# Patient Record
Sex: Female | Born: 1970
Health system: Southern US, Community
[De-identification: ages and names within clinical notes are randomized; demographics above are authoritative.]

## PROBLEM LIST (undated history)

## (undated) DIAGNOSIS — D332 Benign neoplasm of brain, unspecified: Secondary | ICD-10-CM

## (undated) DIAGNOSIS — I951 Orthostatic hypotension: Secondary | ICD-10-CM

## (undated) DIAGNOSIS — N920 Excessive and frequent menstruation with regular cycle: Secondary | ICD-10-CM

## (undated) DIAGNOSIS — G4700 Insomnia, unspecified: Secondary | ICD-10-CM

## (undated) DIAGNOSIS — E11319 Type 2 diabetes mellitus with unspecified diabetic retinopathy without macular edema: Secondary | ICD-10-CM

## (undated) DIAGNOSIS — M797 Fibromyalgia: Secondary | ICD-10-CM

## (undated) DIAGNOSIS — E118 Type 2 diabetes mellitus with unspecified complications: Secondary | ICD-10-CM

## (undated) DIAGNOSIS — E113593 Type 2 diabetes mellitus with proliferative diabetic retinopathy without macular edema, bilateral: Secondary | ICD-10-CM

## (undated) DIAGNOSIS — R140 Abdominal distension (gaseous): Secondary | ICD-10-CM

## (undated) DIAGNOSIS — H905 Unspecified sensorineural hearing loss: Secondary | ICD-10-CM

## (undated) DIAGNOSIS — D259 Leiomyoma of uterus, unspecified: Secondary | ICD-10-CM

## (undated) DIAGNOSIS — K9041 Non-celiac gluten sensitivity: Secondary | ICD-10-CM

## (undated) DIAGNOSIS — IMO0001 Reserved for inherently not codable concepts without codable children: Secondary | ICD-10-CM

## (undated) DIAGNOSIS — K760 Fatty (change of) liver, not elsewhere classified: Secondary | ICD-10-CM

## (undated) DIAGNOSIS — E782 Mixed hyperlipidemia: Secondary | ICD-10-CM

## (undated) DIAGNOSIS — I10 Essential (primary) hypertension: Secondary | ICD-10-CM

## (undated) DIAGNOSIS — H269 Unspecified cataract: Secondary | ICD-10-CM

## (undated) DIAGNOSIS — H35033 Hypertensive retinopathy, bilateral: Secondary | ICD-10-CM

## (undated) DIAGNOSIS — R600 Localized edema: Secondary | ICD-10-CM

## (undated) DIAGNOSIS — N946 Dysmenorrhea, unspecified: Secondary | ICD-10-CM

## (undated) DIAGNOSIS — H35039 Hypertensive retinopathy, unspecified eye: Secondary | ICD-10-CM

## (undated) HISTORY — DX: Mixed hyperlipidemia: E78.2

## (undated) HISTORY — DX: Benign neoplasm of brain, unspecified: D33.2

## (undated) HISTORY — PX: EYE SURGERY: SHX253

## (undated) HISTORY — DX: Hypertensive retinopathy, bilateral: H35.033

## (undated) HISTORY — DX: Unspecified cataract: H26.9

## (undated) HISTORY — DX: Orthostatic hypotension: I95.1

## (undated) HISTORY — DX: Fatty (change of) liver, not elsewhere classified: K76.0

## (undated) HISTORY — DX: Reserved for inherently not codable concepts without codable children: IMO0001

## (undated) HISTORY — DX: Leiomyoma of uterus, unspecified: D25.9

## (undated) HISTORY — DX: Hypertensive retinopathy, unspecified eye: H35.039

## (undated) HISTORY — DX: Type 2 diabetes mellitus with proliferative diabetic retinopathy without macular edema, bilateral: E11.3593

## (undated) HISTORY — DX: Localized edema: R60.0

## (undated) HISTORY — DX: Dysmenorrhea, unspecified: N94.6

## (undated) HISTORY — DX: Excessive and frequent menstruation with regular cycle: N92.0

## (undated) HISTORY — DX: Type 2 diabetes mellitus with unspecified complications: E11.8

## (undated) HISTORY — DX: Essential (primary) hypertension: I10

## (undated) HISTORY — DX: Non-celiac gluten sensitivity: K90.41

## (undated) HISTORY — DX: Abdominal distension (gaseous): R14.0

## (undated) HISTORY — DX: Unspecified sensorineural hearing loss: H90.5

## (undated) HISTORY — DX: Fibromyalgia: M79.7

---
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
## 1998-02-27 HISTORY — PX: CHOLECYSTECTOMY: SHX55

## 2004-11-25 ENCOUNTER — Other Ambulatory Visit: Admission: RE | Admit: 2004-11-25 | Discharge: 2004-11-25 | Payer: Self-pay | Admitting: Family Medicine

## 2004-11-25 ENCOUNTER — Ambulatory Visit: Payer: Self-pay | Admitting: Family Medicine

## 2005-01-04 ENCOUNTER — Ambulatory Visit: Payer: Self-pay | Admitting: Family Medicine

## 2005-02-07 ENCOUNTER — Encounter: Admission: RE | Admit: 2005-02-07 | Discharge: 2005-02-26 | Payer: Self-pay | Admitting: Family Medicine

## 2005-02-10 ENCOUNTER — Ambulatory Visit: Payer: Self-pay | Admitting: Family Medicine

## 2005-03-27 ENCOUNTER — Ambulatory Visit: Payer: Self-pay | Admitting: Family Medicine

## 2006-05-08 ENCOUNTER — Other Ambulatory Visit: Admission: RE | Admit: 2006-05-08 | Discharge: 2006-05-08 | Payer: Self-pay | Admitting: Family Medicine

## 2008-11-17 ENCOUNTER — Encounter: Admission: RE | Admit: 2008-11-17 | Discharge: 2008-11-17 | Payer: Self-pay | Admitting: Obstetrics & Gynecology

## 2015-01-18 ENCOUNTER — Ambulatory Visit (INDEPENDENT_AMBULATORY_CARE_PROVIDER_SITE_OTHER): Payer: 59 | Admitting: Family Medicine

## 2015-01-18 ENCOUNTER — Encounter: Payer: Self-pay | Admitting: Family Medicine

## 2015-01-18 VITALS — BP 132/89 | HR 99 | Temp 98.8°F | Resp 16 | Ht 64.0 in | Wt 154.5 lb

## 2015-01-18 DIAGNOSIS — H9192 Unspecified hearing loss, left ear: Secondary | ICD-10-CM | POA: Diagnosis not present

## 2015-01-18 DIAGNOSIS — Z23 Encounter for immunization: Secondary | ICD-10-CM

## 2015-01-18 DIAGNOSIS — E119 Type 2 diabetes mellitus without complications: Secondary | ICD-10-CM

## 2015-01-18 LAB — LDL CHOLESTEROL, DIRECT: Direct LDL: 165 mg/dL

## 2015-01-18 LAB — COMPREHENSIVE METABOLIC PANEL
ALK PHOS: 85 U/L (ref 39–117)
ALT: 20 U/L (ref 0–35)
AST: 13 U/L (ref 0–37)
Albumin: 4.3 g/dL (ref 3.5–5.2)
BILIRUBIN TOTAL: 0.3 mg/dL (ref 0.2–1.2)
BUN: 8 mg/dL (ref 6–23)
CO2: 24 mEq/L (ref 19–32)
Calcium: 9.5 mg/dL (ref 8.4–10.5)
Chloride: 100 mEq/L (ref 96–112)
Creatinine, Ser: 0.46 mg/dL (ref 0.40–1.20)
GFR: 155.33 mL/min (ref >60.00)
GLUCOSE: 294 mg/dL — AB (ref 70–99)
Potassium: 4.2 mEq/L (ref 3.5–5.1)
Sodium: 135 mEq/L (ref 135–145)
TOTAL PROTEIN: 6.8 g/dL (ref 6.0–8.3)

## 2015-01-18 LAB — TSH: TSH: 1.61 u[IU]/mL (ref 0.35–4.50)

## 2015-01-18 LAB — LIPID PANEL
CHOLESTEROL: 241 mg/dL — AB (ref 0–200)
HDL: 41.7 mg/dL (ref >39.00)
NONHDL: 199.77
TRIGLYCERIDES: 220 mg/dL — AB (ref 0.0–149.0)
Total CHOL/HDL Ratio: 6
VLDL: 44 mg/dL — ABNORMAL HIGH (ref 0.0–40.0)

## 2015-01-18 LAB — MICROALBUMIN / CREATININE URINE RATIO
Creatinine,U: 51.8 mg/dL
MICROALB/CREAT RATIO: 11.8 mg/g (ref 0.0–30.0)
Microalb, Ur: 6.1 mg/dL — ABNORMAL HIGH (ref 0.0–1.9)

## 2015-01-18 LAB — HEMOGLOBIN A1C: HEMOGLOBIN A1C: 13.3 % — AB (ref 4.6–6.5)

## 2015-01-18 MED ORDER — METFORMIN HCL 1000 MG PO TABS: 1000.0000 mg | ORAL_TABLET | Freq: Two times a day (BID) | ORAL | Status: DC

## 2015-01-18 NOTE — Progress Notes (Signed)
Pre visit review using our clinic review tool, if applicable. No additional management support is needed unless otherwise documented below in the visit note. 

## 2015-01-18 NOTE — Progress Notes (Signed)
Office Note 01/18/2015  CC:  Chief Complaint  Patient presents with  . Establish Care    Pt is fasting.   . Diabetes    out of medications  . Hearing Loss    left ear x 5 months   HPI:  Gloria Gomez is a 44 y.o. White female who is here to establish care. Patient's most recent primary MD: managed by a NP at her employer's: Theodore Demark, NP. Old records were not reviewed prior to or during today's visit.  Dx'd DM 2 approx 5 yrs ago, describes A1c 11 down to 8 at one time.  Seemingly lost to f/u, perhaps b/c managed by employer's NP and she switched jobs and had no further care from that person? No meds (see list below) in last 3 mo or so.  No home gluc monitoring.  Admits she is not active and doesn't follow diabetic diet.  Denies hx of any known retinal, renal, or neurologic or vascular complications of DM 2.  Reports acute onset L ear hearing loss about 6 mo ago, went to UC somewhere and dx'd with otitis externa, drops rx'd. No change in sx's so returned to UC a couple more times, each time seemingly with normal exam.  The final UC visit she was told after hearing test that she had 40% hearing loss in L ear, was told she may have meniere's dz.  She endorses L ear ringing intermittently but denies any hx of vertigo.  No hx of excessive noise exposure.  Reports most recent GYN care was at a place [facility] [place] but she cannot be more specific than this, says she has never had abnl pap or abnl mammogram.   Past Medical History  Diagnosis Date  . Diabetes mellitus without complication ([place])   . White coat hypertension     Past Surgical History  Procedure Laterality Date  . Cholecystectomy  2000    Family History  Problem Relation Age of Onset  . Brain cancer Mother   . Diabetes Father   . Diabetes Maternal Grandmother   . Cervical cancer Paternal Grandmother     Social History   Social History  . Marital Status: Married    Spouse Name: N/A  . Number  of Children: N/A  . Years of Education: N/A   Occupational History  . Not on file.   Social History Main Topics  . Smoking status: Never Smoker   . Smokeless tobacco: Never Used  . Alcohol Use: No  . Drug Use: No  . Sexual Activity: Not on file   Other Topics Concern  . Not on file   Social History Narrative   Married, no children..   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       Occup: [place] for [employer]   No T/A/Ds.    Outpatient Encounter Prescriptions as of 01/18/2015  Medication Sig  . metFORMIN (GLUCOPHAGE) 1000 MG tablet Take 1 tablet (1,000 mg total) by mouth 2 (two) times daily with a meal.  . [DISCONTINUED] glipiZIDE (GLUCOTROL) 5 MG tablet Take 5 mg by mouth daily before breakfast.  . [DISCONTINUED] metFORMIN (GLUCOPHAGE) 1000 MG tablet Take 1,000 mg by mouth daily with breakfast.   No facility-administered encounter medications on file as of 01/18/2015.    No Known Allergies  ROS Review of Systems  Constitutional: Negative for fever and fatigue.  HENT: Negative for congestion and sore throat.   Eyes: Negative for visual disturbance.  Respiratory:  Negative for cough.   Cardiovascular: Negative for chest pain.  Gastrointestinal: Negative for nausea and abdominal pain.  Genitourinary: Negative for dysuria.  Musculoskeletal: Negative for back pain and joint swelling.  Skin: Negative for rash.       Sparse scalp hair loss   Neurological: Negative for weakness and headaches.  Hematological: Negative for adenopathy.    PE; Blood pressure 132/89, pulse 99, temperature 98.8 F (37.1 C), temperature source Oral, resp. rate 16, height 5\' 4"  (1.626 m), weight 154 lb 8 oz (70.081 kg), last menstrual period 12/21/2014, SpO2 97 %. Gen: Alert, well appearing.  Patient is oriented to person, place, time, and situation. AFFECT: pleasant, lucid thought and speech. ENT: Ears: EACs clear, normal epithelium.  TMs with good light  reflex and landmarks bilaterally.  Eyes: no injection, icteris, swelling, or exudate.  EOMI, PERRLA. Nose: no drainage or turbinate edema/swelling.  No injection or focal lesion.  Mouth: lips without lesion/swelling.  Oral mucosa pink and moist.  Dentition intact and without obvious caries or gingival swelling.  Oropharynx without erythema, exudate, or swelling.  Neck - No masses or thyromegaly or limitation in range of motion CV: RRR, no m/r/g.   LUNGS: CTA bilat, nonlabored resps, good aeration in all lung fields. ABD: soft, NT, ND, BS normal.  No hepatospenomegaly or mass.  No bruits. EXT: no clubbing, cyanosis, or edema.   Pertinent labs:  None today  ASSESSMENT AND PLAN:   New pt; obtain old records if available.  1) DM 2, poor control/noncompliance. Get back on metformin 1000 mg bid, start once daily fasting glucose monitoring. Nutritionist referral today. Check A1c, urine microalb/cr today. Feet exam next visit.  Eye exam UTD. Also check TSH, CBC, CMET today.  2) Left ear hearing loss, chronic: referral made to audiology today.   Return in about 4 weeks (around 02/15/2015) for f/u DM 2.  She is due for routine cerv ca and breast ca screening and wishes to pursue care for this here instead of GYN, so we'll get this done in the near future as well.

## 2015-01-19 LAB — CBC WITH DIFFERENTIAL/PLATELET
Basophils Absolute: 0 10*3/uL (ref 0.0–0.1)
Basophils Relative: 0.4 % (ref 0.0–3.0)
Eosinophils Absolute: 0.1 10*3/uL (ref 0.0–0.7)
Eosinophils Relative: 1.3 % (ref 0.0–5.0)
HCT: 43.5 % (ref 36.0–46.0)
Hemoglobin: 14.4 g/dL (ref 12.0–15.0)
Lymphocytes Relative: 27.2 % (ref 12.0–46.0)
Lymphs Abs: 2.6 10*3/uL (ref 0.7–4.0)
MCHC: 33 g/dL (ref 30.0–36.0)
MCV: 86.2 fl (ref 78.0–100.0)
Monocytes Absolute: 0.5 10*3/uL (ref 0.1–1.0)
Monocytes Relative: 5.6 % (ref 3.0–12.0)
Neutro Abs: 6.3 10*3/uL (ref 1.4–7.7)
Neutrophils Relative %: 65.5 % (ref 43.0–77.0)
Platelets: 355 10*3/uL (ref 150.0–400.0)
RBC: 5.05 Mil/uL (ref 3.87–5.11)
RDW: 14.6 % (ref 11.5–15.5)
WBC: 9.6 10*3/uL (ref 4.0–10.5)

## 2015-02-01 NOTE — Progress Notes (Signed)
OK, continue metformin (make sure 1000 mg twice daily) and diet/exercise and we'll see what A1c shows in 3 mo.-thx

## 2015-02-03 ENCOUNTER — Encounter: Payer: Self-pay | Admitting: Family Medicine

## 2015-02-12 ENCOUNTER — Encounter: Payer: Self-pay | Admitting: Family Medicine

## 2015-02-12 ENCOUNTER — Other Ambulatory Visit (HOSPITAL_COMMUNITY)
Admission: RE | Admit: 2015-02-12 | Discharge: 2015-02-12 | Disposition: A | Discharge status: Home / Self Care | Payer: 59 | Source: Ambulatory Visit | Attending: Family Medicine | Admitting: Family Medicine

## 2015-02-12 ENCOUNTER — Ambulatory Visit (INDEPENDENT_AMBULATORY_CARE_PROVIDER_SITE_OTHER): Payer: 59 | Admitting: Family Medicine

## 2015-02-12 VITALS — BP 130/80 | HR 100 | Temp 98.0°F | Resp 20 | Wt 154.5 lb

## 2015-02-12 DIAGNOSIS — Z01419 Encounter for gynecological examination (general) (routine) without abnormal findings: Secondary | ICD-10-CM

## 2015-02-12 DIAGNOSIS — Z0000 Encounter for general adult medical examination without abnormal findings: Secondary | ICD-10-CM

## 2015-02-12 DIAGNOSIS — Z23 Encounter for immunization: Secondary | ICD-10-CM | POA: Diagnosis not present

## 2015-02-12 DIAGNOSIS — B3731 Acute candidiasis of vulva and vagina: Secondary | ICD-10-CM

## 2015-02-12 DIAGNOSIS — IMO0001 Reserved for inherently not codable concepts without codable children: Secondary | ICD-10-CM

## 2015-02-12 DIAGNOSIS — Z1151 Encounter for screening for human papillomavirus (HPV): Secondary | ICD-10-CM | POA: Diagnosis not present

## 2015-02-12 DIAGNOSIS — Z124 Encounter for screening for malignant neoplasm of cervix: Secondary | ICD-10-CM

## 2015-02-12 DIAGNOSIS — E1165 Type 2 diabetes mellitus with hyperglycemia: Secondary | ICD-10-CM

## 2015-02-12 DIAGNOSIS — Z1239 Encounter for other screening for malignant neoplasm of breast: Secondary | ICD-10-CM | POA: Diagnosis not present

## 2015-02-12 DIAGNOSIS — B373 Candidiasis of vulva and vagina: Secondary | ICD-10-CM

## 2015-02-12 MED ORDER — FLUCONAZOLE 150 MG PO TABS: 150.0000 mg | ORAL_TABLET | Freq: Once | ORAL | Status: DC

## 2015-02-12 NOTE — Progress Notes (Signed)
Subjective:    Patient ID: Gloria Gomez, female    DOB: 1970-08-25, 44 y.o.   MRN: [id number]  HPI   Well women: Patient presents for an office visit today for scheduled gynecological exam with cervical cancer screening. Patient states that she has a history of uterine fibroid by ultrasound. She reports this was found after approximately 2 and half years ago her menstrual cycles have changed became heavy. Patient was offered an ablation procedure, but is unsure if this is something she would like to do. Patient states currently her menstrual cycles approximately every 5 weeks, and this is normal for her. Her menstrual cycles last approximately 5 days, with 2-3 days of that being a heavy menstrual period. Patient continues to be followed by gynecology for this condition. She is married, with no children. There is no history of breast cancer. She does have a maternal grandmother that had cervical cancer, her mother had brain cancer, and her grandfather had colon cancer in his 51s. Patient states that she has had a yeast infection. Weeks ago, and completed over-the-counter treatment. However she has noticed that her symptoms have returned with vaginal itching and white creamy discharge. Patient is an uncontrolled diabetic. She has recently been started back on her metformin.  Past Medical History  Diagnosis Date  . Diabetes mellitus without complication ([place])   . White coat hypertension   . Sensorineural hearing loss of left ear     Sudden left hearing loss summer 2016   Allergies  Allergen Reactions  . Gluten Meal Swelling   Social History   Social History  . Marital Status: Married    Spouse Name: N/A  . Number of Children: N/A  . Years of Education: N/A   Occupational History  . Not on file.   Social History Main Topics  . Smoking status: Never Smoker   . Smokeless tobacco: Never Used  . Alcohol Use: No  . Drug Use: No  . Sexual Activity: Not on file   Other Topics Concern    . Not on file   Social History Narrative   Married, no children..   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       Occup: [place] for [employer]   No T/A/Ds.    Review of Systems Negative, with the exception of above mentioned in HPI      Objective:   Physical Exam BP 130/80 mmHg  Pulse 100  Temp(Src) 98 F (36.7 C) (Oral)  Resp 20  Wt 154 lb 8 oz (70.081 kg)  SpO2 100%  LMP 01/20/2015  Body mass index is 26.51 kg/(m^2). Gen: Afebrile. No acute distress. Nontoxic in appearance, well-developed, well-nourished, Caucasian female. HENT: AT. [place]. Bilateral TM visualized and normal in appearance. MMM.  Eyes:Pupils Equal Round Reactive to light, Extraocular movements intact,  Conjunctiva without redness, discharge or icterus. Neck/lymp/endocrine: Supple, no lymphadenopathy, no thyromegaly CV: RRR no murmurs clicks gallops or rubs, no edema, +2/4 P posterior tibialis pulses Chest: CTAB, no wheeze or crackles Abd: Soft. Flat. NTND. BS present. No Masses palpated. No rebound or guarding.  Breasts: breasts appear normal, symmetrical, no tenderness on exam, no suspicious masses, no skin or nipple changes or axillary nodes. GYN:  External genitalia with erythema and excoriation, normal hair distribution, no lesions. Urethral meatus normal, no lesions. Vaginal mucosa pink, moist, normal rugae, no lesions. No cystocele or rectocele. Mildly friable cervix without lesions, moderate white creamy discharge,  no bleeding noted on speculum exam.  Bimanual exam revealed mildly enlarged uterus.  No bladder/suprapubic fullness, masses or tenderness. No cervical motion tenderness. No adnexal tenderness. Anus and perineum within normal limits, no lesions.     Assessment & Plan:  Gloria Gomez is a 44 y.o. female presents for gynecological exam with PAP.  1. Breast cancer screening - Patient reports last mammogram greater than 2 years ago, reports are normal mammograms.  Family history of cervical cancer, no breast cancer known in the family. - MM DIGITAL SCREENING BILATERAL; Future  2. Need for prophylactic vaccination with combined diphtheria-tetanus-pertussis (DTP) vaccine - Tdap vaccine greater than or equal to 7yo IM  3. Routine health maintenance/cervical cancer screening/routine gynecological exam - No known abnormal Paps.  - Cytology - PAP with HPV.  4.Vaginal candidiasis - Signs and symptoms of yeast infection on exam today. Patient treated with Diflucan 150 mg, with repeat treatment in 3 days. - Discussed with patient her control her diabetes/blood glucose will decrease her incidences of yeast infections. If he states infection isn't resolved within 1 week will need to see again. - Pap smear was sent today  5. uncontrolled type 2 diabetes mellitus without complication, without long-term current use of insulin ([place]) - Briefly discussed today the need for tighter control on her diabetes. Patient has follow-up with her PCP within a couple weeks to discuss blood sugars. - Patient will need to be offered pneumonia vaccine secondary to uncontrolled diabetes.

## 2015-02-12 NOTE — Patient Instructions (Signed)
We will call you once we receive your PAP results.  I have called in diflucan to treat yeast. Take one pill today and then repeat in 3 days. Tighter diabetic control with help decrease the frequency of your yeast infections.  Make certain to follow up with PCP for Diabetes management as scheduled I have also ordered your mammogram, we will attempt to get this scheduled prior to Dec 30th for you. You have received your TDAP today (tetanus vaccination)  Td Vaccine (Tetanus and Diphtheria): What You Need to Know 1. Why get vaccinated? Tetanus  and diphtheria are very serious diseases. They are rare in the [place] today, but people who do become infected often have severe complications. Td vaccine is used to protect adolescents and adults from both of these diseases. Both tetanus and diphtheria are infections caused by bacteria. Diphtheria spreads from person to person through coughing or sneezing. Tetanus-causing bacteria enter the body through cuts, scratches, or wounds. TETANUS (Lockjaw) causes painful muscle tightening and stiffness, usually all over the body.  It can lead to tightening of muscles in the head and neck so you can't open your mouth, swallow, or sometimes even breathe. Tetanus kills about 1 out of every 10 people who are infected even after receiving the best medical care. DIPHTHERIA can cause a thick coating to form in the back of the throat.  It can lead to breathing problems, paralysis, heart failure, and death. Before vaccines, as many as 200,000 cases of diphtheria and hundreds of cases of tetanus were reported in the [place] each year. Since vaccination began, reports of cases for both diseases have dropped by about 99%. 2. Td vaccine Td vaccine can protect adolescents and adults from tetanus and diphtheria. Td is usually given as a booster dose every 10 years but it can also be given earlier after a severe and dirty wound or burn. Another vaccine, called Tdap,  which protects against pertussis in addition to tetanus and diphtheria, is sometimes recommended instead of Td vaccine. Your doctor or the person giving you the vaccine can give you more information. Td may safely be given at the same time as other vaccines. 3. Some people should not get this vaccine  A person who has ever had a life-threatening allergic reaction after a previous dose of any tetanus or diphtheria containing vaccine, OR has a severe allergy to any part of this vaccine, should not get Td vaccine. Tell the person giving the vaccine about any severe allergies.  Talk to your doctor if you:  have seizures or another nervous system problem,  had severe pain or swelling after any vaccine containing diphtheria or tetanus,  ever had a condition called Guillain Barre Syndrome (GBS),  aren't feeling well on the day the shot is scheduled. 4. Risks of a vaccine reaction With any medicine, including vaccines, there is a chance of side effects. These are usually mild and go away on their own. Serious reactions are also possible but are rare. Most people who get Td vaccine do not have any problems with it. Mild Problems  following Td vaccine: (Did not interfere with activities)  Pain where the shot was given (about 8 people in 10)  Redness or swelling where the shot was given (about 1 person in 4)  Mild fever (rare)  Headache (about 1 person in 4)  Tiredness (about 1 person in 4) Moderate Problems following Td vaccine: (Interfered with activities, but did not require medical attention)  Fever over 102F (rare)  Severe Problems  following Td vaccine: (Unable to perform usual activities; required medical attention)  Swelling, severe pain, bleeding and/or redness in the arm where the shot was given (rare). Problems that could happen after any vaccine:  People sometimes faint after a medical procedure, including vaccination. Sitting or lying down for about 15 minutes can help  prevent fainting, and injuries caused by a fall. Tell your doctor if you feel dizzy, or have vision changes or ringing in the ears.  Some people get severe pain in the shoulder and have difficulty moving the arm where a shot was given. This happens very rarely.  Any medication can cause a severe allergic reaction. Such reactions from a vaccine are very rare, estimated at fewer than 1 in a million doses, and would happen within a few minutes to a few hours after the vaccination. As with any medicine, there is a very remote chance of a vaccine causing a serious injury or death. The safety of vaccines is always being monitored. For more information, visit: http://www.aguilar.org/ 5. What if there is a serious reaction? What should I look for?  Look for anything that concerns you, such as signs of a severe allergic reaction, very high fever, or unusual behavior. Signs of a severe allergic reaction can include hives, swelling of the face and throat, difficulty breathing, a fast heartbeat, dizziness, and weakness. These would usually start a few minutes to a few hours after the vaccination. What should I do?  If you think it is a severe allergic reaction or other emergency that can't wait, call 9-1-1 or get the person to the nearest hospital. Otherwise, call your doctor.  Afterward, the reaction should be reported to the Vaccine Adverse Event Reporting System (VAERS). Your doctor might file this report, or you can do it yourself through the VAERS web site at www.vaers.SamedayNews.es, or by calling 450-238-7073. VAERS does not give medical advice. 6. The National Vaccine Injury Compensation Program The [employer] Vaccine Injury Compensation Program (VICP) is a federal program that was created to compensate people who may have been injured by certain vaccines. Persons who believe they may have been injured by a vaccine can learn about the program and about filing a claim by calling 2363973096 or visiting  the [place] website at GoldCloset.com.ee. There is a time limit to file a claim for compensation. 7. How can I learn more?  Ask your doctor. He or she can give you the vaccine package insert or suggest other sources of information.  Call your local or state health department.  Contact the Centers for Disease Control and Prevention (CDC):  Call 334-285-0888 (1-800-CDC-INFO)  Visit CDC's website at http://hunter.com/ CDC Td Vaccine VIS (04/22/13)   This information is not intended to replace advice given to you by your health care provider. Make sure you discuss any questions you have with your health care provider.   Document Released: 12/11/2005 Document Revised: 03/06/2014 Document Reviewed: 05/28/2013 Elsevier Interactive Patient Education 2016 [place] Maintenance, Female Adopting a healthy lifestyle and getting preventive care can go a long way to promote health and wellness. Talk with your health care provider about what schedule of regular examinations is right for you. This is a good chance for you to check in with your provider about disease prevention and staying healthy. In between checkups, there are plenty of things you can do on your own. Experts have done a lot of research about which lifestyle changes and preventive measures are most likely to keep  you healthy. Ask your health care provider for more information. WEIGHT AND DIET  Eat a healthy diet  Be sure to include plenty of vegetables, fruits, low-fat dairy products, and lean protein.  Do not eat a lot of foods high in solid fats, added sugars, or salt.  Get regular exercise. This is one of the most important things you can do for your health.  Most adults should exercise for at least 150 minutes each week. The exercise should increase your heart rate and make you sweat (moderate-intensity exercise).  Most adults should also do strengthening exercises at least twice a week. This is in  addition to the moderate-intensity exercise.  Maintain a healthy weight  Body mass index (BMI) is a measurement that can be used to identify possible weight problems. It estimates body fat based on height and weight. Your health care provider can help determine your BMI and help you achieve or maintain a healthy weight.  For females 19 years of age and older:   A BMI below 18.5 is considered underweight.  A BMI of 18.5 to 24.9 is normal.  A BMI of 25 to 29.9 is considered overweight.  A BMI of 30 and above is considered obese.  Watch levels of cholesterol and blood lipids  You should start having your blood tested for lipids and cholesterol at 46 years of age, then have this test every 5 years.  You may need to have your cholesterol levels checked more often if:  Your lipid or cholesterol levels are high.  You are older than 46 years of age.  You are at high risk for heart disease.  CANCER SCREENING   Lung Cancer  Lung cancer screening is recommended for adults 91-42 years old who are at high risk for lung cancer because of a history of smoking.  A yearly low-dose CT scan of the lungs is recommended for people who:  Currently smoke.  Have quit within the past 15 years.  Have at least a 30-pack-year history of smoking. A pack year is smoking an average of one pack of cigarettes a day for 1 year.  Yearly screening should continue until it has been 15 years since you quit.  Yearly screening should stop if you develop a health problem that would prevent you from having lung cancer treatment.  Breast Cancer  Practice breast self-awareness. This means understanding how your breasts normally appear and feel.  It also means doing regular breast self-exams. Let your health care provider know about any changes, no matter how small.  If you are in your 20s or 30s, you should have a clinical breast exam (CBE) by a health care provider every 1-3 years as part of a regular  health exam.  If you are 30 or older, have a CBE every year. Also consider having a breast X-ray (mammogram) every year.  If you have a family history of breast cancer, talk to your health care provider about genetic screening.  If you are at high risk for breast cancer, talk to your health care provider about having an MRI and a mammogram every year.  Breast cancer gene (BRCA) assessment is recommended for women who have family members with BRCA-related cancers. BRCA-related cancers include:  Breast.  Ovarian.  Tubal.  Peritoneal cancers.  Results of the assessment will determine the need for genetic counseling and BRCA1 and BRCA2 testing. Cervical Cancer Your health care provider may recommend that you be screened regularly for cancer of the pelvic organs (ovaries,  uterus, and vagina). This screening involves a pelvic examination, including checking for microscopic changes to the surface of your cervix (Pap test). You may be encouraged to have this screening done every 3 years, beginning at age 34.  For women ages 61-65, health care providers may recommend pelvic exams and Pap testing every 3 years, or they may recommend the Pap and pelvic exam, combined with testing for human papilloma virus (HPV), every 5 years. Some types of HPV increase your risk of cervical cancer. Testing for HPV may also be done on women of any age with unclear Pap test results.  Other health care providers may not recommend any screening for nonpregnant women who are considered low risk for pelvic cancer and who do not have symptoms. Ask your health care provider if a screening pelvic exam is right for you.  If you have had past treatment for cervical cancer or a condition that could lead to cancer, you need Pap tests and screening for cancer for at least 20 years after your treatment. If Pap tests have been discontinued, your risk factors (such as having a new sexual partner) need to be reassessed to determine if  screening should resume. Some women have medical problems that increase the chance of getting cervical cancer. In these cases, your health care provider may recommend more frequent screening and Pap tests. Colorectal Cancer  This type of cancer can be detected and often prevented.  Routine colorectal cancer screening usually begins at 46 years of age and continues through 46 years of age.  Your health care provider may recommend screening at an earlier age if you have risk factors for colon cancer.  Your health care provider may also recommend using home test kits to check for hidden blood in the stool.  A small camera at the end of a tube can be used to examine your colon directly (sigmoidoscopy or colonoscopy). This is done to check for the earliest forms of colorectal cancer.  Routine screening usually begins at age 52.  Direct examination of the colon should be repeated every 5-10 years through 46 years of age. However, you may need to be screened more often if early forms of precancerous polyps or small growths are found. Skin Cancer  Check your skin from head to toe regularly.  Tell your health care provider about any new moles or changes in moles, especially if there is a change in a mole's shape or color.  Also tell your health care provider if you have a mole that is larger than the size of a pencil eraser.  Always use sunscreen. Apply sunscreen liberally and repeatedly throughout the day.  Protect yourself by wearing long sleeves, pants, a wide-brimmed hat, and sunglasses whenever you are outside. HEART DISEASE, DIABETES, AND HIGH BLOOD PRESSURE   High blood pressure causes heart disease and increases the risk of stroke. High blood pressure is more likely to develop in:  People who have blood pressure in the high end of the normal range (130-139/85-89 mm Hg).  People who are overweight or obese.  People who are African American.  If you are 79-30 years of age, have your  blood pressure checked every 3-5 years. If you are 49 years of age or older, have your blood pressure checked every year. You should have your blood pressure measured twice--once when you are at a hospital or clinic, and once when you are not at a hospital or clinic. Record the average of the two measurements. To check  your blood pressure when you are not at a hospital or clinic, you can use:  An automated blood pressure machine at a pharmacy.  A home blood pressure monitor.  If you are between 30 years and 81 years old, ask your health care provider if you should take aspirin to prevent strokes.  Have regular diabetes screenings. This involves taking a blood sample to check your fasting blood sugar level.  If you are at a normal weight and have a low risk for diabetes, have this test once every three years after 46 years of age.  If you are overweight and have a high risk for diabetes, consider being tested at a younger age or more often. PREVENTING INFECTION  Hepatitis B  If you have a higher risk for hepatitis B, you should be screened for this virus. You are considered at high risk for hepatitis B if:  You were born in a country where hepatitis B is common. Ask your health care provider which countries are considered high risk.  Your parents were born in a high-risk country, and you have not been immunized against hepatitis B (hepatitis B vaccine).  You have HIV or AIDS.  You use needles to inject street drugs.  You live with someone who has hepatitis B.  You have had sex with someone who has hepatitis B.  You get hemodialysis treatment.  You take certain medicines for conditions, including cancer, organ transplantation, and autoimmune conditions. Hepatitis C  Blood testing is recommended for:  Everyone born from [birth year] through 1967.  Anyone with known risk factors for hepatitis C. Sexually transmitted infections (STIs)  You should be screened for sexually transmitted  infections (STIs) including gonorrhea and chlamydia if:  You are sexually active and are younger than 46 years of age.  You are older than 46 years of age and your health care provider tells you that you are at risk for this type of infection.  Your sexual activity has changed since you were last screened and you are at an increased risk for chlamydia or gonorrhea. Ask your health care provider if you are at risk.  If you do not have HIV, but are at risk, it may be recommended that you take a prescription medicine daily to prevent HIV infection. This is called pre-exposure prophylaxis (PrEP). You are considered at risk if:  You are sexually active and do not regularly use condoms or know the HIV status of your partner(s).  You take drugs by injection.  You are sexually active with a partner who has HIV. Talk with your health care provider about whether you are at high risk of being infected with HIV. If you choose to begin PrEP, you should first be tested for HIV. You should then be tested every 3 months for as long as you are taking PrEP.  PREGNANCY   If you are premenopausal and you may become pregnant, ask your health care provider about preconception counseling.  If you may become pregnant, take 400 to 800 micrograms (mcg) of folic acid every day.  If you want to prevent pregnancy, talk to your health care provider about birth control (contraception). OSTEOPOROSIS AND MENOPAUSE   Osteoporosis is a disease in which the bones lose minerals and strength with aging. This can result in serious bone fractures. Your risk for osteoporosis can be identified using a bone density scan.  If you are 49 years of age or older, or if you are at risk for osteoporosis and fractures, ask  your health care provider if you should be screened.  Ask your health care provider whether you should take a calcium or vitamin D supplement to lower your risk for osteoporosis.  Menopause may have certain physical  symptoms and risks.  Hormone replacement therapy may reduce some of these symptoms and risks. Talk to your health care provider about whether hormone replacement therapy is right for you.  HOME CARE INSTRUCTIONS   Schedule regular health, dental, and eye exams.  Stay current with your immunizations.   Do not use any tobacco products including cigarettes, chewing tobacco, or electronic cigarettes.  If you are pregnant, do not drink alcohol.  If you are breastfeeding, limit how much and how often you drink alcohol.  Limit alcohol intake to no more than 1 drink per day for nonpregnant women. One drink equals 12 ounces of beer, 5 ounces of wine, or 1 ounces of hard liquor.  Do not use street drugs.  Do not share needles.  Ask your health care provider for help if you need support or information about quitting drugs.  Tell your health care provider if you often feel depressed.  Tell your health care provider if you have ever been abused or do not feel safe at home.   This information is not intended to replace advice given to you by your health care provider. Make sure you discuss any questions you have with your health care provider.   Document Released: 08/29/2010 Document Revised: 03/06/2014 Document Reviewed: 01/15/2013 Elsevier Interactive Patient Education [employer].

## 2015-02-15 ENCOUNTER — Ambulatory Visit (INDEPENDENT_AMBULATORY_CARE_PROVIDER_SITE_OTHER): Payer: 59 | Admitting: Family Medicine

## 2015-02-15 ENCOUNTER — Encounter: Payer: Self-pay | Admitting: Family Medicine

## 2015-02-15 VITALS — BP 133/90 | HR 102 | Temp 98.3°F | Resp 16 | Ht 64.0 in | Wt 154.8 lb

## 2015-02-15 DIAGNOSIS — E119 Type 2 diabetes mellitus without complications: Secondary | ICD-10-CM | POA: Diagnosis not present

## 2015-02-15 DIAGNOSIS — Z23 Encounter for immunization: Secondary | ICD-10-CM | POA: Diagnosis not present

## 2015-02-15 DIAGNOSIS — E785 Hyperlipidemia, unspecified: Secondary | ICD-10-CM

## 2015-02-15 MED ORDER — ATORVASTATIN CALCIUM 40 MG PO TABS: 40.0000 mg | ORAL_TABLET | Freq: Every day | ORAL | Status: DC

## 2015-02-15 NOTE — Addendum Note (Signed)
Addended by: Lanae Crumbly on: 02/15/2015 11:33 AM   Modules accepted: Orders

## 2015-02-15 NOTE — Progress Notes (Signed)
Pre visit review using our clinic review tool, if applicable. No additional management support is needed unless otherwise documented below in the visit note. 

## 2015-02-15 NOTE — Progress Notes (Signed)
OFFICE VISIT  02/15/2015   CC:  Chief Complaint  Patient presents with  . Follow-up    DM. Pt is not fasting.    HPI:    Patient is a 44 y.o. Caucasian female who presents for 4 wk f/u DM 2.   Last visit got back on metformin 1000 mg bid and was referred to nutritionist.  Has appt with nutritionist. Started exercising in the last 10d or so. Checks fasting gluc qd and they are still 250 or greater.  She is adamant that continuing with diet and getting more thorough with her exercise will allow her to get her fastings to around 100-110.    She is still taking some steroids rx'd by ENT for sudden sensorineural hearing loss--notes no change in hearing but noted her glucoses go up as expected.  Discussed use of statin in diabetics over age 65, esp those with elevated cholesterol levels like she has, and she was agreeable to starting atorvastatin 40mg  qd today.  Also discussed importance of getting pneumovax 23 given her dx of DM 2.  Past Medical History  Diagnosis Date  . Diabetes mellitus without complication ([place])   . White coat hypertension   . Sensorineural hearing loss of left ear     Sudden left hearing loss summer 2016    Past Surgical History  Procedure Laterality Date  . Cholecystectomy  2000    Outpatient Prescriptions Prior to Visit  Medication Sig Dispense Refill  . metFORMIN (GLUCOPHAGE) 1000 MG tablet Take 1 tablet (1,000 mg total) by mouth 2 (two) times daily with a meal. 60 tablet 11  . fluconazole (DIFLUCAN) 150 MG tablet Take 1 tablet (150 mg total) by mouth once. (Patient not taking: Reported on 02/15/2015) 2 tablet 0   No facility-administered medications prior to visit.    Allergies  Allergen Reactions  . Gluten Meal Swelling    ROS As per HPI  PE: Blood pressure 133/90, pulse 102, temperature 98.3 F (36.8 C), temperature source Oral, resp. rate 16, height 5\' 4"  (1.626 m), weight 154 lb 12 oz (70.194 kg), last menstrual period 01/20/2015, SpO2  95 %. Gen: Alert, well appearing.  Patient is oriented to person, place, time, and situation. AFFECT: pleasant, lucid thought and speech. No further exam today.  LABS:  Lab Results  Component Value Date   HGBA1C 13.3* 01/18/2015     Chemistry      Component Value Date/Time   NA 135 01/18/2015 1056   K 4.2 01/18/2015 1056   CL 100 01/18/2015 1056   CO2 24 01/18/2015 1056   BUN 8 01/18/2015 1056   CREATININE 0.46 01/18/2015 1056      Component Value Date/Time   CALCIUM 9.5 01/18/2015 1056   ALKPHOS 85 01/18/2015 1056   AST 13 01/18/2015 1056   ALT 20 01/18/2015 1056   BILITOT 0.3 01/18/2015 1056     Lab Results  Component Value Date   CHOL 241* 01/18/2015   HDL 41.70 01/18/2015   LDLDIRECT 165.0 01/18/2015   TRIG 220.0* 01/18/2015   CHOLHDL 6 01/18/2015     IMPRESSION AND PLAN:  1) DM 2, glucoses improving slightly on full dose metformin and getting back into exercise. Pt wants to continue only this for now---I had recommended insulin after A1c came back a month ago but she declined this approach. Pneumovax 23 given today.  2) Hyperlipidemia: start atorvastatin 40mg  qd today.  3) Sudden sensorineural hearing loss L ear: she is being followed by an ENT and  is currently in the midst of a systemic steroid trial: no sign this is helping.  An After Visit Summary was printed and given to the patient.  FOLLOW UP: Return in about 4 months (around 06/16/2015) for routine chronic illness f/u--lab visit in 2 mo for HbA1c and fasting lipids.

## 2015-02-16 ENCOUNTER — Telehealth: Payer: Self-pay | Admitting: Family Medicine

## 2015-02-16 LAB — CYTOLOGY - PAP

## 2015-02-16 NOTE — Telephone Encounter (Signed)
Please call pt: - Her PAP had no signs of malignancy and no HPV.  - she will need a PAP again next year and then hopefully we can space tham out after.

## 2015-02-17 NOTE — Telephone Encounter (Signed)
Left message with lab results and instructions on patient voice mail 

## 2015-02-18 ENCOUNTER — Encounter: Payer: Self-pay | Admitting: Family Medicine

## 2015-02-18 ENCOUNTER — Other Ambulatory Visit: Payer: Self-pay | Admitting: Otolaryngology

## 2015-02-18 DIAGNOSIS — H9122 Sudden idiopathic hearing loss, left ear: Secondary | ICD-10-CM

## 2015-02-18 DIAGNOSIS — H9042 Sensorineural hearing loss, unilateral, left ear, with unrestricted hearing on the contralateral side: Secondary | ICD-10-CM

## 2015-02-23 ENCOUNTER — Ambulatory Visit
Admission: RE | Admit: 2015-02-23 | Discharge: 2015-02-23 | Disposition: A | Discharge status: Home / Self Care | Payer: 59 | Source: Ambulatory Visit | Attending: Family Medicine | Admitting: Family Medicine

## 2015-02-23 DIAGNOSIS — Z1239 Encounter for other screening for malignant neoplasm of breast: Secondary | ICD-10-CM

## 2015-02-27 ENCOUNTER — Ambulatory Visit
Admission: RE | Admit: 2015-02-27 | Discharge: 2015-02-27 | Disposition: A | Discharge status: Home / Self Care | Payer: 59 | Source: Ambulatory Visit | Attending: Otolaryngology | Admitting: Otolaryngology

## 2015-02-27 DIAGNOSIS — H9122 Sudden idiopathic hearing loss, left ear: Secondary | ICD-10-CM

## 2015-02-27 DIAGNOSIS — H9042 Sensorineural hearing loss, unilateral, left ear, with unrestricted hearing on the contralateral side: Secondary | ICD-10-CM

## 2015-02-27 MED ORDER — GADOBENATE DIMEGLUMINE 529 MG/ML IV SOLN
14.0000 mL | Freq: Once | INTRAVENOUS | Status: AC | PRN
  Administered 2015-02-27: 14 mL via INTRAVENOUS

## 2015-04-15 ENCOUNTER — Telehealth: Payer: Self-pay | Admitting: *Deleted

## 2015-04-15 DIAGNOSIS — E119 Type 2 diabetes mellitus without complications: Secondary | ICD-10-CM

## 2015-04-15 DIAGNOSIS — E785 Hyperlipidemia, unspecified: Secondary | ICD-10-CM

## 2015-04-15 NOTE — Telephone Encounter (Signed)
-----   Message from Nickola Major sent at 04/15/2015  2:57 PM EST ----- Please put in lab orders for patient to go to [facility] lab. She is going early in April. Thanks

## 2015-04-15 NOTE — Telephone Encounter (Signed)
Please advise, which labs and Dx. Thanks.

## 2015-04-16 ENCOUNTER — Other Ambulatory Visit: Payer: 59

## 2015-04-30 ENCOUNTER — Telehealth: Payer: Self-pay | Admitting: *Deleted

## 2015-04-30 MED ORDER — FLUCONAZOLE 150 MG PO TABS: 150.0000 mg | ORAL_TABLET | Freq: Once | ORAL | Status: DC

## 2015-04-30 NOTE — Telephone Encounter (Signed)
Please call pt: - I will refill diflucan. However, she will need to make an appt if not improved. Repeat yeast infections are likely from her uncontrolled diabetes.

## 2015-04-30 NOTE — Telephone Encounter (Signed)
Patient called and left message requesting something for vaginal yeast infection. Patient states she has been using OTC meds with no results. 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 Last seen by Dr Raoul Pitch 02/12/15.

## 2015-05-03 NOTE — Telephone Encounter (Signed)
Spoke with patient reviewed information and instructions. 

## 2015-05-10 ENCOUNTER — Encounter: Payer: Self-pay | Admitting: Family Medicine

## 2015-05-18 NOTE — Telephone Encounter (Signed)
Left detailed message on cell vm, okay per DPR.  

## 2015-05-18 NOTE — Telephone Encounter (Signed)
Orders entered. Pt needs to be fasting for these labs.-thx

## 2015-06-16 ENCOUNTER — Ambulatory Visit: Payer: 59 | Admitting: Family Medicine

## 2015-08-24 ENCOUNTER — Other Ambulatory Visit: Payer: Self-pay | Admitting: Neurosurgery

## 2015-08-24 DIAGNOSIS — D330 Benign neoplasm of brain, supratentorial: Secondary | ICD-10-CM

## 2015-09-09 ENCOUNTER — Ambulatory Visit
Admission: RE | Admit: 2015-09-09 | Discharge: 2015-09-09 | Disposition: A | Discharge status: Home / Self Care | Payer: BLUE CROSS/BLUE SHIELD | Source: Ambulatory Visit | Attending: Neurosurgery | Admitting: Neurosurgery

## 2015-09-09 DIAGNOSIS — G9389 Other specified disorders of brain: Secondary | ICD-10-CM | POA: Diagnosis not present

## 2015-09-09 DIAGNOSIS — D330 Benign neoplasm of brain, supratentorial: Secondary | ICD-10-CM

## 2015-09-09 MED ORDER — GADOBENATE DIMEGLUMINE 529 MG/ML IV SOLN
15.0000 mL | Freq: Once | INTRAVENOUS | Status: AC | PRN
  Administered 2015-09-09: 15 mL via INTRAVENOUS

## 2015-09-23 DIAGNOSIS — D330 Benign neoplasm of brain, supratentorial: Secondary | ICD-10-CM | POA: Diagnosis not present

## 2016-03-02 ENCOUNTER — Other Ambulatory Visit: Payer: Self-pay | Admitting: Neurosurgery

## 2016-03-02 DIAGNOSIS — D330 Benign neoplasm of brain, supratentorial: Secondary | ICD-10-CM

## 2016-03-07 LAB — HM DIABETES EYE EXAM

## 2016-03-20 ENCOUNTER — Ambulatory Visit
Admission: RE | Admit: 2016-03-20 | Discharge: 2016-03-20 | Disposition: A | Discharge status: Home / Self Care | Payer: BLUE CROSS/BLUE SHIELD | Source: Ambulatory Visit | Attending: Neurosurgery | Admitting: Neurosurgery

## 2016-03-20 DIAGNOSIS — D330 Benign neoplasm of brain, supratentorial: Secondary | ICD-10-CM

## 2016-03-20 DIAGNOSIS — G9389 Other specified disorders of brain: Secondary | ICD-10-CM | POA: Diagnosis not present

## 2016-03-20 MED ORDER — GADOBENATE DIMEGLUMINE 529 MG/ML IV SOLN
14.0000 mL | Freq: Once | INTRAVENOUS | Status: AC | PRN
  Administered 2016-03-20: 14 mL via INTRAVENOUS

## 2016-03-29 DIAGNOSIS — D330 Benign neoplasm of brain, supratentorial: Secondary | ICD-10-CM | POA: Diagnosis not present

## 2016-07-19 ENCOUNTER — Ambulatory Visit (INDEPENDENT_AMBULATORY_CARE_PROVIDER_SITE_OTHER): Payer: Self-pay | Admitting: Family Medicine

## 2016-07-19 ENCOUNTER — Encounter: Payer: Self-pay | Admitting: Family Medicine

## 2016-07-19 VITALS — BP 143/88 | HR 102 | Temp 97.9°F | Resp 16 | Wt 153.0 lb

## 2016-07-19 DIAGNOSIS — E782 Mixed hyperlipidemia: Secondary | ICD-10-CM

## 2016-07-19 DIAGNOSIS — D331 Benign neoplasm of brain, infratentorial: Secondary | ICD-10-CM

## 2016-07-19 DIAGNOSIS — E119 Type 2 diabetes mellitus without complications: Secondary | ICD-10-CM

## 2016-07-19 DIAGNOSIS — R030 Elevated blood-pressure reading, without diagnosis of hypertension: Secondary | ICD-10-CM

## 2016-07-19 LAB — COMPREHENSIVE METABOLIC PANEL
ALBUMIN: 4.5 g/dL (ref 3.5–5.2)
ALT: 55 U/L — ABNORMAL HIGH (ref 0–35)
AST: 35 U/L (ref 0–37)
Alkaline Phosphatase: 68 U/L (ref 39–117)
BUN: 15 mg/dL (ref 6–23)
CO2: 22 mEq/L (ref 19–32)
Calcium: 9.6 mg/dL (ref 8.4–10.5)
Chloride: 106 mEq/L (ref 96–112)
Creatinine, Ser: 0.47 mg/dL (ref 0.40–1.20)
GFR: 150.54 mL/min (ref >60.00)
Glucose, Bld: 196 mg/dL — ABNORMAL HIGH (ref 70–99)
Potassium: 4.4 mEq/L (ref 3.5–5.1)
Sodium: 139 mEq/L (ref 135–145)
TOTAL PROTEIN: 6.9 g/dL (ref 6.0–8.3)
Total Bilirubin: 0.3 mg/dL (ref 0.2–1.2)

## 2016-07-19 LAB — LIPID PANEL
Cholesterol: 151 mg/dL (ref 0–200)
HDL: 34.5 mg/dL — AB (ref >39.00)
NonHDL: 116.76
TRIGLYCERIDES: 276 mg/dL — AB (ref 0.0–149.0)
Total CHOL/HDL Ratio: 4
VLDL: 55.2 mg/dL — ABNORMAL HIGH (ref 0.0–40.0)

## 2016-07-19 LAB — MICROALBUMIN / CREATININE URINE RATIO
CREATININE, U: 100.9 mg/dL
MICROALB UR: 33.7 mg/dL — AB (ref 0.0–1.9)
Microalb Creat Ratio: 33.4 mg/g — ABNORMAL HIGH (ref 0.0–30.0)

## 2016-07-19 LAB — LDL CHOLESTEROL, DIRECT: LDL DIRECT: 77 mg/dL

## 2016-07-19 LAB — HEMOGLOBIN A1C: Hgb A1c MFr Bld: 10.7 % — ABNORMAL HIGH (ref 4.6–6.5)

## 2016-07-19 MED ORDER — ATORVASTATIN CALCIUM 40 MG PO TABS: 40.0000 mg | ORAL_TABLET | Freq: Every day | ORAL | 1 refills | Status: DC

## 2016-07-19 MED ORDER — GLIPIZIDE ER 5 MG PO TB24: 5.0000 mg | ORAL_TABLET | Freq: Every day | ORAL | 6 refills | Status: DC

## 2016-07-19 MED ORDER — METFORMIN HCL ER 500 MG PO TB24: ORAL_TABLET | ORAL | 6 refills | Status: DC

## 2016-07-19 NOTE — Progress Notes (Signed)
OFFICE VISIT  07/19/2016   CC:  Chief Complaint  Patient presents with  . Diabetes    follow up- Discuss Glipizide   HPI:    Patient is a 46 y.o.  female who presents accompanied by her husband for f/u DM 2 and HLD and white coat HTN.  I last saw her 01/2015!  DM 2: "sugars better, but not normal".  Less than 170 usually the last 1 mo.   She found some old 5mg  glipizide tabs and she added this to her metformin and has been seeing 90-130s on this regimen. Says diet is better.  HLD: She is not taking her atorvastatin.  She ran out of this med.  Was having no trouble taking it.  Occ home bp monitoring: normal bp and HR.  She tells me she got dx'd with a benign brain tumor when she was getting a workup for unilateral hearing loss. MRI's have shown stability per pt.  She is followed by Dr. Trenton Gammon.  I have no records.   ROS: no HAs, no CP, no SOB, no myalgias/arthralgias, no fevers, no focal weakness. No dizziness.  Past Medical History:  Diagnosis Date  . Benign brain tumor ([place])    per pt report--awaiting neurosurgery records as of 06/2016  . Diabetes mellitus without complication ([place])   . Hyperlipidemia, mixed   . Sensorineural hearing loss of left ear    Sudden left hearing loss summer 2016--no improvement with steroids 01/2015 so brain MRI done by Dr. Redmond Baseman and it showed brain tumor that was determined to be benign.  Pt's hearing not bad enough for hearing aid as of 06/2016.  . White coat hypertension     Past Surgical History:  Procedure Laterality Date  . CHOLECYSTECTOMY  2000    Outpatient Medications Prior to Visit  Medication Sig Dispense Refill  . metFORMIN (GLUCOPHAGE) 1000 MG tablet Take 1 tablet (1,000 mg total) by mouth 2 (two) times daily with a meal. 60 tablet 11  . fluconazole (DIFLUCAN) 150 MG tablet Take 1 tablet (150 mg total) by mouth once. (Patient not taking: Reported on 07/19/2016) 2 tablet 0  . atorvastatin (LIPITOR) 40 MG tablet Take 1 tablet (40 mg  total) by mouth daily. (Patient not taking: Reported on 07/19/2016) 90 tablet 3   No facility-administered medications prior to visit.     Allergies  Allergen Reactions  . Gluten Meal Swelling    ROS As per HPI  PE: Blood pressure (!) 143/88, pulse (!) 102, temperature 97.9 F (36.6 C), temperature source Oral, resp. rate 16, weight 153 lb (69.4 kg), last menstrual period 07/14/2016, SpO2 97 %. Gen: Alert, well appearing.  Patient is oriented to person, place, time, and situation. AFFECT: pleasant, lucid thought and speech. CV: RRR, no m/r/g.   LUNGS: CTA bilat, nonlabored resps, good aeration in all lung fields.  LABS:  Lab Results  Component Value Date   TSH 1.61 01/18/2015   Lab Results  Component Value Date   WBC 9.6 01/18/2015   HGB 14.4 01/18/2015   HCT 43.5 01/18/2015   MCV 86.2 01/18/2015   PLT 355.0 01/18/2015   Lab Results  Component Value Date   CREATININE 0.46 01/18/2015   BUN 8 01/18/2015   NA 135 01/18/2015   K 4.2 01/18/2015   CL 100 01/18/2015   CO2 24 01/18/2015   Lab Results  Component Value Date   ALT 20 01/18/2015   AST 13 01/18/2015   ALKPHOS 85 01/18/2015   BILITOT 0.3  01/18/2015   Lab Results  Component Value Date   CHOL 241 (H) 01/18/2015   Lab Results  Component Value Date   HDL 41.70 01/18/2015   No results found for: [facility] Lab Results  Component Value Date   TRIG 220.0 (H) 01/18/2015   Lab Results  Component Value Date   CHOLHDL 6 01/18/2015   Lab Results  Component Value Date   HGBA1C 13.3 (H) 01/18/2015   IMPRESSION AND PLAN:  1) DM 2; control improved with recent addition of glipizide xl 5mg  qd. Rx'd this med for her today. Also, changed her metformin to the XR formulation, 500 mg, 2 bid. HbA1c and urine microalb/cr today. Foot exam at next f/u in 4 mo.  2) Hyperlipidemia: checking FLP today, but pt has been off her med for unknown period of time. Restart atorva 40mg  qd today. Check CMET.  3) White coat  HTN: she admits she is very nervous when she comes to the doctor's office. This perhaps partially explains why she has not followed up in about 18 mo.  4) Benign brain tumor: will get Dr. Irven [place] records for review and update of her chart.  An After Visit Summary was printed and given to the patient.  FOLLOW UP: Return in about 4 months (around 11/19/2016) for routine chronic illness f/u.  Signed:  Crissie Sickles, MD           07/19/2016

## 2016-11-22 ENCOUNTER — Ambulatory Visit: Payer: BLUE CROSS/BLUE SHIELD | Admitting: Family Medicine

## 2017-02-13 ENCOUNTER — Other Ambulatory Visit: Payer: Self-pay

## 2017-02-13 ENCOUNTER — Encounter: Payer: Self-pay | Admitting: Family Medicine

## 2017-02-13 ENCOUNTER — Ambulatory Visit (INDEPENDENT_AMBULATORY_CARE_PROVIDER_SITE_OTHER): Payer: Self-pay | Admitting: Family Medicine

## 2017-02-13 ENCOUNTER — Other Ambulatory Visit: Payer: Self-pay | Admitting: Family Medicine

## 2017-02-13 VITALS — BP 162/83 | HR 100 | Temp 97.9°F | Resp 16 | Ht 63.0 in | Wt 155.0 lb

## 2017-02-13 DIAGNOSIS — E7800 Pure hypercholesterolemia, unspecified: Secondary | ICD-10-CM

## 2017-02-13 DIAGNOSIS — IMO0002 Reserved for concepts with insufficient information to code with codable children: Secondary | ICD-10-CM

## 2017-02-13 DIAGNOSIS — E1165 Type 2 diabetes mellitus with hyperglycemia: Secondary | ICD-10-CM

## 2017-02-13 DIAGNOSIS — E118 Type 2 diabetes mellitus with unspecified complications: Secondary | ICD-10-CM

## 2017-02-13 DIAGNOSIS — N946 Dysmenorrhea, unspecified: Secondary | ICD-10-CM

## 2017-02-13 LAB — POCT GLYCOSYLATED HEMOGLOBIN (HGB A1C): Hemoglobin A1C: 10.4

## 2017-02-13 MED ORDER — PIOGLITAZONE HCL 15 MG PO TABS: ORAL_TABLET | ORAL | 2 refills | Status: DC

## 2017-02-13 MED ORDER — HYDROCODONE-ACETAMINOPHEN 5-325 MG PO TABS: 1.0000 | ORAL_TABLET | Freq: Four times a day (QID) | ORAL | 0 refills | Status: DC | PRN

## 2017-02-13 MED ORDER — GLIPIZIDE ER 10 MG PO TB24: 10.0000 mg | ORAL_TABLET | Freq: Every day | ORAL | 2 refills | Status: DC

## 2017-02-13 MED ORDER — ATORVASTATIN CALCIUM 40 MG PO TABS: 40.0000 mg | ORAL_TABLET | Freq: Every day | ORAL | 1 refills | Status: DC

## 2017-02-13 NOTE — Progress Notes (Signed)
OFFICE VISIT  02/13/2017   CC:  Chief Complaint  Patient presents with  . Follow-up    RCI, pt is not fasting.    HPI:    Patient is a 46 y.o. Caucasian female who presents for 7 mo f/u DM 2 with complication, HLD, hx of white coat HTN. Last visit she had been off her DM and cholesterol med regimen, had just recently added glipizide and glucoses were improving. We restarted her on atorva 40mg  qd last visit.  However, patient states today that she never restarted this med. Pt reports complications with pharmacy.    DM:  120s -180s fasting, irregular monitor at home, no checks later in the day.  She has no insurance so finances are impacting care some. Member at the [facility]: nautilus and walking 2 d/week, goal is 5d/week.  Animal nutritionist at [employer]. Diet; currently doing very low carb at this time.  Reading a book about good diet for DM. No pain, tingling, or numbness in feet.  Last 1 yr or so, has very severe pelvic cramps the first 2 days of her menses, 1st day heavy bleeding but otherwise the bleeding is what she considers normal.  Periods occur very regularly.  Taking max dose ibuprofen. Remote hx of dx of small fibroid.  Had w/u for possible endometriosis and this dx was not found.   Past Medical History:  Diagnosis Date  . Benign brain tumor ([place])    hamartoma or low grade glioma per Dr. Trenton Gammon: stable MRI 02/2016.  Dr. Trenton Gammon with plan for surveillance MRI brain 02/2017.  Marland Kitchen Hyperlipidemia, mixed   . Nonproliferative diabetic retinopathy of both eyes without macular edema ([facility])    Moderate as of 02/2016 (Dr. Nicki Reaper)  . Sensorineural hearing loss of left ear    Sudden left hearing loss summer 2016--no improvement with steroids 01/2015 so brain MRI done by Dr. Redmond Baseman and it showed brain tumor that was determined to be benign.  Pt's hearing not bad enough for hearing aid as of 06/2016.  . Type 2 diabetes with complication ([place])    diab retpthy  . White coat hypertension     Past Surgical  History:  Procedure Laterality Date  . CHOLECYSTECTOMY  2000    Outpatient Medications Prior to Visit  Medication Sig Dispense Refill  . metFORMIN (GLUCOPHAGE-XR) 500 MG 24 hr tablet 2 tabs po bid 120 tablet 6  . glipiZIDE (GLUCOTROL XL) 5 MG 24 hr tablet Take 1 tablet (5 mg total) by mouth daily with breakfast. 30 tablet 6  . atorvastatin (LIPITOR) 40 MG tablet Take 1 tablet (40 mg total) by mouth daily. (Patient not taking: Reported on 02/13/2017) 90 tablet 1  . fluconazole (DIFLUCAN) 150 MG tablet Take 1 tablet (150 mg total) by mouth once. (Patient not taking: Reported on 07/19/2016) 2 tablet 0   No facility-administered medications prior to visit.     Allergies  Allergen Reactions  . Gluten Meal Swelling    ROS As per HPI  PE: Blood pressure (!) 162/83, pulse 100, temperature 97.9 F (36.6 C), temperature source Oral, resp. rate 16, height 5\' 3"  (1.6 m), weight 155 lb (70.3 kg), last menstrual period 02/09/2017, SpO2 99 %. Gen: Alert, well appearing.  Patient is oriented to person, place, time, and situation. AFFECT: pleasant, lucid thought and speech. Foot exam - both   normal; no swelling, tenderness or skin or vascular lesions. Color and temperature is normal. Sensation is intact. Peripheral pulses are palpable. Toenails are normal.  LABS:  Lab Results  Component Value Date   TSH 1.61 01/18/2015   Lab Results  Component Value Date   WBC 9.6 01/18/2015   HGB 14.4 01/18/2015   HCT 43.5 01/18/2015   MCV 86.2 01/18/2015   PLT 355.0 01/18/2015   Lab Results  Component Value Date   CREATININE 0.47 07/19/2016   BUN 15 07/19/2016   NA 139 07/19/2016   K 4.4 07/19/2016   CL 106 07/19/2016   CO2 22 07/19/2016   Lab Results  Component Value Date   ALT 55 (H) 07/19/2016   AST 35 07/19/2016   ALKPHOS 68 07/19/2016   BILITOT 0.3 07/19/2016   Lab Results  Component Value Date   CHOL 151 07/19/2016   Lab Results  Component Value Date   HDL 34.50 (L)  07/19/2016   No results found for: [facility] Lab Results  Component Value Date   TRIG 276.0 (H) 07/19/2016   Lab Results  Component Value Date   CHOLHDL 4 07/19/2016   Lab Results  Component Value Date   HGBA1C 10.4 02/13/2017   POC HbA1c today= 10.4%  IMPRESSION AND PLAN:  1) DM 2, poor control.  A1c 10.4 % today--not significantly changed compared to 7 mo ago. Med compliance, finances, and to some extent patient motivation/ability to diet/exercise affecting control. Will increase glipizide xl to 10mg  qd, continue full dose metformin, and add pioglitazone. Start pioglitazone 15mg  tab once daily for 2 weeks, then increase to 2 tabs once daily. She'll continue to work on diet and exercise. Feet exam normal today.  2) Hypercholesterolemia: noncompliant with statin. Will try again to restart this. Pt not fasting today.  3) Dysmenorrhea: with regular occurring menses and normal menstrual bleeding. She'll continue 600 mg ibuprofen tid and I rx'd vicodin 5/325 to take 1-2 q6h prn during the 2 days of menses that have severe pelvic cramping. #40 tabs.  An After Visit Summary was printed and given to the patient.  FOLLOW UP: Return in about 3 months (around 05/14/2017) for routine chronic illness f/u.  Signed:  Crissie Sickles, MD           02/13/2017

## 2017-02-13 NOTE — Patient Instructions (Signed)
Start pioglitazone 15mg  tab once daily for 2 weeks, then increase to 2 tabs once daily.

## 2017-03-15 ENCOUNTER — Ambulatory Visit (INDEPENDENT_AMBULATORY_CARE_PROVIDER_SITE_OTHER): Payer: Self-pay | Admitting: Family Medicine

## 2017-03-15 ENCOUNTER — Encounter: Payer: Self-pay | Admitting: Family Medicine

## 2017-03-15 VITALS — BP 131/84 | HR 104 | Temp 97.5°F | Wt 160.8 lb

## 2017-03-15 DIAGNOSIS — R112 Nausea with vomiting, unspecified: Secondary | ICD-10-CM

## 2017-03-15 DIAGNOSIS — T50905A Adverse effect of unspecified drugs, medicaments and biological substances, initial encounter: Secondary | ICD-10-CM

## 2017-03-15 DIAGNOSIS — E1165 Type 2 diabetes mellitus with hyperglycemia: Secondary | ICD-10-CM

## 2017-03-15 DIAGNOSIS — R110 Nausea: Secondary | ICD-10-CM

## 2017-03-15 MED ORDER — PROMETHAZINE HCL 12.5 MG PO TABS: ORAL_TABLET | ORAL | 1 refills | Status: DC

## 2017-03-15 MED ORDER — CANAGLIFLOZIN 100 MG PO TABS: 100.0000 mg | ORAL_TABLET | Freq: Every day | ORAL | 1 refills | Status: DC

## 2017-03-15 MED ORDER — PANTOPRAZOLE SODIUM 40 MG PO TBEC: 40.0000 mg | DELAYED_RELEASE_TABLET | Freq: Every day | ORAL | 3 refills | Status: DC

## 2017-03-15 NOTE — Progress Notes (Signed)
OFFICE VISIT  03/15/2017   CC:  Chief Complaint  Patient presents with  . Follow-up    pts is here for DM f/u plus medication change   HPI:    Patient is a 47 y.o. Caucasian female who presents for medication and diabetes concerns. At last diabetic f/u 1 mo ago her control was not good so we increased her glipizide xl to 10mg  qd and added pioglitazone 15mg  qd. Also, we started atorvastatin 40mg  qd for her hyperlipidemia.  Diet changes/exercise changes for the better since I saw her. Glucoses went up the day after taking first dose of actos: says gluc was 323.  Stayed in 300s for the next 1 week, then has come down into 220s at the best.  Has had some upper abd bloating, a little wt gain, has signif nausea but no vomiting lately.  Has been dealing with frequent nausea for about 8 mo, at least a few days a week, mornings mostly and dissipates by evening.  Occ nonbilious vomiting.  Eating does not really make it come on any worse but sometimes she does feel like the food "just sits there" and she feels full early.  Denies feeling classic GERD or LPR. No abd pain with this.  Has intermittent upper abd distention when her nausea is the worst..  She has gluten intolerance and doesn't follow fully gluten free diet but never has.  She had upper endoscopy to r/o celiac dz. She drinks no alcohol.  She has not identified anything that makes it better except when she vomits it alleviates the nausea for the most part.  She can't identify anything that makes her nausea worse.  No persistent diarrhea, no signif persistent constipation. Has not seen any medical provider for this problem, no meds rx'd.  No fevers, no CP, no palpitations, no SOB, no HAs. No dysphagia or odynophagia.  Past Medical History:  Diagnosis Date  . Benign brain tumor ([place])    hamartoma or low grade glioma per Dr. Trenton Gammon: stable MRI 02/2016.  Dr. Trenton Gammon with plan for surveillance MRI brain 02/2017.  Marland Kitchen Gluten intolerance    pt reports  she underwent full GI w/u to r/o celiac dz  . Hyperlipidemia, mixed   . Nonproliferative diabetic retinopathy of both eyes without macular edema ([facility])    Moderate as of 02/2016 (Dr. Nicki Reaper)  . Sensorineural hearing loss of left ear    Sudden left hearing loss summer 2016--no improvement with steroids 01/2015 so brain MRI done by Dr. Redmond Baseman and it showed brain tumor that was determined to be benign.  Pt's hearing not bad enough for hearing aid as of 06/2016.  . Type 2 diabetes with complication ([place])    diab retpthy  . White coat hypertension     Past Surgical History:  Procedure Laterality Date  . CHOLECYSTECTOMY  2000    Outpatient Medications Prior to Visit  Medication Sig Dispense Refill  . atorvastatin (LIPITOR) 40 MG tablet Take 1 tablet (40 mg total) by mouth daily. 90 tablet 1  . glipiZIDE (GLUCOTROL XL) 10 MG 24 hr tablet Take 1 tablet (10 mg total) by mouth daily with breakfast. 30 tablet 2  . HYDROcodone-acetaminophen (NORCO/VICODIN) 5-325 MG tablet Take 1-2 tablets by mouth every 6 (six) hours as needed for moderate pain. 40 tablet 0  . metFORMIN (GLUCOPHAGE-XR) 500 MG 24 hr tablet TAKE 2 TABLETS BY MOUTH TWICE DAILY 120 tablet 5  . pioglitazone (ACTOS) 15 MG tablet 1-2 tabs po qd 60 tablet 2  No facility-administered medications prior to visit.     Allergies  Allergen Reactions  . Gluten Meal Swelling  . Pioglitazone Other (See Comments)    ELEVATED glucoses + worse chronic nausea    ROS As per HPI  PE: Blood pressure 131/84, pulse (!) 104, temperature (!) 97.5 F (36.4 C), temperature source Oral, weight 160 lb 12.8 oz (72.9 kg), last menstrual period 03/06/2017, SpO2 98 %. Gen: Alert, well appearing.  Patient is oriented to person, place, time, and situation. AFFECT: pleasant, lucid thought and speech. ENT: no icterus ABD: soft, NT, ND, BS normal.  No hepatospenomegaly or mass.  No bruits. EXT: no clubbing, cyanosis, or edema.  Skin: no pallor or  jaundice  LABS:  Lab Results  Component Value Date   TSH 1.61 01/18/2015   Lab Results  Component Value Date   WBC 9.6 01/18/2015   HGB 14.4 01/18/2015   HCT 43.5 01/18/2015   MCV 86.2 01/18/2015   PLT 355.0 01/18/2015   Lab Results  Component Value Date   CREATININE 0.47 07/19/2016   BUN 15 07/19/2016   NA 139 07/19/2016   K 4.4 07/19/2016   CL 106 07/19/2016   CO2 22 07/19/2016   Lab Results  Component Value Date   ALT 55 (H) 07/19/2016   AST 35 07/19/2016   ALKPHOS 68 07/19/2016   BILITOT 0.3 07/19/2016   Lab Results  Component Value Date   CHOL 151 07/19/2016   Lab Results  Component Value Date   HDL 34.50 (L) 07/19/2016   No results found for: [facility] Lab Results  Component Value Date   TRIG 276.0 (H) 07/19/2016   Lab Results  Component Value Date   CHOLHDL 4 07/19/2016   Lab Results  Component Value Date   HGBA1C 10.4 02/13/2017    IMPRESSION AND PLAN:  1) DM 2 poor control: question of WORSENING glucoses due to recent addition of actos 15mg  to her regimen. I also increased her glipizide xl to 10mg  but don't think this would be the culprit.  She has been on metformin long term and has not had problems like this (but question recent diff manufacturer with newest rx---pt and husband don't know --this is just a guess of theirs).   Plan: stop actos.  Wait 3d, then start invokana 100mg  qd, continue current metformin and glipizide dosing.    2) Chronic nausea, some early satiety. Of note, she has no gallbladder. Her sx's sound most like diabetic gastroparesis. Will check CBC, CMET, lipase. Will order NM gastric emptying scan. Started phenergan 12.5, 1-2 q6h prn.  Therapeutic expectations and side effect profile of medication discussed today.  Patient's questions answered. Also, since this may be an atypical manifestation of GERD, will start daily AM dose of pantoprazole 40mg .  An After Visit Summary was printed and given to the patient.  FOLLOW UP:  Return in about 2 weeks (around 03/29/2017) for f/u DM and nausea (30 min).  Signed:  Crissie Sickles, MD           03/15/2017

## 2017-03-16 LAB — CBC WITH DIFFERENTIAL/PLATELET
BASOS ABS: 0.1 10*3/uL (ref 0.0–0.1)
Basophils Relative: 0.9 % (ref 0.0–3.0)
EOS PCT: 6.4 % — AB (ref 0.0–5.0)
Eosinophils Absolute: 0.5 10*3/uL (ref 0.0–0.7)
HEMATOCRIT: 39.1 % (ref 36.0–46.0)
HEMOGLOBIN: 12.7 g/dL (ref 12.0–15.0)
LYMPHS PCT: 21.1 % (ref 12.0–46.0)
Lymphs Abs: 1.7 10*3/uL (ref 0.7–4.0)
MCHC: 32.4 g/dL (ref 30.0–36.0)
MCV: 85.3 fl (ref 78.0–100.0)
MONOS PCT: 5.1 % (ref 3.0–12.0)
Monocytes Absolute: 0.4 10*3/uL (ref 0.1–1.0)
Neutro Abs: 5.5 10*3/uL (ref 1.4–7.7)
Neutrophils Relative %: 66.5 % (ref 43.0–77.0)
Platelets: 428 10*3/uL — ABNORMAL HIGH (ref 150.0–400.0)
RBC: 4.58 Mil/uL (ref 3.87–5.11)
RDW: 15.1 % (ref 11.5–15.5)
WBC: 8.2 10*3/uL (ref 4.0–10.5)

## 2017-03-16 LAB — COMPREHENSIVE METABOLIC PANEL
ALBUMIN: 4.1 g/dL (ref 3.5–5.2)
ALT: 33 U/L (ref 0–35)
AST: 21 U/L (ref 0–37)
Alkaline Phosphatase: 70 U/L (ref 39–117)
BILIRUBIN TOTAL: 0.2 mg/dL (ref 0.2–1.2)
BUN: 11 mg/dL (ref 6–23)
CO2: 30 mEq/L (ref 19–32)
Calcium: 9.6 mg/dL (ref 8.4–10.5)
Chloride: 100 mEq/L (ref 96–112)
Creatinine, Ser: 0.51 mg/dL (ref 0.40–1.20)
GFR: 136.62 mL/min (ref >60.00)
Glucose, Bld: 285 mg/dL — ABNORMAL HIGH (ref 70–99)
POTASSIUM: 4.5 meq/L (ref 3.5–5.1)
Sodium: 138 mEq/L (ref 135–145)
Total Protein: 6.6 g/dL (ref 6.0–8.3)

## 2017-03-16 LAB — SEDIMENTATION RATE: Sed Rate: 24 mm/hr — ABNORMAL HIGH (ref 0–20)

## 2017-03-16 LAB — LIPASE: LIPASE: 54 U/L (ref 11.0–59.0)

## 2017-03-27 ENCOUNTER — Encounter: Payer: Self-pay | Admitting: *Deleted

## 2017-04-04 ENCOUNTER — Encounter: Payer: Self-pay | Admitting: Family Medicine

## 2017-04-04 ENCOUNTER — Ambulatory Visit: Payer: BLUE CROSS/BLUE SHIELD | Admitting: Family Medicine

## 2017-04-04 VITALS — BP 143/88 | HR 108 | Temp 97.6°F | Ht 63.0 in | Wt 159.1 lb

## 2017-04-04 DIAGNOSIS — K219 Gastro-esophageal reflux disease without esophagitis: Secondary | ICD-10-CM

## 2017-04-04 DIAGNOSIS — E1143 Type 2 diabetes mellitus with diabetic autonomic (poly)neuropathy: Secondary | ICD-10-CM | POA: Diagnosis not present

## 2017-04-04 DIAGNOSIS — K3184 Gastroparesis: Secondary | ICD-10-CM

## 2017-04-04 DIAGNOSIS — E118 Type 2 diabetes mellitus with unspecified complications: Secondary | ICD-10-CM | POA: Diagnosis not present

## 2017-04-04 NOTE — Progress Notes (Signed)
OFFICE VISIT  04/08/2017   CC:  Chief Complaint  Patient presents with  . Follow-up     2 weeks f/u DM and Nausea   HPI:    Patient is a 47 y.o. Caucasian female who presents accompanied by her husband for 3 week f/u uncontrolled DM and GI sx's which I felt may be secondary to diabetic gastroparesis. I ordered a gastric emptying scan but it appears she never got this done.  I started her on pantoprazole 40mg  qAM and rx'd phenergan for prn use. Lab eval normal.  Last visit I kept her on metformin and glipizide, d/c'd pioglitazone (she felt like her glucoses were going UP on this med ??).  I started invokana 100 mg qd at that time.  She started this med and denies any side effects from invokana.  Glucoses much improved: last 10d avg fasting 135.  Cutting out carbs.  Started pantoprazole 40mg  qAM and has not had any further nausea except one episode and she took phenergan x 1.    The bloating feeling of abdomen has not changed. Has some constipation on/off, subsides w/out otc laxative type meds. She asks about potentially taking part in a diabetic gastroparesis study that she saw advertised.   Past Medical History:  Diagnosis Date  . Benign brain tumor ([place])    hamartoma or low grade glioma per Dr. Trenton Gammon: stable MRI 02/2016.  Dr. Trenton Gammon with plan for surveillance MRI brain 02/2017.  Marland Kitchen Gluten intolerance    pt reports she underwent full GI w/u to r/o celiac dz  . Hyperlipidemia, mixed   . Nonproliferative diabetic retinopathy of both eyes without macular edema ([facility])    Moderate as of 02/2016 (Dr. Nicki Reaper)  . Sensorineural hearing loss of left ear    Sudden left hearing loss summer 2016--no improvement with steroids 01/2015 so brain MRI done by Dr. Redmond Baseman and it showed brain tumor that was determined to be benign.  Pt's hearing not bad enough for hearing aid as of 06/2016.  . Type 2 diabetes with complication ([place])    diab retpthy  . White coat hypertension     Past Surgical History:   Procedure Laterality Date  . CHOLECYSTECTOMY  2000    Outpatient Medications Prior to Visit  Medication Sig Dispense Refill  . atorvastatin (LIPITOR) 40 MG tablet Take 1 tablet (40 mg total) by mouth daily. 90 tablet 1  . canagliflozin (INVOKANA) 100 MG TABS tablet Take 1 tablet (100 mg total) by mouth daily before breakfast. 30 tablet 1  . glipiZIDE (GLUCOTROL XL) 10 MG 24 hr tablet Take 1 tablet (10 mg total) by mouth daily with breakfast. 30 tablet 2  . HYDROcodone-acetaminophen (NORCO/VICODIN) 5-325 MG tablet Take 1-2 tablets by mouth every 6 (six) hours as needed for moderate pain. 40 tablet 0  . metFORMIN (GLUCOPHAGE-XR) 500 MG 24 hr tablet TAKE 2 TABLETS BY MOUTH TWICE DAILY 120 tablet 5  . pantoprazole (PROTONIX) 40 MG tablet Take 1 tablet (40 mg total) by mouth daily. 30 tablet 3  . promethazine (PHENERGAN) 12.5 MG tablet 1-2 tabs po q6h prn nausea 30 tablet 1   No facility-administered medications prior to visit.     Allergies  Allergen Reactions  . Gluten Meal Swelling  . Pioglitazone Other (See Comments)    ELEVATED glucoses + worse chronic nausea    ROS As per HPI  PE: Blood pressure (!) 143/88, pulse (!) 108, temperature 97.6 F (36.4 C), temperature source Oral, height 5\' 3"  (1.6 m),  weight 159 lb 1.9 oz (72.2 kg), last menstrual period 03/06/2017, SpO2 97 %. Gen: Alert, well appearing.  Patient is oriented to person, place, time, and situation. AFFECT: pleasant, lucid thought and speech. No further exam today.  LABS:    Chemistry      Component Value Date/Time   NA 138 03/15/2017 1337   K 4.5 03/15/2017 1337   CL 100 03/15/2017 1337   CO2 30 03/15/2017 1337   BUN 11 03/15/2017 1337   CREATININE 0.51 03/15/2017 1337      Component Value Date/Time   CALCIUM 9.6 03/15/2017 1337   ALKPHOS 70 03/15/2017 1337   AST 21 03/15/2017 1337   ALT 33 03/15/2017 1337   BILITOT 0.2 03/15/2017 1337     Lab Results  Component Value Date   CHOL 151 07/19/2016    HDL 34.50 (L) 07/19/2016   LDLDIRECT 77.0 07/19/2016   TRIG 276.0 (H) 07/19/2016   CHOLHDL 4 07/19/2016   Lab Results  Component Value Date   HGBA1C 10.4 02/13/2017   Lab Results  Component Value Date   LIPASE 54.0 03/15/2017   Lab Results  Component Value Date   ESRSEDRATE 24 (H) 03/15/2017   Lab Results  Component Value Date   WBC 8.2 03/15/2017   HGB 12.7 03/15/2017   HCT 39.1 03/15/2017   MCV 85.3 03/15/2017   PLT 428.0 (H) 03/15/2017    IMPRESSION AND PLAN:  1) DM2, poor control but improving last couple weeks on invokana. Continue glipizide xl, metformin, and invokana at current doses.  2) Nausea: suspect this was mostly GERD-related, but she sounds like she still may have some functional GI sx's +/- diab gastroparesis.  Continue PPI qd and I encouraged her to sign of for the diabetic gastroparesis research study she showed me today---at least see if she qualifies.  For now, well put off the gastric emptying scan I ordered.  An After Visit Summary was printed and given to the patient.  FOLLOW UP: Return for Make sure pt has appt in late march this year for routine chronic illness f/u-thx.  Signed:  Crissie Sickles, MD           04/08/2017

## 2017-04-05 ENCOUNTER — Encounter: Payer: Self-pay | Admitting: Family Medicine

## 2017-04-13 ENCOUNTER — Encounter (HOSPITAL_COMMUNITY): Payer: BLUE CROSS/BLUE SHIELD

## 2017-04-19 ENCOUNTER — Ambulatory Visit (HOSPITAL_COMMUNITY)
Admission: RE | Admit: 2017-04-19 | Discharge: 2017-04-19 | Disposition: A | Discharge status: Home / Self Care | Payer: BLUE CROSS/BLUE SHIELD | Source: Ambulatory Visit | Attending: Family Medicine | Admitting: Family Medicine

## 2017-04-19 DIAGNOSIS — E1165 Type 2 diabetes mellitus with hyperglycemia: Secondary | ICD-10-CM | POA: Insufficient documentation

## 2017-04-19 DIAGNOSIS — R110 Nausea: Secondary | ICD-10-CM

## 2017-04-19 DIAGNOSIS — R112 Nausea with vomiting, unspecified: Secondary | ICD-10-CM | POA: Insufficient documentation

## 2017-04-19 DIAGNOSIS — R933 Abnormal findings on diagnostic imaging of other parts of digestive tract: Secondary | ICD-10-CM | POA: Diagnosis not present

## 2017-04-19 MED ORDER — TECHNETIUM TC 99M SULFUR COLLOID
2.0000 | Freq: Once | INTRAVENOUS | Status: AC | PRN
  Administered 2017-04-19: 2 via INTRAVENOUS

## 2017-04-20 ENCOUNTER — Encounter: Payer: Self-pay | Admitting: Family Medicine

## 2017-04-20 HISTORY — PX: OTHER SURGICAL HISTORY: SHX169

## 2017-05-07 ENCOUNTER — Other Ambulatory Visit: Payer: Self-pay | Admitting: Neurosurgery

## 2017-05-07 ENCOUNTER — Telehealth: Payer: Self-pay | Admitting: Family Medicine

## 2017-05-07 DIAGNOSIS — D330 Benign neoplasm of brain, supratentorial: Secondary | ICD-10-CM

## 2017-05-07 MED ORDER — CANAGLIFLOZIN 100 MG PO TABS: 100.0000 mg | ORAL_TABLET | Freq: Every day | ORAL | 1 refills | Status: DC

## 2017-05-07 MED ORDER — GLIPIZIDE ER 10 MG PO TB24: 10.0000 mg | ORAL_TABLET | Freq: Every day | ORAL | 2 refills | Status: DC

## 2017-05-07 NOTE — Telephone Encounter (Signed)
Copied from [place] [id number]. Topic: Quick Communication - Rx Refill/Question >> May 07, 2017  1:21 PM [place], [place] C wrote: Medication: glipiZIDE (GLUCOTROL XL) 10 MG 24 hr tablet  and also canagliflozin (INVOKANA) 100 MG TABS tablet    Has the patient contacted their pharmacy? no   (Agent: If no, request that the patient contact the pharmacy for the refill.)   Preferred Pharmacy (with phone number or street name): [place], [place] - [address]   Agent: Please be advised that RX refills may take up to 3 business days. We ask that you follow-up with your pharmacy.

## 2017-05-16 ENCOUNTER — Ambulatory Visit: Payer: BLUE CROSS/BLUE SHIELD | Admitting: Family Medicine

## 2017-05-16 ENCOUNTER — Encounter: Payer: Self-pay | Admitting: Family Medicine

## 2017-05-16 VITALS — BP 122/82 | HR 93 | Resp 16 | Ht 63.0 in | Wt 156.0 lb

## 2017-05-16 DIAGNOSIS — M791 Myalgia, unspecified site: Secondary | ICD-10-CM | POA: Diagnosis not present

## 2017-05-16 DIAGNOSIS — K3184 Gastroparesis: Secondary | ICD-10-CM | POA: Diagnosis not present

## 2017-05-16 DIAGNOSIS — E118 Type 2 diabetes mellitus with unspecified complications: Secondary | ICD-10-CM

## 2017-05-16 DIAGNOSIS — K929 Disease of digestive system, unspecified: Secondary | ICD-10-CM

## 2017-05-16 DIAGNOSIS — E1143 Type 2 diabetes mellitus with diabetic autonomic (poly)neuropathy: Secondary | ICD-10-CM | POA: Diagnosis not present

## 2017-05-16 DIAGNOSIS — M25511 Pain in right shoulder: Secondary | ICD-10-CM

## 2017-05-16 DIAGNOSIS — E1165 Type 2 diabetes mellitus with hyperglycemia: Secondary | ICD-10-CM | POA: Diagnosis not present

## 2017-05-16 DIAGNOSIS — IMO0001 Reserved for inherently not codable concepts without codable children: Secondary | ICD-10-CM

## 2017-05-16 LAB — POCT GLYCOSYLATED HEMOGLOBIN (HGB A1C): Hemoglobin A1C: 8.6

## 2017-05-16 NOTE — Progress Notes (Signed)
OFFICE VISIT  05/16/2017   CC:  Chief Complaint  Patient presents with  . Follow-up    RCI    HPI:    Patient is a 47 y.o. Caucasian female who presents for 6 week f/u DM 2. Last f/u visit she was improving since adding Invokana.   Says she is struggling with diet, particularly with her hx of function GI sx's.  Glucoses: avg fasting 130s-160s range.  No checks later in the day. Still walking 1 hour/day 4-5 times per week.  GI: has had some return of nausea last 3 wks or so, phenergan prn helpful--makes her a little sleepy.  Wakes her up in early morning, lasts through her morning and then resolves.  Occ has a dry heave but no vomiting. Taking pantoprazole daily.   She is on waiting list for research study for diabetic gastroparesis.  New c/o: achy joints and stiff muscles, going on x 6 mo or so.  Diffuse, symmetric, achiness in muscles/joints from neck down into legs.  Seems to wax and wane, worse in evenings.   No swelling of joint.  No fevers or rashes. Question of onset of sx's near the time of getting a 10d ILI.    Past Medical History:  Diagnosis Date  . Benign brain tumor ([place])    hamartoma or low grade glioma per Dr. Trenton Gammon: stable MRI 02/2016.  Dr. Trenton Gammon with plan for surveillance MRI brain 02/2017.  Marland Kitchen Gluten intolerance    pt reports she underwent full GI w/u to r/o celiac dz  . Hyperlipidemia, mixed   . Nonproliferative diabetic retinopathy of both eyes without macular edema (HCC)    Moderate as of 02/2016 (Dr. Nicki Reaper)  . Sensorineural hearing loss of left ear    Sudden left hearing loss summer 2016--no improvement with steroids 01/2015 so brain MRI done by Dr. Redmond Baseman and it showed brain tumor that was determined to be benign.  Pt's hearing not bad enough for hearing aid as of 06/2016.  . Type 2 diabetes with complication ([place])    diab retpthy, diabetic gastroparesis (gastric emptying study mildly abnl 03/2017)  . White coat hypertension     Past Surgical History:   Procedure Laterality Date  . CHOLECYSTECTOMY  2000  . GASTRIC EMPTYING SCAN  04/20/2017   Mildly abnormal, particularly the 1st hour of emptying.    Outpatient Medications Prior to Visit  Medication Sig Dispense Refill  . atorvastatin (LIPITOR) 40 MG tablet Take 1 tablet (40 mg total) by mouth daily. 90 tablet 1  . canagliflozin (INVOKANA) 100 MG TABS tablet Take 1 tablet (100 mg total) by mouth daily before breakfast. 30 tablet 1  . glipiZIDE (GLUCOTROL XL) 10 MG 24 hr tablet Take 1 tablet (10 mg total) by mouth daily with breakfast. 30 tablet 2  . HYDROcodone-acetaminophen (NORCO/VICODIN) 5-325 MG tablet Take 1-2 tablets by mouth every 6 (six) hours as needed for moderate pain. 40 tablet 0  . metFORMIN (GLUCOPHAGE-XR) 500 MG 24 hr tablet TAKE 2 TABLETS BY MOUTH TWICE DAILY 120 tablet 5  . pantoprazole (PROTONIX) 40 MG tablet Take 1 tablet (40 mg total) by mouth daily. 30 tablet 3  . promethazine (PHENERGAN) 12.5 MG tablet 1-2 tabs po q6h prn nausea 30 tablet 1   No facility-administered medications prior to visit.     Allergies  Allergen Reactions  . Gluten Meal Swelling  . Pioglitazone Other (See Comments)    ELEVATED glucoses + worse chronic nausea    ROS As per HPI  PE: Blood pressure 122/82, pulse 93, resp. rate 16, height 5\' 3"  (1.6 m), weight 156 lb (70.8 kg), last menstrual period 05/01/2017, SpO2 99 %. Gen: Alert, well appearing.  Patient is oriented to person, place, time, and situation. AFFECT: pleasant, lucid thought and speech. Musculoskeletal: no joint or soft tissue swelling, erythema, warmth, or tenderness.  ROM of all joints intact.   LABS:    Chemistry      Component Value Date/Time   NA 138 03/15/2017 1337   K 4.5 03/15/2017 1337   CL 100 03/15/2017 1337   CO2 30 03/15/2017 1337   BUN 11 03/15/2017 1337   CREATININE 0.51 03/15/2017 1337      Component Value Date/Time   CALCIUM 9.6 03/15/2017 1337   ALKPHOS 70 03/15/2017 1337   AST 21 03/15/2017  1337   ALT 33 03/15/2017 1337   BILITOT 0.2 03/15/2017 1337     Lab Results  Component Value Date   HGBA1C 8.6 05/16/2017   Lab Results  Component Value Date   CHOL 151 07/19/2016   HDL 34.50 (L) 07/19/2016   LDLDIRECT 77.0 07/19/2016   TRIG 276.0 (H) 07/19/2016   CHOLHDL 4 07/19/2016   POC HbA1c today= 8.6%  IMPRESSION AND PLAN:  1) DM 2, control is improving: A1c down from 10.4 to 8.6%. She is going to set up eye exam soon. Otherwise all routine diabetic monitoring is UTD.  2) Functional GI disorder/motility d/o: + diab gastroparesis. Referral to nutritionist ordered.  2) Myalgias/arthralgias: w/out sign of inflammation. Exam normal today. Possibly post-viral syndrome. Watchful waiting approach.  An After Visit Summary was printed and given to the patient.  FOLLOW UP: Return in about 3 months (around 08/16/2017) for routine chronic illness f/u.  Signed:  Crissie Sickles, MD           05/16/2017

## 2017-05-22 ENCOUNTER — Telehealth: Payer: Self-pay | Admitting: *Deleted

## 2017-05-22 NOTE — Telephone Encounter (Signed)
Copied from [place] [id number]. Topic: Appointment Scheduling - Scheduling Inquiry for Clinic >> May 21, 2017  4:03 PM Bea Graff, NT wrote: Reason for CRM: Dr. Jenne Campus at [facility] calling to let Dr. Anitra Lauth know this pt has been scheduled for her nutrition appointment on 06/14/17 @ 1:30pm. CB#: [phone]

## 2017-05-22 NOTE — Telephone Encounter (Signed)
FYI

## 2017-05-31 ENCOUNTER — Other Ambulatory Visit: Payer: BLUE CROSS/BLUE SHIELD

## 2017-06-12 ENCOUNTER — Ambulatory Visit
Admission: RE | Admit: 2017-06-12 | Discharge: 2017-06-12 | Disposition: A | Discharge status: Home / Self Care | Payer: BLUE CROSS/BLUE SHIELD | Source: Ambulatory Visit | Attending: Neurosurgery | Admitting: Neurosurgery

## 2017-06-12 DIAGNOSIS — D330 Benign neoplasm of brain, supratentorial: Secondary | ICD-10-CM | POA: Diagnosis not present

## 2017-06-12 MED ORDER — GADOBENATE DIMEGLUMINE 529 MG/ML IV SOLN
14.0000 mL | Freq: Once | INTRAVENOUS | Status: AC | PRN
  Administered 2017-06-12: 14 mL via INTRAVENOUS

## 2017-06-14 ENCOUNTER — Ambulatory Visit: Payer: BLUE CROSS/BLUE SHIELD | Admitting: Family Medicine

## 2017-07-04 ENCOUNTER — Other Ambulatory Visit: Payer: Self-pay | Admitting: Family Medicine

## 2017-07-16 ENCOUNTER — Other Ambulatory Visit: Payer: Self-pay | Admitting: Family Medicine

## 2017-08-03 ENCOUNTER — Other Ambulatory Visit: Payer: Self-pay | Admitting: Family Medicine

## 2017-08-22 ENCOUNTER — Encounter: Payer: Self-pay | Admitting: Family Medicine

## 2017-08-22 ENCOUNTER — Ambulatory Visit: Payer: BLUE CROSS/BLUE SHIELD | Admitting: Family Medicine

## 2017-08-22 VITALS — BP 137/86 | HR 105 | Temp 98.3°F | Resp 16 | Ht 63.0 in | Wt 159.4 lb

## 2017-08-22 DIAGNOSIS — R140 Abdominal distension (gaseous): Secondary | ICD-10-CM

## 2017-08-22 DIAGNOSIS — E663 Overweight: Secondary | ICD-10-CM | POA: Diagnosis not present

## 2017-08-22 DIAGNOSIS — K3184 Gastroparesis: Secondary | ICD-10-CM | POA: Diagnosis not present

## 2017-08-22 DIAGNOSIS — R5382 Chronic fatigue, unspecified: Secondary | ICD-10-CM

## 2017-08-22 DIAGNOSIS — E1143 Type 2 diabetes mellitus with diabetic autonomic (poly)neuropathy: Secondary | ICD-10-CM

## 2017-08-22 DIAGNOSIS — K5909 Other constipation: Secondary | ICD-10-CM

## 2017-08-22 DIAGNOSIS — E118 Type 2 diabetes mellitus with unspecified complications: Secondary | ICD-10-CM | POA: Diagnosis not present

## 2017-08-22 DIAGNOSIS — E7800 Pure hypercholesterolemia, unspecified: Secondary | ICD-10-CM | POA: Diagnosis not present

## 2017-08-22 DIAGNOSIS — M791 Myalgia, unspecified site: Secondary | ICD-10-CM | POA: Diagnosis not present

## 2017-08-22 LAB — BASIC METABOLIC PANEL
BUN: 21 mg/dL (ref 6–23)
CALCIUM: 9.8 mg/dL (ref 8.4–10.5)
CO2: 24 meq/L (ref 19–32)
CREATININE: 0.61 mg/dL (ref 0.40–1.20)
Chloride: 102 mEq/L (ref 96–112)
GFR: 110.91 mL/min (ref >60.00)
GLUCOSE: 175 mg/dL — AB (ref 70–99)
Potassium: 4.9 mEq/L (ref 3.5–5.1)
SODIUM: 137 meq/L (ref 135–145)

## 2017-08-22 LAB — TSH: TSH: 1.65 u[IU]/mL (ref 0.35–4.50)

## 2017-08-22 LAB — MICROALBUMIN / CREATININE URINE RATIO
Creatinine,U: 57.7 mg/dL
MICROALB/CREAT RATIO: 39.6 mg/g — AB (ref 0.0–30.0)
Microalb, Ur: 22.9 mg/dL — ABNORMAL HIGH (ref 0.0–1.9)

## 2017-08-22 LAB — HEMOGLOBIN A1C: Hgb A1c MFr Bld: 10.3 % — ABNORMAL HIGH (ref 4.6–6.5)

## 2017-08-22 NOTE — Patient Instructions (Signed)
Stop your atorvastatin for 1 month.  If your body aches and fatigue improve significantly, restart this med and if same symptoms return then stop this medicine for good.  Buy over the counter generic senakot-S (senna plus is a common generic).

## 2017-08-22 NOTE — Addendum Note (Signed)
Addended by: Tammi Sou on: 08/22/2017 11:48 AM   Modules accepted: Orders

## 2017-08-22 NOTE — Progress Notes (Signed)
OFFICE VISIT  08/22/2017   CC:  Chief Complaint  Patient presents with  . Follow-up    RCI, pt is not fasting.      HPI:    Patient is a 47 y.o. Caucasian female who presents for 3 mo f/u DM 2, HLD, hx of white coat HTN. Starts out immediately stating her bone, muscle, and joint pain has been more problematic last couple months. Started getting worse after getting on atorvastatin.  No redness or swelling of any joints.   Shoulders, hips, knees hurt--says palpation of the areas do not hurt, though. Abd distention--has diabetic gastroparesis--and she notes abdomen getting more distended, still gets nauseous---on and off some.  Says people have asked her when she is going to have her baby. She showed me a picture from last night she does look pregnant.  Abd has gone down some today.  DM: no home glucose monitoring lately--140s-200s prior. No exercising lately at all due to body pain.  HLD: taking atorva 3-4 days per week-->? Myalgias.  BPs: no home bp's.  ROS: No fevers, no rash, no oral ulcers, nonspecific malaise/fatigue last couple months. +Constipation--using prunes, probiotic--no help.  No vag bleeding.  No melena/hematochezia.  Past Medical History:  Diagnosis Date  . Benign brain tumor ([place])    hamartoma or low grade glioma per Dr. Trenton Gammon: stable MRI 02/2016.  Dr. Trenton Gammon with plan for surveillance MRI brain 02/2017.  Marland Kitchen Gluten intolerance    pt reports she underwent full GI w/u to r/o celiac dz  . Hyperlipidemia, mixed   . Nonproliferative diabetic retinopathy of both eyes without macular edema (HCC)    Moderate as of 02/2016 (Dr. Nicki Reaper)  . Sensorineural hearing loss of left ear    Sudden left hearing loss summer 2016--no improvement with steroids 01/2015 so brain MRI done by Dr. Redmond Baseman and it showed brain tumor that was determined to be benign.  Pt's hearing not bad enough for hearing aid as of 06/2016.  . Type 2 diabetes with complication ([place])    diab retpthy, diabetic  gastroparesis (gastric emptying study mildly abnl 03/2017)  . White coat hypertension     Past Surgical History:  Procedure Laterality Date  . CHOLECYSTECTOMY  2000  . GASTRIC EMPTYING SCAN  04/20/2017   Mildly abnormal, particularly the 1st hour of emptying.    Outpatient Medications Prior to Visit  Medication Sig Dispense Refill  . glipiZIDE (GLUCOTROL XL) 10 MG 24 hr tablet TAKE 1 TABLET BY MOUTH WITH BREAKFAST 30 tablet 0  . HYDROcodone-acetaminophen (NORCO/VICODIN) 5-325 MG tablet Take 1-2 tablets by mouth every 6 (six) hours as needed for moderate pain. 40 tablet 0  . INVOKANA 100 MG TABS tablet TAKE 1 TABLET BY MOUTH BEFORE BREAKFAST 90 tablet 0  . metFORMIN (GLUCOPHAGE-XR) 500 MG 24 hr tablet TAKE 2 TABLETS BY MOUTH TWICE DAILY 120 tablet 0  . pantoprazole (PROTONIX) 40 MG tablet TAKE 1 TABLET BY MOUTH ONCE DAILY 30 tablet 3  . promethazine (PHENERGAN) 12.5 MG tablet 1-2 tabs po q6h prn nausea 30 tablet 1  . atorvastatin (LIPITOR) 40 MG tablet Take 1 tablet (40 mg total) by mouth daily. 90 tablet 1   No facility-administered medications prior to visit.     Allergies  Allergen Reactions  . Gluten Meal Swelling  . Pioglitazone Other (See Comments)    ELEVATED glucoses + worse chronic nausea    ROS As per HPI  PE: Blood pressure 137/86, pulse (!) 105, temperature 98.3 F (36.8 C), temperature  source Oral, resp. rate 16, height 5\' 3"  (1.6 m), weight 159 lb 6 oz (72.3 kg), last menstrual period 07/31/2017, SpO2 98 %. Body mass index is 28.23 kg/m.  Gen: Alert, well appearing.  Patient is oriented to person, place, time, and situation. AFFECT: pleasant, lucid thought and speech. JJK:KXFG: no injection, icteris, swelling, or exudate.  EOMI, PERRLA. Mouth: lips without lesion/swelling.  Oral mucosa pink and moist. Oropharynx without erythema, exudate, or swelling.  Neck - No masses or thyromegaly or limitation in range of motion CV: RR, mildly tachycardic, no m/r/g.    LUNGS: CTA bilat, nonlabored resps, good aeration in all lung fields. ABD: moderate soft distention, BS hypoactive, nontender.  No mass or HSM or bruit. EXT: no clubbing, cyanosis, or edema.  Musc: no joint swelling, erythema, or warmth or tenderness.  Minimal tenderness to palpation of deltoids, upper traps, greater troch regions. SKIN: no rash.  LABS:   Lab Results  Component Value Date   ESRSEDRATE 24 (H) 03/15/2017    Lab Results  Component Value Date   TSH 1.61 01/18/2015   Lab Results  Component Value Date   WBC 8.2 03/15/2017   HGB 12.7 03/15/2017   HCT 39.1 03/15/2017   MCV 85.3 03/15/2017   PLT 428.0 (H) 03/15/2017   Lab Results  Component Value Date   CREATININE 0.51 03/15/2017   BUN 11 03/15/2017   NA 138 03/15/2017   K 4.5 03/15/2017   CL 100 03/15/2017   CO2 30 03/15/2017   Lab Results  Component Value Date   ALT 33 03/15/2017   AST 21 03/15/2017   ALKPHOS 70 03/15/2017   BILITOT 0.2 03/15/2017   Lab Results  Component Value Date   CHOL 151 07/19/2016   Lab Results  Component Value Date   HDL 34.50 (L) 07/19/2016   No results found for: [facility] Lab Results  Component Value Date   TRIG 276.0 (H) 07/19/2016   Lab Results  Component Value Date   CHOLHDL 4 07/19/2016   Lab Results  Component Value Date   HGBA1C 8.6 05/16/2017    IMPRESSION AND PLAN:  1) Body aches and fatigue: could be statin side effect.   Instructions: Stop your atorvastatin for 1 month.  If your body aches and fatigue improve significantly, restart this med and if same symptoms return then stop this medicine for good.  If symptoms don't change off of atorvastatin then restart this med and continue as before. No sign of rheumatologic dz on exam. Check TSH.  2) Abdominal distention: she does have diabetic gastroparesis, and this could be getting worse due to poorer control of DM lately.  Will check abd u/s and also order referral to GI.  3) Constipation: start  generic senakot S 2 tabs qhs.  4) DM 2: control has not been very good, although glucoses seem improved since getting on invokana. Will increase invokana to 300 mg qd if A1c not improved significantly. Check A1c and urine microalbumin/cr today. She is aware that she needs to schedule a diab retpthy screening exam.  5) HLD: last lipid panel 06/2017 showed low HDL and mildly elevated trigs, Chol/HDL ratio of 4. No repeat today---will see what happens with her atorvastatin (see #1 above).  An After Visit Summary was printed and given to the patient.  FOLLOW UP: Return in about 3 months (around 11/22/2017) for routine chronic illness f/u.  Signed:  Crissie Sickles, MD           08/22/2017

## 2017-08-24 ENCOUNTER — Other Ambulatory Visit: Payer: Self-pay | Admitting: Family Medicine

## 2017-08-24 MED ORDER — CANAGLIFLOZIN 300 MG PO TABS: 300.0000 mg | ORAL_TABLET | Freq: Every day | ORAL | 3 refills | Status: DC

## 2017-08-24 MED ORDER — LISINOPRIL 5 MG PO TABS: 5.0000 mg | ORAL_TABLET | Freq: Every day | ORAL | 3 refills | Status: DC

## 2017-09-03 ENCOUNTER — Other Ambulatory Visit: Payer: Self-pay | Admitting: Family Medicine

## 2017-09-04 ENCOUNTER — Encounter: Payer: Self-pay | Admitting: Family Medicine

## 2017-09-05 ENCOUNTER — Other Ambulatory Visit: Payer: BLUE CROSS/BLUE SHIELD

## 2017-09-12 ENCOUNTER — Encounter: Payer: Self-pay | Admitting: Family Medicine

## 2017-09-12 ENCOUNTER — Ambulatory Visit (INDEPENDENT_AMBULATORY_CARE_PROVIDER_SITE_OTHER): Payer: BLUE CROSS/BLUE SHIELD

## 2017-09-12 DIAGNOSIS — Z9049 Acquired absence of other specified parts of digestive tract: Secondary | ICD-10-CM | POA: Diagnosis not present

## 2017-09-12 DIAGNOSIS — R140 Abdominal distension (gaseous): Secondary | ICD-10-CM | POA: Diagnosis not present

## 2017-09-12 DIAGNOSIS — K3184 Gastroparesis: Secondary | ICD-10-CM

## 2017-09-12 DIAGNOSIS — E1143 Type 2 diabetes mellitus with diabetic autonomic (poly)neuropathy: Secondary | ICD-10-CM

## 2017-09-21 ENCOUNTER — Encounter: Payer: Self-pay | Admitting: Physician Assistant

## 2017-09-26 ENCOUNTER — Other Ambulatory Visit: Payer: Self-pay | Admitting: Family Medicine

## 2017-10-24 ENCOUNTER — Ambulatory Visit: Payer: BLUE CROSS/BLUE SHIELD | Admitting: Physician Assistant

## 2017-11-07 ENCOUNTER — Ambulatory Visit: Payer: BLUE CROSS/BLUE SHIELD | Admitting: Physician Assistant

## 2017-11-07 ENCOUNTER — Encounter: Payer: Self-pay | Admitting: Physician Assistant

## 2017-11-07 VITALS — BP 140/80 | HR 106 | Ht 64.0 in | Wt 160.0 lb

## 2017-11-07 DIAGNOSIS — R140 Abdominal distension (gaseous): Secondary | ICD-10-CM

## 2017-11-07 DIAGNOSIS — K30 Functional dyspepsia: Secondary | ICD-10-CM | POA: Diagnosis not present

## 2017-11-07 DIAGNOSIS — R110 Nausea: Secondary | ICD-10-CM | POA: Diagnosis not present

## 2017-11-07 NOTE — Patient Instructions (Signed)
You have been scheduled for an endoscopy. Please follow written instructions given to you at your visit today. If you use inhalers (even only as needed), please bring them with you on the day of your procedure. Your physician has requested that you go to www.startemmi.com and enter the access code given to you at your visit today. This web site gives a general overview about your procedure. However, you should still follow specific instructions given to you by our office regarding your preparation for the procedure.  Try Miralax once a day for constipation.

## 2017-11-07 NOTE — Progress Notes (Signed)
Agree with assessment and plan as outlined.  

## 2017-11-07 NOTE — Progress Notes (Signed)
Chief Complaint: Nausea, vomiting, delayed gastric emptying epigastric discomfort  HPI:    [name] is a 47 year old female with a past medical history as listed below including uncontrolled type 2 diabetes, who was referred to me by Tammi Sou, MD for a complaint of delayed gastric emptying, nausea, vomiting and epigastric discomfort.      04/19/2017 gastric emptying study revealed initial slow emptying from the stomach, values at 2 and 3 hours were within normal limits, slight delayed emptying at 4 hours, overall mildly abnormal.    /26/19 office visit with PCP, they discussed uncontrolled blood sugars.    09/12/2017 abdominal ultrasound complete showing prior cholecystectomy and increased hepatic echogenicity as can be seen with hepatic steatosis.    Today, the patient presents to clinic and explains that over the past year she has had some problems with nausea and vomiting, this has gotten a lot better since she was started on Pantoprazole once daily about 3 months ago or so.  Before this was having to use Phenergan "all the time" and in the past month has only had to use this "a time or two".  Continues to describe an epigastric bloating that feels "very heavy like a ball is in there".  This is some worse after meals and definitely worse after big meals.  Patient has started trying to cut carbs after 3 PM and eat a lighter supper which has also helped with her symptoms.  Apparently in the past was told by a allergy specialist that she may be gluten intolerant.  Patient only abides by this 50% of the time.  Does not feel like gluten makes a change in her symptoms.    Also describes some decreased bowel movement recently, has used Senokot on 2 occasions but "I do not like a how harsh this is".    Denies fever, chills, weight loss, anorexia, heartburn, reflux or symptoms that awaken her from sleep.  Past Medical History:  Diagnosis Date  . Benign brain tumor ([place])    Cystic lesion in  cerebral aqueduct region with mild hydrocephalus-- stable MRI 02/2016.  Surveillance MRI 05/2017 --dilated cerebral aqueduct related to aqueductal stenosis and subsequent mild hydrocephalus (due to the 11 mm stable cystic lesion in cerebral aqueduct---?congenitial?.  . Gluten intolerance    pt reports she underwent full GI w/u to r/o celiac dz  . Hepatic steatosis    ultrasound 08/2017  . Hyperlipidemia, mixed   . Nonproliferative diabetic retinopathy of both eyes without macular edema (HCC)    Moderate as of 02/2016 (Dr. Nicki Reaper)  . Sensorineural hearing loss of left ear    Sudden left hearing loss summer 2016--no improvement with steroids 01/2015 so brain MRI done by Dr. Redmond Baseman and it showed brain tumor that was determined to be benign.  Pt's hearing not bad enough for hearing aid as of 06/2016.  . Type 2 diabetes with complication ([place])    diab retpthy, diabetic gastroparesis (gastric emptying study mildly abnl 03/2017)  . White coat hypertension     Past Surgical History:  Procedure Laterality Date  . CHOLECYSTECTOMY  2000  . GASTRIC EMPTYING SCAN  04/20/2017   Mildly abnormal, particularly the 1st hour of emptying.    Current Outpatient Medications  Medication Sig Dispense Refill  . canagliflozin (INVOKANA) 300 MG TABS tablet Take 1 tablet (300 mg total) by mouth daily before breakfast. 30 tablet 3  . glipiZIDE (GLUCOTROL XL) 10 MG 24 hr tablet TAKE 1 TABLET BY MOUTH WITH BREAKFAST  90 tablet 0  . HYDROcodone-acetaminophen (NORCO/VICODIN) 5-325 MG tablet Take 1-2 tablets by mouth every 6 (six) hours as needed for moderate pain. 40 tablet 0  . lisinopril (PRINIVIL,ZESTRIL) 5 MG tablet Take 1 tablet (5 mg total) by mouth daily. 30 tablet 3  . metFORMIN (GLUCOPHAGE-XR) 500 MG 24 hr tablet TAKE 2 TABLETS BY MOUTH TWICE DAILY 360 tablet 0  . pantoprazole (PROTONIX) 40 MG tablet TAKE 1 TABLET BY MOUTH ONCE DAILY 30 tablet 3  . promethazine (PHENERGAN) 12.5 MG tablet 1-2 tabs po q6h prn nausea 30  tablet 1   No current facility-administered medications for this visit.     Allergies as of 11/07/2017 - Review Complete 11/07/2017  Allergen Reaction Noted  . Gluten meal Swelling 02/12/2015  . Pioglitazone Other (See Comments) 03/15/2017    Family History  Problem Relation Age of Onset  . Brain cancer Mother   . Diabetes Father   . Diabetes Maternal Grandmother   . Cervical cancer Paternal Grandmother   . Colon cancer Maternal Grandfather 23    Social History   Socioeconomic History  . Marital status: Married    Spouse name: Not on file  . Number of children: Not on file  . Years of education: Not on file  . Highest education level: Not on file  Occupational History  . Not on file  Social Needs  . Financial resource strain: Not on file  . Food insecurity:    Worry: Not on file    Inability: Not on file  . Transportation needs:    Medical: Not on file    Non-medical: Not on file  Tobacco Use  . Smoking status: Never Smoker  . Smokeless tobacco: Never Used  Substance and Sexual Activity  . Alcohol use: No  . Drug use: No  . Sexual activity: Not on file  Lifestyle  . Physical activity:    Days per week: Not on file    Minutes per session: Not on file  . Stress: Not on file  Relationships  . Social connections:    Talks on phone: Not on file    Gets together: Not on file    Attends religious service: Not on file    Active member of club or organization: Not on file    Attends meetings of clubs or organizations: Not on file    Relationship status: Not on file  . Intimate partner violence:    Fear of current or ex partner: Not on file    Emotionally abused: Not on file    Physically abused: Not on file    Forced sexual activity: Not on file  Other Topics Concern  . Not on file  Social History Narrative   Married, no children..   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       Occup: [place] for [employer]   No T/A/Ds.     Review of Systems:    Constitutional: No weight loss, fever or chills Skin: No rash  Cardiovascular: No chest pain Respiratory: No SOB  Gastrointestinal: See HPI and otherwise negative Genitourinary: No dysuria  Neurological: No headache, dizziness or syncope Musculoskeletal: No new muscle or joint pain Hematologic: No bleeding  Psychiatric: No history of depression or anxiety   Physical Exam:  Vital signs: BP 140/80   Pulse (!) 106   Ht 5\' 4"  (1.626 m)   Wt 160 lb (72.6 kg)   LMP 10/11/2017 (Approximate)   BMI 27.46 kg/m  Constitutional:   Pleasant Caucasian female appears to be in NAD, Well developed, Well nourished, alert and cooperative Head:  Normocephalic and atraumatic. Eyes:   PEERL, EOMI. No icterus. Conjunctiva pink. Ears:  Normal auditory acuity. Neck:  Supple Throat: Oral cavity and pharynx without inflammation, swelling or lesion.  Respiratory: Respirations even and unlabored. Lungs clear to auscultation bilaterally.   No wheezes, crackles, or rhonchi.  Cardiovascular: Normal S1, S2. No MRG. Regular rate and rhythm. No peripheral edema, cyanosis or pallor.  Gastrointestinal:  Soft, nondistended, mild epigastric ttp. No rebound or guarding. Decreased BS all four quadrnats. No appreciable masses or hepatomegaly. Rectal:  Not performed.  Msk:  Symmetrical without gross deformities. Without edema, no deformity or joint abnormality.  Neurologic:  Alert and  oriented x4;  grossly normal neurologically.  Skin:   Dry and intact without significant lesions or rashes. Psychiatric: Demonstrates good judgement and reason without abnormal affect or behaviors.  RELEVANT LABS AND IMAGING: CBC    Component Value Date/Time   WBC 8.2 03/15/2017 1337   RBC 4.58 03/15/2017 1337   HGB 12.7 03/15/2017 1337   HCT 39.1 03/15/2017 1337   PLT 428.0 (H) 03/15/2017 1337   MCV 85.3 03/15/2017 1337   MCHC 32.4 03/15/2017 1337   RDW 15.1 03/15/2017 1337   LYMPHSABS 1.7  03/15/2017 1337   MONOABS 0.4 03/15/2017 1337   EOSABS 0.5 03/15/2017 1337   BASOSABS 0.1 03/15/2017 1337    CMP     Component Value Date/Time   NA 137 08/22/2017 1119   K 4.9 08/22/2017 1119   CL 102 08/22/2017 1119   CO2 24 08/22/2017 1119   GLUCOSE 175 (H) 08/22/2017 1119   BUN 21 08/22/2017 1119   CREATININE 0.61 08/22/2017 1119   CALCIUM 9.8 08/22/2017 1119   PROT 6.6 03/15/2017 1337   ALBUMIN 4.1 03/15/2017 1337   AST 21 03/15/2017 1337   ALT 33 03/15/2017 1337   ALKPHOS 70 03/15/2017 1337   BILITOT 0.2 03/15/2017 1337    Assessment: 1.  Gastric distention: Continues per patient, likely related to gastroparesis 2.  Delayed gastric emptying: Delayed 4-hour study back in February, glucose remains uncontrolled; likely patient symptoms are from gastroparesis 3.  Nausea and vomiting: Better with Pantoprazole 40 mg daily  Plan: 1.  Scheduled patient for an EGD with Dr. Havery Moros in [facility].  Did discuss risk, benefits, limitations and alternatives and patient agrees to proceed. 2.  Provided the patient with a gastroparesis diet handout.  Discussed smaller more frequent meals low on oral fiber. 3.  Would recommend patient use MiraLAX as needed for constipation instead of Senokot. 4.  Discussed some of the medications used for gastroparesis, patient would like to try and avoid these if possible. 5.  Patient to follow in clinic per recommendations from Dr. Havery Moros after time of procedure.  Ellouise Newer, PA-C [facility] 11/07/2017, 11:27 AM  Cc: McGowen, Adrian Blackwater, MD

## 2017-11-14 ENCOUNTER — Encounter: Payer: Self-pay | Admitting: Family Medicine

## 2017-11-16 ENCOUNTER — Encounter (INDEPENDENT_AMBULATORY_CARE_PROVIDER_SITE_OTHER): Payer: Self-pay | Admitting: Ophthalmology

## 2017-11-16 ENCOUNTER — Ambulatory Visit (INDEPENDENT_AMBULATORY_CARE_PROVIDER_SITE_OTHER): Payer: BLUE CROSS/BLUE SHIELD | Admitting: Ophthalmology

## 2017-11-16 DIAGNOSIS — E113513 Type 2 diabetes mellitus with proliferative diabetic retinopathy with macular edema, bilateral: Secondary | ICD-10-CM | POA: Diagnosis not present

## 2017-11-16 DIAGNOSIS — H4311 Vitreous hemorrhage, right eye: Secondary | ICD-10-CM

## 2017-11-16 DIAGNOSIS — I10 Essential (primary) hypertension: Secondary | ICD-10-CM

## 2017-11-16 DIAGNOSIS — H3581 Retinal edema: Secondary | ICD-10-CM | POA: Diagnosis not present

## 2017-11-16 DIAGNOSIS — H35033 Hypertensive retinopathy, bilateral: Secondary | ICD-10-CM

## 2017-11-16 DIAGNOSIS — H25813 Combined forms of age-related cataract, bilateral: Secondary | ICD-10-CM

## 2017-11-16 MED ORDER — PREDNISOLONE ACETATE 1 % OP SUSP: 1.0000 [drp] | Freq: Four times a day (QID) | OPHTHALMIC | 0 refills | Status: AC

## 2017-11-16 NOTE — Progress Notes (Signed)
[facility] Note  11/16/2017     CHIEF COMPLAINT Patient presents for Diabetic Eye Exam   HISTORY OF PRESENT ILLNESS: Gloria Gomez is a 47 y.o. female who presents to the clinic today for:   HPI    Diabetic Eye Exam    Vision is blurred for near and is blurred for distance.  Associated Symptoms Flashes and Floaters.  Negative for Blind Spot, Photophobia, Scalp Tenderness, Fever, Jaw Claudication, Pain, Weight Loss, Fatigue, Shoulder/Hip pain, Trauma, Redness and Distortion.  Diabetes characteristics include Type 2.  This started 8.  Blood sugar level is controlled.  Last Blood Glucose 142.  Last A1C 10.  I, the attending physician,  performed the HPI with the patient and updated documentation appropriately.          Comments    Pt presents for DM exam, pt is type 2 diabetic dx 8 years ago, pt states A1C was 8.5 and then went back up to 10.0, she states she has it checked again in about 2 weeks, pts blood sugar was 142 this morning, but it was 110 yesterday morning, pt states she got good control over her blood sugar about 6-8 weeks, she is taking 300mg  invokana, 10mg  of glipizide, 1000 2x a day metforming and protonix 40 mg once a day, she has also changed her diet and does not eat carbs after 3:00 PM, pt states OD is very blurry, she is having flashes and floaters, states she feels like she has lost 90% of her vision in that eye and it came on very suddenly, pt is due for routine exam in October       Last edited by Bernarda Caffey, MD on 11/16/2017 10:51 AM. (History)    Pt states she began to have fuzziness in OD x 1 day ago; Pt states she was seeing spider webs before blurred  VA came on; Pt states she has had floaters before but nothing to this extent; Pt endorses she is diabetic, states A1C has been over 10 for a while; Pt states since the rise in the A1Cs she has increased in DM medications; Pt states she she was having routine eye exam by Dr. Mart Piggs; Pt states she moved to [facility] x 2 years ago; Pt states she "googled emergency diabetic eye doctor" and Dr.Hae Ahlers came up;   Referring physician: Tammi Sou, MD [address] [place], [place] [address]  HISTORICAL INFORMATION:   Selected notes from the MEDICAL RECORD NUMBER Referred for DM exam LEE:  Ocular Hx-NPDR PMH-DM (takes invokana and metformin), high cholesterol, benign brain tumor, white coat HTN    CURRENT MEDICATIONS: Current Outpatient Medications (Ophthalmic Drugs)  Medication Sig  . prednisoLONE acetate (PRED FORTE) 1 % ophthalmic suspension Place 1 drop into the left eye 4 (four) times daily for 7 days.   No current facility-administered medications for this visit.  (Ophthalmic Drugs)   Current Outpatient Medications (Other)  Medication Sig  . canagliflozin (INVOKANA) 300 MG TABS tablet Take 1 tablet (300 mg total) by mouth daily before breakfast.  . glipiZIDE (GLUCOTROL XL) 10 MG 24 hr tablet TAKE 1 TABLET BY MOUTH WITH BREAKFAST  . HYDROcodone-acetaminophen (NORCO/VICODIN) 5-325 MG tablet Take 1-2 tablets by mouth every 6 (six) hours as needed for moderate pain.  Marland Kitchen lisinopril (PRINIVIL,ZESTRIL) 5 MG tablet Take 1 tablet (5 mg total) by mouth daily.  . metFORMIN (GLUCOPHAGE-XR) 500 MG 24 hr tablet TAKE 2 TABLETS BY  MOUTH TWICE DAILY  . pantoprazole (PROTONIX) 40 MG tablet TAKE 1 TABLET BY MOUTH ONCE DAILY  . promethazine (PHENERGAN) 12.5 MG tablet 1-2 tabs po q6h prn nausea   Current Facility-Administered Medications (Other)  Medication Route  . Bevacizumab (AVASTIN) SOLN 1.25 mg Intravitreal      REVIEW OF SYSTEMS: ROS    Positive for: Endocrine, Cardiovascular, Eyes   Negative for: Constitutional, Gastrointestinal, Neurological, Skin, Genitourinary, Musculoskeletal, HENT, Respiratory, Psychiatric, Allergic/Imm, Heme/Lymph   Last edited by Debbrah Alar, COT on 11/16/2017  9:34 AM. (History)       ALLERGIES Allergies  Allergen  Reactions  . Gluten Meal Swelling  . Pioglitazone Other (See Comments)    ELEVATED glucoses + worse chronic nausea    PAST MEDICAL HISTORY Past Medical History:  Diagnosis Date  . Abdominal bloating    likely from diab gastroparesis.  Dr. Havery Moros to do EGD as of 10/2017 GI eval.  . Benign brain tumor ([place])    Cystic lesion in cerebral aqueduct region with mild hydrocephalus-- stable MRI 02/2016.  Surveillance MRI 05/2017 --dilated cerebral aqueduct related to aqueductal stenosis and subsequent mild hydrocephalus (due to the 11 mm stable cystic lesion in cerebral aqueduct---?congenitial?.  . Gluten intolerance    pt reports she underwent full GI w/u to r/o celiac dz  . Hepatic steatosis    ultrasound 08/2017  . Hyperlipidemia, mixed   . Nonproliferative diabetic retinopathy of both eyes without macular edema (HCC)    Moderate as of 02/2016 (Dr. Nicki Reaper)  . Sensorineural hearing loss of left ear    Sudden left hearing loss summer 2016--no improvement with steroids 01/2015 so brain MRI done by Dr. Redmond Baseman and it showed brain tumor that was determined to be benign.  Pt's hearing not bad enough for hearing aid as of 06/2016.  . Type 2 diabetes with complication ([place])    diab retpthy, diabetic gastroparesis (gastric emptying study mildly abnl 03/2017)  . White coat hypertension    Past Surgical History:  Procedure Laterality Date  . CHOLECYSTECTOMY  2000  . GASTRIC EMPTYING SCAN  04/20/2017   Mildly abnormal, particularly the 1st hour of emptying.    FAMILY HISTORY Family History  Problem Relation Age of Onset  . Brain cancer Mother   . Diabetes Father   . Diabetes Maternal Grandmother   . Cataracts Maternal Grandmother   . Cervical cancer Paternal Grandmother   . Colon cancer Maternal Grandfather 71  . Amblyopia Neg Hx   . Blindness Neg Hx   . Glaucoma Neg Hx   . Macular degeneration Neg Hx   . Retinal detachment Neg Hx   . Strabismus Neg Hx   . Retinitis pigmentosa Neg Hx      SOCIAL HISTORY Social History   Tobacco Use  . Smoking status: Never Smoker  . Smokeless tobacco: Never Used  Substance Use Topics  . Alcohol use: No  . Drug use: No         OPHTHALMIC EXAM:  Base Eye Exam    Visual Acuity (Snellen - Linear)      Right Left   Dist cc CF at face' 20/30 -2   Dist ph cc NI 20/25 -2   Correction:  Glasses       Tonometry (Tonopen, 9:47 AM)      Right Left   Pressure 23 20       Tonometry #2 (Tonopen, 10:24 AM)      Right Left   Pressure 22  Pupils      Dark Light Shape React APD   Right 5 3 Round Brisk None   Left 5 3 Round Brisk None       Visual Fields (Counting fingers)      Left Right    Full Full       Extraocular Movement      Right Left    Full, Ortho Full, Ortho       Neuro/Psych    Oriented x3:  Yes   Mood/Affect:  Normal       Dilation    Both eyes:  1.0% Mydriacyl, 2.5% Phenylephrine @ 9:47 AM        Slit Lamp and Fundus Exam    Slit Lamp Exam      Right Left   Lids/Lashes Dermatochalasis - upper lid, Meibomian gland dysfunction, Telangiectasia Dermatochalasis - upper lid, Meibomian gland dysfunction, Telangiectasia   Conjunctiva/Sclera White and quiet White and quiet   Cornea 1+ Punctate epithelial erosions Clear   Anterior Chamber Deep and quiet Deep and quiet   Iris Round and dilated, No NVI Round and dilated, No NVI   Lens 2+ Nuclear sclerosis, 2+ Cortical cataract 2+ Nuclear sclerosis, 2+ Cortical cataract   Vitreous Vitreous syneresis, +diffuse VH, VH clumping inferiorly Vitreous syneresis       Fundus Exam      Right Left   Disc Hazy view, no detail visible, perfussed +fine NVD temporal quadrant, sharp and pink rim   C/D Ratio  0.1   Macula Hazy view Blunted foveal reflex, cluster Microaneurysms and Exudates temporal to fovea, +edema   Vessels Hazy view, NVE, venous beading peripherally Mld venous beading   Periphery Hazy view, grosly attached, scattered DBH 360 Attached, +NVE IN  arcades    Poor view in right eye        Refraction    Wearing Rx      Sphere Cylinder   Right -3.75 Sphere   Left -3.75 Sphere       Manifest Refraction      Sphere Cylinder Axis Dist VA   Right       Left -4.25 +0.50 018 20/25-2          IMAGING AND PROCEDURES  Imaging and Procedures for @TODAY @  OCT, Retina - OU - Both Eyes       Right Eye Quality was good. Central Foveal Thickness: (Unable to obtain). Progression has no prior data.   Left Eye Quality was good. Central Foveal Thickness: 345. Progression has no prior data. Findings include abnormal foveal contour, intraretinal fluid, no SRF, vitreomacular adhesion  (Pre-retinal fibrosis overlying disc).   Notes *Images captured and stored on drive  Diagnosis / Impression:  OD: no scan obtained due to [facility] OS: abnormal foveal contour; +IRF; preretinal fibrosis about the disc  Clinical management:  See below  Abbreviations: NFP - Normal foveal profile. CME - cystoid macular edema. PED - pigment epithelial detachment. IRF - intraretinal fluid. SRF - subretinal fluid. EZ - ellipsoid zone. ERM - epiretinal membrane. ORA - outer retinal atrophy. ORT - outer retinal tubulation. SRHM - subretinal hyper-reflective material         Fluorescein Angiography Optos (Transit OS)       Right Eye   Progression has no prior data. Early phase findings include blockage, retinal neovascularization, vascular perfusion defect, microaneurysm, leakage. Mid/Late phase findings include blockage, retinal neovascularization, vascular perfusion defect, leakage, microaneurysm.   Left Eye   Progression has no prior data.  Early phase findings include retinal neovascularization, vascular perfusion defect, microaneurysm, normal observations, leakage. Mid/Late phase findings include retinal neovascularization, vascular perfusion defect, microaneurysm, leakage.   Notes Images captured and stored on drive;   Impression: PDR OU OD:  central blockage consistent with VH; peripheral MA, NV w/ leakage and capillary nonperfusion OS: MA, +NVD, +NVE -- all with leakage; significant areas of capillary nonperfusion -- greatest inferiorly        Intravitreal Injection, Pharmacologic Agent - OD - Right Eye       Time Out 11/16/2017. 12:15 PM. Confirmed correct patient, procedure, site, and patient consented.   Anesthesia Topical anesthesia was used. Anesthetic medications included Lidocaine 2%, Proparacaine 0.5%.   Procedure Preparation included 5% betadine to ocular surface, eyelid speculum. A supplied needle was used.   Injection:  1.25 mg Bevacizumab 1.25mg /0.13ml   NDC: 50242-060-01, Lot: [id number] , Expiration date: 12/29/2017   Route: Intravitreal, Site: Right Eye, Waste: 0 mg  Post-op Post injection exam found visual acuity of at least counting fingers. The patient tolerated the procedure well. There were no complications. The patient received written and verbal post procedure care education.        Panretinal Photocoagulation - OS - Left Eye       LASER PROCEDURE NOTE  Diagnosis:   Proliferative Diabetic Retinopathy, LEFT EYE  Procedure:  Pan-retinal photocoagulation using slit lamp laser, LEFT EYE  Anesthesia:  Topical  Surgeon: 24/03/2017, MD, PhD   Informed consent obtained, operative eye marked, and time out performed prior to initiation of laser.   Lumenis Bernarda Caffey slit lamp laser Pattern: 3x3 square Power: 300 mW Duration: 30 msec  Spot size: 200 microns  # spots: [id number] spots  Complications: None.  RTC: 1 wk  Patient tolerated the procedure well and received written and verbal post-procedure care information/education.                  ASSESSMENT/PLAN:    ICD-10-CM   1. Vitreous hemorrhage of right eye (HCC) H43.11 Intravitreal Injection, Pharmacologic Agent - OD - Right Eye    Bevacizumab (AVASTIN) SOLN 1.25 mg  2. Proliferative diabetic retinopathy of both eyes  with macular edema associated with type 2 diabetes mellitus (HCC) 2778 Fluorescein Angiography Optos (Transit OS)    Intravitreal Injection, Pharmacologic Agent - OD - Right Eye    Panretinal Photocoagulation - OS - Left Eye    Bevacizumab (AVASTIN) SOLN 1.25 mg  3. Retinal edema H35.81 OCT, Retina - OU - Both Eyes  4. Hypertensive retinopathy of both eyes H35.033 Fluorescein Angiography Optos (Transit OS)  5. Essential hypertension I10     1. Vitreous Hemorrhage OD-  - secondary to PDR as described below - discussed findings, prognosis and treatment options - recommend IVA OD #1 - pt wishes to proceed with IVA OD - RBA of procedure discussed, questions answered - informed consent obtained and signed - see procedure note - VH precautions reviewed -- minimize activities, keep head elevated, avoid ASA/NSAIDs/blood thinners as able - f/u in 1 wk -- possible PRP OD  2,3. Proliferative diabetic retinopathy w/ DME, OU - The incidence, risk factors for progression, natural history and treatment options for diabetic retinopathy were discussed with patient.   - The need for close monitoring of blood glucose, blood pressure, and serum lipids, avoiding cigarette or any type of tobacco, and the need for long term follow up was also discussed with patient. - exam shows OD with dense central VH and peripheral NV and cap nonperfusion  OS -- +NVD, +NVE, scattered MA and exudates and +DME - FA today (9.20.19) shows +NVE OU and leaking MA and capillary nonperfusion - OCT confirms diabetic macular edema OS - discussed findings and prognosis - recommend PRP OU, OS first today as OD obscured by [facility] - pt wishes to proceed with laser treatment today 9.20.19 - RBA of procedure discussed, questions answered - informed consent obtained and signed - see procedure note - start PF QID OS x7 days - F/U 1 week  4,5. Hypertensive retinopathy OU - discussed importance of tight BP control - monitor  6.  Combined form age related cataract OU-  - The symptoms of cataract, surgical options, and treatments and risks were discussed with patient. - discussed diagnosis and progression - not yet visually significant - monitor for now   Ophthalmic Meds Ordered this visit:  Meds ordered this encounter  Medications  . prednisoLONE acetate (PRED FORTE) 1 % ophthalmic suspension    Sig: Place 1 drop into the left eye 4 (four) times daily for 7 days.    Dispense:  10 mL    Refill:  0  . Bevacizumab (AVASTIN) SOLN 1.25 mg       Return in about 1 week (around 11/23/2017) for F/u PDR OU, DFE, OCT.  There are no Patient Instructions on file for this visit.   Explained the diagnoses, plan, and follow up with the patient and they expressed understanding.  Patient expressed understanding of the importance of proper follow up care.   This document serves as a record of services personally performed by Gardiner Sleeper, MD, PhD. It was created on their behalf by Ernest Mallick, OA, an ophthalmic assistant. The creation of this record is the provider's dictation and/or activities during the visit.    Electronically signed by: Ernest Mallick, OA  09.20.19 12:09 AM   This document serves as a record of services personally performed by Gardiner Sleeper, MD, PhD. It was created on their behalf by Catha Brow, [place], a certified ophthalmic assistant. The creation of this record is the provider's dictation and/or activities during the visit.  Electronically signed by: Catha Brow, [place]  09.20.19 12:09 AM    Gardiner Sleeper, M.D., Ph.D. Diseases & Surgery of the Retina and Vitreous [facility]   I have reviewed the above documentation for accuracy and completeness, and I agree with the above. Gardiner Sleeper, M.D., Ph.D. 11/18/17 12:09 AM    Abbreviations: M myopia (nearsighted); A astigmatism; H hyperopia (farsighted); P presbyopia; Mrx spectacle prescription;  CTL contact  lenses; OD right eye; OS left eye; OU both eyes  XT exotropia; ET esotropia; PEK punctate epithelial keratitis; PEE punctate epithelial erosions; DES dry eye syndrome; MGD meibomian gland dysfunction; ATs artificial tears; PFAT's preservative free artificial tears; [place] nuclear sclerotic cataract; PSC posterior subcapsular cataract; ERM epi-retinal membrane; PVD posterior vitreous detachment; RD retinal detachment; DM diabetes mellitus; DR diabetic retinopathy; NPDR non-proliferative diabetic retinopathy; PDR proliferative diabetic retinopathy; CSME clinically significant macular edema; DME diabetic macular edema; dbh dot blot hemorrhages; CWS cotton wool spot; POAG primary open angle glaucoma; C/D cup-to-disc ratio; HVF humphrey visual field; GVF goldmann visual field; OCT optical coherence tomography; IOP intraocular pressure; BRVO Branch retinal vein occlusion; CRVO central retinal vein occlusion; CRAO central retinal artery occlusion; BRAO branch retinal artery occlusion; RT retinal tear; SB scleral buckle; PPV pars plana vitrectomy; VH Vitreous hemorrhage; PRP panretinal laser photocoagulation; IVK intravitreal kenalog; VMT vitreomacular traction; MH Macular hole;  NVD neovascularization of the disc; NVE neovascularization elsewhere; AREDS age related eye disease study; ARMD age related macular degeneration; POAG primary open angle glaucoma; EBMD epithelial/anterior basement membrane dystrophy; ACIOL anterior chamber intraocular lens; IOL intraocular lens; PCIOL posterior chamber intraocular lens; Phaco/IOL phacoemulsification with intraocular lens placement; [place] photorefractive keratectomy; LASIK laser assisted in situ keratomileusis; HTN hypertension; DM diabetes mellitus; COPD chronic obstructive pulmonary disease

## 2017-11-18 ENCOUNTER — Encounter (INDEPENDENT_AMBULATORY_CARE_PROVIDER_SITE_OTHER): Payer: Self-pay | Admitting: Ophthalmology

## 2017-11-18 DIAGNOSIS — H4311 Vitreous hemorrhage, right eye: Secondary | ICD-10-CM | POA: Diagnosis not present

## 2017-11-18 DIAGNOSIS — E113513 Type 2 diabetes mellitus with proliferative diabetic retinopathy with macular edema, bilateral: Secondary | ICD-10-CM | POA: Diagnosis not present

## 2017-11-18 MED ORDER — BEVACIZUMAB CHEMO INJECTION 1.25MG/0.05ML SYRINGE FOR KALEIDOSCOPE
1.2500 mg | INTRAVITREAL | Status: DC
  Administered 2017-11-18: 1.25 mg via INTRAVITREAL

## 2017-11-19 NOTE — Addendum Note (Signed)
Addended by: Gardiner Sleeper on: 11/19/2017 01:14 PM   Modules accepted: Level of Service

## 2017-11-20 ENCOUNTER — Encounter: Payer: Self-pay | Admitting: Family Medicine

## 2017-11-22 NOTE — Progress Notes (Signed)
[facility] Note  11/23/2017     CHIEF COMPLAINT Patient presents for Retina Follow Up   HISTORY OF PRESENT ILLNESS: Gloria Gomez is a 47 y.o. female who presents to the clinic today for:   HPI    Retina Follow Up    Patient presents with  Other.  In right eye.  This started 1 week ago.  Severity is mild.  Since onset it is gradually improving.  I, the attending physician,  performed the HPI with the patient and updated documentation appropriately.          Comments    F/U PDR OU. Patient states "black floaters have increased but her vision has improved" BS 159 this am.Bs have been [place] per patient        Last edited by Bernarda Caffey, MD on 11/23/2017 11:26 AM. (History)      Referring physician: Tammi Sou, MD [address] [place], [place] [address]  HISTORICAL INFORMATION:   Selected notes from the MEDICAL RECORD NUMBER Referred for DM exam LEE:  Ocular Hx-NPDR PMH-DM (takes invokana and metformin), high cholesterol, benign brain tumor, white coat HTN    CURRENT MEDICATIONS: No current outpatient medications on file. (Ophthalmic Drugs)   No current facility-administered medications for this visit.  (Ophthalmic Drugs)   Current Outpatient Medications (Other)  Medication Sig  . canagliflozin (INVOKANA) 300 MG TABS tablet Take 1 tablet (300 mg total) by mouth daily before breakfast.  . glipiZIDE (GLUCOTROL XL) 10 MG 24 hr tablet TAKE 1 TABLET BY MOUTH WITH BREAKFAST  . HYDROcodone-acetaminophen (NORCO/VICODIN) 5-325 MG tablet Take 1-2 tablets by mouth every 6 (six) hours as needed for moderate pain.  Marland Kitchen lisinopril (PRINIVIL,ZESTRIL) 5 MG tablet Take 1 tablet (5 mg total) by mouth daily.  . metFORMIN (GLUCOPHAGE-XR) 500 MG 24 hr tablet TAKE 2 TABLETS BY MOUTH TWICE DAILY  . pantoprazole (PROTONIX) 40 MG tablet TAKE 1 TABLET BY MOUTH ONCE DAILY  . promethazine (PHENERGAN) 12.5 MG tablet 1-2 tabs po q6h prn nausea   Current  Facility-Administered Medications (Other)  Medication Route  . Bevacizumab (AVASTIN) SOLN 1.25 mg Intravitreal  . Bevacizumab (AVASTIN) SOLN 1.25 mg Intravitreal      REVIEW OF SYSTEMS: ROS    Positive for: Endocrine, Eyes   Negative for: Constitutional, Gastrointestinal, Neurological, Skin, Genitourinary, Musculoskeletal, HENT, Cardiovascular, Respiratory, Psychiatric, Allergic/Imm, Heme/Lymph   Last edited by Zenovia Jordan, LPN on 5/39/7673 41:93 AM. (History)       ALLERGIES Allergies  Allergen Reactions  . Gluten Meal Swelling  . Pioglitazone Other (See Comments)    ELEVATED glucoses + worse chronic nausea    PAST MEDICAL HISTORY Past Medical History:  Diagnosis Date  . Abdominal bloating    likely from diab gastroparesis.  Dr. Havery Moros to do EGD as of 10/2017 GI eval.  . Benign brain tumor ([place])    Cystic lesion in cerebral aqueduct region with mild hydrocephalus-- stable MRI 02/2016.  Surveillance MRI 05/2017 --dilated cerebral aqueduct related to aqueductal stenosis and subsequent mild hydrocephalus (due to the 11 mm stable cystic lesion in cerebral aqueduct---?congenitial?.  . Gluten intolerance    pt reports she underwent full GI w/u to r/o celiac dz  . Hepatic steatosis    ultrasound 08/2017  . Hyperlipidemia, mixed   . Proliferative diabetic retinopathy of both eyes ([place])    steroid injections 10/2017  . Sensorineural hearing loss of left ear    Sudden left hearing loss summer  2016--no improvement with steroids 01/2015 so brain MRI done by Dr. Redmond Baseman and it showed brain tumor that was determined to be benign.  Pt's hearing not bad enough for hearing aid as of 06/2016.  . Type 2 diabetes with complication ([place])    diab retpthy, diabetic gastroparesis (gastric emptying study mildly abnl 03/2017)  . White coat hypertension    Past Surgical History:  Procedure Laterality Date  . CHOLECYSTECTOMY  2000  . GASTRIC EMPTYING SCAN  04/20/2017   Mildly abnormal,  particularly the 1st hour of emptying.    FAMILY HISTORY Family History  Problem Relation Age of Onset  . Brain cancer Mother   . Diabetes Father   . Diabetes Maternal Grandmother   . Cataracts Maternal Grandmother   . Cervical cancer Paternal Grandmother   . Colon cancer Maternal Grandfather 24  . Amblyopia Neg Hx   . Blindness Neg Hx   . Glaucoma Neg Hx   . Macular degeneration Neg Hx   . Retinal detachment Neg Hx   . Strabismus Neg Hx   . Retinitis pigmentosa Neg Hx     SOCIAL HISTORY Social History   Tobacco Use  . Smoking status: Never Smoker  . Smokeless tobacco: Never Used  Substance Use Topics  . Alcohol use: No  . Drug use: No         OPHTHALMIC EXAM:  Base Eye Exam    Visual Acuity (Snellen - Linear)      Right Left   Dist [place] 20/40 -2 20/40   Dist ph [place] NI    Correction:  Glasses       Tonometry (Tonopen, 10:21 AM)      Right Left   Pressure 12 14       Pupils      Dark Light Shape React APD   Right 4 3 Round Brisk None   Left 4 3 Round Brisk None       Visual Fields (Counting fingers)      Left Right    Full Full       Extraocular Movement      Right Left    Full, Ortho Full, Ortho       Neuro/Psych    Oriented x3:  Yes   Mood/Affect:  Normal       Dilation    Both eyes:  1.0% Mydriacyl, 2.5% Phenylephrine @ 10:20 AM        Slit Lamp and Fundus Exam    Slit Lamp Exam      Right Left   Lids/Lashes Dermatochalasis - upper lid, Meibomian gland dysfunction, Telangiectasia Dermatochalasis - upper lid, Meibomian gland dysfunction, Telangiectasia   Conjunctiva/Sclera White and quiet White and quiet   Cornea 1+ Punctate epithelial erosions Clear   Anterior Chamber Deep and quiet Deep and quiet   Iris Round and dilated, No NVI Round and dilated, No NVI   Lens 2+ Nuclear sclerosis, 2+ Cortical cataract 2+ Nuclear sclerosis, 2+ Cortical cataract   Vitreous Vitreous syneresis, +diffuse VH -- clearing and settling inferiorly, VH  clumping inferiorly, inferior blood clots Vitreous syneresis       Fundus Exam      Right Left   Disc Mildly hazy view, mildly blurred margin, ?Regressing fine NVD +fine NVD temporal quadrant, sharp and pink rim   C/D Ratio 0.2 0.1   Macula Hazy view, Blunted foveal reflex, scattered Microaneurysms, +edema, small area of exudates superior macula Blunted foveal reflex, cluster Microaneurysms and Exudates temporal to fovea, +  edema   Vessels Hazy view, NVE, venous beading peripherally, Vascular attenuation Mld venous beading   Periphery Hazy view from [facility], grossly attached, scattered DBH 360 Attached, +NVE IN arcades, scattered DBH, scattered IRH, good 360 PRP laser changes          IMAGING AND PROCEDURES  Imaging and Procedures for @TODAY @  OCT, Retina - OU - Both Eyes       Right Eye Quality was good. Central Foveal Thickness: 324. Progression has no prior data. Findings include abnormal foveal contour, intraretinal fluid, no SRF, epiretinal membrane (Vitreous opacities).   Left Eye Quality was good. Central Foveal Thickness: 341. Progression has been stable. Findings include abnormal foveal contour, intraretinal fluid, no SRF, vitreomacular adhesion  (Pre-retinal fibrosis overlying disc).   Notes *Images captured and stored on drive  Diagnosis / Impression:  OD: clearing VH, +DME, mild ERM OS: abnormal foveal contour; +IRF; preretinal fibrosis about the disc; +DME  Clinical management:  See below  Abbreviations: NFP - Normal foveal profile. CME - cystoid macular edema. PED - pigment epithelial detachment. IRF - intraretinal fluid. SRF - subretinal fluid. EZ - ellipsoid zone. ERM - epiretinal membrane. ORA - outer retinal atrophy. ORT - outer retinal tubulation. SRHM - subretinal hyper-reflective material         Intravitreal Injection, Pharmacologic Agent - OS - Left Eye       Time Out 11/23/2017. 12:50 PM. Confirmed correct patient, procedure, site, and patient  consented.   Anesthesia Topical anesthesia was used. Anesthetic medications included Lidocaine 2%, Proparacaine 0.5%.   Procedure Preparation included 5% betadine to ocular surface, eyelid speculum. A 30 gauge needle was used.   Injection:  1.25 mg Bevacizumab 1.25mg /0.71ml   NDC: 32202-542-70, Lot: [id number] , Expiration date: 12/29/2017   Route: Intravitreal, Site: Left Eye, Waste: 0 mL  Post-op Post injection exam found visual acuity of at least counting fingers. The patient tolerated the procedure well. There were no complications. The patient received written and verbal post procedure care education.        Panretinal Photocoagulation - OD - Right Eye       LASER PROCEDURE NOTE  Diagnosis:   Proliferative Diabetic Retinopathy, RIGHT EYE  Procedure:  Pan-retinal photocoagulation using slit lamp laser, RIGHT EYE  Anesthesia:  Topical  Surgeon: 24/03/2017, MD, PhD   Informed consent obtained, operative eye marked, and time out performed prior to initiation of laser.   Lumenis Bernarda Caffey slit lamp laser Pattern: 3x3 square Power: 320 mW Duration: 30 msec  Spot size: 200 microns  # spots: [id number] spots  Complications: None.  Notes: significant vitreous heme obscuring view and preventing laser up take inferiorly and scattered focal areas  RTC: 2-3 wks   Patient tolerated the procedure well and received written and verbal post-procedure care information/education.                  ASSESSMENT/PLAN:    ICD-10-CM   1. Vitreous hemorrhage of right eye (HCC) H43.11 OCT, Retina - OU - Both Eyes    Panretinal Photocoagulation - OD - Right Eye  2. Proliferative diabetic retinopathy of both eyes with macular edema associated with type 2 diabetes mellitus (HCC) 0539 Intravitreal Injection, Pharmacologic Agent - OS - Left Eye    Panretinal Photocoagulation - OD - Right Eye    Bevacizumab (AVASTIN) SOLN 1.25 mg  3. Retinal edema H35.81 OCT, Retina - OU - Both  Eyes  4. Hypertensive retinopathy of both eyes H35.033   5. Essential hypertension  I10   6. Combined forms of age-related cataract of both eyes H25.813     1. Vitreous Hemorrhage OD- improving - secondary to PDR as described below - discussed findings, prognosis and treatment options - S/P IVA OD #1 (09.20.19) - VH improved with superior view clearing, heme settling inferiorly - recommend PRP OD today (09.27.19) - pt wishes to proceed with PRP OD - RBA of procedure discussed, questions answered - informed consent obtained and signed - see procedure note -- mostly superior laser due to inf VH - start PF OD QID x 7 days - VH precautions reviewed -- minimize activities, keep head elevated, avoid ASA/NSAIDs/blood thinners as able - f/u 2-3 weeks  2,3. Proliferative diabetic retinopathy w/ DME, OU - exam shows OD with improving VH and peripheral NV and cap nonperfusion  OS -- +NVD, +NVE, scattered MA and exudates and +DME - FA (9.20.19) shows +NVE OU and leaking MA and capillary nonperfusion - OCT confirms diabetic macular edema OS  - S/P PRP OS (09.20.19) - discussed findings and prognosis - recommend IVA OS #1 today (09.27.19)  - pt wishes to proceed with IVA OS - RBA of procedure discussed, questions answered - informed consent obtained and signed - see procedure note - F/U 2-3 weeks  4,5. Hypertensive retinopathy OU - discussed importance of tight BP control - monitor  6. Combined form age related cataract OU-  - The symptoms of cataract, surgical options, and treatments and risks were discussed with patient. - discussed diagnosis and progression - not yet visually significant - monitor for now   Ophthalmic Meds Ordered this visit:  Meds ordered this encounter  Medications  . Bevacizumab (AVASTIN) SOLN 1.25 mg       Return in about 2 weeks (around 12/07/2017) for F/U VH OD; PDR OU, DFE, OCT.  There are no Patient Instructions on file for this  visit.   Explained the diagnoses, plan, and follow up with the patient and they expressed understanding.  Patient expressed understanding of the importance of proper follow up care.   This document serves as a record of services personally performed by Gardiner Sleeper, MD, PhD. It was created on their behalf by Catha Brow, [place], a certified ophthalmic assistant. The creation of this record is the provider's dictation and/or activities during the visit.  Electronically signed by: Catha Brow, COA  09.26.19 10:58 AM   Gardiner Sleeper, M.D., Ph.D. Diseases & Surgery of the Retina and Vitreous [facility]  I have reviewed the above documentation for accuracy and completeness, and I agree with the above. Gardiner Sleeper, M.D., Ph.D. 11/26/17 10:58 AM   Abbreviations: M myopia (nearsighted); A astigmatism; H hyperopia (farsighted); P presbyopia; Mrx spectacle prescription;  CTL contact lenses; OD right eye; OS left eye; OU both eyes  XT exotropia; ET esotropia; PEK punctate epithelial keratitis; PEE punctate epithelial erosions; DES dry eye syndrome; MGD meibomian gland dysfunction; ATs artificial tears; PFAT's preservative free artificial tears; [place] nuclear sclerotic cataract; PSC posterior subcapsular cataract; ERM epi-retinal membrane; PVD posterior vitreous detachment; RD retinal detachment; DM diabetes mellitus; DR diabetic retinopathy; NPDR non-proliferative diabetic retinopathy; PDR proliferative diabetic retinopathy; CSME clinically significant macular edema; DME diabetic macular edema; dbh dot blot hemorrhages; CWS cotton wool spot; POAG primary open angle glaucoma; C/D cup-to-disc ratio; HVF humphrey visual field; GVF goldmann visual field; OCT optical coherence tomography; IOP intraocular pressure; BRVO Branch retinal vein occlusion; CRVO central retinal vein occlusion; CRAO central retinal artery occlusion; BRAO  branch retinal artery occlusion; RT retinal tear;  SB scleral buckle; PPV pars plana vitrectomy; VH Vitreous hemorrhage; PRP panretinal laser photocoagulation; IVK intravitreal kenalog; VMT vitreomacular traction; MH Macular hole;  NVD neovascularization of the disc; NVE neovascularization elsewhere; AREDS age related eye disease study; ARMD age related macular degeneration; POAG primary open angle glaucoma; EBMD epithelial/anterior basement membrane dystrophy; ACIOL anterior chamber intraocular lens; IOL intraocular lens; PCIOL posterior chamber intraocular lens; Phaco/IOL phacoemulsification with intraocular lens placement; [place] photorefractive keratectomy; LASIK laser assisted in situ keratomileusis; HTN hypertension; DM diabetes mellitus; COPD chronic obstructive pulmonary disease

## 2017-11-23 ENCOUNTER — Encounter (INDEPENDENT_AMBULATORY_CARE_PROVIDER_SITE_OTHER): Payer: BLUE CROSS/BLUE SHIELD | Admitting: Ophthalmology

## 2017-11-23 ENCOUNTER — Ambulatory Visit (INDEPENDENT_AMBULATORY_CARE_PROVIDER_SITE_OTHER): Payer: BLUE CROSS/BLUE SHIELD | Admitting: Ophthalmology

## 2017-11-23 DIAGNOSIS — H25813 Combined forms of age-related cataract, bilateral: Secondary | ICD-10-CM

## 2017-11-23 DIAGNOSIS — E113513 Type 2 diabetes mellitus with proliferative diabetic retinopathy with macular edema, bilateral: Secondary | ICD-10-CM

## 2017-11-23 DIAGNOSIS — H3581 Retinal edema: Secondary | ICD-10-CM | POA: Diagnosis not present

## 2017-11-23 DIAGNOSIS — H35033 Hypertensive retinopathy, bilateral: Secondary | ICD-10-CM

## 2017-11-23 DIAGNOSIS — I10 Essential (primary) hypertension: Secondary | ICD-10-CM

## 2017-11-23 DIAGNOSIS — H4311 Vitreous hemorrhage, right eye: Secondary | ICD-10-CM | POA: Diagnosis not present

## 2017-11-23 MED ORDER — BEVACIZUMAB CHEMO INJECTION 1.25MG/0.05ML SYRINGE FOR KALEIDOSCOPE
1.2500 mg | INTRAVITREAL | Status: DC
  Administered 2017-11-23: 1.25 mg via INTRAVITREAL

## 2017-11-25 ENCOUNTER — Other Ambulatory Visit: Payer: Self-pay | Admitting: Family Medicine

## 2017-11-26 ENCOUNTER — Encounter (INDEPENDENT_AMBULATORY_CARE_PROVIDER_SITE_OTHER): Payer: Self-pay | Admitting: Ophthalmology

## 2017-11-28 ENCOUNTER — Ambulatory Visit: Payer: BLUE CROSS/BLUE SHIELD | Admitting: Family Medicine

## 2017-11-28 ENCOUNTER — Encounter: Payer: Self-pay | Admitting: Family Medicine

## 2017-11-28 VITALS — BP 125/84 | HR 104 | Temp 98.2°F | Resp 16 | Ht 64.0 in | Wt 150.4 lb

## 2017-11-28 DIAGNOSIS — E663 Overweight: Secondary | ICD-10-CM

## 2017-11-28 DIAGNOSIS — E782 Mixed hyperlipidemia: Secondary | ICD-10-CM | POA: Diagnosis not present

## 2017-11-28 DIAGNOSIS — E118 Type 2 diabetes mellitus with unspecified complications: Secondary | ICD-10-CM | POA: Diagnosis not present

## 2017-11-28 DIAGNOSIS — K3184 Gastroparesis: Secondary | ICD-10-CM

## 2017-11-28 DIAGNOSIS — Z23 Encounter for immunization: Secondary | ICD-10-CM | POA: Diagnosis not present

## 2017-11-28 DIAGNOSIS — E1121 Type 2 diabetes mellitus with diabetic nephropathy: Secondary | ICD-10-CM | POA: Diagnosis not present

## 2017-11-28 DIAGNOSIS — E1143 Type 2 diabetes mellitus with diabetic autonomic (poly)neuropathy: Secondary | ICD-10-CM

## 2017-11-28 LAB — HEMOGLOBIN A1C: HEMOGLOBIN A1C: 7.7 % — AB (ref 4.6–6.5)

## 2017-11-28 LAB — BASIC METABOLIC PANEL
BUN: 14 mg/dL (ref 6–23)
CHLORIDE: 104 meq/L (ref 96–112)
CO2: 23 meq/L (ref 19–32)
Calcium: 10 mg/dL (ref 8.4–10.5)
Creatinine, Ser: 0.54 mg/dL (ref 0.40–1.20)
GFR: 127.52 mL/min (ref >60.00)
GLUCOSE: 136 mg/dL — AB (ref 70–99)
POTASSIUM: 4.5 meq/L (ref 3.5–5.1)
SODIUM: 140 meq/L (ref 135–145)

## 2017-11-28 MED ORDER — LOSARTAN POTASSIUM 50 MG PO TABS: 50.0000 mg | ORAL_TABLET | Freq: Every day | ORAL | 3 refills | Status: DC

## 2017-11-28 NOTE — Progress Notes (Signed)
OFFICE VISIT  11/28/2017   CC:  Chief Complaint  Patient presents with  . Follow-up    RCI, pt is fasting.    HPI:    Patient is a 47 y.o. Caucasian female who presents for 3 mo f/u DM 2. A1c was up last f/u so I increased her invokana to300 qd. Also, she had proteinuria so I started lisinopril 5mg  qd.  She says the cough caused a persistent bothersome dry cough.  She stopped lisinopril and the cough resolved.  DM 2: glucoses much better the last 1 mo: 90-120s mostly, occ around 180s. The month prior she did not have very good glucoses, was not eating as well, more swelling in legs and was traveling a lot.    She saw GI, Dr. Havery Moros, on 11/07/17 and they felt like she does have diab gastroparesis AND they wanted to do an EGD on her.  This will be in about 2 wks.  She did go off her statin to see if this was causing her body pains.  Initially there was no improvement but in the last month it has improved some.  ROS: no CP, no SOB, no wheezing, no cough, no dizziness, no HAs, no rashes, no melena/hematochezia.  No polyuria or polydipsia.  No myalgias or arthralgias.   Past Medical History:  Diagnosis Date  . Abdominal bloating    likely from diab gastroparesis.  Dr. Havery Moros to do EGD as of 10/2017 GI eval.  . Benign brain tumor ([place])    Cystic lesion in cerebral aqueduct region with mild hydrocephalus-- stable MRI 02/2016.  Surveillance MRI 05/2017 --dilated cerebral aqueduct related to aqueductal stenosis and subsequent mild hydrocephalus (due to the 11 mm stable cystic lesion in cerebral aqueduct---?congenitial?.  . Gluten intolerance    pt reports she underwent full GI w/u to r/o celiac dz  . Hepatic steatosis    ultrasound 08/2017  . Hyperlipidemia, mixed   . Hypertensive retinopathy of both eyes   . Proliferative diabetic retinopathy of both eyes ([place])    steroid injections 10/2017  . Sensorineural hearing loss of left ear    Sudden left hearing loss summer 2016--no  improvement with steroids 01/2015 so brain MRI done by Dr. Redmond Baseman and it showed brain tumor that was determined to be benign.  Pt's hearing not bad enough for hearing aid as of 06/2016.  . Type 2 diabetes with complication ([place])    diab retpthy, diabetic gastroparesis (gastric emptying study mildly abnl 03/2017)  . White coat hypertension     Past Surgical History:  Procedure Laterality Date  . CHOLECYSTECTOMY  2000  . GASTRIC EMPTYING SCAN  04/20/2017   Mildly abnormal, particularly the 1st hour of emptying.    Outpatient Medications Prior to Visit  Medication Sig Dispense Refill  . canagliflozin (INVOKANA) 300 MG TABS tablet Take 1 tablet (300 mg total) by mouth daily before breakfast. 30 tablet 3  . glipiZIDE (GLUCOTROL XL) 10 MG 24 hr tablet TAKE 1 TABLET BY MOUTH WITH BREAKFAST 90 tablet 0  . HYDROcodone-acetaminophen (NORCO/VICODIN) 5-325 MG tablet Take 1-2 tablets by mouth every 6 (six) hours as needed for moderate pain. 40 tablet 0  . metFORMIN (GLUCOPHAGE-XR) 500 MG 24 hr tablet TAKE 2 TABLETS BY MOUTH TWICE DAILY 360 tablet 0  . pantoprazole (PROTONIX) 40 MG tablet TAKE 1 TABLET BY MOUTH ONCE DAILY 90 tablet 1  . promethazine (PHENERGAN) 12.5 MG tablet 1-2 tabs po q6h prn nausea 30 tablet 1  . lisinopril (PRINIVIL,ZESTRIL) 5  MG tablet Take 1 tablet (5 mg total) by mouth daily. (Patient not taking: Reported on 11/28/2017) 30 tablet 3   Facility-Administered Medications Prior to Visit  Medication Dose Route Frequency Provider Last Rate Last Dose  . Bevacizumab (AVASTIN) SOLN 1.25 mg  1.25 mg Intravitreal  Bernarda Caffey, MD   1.25 mg at 11/18/17 0000  . Bevacizumab (AVASTIN) SOLN 1.25 mg  1.25 mg Intravitreal  Bernarda Caffey, MD   1.25 mg at 11/23/17 1250    Allergies  Allergen Reactions  . Gluten Meal Swelling  . Lisinopril Cough  . Pioglitazone Other (See Comments)    ELEVATED glucoses + worse chronic nausea    ROS As per HPI  PE: Blood pressure 125/84, pulse (!) 104,  temperature 98.2 F (36.8 C), temperature source Oral, resp. rate 16, height 5\' 4"  (1.626 m), weight 150 lb 6 oz (68.2 kg), last menstrual period 11/19/2017, SpO2 99 %. Gen: Alert, well appearing.  Patient is oriented to person, place, time, and situation. AFFECT: pleasant, lucid thought and speech. No further exam today.  LABS:  Lab Results  Component Value Date   TSH 1.65 08/22/2017   Lab Results  Component Value Date   WBC 8.2 03/15/2017   HGB 12.7 03/15/2017   HCT 39.1 03/15/2017   MCV 85.3 03/15/2017   PLT 428.0 (H) 03/15/2017   Lab Results  Component Value Date   CREATININE 0.61 08/22/2017   BUN 21 08/22/2017   NA 137 08/22/2017   K 4.9 08/22/2017   CL 102 08/22/2017   CO2 24 08/22/2017   Lab Results  Component Value Date   ALT 33 03/15/2017   AST 21 03/15/2017   ALKPHOS 70 03/15/2017   BILITOT 0.2 03/15/2017   Lab Results  Component Value Date   CHOL 151 07/19/2016   Lab Results  Component Value Date   HDL 34.50 (L) 07/19/2016   No results found for: [facility] Lab Results  Component Value Date   TRIG 276.0 (H) 07/19/2016   Lab Results  Component Value Date   CHOLHDL 4 07/19/2016   Lab Results  Component Value Date   HGBA1C 10.3 (H) 08/22/2017    IMPRESSION AND PLAN:  1) DM 2; control not too good at all, but seems to have turned the corner some the last month or so. She has proteinuria, retinopathy, and gastroparesis. Continue full dose invokana, metformin, and glipizide xl 10mg  qAM.  2) Proteinuria: she got ACE-I cough. Change to trial of losartan 50mg  qd.  3) Hyperlipidemia, mixed: intolerant of atorvastatin. Remain off statin for now. We'll take up this problem again in the future when we are making good progress/improvement in her diabetes.  4) GI sx's: certainly has diab gastroparesis, but some sx's of gastritis as well. GI to do EGD soon.  An After Visit Summary was printed and given to the patient.  FOLLOW UP: 3 mo f/u  RCI  Signed:  Crissie Sickles, MD           11/28/2017

## 2017-11-29 ENCOUNTER — Telehealth: Payer: Self-pay | Admitting: Family Medicine

## 2017-11-29 NOTE — Telephone Encounter (Signed)
Patient forgot to ask yesterday, but patient would like to know if you can RX something for her insomnia.  She states she's done melatonin and Tylenol PM without success.  Patient states she would only use PRN but would like to see if there is anything that would help her?  Please advise.     She states that if you would to address this at her next OV that is fine she just wanted to see what you could offer.  Thanks.

## 2017-11-30 ENCOUNTER — Other Ambulatory Visit: Payer: Self-pay | Admitting: Family Medicine

## 2017-11-30 MED ORDER — TEMAZEPAM 15 MG PO CAPS: 15.0000 mg | ORAL_CAPSULE | Freq: Every evening | ORAL | 1 refills | Status: DC | PRN

## 2017-11-30 NOTE — Telephone Encounter (Signed)
I will eRx restoril.

## 2017-11-30 NOTE — Telephone Encounter (Signed)
Left message for pt to call back.   Okay to [place] to advise pt.

## 2017-12-04 ENCOUNTER — Encounter: Payer: Self-pay | Admitting: Gastroenterology

## 2017-12-04 NOTE — Telephone Encounter (Signed)
Left message for pt to call back.   Okay for [facility] to advise pt, please let me know if pt calls back.

## 2017-12-05 ENCOUNTER — Ambulatory Visit (INDEPENDENT_AMBULATORY_CARE_PROVIDER_SITE_OTHER): Payer: BLUE CROSS/BLUE SHIELD | Admitting: Ophthalmology

## 2017-12-05 DIAGNOSIS — E113513 Type 2 diabetes mellitus with proliferative diabetic retinopathy with macular edema, bilateral: Secondary | ICD-10-CM | POA: Diagnosis not present

## 2017-12-05 DIAGNOSIS — H35033 Hypertensive retinopathy, bilateral: Secondary | ICD-10-CM

## 2017-12-05 DIAGNOSIS — I10 Essential (primary) hypertension: Secondary | ICD-10-CM

## 2017-12-05 DIAGNOSIS — H3581 Retinal edema: Secondary | ICD-10-CM

## 2017-12-05 DIAGNOSIS — H4311 Vitreous hemorrhage, right eye: Secondary | ICD-10-CM

## 2017-12-05 DIAGNOSIS — H25813 Combined forms of age-related cataract, bilateral: Secondary | ICD-10-CM

## 2017-12-05 NOTE — Progress Notes (Signed)
[facility] Note  12/05/2017     CHIEF COMPLAINT Patient presents for Retina Follow Up   HISTORY OF PRESENT ILLNESS: Gloria Gomez is a 47 y.o. female who presents to the clinic today for:   HPI    Retina Follow Up    Patient presents with  Other.  In right eye.  This started 2 weeks ago.  Severity is mild.  Since onset it is stable.  I, the attending physician,  performed the HPI with the patient and updated documentation appropriately.          Comments    F/U VH OD. Patient states her 'vision is cloudy" OD and she" noticed lines" in her vision OD. Os is "great". Patient is ready for tx today if indicated       Last edited by Bernarda Caffey, MD on 12/09/2017  9:24 PM. (History)    Pt states she was seen by PCP last week and reports A1C was 7.7 from 10;   Referring physician: Tammi Sou, MD [address] [place], [place] [address]  HISTORICAL INFORMATION:   Selected notes from the MEDICAL RECORD NUMBER Referred for DM exam LEE:  Ocular Hx-NPDR PMH-DM (takes invokana and metformin), high cholesterol, benign brain tumor, white coat HTN    CURRENT MEDICATIONS: No current outpatient medications on file. (Ophthalmic Drugs)   No current facility-administered medications for this visit.  (Ophthalmic Drugs)   Current Outpatient Medications (Other)  Medication Sig  . canagliflozin (INVOKANA) 300 MG TABS tablet Take 1 tablet (300 mg total) by mouth daily before breakfast.  . glipiZIDE (GLUCOTROL XL) 10 MG 24 hr tablet TAKE 1 TABLET BY MOUTH WITH BREAKFAST  . HYDROcodone-acetaminophen (NORCO/VICODIN) 5-325 MG tablet Take 1-2 tablets by mouth every 6 (six) hours as needed for moderate pain.  Marland Kitchen losartan (COZAAR) 50 MG tablet Take 1 tablet (50 mg total) by mouth daily.  . metFORMIN (GLUCOPHAGE-XR) 500 MG 24 hr tablet TAKE 2 TABLETS BY MOUTH TWICE DAILY  . pantoprazole (PROTONIX) 40 MG tablet TAKE 1 TABLET BY MOUTH ONCE DAILY  .  promethazine (PHENERGAN) 12.5 MG tablet 1-2 tabs po q6h prn nausea  . temazepam (RESTORIL) 15 MG capsule Take 1 capsule (15 mg total) by mouth at bedtime as needed for sleep.   Current Facility-Administered Medications (Other)  Medication Route  . Bevacizumab (AVASTIN) SOLN 1.25 mg Intravitreal  . Bevacizumab (AVASTIN) SOLN 1.25 mg Intravitreal      REVIEW OF SYSTEMS: ROS    Positive for: Eyes   Negative for: Constitutional, Gastrointestinal, Neurological, Skin, Genitourinary, Musculoskeletal, HENT, Endocrine, Cardiovascular, Respiratory, Psychiatric, Allergic/Imm, Heme/Lymph   Last edited by Zenovia Jordan, LPN on 81/0/1751  0:25 AM. (History)       ALLERGIES Allergies  Allergen Reactions  . Gluten Meal Swelling  . Lisinopril Cough  . Pioglitazone Other (See Comments)    ELEVATED glucoses + worse chronic nausea    PAST MEDICAL HISTORY Past Medical History:  Diagnosis Date  . Abdominal bloating    likely from diab gastroparesis.  Dr. Havery Moros to do EGD as of 10/2017 GI eval.  . Benign brain tumor ([place])    Cystic lesion in cerebral aqueduct region with mild hydrocephalus-- stable MRI 02/2016.  Surveillance MRI 05/2017 --dilated cerebral aqueduct related to aqueductal stenosis and subsequent mild hydrocephalus (due to the 11 mm stable cystic lesion in cerebral aqueduct---?congenitial?.  . Gluten intolerance    pt reports she underwent full GI w/u to r/o  celiac dz  . Hepatic steatosis    ultrasound 08/2017  . Hyperlipidemia, mixed   . Hypertensive retinopathy of both eyes   . Proliferative diabetic retinopathy of both eyes ([place])    steroid injections 10/2017  . Sensorineural hearing loss of left ear    Sudden left hearing loss summer 2016--no improvement with steroids 01/2015 so brain MRI done by Dr. Redmond Baseman and it showed brain tumor that was determined to be benign.  Pt's hearing not bad enough for hearing aid as of 06/2016.  . Type 2 diabetes with complication ([place])    diab  retpthy, diabetic gastroparesis (gastric emptying study mildly abnl 03/2017)  . White coat hypertension    Past Surgical History:  Procedure Laterality Date  . CHOLECYSTECTOMY  2000  . GASTRIC EMPTYING SCAN  04/20/2017   Mildly abnormal, particularly the 1st hour of emptying.    FAMILY HISTORY Family History  Problem Relation Age of Onset  . Brain cancer Mother   . Diabetes Father   . Diabetes Maternal Grandmother   . Cataracts Maternal Grandmother   . Cervical cancer Paternal Grandmother   . Colon cancer Maternal Grandfather 36  . Amblyopia Neg Hx   . Blindness Neg Hx   . Glaucoma Neg Hx   . Macular degeneration Neg Hx   . Retinal detachment Neg Hx   . Strabismus Neg Hx   . Retinitis pigmentosa Neg Hx     SOCIAL HISTORY Social History   Tobacco Use  . Smoking status: Never Smoker  . Smokeless tobacco: Never Used  Substance Use Topics  . Alcohol use: No  . Drug use: No         OPHTHALMIC EXAM:  Base Eye Exam    Visual Acuity (Snellen - Linear)      Right Left   Dist cc 20/50 -1 20/30 -1   Dist ph cc 20/40 20/25 -2       Tonometry (Tonopen, 9:14 AM)      Right Left   Pressure 14 14       Pupils      Dark Light Shape React APD   Right 4 3 Round Brisk None   Left 4 3 Round Brisk None       Visual Fields (Counting fingers)      Left Right    Full Full       Extraocular Movement      Right Left    Full, Ortho Full, Ortho       Neuro/Psych    Oriented x3:  Yes   Mood/Affect:  Normal       Dilation    Both eyes:  1.0% Mydriacyl, 2.5% Phenylephrine @ 9:09 AM        Slit Lamp and Fundus Exam    Slit Lamp Exam      Right Left   Lids/Lashes Dermatochalasis - upper lid, Meibomian gland dysfunction, Telangiectasia Dermatochalasis - upper lid, Meibomian gland dysfunction, Telangiectasia   Conjunctiva/Sclera White and quiet White and quiet   Cornea 1+ Punctate epithelial erosions Clear   Anterior Chamber Deep and quiet Deep and quiet   Iris  Round and dilated, No NVI Round and dilated, No NVI   Lens 2+ Nuclear sclerosis, 2+ Cortical cataract 2+ Nuclear sclerosis, 2+ Cortical cataract   Vitreous Vitreous syneresis, +diffuse VH -- clearing and settling inferiorly, VH clumping inferiorly, inferior blood clots, 3-4+ RBC in anterior vitreous Vitreous syneresis       Fundus Exam  Right Left   Disc Mildly hazy view, mildly blurred margin, ?Regressing fine NVD +fine NVD temporal quadrant, sharp and pink rim   C/D Ratio 0.2 0.1   Macula Hazy view, Blunted foveal reflex, scattered Microaneurysms, +edema, small area of exudates superior macula, cluster of IRH and exudates temporal macula Blunted foveal reflex, cluster Microaneurysms and Exudates temporal to fovea, +edema, scattered IRH   Vessels Hazy view, NVE, venous beading peripherally -- improved, Vascular attenuation Mild venous beading -- improved   Periphery Hazy view from [facility], grossly attached, scattered DBH 360, good superior PRP laser changes, scattered 360 IRH peripherally Attached, +NVE IN arcades, scattered DBH, scattered IRH, good 360 PRP laser changes, room for fill-in if needed, scattered DBH          IMAGING AND PROCEDURES  Imaging and Procedures for @TODAY @  OCT, Retina - OU - Both Eyes       Right Eye Quality was good. Central Foveal Thickness: 321. Progression has been stable. Findings include abnormal foveal contour, intraretinal fluid, no SRF, epiretinal membrane (Interval improvement in Vitreous opacities).   Left Eye Quality was good. Central Foveal Thickness: 317. Progression has been stable. Findings include abnormal foveal contour, intraretinal fluid, no SRF, vitreomacular adhesion  (Pre-retinal fibrosis overlying disc).   Notes *Images captured and stored on drive  Diagnosis / Impression:  OD: clearing VH, stable DME, mild ERM OS: abnormal foveal contour; +IRF; preretinal fibrosis about the disc; +DME  Clinical management:  See  below  Abbreviations: NFP - Normal foveal profile. CME - cystoid macular edema. PED - pigment epithelial detachment. IRF - intraretinal fluid. SRF - subretinal fluid. EZ - ellipsoid zone. ERM - epiretinal membrane. ORA - outer retinal atrophy. ORT - outer retinal tubulation. SRHM - subretinal hyper-reflective material                  ASSESSMENT/PLAN:    ICD-10-CM   1. Vitreous hemorrhage of right eye (HCC) H43.11 OCT, Retina - OU - Both Eyes  2. Proliferative diabetic retinopathy of both eyes with macular edema associated with type 2 diabetes mellitus ([place]) P29.5188   3. Retinal edema H35.81 OCT, Retina - OU - Both Eyes  4. Hypertensive retinopathy of both eyes H35.033   5. Essential hypertension I10   6. Combined forms of age-related cataract of both eyes H25.813     1. Vitreous Hemorrhage OD- improving - secondary to PDR as described below - discussed findings, prognosis and treatment options - S/P IVA OD #1 (09.20.19) - S/P PRP OD #1 (09.27.19) -- limited by [facility]; mostly superior PRP - VH improved with superior view clearing, heme settling inferiorly - finished PF OD QID x 7 days - VH precautions reviewed -- minimize activities, keep head elevated, avoid ASA/NSAIDs/blood thinners as able - f/u 2 weeks -- need to fill in PRP OD once heme clears a little more; can repeat IVA OU 10.25.19  2,3. Proliferative diabetic retinopathy w/ DME, OU - exam shows OD with improving VH and peripheral NV and cap nonperfusion  OS -- +NVD, +NVE, scattered MA and exudates and +DME - FA (9.20.19) shows +NVE OU and leaking MA and capillary nonperfusion - OCT confirms diabetic macular edema OS  - S/P PRP OS (09.20.19) - S/P IVA OS (09.27.19) - stable for now with regressing NVD - HbA1c 7.7% (10.2.19) from 10.3% 3 mos ago (6.26.19) - F/U 10.25.19 -- likely IVA OU +/- PRP fill in OD  4,5. Hypertensive retinopathy OU - discussed importance of tight BP control -  monitor  6. Combined form age  related cataract OU-  - The symptoms of cataract, surgical options, and treatments and risks were discussed with patient. - discussed diagnosis and progression - not yet visually significant - monitor for now   Ophthalmic Meds Ordered this visit:  No orders of the defined types were placed in this encounter.      Return in about 2 weeks (around 12/19/2017) for F/U VH OD, PDR OU, DFE, OCT.  There are no Patient Instructions on file for this visit.   Explained the diagnoses, plan, and follow up with the patient and they expressed understanding.  Patient expressed understanding of the importance of proper follow up care.   This document serves as a record of services personally performed by Gardiner Sleeper, MD, PhD. It was created on their behalf by Catha Brow, [place], a certified ophthalmic assistant. The creation of this record is the provider's dictation and/or activities during the visit.  Electronically signed by: Catha Brow, COA  10.09.19 9:30 PM   Gardiner Sleeper, M.D., Ph.D. Diseases & Surgery of the Retina and Vitreous [facility]  I have reviewed the above documentation for accuracy and completeness, and I agree with the above. Gardiner Sleeper, M.D., Ph.D. 12/09/17 9:30 PM    Abbreviations: M myopia (nearsighted); A astigmatism; H hyperopia (farsighted); P presbyopia; Mrx spectacle prescription;  CTL contact lenses; OD right eye; OS left eye; OU both eyes  XT exotropia; ET esotropia; PEK punctate epithelial keratitis; PEE punctate epithelial erosions; DES dry eye syndrome; MGD meibomian gland dysfunction; ATs artificial tears; PFAT's preservative free artificial tears; [place] nuclear sclerotic cataract; PSC posterior subcapsular cataract; ERM epi-retinal membrane; PVD posterior vitreous detachment; RD retinal detachment; DM diabetes mellitus; DR diabetic retinopathy; NPDR non-proliferative diabetic retinopathy; PDR proliferative diabetic  retinopathy; CSME clinically significant macular edema; DME diabetic macular edema; dbh dot blot hemorrhages; CWS cotton wool spot; POAG primary open angle glaucoma; C/D cup-to-disc ratio; HVF humphrey visual field; GVF goldmann visual field; OCT optical coherence tomography; IOP intraocular pressure; BRVO Branch retinal vein occlusion; CRVO central retinal vein occlusion; CRAO central retinal artery occlusion; BRAO branch retinal artery occlusion; RT retinal tear; SB scleral buckle; PPV pars plana vitrectomy; VH Vitreous hemorrhage; PRP panretinal laser photocoagulation; IVK intravitreal kenalog; VMT vitreomacular traction; MH Macular hole;  NVD neovascularization of the disc; NVE neovascularization elsewhere; AREDS age related eye disease study; ARMD age related macular degeneration; POAG primary open angle glaucoma; EBMD epithelial/anterior basement membrane dystrophy; ACIOL anterior chamber intraocular lens; IOL intraocular lens; PCIOL posterior chamber intraocular lens; Phaco/IOL phacoemulsification with intraocular lens placement; [place] photorefractive keratectomy; LASIK laser assisted in situ keratomileusis; HTN hypertension; DM diabetes mellitus; COPD chronic obstructive pulmonary disease

## 2017-12-07 ENCOUNTER — Encounter: Payer: Self-pay | Admitting: *Deleted

## 2017-12-07 NOTE — Telephone Encounter (Signed)
SW [place] at [place] and she stated that pt p/u Rx on 11/30/17.

## 2017-12-09 ENCOUNTER — Encounter (INDEPENDENT_AMBULATORY_CARE_PROVIDER_SITE_OTHER): Payer: Self-pay | Admitting: Ophthalmology

## 2017-12-13 ENCOUNTER — Telehealth: Payer: Self-pay | Admitting: Gastroenterology

## 2017-12-13 NOTE — Telephone Encounter (Signed)
Hi Dr. Havery Moros, this pt just cancelled her egd scheduled on 12/17/17 because she injured her elbow and has to wear a cast for a while, she will call back to reschedule. Thank you

## 2017-12-13 NOTE — Telephone Encounter (Signed)
Okay thanks for letting me know

## 2017-12-16 ENCOUNTER — Other Ambulatory Visit: Payer: Self-pay | Admitting: Family Medicine

## 2017-12-17 ENCOUNTER — Encounter: Payer: BLUE CROSS/BLUE SHIELD | Admitting: Gastroenterology

## 2017-12-20 NOTE — Progress Notes (Addendum)
[facility] Note  12/21/2017     CHIEF COMPLAINT Patient presents for Retina Follow Up   HISTORY OF PRESENT ILLNESS: Gloria Gomez is a 47 y.o. female who presents to the clinic today for:   HPI    Retina Follow Up    Patient presents with  Other.  In right eye.  This started 2 months ago.  Severity is mild.  Since onset it is gradually improving.  I, the attending physician,  performed the HPI with the patient and updated documentation appropriately.          Comments    F/U Vit. Hem. OD PDR OU. S/P Avastin OD 11/18/17/ OS 11/23/17.Pt states her vision has improved, Denies new visual onsets. BS" lower than 140 all week". Pt ready for tx today.        Last edited by Bernarda Caffey, MD on 12/21/2017 11:39 AM. (History)      Referring physician: Tammi Sou, MD [address] [place], [place] [address]  HISTORICAL INFORMATION:   Selected notes from the MEDICAL RECORD NUMBER Referred for DM exam LEE:  Ocular Hx-NPDR PMH-DM (takes invokana and metformin), high cholesterol, benign brain tumor, white coat HTN    CURRENT MEDICATIONS: No current outpatient medications on file. (Ophthalmic Drugs)   No current facility-administered medications for this visit.  (Ophthalmic Drugs)   Current Outpatient Medications (Other)  Medication Sig  . canagliflozin (INVOKANA) 300 MG TABS tablet Take 1 tablet (300 mg total) by mouth daily before breakfast.  . glipiZIDE (GLUCOTROL XL) 10 MG 24 hr tablet TAKE 1 TABLET BY MOUTH WITH BREAKFAST  . HYDROcodone-acetaminophen (NORCO/VICODIN) 5-325 MG tablet Take 1-2 tablets by mouth every 6 (six) hours as needed for moderate pain.  Marland Kitchen losartan (COZAAR) 50 MG tablet Take 1 tablet (50 mg total) by mouth daily.  . metFORMIN (GLUCOPHAGE-XR) 500 MG 24 hr tablet TAKE 2 TABLETS BY MOUTH TWICE DAILY  . pantoprazole (PROTONIX) 40 MG tablet TAKE 1 TABLET BY MOUTH ONCE DAILY  . promethazine (PHENERGAN) 12.5 MG tablet 1-2  tabs po q6h prn nausea  . temazepam (RESTORIL) 15 MG capsule Take 1 capsule (15 mg total) by mouth at bedtime as needed for sleep.   Current Facility-Administered Medications (Other)  Medication Route  . Bevacizumab (AVASTIN) SOLN 1.25 mg Intravitreal  . Bevacizumab (AVASTIN) SOLN 1.25 mg Intravitreal  . Bevacizumab (AVASTIN) SOLN 1.25 mg Intravitreal  . Bevacizumab (AVASTIN) SOLN 1.25 mg Intravitreal      REVIEW OF SYSTEMS: ROS    Positive for: Eyes   Negative for: Constitutional, Gastrointestinal, Neurological, Skin, Genitourinary, Musculoskeletal, HENT, Endocrine, Cardiovascular, Respiratory, Psychiatric, Allergic/Imm, Heme/Lymph   Last edited by Zenovia Jordan, LPN on 90/24/0973 53:29 AM. (History)       ALLERGIES Allergies  Allergen Reactions  . Gluten Meal Swelling  . Lisinopril Cough  . Pioglitazone Other (See Comments)    ELEVATED glucoses + worse chronic nausea    PAST MEDICAL HISTORY Past Medical History:  Diagnosis Date  . Abdominal bloating    likely from diab gastroparesis.  Dr. Havery Moros to do EGD as of 10/2017 GI eval.  . Benign brain tumor ([place])    Cystic lesion in cerebral aqueduct region with mild hydrocephalus-- stable MRI 02/2016.  Surveillance MRI 05/2017 --dilated cerebral aqueduct related to aqueductal stenosis and subsequent mild hydrocephalus (due to the 11 mm stable cystic lesion in cerebral aqueduct---?congenitial?.  . Gluten intolerance    pt reports she underwent full GI  w/u to r/o celiac dz  . Hepatic steatosis    ultrasound 08/2017  . Hyperlipidemia, mixed   . Hypertensive retinopathy of both eyes   . Proliferative diabetic retinopathy of both eyes ([place])    steroid injections 10/2017  . Sensorineural hearing loss of left ear    Sudden left hearing loss summer 2016--no improvement with steroids 01/2015 so brain MRI done by Dr. Redmond Baseman and it showed brain tumor that was determined to be benign.  Pt's hearing not bad enough for hearing aid as of  06/2016.  . Type 2 diabetes with complication ([place])    diab retpthy, diabetic gastroparesis (gastric emptying study mildly abnl 03/2017)  . White coat hypertension    Past Surgical History:  Procedure Laterality Date  . CHOLECYSTECTOMY  2000  . GASTRIC EMPTYING SCAN  04/20/2017   Mildly abnormal, particularly the 1st hour of emptying.    FAMILY HISTORY Family History  Problem Relation Age of Onset  . Brain cancer Mother   . Diabetes Father   . Diabetes Maternal Grandmother   . Cataracts Maternal Grandmother   . Cervical cancer Paternal Grandmother   . Colon cancer Maternal Grandfather 53  . Amblyopia Neg Hx   . Blindness Neg Hx   . Glaucoma Neg Hx   . Macular degeneration Neg Hx   . Retinal detachment Neg Hx   . Strabismus Neg Hx   . Retinitis pigmentosa Neg Hx     SOCIAL HISTORY Social History   Tobacco Use  . Smoking status: Never Smoker  . Smokeless tobacco: Never Used  Substance Use Topics  . Alcohol use: No  . Drug use: No         OPHTHALMIC EXAM:  Base Eye Exam    Visual Acuity (Snellen - Linear)      Right Left   Dist cc 20/30 +1 20/25 +2   Dist ph cc 20/25 -1 20/25       Tonometry (Tonopen, 9:57 AM)      Right Left   Pressure 14 16       Pupils      Dark Light Shape React APD   Right 4 3 Round Brisk None   Left 4 3 Round Brisk None       Visual Fields (Counting fingers)      Left Right    Full Full       Extraocular Movement      Right Left    Full, Ortho Full, Ortho       Neuro/Psych    Oriented x3:  Yes   Mood/Affect:  Normal       Dilation    Both eyes:  1.0% Mydriacyl, 2.5% Phenylephrine @ 9:50 AM        Slit Lamp and Fundus Exam    Slit Lamp Exam      Right Left   Lids/Lashes Dermatochalasis - upper lid, Meibomian gland dysfunction, Telangiectasia Dermatochalasis - upper lid, Meibomian gland dysfunction, Telangiectasia   Conjunctiva/Sclera White and quiet White and quiet   Cornea 1+ Punctate epithelial erosions Clear    Anterior Chamber Deep and quiet Deep and quiet   Iris Round and dilated, No NVI Round and dilated, No NVI   Lens 2+ Nuclear sclerosis, 2+ Cortical cataract 2+ Nuclear sclerosis, 2+ Cortical cataract   Vitreous Vitreous syneresis, +diffuse VH -- clearing and settling inferiorly, red, clotted VH inferiorly Vitreous syneresis       Fundus Exam      Right Left  Disc Mildly hazy view, mildly blurred margin, Regressing fine NVD and fibrosis +fine NVD temporal quadrant, sharp and pink rim   C/D Ratio 0.2 0.1   Macula Blunted foveal reflex, scattered Microaneurysms, +edema, small area of exudates superior macula, cluster of IRH and exudates temporal macula -- mildly improved Blunted foveal reflex, RPE mottling, cluster Microaneurysms and Exudates temporal to fovea, +edema, scattered IRH   Vessels Hazy view, NVE, venous beading peripherally -- improved, Vascular attenuation Vascular attenuation, AV crossing changes   Periphery grossly attached, scattered DBH 360, PRP scars superior hemisphere, inferior obscured by VH Attached, scattered DBH, scattered IRH, good 360 PRP laser changes, room for fill-in if needed, scattered DBH          IMAGING AND PROCEDURES  Imaging and Procedures for @TODAY @  OCT, Retina - OU - Both Eyes       Right Eye Quality was good. Central Foveal Thickness: 317. Progression has improved. Findings include abnormal foveal contour, intraretinal fluid, no SRF, epiretinal membrane (Interval improvement in IRF and vitreous opacities).   Left Eye Quality was good. Central Foveal Thickness: 303. Progression has improved. Findings include abnormal foveal contour, intraretinal fluid, no SRF, vitreomacular adhesion  (Interval improvement in IRF, interval partial release of VMA).   Notes *Images captured and stored on drive  Diagnosis / Impression:  OD: clearing VH, improved DME, mild ERM OS: abnormal foveal contour; +IRF; preretinal fibrosis about the disc; +DME  improved  Clinical management:  See below  Abbreviations: NFP - Normal foveal profile. CME - cystoid macular edema. PED - pigment epithelial detachment. IRF - intraretinal fluid. SRF - subretinal fluid. EZ - ellipsoid zone. ERM - epiretinal membrane. ORA - outer retinal atrophy. ORT - outer retinal tubulation. SRHM - subretinal hyper-reflective material         Intravitreal Injection, Pharmacologic Agent - OD - Right Eye       Time Out 12/21/2017. 12:16 PM. Confirmed correct patient, procedure, site, and patient consented.   Anesthesia Topical anesthesia was used. Anesthetic medications included Lidocaine 2%, Proparacaine 0.5%.   Procedure Preparation included 5% betadine to ocular surface, eyelid speculum. A 30 gauge needle was used.   Injection:  1.25 mg Bevacizumab 1.25mg /0.32ml   NDC: 32951-884-16, Lot: [id number] , Expiration date: 02/07/2018   Route: Intravitreal, Site: Right Eye, Waste: 0 mL  Post-op Post injection exam found visual acuity of at least counting fingers. The patient tolerated the procedure well. There were no complications. The patient received written and verbal post procedure care education.        Intravitreal Injection, Pharmacologic Agent - OS - Left Eye       Time Out 12/21/2017. 12:15 PM. Confirmed correct patient, procedure, site, and patient consented.   Anesthesia Topical anesthesia was used. Anesthetic medications included Lidocaine 2%, Proparacaine 0.5%.   Procedure Preparation included 5% betadine to ocular surface, eyelid speculum. A 30 gauge needle was used.   Injection:  1.25 mg Bevacizumab 1.25mg /0.70ml   NDC: 80m, Lot: [id number] , Expiration date: 02/07/2018   Route: Intravitreal, Site: Left Eye, Waste: 0 mL  Post-op Post injection exam found visual acuity of at least counting fingers. The patient tolerated the procedure well. There were no complications. The patient received written and verbal post procedure  care education.                 ASSESSMENT/PLAN:    ICD-10-CM   1. Vitreous hemorrhage of right eye (HCC) H43.11 Intravitreal Injection, Pharmacologic Agent - OD - Right Eye  Bevacizumab (AVASTIN) SOLN 1.25 mg  2. Proliferative diabetic retinopathy of both eyes with macular edema associated with type 2 diabetes mellitus (HCC) Z66.0630 Intravitreal Injection, Pharmacologic Agent - OD - Right Eye    Intravitreal Injection, Pharmacologic Agent - OS - Left Eye    Bevacizumab (AVASTIN) SOLN 1.25 mg    Bevacizumab (AVASTIN) SOLN 1.25 mg  3. Retinal edema H35.81 OCT, Retina - OU - Both Eyes  4. Hypertensive retinopathy of both eyes H35.033   5. Essential hypertension I10   6. Combined forms of age-related cataract of both eyes H25.813     1. Vitreous Hemorrhage OD- improving - secondary to PDR as described below - S/P IVA OD #1 (09.20.19), #2 (10.25.19) - S/P PRP OD #1 (09.27.19) -- limited by [facility]; mostly superior PRP - VH improved with superior view clearing, heme settling inferiorly - recommend IVA OD #2 today, 10.25.19 - see below - f/u 2-3 weeks f/u PDR OU w/ VH OD, OCT, repeat FA, possible PRP fill in OD  2,3. Proliferative diabetic retinopathy w/ DME, OU - s/p IVA OD #1 9.20.19 - s/p IVA OS #1 9.27.19 - HbA1c 7.7% (10.2.19) from 10.3% 3 mos ago (6.26.19) - exam shows OD with improving VH and peripheral NV and cap nonperfusion  OS -- +NVD, +NVE, scattered MA and exudates and +DME - FA (9.20.19) shows +NVE OU and leaking MA and capillary nonperfusion - NV regressing OU - OCT confirms diabetic macular edema OU -- improving - S/P PRP OS (09.20.19) - s/p partial PRP OD (9.27.19) -- inferior periphery obscured by [facility] - recommend IVA OU #2 today, 10.25.19 - RBA of procedure discussed, questions answered - informed consent obtained and signed - see procedure note - f/u 2-3 weeks f/u PDR OU w/ VH OD, OCT, repeat FA, possible PRP fill in OD  4,5. Hypertensive retinopathy OU -  discussed importance of tight BP control - monitor  6. Combined form age related cataract OU-  - The symptoms of cataract, surgical options, and treatments and risks were discussed with patient. - discussed diagnosis and progression - not yet visually significant - monitor for now   Ophthalmic Meds Ordered this visit:  Meds ordered this encounter  Medications  . Bevacizumab (AVASTIN) SOLN 1.25 mg  . Bevacizumab (AVASTIN) SOLN 1.25 mg       Return for 2-3 weeks, VH, PDR, DFE, OCT, FA.  There are no Patient Instructions on file for this visit.   Explained the diagnoses, plan, and follow up with the patient and they expressed understanding.  Patient expressed understanding of the importance of proper follow up care.   This document serves as a record of services personally performed by Gardiner Sleeper, MD, PhD. It was created on their behalf by Ernest Mallick, OA, an ophthalmic assistant. The creation of this record is the provider's dictation and/or activities during the visit.    Electronically signed by: Ernest Mallick, OA  10.24.19 11:23 PM    Gardiner Sleeper, M.D., Ph.D. Diseases & Surgery of the Retina and Vitreous [facility]   I have reviewed the above documentation for accuracy and completeness, and I agree with the above. Gardiner Sleeper, M.D., Ph.D. 12/23/17 11:23 PM    Abbreviations: M myopia (nearsighted); A astigmatism; H hyperopia (farsighted); P presbyopia; Mrx spectacle prescription;  CTL contact lenses; OD right eye; OS left eye; OU both eyes  XT exotropia; ET esotropia; PEK punctate epithelial keratitis; PEE punctate epithelial erosions; DES dry eye syndrome;  MGD meibomian gland dysfunction; ATs artificial tears; PFAT's preservative free artificial tears; [place] nuclear sclerotic cataract; PSC posterior subcapsular cataract; ERM epi-retinal membrane; PVD posterior vitreous detachment; RD retinal detachment; DM diabetes mellitus; DR diabetic  retinopathy; NPDR non-proliferative diabetic retinopathy; PDR proliferative diabetic retinopathy; CSME clinically significant macular edema; DME diabetic macular edema; dbh dot blot hemorrhages; CWS cotton wool spot; POAG primary open angle glaucoma; C/D cup-to-disc ratio; HVF humphrey visual field; GVF goldmann visual field; OCT optical coherence tomography; IOP intraocular pressure; BRVO Branch retinal vein occlusion; CRVO central retinal vein occlusion; CRAO central retinal artery occlusion; BRAO branch retinal artery occlusion; RT retinal tear; SB scleral buckle; PPV pars plana vitrectomy; VH Vitreous hemorrhage; PRP panretinal laser photocoagulation; IVK intravitreal kenalog; VMT vitreomacular traction; MH Macular hole;  NVD neovascularization of the disc; NVE neovascularization elsewhere; AREDS age related eye disease study; ARMD age related macular degeneration; POAG primary open angle glaucoma; EBMD epithelial/anterior basement membrane dystrophy; ACIOL anterior chamber intraocular lens; IOL intraocular lens; PCIOL posterior chamber intraocular lens; Phaco/IOL phacoemulsification with intraocular lens placement; [place] photorefractive keratectomy; LASIK laser assisted in situ keratomileusis; HTN hypertension; DM diabetes mellitus; COPD chronic obstructive pulmonary disease

## 2017-12-21 ENCOUNTER — Ambulatory Visit (INDEPENDENT_AMBULATORY_CARE_PROVIDER_SITE_OTHER): Payer: BLUE CROSS/BLUE SHIELD | Admitting: Ophthalmology

## 2017-12-21 ENCOUNTER — Encounter (INDEPENDENT_AMBULATORY_CARE_PROVIDER_SITE_OTHER): Payer: Self-pay | Admitting: Ophthalmology

## 2017-12-21 DIAGNOSIS — H25813 Combined forms of age-related cataract, bilateral: Secondary | ICD-10-CM

## 2017-12-21 DIAGNOSIS — H35033 Hypertensive retinopathy, bilateral: Secondary | ICD-10-CM

## 2017-12-21 DIAGNOSIS — H3581 Retinal edema: Secondary | ICD-10-CM

## 2017-12-21 DIAGNOSIS — E113513 Type 2 diabetes mellitus with proliferative diabetic retinopathy with macular edema, bilateral: Secondary | ICD-10-CM

## 2017-12-21 DIAGNOSIS — H4311 Vitreous hemorrhage, right eye: Secondary | ICD-10-CM | POA: Diagnosis not present

## 2017-12-21 DIAGNOSIS — I10 Essential (primary) hypertension: Secondary | ICD-10-CM

## 2017-12-23 ENCOUNTER — Encounter (INDEPENDENT_AMBULATORY_CARE_PROVIDER_SITE_OTHER): Payer: Self-pay | Admitting: Ophthalmology

## 2017-12-23 ENCOUNTER — Other Ambulatory Visit: Payer: Self-pay | Admitting: Family Medicine

## 2017-12-23 DIAGNOSIS — H4311 Vitreous hemorrhage, right eye: Secondary | ICD-10-CM | POA: Diagnosis not present

## 2017-12-23 DIAGNOSIS — E113513 Type 2 diabetes mellitus with proliferative diabetic retinopathy with macular edema, bilateral: Secondary | ICD-10-CM | POA: Diagnosis not present

## 2017-12-23 MED ORDER — BEVACIZUMAB CHEMO INJECTION 1.25MG/0.05ML SYRINGE FOR KALEIDOSCOPE
1.2500 mg | INTRAVITREAL | Status: DC
  Administered 2017-12-23: 1.25 mg via INTRAVITREAL

## 2018-01-11 ENCOUNTER — Ambulatory Visit (INDEPENDENT_AMBULATORY_CARE_PROVIDER_SITE_OTHER): Payer: BLUE CROSS/BLUE SHIELD | Admitting: Ophthalmology

## 2018-01-11 ENCOUNTER — Encounter (INDEPENDENT_AMBULATORY_CARE_PROVIDER_SITE_OTHER): Payer: BLUE CROSS/BLUE SHIELD | Admitting: Ophthalmology

## 2018-01-11 ENCOUNTER — Encounter (INDEPENDENT_AMBULATORY_CARE_PROVIDER_SITE_OTHER): Payer: Self-pay | Admitting: Ophthalmology

## 2018-01-11 DIAGNOSIS — H35033 Hypertensive retinopathy, bilateral: Secondary | ICD-10-CM

## 2018-01-11 DIAGNOSIS — I10 Essential (primary) hypertension: Secondary | ICD-10-CM

## 2018-01-11 DIAGNOSIS — H25813 Combined forms of age-related cataract, bilateral: Secondary | ICD-10-CM

## 2018-01-11 DIAGNOSIS — E113513 Type 2 diabetes mellitus with proliferative diabetic retinopathy with macular edema, bilateral: Secondary | ICD-10-CM | POA: Diagnosis not present

## 2018-01-11 DIAGNOSIS — H3581 Retinal edema: Secondary | ICD-10-CM | POA: Diagnosis not present

## 2018-01-11 DIAGNOSIS — H4311 Vitreous hemorrhage, right eye: Secondary | ICD-10-CM | POA: Diagnosis not present

## 2018-01-11 NOTE — Progress Notes (Addendum)
[facility] Note  01/11/2018     CHIEF COMPLAINT Patient presents for Retina Follow Up   HISTORY OF PRESENT ILLNESS: Gloria Gomez is a 47 y.o. female who presents to the clinic today for:   HPI    Retina Follow Up    Patient presents with  Other.  In right eye.  Severity is moderate.  Duration of 3 weeks.  Since onset it is stable.  I, the attending physician,  performed the HPI with the patient and updated documentation appropriately.          Comments    Patient here for f/u VH OD/PDR with DME OU-patient states vision stable OU. No eye pain/discomfort. BS was 139 yesterday am. Last A1c was 7.03 December 2017.        Last edited by Bernarda Caffey, MD on 01/11/2018  2:47 PM. (History)    pt states she feels like vision has gotten better, she feels like the hemorrhage has cleared up a lot  Referring physician: Tammi Sou, MD [address] [place], [place] [address]  HISTORICAL INFORMATION:   Selected notes from the MEDICAL RECORD NUMBER Referred for DM exam LEE:  Ocular Hx-NPDR PMH-DM (takes invokana and metformin), high cholesterol, benign brain tumor, white coat HTN    CURRENT MEDICATIONS: No current outpatient medications on file. (Ophthalmic Drugs)   No current facility-administered medications for this visit.  (Ophthalmic Drugs)   Current Outpatient Medications (Other)  Medication Sig  . glipiZIDE (GLUCOTROL XL) 10 MG 24 hr tablet TAKE 1 TABLET BY MOUTH WITH BREAKFAST  . HYDROcodone-acetaminophen (NORCO/VICODIN) 5-325 MG tablet Take 1-2 tablets by mouth every 6 (six) hours as needed for moderate pain.  . INVOKANA 300 MG TABS tablet TAKE 1 TABLET BY MOUTH ONCE DAILY BEFORE  BREAKFAST  . losartan (COZAAR) 50 MG tablet Take 1 tablet (50 mg total) by mouth daily.  . metFORMIN (GLUCOPHAGE-XR) 500 MG 24 hr tablet TAKE 2 TABLETS BY MOUTH TWICE DAILY  . pantoprazole (PROTONIX) 40 MG tablet TAKE 1 TABLET BY MOUTH ONCE DAILY  .  promethazine (PHENERGAN) 12.5 MG tablet 1-2 tabs po q6h prn nausea  . temazepam (RESTORIL) 15 MG capsule Take 1 capsule (15 mg total) by mouth at bedtime as needed for sleep.   Current Facility-Administered Medications (Other)  Medication Route  . Bevacizumab (AVASTIN) SOLN 1.25 mg Intravitreal  . Bevacizumab (AVASTIN) SOLN 1.25 mg Intravitreal  . Bevacizumab (AVASTIN) SOLN 1.25 mg Intravitreal  . Bevacizumab (AVASTIN) SOLN 1.25 mg Intravitreal  . Bevacizumab (AVASTIN) SOLN 1.25 mg Intravitreal  . Bevacizumab (AVASTIN) SOLN 1.25 mg Intravitreal      REVIEW OF SYSTEMS: ROS    Positive for: Endocrine, Cardiovascular, Eyes   Negative for: Constitutional, Gastrointestinal, Neurological, Skin, Genitourinary, Musculoskeletal, HENT, Respiratory, Psychiatric, Allergic/Imm, Heme/Lymph   Last edited by Roselee Nova D on 01/11/2018  9:38 AM. (History)       ALLERGIES Allergies  Allergen Reactions  . Gluten Meal Swelling  . Lisinopril Cough  . Pioglitazone Other (See Comments)    ELEVATED glucoses + worse chronic nausea    PAST MEDICAL HISTORY Past Medical History:  Diagnosis Date  . Abdominal bloating    likely from diab gastroparesis.  Dr. Havery Moros to do EGD as of 10/2017 GI eval.  . Benign brain tumor ([address])    Cystic lesion in cerebral aqueduct region with mild hydrocephalus-- stable MRI 02/2016.  Surveillance MRI 05/2017 --dilated cerebral aqueduct related to aqueductal stenosis and  subsequent mild hydrocephalus (due to the 11 mm stable cystic lesion in cerebral aqueduct---?congenitial?.  . Gluten intolerance    pt reports she underwent full GI w/u to r/o celiac dz  . Hepatic steatosis    ultrasound 08/2017  . Hyperlipidemia, mixed   . Hypertensive retinopathy of both eyes   . Proliferative diabetic retinopathy of both eyes ([place])    steroid injections 10/2017  . Sensorineural hearing loss of left ear    Sudden left hearing loss summer 2016--no improvement with steroids 01/2015  so brain MRI done by Dr. Redmond Baseman and it showed brain tumor that was determined to be benign.  Pt's hearing not bad enough for hearing aid as of 06/2016.  . Type 2 diabetes with complication ([place])    diab retpthy, diabetic gastroparesis (gastric emptying study mildly abnl 03/2017)  . White coat hypertension    Past Surgical History:  Procedure Laterality Date  . CHOLECYSTECTOMY  2000  . GASTRIC EMPTYING SCAN  04/20/2017   Mildly abnormal, particularly the 1st hour of emptying.    FAMILY HISTORY Family History  Problem Relation Age of Onset  . Brain cancer Mother   . Diabetes Father   . Diabetes Maternal Grandmother   . Cataracts Maternal Grandmother   . Cervical cancer Paternal Grandmother   . Colon cancer Maternal Grandfather 11  . Amblyopia Neg Hx   . Blindness Neg Hx   . Glaucoma Neg Hx   . Macular degeneration Neg Hx   . Retinal detachment Neg Hx   . Strabismus Neg Hx   . Retinitis pigmentosa Neg Hx     SOCIAL HISTORY Social History   Tobacco Use  . Smoking status: Never Smoker  . Smokeless tobacco: Never Used  Substance Use Topics  . Alcohol use: No  . Drug use: No         OPHTHALMIC EXAM:  Base Eye Exam    Visual Acuity (Snellen - Linear)      Right Left   Dist cc 20/30 -1 20/25 -1   Dist ph cc 20/20 -2 NI   Correction:  Glasses       Tonometry (Tonopen, 9:46 AM)      Right Left   Pressure 20 19       Pupils      Dark Light Shape React APD   Right 4 3 Round Brisk None   Left 4 3 Round Brisk None       Visual Fields (Counting fingers)      Left Right    Full Full       Extraocular Movement      Right Left    Full, Ortho Full, Ortho       Neuro/Psych    Oriented x3:  Yes   Mood/Affect:  Normal       Dilation    Both eyes:  1.0% Mydriacyl, 2.5% Phenylephrine @ 9:46 AM       Dilation #2    Both eyes:  1.0% Mydriacyl, 2.5% Phenylephrine @ 10:41 AM        Slit Lamp and Fundus Exam    Slit Lamp Exam      Right Left   Lids/Lashes  Dermatochalasis - upper lid, Meibomian gland dysfunction, Telangiectasia Dermatochalasis - upper lid, Meibomian gland dysfunction, Telangiectasia   Conjunctiva/Sclera White and quiet White and quiet   Cornea 1+ Punctate epithelial erosions Clear   Anterior Chamber Deep and quiet Deep and quiet   Iris Round and dilated, No NVI  Round and dilated, No NVI   Lens 2+ Nuclear sclerosis, 2+ Cortical cataract 2+ Nuclear sclerosis, 2+ Cortical cataract   Vitreous Vitreous syneresis, VH clearing and settling inferiorly Vitreous syneresis       Fundus Exam      Right Left   Disc Regressed fine NVD and fibrosis Regressed NVE and fibrosis   C/D Ratio 0.2 0.1   Macula good foveal reflex, cluster of Microaneurysms and exudates temporal macula, scattered MA's Blunted foveal reflex, RPE mottling, cluster Microaneurysms and Exudates temporal to fovea, scattered MA's   Vessels Vascular attenuation, regressing NV Vascular attenuation, mild Tortuousity   Periphery Attached, obscured view inferiorly from [facility], superior PRP room for fill in inferiorly Attached, scattered DBH, good 360 PRP laser changes        Refraction    Wearing Rx      Sphere Cylinder   Right -3.75 Sphere   Left -3.75 Sphere          IMAGING AND PROCEDURES  Imaging and Procedures for @TODAY @  OCT, Retina - OU - Both Eyes       Right Eye Quality was good. Central Foveal Thickness: 317. Progression has improved. Findings include intraretinal fluid, no SRF, epiretinal membrane, normal foveal contour, intraretinal hyper-reflective material (Interval improvement in IRF and vitreous opacities).   Left Eye Quality was good. Central Foveal Thickness: 304. Progression has improved. Findings include intraretinal fluid, no SRF, vitreomacular adhesion , normal foveal contour, intraretinal hyper-reflective material (Interval improvement in IRF).   Notes *Images captured and stored on drive  Diagnosis / Impression:  OD: clearing VH,  improved DME, mild ERM OS: abnormal foveal contour; +IRF; preretinal fibrosis about the disc; +DME improved  Clinical management:  See below  Abbreviations: NFP - Normal foveal profile. CME - cystoid macular edema. PED - pigment epithelial detachment. IRF - intraretinal fluid. SRF - subretinal fluid. EZ - ellipsoid zone. ERM - epiretinal membrane. ORA - outer retinal atrophy. ORT - outer retinal tubulation. SRHM - subretinal hyper-reflective material         Fluorescein Angiography Optos (Transit OS)       Right Eye   Progression has improved. Early phase findings include retinal neovascularization, vascular perfusion defect, microaneurysm, leakage, blockage (Interval improvement in [place]). Mid/Late phase findings include blockage, retinal neovascularization, vascular perfusion defect, leakage, microaneurysm.   Left Eye Early phase findings include vascular perfusion defect, microaneurysm. Mid/Late phase findings include vascular perfusion defect, microaneurysm, leakage, staining.   Notes Images captured and stored on drive;   Impression: PDR OU OD: central blockage consistent with VH; peripheral MA, NV w/ leakage and capillary nonperfusion OS: MA, +NVD, +NVE -- all with leakage; significant areas of capillary nonperfusion -- greatest inferiorly        Intravitreal Injection, Pharmacologic Agent - OS - Left Eye       Time Out 01/11/2018. 11:54 AM. Confirmed correct patient, procedure, site, and patient consented.   Anesthesia Topical anesthesia was used. Anesthetic medications included Lidocaine 2%, Proparacaine 0.5%.   Procedure Preparation included 5% betadine to ocular surface, eyelid speculum. A supplied needle was used.   Injection:  1.25 mg Bevacizumab 1.25mg /0.70ml   NDC: 50242-060-01, Lot: [id number] , Expiration date: 02/07/2018   Route: Intravitreal, Site: Left Eye, Waste: 0 mL  Post-op Post injection exam found visual acuity of at least counting  fingers. The patient tolerated the procedure well. There were no complications. The patient received written and verbal post procedure care education.        Intravitreal  Injection, Pharmacologic Agent - OD - Right Eye       Time Out 01/11/2018. 11:46 AM. Confirmed correct patient, procedure, site, and patient consented.   Anesthesia Topical anesthesia was used. Anesthetic medications included Lidocaine 2%, Proparacaine 0.5%.   Procedure Preparation included 5% betadine to ocular surface, eyelid speculum. A 30 gauge needle was used.   Injection:  1.25 mg Bevacizumab 1.25mg /0.28ml   NDC: 45809-983-38, Lot: [id number] , Expiration date: 03/13/2018   Route: Intravitreal, Site: Right Eye, Waste: 0 mL  Post-op Post injection exam found visual acuity of at least counting fingers. The patient tolerated the procedure well. There were no complications. The patient received written and verbal post procedure care education.                 ASSESSMENT/PLAN:    ICD-10-CM   1. Vitreous hemorrhage of right eye (HCC) H43.11 Fluorescein Angiography Optos (Transit OS)    Intravitreal Injection, Pharmacologic Agent - OD - Right Eye    Bevacizumab (AVASTIN) SOLN 1.25 mg  2. Proliferative diabetic retinopathy of both eyes with macular edema associated with type 2 diabetes mellitus (HCC) 03/26/2018 Fluorescein Angiography Optos (Transit OS)    Intravitreal Injection, Pharmacologic Agent - OS - Left Eye    Intravitreal Injection, Pharmacologic Agent - OD - Right Eye    Bevacizumab (AVASTIN) SOLN 1.25 mg    Bevacizumab (AVASTIN) SOLN 1.25 mg  3. Retinal edema H35.81 OCT, Retina - OU - Both Eyes  4. Hypertensive retinopathy of both eyes H35.033 Fluorescein Angiography Optos (Transit OS)  5. Essential hypertension I10   6. Combined forms of age-related cataract of both eyes H25.813     1. Vitreous Hemorrhage OD- improving - secondary to PDR as described below - S/P IVA OD #1 (09.20.19), #2  (10.25.19) - S/P PRP OD #1 (09.27.19) -- limited by [facility]; mostly superior PRP - VH improved with superior view clearing, heme settling inferiorly - recommend IVA OU #3 today, 11.15.19 - see below  2,3. Proliferative diabetic retinopathy w/ DME, OU - s/p IVA OD #1 9.20.19, #2 (10.25.19) - s/p IVA OS #1 9.27.19, #2 (10.25.19) - HbA1c 7.7% (10.2.19) from 10.3% 3 mos ago (6.26.19) - exam shows OD with improving VH and peripheral NV and cap nonperfusion  OS -- +NVD, +NVE, scattered MA and exudates and +DME - FA (9.20.19) shows +NVE OU and leaking MA and capillary nonperfusion - repeat FA today 11.15.19 shows NV regressing OU - OCT confirms diabetic macular edema OU -- improving - S/P PRP OS (09.20.19) - s/p partial PRP OD (9.27.19) -- inferior periphery obscured by [facility] - recommend IVA OU #3 today, 11.15.19 - RBA of procedure discussed, questions answered - informed consent obtained and signed - see procedure note - f/u 1-2 weeks for fill-in PRP OD  4,5. Hypertensive retinopathy OU - discussed importance of tight BP control - monitor  6. Combined form age related cataract OU-  - The symptoms of cataract, surgical options, and treatments and risks were discussed with patient. - discussed diagnosis and progression - not yet visually significant - monitor for now   Ophthalmic Meds Ordered this visit:  Meds ordered this encounter  Medications  . Bevacizumab (AVASTIN) SOLN 1.25 mg  . Bevacizumab (AVASTIN) SOLN 1.25 mg       Return for F/U 1-2 weeks, PDR OU, DFE, OCT, PRP OD.  There are no Patient Instructions on file for this visit.   Explained the diagnoses, plan, and follow up with the patient and they expressed understanding.  Patient expressed understanding of the importance of proper follow up care.   This document serves as a record of services personally performed by Gardiner Sleeper, MD, PhD. It was created on their behalf by Ernest Mallick, OA, an ophthalmic assistant. The  creation of this record is the provider's dictation and/or activities during the visit.    Electronically signed by: Ernest Mallick, OA 11.15.19 11:58 PM    Gardiner Sleeper, M.D., Ph.D. Diseases & Surgery of the Retina and Vitreous [facility]   I have reviewed the above documentation for accuracy and completeness, and I agree with the above. Gardiner Sleeper, M.D., Ph.D. 01/15/18 12:07 AM    Abbreviations: M myopia (nearsighted); A astigmatism; H hyperopia (farsighted); P presbyopia; Mrx spectacle prescription;  CTL contact lenses; OD right eye; OS left eye; OU both eyes  XT exotropia; ET esotropia; PEK punctate epithelial keratitis; PEE punctate epithelial erosions; DES dry eye syndrome; MGD meibomian gland dysfunction; ATs artificial tears; PFAT's preservative free artificial tears; [place] nuclear sclerotic cataract; PSC posterior subcapsular cataract; ERM epi-retinal membrane; PVD posterior vitreous detachment; RD retinal detachment; DM diabetes mellitus; DR diabetic retinopathy; NPDR non-proliferative diabetic retinopathy; PDR proliferative diabetic retinopathy; CSME clinically significant macular edema; DME diabetic macular edema; dbh dot blot hemorrhages; CWS cotton wool spot; POAG primary open angle glaucoma; C/D cup-to-disc ratio; HVF humphrey visual field; GVF goldmann visual field; OCT optical coherence tomography; IOP intraocular pressure; BRVO Branch retinal vein occlusion; CRVO central retinal vein occlusion; CRAO central retinal artery occlusion; BRAO branch retinal artery occlusion; RT retinal tear; SB scleral buckle; PPV pars plana vitrectomy; VH Vitreous hemorrhage; PRP panretinal laser photocoagulation; IVK intravitreal kenalog; VMT vitreomacular traction; MH Macular hole;  NVD neovascularization of the disc; NVE neovascularization elsewhere; AREDS age related eye disease study; ARMD age related macular degeneration; POAG primary open angle glaucoma; EBMD  epithelial/anterior basement membrane dystrophy; ACIOL anterior chamber intraocular lens; IOL intraocular lens; PCIOL posterior chamber intraocular lens; Phaco/IOL phacoemulsification with intraocular lens placement; [place] photorefractive keratectomy; LASIK laser assisted in situ keratomileusis; HTN hypertension; DM diabetes mellitus; COPD chronic obstructive pulmonary disease

## 2018-01-14 ENCOUNTER — Encounter (INDEPENDENT_AMBULATORY_CARE_PROVIDER_SITE_OTHER): Payer: Self-pay | Admitting: Ophthalmology

## 2018-01-14 DIAGNOSIS — E113513 Type 2 diabetes mellitus with proliferative diabetic retinopathy with macular edema, bilateral: Secondary | ICD-10-CM | POA: Diagnosis not present

## 2018-01-14 MED ORDER — BEVACIZUMAB CHEMO INJECTION 1.25MG/0.05ML SYRINGE FOR KALEIDOSCOPE
1.2500 mg | INTRAVITREAL | Status: DC
  Administered 2018-01-14: 1.25 mg via INTRAVITREAL

## 2018-01-16 NOTE — Progress Notes (Signed)
[facility] Note  01/17/2018     CHIEF COMPLAINT Patient presents for Retina Follow Up   HISTORY OF PRESENT ILLNESS: Gloria Gomez is a 47 y.o. female who presents to the clinic today for:   HPI    Retina Follow Up    Patient presents with  Diabetic Retinopathy.  In both eyes.  This started weeks ago.  Severity is mild.  Duration of weeks.  Since onset it is gradually improving.  I, the attending physician,  performed the HPI with the patient and updated documentation appropriately.          Comments    47 y/o female pt here for 1 wk f/u for PDR OU and PRP OD.  Pt is s/p PRP #1 on 09.27.19.  Has not noticed any change in [place] OU, but has had several small black floaters OS following a fall a couple of days ago.  Denies pain, flashes.  No gtts.  BS 149 2 days ago.  A1C 7.7 1 month ago.       Last edited by Bernarda Caffey, MD on 01/17/2018 10:52 AM. (History)    pt states she feels like vision has gotten better, she feels like the hemorrhage has cleared up a lot  Referring physician: Tammi Sou, MD [address] [place], [place] [address]  HISTORICAL INFORMATION:   Selected notes from the MEDICAL RECORD NUMBER Referred for DM exam LEE:  Ocular Hx-NPDR PMH-DM (takes invokana and metformin), high cholesterol, benign brain tumor, white coat HTN    CURRENT MEDICATIONS: No current outpatient medications on file. (Ophthalmic Drugs)   No current facility-administered medications for this visit.  (Ophthalmic Drugs)   Current Outpatient Medications (Other)  Medication Sig  . glipiZIDE (GLUCOTROL XL) 10 MG 24 hr tablet TAKE 1 TABLET BY MOUTH WITH BREAKFAST  . HYDROcodone-acetaminophen (NORCO/VICODIN) 5-325 MG tablet Take 1-2 tablets by mouth every 6 (six) hours as needed for moderate pain.  . INVOKANA 300 MG TABS tablet TAKE 1 TABLET BY MOUTH ONCE DAILY BEFORE  BREAKFAST  . losartan (COZAAR) 50 MG tablet Take 1 tablet (50 mg total) by mouth  daily.  . metFORMIN (GLUCOPHAGE-XR) 500 MG 24 hr tablet TAKE 2 TABLETS BY MOUTH TWICE DAILY  . pantoprazole (PROTONIX) 40 MG tablet TAKE 1 TABLET BY MOUTH ONCE DAILY  . promethazine (PHENERGAN) 12.5 MG tablet 1-2 tabs po q6h prn nausea  . temazepam (RESTORIL) 15 MG capsule Take 1 capsule (15 mg total) by mouth at bedtime as needed for sleep.   Current Facility-Administered Medications (Other)  Medication Route  . Bevacizumab (AVASTIN) SOLN 1.25 mg Intravitreal  . Bevacizumab (AVASTIN) SOLN 1.25 mg Intravitreal  . Bevacizumab (AVASTIN) SOLN 1.25 mg Intravitreal  . Bevacizumab (AVASTIN) SOLN 1.25 mg Intravitreal  . Bevacizumab (AVASTIN) SOLN 1.25 mg Intravitreal  . Bevacizumab (AVASTIN) SOLN 1.25 mg Intravitreal      REVIEW OF SYSTEMS: ROS    Positive for: Endocrine, Eyes   Negative for: Constitutional, Gastrointestinal, Neurological, Skin, Genitourinary, Musculoskeletal, HENT, Cardiovascular, Respiratory, Psychiatric, Allergic/Imm, Heme/Lymph   Last edited by Matthew Folks, COA on 01/17/2018  8:51 AM. (History)       ALLERGIES Allergies  Allergen Reactions  . Gluten Meal Swelling  . Lisinopril Cough  . Pioglitazone Other (See Comments)    ELEVATED glucoses + worse chronic nausea    PAST MEDICAL HISTORY Past Medical History:  Diagnosis Date  . Abdominal bloating    likely from diab  gastroparesis.  Dr. Havery Moros to do EGD as of 10/2017 GI eval.  . Benign brain tumor ([place])    Cystic lesion in cerebral aqueduct region with mild hydrocephalus-- stable MRI 02/2016.  Surveillance MRI 05/2017 --dilated cerebral aqueduct related to aqueductal stenosis and subsequent mild hydrocephalus (due to the 11 mm stable cystic lesion in cerebral aqueduct---?congenitial?.  . Gluten intolerance    pt reports she underwent full GI w/u to r/o celiac dz  . Hepatic steatosis    ultrasound 08/2017  . Hyperlipidemia, mixed   . Hypertensive retinopathy of both eyes   . Proliferative diabetic  retinopathy of both eyes ([place])    steroid injections 10/2017  . Sensorineural hearing loss of left ear    Sudden left hearing loss summer 2016--no improvement with steroids 01/2015 so brain MRI done by Dr. Redmond Baseman and it showed brain tumor that was determined to be benign.  Pt's hearing not bad enough for hearing aid as of 06/2016.  . Type 2 diabetes with complication ([place])    diab retpthy, diabetic gastroparesis (gastric emptying study mildly abnl 03/2017)  . White coat hypertension    Past Surgical History:  Procedure Laterality Date  . CHOLECYSTECTOMY  2000  . GASTRIC EMPTYING SCAN  04/20/2017   Mildly abnormal, particularly the 1st hour of emptying.    FAMILY HISTORY Family History  Problem Relation Age of Onset  . Brain cancer Mother   . Diabetes Father   . Diabetes Maternal Grandmother   . Cataracts Maternal Grandmother   . Cervical cancer Paternal Grandmother   . Colon cancer Maternal Grandfather 14  . Amblyopia Neg Hx   . Blindness Neg Hx   . Glaucoma Neg Hx   . Macular degeneration Neg Hx   . Retinal detachment Neg Hx   . Strabismus Neg Hx   . Retinitis pigmentosa Neg Hx     SOCIAL HISTORY Social History   Tobacco Use  . Smoking status: Never Smoker  . Smokeless tobacco: Never Used  Substance Use Topics  . Alcohol use: No  . Drug use: No         OPHTHALMIC EXAM:  Base Eye Exam    Visual Acuity (Snellen - Linear)      Right Left   Dist cc 20/20 -2 20/20 -2   Correction:  Glasses       Tonometry (Tonopen, 8:55 AM)      Right Left   Pressure 17 17       Pupils      Dark Light Shape React APD   Right 4 3 Round Brisk None   Left 4 3 Round Brisk None       Visual Fields (Counting fingers)      Left Right    Full Full       Extraocular Movement      Right Left    Full, Ortho Full, Ortho       Neuro/Psych    Oriented x3:  Yes   Mood/Affect:  Normal       Dilation    Both eyes:  1.0% Mydriacyl, 2.5% Phenylephrine @ 8:55 AM        Slit  Lamp and Fundus Exam    Slit Lamp Exam      Right Left   Lids/Lashes Dermatochalasis - upper lid, Meibomian gland dysfunction, Telangiectasia Dermatochalasis - upper lid, Meibomian gland dysfunction, Telangiectasia   Conjunctiva/Sclera White and quiet White and quiet   Cornea 1+ Punctate epithelial erosions Clear  Anterior Chamber Deep and quiet Deep and quiet   Iris Round and dilated, No NVI Round and dilated, No NVI   Lens 2+ Nuclear sclerosis, 2+ Cortical cataract 2+ Nuclear sclerosis, 2+ Cortical cataract   Vitreous Vitreous syneresis, VH clearing and settling inferiorly Vitreous syneresis       Fundus Exam      Right Left   Disc Regressed fine NVD and fibrosis Regressed NVE and fibrosis   C/D Ratio 0.2 0.1   Macula good foveal reflex, cluster of Microaneurysms and exudates temporal macula, scattered MA's Blunted foveal reflex, RPE mottling, cluster Microaneurysms and Exudates temporal to fovea, scattered MA's   Vessels Vascular attenuation, regressing NV Vascular attenuation, mild Tortuousity   Periphery Attached, obscured view inferiorly from [facility], superior PRP room for fill in inferiorly and posteriorly Attached, scattered DBH, good 360 PRP laser changes          IMAGING AND PROCEDURES  Imaging and Procedures for @TODAY @  Panretinal Photocoagulation - OD - Right Eye       LASER PROCEDURE NOTE  Diagnosis:   Proliferative Diabetic Retinopathy, RIGHT EYE  Procedure:  Pan-retinal photocoagulation using slit lamp laser, RIGHT EYE, fill-in  Anesthesia:  Topical  Surgeon: Bernarda Caffey, MD, PhD   Informed consent obtained, operative eye marked, and time out performed prior to initiation of laser.   Lumenis [id number] slit lamp laser Pattern: 3x3 square Power: 300 mW Duration: 30 msec  Spot size: 200 microns  # spots: 1761 spots  Complications: None.  Notes: significant vitreous heme obscuring view and preventing laser up take inferiorly and scattered focal  areas  RTC: 2 wks   Patient tolerated the procedure well and received written and verbal post-procedure care information/education.                  ASSESSMENT/PLAN:    ICD-10-CM   1. Vitreous hemorrhage of right eye (HCC) H43.11 Panretinal Photocoagulation - OD - Right Eye  2. Proliferative diabetic retinopathy of both eyes with macular edema associated with type 2 diabetes mellitus ([place]) Y07.3710 Panretinal Photocoagulation - OD - Right Eye  3. Retinal edema H35.81   4. Hypertensive retinopathy of both eyes H35.033   5. Essential hypertension I10   6. Combined forms of age-related cataract of both eyes H25.813     1. Vitreous Hemorrhage OD- improving - secondary to PDR as described below - S/P IVA OD #1 (09.20.19), #2 (10.25.19), #3 (11.15.19) - S/P PRP OD #1 (09.27.19) -- limited by [facility]; mostly superior PRP - VH improved with superior view clearing, heme settling inferiorly - see below  2,3. Proliferative diabetic retinopathy w/ DME, OU - s/p IVA OD #1 9.20.19, #2 (10.25.19), #3 (11.15.19) - s/p IVA OS #1 9.27.19, #2 (10.25.19), #3 (11.15.19) - HbA1c 7.7% (10.2.19) from 10.3% 3 mos ago (6.26.19) - exam shows OD with improving VH and peripheral NV and cap nonperfusion  OS -- +NVD, +NVE, scattered MA and exudates and +DME - FA (9.20.19) shows +NVE OU and leaking MA and capillary nonperfusion - repeat FA today 11.15.19 shows NV regressing OU - OCT confirms diabetic macular edema OU -- improving - S/P PRP OS (09.20.19) - s/p partial PRP OD (9.27.19) -- inferior periphery obscured by [facility] - recommend PRP OD today, 11.21.19 - RBA of procedure discussed, questions answered - informed consent obtained and signed - see procedure note - start PF QID x7 days OD - f/u 12.13.19 or later for DFE/OCT/possible injection(s)  4,5. Hypertensive retinopathy OU - discussed importance  of tight BP control - monitor  6. Combined form age related cataract OU-  - The symptoms of  cataract, surgical options, and treatments and risks were discussed with patient. - discussed diagnosis and progression - not yet visually significant - monitor for now   Ophthalmic Meds Ordered this visit:  No orders of the defined types were placed in this encounter.      Return in about 22 days (around 02/08/2018) for Dilated Exam, OCT, Possible Injxn.  There are no Patient Instructions on file for this visit.   Explained the diagnoses, plan, and follow up with the patient and they expressed understanding.  Patient expressed understanding of the importance of proper follow up care.   This document serves as a record of services personally performed by Gardiner Sleeper, MD, PhD. It was created on their behalf by Ernest Mallick, OA, an ophthalmic assistant. The creation of this record is the provider's dictation and/or activities during the visit.    Electronically signed by: Ernest Mallick, OA  11.20.19 11:00 AM    Gardiner Sleeper, M.D., Ph.D. Diseases & Surgery of the Retina and Vitreous [facility]   I have reviewed the above documentation for accuracy and completeness, and I agree with the above. Gardiner Sleeper, M.D., Ph.D. 01/17/18 11:00 AM   Abbreviations: M myopia (nearsighted); A astigmatism; H hyperopia (farsighted); P presbyopia; Mrx spectacle prescription;  CTL contact lenses; OD right eye; OS left eye; OU both eyes  XT exotropia; ET esotropia; PEK punctate epithelial keratitis; PEE punctate epithelial erosions; DES dry eye syndrome; MGD meibomian gland dysfunction; ATs artificial tears; PFAT's preservative free artificial tears; [place] nuclear sclerotic cataract; PSC posterior subcapsular cataract; ERM epi-retinal membrane; PVD posterior vitreous detachment; RD retinal detachment; DM diabetes mellitus; DR diabetic retinopathy; NPDR non-proliferative diabetic retinopathy; PDR proliferative diabetic retinopathy; CSME clinically significant macular edema;  DME diabetic macular edema; dbh dot blot hemorrhages; CWS cotton wool spot; POAG primary open angle glaucoma; C/D cup-to-disc ratio; HVF humphrey visual field; GVF goldmann visual field; OCT optical coherence tomography; IOP intraocular pressure; BRVO Branch retinal vein occlusion; CRVO central retinal vein occlusion; CRAO central retinal artery occlusion; BRAO branch retinal artery occlusion; RT retinal tear; SB scleral buckle; PPV pars plana vitrectomy; VH Vitreous hemorrhage; PRP panretinal laser photocoagulation; IVK intravitreal kenalog; VMT vitreomacular traction; MH Macular hole;  NVD neovascularization of the disc; NVE neovascularization elsewhere; AREDS age related eye disease study; ARMD age related macular degeneration; POAG primary open angle glaucoma; EBMD epithelial/anterior basement membrane dystrophy; ACIOL anterior chamber intraocular lens; IOL intraocular lens; PCIOL posterior chamber intraocular lens; Phaco/IOL phacoemulsification with intraocular lens placement; [place] photorefractive keratectomy; LASIK laser assisted in situ keratomileusis; HTN hypertension; DM diabetes mellitus; COPD chronic obstructive pulmonary disease

## 2018-01-17 ENCOUNTER — Ambulatory Visit (INDEPENDENT_AMBULATORY_CARE_PROVIDER_SITE_OTHER): Payer: BLUE CROSS/BLUE SHIELD | Admitting: Ophthalmology

## 2018-01-17 ENCOUNTER — Encounter (INDEPENDENT_AMBULATORY_CARE_PROVIDER_SITE_OTHER): Payer: Self-pay | Admitting: Ophthalmology

## 2018-01-17 DIAGNOSIS — H4311 Vitreous hemorrhage, right eye: Secondary | ICD-10-CM | POA: Diagnosis not present

## 2018-01-17 DIAGNOSIS — H35033 Hypertensive retinopathy, bilateral: Secondary | ICD-10-CM

## 2018-01-17 DIAGNOSIS — H25813 Combined forms of age-related cataract, bilateral: Secondary | ICD-10-CM

## 2018-01-17 DIAGNOSIS — E113513 Type 2 diabetes mellitus with proliferative diabetic retinopathy with macular edema, bilateral: Secondary | ICD-10-CM | POA: Diagnosis not present

## 2018-01-17 DIAGNOSIS — I10 Essential (primary) hypertension: Secondary | ICD-10-CM

## 2018-01-17 DIAGNOSIS — H3581 Retinal edema: Secondary | ICD-10-CM

## 2018-02-04 ENCOUNTER — Encounter (INDEPENDENT_AMBULATORY_CARE_PROVIDER_SITE_OTHER): Payer: BLUE CROSS/BLUE SHIELD | Admitting: Ophthalmology

## 2018-02-08 ENCOUNTER — Encounter (INDEPENDENT_AMBULATORY_CARE_PROVIDER_SITE_OTHER): Payer: BLUE CROSS/BLUE SHIELD | Admitting: Ophthalmology
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
## 2018-02-11 ENCOUNTER — Encounter (INDEPENDENT_AMBULATORY_CARE_PROVIDER_SITE_OTHER): Payer: Self-pay | Admitting: Ophthalmology

## 2018-02-11 ENCOUNTER — Ambulatory Visit (INDEPENDENT_AMBULATORY_CARE_PROVIDER_SITE_OTHER): Payer: BLUE CROSS/BLUE SHIELD | Admitting: Ophthalmology

## 2018-02-11 DIAGNOSIS — H3581 Retinal edema: Secondary | ICD-10-CM | POA: Diagnosis not present

## 2018-02-11 DIAGNOSIS — H4311 Vitreous hemorrhage, right eye: Secondary | ICD-10-CM | POA: Diagnosis not present

## 2018-02-11 DIAGNOSIS — E113513 Type 2 diabetes mellitus with proliferative diabetic retinopathy with macular edema, bilateral: Secondary | ICD-10-CM | POA: Diagnosis not present

## 2018-02-11 DIAGNOSIS — I10 Essential (primary) hypertension: Secondary | ICD-10-CM

## 2018-02-11 DIAGNOSIS — H35033 Hypertensive retinopathy, bilateral: Secondary | ICD-10-CM

## 2018-02-11 DIAGNOSIS — H25813 Combined forms of age-related cataract, bilateral: Secondary | ICD-10-CM

## 2018-02-11 MED ORDER — BEVACIZUMAB CHEMO INJECTION 1.25MG/0.05ML SYRINGE FOR KALEIDOSCOPE
1.2500 mg | INTRAVITREAL | Status: DC
  Administered 2018-02-11: 1.25 mg via INTRAVITREAL

## 2018-02-12 ENCOUNTER — Encounter (INDEPENDENT_AMBULATORY_CARE_PROVIDER_SITE_OTHER): Payer: Self-pay | Admitting: Ophthalmology

## 2018-03-01 ENCOUNTER — Telehealth (INDEPENDENT_AMBULATORY_CARE_PROVIDER_SITE_OTHER): Payer: Self-pay

## 2018-03-03 ENCOUNTER — Encounter: Payer: Self-pay | Admitting: Family Medicine

## 2018-03-03 NOTE — Progress Notes (Addendum)
[facility] Note  03/04/2018     CHIEF COMPLAINT Patient presents for Retina Follow Up   HISTORY OF PRESENT ILLNESS: Gloria Gomez is a 48 y.o. female who presents to the clinic today for:   HPI    Retina Follow Up    Patient presents with  Diabetic Retinopathy.  In both eyes.  Since onset it is gradually improving.  I, the attending physician,  performed the HPI with the patient and updated documentation appropriately.          Comments    F/U VH OD. Patient states she has a decrease/ foggy  Vision OD since last ov, this morning vision "has improved slightly".       Last edited by Bernarda Caffey, MD on 03/06/2018 12:39 AM. (History)    pt states she put off calling last week for decreased vision, she states she tried to do modified bedrest and limited movements, but decided when it didn't get better she needed to call, she states it was around the week of Christmas when she first noticed her vision getting hazier, pt states she is on Losartan for BP, but does not check her BP at home, pt states she has felt flushed over the past couple of weeks, pt states her blood pressure runs "borderline" on a regular basis, pts husband states they have had some stressful life events over the past couple of days, he also states her vision seemed to clear up some this morning  Referring physician: Tammi Sou, MD [address] [place], [place] [address]  HISTORICAL INFORMATION:   Selected notes from the MEDICAL RECORD NUMBER Referred for DM exam LEE:  Ocular Hx-NPDR PMH-DM (takes invokana and metformin), high cholesterol, benign brain tumor, white coat HTN    CURRENT MEDICATIONS: No current outpatient medications on file. (Ophthalmic Drugs)   No current facility-administered medications for this visit.  (Ophthalmic Drugs)   Current Outpatient Medications (Other)  Medication Sig  . glipiZIDE (GLUCOTROL XL) 10 MG 24 hr tablet TAKE 1 TABLET BY MOUTH  WITH BREAKFAST  . HYDROcodone-acetaminophen (NORCO/VICODIN) 5-325 MG tablet Take 1-2 tablets by mouth every 6 (six) hours as needed for moderate pain.  . INVOKANA 300 MG TABS tablet TAKE 1 TABLET BY MOUTH ONCE DAILY BEFORE  BREAKFAST  . losartan (COZAAR) 50 MG tablet Take 1 tablet (50 mg total) by mouth daily.  . metFORMIN (GLUCOPHAGE-XR) 500 MG 24 hr tablet TAKE 2 TABLETS BY MOUTH TWICE DAILY  . pantoprazole (PROTONIX) 40 MG tablet TAKE 1 TABLET BY MOUTH ONCE DAILY  . promethazine (PHENERGAN) 12.5 MG tablet 1-2 tabs po q6h prn nausea  . temazepam (RESTORIL) 15 MG capsule Take 1 capsule (15 mg total) by mouth at bedtime as needed for sleep.   Current Facility-Administered Medications (Other)  Medication Route  . Bevacizumab (AVASTIN) SOLN 1.25 mg Intravitreal  . Bevacizumab (AVASTIN) SOLN 1.25 mg Intravitreal  . Bevacizumab (AVASTIN) SOLN 1.25 mg Intravitreal  . Bevacizumab (AVASTIN) SOLN 1.25 mg Intravitreal  . Bevacizumab (AVASTIN) SOLN 1.25 mg Intravitreal  . Bevacizumab (AVASTIN) SOLN 1.25 mg Intravitreal  . Bevacizumab (AVASTIN) SOLN 1.25 mg Intravitreal  . Bevacizumab (AVASTIN) SOLN 1.25 mg Intravitreal      REVIEW OF SYSTEMS: ROS    Positive for: Eyes   Negative for: Constitutional, Gastrointestinal, Neurological, Skin, Genitourinary, Musculoskeletal, HENT, Endocrine, Cardiovascular, Respiratory, Psychiatric, Allergic/Imm, Heme/Lymph   Last edited by Bernarda Caffey, MD on 03/04/2018  2:10 PM. (History)  ALLERGIES Allergies  Allergen Reactions  . Gluten Meal Swelling  . Lisinopril Cough  . Pioglitazone Other (See Comments)    ELEVATED glucoses + worse chronic nausea    PAST MEDICAL HISTORY Past Medical History:  Diagnosis Date  . Abdominal bloating    likely from diab gastroparesis.  Dr. Havery Moros to do EGD as of 10/2017 GI eval.  . Benign brain tumor ([place])    Cystic lesion in cerebral aqueduct region with mild hydrocephalus-- stable MRI 02/2016.  Surveillance  MRI 05/2017 --dilated cerebral aqueduct related to aqueductal stenosis and subsequent mild hydrocephalus (due to the 11 mm stable cystic lesion in cerebral aqueduct---?congenitial?.  . Gluten intolerance    pt reports she underwent full GI w/u to r/o celiac dz  . Hepatic steatosis    ultrasound 08/2017  . Hyperlipidemia, mixed   . Hypertensive retinopathy of both eyes   . Proliferative diabetic retinopathy of both eyes ([place])    steroid injections 10/2017--improved  . Sensorineural hearing loss of left ear    Sudden left hearing loss summer 2016--no improvement with steroids 01/2015 so brain MRI done by Dr. Redmond Baseman and it showed brain tumor that was determined to be benign.  Pt's hearing not bad enough for hearing aid as of 06/2016.  . Type 2 diabetes with complication ([place])    diab retpthy, diabetic gastroparesis (gastric emptying study mildly abnl 03/2017)  . White coat hypertension    Past Surgical History:  Procedure Laterality Date  . CHOLECYSTECTOMY  2000  . GASTRIC EMPTYING SCAN  04/20/2017   Mildly abnormal, particularly the 1st hour of emptying.    FAMILY HISTORY Family History  Problem Relation Age of Onset  . Brain cancer Mother   . Diabetes Father   . Diabetes Maternal Grandmother   . Cataracts Maternal Grandmother   . Cervical cancer Paternal Grandmother   . Colon cancer Maternal Grandfather 13  . Amblyopia Neg Hx   . Blindness Neg Hx   . Glaucoma Neg Hx   . Macular degeneration Neg Hx   . Retinal detachment Neg Hx   . Strabismus Neg Hx   . Retinitis pigmentosa Neg Hx     SOCIAL HISTORY Social History   Tobacco Use  . Smoking status: Never Smoker  . Smokeless tobacco: Never Used  Substance Use Topics  . Alcohol use: No  . Drug use: No         OPHTHALMIC EXAM:  Base Eye Exam    Visual Acuity (Snellen - Linear)      Right Left   Dist cc 20/60 -1 20/25   Dist ph cc NI NI   Correction:  Glasses       Tonometry (Tonopen, 1:13 PM)      Right Left    Pressure 16 14       Pupils      Dark Light Shape React APD   Right 3 2 Round Brisk None   Left 3 2 Round Brisk None       Visual Fields (Counting fingers)      Left Right    Full    Restrictions  Partial outer superior temporal deficiency       Extraocular Movement      Right Left    Ortho Ortho    -- -- --  --  --  -- -- --   -- -- --  --  --  -- -- --         Neuro/Psych  Oriented x3:  Yes   Mood/Affect:  Normal       Dilation    Both eyes:  1.0% Mydriacyl, 2.5% Phenylephrine @ 1:47        Slit Lamp and Fundus Exam    Slit Lamp Exam      Right Left   Lids/Lashes Dermatochalasis - upper lid, Meibomian gland dysfunction, Telangiectasia Dermatochalasis - upper lid, Meibomian gland dysfunction, Telangiectasia   Conjunctiva/Sclera White and quiet White and quiet   Cornea 1+ Punctate epithelial erosions Clear   Anterior Chamber Deep and quiet Deep and quiet   Iris Round and dilated, No NVI Round and dilated, No NVI   Lens 2+ Nuclear sclerosis, 2+ Cortical cataract 2+ Nuclear sclerosis, 2+ Cortical cataract   Vitreous Vitreous syneresis, 3-4+RBC's in anterior vitreous, recurrent/worsening of VH; red blood clot IN Vitreous syneresis, mild old VH inferiorly       Fundus Exam      Right Left   Disc Hazy view, no details visible Regressed NVE and fibrosis   C/D Ratio 0.2 0.1   Macula Hazy view, no details visible Blunted foveal reflex, RPE mottling, cluster Microaneurysms and Exudates temporal to fovea, scattered MA's   Vessels Vascular attenuation, regressing NV Vascular attenuation, mild Tortuousity   Periphery blood clots inferiorly, hazy view, grossly attached, red reflex Attached, scattered DBH, good 360 PRP laser changes          IMAGING AND PROCEDURES  Imaging and Procedures for @TODAY @  OCT, Retina - OU - Both Eyes       Right Eye Quality was borderline. Progression has worsened. Findings include intraretinal fluid, no SRF, epiretinal membrane,  normal foveal contour, intraretinal hyper-reflective material (Only able to obtain single line scan, no thickness).   Left Eye Quality was good. Central Foveal Thickness: 298. Progression has been stable. Findings include intraretinal fluid, no SRF, normal foveal contour, intraretinal hyper-reflective material (Mild persistent IRF -- temporal macula).   Notes *Images captured and stored on drive  Diagnosis / Impression:  OD: recurrent VH, mild persistent DME, mild ERM -- Only able to obtain single line scan, no thickness OS: abnormal foveal contour; +IRF; preretinal fibrosis about the disc; +DME improved  Clinical management:  See below  Abbreviations: NFP - Normal foveal profile. CME - cystoid macular edema. PED - pigment epithelial detachment. IRF - intraretinal fluid. SRF - subretinal fluid. EZ - ellipsoid zone. ERM - epiretinal membrane. ORA - outer retinal atrophy. ORT - outer retinal tubulation. SRHM - subretinal hyper-reflective material                  ASSESSMENT/PLAN:    ICD-10-CM   1. Vitreous hemorrhage of right eye (HCC) H43.11   2. Proliferative diabetic retinopathy of both eyes with macular edema associated with type 2 diabetes mellitus ([place]) X91.4782   3. Retinal edema H35.81 OCT, Retina - OU - Both Eyes  4. Hypertensive retinopathy of both eyes H35.033   5. Essential hypertension I10   6. Combined forms of age-related cataract of both eyes H25.813     1. Vitreous Hemorrhage OD - worse today - rebleed last week and presents today for early f/u due to worsening vision - of note, pt states this morning was first time she noticed improvement post-rebleed - secondary to PDR as described below - S/P IVA OD #1 (09.20.19), #2 (10.25.19), #3 (11.15.19), #4 (12.16.19) - S/P PRP OD #1 (09.27.19), fill in OD (11.21.19) -- each somewhat limited by residual VH - today worse with  mild rebleed  - BCVA 20/60- today from 20/20 - too early for repeat IVA - f/u 03/12/17 for  DFE/OCT, likely IVA OD  2,3. Proliferative diabetic retinopathy w/ DME, OU - s/p IVA OD #1 9.20.19, #2 (10.25.19), #3 (11.15.19), #4 (12.17.19) - s/p IVA OS #1 9.27.19, #2 (10.25.19), #3 (11.15.19), #4 (12.17.19) - today, OD with rebleed, OS doing well -- VA remains 20/20 - recommend IVA OD #5 and OS #5 today, 01.06.20 - RBA of procedure discussed, questions answered - informed consent obtained and signed - see procedure note - HbA1c 7.7% (10.2.19) from 10.3% 3 mos ago (6.26.19) - FA (9.20.19) shows +NVE OU and leaking MA and capillary nonperfusion - repeat FA 11.15.19 shows NV regressing OU - OCT confirms diabetic macular edema OU -- improving - S/P PRP OS (09.20.19) - S/P PRP OD (9.27.19 and 11.21.19) - inferior periphery obscured by [facility] - f/u January 14th as scheduled, DFE/OCT/possible injection OU and focal laser OS  4,5. Hypertensive retinopathy OU - discussed importance of tight BP control. - monitor for now  6. Combined form age related cataract OU-  - The symptoms of cataract, surgical options, and treatments and risks were discussed with patient. - discussed diagnosis and progression - not yet visually significant - monitor for now   Ophthalmic Meds Ordered this visit:  No orders of the defined types were placed in this encounter.      Return for Jan 14, VH OD/NPDR OU, DFE, OCT.  There are no Patient Instructions on file for this visit.   Explained the diagnoses, plan, and follow up with the patient and they expressed understanding.  Patient expressed understanding of the importance of proper follow up care.   This document serves as a record of services personally performed by Gardiner Sleeper, MD, PhD. It was created on their behalf by Ernest Mallick, OA, an ophthalmic assistant. The creation of this record is the provider's dictation and/or activities during the visit.    Electronically signed by: Ernest Mallick, OA  01.05.2020 12:50 AM     Gardiner Sleeper, M.D.,  Ph.D. Diseases & Surgery of the Retina and Vitreous [facility]  I have reviewed the above documentation for accuracy and completeness, and I agree with the above. Gardiner Sleeper, M.D., Ph.D. 03/06/18 12:50 AM     Abbreviations: M myopia (nearsighted); A astigmatism; H hyperopia (farsighted); P presbyopia; Mrx spectacle prescription;  CTL contact lenses; OD right eye; OS left eye; OU both eyes  XT exotropia; ET esotropia; PEK punctate epithelial keratitis; PEE punctate epithelial erosions; DES dry eye syndrome; MGD meibomian gland dysfunction; ATs artificial tears; PFAT's preservative free artificial tears; [place] nuclear sclerotic cataract; PSC posterior subcapsular cataract; ERM epi-retinal membrane; PVD posterior vitreous detachment; RD retinal detachment; DM diabetes mellitus; DR diabetic retinopathy; NPDR non-proliferative diabetic retinopathy; PDR proliferative diabetic retinopathy; CSME clinically significant macular edema; DME diabetic macular edema; dbh dot blot hemorrhages; CWS cotton wool spot; POAG primary open angle glaucoma; C/D cup-to-disc ratio; HVF humphrey visual field; GVF goldmann visual field; OCT optical coherence tomography; IOP intraocular pressure; BRVO Branch retinal vein occlusion; CRVO central retinal vein occlusion; CRAO central retinal artery occlusion; BRAO branch retinal artery occlusion; RT retinal tear; SB scleral buckle; PPV pars plana vitrectomy; VH Vitreous hemorrhage; PRP panretinal laser photocoagulation; IVK intravitreal kenalog; VMT vitreomacular traction; MH Macular hole;  NVD neovascularization of the disc; NVE neovascularization elsewhere; AREDS age related eye disease study; ARMD age related macular degeneration; POAG primary open angle  glaucoma; EBMD epithelial/anterior basement membrane dystrophy; ACIOL anterior chamber intraocular lens; IOL intraocular lens; PCIOL posterior chamber intraocular lens; Phaco/IOL phacoemulsification with  intraocular lens placement; [place] photorefractive keratectomy; LASIK laser assisted in situ keratomileusis; HTN hypertension; DM diabetes mellitus; COPD chronic obstructive pulmonary disease

## 2018-03-04 ENCOUNTER — Encounter (INDEPENDENT_AMBULATORY_CARE_PROVIDER_SITE_OTHER): Payer: Self-pay | Admitting: Ophthalmology

## 2018-03-04 ENCOUNTER — Ambulatory Visit (INDEPENDENT_AMBULATORY_CARE_PROVIDER_SITE_OTHER): Payer: BLUE CROSS/BLUE SHIELD | Admitting: Ophthalmology

## 2018-03-04 DIAGNOSIS — I10 Essential (primary) hypertension: Secondary | ICD-10-CM

## 2018-03-04 DIAGNOSIS — H35033 Hypertensive retinopathy, bilateral: Secondary | ICD-10-CM | POA: Diagnosis not present

## 2018-03-04 DIAGNOSIS — H4311 Vitreous hemorrhage, right eye: Secondary | ICD-10-CM

## 2018-03-04 DIAGNOSIS — E113513 Type 2 diabetes mellitus with proliferative diabetic retinopathy with macular edema, bilateral: Secondary | ICD-10-CM | POA: Diagnosis not present

## 2018-03-04 DIAGNOSIS — H3581 Retinal edema: Secondary | ICD-10-CM | POA: Diagnosis not present

## 2018-03-04 DIAGNOSIS — H25813 Combined forms of age-related cataract, bilateral: Secondary | ICD-10-CM

## 2018-03-06 ENCOUNTER — Encounter (INDEPENDENT_AMBULATORY_CARE_PROVIDER_SITE_OTHER): Payer: Self-pay | Admitting: Ophthalmology

## 2018-03-09 NOTE — Progress Notes (Signed)
[facility] Note  03/12/2018     CHIEF COMPLAINT Patient presents for Retina Follow Up   HISTORY OF PRESENT ILLNESS: Gloria Gomez is a 48 y.o. female who presents to the clinic today for:   HPI    Retina Follow Up    In left eye.  This started 3 months ago.  Severity is mild.  Since onset it is stable.  I, the attending physician,  performed the HPI with the patient and updated documentation appropriately.          Comments    F/U VH OD. Patient states she has rested a lot since last OV, her vision "seems to be better" per patient. Pt is ready for tx's today if indicted.       Last edited by Bernarda Caffey, MD on 03/12/2018  1:18 PM. (History)    pt states she is doing better today, she states she took it easy over the past couple of weeks  Referring physician: Tammi Sou, MD [id number] [place], [place] [address]  HISTORICAL INFORMATION:   Selected notes from the MEDICAL RECORD NUMBER Referred for DM exam LEE:  Ocular Hx-NPDR PMH-DM (takes invokana and metformin), high cholesterol, benign brain tumor, white coat HTN    CURRENT MEDICATIONS: No current outpatient medications on file. (Ophthalmic Drugs)   No current facility-administered medications for this visit.  (Ophthalmic Drugs)   Current Outpatient Medications (Other)  Medication Sig  . glipiZIDE (GLUCOTROL XL) 10 MG 24 hr tablet TAKE 1 TABLET BY MOUTH WITH BREAKFAST  . HYDROcodone-acetaminophen (NORCO/VICODIN) 5-325 MG tablet Take 1-2 tablets by mouth every 6 (six) hours as needed for moderate pain.  . INVOKANA 300 MG TABS tablet TAKE 1 TABLET BY MOUTH ONCE DAILY BEFORE  BREAKFAST  . losartan (COZAAR) 50 MG tablet Take 1 tablet (50 mg total) by mouth daily.  . metFORMIN (GLUCOPHAGE-XR) 500 MG 24 hr tablet TAKE 2 TABLETS BY MOUTH TWICE DAILY  . pantoprazole (PROTONIX) 40 MG tablet TAKE 1 TABLET BY MOUTH ONCE DAILY  . promethazine (PHENERGAN) 12.5 MG tablet 1-2 tabs po  q6h prn nausea  . temazepam (RESTORIL) 15 MG capsule Take 1 capsule (15 mg total) by mouth at bedtime as needed for sleep.   Current Facility-Administered Medications (Other)  Medication Route  . Bevacizumab (AVASTIN) SOLN 1.25 mg Intravitreal  . Bevacizumab (AVASTIN) SOLN 1.25 mg Intravitreal  . Bevacizumab (AVASTIN) SOLN 1.25 mg Intravitreal  . Bevacizumab (AVASTIN) SOLN 1.25 mg Intravitreal  . Bevacizumab (AVASTIN) SOLN 1.25 mg Intravitreal  . Bevacizumab (AVASTIN) SOLN 1.25 mg Intravitreal  . Bevacizumab (AVASTIN) SOLN 1.25 mg Intravitreal  . Bevacizumab (AVASTIN) SOLN 1.25 mg Intravitreal  . Bevacizumab (AVASTIN) SOLN 1.25 mg Intravitreal  . Bevacizumab (AVASTIN) SOLN 1.25 mg Intravitreal      REVIEW OF SYSTEMS: ROS    Positive for: Eyes   Negative for: Constitutional, Gastrointestinal, Neurological, Skin, Genitourinary, Musculoskeletal, HENT, Endocrine, Cardiovascular, Respiratory, Psychiatric, Allergic/Imm, Heme/Lymph   Last edited by Zenovia Jordan, LPN on 0/26/3785  8:85 PM. (History)       ALLERGIES Allergies  Allergen Reactions  . Gluten Meal Swelling  . Lisinopril Cough  . Pioglitazone Other (See Comments)    ELEVATED glucoses + worse chronic nausea    PAST MEDICAL HISTORY Past Medical History:  Diagnosis Date  . Abdominal bloating    likely from diab gastroparesis.  Dr. Havery Moros to do EGD as of 10/2017 GI eval.  . Benign brain tumor (  [place])    Cystic lesion in cerebral aqueduct region with mild hydrocephalus-- stable MRI 02/2016.  Surveillance MRI 05/2017 --dilated cerebral aqueduct related to aqueductal stenosis and subsequent mild hydrocephalus (due to the 11 mm stable cystic lesion in cerebral aqueduct---?congenitial?.  . Gluten intolerance    pt reports she underwent full GI w/u to r/o celiac dz  . Hepatic steatosis    ultrasound 08/2017  . Hyperlipidemia, mixed   . Hypertensive retinopathy of both eyes   . Proliferative diabetic retinopathy of both  eyes ([place])    steroid injections 10/2017--improved  . Sensorineural hearing loss of left ear    Sudden left hearing loss summer 2016--no improvement with steroids 01/2015 so brain MRI done by Dr. Redmond Baseman and it showed brain tumor that was determined to be benign.  Pt's hearing not bad enough for hearing aid as of 06/2016.  . Type 2 diabetes with complication ([place])    diab retpthy, diabetic gastroparesis (gastric emptying study mildly abnl 03/2017)  . White coat hypertension    Past Surgical History:  Procedure Laterality Date  . CHOLECYSTECTOMY  2000  . GASTRIC EMPTYING SCAN  04/20/2017   Mildly abnormal, particularly the 1st hour of emptying.    FAMILY HISTORY Family History  Problem Relation Age of Onset  . Brain cancer Mother   . Diabetes Father   . Diabetes Maternal Grandmother   . Cataracts Maternal Grandmother   . Cervical cancer Paternal Grandmother   . Colon cancer Maternal Grandfather 74  . Amblyopia Neg Hx   . Blindness Neg Hx   . Glaucoma Neg Hx   . Macular degeneration Neg Hx   . Retinal detachment Neg Hx   . Strabismus Neg Hx   . Retinitis pigmentosa Neg Hx     SOCIAL HISTORY Social History   Tobacco Use  . Smoking status: Never Smoker  . Smokeless tobacco: Never Used  Substance Use Topics  . Alcohol use: No  . Drug use: No         OPHTHALMIC EXAM:  Base Eye Exam    Visual Acuity (Snellen - Linear)      Right Left   Dist cc 20/30 -2 20/30   Dist ph cc NI 20/25   Correction:  Glasses       Tonometry (Tonopen, 1:09 PM)      Right Left   Pressure 12 14       Pupils      Dark Light Shape React APD   Right 3 2 Round Brisk None   Left 3 2 Round Brisk None       Visual Fields (Counting fingers)      Left Right    Full Full       Extraocular Movement      Right Left    Full, Ortho Full, Ortho       Neuro/Psych    Oriented x3:  Yes   Mood/Affect:  Normal       Dilation    Both eyes:  1.0% Mydriacyl, 2.5% Phenylephrine @ 1:09 PM         Slit Lamp and Fundus Exam    Slit Lamp Exam      Right Left   Lids/Lashes Dermatochalasis - upper lid, Meibomian gland dysfunction, Telangiectasia Dermatochalasis - upper lid, Meibomian gland dysfunction, Telangiectasia   Conjunctiva/Sclera White and quiet White and quiet   Cornea 1+ Punctate epithelial erosions Clear   Anterior Chamber Deep and quiet Deep and quiet   Iris  Round and dilated, No NVI Round and dilated, No NVI   Lens 2+ Nuclear sclerosis, 2+ Cortical cataract 2+ Nuclear sclerosis, 2+ Cortical cataract   Vitreous Vitreous syneresis, 3-4+RBC's in anterior vitreous, recurrent/worsening of VH; red blood clot IN Vitreous syneresis, mild old VH inferiorly       Fundus Exam      Right Left   Disc Hazy view, perfused, ?fine NVD Regressed NVE and fibrosis   C/D Ratio 0.2 0.1   Macula Hazy view, grossly flat, no details visible Blunted foveal reflex, Exudates and MA's nasal and temporal to fovea   Vessels Hazy view, Vascular attenuation, Tortuous Vascular attenuation, mild Tortuousity   Periphery Hazy view, Attached, 360 PRP scars Attached, scattered DBH, good 360 PRP laser changes          IMAGING AND PROCEDURES  Imaging and Procedures for @TODAY @  OCT, Retina - OU - Both Eyes       Right Eye Quality was poor. Central Foveal Thickness: 324. Progression has improved. Findings include intraretinal fluid, no SRF, epiretinal membrane, normal foveal contour, intraretinal hyper-reflective material, vitreous traction (Vitreous opacitites).   Left Eye Quality was good. Central Foveal Thickness: 300. Progression has been stable. Findings include intraretinal fluid, no SRF, normal foveal contour, intraretinal hyper-reflective material, epiretinal membrane (Mild persistent IRF -- temporal macula).   Notes *Images captured and stored on drive  Diagnosis / Impression:  OD: recurrent VH, mild persistent DME, mild ERM --  OS: abnormal foveal contour; +IRF; preretinal fibrosis  about the disc; stable/persistent  Clinical management:  See below  Abbreviations: NFP - Normal foveal profile. CME - cystoid macular edema. PED - pigment epithelial detachment. IRF - intraretinal fluid. SRF - subretinal fluid. EZ - ellipsoid zone. ERM - epiretinal membrane. ORA - outer retinal atrophy. ORT - outer retinal tubulation. SRHM - subretinal hyper-reflective material         Intravitreal Injection, Pharmacologic Agent - OD - Right Eye       Time Out 03/12/2018. 2:08 PM. Confirmed correct patient, procedure, site, and patient consented.   Anesthesia Topical anesthesia was used. Anesthetic medications included Lidocaine 2%, Proparacaine 0.5%.   Procedure Preparation included 5% betadine to ocular surface, eyelid speculum. A supplied needle was used.   Injection:  1.25 mg Bevacizumab (AVASTIN) SOLN   NDC: 77824-235-36, Lot: [id number] , Expiration date: 04/19/2018   Route: Intravitreal, Site: Right Eye, Waste: 0 mL  Post-op Post injection exam found visual acuity of at least counting fingers. The patient tolerated the procedure well. There were no complications. The patient received written and verbal post procedure care education.        Intravitreal Injection, Pharmacologic Agent - OS - Left Eye       Time Out 03/12/2018. 2:06 PM. Confirmed correct patient, procedure, site, and patient consented.   Anesthesia Topical anesthesia was used. Anesthetic medications included Lidocaine 2%, Proparacaine 0.5%.   Procedure Preparation included 5% betadine to ocular surface, eyelid speculum. A 30 gauge needle was used.   Injection:  1.25 mg Bevacizumab (AVASTIN) SOLN   NDC: 03/25/2018, Lot: [id number] , Expiration date: 04/09/2018   Route: Intravitreal, Site: Left Eye, Waste: 0 mL  Post-op Post injection exam found visual acuity of at least counting fingers. The patient tolerated the procedure well. There were no complications. The patient received written and  verbal post procedure care education.                 ASSESSMENT/PLAN:    ICD-10-CM   1. Vitreous hemorrhage  of right eye ([place]) H43.11 Intravitreal Injection, Pharmacologic Agent - OD - Right Eye    Bevacizumab (AVASTIN) SOLN 1.25 mg  2. Proliferative diabetic retinopathy of both eyes with macular edema associated with type 2 diabetes mellitus ([facility]) O24.2353 Intravitreal Injection, Pharmacologic Agent - OD - Right Eye    Intravitreal Injection, Pharmacologic Agent - OS - Left Eye    Bevacizumab (AVASTIN) SOLN 1.25 mg    Bevacizumab (AVASTIN) SOLN 1.25 mg  3. Retinal edema H35.81 OCT, Retina - OU - Both Eyes  4. Hypertensive retinopathy of both eyes H35.033   5. Essential hypertension I10   6. Combined forms of age-related cataract of both eyes H25.813     1. Vitreous Hemorrhage OD - improving today - secondary to PDR as described below - S/P IVA OD #1 (09.20.19), #2 (10.25.19), #3 (11.15.19), #4 (12.16.19) - S/P PRP OD #1 (09.27.19), fill in OD (11.21.19) -- each somewhat limited by residual VH - BCVA 20/30-2 today from 20/60 - recommend IVA OD #5 today - f/u 2 weeks  2,3. Proliferative diabetic retinopathy w/ DME, OU - s/p IVA OD #1 9.20.19, #2 (10.25.19), #3 (11.15.19), #4 (12.17.19) - s/p IVA OS #1 9.27.19, #2 (10.25.19), #3 (11.15.19), #4 (12.17.19) - today, OD with improved VH from prior visit, OS doing well -- VA stable at 20/25 - recommend IVA OD #5 and OS #5 today, 01.14.20 - RBA of procedure discussed, questions answered - informed consent obtained and signed - see procedure note - HbA1c 7.7% (10.2.19) from 10.3% 3 mos ago (6.26.19) - FA (9.20.19) shows +NVE OU and leaking MA and capillary nonperfusion - repeat FA 11.15.19 shows NV regressing OU - OCT confirms diabetic macular edema OU -- improving - S/P PRP OS (09.20.19) - S/P PRP OD (9.27.19 and 11.21.19) - inferior periphery obscured by [facility] - f/u 2 weeks, possible laser OS  4,5. Hypertensive retinopathy  OU - discussed importance of tight BP control. - monitor for now  6. Combined form age related cataract OU-  - The symptoms of cataract, surgical options, and treatments and risks were discussed with patient. - discussed diagnosis and progression - not yet visually significant - monitor for now   Ophthalmic Meds Ordered this visit:  Meds ordered this encounter  Medications  . Bevacizumab (AVASTIN) SOLN 1.25 mg  . Bevacizumab (AVASTIN) SOLN 1.25 mg       Return in about 2 weeks (around 03/26/2018) for VH/NPDR, DFE, OCT, possible laser OS.  There are no Patient Instructions on file for this visit.   Explained the diagnoses, plan, and follow up with the patient and they expressed understanding.  Patient expressed understanding of the importance of proper follow up care.   This document serves as a record of services personally performed by Gardiner Sleeper, MD, PhD. It was created on their behalf by Ernest Mallick, OA, an ophthalmic assistant. The creation of this record is the provider's dictation and/or activities during the visit.    Electronically signed by: Ernest Mallick, OA  01.11.2020 5:45 PM    Gardiner Sleeper, M.D., Ph.D. Diseases & Surgery of the Retina and Vitreous [facility]  I have reviewed the above documentation for accuracy and completeness, and I agree with the above. Gardiner Sleeper, M.D., Ph.D. 03/16/18 5:51 PM    Abbreviations: M myopia (nearsighted); A astigmatism; H hyperopia (farsighted); P presbyopia; Mrx spectacle prescription;  CTL contact lenses; OD right eye; OS left eye; OU both eyes  XT exotropia; ET  esotropia; PEK punctate epithelial keratitis; PEE punctate epithelial erosions; DES dry eye syndrome; MGD meibomian gland dysfunction; ATs artificial tears; PFAT's preservative free artificial tears; [place] nuclear sclerotic cataract; PSC posterior subcapsular cataract; ERM epi-retinal membrane; PVD posterior vitreous detachment; RD  retinal detachment; DM diabetes mellitus; DR diabetic retinopathy; NPDR non-proliferative diabetic retinopathy; PDR proliferative diabetic retinopathy; CSME clinically significant macular edema; DME diabetic macular edema; dbh dot blot hemorrhages; CWS cotton wool spot; POAG primary open angle glaucoma; C/D cup-to-disc ratio; HVF humphrey visual field; GVF goldmann visual field; OCT optical coherence tomography; IOP intraocular pressure; BRVO Branch retinal vein occlusion; CRVO central retinal vein occlusion; CRAO central retinal artery occlusion; BRAO branch retinal artery occlusion; RT retinal tear; SB scleral buckle; PPV pars plana vitrectomy; VH Vitreous hemorrhage; PRP panretinal laser photocoagulation; IVK intravitreal kenalog; VMT vitreomacular traction; MH Macular hole;  NVD neovascularization of the disc; NVE neovascularization elsewhere; AREDS age related eye disease study; ARMD age related macular degeneration; POAG primary open angle glaucoma; EBMD epithelial/anterior basement membrane dystrophy; ACIOL anterior chamber intraocular lens; IOL intraocular lens; PCIOL posterior chamber intraocular lens; Phaco/IOL phacoemulsification with intraocular lens placement; [place] photorefractive keratectomy; LASIK laser assisted in situ keratomileusis; HTN hypertension; DM diabetes mellitus; COPD chronic obstructive pulmonary disease

## 2018-03-11 ENCOUNTER — Encounter (INDEPENDENT_AMBULATORY_CARE_PROVIDER_SITE_OTHER): Payer: Self-pay | Admitting: Ophthalmology

## 2018-03-12 ENCOUNTER — Encounter (INDEPENDENT_AMBULATORY_CARE_PROVIDER_SITE_OTHER): Payer: BLUE CROSS/BLUE SHIELD | Admitting: Ophthalmology

## 2018-03-12 ENCOUNTER — Encounter (INDEPENDENT_AMBULATORY_CARE_PROVIDER_SITE_OTHER): Payer: Self-pay | Admitting: Ophthalmology

## 2018-03-12 ENCOUNTER — Ambulatory Visit (INDEPENDENT_AMBULATORY_CARE_PROVIDER_SITE_OTHER): Payer: BLUE CROSS/BLUE SHIELD | Admitting: Ophthalmology

## 2018-03-12 DIAGNOSIS — H4311 Vitreous hemorrhage, right eye: Secondary | ICD-10-CM

## 2018-03-12 DIAGNOSIS — H3581 Retinal edema: Secondary | ICD-10-CM | POA: Diagnosis not present

## 2018-03-12 DIAGNOSIS — E113513 Type 2 diabetes mellitus with proliferative diabetic retinopathy with macular edema, bilateral: Secondary | ICD-10-CM

## 2018-03-12 DIAGNOSIS — H35033 Hypertensive retinopathy, bilateral: Secondary | ICD-10-CM | POA: Diagnosis not present

## 2018-03-12 DIAGNOSIS — H25813 Combined forms of age-related cataract, bilateral: Secondary | ICD-10-CM

## 2018-03-12 DIAGNOSIS — I10 Essential (primary) hypertension: Secondary | ICD-10-CM

## 2018-03-12 MED ORDER — BEVACIZUMAB CHEMO INJECTION 1.25MG/0.05ML SYRINGE FOR KALEIDOSCOPE
1.2500 mg | INTRAVITREAL | Status: DC
  Administered 2018-03-12: 1.25 mg via INTRAVITREAL

## 2018-03-16 ENCOUNTER — Encounter (INDEPENDENT_AMBULATORY_CARE_PROVIDER_SITE_OTHER): Payer: Self-pay | Admitting: Ophthalmology

## 2018-03-24 NOTE — Progress Notes (Signed)
[facility] Note  03/26/2018     CHIEF COMPLAINT Patient presents for Retina Follow Up   HISTORY OF PRESENT ILLNESS: Gloria Gomez is a 48 y.o. female who presents to the clinic today for:   HPI    Retina Follow Up    Patient presents with  Diabetic Retinopathy.  In both eyes.  Severity is moderate.  Duration of 2 weeks.  I, the attending physician,  performed the HPI with the patient and updated documentation appropriately.          Comments    Patient states vision the same OU. BS was 147 this am. Last a1c was 7.9 around 3 months ago. BP ran high last week-197/114. BP good last 3 days around 128/88.        Last edited by Bernarda Caffey, MD on 03/26/2018  3:36 PM. (History)    pt states she is doing better today, she states she took it easy over the past couple of weeks  Referring physician: Tammi Sou, MD [address] [place], [place] [address]  HISTORICAL INFORMATION:   Selected notes from the MEDICAL RECORD NUMBER Referred for DM exam LEE:  Ocular Hx-NPDR PMH-DM (takes invokana and metformin), high cholesterol, benign brain tumor, white coat HTN    CURRENT MEDICATIONS: No current outpatient medications on file. (Ophthalmic Drugs)   No current facility-administered medications for this visit.  (Ophthalmic Drugs)   Current Outpatient Medications (Other)  Medication Sig  . glipiZIDE (GLUCOTROL XL) 10 MG 24 hr tablet TAKE 1 TABLET BY MOUTH WITH BREAKFAST  . HYDROcodone-acetaminophen (NORCO/VICODIN) 5-325 MG tablet Take 1-2 tablets by mouth every 6 (six) hours as needed for moderate pain.  . INVOKANA 300 MG TABS tablet TAKE 1 TABLET BY MOUTH ONCE DAILY BEFORE  BREAKFAST  . losartan (COZAAR) 50 MG tablet TAKE 1 TABLET BY MOUTH ONCE DAILY  . metFORMIN (GLUCOPHAGE-XR) 500 MG 24 hr tablet TAKE 2 TABLETS BY MOUTH TWICE DAILY  . pantoprazole (PROTONIX) 40 MG tablet TAKE 1 TABLET BY MOUTH ONCE DAILY  . promethazine (PHENERGAN) 12.5  MG tablet 1-2 tabs po q6h prn nausea  . temazepam (RESTORIL) 15 MG capsule Take 1 capsule (15 mg total) by mouth at bedtime as needed for sleep.   Current Facility-Administered Medications (Other)  Medication Route  . Bevacizumab (AVASTIN) SOLN 1.25 mg Intravitreal  . Bevacizumab (AVASTIN) SOLN 1.25 mg Intravitreal  . Bevacizumab (AVASTIN) SOLN 1.25 mg Intravitreal  . Bevacizumab (AVASTIN) SOLN 1.25 mg Intravitreal  . Bevacizumab (AVASTIN) SOLN 1.25 mg Intravitreal  . Bevacizumab (AVASTIN) SOLN 1.25 mg Intravitreal  . Bevacizumab (AVASTIN) SOLN 1.25 mg Intravitreal  . Bevacizumab (AVASTIN) SOLN 1.25 mg Intravitreal  . Bevacizumab (AVASTIN) SOLN 1.25 mg Intravitreal  . Bevacizumab (AVASTIN) SOLN 1.25 mg Intravitreal      REVIEW OF SYSTEMS: ROS    Positive for: Endocrine, Eyes   Negative for: Constitutional, Gastrointestinal, Neurological, Skin, Genitourinary, Musculoskeletal, HENT, Cardiovascular, Respiratory, Psychiatric, Allergic/Imm, Heme/Lymph   Last edited by Roselee Nova D on 03/26/2018  2:38 PM. (History)       ALLERGIES Allergies  Allergen Reactions  . Gluten Meal Swelling  . Lisinopril Cough  . Pioglitazone Other (See Comments)    ELEVATED glucoses + worse chronic nausea    PAST MEDICAL HISTORY Past Medical History:  Diagnosis Date  . Abdominal bloating    likely from diab gastroparesis.  Dr. Havery Moros to do EGD as of 10/2017 GI eval.  . Benign brain  tumor ([place])    Cystic lesion in cerebral aqueduct region with mild hydrocephalus-- stable MRI 02/2016.  Surveillance MRI 05/2017 --dilated cerebral aqueduct related to aqueductal stenosis and subsequent mild hydrocephalus (due to the 11 mm stable cystic lesion in cerebral aqueduct---?congenitial?.  . Gluten intolerance    pt reports she underwent full GI w/u to r/o celiac dz  . Hepatic steatosis    ultrasound 08/2017  . Hyperlipidemia, mixed   . Hypertensive retinopathy of both eyes   . Proliferative diabetic  retinopathy of both eyes ([place])    steroid injections 10/2017--improved  . Sensorineural hearing loss of left ear    Sudden left hearing loss summer 2016--no improvement with steroids 01/2015 so brain MRI done by Dr. Redmond Baseman and it showed brain tumor that was determined to be benign.  Pt's hearing not bad enough for hearing aid as of 06/2016.  . Type 2 diabetes with complication ([place])    diab retpthy, diabetic gastroparesis (gastric emptying study mildly abnl 03/2017)  . White coat hypertension    Past Surgical History:  Procedure Laterality Date  . CHOLECYSTECTOMY  2000  . GASTRIC EMPTYING SCAN  04/20/2017   Mildly abnormal, particularly the 1st hour of emptying.    FAMILY HISTORY Family History  Problem Relation Age of Onset  . Brain cancer Mother   . Diabetes Father   . Diabetes Maternal Grandmother   . Cataracts Maternal Grandmother   . Cervical cancer Paternal Grandmother   . Colon cancer Maternal Grandfather 75  . Amblyopia Neg Hx   . Blindness Neg Hx   . Glaucoma Neg Hx   . Macular degeneration Neg Hx   . Retinal detachment Neg Hx   . Strabismus Neg Hx   . Retinitis pigmentosa Neg Hx     SOCIAL HISTORY Social History   Tobacco Use  . Smoking status: Never Smoker  . Smokeless tobacco: Never Used  Substance Use Topics  . Alcohol use: No  . Drug use: No         OPHTHALMIC EXAM:  Base Eye Exam    Visual Acuity (Snellen - Linear)      Right Left   Dist cc 20/25 -2 20/25 -2   Dist ph cc NI 20/20 -1   Correction:  Glasses       Tonometry (Tonopen, 2:47 PM)      Right Left   Pressure 15 16       Pupils      Dark Light Shape React APD   Right 3 2 Round Brisk None   Left 3 2 Round Brisk None       Visual Fields (Counting fingers)      Left Right    Full Full  No VF defect noted today. Previous defect OD.       Extraocular Movement      Right Left    Full, Ortho Full, Ortho       Neuro/Psych    Oriented x3:  Yes   Mood/Affect:  Normal        Dilation    Both eyes:  1.0% Mydriacyl, 2.5% Phenylephrine @ 2:48 PM        Slit Lamp and Fundus Exam    Slit Lamp Exam      Right Left   Lids/Lashes Dermatochalasis - upper lid, Meibomian gland dysfunction, Telangiectasia Dermatochalasis - upper lid, Meibomian gland dysfunction, Telangiectasia   Conjunctiva/Sclera White and quiet White and quiet   Cornea 1+ Punctate epithelial erosions Clear  Anterior Chamber Deep and quiet Deep and quiet   Iris Round and dilated, No NVI Round and dilated, No NVI   Lens 2+ Nuclear sclerosis, 2+ Cortical cataract 2+ Nuclear sclerosis, 2+ Cortical cataract   Vitreous Diffuse VH, clearing and settling inferiorly.  Vitreous syneresis, mild old VH inferiorly       Fundus Exam      Right Left   Disc Hazy view, pink and sharp, fine NVD, mild fibrosis with fine NVD, red blood clots inferior Regressed NVE and fibrosis, compact   C/D Ratio 0.1 0.1   Macula Hazy view, grossly flat, no details visible, scattered exudate Blunted foveal reflex, cluster of MA's and exudates temporal to fovea   Vessels Hazy view, Vascular attenuation, Tortuous Vascular attenuation, mild Tortuousity   Periphery Hazy view, Attached, 360 PRP scars superior nasal and temporal Attached, scattered DBH, good 360 PRP laser changes, room for fill in posteriorly.         Refraction    Wearing Rx      Sphere Cylinder   Right -3.75 Sphere   Left -3.75 Sphere          IMAGING AND PROCEDURES  Imaging and Procedures for @TODAY @  OCT, Retina - OU - Both Eyes       Right Eye Quality was poor. Central Foveal Thickness: 347. Progression has improved. Findings include intraretinal fluid, no SRF, epiretinal membrane, normal foveal contour, intraretinal hyper-reflective material, vitreous traction, abnormal foveal contour (Interval improvement in vitreous opacities, mild ERM, interval increase in temporal IRF).   Left Eye Quality was good. Central Foveal Thickness: 298. Progression has  been stable. Findings include intraretinal fluid, no SRF, normal foveal contour, intraretinal hyper-reflective material, epiretinal membrane (Mild persistent cystic changes, mild persistent IRF-scattered).   Notes *Images captured and stored on drive  Diagnosis / Impression:  OD: interval improvement in VH, interval increase in DME/IRF, mild ERM OS: abnormal foveal contour; +IRF; preretinal fibrosis about the disc; stable/persistent, mild persistent cystic changes  Clinical management:  See below  Abbreviations: NFP - Normal foveal profile. CME - cystoid macular edema. PED - pigment epithelial detachment. IRF - intraretinal fluid. SRF - subretinal fluid. EZ - ellipsoid zone. ERM - epiretinal membrane. ORA - outer retinal atrophy. ORT - outer retinal tubulation. SRHM - subretinal hyper-reflective material                  ASSESSMENT/PLAN:    ICD-10-CM   1. Vitreous hemorrhage of right eye (HCC) H43.11   2. Proliferative diabetic retinopathy of both eyes with macular edema associated with type 2 diabetes mellitus ([place]) L46.5035   3. Retinal edema H35.81 OCT, Retina - OU - Both Eyes  4. Hypertensive retinopathy of both eyes H35.033   5. Essential hypertension I10   6. Combined forms of age-related cataract of both eyes H25.813     1. Vitreous Hemorrhage OD -interval improvement - secondary to PDR as described below - S/P IVA OD #1 (09.20.19), #2 (10.25.19), #3 (11.15.19), #4 (12.16.19), #5 (03/12/2018) - S/P PRP OD #1 (09.27.19), fill in OD (11.21.19) -- each somewhat limited by residual VH - BCVA improved to  20/25 today from 20/30-2 - diffuse VH clearing and settling inferiorly - continue monitoring  2,3. Proliferative diabetic retinopathy w/ DME, OU - s/p IVA OD #1 9.20.19, #2 (10.25.19), #3 (11.15.19), #4 (12.17.19), #5 (01.14.20) - s/p IVA OS #1 9.27.19, #2 (10.25.19), #3 (11.15.19), #4 (12.17.19), #5 (01.14.20) - today, OD with improved VH from prior visit, OS doing  well -- VA stable at 20/25 - HbA1c 7.7% (10.2.19) from 10.3% (6.26.19) - FA (9.20.19) shows +NVE OU and leaking MA and capillary nonperfusion - repeat FA 11.15.19 shows NV regressing OU - OCT confirms diabetic macular edema OU -- improving - S/P PRP OS (09.20.19) - S/P PRP OD (9.27.19 and 11.21.19) - inferior periphery obscured by [facility] -- f/u 2 weeks for repeat injection OU, may need fill-in laser in the future   4,5. Hypertensive retinopathy OU - discussed importance of tight BP control. - monitor for now  6. Combined form age related cataract OU-  - The symptoms of cataract, surgical options, and treatments and risks were discussed with patient. - discussed diagnosis and progression - not yet visually significant - monitor for now   Ophthalmic Meds Ordered this visit:  No orders of the defined types were placed in this encounter.      Return 2 weeks, for DFE, OCT.  There are no Patient Instructions on file for this visit.   Explained the diagnoses, plan, and follow up with the patient and they expressed understanding.  Patient expressed understanding of the importance of proper follow up care.   This document serves as a record of services personally performed by Gardiner Sleeper, MD, PhD. It was created on their behalf by Ernest Mallick, OA, an ophthalmic assistant. The creation of this record is the provider's dictation and/or activities during the visit.    Electronically signed by: Ernest Mallick, OA  01.26.2020 1:01 PM    Gardiner Sleeper, M.D., Ph.D. Diseases & Surgery of the Retina and Vitreous [facility]  I have reviewed the above documentation for accuracy and completeness, and I agree with the above. Gardiner Sleeper, M.D., Ph.D. 03/27/18 1:04 PM   Abbreviations: M myopia (nearsighted); A astigmatism; H hyperopia (farsighted); P presbyopia; Mrx spectacle prescription;  CTL contact lenses; OD right eye; OS left eye; OU both eyes  XT exotropia;  ET esotropia; PEK punctate epithelial keratitis; PEE punctate epithelial erosions; DES dry eye syndrome; MGD meibomian gland dysfunction; ATs artificial tears; PFAT's preservative free artificial tears; [place] nuclear sclerotic cataract; PSC posterior subcapsular cataract; ERM epi-retinal membrane; PVD posterior vitreous detachment; RD retinal detachment; DM diabetes mellitus; DR diabetic retinopathy; NPDR non-proliferative diabetic retinopathy; PDR proliferative diabetic retinopathy; CSME clinically significant macular edema; DME diabetic macular edema; dbh dot blot hemorrhages; CWS cotton wool spot; POAG primary open angle glaucoma; C/D cup-to-disc ratio; HVF humphrey visual field; GVF goldmann visual field; OCT optical coherence tomography; IOP intraocular pressure; BRVO Branch retinal vein occlusion; CRVO central retinal vein occlusion; CRAO central retinal artery occlusion; BRAO branch retinal artery occlusion; RT retinal tear; SB scleral buckle; PPV pars plana vitrectomy; VH Vitreous hemorrhage; PRP panretinal laser photocoagulation; IVK intravitreal kenalog; VMT vitreomacular traction; MH Macular hole;  NVD neovascularization of the disc; NVE neovascularization elsewhere; AREDS age related eye disease study; ARMD age related macular degeneration; POAG primary open angle glaucoma; EBMD epithelial/anterior basement membrane dystrophy; ACIOL anterior chamber intraocular lens; IOL intraocular lens; PCIOL posterior chamber intraocular lens; Phaco/IOL phacoemulsification with intraocular lens placement; [place] photorefractive keratectomy; LASIK laser assisted in situ keratomileusis; HTN hypertension; DM diabetes mellitus; COPD chronic obstructive pulmonary disease

## 2018-03-25 ENCOUNTER — Other Ambulatory Visit: Payer: Self-pay | Admitting: Family Medicine

## 2018-03-25 NOTE — Telephone Encounter (Signed)
Copied from [place] [id number]. Topic: Quick Communication - Rx Refill/Question >> Mar 25, 2018 11:06 AM Yvette Rack wrote: Medication: losartan (COZAAR) 50 MG tablet Pt ride will be going out of town tomorrow will be back in town Friday  Has the patient contacted their pharmacy? Yes.   (Agent: If no, request that the patient contact the pharmacy for the refill.) (Agent: If yes, when and what did the pharmacy advise?) told pt to call provider to get filled quicker  Preferred Pharmacy (with phone number or street name): [place], [place] - [place] [phone] (Phone) [phone] (Fax)    Agent: Please be advised that RX refills may take up to 3 business days. We ask that you follow-up with your pharmacy.

## 2018-03-25 NOTE — Telephone Encounter (Signed)
Medication reordered on 03/25/18

## 2018-03-26 ENCOUNTER — Ambulatory Visit (INDEPENDENT_AMBULATORY_CARE_PROVIDER_SITE_OTHER): Payer: BLUE CROSS/BLUE SHIELD | Admitting: Ophthalmology

## 2018-03-26 ENCOUNTER — Encounter (INDEPENDENT_AMBULATORY_CARE_PROVIDER_SITE_OTHER): Payer: Self-pay | Admitting: Ophthalmology

## 2018-03-26 DIAGNOSIS — H25813 Combined forms of age-related cataract, bilateral: Secondary | ICD-10-CM

## 2018-03-26 DIAGNOSIS — E113513 Type 2 diabetes mellitus with proliferative diabetic retinopathy with macular edema, bilateral: Secondary | ICD-10-CM | POA: Diagnosis not present

## 2018-03-26 DIAGNOSIS — H3581 Retinal edema: Secondary | ICD-10-CM

## 2018-03-26 DIAGNOSIS — H35033 Hypertensive retinopathy, bilateral: Secondary | ICD-10-CM | POA: Diagnosis not present

## 2018-03-26 DIAGNOSIS — I10 Essential (primary) hypertension: Secondary | ICD-10-CM

## 2018-03-26 DIAGNOSIS — H4311 Vitreous hemorrhage, right eye: Secondary | ICD-10-CM

## 2018-03-26 LAB — HM DIABETES EYE EXAM

## 2018-03-27 ENCOUNTER — Encounter (INDEPENDENT_AMBULATORY_CARE_PROVIDER_SITE_OTHER): Payer: Self-pay | Admitting: Ophthalmology

## 2018-04-03 ENCOUNTER — Encounter: Payer: Self-pay | Admitting: Family Medicine

## 2018-04-03 ENCOUNTER — Ambulatory Visit: Payer: BLUE CROSS/BLUE SHIELD | Admitting: Family Medicine

## 2018-04-03 VITALS — BP 124/81 | HR 96 | Temp 98.2°F | Resp 16 | Ht 64.0 in | Wt 154.1 lb

## 2018-04-03 DIAGNOSIS — R0989 Other specified symptoms and signs involving the circulatory and respiratory systems: Secondary | ICD-10-CM

## 2018-04-03 DIAGNOSIS — E1169 Type 2 diabetes mellitus with other specified complication: Secondary | ICD-10-CM

## 2018-04-03 DIAGNOSIS — E7800 Pure hypercholesterolemia, unspecified: Secondary | ICD-10-CM

## 2018-04-03 LAB — COMPREHENSIVE METABOLIC PANEL
ALT: 23 U/L (ref 0–35)
AST: 17 U/L (ref 0–37)
Albumin: 4.3 g/dL (ref 3.5–5.2)
Alkaline Phosphatase: 67 U/L (ref 39–117)
BUN: 12 mg/dL (ref 6–23)
CHLORIDE: 105 meq/L (ref 96–112)
CO2: 24 mEq/L (ref 19–32)
Calcium: 9.6 mg/dL (ref 8.4–10.5)
Creatinine, Ser: 0.5 mg/dL (ref 0.40–1.20)
GFR: 130.94 mL/min (ref >60.00)
Glucose, Bld: 110 mg/dL — ABNORMAL HIGH (ref 70–99)
Potassium: 4.9 mEq/L (ref 3.5–5.1)
Sodium: 141 mEq/L (ref 135–145)
Total Bilirubin: 0.2 mg/dL (ref 0.2–1.2)
Total Protein: 6.8 g/dL (ref 6.0–8.3)

## 2018-04-03 LAB — HEMOGLOBIN A1C: Hgb A1c MFr Bld: 8 % — ABNORMAL HIGH (ref 4.6–6.5)

## 2018-04-03 MED ORDER — LOSARTAN POTASSIUM 100 MG PO TABS: 100.0000 mg | ORAL_TABLET | Freq: Every day | ORAL | 1 refills | Status: DC

## 2018-04-03 MED ORDER — HYDROCODONE-ACETAMINOPHEN 5-325 MG PO TABS: 1.0000 | ORAL_TABLET | Freq: Four times a day (QID) | ORAL | 0 refills | Status: DC | PRN

## 2018-04-03 NOTE — Progress Notes (Signed)
OFFICE VISIT  04/03/2018   CC:  Chief Complaint  Patient presents with  . Follow-up    RCI, pt is not fasting.     HPI:    Patient is a 48 y.o. Caucasian female who presents for 4 month f/u DM 2 and HTN.  DM: she is on full dose metformin, glipizide xl, and invokana. I have her on losartan 50mg  for hx of microalbuminuria (she had cough from ACE-I).  R eye diab retpthy gradually improving a little the last month or so.  Vision improving. She was put on nearly complete bed rest for a while. No exercise during this time.    She is sticking with her diet: very limited carbs in afternoons. Last few weeks or so fastings 105-130.  This morning 173.   In Nov it was upper 200s, low 300s and this was during a time of a lot of stress. Feet: no burning, tingling, or numbness.  Home bp monitoring with wrist cuff: 130s/80s.  At eye MD office it was up to 150s/90. Occ very high reading on wrist cuff 190/100, then 5 min later back to 130s/80s. No disequilibrium or nausea.  From a GI standpoint she only occasionally has some bloated feeling, only occ nausea.  No vomiting. Eating w/out problem. She has put off the EGD that was planned by GI since all her eye problems came up.  She plans on getting this possibly in summer this year.  Past Medical History:  Diagnosis Date  . Abdominal bloating    likely from diab gastroparesis.  Dr. Havery Moros to do EGD as of 10/2017 GI eval.  . Benign brain tumor ([place])    Cystic lesion in cerebral aqueduct region with mild hydrocephalus-- stable MRI 02/2016.  Surveillance MRI 05/2017 --dilated cerebral aqueduct related to aqueductal stenosis and subsequent mild hydrocephalus (due to the 11 mm stable cystic lesion in cerebral aqueduct---?congenitial?.  . Dysmenorrhea    vicodin occ during first 2 days of cycle.  . Gluten intolerance    pt reports she underwent full GI w/u to r/o celiac dz  . Hepatic steatosis    ultrasound 08/2017  . Hyperlipidemia, mixed   .  Hypertensive retinopathy of both eyes   . Proliferative diabetic retinopathy of both eyes ([place])    steroid injections 10/2017--improved  . Sensorineural hearing loss of left ear    Sudden left hearing loss summer 2016--no improvement with steroids 01/2015 so brain MRI done by Dr. Redmond Baseman and it showed brain tumor that was determined to be benign.  Pt's hearing not bad enough for hearing aid as of 06/2016.  . Type 2 diabetes with complication ([place])    diab retpthy, diabetic gastroparesis (gastric emptying study mildly abnl 03/2017)  . White coat hypertension     Past Surgical History:  Procedure Laterality Date  . CHOLECYSTECTOMY  2000  . GASTRIC EMPTYING SCAN  04/20/2017   Mildly abnormal, particularly the 1st hour of emptying.    Outpatient Medications Prior to Visit  Medication Sig Dispense Refill  . glipiZIDE (GLUCOTROL XL) 10 MG 24 hr tablet TAKE 1 TABLET BY MOUTH WITH BREAKFAST 90 tablet 1  . INVOKANA 300 MG TABS tablet TAKE 1 TABLET BY MOUTH ONCE DAILY BEFORE  BREAKFAST 90 tablet 1  . metFORMIN (GLUCOPHAGE-XR) 500 MG 24 hr tablet TAKE 2 TABLETS BY MOUTH TWICE DAILY 360 tablet 1  . pantoprazole (PROTONIX) 40 MG tablet TAKE 1 TABLET BY MOUTH ONCE DAILY 90 tablet 1  . promethazine (PHENERGAN) 12.5 MG  tablet 1-2 tabs po q6h prn nausea 30 tablet 1  . temazepam (RESTORIL) 15 MG capsule Take 1 capsule (15 mg total) by mouth at bedtime as needed for sleep. 30 capsule 1  . HYDROcodone-acetaminophen (NORCO/VICODIN) 5-325 MG tablet Take 1-2 tablets by mouth every 6 (six) hours as needed for moderate pain. 40 tablet 0  . losartan (COZAAR) 50 MG tablet TAKE 1 TABLET BY MOUTH ONCE DAILY 30 tablet 0   Facility-Administered Medications Prior to Visit  Medication Dose Route Frequency Provider Last Rate Last Dose  . Bevacizumab (AVASTIN) SOLN 1.25 mg  1.25 mg Intravitreal  Bernarda Caffey, MD   1.25 mg at 11/18/17 0000  . Bevacizumab (AVASTIN) SOLN 1.25 mg  1.25 mg Intravitreal  Bernarda Caffey, MD   1.25  mg at 11/23/17 1250  . Bevacizumab (AVASTIN) SOLN 1.25 mg  1.25 mg Intravitreal  Bernarda Caffey, MD   1.25 mg at 12/23/17 2313  . Bevacizumab (AVASTIN) SOLN 1.25 mg  1.25 mg Intravitreal  Bernarda Caffey, MD   1.25 mg at 12/23/17 2314  . Bevacizumab (AVASTIN) SOLN 1.25 mg  1.25 mg Intravitreal  Bernarda Caffey, MD   1.25 mg at 01/14/18 2356  . Bevacizumab (AVASTIN) SOLN 1.25 mg  1.25 mg Intravitreal  Bernarda Caffey, MD   1.25 mg at 01/14/18 2356  . Bevacizumab (AVASTIN) SOLN 1.25 mg  1.25 mg Intravitreal  Bernarda Caffey, MD   1.25 mg at 02/11/18 2358  . Bevacizumab (AVASTIN) SOLN 1.25 mg  1.25 mg Intravitreal  Bernarda Caffey, MD   1.25 mg at 02/11/18 2359  . Bevacizumab (AVASTIN) SOLN 1.25 mg  1.25 mg Intravitreal  Bernarda Caffey, MD   1.25 mg at 03/12/18 1633  . Bevacizumab (AVASTIN) SOLN 1.25 mg  1.25 mg Intravitreal  Bernarda Caffey, MD   1.25 mg at 03/12/18 1633    Allergies  Allergen Reactions  . Gluten Meal Swelling  . Lisinopril Cough  . Pioglitazone Other (See Comments)    ELEVATED glucoses + worse chronic nausea    ROS As per HPI  PE: Blood pressure 124/81, pulse 96, temperature 98.2 F (36.8 C), temperature source Oral, resp. rate 16, height 5\' 4"  (1.626 m), weight 154 lb 2 oz (69.9 kg), last menstrual period 03/11/2018, SpO2 99 %. Body mass index is 26.46 kg/m.  Gen: Alert, well appearing.  Patient is oriented to person, place, time, and situation. AFFECT: pleasant, lucid thought and speech. No further exam today.  LABS:  Lab Results  Component Value Date   TSH 1.65 08/22/2017   Lab Results  Component Value Date   WBC 8.2 03/15/2017   HGB 12.7 03/15/2017   HCT 39.1 03/15/2017   MCV 85.3 03/15/2017   PLT 428.0 (H) 03/15/2017   Lab Results  Component Value Date   CREATININE 0.54 11/28/2017   BUN 14 11/28/2017   NA 140 11/28/2017   K 4.5 11/28/2017   CL 104 11/28/2017   CO2 23 11/28/2017   Lab Results  Component Value Date   ALT 33 03/15/2017   AST 21 03/15/2017    ALKPHOS 70 03/15/2017   BILITOT 0.2 03/15/2017   Lab Results  Component Value Date   CHOL 151 07/19/2016   Lab Results  Component Value Date   HDL 34.50 (L) 07/19/2016   No results found for: [facility] Lab Results  Component Value Date   TRIG 276.0 (H) 07/19/2016   Lab Results  Component Value Date   CHOLHDL 4 07/19/2016   Lab Results  Component Value  Date   HGBA1C 7.7 (H) 11/28/2017    IMPRESSION AND PLAN:  1) DM 2, control variable. Normal feet exam today. She is seeing retinal specialist for treatment of her diabetic retinopathy (intravitreous injections). She is on losartan 50mg  qd for hx of proteinuria and I'll increase this to 100mg  qd today. Continue full dose metformin, invokana, and glipizide xl. Hba1c and CMET today.  2) HTN: bp labile. She'll get an upper arm cuff to replace her wrist cuff. Increase lisinopril to 100 mg qd today.  3) Hyperlipidemia: intolerant of atorvastatin. She does not want to try another statin at this time but we'll continue to discuss this at future visits.  An After Visit Summary was printed and given to the patient.  FOLLOW UP: Return for 3-4 months f/u DM/HTN.  Signed:  Crissie Sickles, MD           04/03/2018

## 2018-04-04 ENCOUNTER — Telehealth: Payer: Self-pay | Admitting: Family Medicine

## 2018-04-04 NOTE — Telephone Encounter (Signed)
See lab note.  

## 2018-04-04 NOTE — Telephone Encounter (Signed)
Copied from [place]. Topic: Quick Communication - Lab Results (Clinic Use ONLY) >> Apr 04, 2018  1:45 PM Onalee Hua, CMA wrote: See lab result note dated 04/03/18. Result note has been sent to [facility] NR Triage.  Okay for [facility] to discuss results/PCP recommendations.

## 2018-04-07 NOTE — Progress Notes (Signed)
[facility] Note  04/09/2018     CHIEF COMPLAINT Patient presents for Retina Follow Up   HISTORY OF PRESENT ILLNESS: Gloria Gomez is a 48 y.o. female who presents to the clinic today for:   HPI    Retina Follow Up    Patient presents with  Other.  In right eye.  This started 7 weeks ago.  Severity is moderate.  Duration of 2 weeks.  Since onset it is stable.  I, the attending physician,  performed the HPI with the patient and updated documentation appropriately.          Comments    48 y/o female pt here for 2 wk f/u for VH OD.  No change in [place] OU.  Denies pain, flashes, but has noticed a few more black "squiggly lines" in her left eye over the last wk or so.  Has been more active lately.  No gtts.  BS 111 2 days ago.  A1C 7.9 2 wks ago.       Last edited by Bernarda Caffey, MD on 04/09/2018  4:25 PM. (History)    pt states her vision is doing well, but her right eye didn't seem quite as clear on the chart today, she states she has been doing some bending and lifting lately so she thinks that may have stirred up the blood, pt states her blood pressure medication has been increased to 100mg  from 50mg   Referring physician: Tammi Sou, MD [address] [place], [place] [address]  HISTORICAL INFORMATION:   Selected notes from the MEDICAL RECORD NUMBER Referred for DM exam LEE:  Ocular Hx-NPDR PMH-DM (takes invokana and metformin), high cholesterol, benign brain tumor, white coat HTN    CURRENT MEDICATIONS: No current outpatient medications on file. (Ophthalmic Drugs)   No current facility-administered medications for this visit.  (Ophthalmic Drugs)   Current Outpatient Medications (Other)  Medication Sig  . glipiZIDE (GLUCOTROL XL) 10 MG 24 hr tablet TAKE 1 TABLET BY MOUTH WITH BREAKFAST  . HYDROcodone-acetaminophen (NORCO/VICODIN) 5-325 MG tablet Take 1-2 tablets by mouth every 6 (six) hours as needed for moderate pain.  . INVOKANA  300 MG TABS tablet TAKE 1 TABLET BY MOUTH ONCE DAILY BEFORE  BREAKFAST  . losartan (COZAAR) 100 MG tablet Take 1 tablet (100 mg total) by mouth daily.  . metFORMIN (GLUCOPHAGE-XR) 500 MG 24 hr tablet TAKE 2 TABLETS BY MOUTH TWICE DAILY  . pantoprazole (PROTONIX) 40 MG tablet TAKE 1 TABLET BY MOUTH ONCE DAILY  . promethazine (PHENERGAN) 12.5 MG tablet 1-2 tabs po q6h prn nausea  . temazepam (RESTORIL) 15 MG capsule Take 1 capsule (15 mg total) by mouth at bedtime as needed for sleep.   Current Facility-Administered Medications (Other)  Medication Route  . Bevacizumab (AVASTIN) SOLN 1.25 mg Intravitreal  . Bevacizumab (AVASTIN) SOLN 1.25 mg Intravitreal  . Bevacizumab (AVASTIN) SOLN 1.25 mg Intravitreal  . Bevacizumab (AVASTIN) SOLN 1.25 mg Intravitreal  . Bevacizumab (AVASTIN) SOLN 1.25 mg Intravitreal  . Bevacizumab (AVASTIN) SOLN 1.25 mg Intravitreal  . Bevacizumab (AVASTIN) SOLN 1.25 mg Intravitreal  . Bevacizumab (AVASTIN) SOLN 1.25 mg Intravitreal  . Bevacizumab (AVASTIN) SOLN 1.25 mg Intravitreal  . Bevacizumab (AVASTIN) SOLN 1.25 mg Intravitreal  . Bevacizumab (AVASTIN) SOLN 1.25 mg Intravitreal  . Bevacizumab (AVASTIN) SOLN 1.25 mg Intravitreal      REVIEW OF SYSTEMS: ROS    Positive for: Endocrine, Eyes   Negative for: Constitutional, Gastrointestinal, Neurological, Skin, Genitourinary,  Musculoskeletal, HENT, Cardiovascular, Respiratory, Psychiatric, Allergic/Imm, Heme/Lymph   Last edited by Matthew Folks, COA on 04/09/2018  2:50 PM. (History)       ALLERGIES Allergies  Allergen Reactions  . Gluten Meal Swelling  . Lisinopril Cough  . Pioglitazone Other (See Comments)    ELEVATED glucoses + worse chronic nausea    PAST MEDICAL HISTORY Past Medical History:  Diagnosis Date  . Abdominal bloating    likely from diab gastroparesis.  Dr. Havery Moros to do EGD as of 10/2017 GI eval.  . Benign brain tumor ([place])    Cystic lesion in cerebral aqueduct region with mild  hydrocephalus-- stable MRI 02/2016.  Surveillance MRI 05/2017 --dilated cerebral aqueduct related to aqueductal stenosis and subsequent mild hydrocephalus (due to the 11 mm stable cystic lesion in cerebral aqueduct---?congenitial?.  . Dysmenorrhea    vicodin occ during first 2 days of cycle.  . Gluten intolerance    pt reports she underwent full GI w/u to r/o celiac dz  . Hepatic steatosis    ultrasound 08/2017  . Hyperlipidemia, mixed   . Hypertensive retinopathy of both eyes   . Proliferative diabetic retinopathy of both eyes ([place])    steroid injections 10/2017--improved  . Sensorineural hearing loss of left ear    Sudden left hearing loss summer 2016--no improvement with steroids 01/2015 so brain MRI done by Dr. Redmond Baseman and it showed brain tumor that was determined to be benign.  Pt's hearing not bad enough for hearing aid as of 06/2016.  . Type 2 diabetes with complication ([place])    diab retpthy, diabetic gastroparesis (gastric emptying study mildly abnl 03/2017)  . White coat hypertension    Past Surgical History:  Procedure Laterality Date  . CHOLECYSTECTOMY  2000  . GASTRIC EMPTYING SCAN  04/20/2017   Mildly abnormal, particularly the 1st hour of emptying.    FAMILY HISTORY Family History  Problem Relation Age of Onset  . Brain cancer Mother   . Diabetes Father   . Diabetes Maternal Grandmother   . Cataracts Maternal Grandmother   . Cervical cancer Paternal Grandmother   . Colon cancer Maternal Grandfather 44  . Amblyopia Neg Hx   . Blindness Neg Hx   . Glaucoma Neg Hx   . Macular degeneration Neg Hx   . Retinal detachment Neg Hx   . Strabismus Neg Hx   . Retinitis pigmentosa Neg Hx     SOCIAL HISTORY Social History   Tobacco Use  . Smoking status: Never Smoker  . Smokeless tobacco: Never Used  Substance Use Topics  . Alcohol use: No  . Drug use: No         OPHTHALMIC EXAM:  Base Eye Exam    Visual Acuity (Snellen - Linear)      Right Left   Dist cc 20/30 -2  20/25 +2   Dist ph cc NI NI   Correction:  Glasses       Tonometry (Tonopen, 2:53 PM)      Right Left   Pressure 17 16       Pupils      Dark Light Shape React APD   Right 3 2 Round Brisk None   Left 3 2 Round Brisk None       Visual Fields (Counting fingers)      Left Right    Full Full       Extraocular Movement      Right Left    Full, Ortho Full, Ortho  Neuro/Psych    Oriented x3:  Yes   Mood/Affect:  Normal       Dilation    Both eyes:  1.0% Mydriacyl, 2.5% Phenylephrine @ 2:53 PM        Slit Lamp and Fundus Exam    Slit Lamp Exam      Right Left   Lids/Lashes Dermatochalasis - upper lid, Meibomian gland dysfunction, Telangiectasia Dermatochalasis - upper lid, Meibomian gland dysfunction, Telangiectasia   Conjunctiva/Sclera White and quiet White and quiet   Cornea 1+ Punctate epithelial erosions Clear   Anterior Chamber Deep and quiet Deep and quiet   Iris Round and dilated, No NVI Round and dilated, No NVI   Lens 2+ Nuclear sclerosis, 2+ Cortical cataract 2+ Nuclear sclerosis, 2+ Cortical cataract   Vitreous RBC's turning white, mildly diffuse VH, red blood clots settling inferiorly Vitreous syneresis, residual VH settled inferiorly       Fundus Exam      Right Left   Disc pink and sharp, fine NVD Regressed NVE and fibrosis, compact   C/D Ratio 0.1 0.1   Macula flat, scattered exudate good foveal reflex, cluster of MA's and exudates temporal macula   Vessels Vascular attenuation, Tortuous Vascular attenuation, mild Tortuousity   Periphery  Attached, 360 PRP scars superior nasal and temporal Attached, scattered DBH, good 360 PRP laser changes, room for fill in posteriorly.           IMAGING AND PROCEDURES  Imaging and Procedures for @TODAY @  OCT, Retina - OU - Both Eyes       Right Eye Quality was poor. Central Foveal Thickness: 331. Progression has improved. Findings include intraretinal fluid, no SRF, epiretinal membrane, intraretinal  hyper-reflective material, vitreous traction, abnormal foveal contour (Persistent vitreous opacities, mild ERM, interval improvement in temporal IRF).   Left Eye Quality was good. Central Foveal Thickness: 295. Progression has been stable. Findings include intraretinal fluid, no SRF, normal foveal contour, intraretinal hyper-reflective material, epiretinal membrane (Mildly improved IRF).   Notes *Images captured and stored on drive  Diagnosis / Impression:  OD: interval improvement in VH, interval increase in DME/IRF, mild ERM OS: abnormal foveal contour; +IRF; preretinal fibrosis about the disc; stable/persistent cystic changes  Clinical management:  See below  Abbreviations: NFP - Normal foveal profile. CME - cystoid macular edema. PED - pigment epithelial detachment. IRF - intraretinal fluid. SRF - subretinal fluid. EZ - ellipsoid zone. ERM - epiretinal membrane. ORA - outer retinal atrophy. ORT - outer retinal tubulation. SRHM - subretinal hyper-reflective material         Intravitreal Injection, Pharmacologic Agent - OD - Right Eye       Time Out 04/09/2018. 5:05 PM. Confirmed correct patient, procedure, site, and patient consented.   Anesthesia Topical anesthesia was used. Anesthetic medications included Lidocaine 2%, Proparacaine 0.5%.   Procedure Preparation included 5% betadine to ocular surface, eyelid speculum. A 30 gauge needle was used.   Injection:  1.25 mg Bevacizumab (AVASTIN) SOLN   NDC: 27035-009-38, Lot: [id number] , Expiration date: 06/28/2018   Route: Intravitreal, Site: Right Eye, Waste: 0 mg  Post-op Post injection exam found visual acuity of at least counting fingers. The patient tolerated the procedure well. There were no complications. The patient received written and verbal post procedure care education.        Intravitreal Injection, Pharmacologic Agent - OS - Left Eye       Time Out 04/09/2018. 5:05 PM. Confirmed correct patient, procedure,  site, and patient consented.  Anesthesia Topical anesthesia was used. Anesthetic medications included Lidocaine 2%, Proparacaine 0.5%.   Procedure Preparation included 5% betadine to ocular surface, eyelid speculum. A 30 gauge needle was used.   Injection:  1.25 mg Bevacizumab (AVASTIN) SOLN   NDC: 37048-889-16, Lot: [id number] , Expiration date: 05/02/2018   Route: Intravitreal, Site: Left Eye, Waste: 0 mL  Post-op Post injection exam found visual acuity of at least counting fingers. The patient tolerated the procedure well. There were no complications. The patient received written and verbal post procedure care education.                 ASSESSMENT/PLAN:    ICD-10-CM   1. Vitreous hemorrhage of right eye (HCC) H43.11 Intravitreal Injection, Pharmacologic Agent - OD - Right Eye    Bevacizumab (AVASTIN) SOLN 1.25 mg  2. Proliferative diabetic retinopathy of both eyes with macular edema associated with type 2 diabetes mellitus (HCC) 16/06/2018 Intravitreal Injection, Pharmacologic Agent - OD - Right Eye    Intravitreal Injection, Pharmacologic Agent - OS - Left Eye    Bevacizumab (AVASTIN) SOLN 1.25 mg    Bevacizumab (AVASTIN) SOLN 1.25 mg  3. Retinal edema H35.81 OCT, Retina - OU - Both Eyes  4. Hypertensive retinopathy of both eyes H35.033   5. Essential hypertension I10   6. Combined forms of age-related cataract of both eyes H25.813     1. Vitreous Hemorrhage OD -interval improvement - secondary to PDR as described below - S/P IVA OD #1 (09.20.19), #2 (10.25.19), #3 (11.15.19), #4 (12.16.19), #5 (03/12/2018) - S/P PRP OD #1 (09.27.19), fill in OD (11.21.19) -- each somewhat limited by residual VH - BCVA 20/30-2 - diffuse VH clearing and settling inferiorly - VH precautions reviewed -- minimize activities, keep head elevated, avoid ASA/NSAIDs/blood thinners as able - continue monitoring  2,3. Proliferative diabetic retinopathy w/ DME, OU - s/p IVA OD #1 9.20.19, #2  (10.25.19), #3 (11.15.19), #4 (12.17.19), #5 (01.14.20) - s/p IVA OS #1 9.27.19, #2 (10.25.19), #3 (11.15.19), #4 (12.17.19), #5 (01.14.20) - today, OD with improved VH from prior visit, OS doing well -- VA stable at 20/25 - HbA1c 7.7% (10.2.19) from 10.3% (6.26.19) - FA (9.20.19) shows +NVE OU and leaking MA and capillary nonperfusion - repeat FA 11.15.19 shows NV regressing OU - OCT confirms diabetic macular edema OU -- improving - S/P PRP OS (09.20.19) - S/P PRP OD (9.27.19 and 11.21.19) - inferior periphery obscured by [facility] - recommend IVA OU today, 2.11.2020 for DME OU and VH OD - pt wishes to proceed with IVA OU - RBA of procedure discussed, questions answered - informed consent obtained and signed - see procedure note - f/u 4 weeks for repeat injection OU, may need fill-in laser in the future   4,5. Hypertensive retinopathy OU - discussed importance of tight BP control. - monitor for now  6. Combined form age related cataract OU-  - The symptoms of cataract, surgical options, and treatments and risks were discussed with patient. - discussed diagnosis and progression - not yet visually significant - monitor for now   Ophthalmic Meds Ordered this visit:  Meds ordered this encounter  Medications  . Bevacizumab (AVASTIN) SOLN 1.25 mg  . Bevacizumab (AVASTIN) SOLN 1.25 mg       Return in about 4 weeks (around 05/07/2018) for f/u PDR OU, DFE, OCT.  There are no Patient Instructions on file for this visit.   Explained the diagnoses, plan, and follow up with the patient and they expressed understanding.  Patient  expressed understanding of the importance of proper follow up care.   This document serves as a record of services personally performed by Gardiner Sleeper, MD, PhD. It was created on their behalf by Ernest Mallick, OA, an ophthalmic assistant. The creation of this record is the provider's dictation and/or activities during the visit.    Electronically signed by: Ernest Mallick, OA  02.09.2020 11:23 PM    Gardiner Sleeper, M.D., Ph.D. Diseases & Surgery of the Retina and Vitreous [facility]  I have reviewed the above documentation for accuracy and completeness, and I agree with the above. Gardiner Sleeper, M.D., Ph.D. 04/09/18 11:23 PM    Abbreviations: M myopia (nearsighted); A astigmatism; H hyperopia (farsighted); P presbyopia; Mrx spectacle prescription;  CTL contact lenses; OD right eye; OS left eye; OU both eyes  XT exotropia; ET esotropia; PEK punctate epithelial keratitis; PEE punctate epithelial erosions; DES dry eye syndrome; MGD meibomian gland dysfunction; ATs artificial tears; PFAT's preservative free artificial tears; [place] nuclear sclerotic cataract; PSC posterior subcapsular cataract; ERM epi-retinal membrane; PVD posterior vitreous detachment; RD retinal detachment; DM diabetes mellitus; DR diabetic retinopathy; NPDR non-proliferative diabetic retinopathy; PDR proliferative diabetic retinopathy; CSME clinically significant macular edema; DME diabetic macular edema; dbh dot blot hemorrhages; CWS cotton wool spot; POAG primary open angle glaucoma; C/D cup-to-disc ratio; HVF humphrey visual field; GVF goldmann visual field; OCT optical coherence tomography; IOP intraocular pressure; BRVO Branch retinal vein occlusion; CRVO central retinal vein occlusion; CRAO central retinal artery occlusion; BRAO branch retinal artery occlusion; RT retinal tear; SB scleral buckle; PPV pars plana vitrectomy; VH Vitreous hemorrhage; PRP panretinal laser photocoagulation; IVK intravitreal kenalog; VMT vitreomacular traction; MH Macular hole;  NVD neovascularization of the disc; NVE neovascularization elsewhere; AREDS age related eye disease study; ARMD age related macular degeneration; POAG primary open angle glaucoma; EBMD epithelial/anterior basement membrane dystrophy; ACIOL anterior chamber intraocular lens; IOL intraocular lens; PCIOL posterior chamber  intraocular lens; Phaco/IOL phacoemulsification with intraocular lens placement; [place] photorefractive keratectomy; LASIK laser assisted in situ keratomileusis; HTN hypertension; DM diabetes mellitus; COPD chronic obstructive pulmonary disease

## 2018-04-09 ENCOUNTER — Ambulatory Visit (INDEPENDENT_AMBULATORY_CARE_PROVIDER_SITE_OTHER): Payer: BLUE CROSS/BLUE SHIELD | Admitting: Ophthalmology

## 2018-04-09 ENCOUNTER — Encounter (INDEPENDENT_AMBULATORY_CARE_PROVIDER_SITE_OTHER): Payer: Self-pay | Admitting: Ophthalmology

## 2018-04-09 DIAGNOSIS — E113513 Type 2 diabetes mellitus with proliferative diabetic retinopathy with macular edema, bilateral: Secondary | ICD-10-CM

## 2018-04-09 DIAGNOSIS — H3581 Retinal edema: Secondary | ICD-10-CM

## 2018-04-09 DIAGNOSIS — I10 Essential (primary) hypertension: Secondary | ICD-10-CM

## 2018-04-09 DIAGNOSIS — H25813 Combined forms of age-related cataract, bilateral: Secondary | ICD-10-CM

## 2018-04-09 DIAGNOSIS — H35033 Hypertensive retinopathy, bilateral: Secondary | ICD-10-CM | POA: Diagnosis not present

## 2018-04-09 DIAGNOSIS — H4311 Vitreous hemorrhage, right eye: Secondary | ICD-10-CM

## 2018-04-09 MED ORDER — BEVACIZUMAB CHEMO INJECTION 1.25MG/0.05ML SYRINGE FOR KALEIDOSCOPE
1.2500 mg | INTRAVITREAL | Status: DC
  Administered 2018-04-09: 1.25 mg via INTRAVITREAL

## 2018-04-12 ENCOUNTER — Ambulatory Visit (INDEPENDENT_AMBULATORY_CARE_PROVIDER_SITE_OTHER): Payer: BLUE CROSS/BLUE SHIELD | Admitting: Ophthalmology

## 2018-04-12 ENCOUNTER — Encounter (HOSPITAL_COMMUNITY): Admission: AD | Disposition: A | Discharge status: Home / Self Care | Payer: Self-pay | Attending: Ophthalmology

## 2018-04-12 ENCOUNTER — Other Ambulatory Visit: Payer: Self-pay

## 2018-04-12 ENCOUNTER — Ambulatory Visit (HOSPITAL_COMMUNITY)
Admission: AD | Admit: 2018-04-12 | Discharge: 2018-04-12 | Disposition: A | Discharge status: Home / Self Care | Payer: BLUE CROSS/BLUE SHIELD | Source: Other Acute Inpatient Hospital | Attending: Ophthalmology | Admitting: Ophthalmology

## 2018-04-12 ENCOUNTER — Inpatient Hospital Stay (HOSPITAL_COMMUNITY): Payer: BLUE CROSS/BLUE SHIELD | Admitting: Anesthesiology

## 2018-04-12 ENCOUNTER — Encounter (HOSPITAL_COMMUNITY): Payer: Self-pay

## 2018-04-12 DIAGNOSIS — H3581 Retinal edema: Secondary | ICD-10-CM

## 2018-04-12 DIAGNOSIS — H4311 Vitreous hemorrhage, right eye: Secondary | ICD-10-CM | POA: Diagnosis not present

## 2018-04-12 DIAGNOSIS — E1143 Type 2 diabetes mellitus with diabetic autonomic (poly)neuropathy: Secondary | ICD-10-CM | POA: Insufficient documentation

## 2018-04-12 DIAGNOSIS — Z7984 Long term (current) use of oral hypoglycemic drugs: Secondary | ICD-10-CM | POA: Diagnosis not present

## 2018-04-12 DIAGNOSIS — I10 Essential (primary) hypertension: Secondary | ICD-10-CM | POA: Diagnosis not present

## 2018-04-12 DIAGNOSIS — K3184 Gastroparesis: Secondary | ICD-10-CM | POA: Diagnosis not present

## 2018-04-12 DIAGNOSIS — E113513 Type 2 diabetes mellitus with proliferative diabetic retinopathy with macular edema, bilateral: Secondary | ICD-10-CM | POA: Diagnosis not present

## 2018-04-12 DIAGNOSIS — Z79899 Other long term (current) drug therapy: Secondary | ICD-10-CM | POA: Diagnosis not present

## 2018-04-12 DIAGNOSIS — H44001 Unspecified purulent endophthalmitis, right eye: Secondary | ICD-10-CM | POA: Diagnosis not present

## 2018-04-12 DIAGNOSIS — H35033 Hypertensive retinopathy, bilateral: Secondary | ICD-10-CM

## 2018-04-12 DIAGNOSIS — H25813 Combined forms of age-related cataract, bilateral: Secondary | ICD-10-CM

## 2018-04-12 DIAGNOSIS — E113593 Type 2 diabetes mellitus with proliferative diabetic retinopathy without macular edema, bilateral: Secondary | ICD-10-CM | POA: Diagnosis not present

## 2018-04-12 HISTORY — PX: PARS PLANA VITRECTOMY: SHX2166

## 2018-04-12 LAB — CBC
HCT: 38.8 % (ref 36.0–46.0)
Hemoglobin: 11.8 g/dL — ABNORMAL LOW (ref 12.0–15.0)
MCH: 24.7 pg — ABNORMAL LOW (ref 26.0–34.0)
MCHC: 30.4 g/dL (ref 30.0–36.0)
MCV: 81.3 fL (ref 80.0–100.0)
Platelets: 508 10*3/uL — ABNORMAL HIGH (ref 150–400)
RBC: 4.77 MIL/uL (ref 3.87–5.11)
RDW: 15.9 % — ABNORMAL HIGH (ref 11.5–15.5)
WBC: 12 10*3/uL — ABNORMAL HIGH (ref 4.0–10.5)
nRBC: 0 % (ref 0.0–0.2)

## 2018-04-12 LAB — POCT PREGNANCY, URINE: Preg Test, Ur: NEGATIVE

## 2018-04-12 LAB — BASIC METABOLIC PANEL
Anion gap: 11 (ref 5–15)
BUN: 12 mg/dL (ref 6–20)
CO2: 17 mmol/L — ABNORMAL LOW (ref 22–32)
Calcium: 9.2 mg/dL (ref 8.9–10.3)
Chloride: 109 mmol/L (ref 98–111)
Creatinine, Ser: 0.55 mg/dL (ref 0.44–1.00)
GFR calc Af Amer: >60 mL/min (ref >60)
GFR calc non Af Amer: >60 mL/min (ref >60)
Glucose, Bld: 179 mg/dL — ABNORMAL HIGH (ref 70–99)
Potassium: 4 mmol/L (ref 3.5–5.1)
Sodium: 137 mmol/L (ref 135–145)

## 2018-04-12 LAB — HEMOGLOBIN A1C
Hgb A1c MFr Bld: 8.4 % — ABNORMAL HIGH (ref 4.8–5.6)
Mean Plasma Glucose: 194.38 mg/dL

## 2018-04-12 LAB — GLUCOSE, CAPILLARY
Glucose-Capillary: 165 mg/dL — ABNORMAL HIGH (ref 70–99)
Glucose-Capillary: 140 mg/dL — ABNORMAL HIGH (ref 70–99)

## 2018-04-12 SURGERY — PARS PLANA VITRECTOMY WITH 25 GAUGE
Anesthesia: General | Site: Eye | Laterality: Right

## 2018-04-12 MED ORDER — DORZOLAMIDE HCL-TIMOLOL MAL 2-0.5 % OP SOLN
OPHTHALMIC | Status: AC
  Filled 2018-04-12: qty 10

## 2018-04-12 MED ORDER — PREDNISOLONE ACETATE 1 % OP SUSP
OPHTHALMIC | Status: DC | PRN
  Administered 2018-04-12: 1 [drp] via OPHTHALMIC

## 2018-04-12 MED ORDER — DIPHENHYDRAMINE HCL 50 MG/ML IJ SOLN
INTRAMUSCULAR | Status: DC | PRN
  Administered 2018-04-12: 12.5 mg via INTRAVENOUS

## 2018-04-12 MED ORDER — SUGAMMADEX SODIUM 500 MG/5ML IV SOLN
INTRAVENOUS | Status: AC
  Filled 2018-04-12: qty 5

## 2018-04-12 MED ORDER — CARBACHOL 0.01 % IO SOLN
INTRAOCULAR | Status: AC
  Filled 2018-04-12: qty 1.5

## 2018-04-12 MED ORDER — CEFEPIME INTRAOCULAR INJECTION 2.25 MG/0.1 ML KALEIDOSCOPE
11.2500 mg | Freq: Once | INTRAVITREAL | Status: AC
  Administered 2018-04-12: 11.25 mg via INTRAVITREAL
  Filled 2018-04-12: qty 0.5

## 2018-04-12 MED ORDER — BACITRACIN-POLYMYXIN B 500-10000 UNIT/GM OP OINT
TOPICAL_OINTMENT | OPHTHALMIC | Status: AC
  Filled 2018-04-12: qty 3.5

## 2018-04-12 MED ORDER — ONDANSETRON HCL 4 MG/2ML IJ SOLN
INTRAMUSCULAR | Status: AC
  Filled 2018-04-12: qty 2

## 2018-04-12 MED ORDER — TRIAMCINOLONE ACETONIDE 40 MG/ML IJ SUSP
INTRAMUSCULAR | Status: DC | PRN
  Administered 2018-04-12: 40 mg

## 2018-04-12 MED ORDER — GATIFLOXACIN 0.5 % OP SOLN
OPHTHALMIC | Status: AC
  Filled 2018-04-12: qty 2.5

## 2018-04-12 MED ORDER — ATROPINE SULFATE 1 % OP SOLN
OPHTHALMIC | Status: DC | PRN
  Administered 2018-04-12: 1 [drp] via OPHTHALMIC

## 2018-04-12 MED ORDER — BSS IO SOLN
INTRAOCULAR | Status: AC
  Filled 2018-04-12: qty 15

## 2018-04-12 MED ORDER — VANCOMYCIN INTRAVITREAL INJECTION 1 MG/0.1 ML
1.0000 mg | INTRAOCULAR | Status: AC
  Administered 2018-04-12: 1 mg via INTRAVITREAL
  Filled 2018-04-12: qty 0

## 2018-04-12 MED ORDER — VANCOMYCIN HCL IN DEXTROSE 1-5 GM/200ML-% IV SOLN
INTRAVENOUS | Status: AC
  Filled 2018-04-12: qty 200

## 2018-04-12 MED ORDER — EPINEPHRINE PF 1 MG/ML IJ SOLN
INTRAOCULAR | Status: DC | PRN
  Administered 2018-04-12: 500 mL

## 2018-04-12 MED ORDER — ACETAMINOPHEN 500 MG PO TABS
1000.0000 mg | ORAL_TABLET | Freq: Once | ORAL | Status: AC
  Administered 2018-04-12: 1000 mg via ORAL
  Filled 2018-04-12: qty 2

## 2018-04-12 MED ORDER — VANCOMYCIN HCL 1000 MG IV SOLR
INTRAVENOUS | Status: DC | PRN
  Administered 2018-04-12: 1000 mg via INTRAVENOUS

## 2018-04-12 MED ORDER — STERILE WATER FOR INJECTION IJ SOLN
INTRAMUSCULAR | Status: AC
  Filled 2018-04-12: qty 10

## 2018-04-12 MED ORDER — CEFTAZIDIME INTRAVITREAL INJECTION 2.25 MG/0.1 ML
2.2500 mg | INTRAVITREAL | Status: AC
  Administered 2018-04-12: 2.25 mg via INTRAVITREAL
  Filled 2018-04-12: qty 0
  Filled 2018-04-12: qty 0.1

## 2018-04-12 MED ORDER — NA CHONDROIT SULF-NA HYALURON 40-30 MG/ML IO SOLN
INTRAOCULAR | Status: AC
  Filled 2018-04-12: qty 1

## 2018-04-12 MED ORDER — DEXAMETHASONE SODIUM PHOSPHATE 10 MG/ML IJ SOLN
INTRAMUSCULAR | Status: DC | PRN
  Administered 2018-04-12: 4 mg via INTRAVENOUS

## 2018-04-12 MED ORDER — CEFTAZIDIME 1 G IJ SOLR
INTRAMUSCULAR | Status: AC
  Filled 2018-04-12: qty 1

## 2018-04-12 MED ORDER — TRIAMCINOLONE ACETONIDE 40 MG/ML IJ SUSP
INTRAMUSCULAR | Status: AC
  Filled 2018-04-12: qty 5

## 2018-04-12 MED ORDER — DEXAMETHASONE SODIUM PHOSPHATE 10 MG/ML IJ SOLN
INTRAMUSCULAR | Status: DC | PRN
  Administered 2018-04-12: 10 mg

## 2018-04-12 MED ORDER — ROCURONIUM BROMIDE 50 MG/5ML IV SOSY
PREFILLED_SYRINGE | INTRAVENOUS | Status: AC
  Filled 2018-04-12: qty 5

## 2018-04-12 MED ORDER — BRIMONIDINE TARTRATE 0.2 % OP SOLN
OPHTHALMIC | Status: AC
  Filled 2018-04-12: qty 5

## 2018-04-12 MED ORDER — PROPOFOL 10 MG/ML IV BOLUS
INTRAVENOUS | Status: AC
  Filled 2018-04-12: qty 20

## 2018-04-12 MED ORDER — TROPICAMIDE 1 % OP SOLN
1.0000 [drp] | OPHTHALMIC | Status: AC | PRN
  Administered 2018-04-12 (×3): 1 [drp] via OPHTHALMIC
  Filled 2018-04-12: qty 15

## 2018-04-12 MED ORDER — BSS PLUS IO SOLN
INTRAOCULAR | Status: AC
  Filled 2018-04-12: qty 500

## 2018-04-12 MED ORDER — SUGAMMADEX SODIUM 200 MG/2ML IV SOLN
INTRAVENOUS | Status: DC | PRN
  Administered 2018-04-12: 138.8 mg via INTRAVENOUS

## 2018-04-12 MED ORDER — EPINEPHRINE PF 1 MG/ML IJ SOLN
INTRAMUSCULAR | Status: AC
  Filled 2018-04-12: qty 1

## 2018-04-12 MED ORDER — PREDNISOLONE ACETATE 1 % OP SUSP
OPHTHALMIC | Status: AC
  Filled 2018-04-12: qty 5

## 2018-04-12 MED ORDER — POLYMYXIN B SULFATE 500000 UNITS IJ SOLR
INTRAMUSCULAR | Status: AC
  Filled 2018-04-12: qty 500000

## 2018-04-12 MED ORDER — LIDOCAINE 2% (20 MG/ML) 5 ML SYRINGE
INTRAMUSCULAR | Status: DC | PRN
  Administered 2018-04-12: 100 mg via INTRAVENOUS

## 2018-04-12 MED ORDER — DEXAMETHASONE SODIUM PHOSPHATE 10 MG/ML IJ SOLN
INTRAMUSCULAR | Status: AC
  Filled 2018-04-12: qty 1

## 2018-04-12 MED ORDER — SODIUM CHLORIDE 0.9 % IV SOLN
INTRAVENOUS | Status: DC | PRN
  Administered 2018-04-12: 25 ug/min via INTRAVENOUS

## 2018-04-12 MED ORDER — SODIUM CHLORIDE (PF) 0.9 % IJ SOLN
INTRAMUSCULAR | Status: AC
  Filled 2018-04-12: qty 10

## 2018-04-12 MED ORDER — BRIMONIDINE TARTRATE 0.2 % OP SOLN
OPHTHALMIC | Status: DC | PRN
  Administered 2018-04-12: 1 [drp] via OPHTHALMIC

## 2018-04-12 MED ORDER — DORZOLAMIDE HCL-TIMOLOL MAL 2-0.5 % OP SOLN
OPHTHALMIC | Status: DC | PRN
  Administered 2018-04-12: 1 [drp] via OPHTHALMIC

## 2018-04-12 MED ORDER — PROMETHAZINE HCL 25 MG/ML IJ SOLN: 6.2500 mg | INTRAMUSCULAR | Status: DC | PRN

## 2018-04-12 MED ORDER — PROPARACAINE HCL 0.5 % OP SOLN
1.0000 [drp] | OPHTHALMIC | Status: AC | PRN
  Administered 2018-04-12 (×3): 1 [drp] via OPHTHALMIC
  Filled 2018-04-12: qty 15

## 2018-04-12 MED ORDER — ATROPINE SULFATE 1 % OP SOLN
1.0000 [drp] | OPHTHALMIC | Status: AC | PRN
  Administered 2018-04-12 (×3): 1 [drp] via OPHTHALMIC
  Filled 2018-04-12: qty 2

## 2018-04-12 MED ORDER — PHENYLEPHRINE 40 MCG/ML (10ML) SYRINGE FOR IV PUSH (FOR BLOOD PRESSURE SUPPORT)
PREFILLED_SYRINGE | INTRAVENOUS | Status: AC
  Filled 2018-04-12: qty 10

## 2018-04-12 MED ORDER — SODIUM CHLORIDE 0.9 % IV SOLN
INTRAVENOUS | Status: DC
  Administered 2018-04-12 (×2): via INTRAVENOUS

## 2018-04-12 MED ORDER — PHENYLEPHRINE HCL 10 % OP SOLN
1.0000 [drp] | OPHTHALMIC | Status: AC | PRN
  Administered 2018-04-12 (×3): 1 [drp] via OPHTHALMIC
  Filled 2018-04-12: qty 5

## 2018-04-12 MED ORDER — BSS PLUS IO SOLN: INTRAOCULAR | Status: DC | PRN

## 2018-04-12 MED ORDER — ERYTHROMYCIN 5 MG/GM OP OINT
TOPICAL_OINTMENT | OPHTHALMIC | Status: DC | PRN
  Administered 2018-04-12: 1 via OPHTHALMIC

## 2018-04-12 MED ORDER — MIDAZOLAM HCL 2 MG/2ML IJ SOLN
INTRAMUSCULAR | Status: AC
  Filled 2018-04-12: qty 2

## 2018-04-12 MED ORDER — PHENYLEPHRINE 40 MCG/ML (10ML) SYRINGE FOR IV PUSH (FOR BLOOD PRESSURE SUPPORT)
PREFILLED_SYRINGE | INTRAVENOUS | Status: DC | PRN
  Administered 2018-04-12 (×2): 80 ug via INTRAVENOUS

## 2018-04-12 MED ORDER — GATIFLOXACIN 0.5 % OP SOLN OPTIME - NO CHARGE
OPHTHALMIC | Status: DC | PRN
  Administered 2018-04-12: 1 [drp] via OPHTHALMIC

## 2018-04-12 MED ORDER — STERILE WATER FOR INJECTION IJ SOLN
INTRAMUSCULAR | Status: DC | PRN
  Administered 2018-04-12: 20 mL

## 2018-04-12 MED ORDER — MIDAZOLAM HCL 2 MG/2ML IJ SOLN
INTRAMUSCULAR | Status: DC | PRN
  Administered 2018-04-12: 2 mg via INTRAVENOUS

## 2018-04-12 MED ORDER — FENTANYL CITRATE (PF) 250 MCG/5ML IJ SOLN
INTRAMUSCULAR | Status: DC | PRN
  Administered 2018-04-12: 100 ug via INTRAVENOUS

## 2018-04-12 MED ORDER — ONDANSETRON HCL 4 MG/2ML IJ SOLN
INTRAMUSCULAR | Status: DC | PRN
  Administered 2018-04-12: 4 mg via INTRAVENOUS

## 2018-04-12 MED ORDER — FENTANYL CITRATE (PF) 250 MCG/5ML IJ SOLN
INTRAMUSCULAR | Status: AC
  Filled 2018-04-12: qty 5

## 2018-04-12 MED ORDER — DEXAMETHASONE 0.1 % OP SUSP
OPHTHALMIC | Status: AC
  Filled 2018-04-12: qty 5

## 2018-04-12 MED ORDER — FENTANYL CITRATE (PF) 100 MCG/2ML IJ SOLN: 25.0000 ug | INTRAMUSCULAR | Status: DC | PRN

## 2018-04-12 MED ORDER — ROCURONIUM BROMIDE 10 MG/ML (PF) SYRINGE
PREFILLED_SYRINGE | INTRAVENOUS | Status: DC | PRN
  Administered 2018-04-12: 50 mg via INTRAVENOUS

## 2018-04-12 MED ORDER — SCOPOLAMINE 1 MG/3DAYS TD PT72
1.0000 | MEDICATED_PATCH | TRANSDERMAL | Status: DC
  Administered 2018-04-12: 1.5 mg via TRANSDERMAL
  Filled 2018-04-12: qty 1

## 2018-04-12 MED ORDER — PROPOFOL 10 MG/ML IV BOLUS
INTRAVENOUS | Status: DC | PRN
  Administered 2018-04-12 (×2): 50 mg via INTRAVENOUS
  Administered 2018-04-12: 150 mg via INTRAVENOUS

## 2018-04-12 MED ORDER — 0.9 % SODIUM CHLORIDE (POUR BTL) OPTIME
TOPICAL | Status: DC | PRN
  Administered 2018-04-12: 1000 mL

## 2018-04-12 MED ORDER — ATROPINE SULFATE 1 % OP SOLN
OPHTHALMIC | Status: AC
  Filled 2018-04-12: qty 5

## 2018-04-12 MED ORDER — NA CHONDROIT SULF-NA HYALURON 40-30 MG/ML IO SOLN
INTRAOCULAR | Status: DC | PRN
  Administered 2018-04-12: 0.5 mL via INTRAOCULAR

## 2018-04-12 MED ORDER — ARTIFICIAL TEARS OPHTHALMIC OINT
TOPICAL_OINTMENT | OPHTHALMIC | Status: AC
  Filled 2018-04-12: qty 3.5

## 2018-04-12 MED ORDER — LIDOCAINE 2% (20 MG/ML) 5 ML SYRINGE
INTRAMUSCULAR | Status: AC
  Filled 2018-04-12: qty 5

## 2018-04-12 SURGICAL SUPPLY — 72 items
APL SWBSTK 6 STRL LF DISP (MISCELLANEOUS) ×4
APPLICATOR COTTON TIP 6 STRL (MISCELLANEOUS) ×4 IMPLANT
APPLICATOR COTTON TIP 6IN STRL (MISCELLANEOUS) ×8
BALL CTTN LRG ABS STRL LF (GAUZE/BANDAGES/DRESSINGS) ×3
BLADE EYE CATARACT 19 1.4 BEAV (BLADE) IMPLANT
BNDG EYE OVAL (GAUZE/BANDAGES/DRESSINGS) ×2 IMPLANT
CABLE BIPOLOR RESECTION CORD (MISCELLANEOUS) ×2 IMPLANT
CANNULA ANT CHAM MAIN (OPHTHALMIC RELATED) IMPLANT
CANNULA FLEX TIP 25G (CANNULA) ×3 IMPLANT
CANNULA TROCAR 23 GA VLV (OPHTHALMIC) IMPLANT
CANNULA TROCAR 23G VLV (OPHTHALMIC) IMPLANT
CANNULA VLV SOFT TIP 25G (OPHTHALMIC) IMPLANT
CANNULA VLV SOFT TIP 25GA (OPHTHALMIC) IMPLANT
CLSR STERI-STRIP ANTIMIC 1/2X4 (GAUZE/BANDAGES/DRESSINGS) ×2 IMPLANT
COTTONBALL LRG STERILE PKG (GAUZE/BANDAGES/DRESSINGS) ×6 IMPLANT
COVER WAND RF STERILE (DRAPES) ×2 IMPLANT
DRAPE MICROSCOPE LEICA 46X105 (MISCELLANEOUS) ×2 IMPLANT
DRAPE OPHTHALMIC 77X100 STRL (CUSTOM PROCEDURE TRAY) ×2 IMPLANT
ERASER HMR WETFIELD 23G BP (MISCELLANEOUS) IMPLANT
FILTER BLUE MILLIPORE (MISCELLANEOUS) IMPLANT
FILTER STRAW FLUID ASPIR (MISCELLANEOUS) IMPLANT
FORCEPS GRIESHABER ILM 25G A (INSTRUMENTS) IMPLANT
GAS AUTO FILL CONSTEL (OPHTHALMIC)
GAS AUTO FILL CONSTELLATION (OPHTHALMIC) IMPLANT
GLOVE BIO SURGEON STRL SZ7.5 (GLOVE) ×4 IMPLANT
GLOVE BIOGEL M 7.0 STRL (GLOVE) ×2 IMPLANT
GOWN STRL REUS W/ TWL LRG LVL3 (GOWN DISPOSABLE) ×2 IMPLANT
GOWN STRL REUS W/ TWL XL LVL3 (GOWN DISPOSABLE) ×1 IMPLANT
GOWN STRL REUS W/TWL LRG LVL3 (GOWN DISPOSABLE) ×4
GOWN STRL REUS W/TWL XL LVL3 (GOWN DISPOSABLE) ×2
KIT BASIN OR (CUSTOM PROCEDURE TRAY) ×2 IMPLANT
KIT PERFLUORON PROCEDURE 5ML (MISCELLANEOUS) IMPLANT
LENS MACULAR ASPHERIC CONSTEL (OPHTHALMIC) IMPLANT
LENS VITRECTOMY FLAT OCLR DISP (MISCELLANEOUS) IMPLANT
LOOP FINESSE 25 GA (MISCELLANEOUS) IMPLANT
MICROPICK 25G (MISCELLANEOUS)
NDL 18GX1X1/2 (RX/OR ONLY) (NEEDLE) ×1 IMPLANT
NDL 25GX 5/8IN NON SAFETY (NEEDLE) ×3 IMPLANT
NDL HYPO 18GX1.5 BLUNT FILL (NEEDLE) IMPLANT
NDL HYPO 30X.5 LL (NEEDLE) ×2 IMPLANT
NDL PRECISIONGLIDE 27X1.5 (NEEDLE) IMPLANT
NEEDLE 18GX1X1/2 (RX/OR ONLY) (NEEDLE) ×2 IMPLANT
NEEDLE 25GX 5/8IN NON SAFETY (NEEDLE) ×6 IMPLANT
NEEDLE HYPO 18GX1.5 BLUNT FILL (NEEDLE) ×4 IMPLANT
NEEDLE HYPO 30X.5 LL (NEEDLE) ×4 IMPLANT
NEEDLE PRECISIONGLIDE 27X1.5 (NEEDLE) IMPLANT
NS IRRIG 1000ML POUR BTL (IV SOLUTION) ×2 IMPLANT
PACK VITRECTOMY CUSTOM (CUSTOM PROCEDURE TRAY) ×2 IMPLANT
PAD ARMBOARD 7.5X6 YLW CONV (MISCELLANEOUS) ×4 IMPLANT
PAK PIK VITRECTOMY CVS 25GA (OPHTHALMIC) ×2 IMPLANT
PENCIL BIPOLAR 25GA STR DISP (OPHTHALMIC RELATED) ×2 IMPLANT
PICK MICROPICK 25G (MISCELLANEOUS) IMPLANT
PROBE DIATHERMY DSP 27GA (MISCELLANEOUS) IMPLANT
PROBE ENDO DIATHERMY 25G (MISCELLANEOUS) IMPLANT
PROBE LASER ILLUM FLEX CVD 25G (OPHTHALMIC) IMPLANT
REPL STRA BRUSH NDL (NEEDLE) IMPLANT
REPL STRA BRUSH NEEDLE (NEEDLE) IMPLANT
RESERVOIR BACK FLUSH (MISCELLANEOUS) IMPLANT
RETRACTOR IRIS FLEX 25G GRIESH (INSTRUMENTS) IMPLANT
SET INJECTOR OIL FLUID CONSTEL (OPHTHALMIC) IMPLANT
SHIELD EYE LENSE ONLY DISP (GAUZE/BANDAGES/DRESSINGS) ×1 IMPLANT
SPONGE SURGIFOAM ABS GEL 12-7 (HEMOSTASIS) IMPLANT
STOPCOCK 4 WAY LG BORE MALE ST (IV SETS) IMPLANT
SUT VICRYL 7 0 TG140 8 (SUTURE) ×3 IMPLANT
SYR 10ML LL (SYRINGE) ×2 IMPLANT
SYR 20CC LL (SYRINGE) ×2 IMPLANT
SYR 5ML LL (SYRINGE) ×2 IMPLANT
SYR BULB 3OZ (MISCELLANEOUS) ×2 IMPLANT
SYR TB 1ML LUER SLIP (SYRINGE) ×5 IMPLANT
TOWEL NATURAL 6PK STERILE (DISPOSABLE) ×2 IMPLANT
TUBING HIGH PRESS EXTEN 6IN (TUBING) ×2 IMPLANT
WATER STERILE IRR 1000ML POUR (IV SOLUTION) ×2 IMPLANT

## 2018-04-12 NOTE — Discharge Instructions (Signed)
POSTOPERATIVE INSTRUCTIONS  Your doctor has performed vitreoretinal surgery on you at [facility].  - Keep eye patched and shielded until seen by Dr. Darthy Manganelli 10 AM tomorrow in clinic - Do not use drops until return - Keep head elevated while awake - Sleep with head elevated 30-45 degrees    - No strenuous bending, stooping or lifting.  - You may not drive until further notice.  - Tylenol or any other over-the-counter pain reliever can be used according to your doctor. If more pain medicine is required, your doctor will have a prescription for you.  - You may read, go up and down stairs, and watch television.     Isela Stantz, M.D., Ph.D.  

## 2018-04-12 NOTE — H&P (Signed)
Gloria Gomez is an 48 y.o. female.    Chief Complaint: Endophthalmitis, RIGHT EYE  HPI: Pt with history of PDR with vitreous hemorrhage s/p IVA OU on 04/09/2018. Presents today with eye pain, light sensitivity and decreased vision OD.  Past Medical History:  Diagnosis Date  . Abdominal bloating    likely from diab gastroparesis.  Dr. Havery Moros to do EGD as of 10/2017 GI eval.  . Benign brain tumor ([place])    Cystic lesion in cerebral aqueduct region with mild hydrocephalus-- stable MRI 02/2016.  Surveillance MRI 05/2017 --dilated cerebral aqueduct related to aqueductal stenosis and subsequent mild hydrocephalus (due to the 11 mm stable cystic lesion in cerebral aqueduct---?congenitial?.  . Dysmenorrhea    vicodin occ during first 2 days of cycle.  . Gluten intolerance    pt reports she underwent full GI w/u to r/o celiac dz  . Hepatic steatosis    ultrasound 08/2017  . Hyperlipidemia, mixed   . Hypertensive retinopathy of both eyes   . Proliferative diabetic retinopathy of both eyes ([place])    steroid injections 10/2017--improved  . Sensorineural hearing loss of left ear    Sudden left hearing loss summer 2016--no improvement with steroids 01/2015 so brain MRI done by Dr. Redmond Baseman and it showed brain tumor that was determined to be benign.  Pt's hearing not bad enough for hearing aid as of 06/2016.  . Type 2 diabetes with complication ([place])    diab retpthy, diabetic gastroparesis (gastric emptying study mildly abnl 03/2017)  . White coat hypertension     Past Surgical History:  Procedure Laterality Date  . CHOLECYSTECTOMY  2000  . GASTRIC EMPTYING SCAN  04/20/2017   Mildly abnormal, particularly the 1st hour of emptying.    Family History  Problem Relation Age of Onset  . Brain cancer Mother   . Diabetes Father   . Diabetes Maternal Grandmother   . Cataracts Maternal Grandmother   . Cervical cancer Paternal Grandmother   . Colon cancer Maternal Grandfather 50  . Amblyopia Neg Hx   .  Blindness Neg Hx   . Glaucoma Neg Hx   . Macular degeneration Neg Hx   . Retinal detachment Neg Hx   . Strabismus Neg Hx   . Retinitis pigmentosa Neg Hx    Social History:  reports that she has never smoked. She has never used smokeless tobacco. She reports that she does not drink alcohol or use drugs.  Allergies:  Allergies  Allergen Reactions  . Gluten Meal Swelling  . Lisinopril Cough  . Pioglitazone Other (See Comments)    ELEVATED glucoses + worse chronic nausea    Facility-Administered Medications Prior to Admission  Medication Dose Route Frequency Provider Last Rate Last Dose  . Bevacizumab (AVASTIN) SOLN 1.25 mg  1.25 mg Intravitreal  Bernarda Caffey, MD   1.25 mg at 11/18/17 0000  . Bevacizumab (AVASTIN) SOLN 1.25 mg  1.25 mg Intravitreal  Bernarda Caffey, MD   1.25 mg at 11/23/17 1250  . Bevacizumab (AVASTIN) SOLN 1.25 mg  1.25 mg Intravitreal  Bernarda Caffey, MD   1.25 mg at 12/23/17 2313  . Bevacizumab (AVASTIN) SOLN 1.25 mg  1.25 mg Intravitreal  Bernarda Caffey, MD   1.25 mg at 12/23/17 2314  . Bevacizumab (AVASTIN) SOLN 1.25 mg  1.25 mg Intravitreal  Bernarda Caffey, MD   1.25 mg at 01/14/18 2356  . Bevacizumab (AVASTIN) SOLN 1.25 mg  1.25 mg Intravitreal  Bernarda Caffey, MD   1.25 mg at 01/14/18 2356  .  Bevacizumab (AVASTIN) SOLN 1.25 mg  1.25 mg Intravitreal  Bernarda Caffey, MD   1.25 mg at 02/11/18 2358  . Bevacizumab (AVASTIN) SOLN 1.25 mg  1.25 mg Intravitreal  Bernarda Caffey, MD   1.25 mg at 02/11/18 2359  . Bevacizumab (AVASTIN) SOLN 1.25 mg  1.25 mg Intravitreal  Bernarda Caffey, MD   1.25 mg at 03/12/18 1633  . Bevacizumab (AVASTIN) SOLN 1.25 mg  1.25 mg Intravitreal  Bernarda Caffey, MD   1.25 mg at 03/12/18 1633  . Bevacizumab (AVASTIN) SOLN 1.25 mg  1.25 mg Intravitreal  Bernarda Caffey, MD   1.25 mg at 04/09/18 2321  . Bevacizumab (AVASTIN) SOLN 1.25 mg  1.25 mg Intravitreal  Bernarda Caffey, MD   1.25 mg at 04/09/18 2322   Medications Prior to Admission  Medication Sig  Dispense Refill  . glipiZIDE (GLUCOTROL XL) 10 MG 24 hr tablet TAKE 1 TABLET BY MOUTH WITH BREAKFAST 90 tablet 1  . HYDROcodone-acetaminophen (NORCO/VICODIN) 5-325 MG tablet Take 1-2 tablets by mouth every 6 (six) hours as needed for moderate pain. 40 tablet 0  . INVOKANA 300 MG TABS tablet TAKE 1 TABLET BY MOUTH ONCE DAILY BEFORE  BREAKFAST 90 tablet 1  . losartan (COZAAR) 100 MG tablet Take 1 tablet (100 mg total) by mouth daily. 90 tablet 1  . metFORMIN (GLUCOPHAGE-XR) 500 MG 24 hr tablet TAKE 2 TABLETS BY MOUTH TWICE DAILY 360 tablet 1  . pantoprazole (PROTONIX) 40 MG tablet TAKE 1 TABLET BY MOUTH ONCE DAILY 90 tablet 1  . promethazine (PHENERGAN) 12.5 MG tablet 1-2 tabs po q6h prn nausea 30 tablet 1  . temazepam (RESTORIL) 15 MG capsule Take 1 capsule (15 mg total) by mouth at bedtime as needed for sleep. 30 capsule 1    Review of systems otherwise negative  Blood pressure (!) 164/93, pulse (!) 107, temperature 98.5 F (36.9 C), temperature source Oral, resp. rate 18, height 5\' 4"  (1.626 m), weight 69.4 kg, SpO2 99 %.  Physical exam: Mental status: oriented x3. Eyes: See eye exam associated with this date of surgery Ears, Nose, Throat: within normal limits Neck: Within Normal limits General: within normal limits Chest: Within normal limits Breast: deferred Heart: Within normal limits Abdomen: Within normal limits GU: deferred Extremities: within normal limits Skin: within normal limits  Assessment/Plan 1. Endophthalmitis OD  Plan: To [facility] for emergent 25g PPV + intravitreal antibiotics OD under general anesthesia   Gardiner Sleeper, M.D., Ph.D. Vitreoretinal Surgeon [facility]

## 2018-04-12 NOTE — Anesthesia Postprocedure Evaluation (Signed)
Anesthesia Post Note  Patient: Gloria Gomez  Procedure(s) Performed: Right PARS PLANA VITRECTOMY WITH 25 GAUGE with intravitreal antibiotics (Right Eye)     Patient location during evaluation: PACU Anesthesia Type: General Level of consciousness: sedated Pain management: pain level controlled Vital Signs Assessment: post-procedure vital signs reviewed and stable Respiratory status: spontaneous breathing and respiratory function stable Cardiovascular status: stable Postop Assessment: no apparent nausea or vomiting Anesthetic complications: no    Last Vitals:  Vitals:   04/12/18 1606 04/12/18 1611  BP: 133/77 138/79  Pulse: 98 97  Resp: 15 19  Temp:  36.5 C  SpO2: 96% 96%    Last Pain:  Vitals:   04/12/18 1611  TempSrc:   PainSc: 0-No pain                 Rohil Lesch DANIEL

## 2018-04-12 NOTE — Anesthesia Preprocedure Evaluation (Addendum)
Anesthesia Evaluation  Patient identified by MRN, date of birth, ID band Patient awake    Reviewed: Allergy & Precautions, NPO status , Patient's Chart, lab work & pertinent test results  Airway Mallampati: II  TM Distance: >3 FB Neck ROM: Full    Dental no notable dental hx. (+) Dental Advisory Given   Pulmonary neg pulmonary ROS,    Pulmonary exam normal        Cardiovascular hypertension, Pt. on medications Normal cardiovascular exam     Neuro/Psych negative neurological ROS  negative psych ROS   GI/Hepatic negative GI ROS, Neg liver ROS,   Endo/Other  diabetes  Renal/GU negative Renal ROS  negative genitourinary   Musculoskeletal negative musculoskeletal ROS (+)   Abdominal   Peds negative pediatric ROS (+)  Hematology negative hematology ROS (+)   Anesthesia Other Findings   Reproductive/Obstetrics negative OB ROS                            Anesthesia Physical Anesthesia Plan  ASA: II  Anesthesia Plan: General   Post-op Pain Management:    Induction: Intravenous  PONV Risk Score and Plan: 4 or greater and Ondansetron, Dexamethasone, Scopolamine patch - Pre-op and Diphenhydramine  Airway Management Planned: Oral ETT  Additional Equipment:   Intra-op Plan:   Post-operative Plan: Extubation in OR  Informed Consent: I have reviewed the patients History and Physical, chart, labs and discussed the procedure including the risks, benefits and alternatives for the proposed anesthesia with the patient or authorized representative who has indicated his/her understanding and acceptance.     Dental advisory given  Plan Discussed with: CRNA and Anesthesiologist  Anesthesia Plan Comments:         Anesthesia Quick Evaluation

## 2018-04-12 NOTE — Transfer of Care (Signed)
Immediate Anesthesia Transfer of Care Note  Patient: Gloria Gomez  Procedure(s) Performed: Right PARS PLANA VITRECTOMY WITH 25 GAUGE with intravitreal antibiotics (Right Eye)  Patient Location: PACU  Anesthesia Type:General  Level of Consciousness: awake, alert  and oriented  Airway & Oxygen Therapy: Patient Spontanous Breathing and Patient connected to face mask oxygen  Post-op Assessment: Report given to RN and Post -op Vital signs reviewed and stable  Post vital signs: Reviewed and stable  Last Vitals:  Vitals Value Taken Time  BP 148/78   Temp    Pulse 87   Resp 14   SpO2 96     Last Pain:  Vitals:   04/12/18 1226  TempSrc:   PainSc: 0-No pain         Complications: No apparent anesthesia complications

## 2018-04-12 NOTE — Brief Op Note (Signed)
04/12/2018  3:30 PM  PATIENT:  Gloria Gomez  48 y.o. female  PRE-OPERATIVE DIAGNOSIS:  Endophthalmitis, right eye  POST-OPERATIVE DIAGNOSIS:  Endophthalmitis, right eye  PROCEDURE:  Procedure(s): Right PARS PLANA VITRECTOMY WITH 25 GAUGE with intravitreal antibiotics (Right)  SURGEON:  Surgeon(s) and Role:    Bernarda Caffey, MD - Primary  ASSISTANTS: none   ANESTHESIA:   general  EBL:  minimal  BLOOD ADMINISTERED:none  DRAINS: none   LOCAL MEDICATIONS USED:  NONE  SPECIMEN:  Source of Specimen:  Vitreous fluid, right eye  DISPOSITION OF SPECIMEN:  Micro lab for cultures  COUNTS:  YES  TOURNIQUET:  * No tourniquets in log *  DICTATION: .Note written in EPIC  PLAN OF CARE: Discharge to home after PACU  PATIENT DISPOSITION:  PACU - hemodynamically stable.   Delay start of Pharmacological VTE agent (>24hrs) due to surgical blood loss or risk of bleeding: not applicable

## 2018-04-12 NOTE — Anesthesia Procedure Notes (Signed)
Procedure Name: Intubation Date/Time: 04/12/2018 1:40 PM Performed by: Duane Boston, MD Pre-anesthesia Checklist: Patient identified, Emergency Drugs available, Suction available and Patient being monitored Patient Re-evaluated:Patient Re-evaluated prior to induction Oxygen Delivery Method: Circle System Utilized Preoxygenation: Pre-oxygenation with 100% oxygen Induction Type: IV induction Ventilation: Mask ventilation without difficulty Laryngoscope Size: Miller and 2 Grade View: Grade I Tube type: Oral Number of attempts: 1 Airway Equipment and Method: Stylet and Oral airway Placement Confirmation: ETT inserted through vocal cords under direct vision,  positive ETCO2 and breath sounds checked- equal and bilateral Secured at: 21 cm Tube secured with: Tape Dental Injury: Teeth and Oropharynx as per pre-operative assessment

## 2018-04-12 NOTE — Op Note (Signed)
Date of procedure:2.14.2020  Surgeon:Mick Tanguma Coralyn Pear, M.D., Ph.D  Pre-operative Diagnosis: Endophthalmitis, Right Eye  Post-operative diagnosis: Same  Anesthesia:General  Procedure: 1)  25 gauge pars plana vitrectomy w/ intravitreal antibiotics,Right Eye 2)  Intravitreal injection of antibiotics (Vancomycin, Ceftazidime, Cefepime), Right Eye   Complications: none Estimated blood loss: minimal Specimens: none  Brief history:The patient has a history of proliferative diabetic retinopathy with vitreous hemorrhage s/p intravitreal Avastin OU on 2.11.20. Pt developed decreased vision and eye pain in the right eye, and on examination, was noted to have endophthalmitis, right eye.The risks, benefits, and alternatives were explained to the patient, including pain, bleeding, infection, loss of vision, double vision, droopy eyelids, and need for more surgeries.Informed consent was obtained from the patient and placed in the chart.   Procedure: The patient was brought to the preoperative holding area where the correct eye was confirmed and marked.The patient was then brought to the operating room where anesthetic was given and general endotracheal anesthesia was induced by the Anesthesia team. A secondary time-out was performed to identify the correct patient, eye, procedure, and any allergies. The eye was prepped and draped in the usual sterile ophthalmic fashion followed by placement of a lid speculum.  A 25 gauge trocar was placed in the inferotemporal quadrant 4 mm posterior to the limbus in a beveled fashion. Two additional 25 gauge trocars were placed in the superonasal and superotemporal quadrants in a similar beveled fashion. A 4 mm infusion cannula was placed through the inferotemporal trocar but left clamped. The light pipe and cutter were introduced into the eye.An undiluted vitreous sample of about 1.5 cc was obtained using a 3 cc syringe attached to the vitrectomy  probe. The infusion was then turned on. Dense fibrin membrane and vitreous debris obscured the view of the retina.  A standard three-port pars plana vitrectomy was performed using the light pipe, the cutter, and the BIOM viewing system. Notably, there was abundant vitreous debris obscuring much of the view of the retina. As thorough of a core and peripheral vitreous dissection as was deemed safe was performed. Of note, there was a dense vitritis and the posterior pole had significant retinitis with scattered patches of retinal whitening. The retinal vessels were sclerotic and ischemic.  Compounded intravitreal antibiotics (vanc, ceftaz, and cefepime) were then injected through the superotemporal trocar. The trocars were then removed and sutured with 7-0 vicryl in an interrupted fashion. Subconjunctival injections ofantibiotic andDexamethasonewere administered. The lid speculum and drapes were removed. Drops of an antibioticandsteroid were given. The eye was patched and shielded. The patient tolerated the procedure well. The patient was taken to the recovery room in good condition. The patient was instructed to follow-up with Dr. Karrie Meres clinic on the following morning.

## 2018-04-12 NOTE — Progress Notes (Addendum)
[facility] Note  04/12/2018     CHIEF COMPLAINT Patient presents for Eye Problem   HISTORY OF PRESENT ILLNESS: Gloria Gomez is a 48 y.o. female who presents to the clinic today for:   HPI    Patient here for decrease of vision, light sensitivity, blurred va. Patient states vision terrible in OD. Can't see anything. Started last night gradually. Eye is light sensitive and watery. Eye hasn't been right since last visit 3 days ago. Blood sugar been good 138 this am after ate. Yesterday was 118 and day before 104.     Last edited by Bernarda Caffey, MD on 04/13/2018 10:17 AM. (History)    pt states when she left here on Tuesday she had a burning sensation that she assumed was from the injection received, she states it went away, so she didn't think anything about it, she states Wed her vision was more blurry than when she was in the office so she took it easy, she states it gradually got worse and she is extremely sensitive to light and her eye is watery and swollen  Referring physician: Tammi Sou, MD [address] [place], [place] [address]  HISTORICAL INFORMATION:   Selected notes from the MEDICAL RECORD NUMBER Referred for DM exam LEE:  Ocular Hx-NPDR PMH-DM (takes invokana and metformin), high cholesterol, benign brain tumor, white coat HTN    CURRENT MEDICATIONS: No current outpatient medications on file. (Ophthalmic Drugs)   No current facility-administered medications for this visit.  (Ophthalmic Drugs)   Current Outpatient Medications (Other)  Medication Sig  . glipiZIDE (GLUCOTROL XL) 10 MG 24 hr tablet TAKE 1 TABLET BY MOUTH WITH BREAKFAST  . HYDROcodone-acetaminophen (NORCO/VICODIN) 5-325 MG tablet Take 1-2 tablets by mouth every 6 (six) hours as needed for moderate pain.  . INVOKANA 300 MG TABS tablet TAKE 1 TABLET BY MOUTH ONCE DAILY BEFORE  BREAKFAST  . losartan (COZAAR) 100 MG tablet Take 1 tablet (100 mg total) by mouth  daily.  . metFORMIN (GLUCOPHAGE-XR) 500 MG 24 hr tablet TAKE 2 TABLETS BY MOUTH TWICE DAILY  . pantoprazole (PROTONIX) 40 MG tablet TAKE 1 TABLET BY MOUTH ONCE DAILY  . promethazine (PHENERGAN) 12.5 MG tablet 1-2 tabs po q6h prn nausea  . temazepam (RESTORIL) 15 MG capsule Take 1 capsule (15 mg total) by mouth at bedtime as needed for sleep.   No current facility-administered medications for this visit.  (Other)      REVIEW OF SYSTEMS: ROS    Positive for: Endocrine, Eyes   Negative for: Constitutional, Gastrointestinal, Neurological, Skin, Genitourinary, Musculoskeletal, HENT, Cardiovascular, Respiratory, Psychiatric, Allergic/Imm, Heme/Lymph   Last edited by Theodore Demark on 04/12/2018 10:40 AM. (History)       ALLERGIES Allergies  Allergen Reactions  . Gluten Meal Swelling  . Lisinopril Cough  . Pioglitazone Other (See Comments)    ELEVATED glucoses + worse chronic nausea    PAST MEDICAL HISTORY Past Medical History:  Diagnosis Date  . Abdominal bloating    likely from diab gastroparesis.  Dr. Havery Moros to do EGD as of 10/2017 GI eval.  . 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  Surveillance MRI 05/2017 --dilated cerebral aqueduct related to aqueductal stenosis and subsequent mild hydrocephalus (due to the 11 mm stable cystic lesion in cerebral aqueduct---?congenitial?.  . Dysmenorrhea    vicodin occ during first 2 days of cycle.  Marland Kitchen  Gluten intolerance    pt reports she underwent full GI w/u to r/o celiac dz  . Hepatic steatosis    ultrasound 08/2017  . Hyperlipidemia, mixed   . Hypertensive retinopathy of both eyes   . PONV (postoperative nausea and vomiting)   . Proliferative diabetic retinopathy of both eyes ([place])    steroid injections 10/2017--improved  . Sensorineural hearing loss of left ear    Sudden left hearing loss summer 2016--no improvement with steroids 01/2015 so brain MRI done  by Dr. Redmond Baseman and it showed brain tumor that was determined to be benign.  Pt's hearing not bad enough for hearing aid as of 06/2016.  . Type 2 diabetes with complication ([place])    diab retpthy, diabetic gastroparesis (gastric emptying study mildly abnl 03/2017)  . White coat hypertension    Past Surgical History:  Procedure Laterality Date  . CHOLECYSTECTOMY  2000  . GASTRIC EMPTYING SCAN  04/20/2017   Mildly abnormal, particularly the 1st hour of emptying.  Marland Kitchen PARS PLANA VITRECTOMY Right 04/12/2018   Procedure: Right PARS PLANA VITRECTOMY WITH 25 GAUGE with intravitreal antibiotics;  Surgeon: Bernarda Caffey, MD;  Location: [facility];  Service: Ophthalmology;  Laterality: Right;    FAMILY HISTORY Family History  Problem Relation Age of Onset  . Brain cancer Mother   . Diabetes Father   . Diabetes Maternal Grandmother   . Cataracts Maternal Grandmother   . Cervical cancer Paternal Grandmother   . Colon cancer Maternal Grandfather 19  . Amblyopia Neg Hx   . Blindness Neg Hx   . Glaucoma Neg Hx   . Macular degeneration Neg Hx   . Retinal detachment Neg Hx   . Strabismus Neg Hx   . Retinitis pigmentosa Neg Hx     SOCIAL HISTORY Social History   Tobacco Use  . Smoking status: Never Smoker  . Smokeless tobacco: Never Used  Substance Use Topics  . Alcohol use: No  . Drug use: No         OPHTHALMIC EXAM:  Base Eye Exam    Visual Acuity (Snellen - Linear)      Right Left   Dist cc CF at  2' 20/20 -1   Dist ph cc NI    Correction:  Glasses       Tonometry (Tonopen, 10:34 AM)      Right Left   Pressure 21 19       Pupils      Dark Light Shape React APD   Right 3 3 Round Minimal None   Left 3 2 Round Brisk None       Visual Fields (Counting fingers)      Left Right    Full Full       Extraocular Movement      Right Left    Full, Ortho Full, Ortho       Neuro/Psych    Oriented x3:  Yes   Mood/Affect:  Normal       Dilation    Both eyes:  1.0% Mydriacyl,  2.5% Phenylephrine @ 10:34 AM        Slit Lamp and Fundus Exam    Slit Lamp Exam      Right Left   Lids/Lashes Dermatochalasis - upper lid, Meibomian gland dysfunction, Telangiectasia Dermatochalasis - upper lid, Meibomian gland dysfunction, Telangiectasia   Conjunctiva/Sclera 1+ Injection White and quiet   Cornea 1+ Punctate epithelial erosions, 1+ Descemet's folds Clear   Anterior Chamber moderate depth, 4+ Cell, less  than 47mm hypopian Deep and quiet   Iris Round and dilated, No NVI Round and dilated, No NVI   Lens 2+ Nuclear sclerosis, 2+ Cortical cataract 2+ Nuclear sclerosis, 2+ Cortical cataract   Vitreous +WBC's Vitreous syneresis, residual VH settled inferiorly       Fundus Exam      Right Left   Disc very hazy view Regressed NVE and fibrosis, compact   C/D Ratio 0.1 0.1   Macula no view good foveal reflex, cluster of MA's and exudates temporal macula   Vessels Vascular attenuation, Tortuous Vascular attenuation, mild Tortuousity   Periphery Very hazy view, Attached temporally with PRP and sclerotic vessels Attached, scattered DBH, good 360 PRP laser changes, room for fill in posteriorly.           IMAGING AND PROCEDURES  Imaging and Procedures for @TODAY @  Anesthesia Airway          Procedure Name: Intubation Date/Time: 04/12/2018 1:40 PM Performed by: Duane Boston, MD Pre-anesthesia Checklist: Patient identified, Emergency Drugs available, Suction available and Patient being monitored Patient Re-evaluated:Patient Re-evaluated prior to induction Oxygen Delivery Method: Circle System Utilized Preoxygenation: Pre-oxygenation with 100% oxygen Induction Type: IV induction Ventilation: Mask ventilation without difficulty Laryngoscope Size: Miller and 2 Grade View: Grade I Tube type: Oral Number of attempts: 1 Airway Equipment and Method: Stylet and Oral airway Placement Confirmation: ETT inserted through vocal cords under direct vision,  positive ETCO2 and breath  sounds checked- equal and bilateral Secured at: 21 cm Tube secured with: Tape Dental Injury: Teeth and Oropharynx as per pre-operative assessment                CBC     Component Value Flag Ref Range Units Status   WBC 12.0    4.0 - 10.5 K/uL Final   RBC 4.77    3.87 - 5.11 MIL/uL Final   Hemoglobin 11.8    12.0 - 15.0 g/dL Final   HCT 38.8    36.0 - 46.0 % Final   MCV 81.3    80.0 - 100.0 fL Final   MCH 24.7    26.0 - 34.0 pg Final   MCHC 30.4    30.0 - 36.0 g/dL Final   RDW 15.9    11.5 - 15.5 % Final   Platelets 508    150 - 400 K/uL Final   nRBC 0.0    0.0 - 0.2 % Final   Comment:   Performed at [facility], [place] [address], [place], [place] [address]        Basic metabolic panel     Component Value Flag Ref Range Units Status   Sodium 137    135 - 145 mmol/L Final   Potassium 4.0    3.5 - 5.1 mmol/L Final   Chloride 109    98 - 111 mmol/L Final   CO2 17    22 - 32 mmol/L Final   Glucose, Bld 179    70 - 99 mg/dL Final   BUN 12    6 - 20 mg/dL Final   Creatinine, Ser 0.55    0.44 - 1.00 mg/dL Final   Calcium 9.2    8.9 - 10.3 mg/dL Final   GFR calc non Af Amer >60    >60 mL/min Final   GFR calc Af Amer >60    >60 mL/min Final   Anion gap 11    5 - 15  Final   Comment:  Performed at [facility], [place] [address], [place], [place] [address]        Hemoglobin A1c     Component Value Flag Ref Range Units Status   Hgb A1c MFr Bld 8.4    4.8 - 5.6 % Final   Comment:   (NOTE) Pre diabetes:          5.7%-6.4% Diabetes:              >6.4% Glycemic control for   <7.0% adults with diabetes    Mean Plasma Glucose 194.38     mg/dL Final   Comment:   Performed at [facility] [address], [place], [place] [address]        Glucose, capillary     Component Value Flag Ref Range Units Status   Glucose-Capillary 165    70 - 99 mg/dL Final        Pregnancy, urine POC     Component Value Flag Ref Range Units Status   Preg Test, Ur  NEGATIVE    NEGATIVE  Final   Comment:          THE SENSITIVITY OF THIS METHODOLOGY IS >24 mIU/mL         Glucose, capillary     Component Value Flag Ref Range Units Status   Glucose-Capillary 140    70 - 99 mg/dL Final        Eye culture     Specimen Information: Eye        Component   Specimen Description   EYE RIGHT    Special Requests   VITREOUS FLUID    Gram Stain   WBC PRESENT, PREDOMINANTLY PMN GRAM POSITIVE COCCI CYTOSPIN SMEAR Performed at [facility], [place] [address] [place], [place] [address]    Culture   PENDING    Report Status   PENDING         OCT, Retina - OU - Both Eyes       Right Eye Quality was poor. Progression has worsened.   Left Eye Quality was good. Central Foveal Thickness: 295. Progression has been stable. Findings include intraretinal fluid, no SRF, normal foveal contour, intraretinal hyper-reflective material, epiretinal membrane (Mildly improved IRF).   Notes *Images captured and stored on drive  Diagnosis / Impression:  OD: single line scan with IRHM OS: abnormal foveal contour; +IRF; preretinal fibrosis about the disc; stable/persistent cystic changes  Clinical management:  See below  Abbreviations: NFP - Normal foveal profile. CME - cystoid macular edema. PED - pigment epithelial detachment. IRF - intraretinal fluid. SRF - subretinal fluid. EZ - ellipsoid zone. ERM - epiretinal membrane. ORA - outer retinal atrophy. ORT - outer retinal tubulation. SRHM - subretinal hyper-reflective material                  ASSESSMENT/PLAN:    ICD-10-CM   1. Right endophthalmia H44.001   2. Vitreous hemorrhage of right eye ([place]) H43.11   3. Proliferative diabetic retinopathy of both eyes with macular edema associated with type 2 diabetes mellitus ([place]) F57.3220   4. Retinal edema H35.81 OCT, Retina - OU - Both Eyes  5. Hypertensive retinopathy of both eyes H35.033   6. Essential hypertension I10   7. Combined forms of  age-related cataract of both eyes H25.813      1. Endophthalmitis OD - s/p IVA OU 04/09/2018 - 2 day history of decreased vision and eye pain - BCVA CF - exam shows  significant vitritis and <1 mm hypopyon OD - discussed findings, guarded prognosis and treatment options - recommend emergent PPV w/ intravitreal antibiotics today under general anesthesia - RBA of procedure discussed, questions answered - informed consent obtained and signed - case booked for 1230 today -- [place] 08 - f/u tomorrow 02.15.20 for POD1  2. Vitreous Hemorrhage OD - secondary to PDR as described below - S/P IVA OD #1 (09.20.19), #2 (10.25.19), #3 (11.15.19), #4 (12.16.19), #5 (03/12/2018) - S/P PRP OD #1 (09.27.19), fill in OD (11.21.19) -- each somewhat limited by residual VH  3,4. Proliferative diabetic retinopathy w/ DME, OU - s/p IVA OD #1 9.20.19, #2 (10.25.19), #3 (11.15.19), #4 (12.17.19), #5 (01.14.20), #6 (2.11.20) - s/p IVA OS #1 9.27.19, #2 (10.25.19), #3 (11.15.19), #4 (12.17.19), #5 (01.14.20), #6 (2.11.20) - OS doing well -- VA stable at 20/25 - HbA1c 7.7% (10.2.19) from 10.3% (6.26.19) - FA (9.20.19) shows +NVE OU and leaking MA and capillary nonperfusion - repeat FA 11.15.19 shows NV regressing OU - OCT confirms diabetic macular edema OU -- improving - S/P PRP OS (09.20.19) - S/P PRP OD (9.27.19 and 11.21.19) - inferior periphery obscured by VH  5,6. Hypertensive retinopathy OU - discussed importance of tight BP control. - monitor for now  7. Combined form age related cataract OU-  - The symptoms of cataract, surgical options, and treatments and risks were discussed with patient. - discussed diagnosis and progression - not yet visually significant - monitor for now   Ophthalmic Meds Ordered this visit:  No orders of the defined types were placed in this encounter.      Return in about 1 day (around 04/13/2018) for f/u Endophthalmitis OD.  There are no Patient Instructions on file  for this visit.   Explained the diagnoses, plan, and follow up with the patient and they expressed understanding.  Patient expressed understanding of the importance of proper follow up care.   This document serves as a record of services personally performed by Gardiner Sleeper, MD, PhD. It was created on their behalf by Ernest Mallick, OA, an ophthalmic assistant. The creation of this record is the provider's dictation and/or activities during the visit.    Electronically signed by: Ernest Mallick, OA  02.14.2020 10:31 AM   Gardiner Sleeper, M.D., Ph.D. Diseases & Surgery of the Retina and Vitreous [facility]  I have reviewed the above documentation for accuracy and completeness, and I agree with the above. Gardiner Sleeper, M.D., Ph.D. 04/13/18 10:31 AM    Abbreviations: M myopia (nearsighted); A astigmatism; H hyperopia (farsighted); P presbyopia; Mrx spectacle prescription;  CTL contact lenses; OD right eye; OS left eye; OU both eyes  XT exotropia; ET esotropia; PEK punctate epithelial keratitis; PEE punctate epithelial erosions; DES dry eye syndrome; MGD meibomian gland dysfunction; ATs artificial tears; PFAT's preservative free artificial tears; [place] nuclear sclerotic cataract; PSC posterior subcapsular cataract; ERM epi-retinal membrane; PVD posterior vitreous detachment; RD retinal detachment; DM diabetes mellitus; DR diabetic retinopathy; NPDR non-proliferative diabetic retinopathy; PDR proliferative diabetic retinopathy; CSME clinically significant macular edema; DME diabetic macular edema; dbh dot blot hemorrhages; CWS cotton wool spot; POAG primary open angle glaucoma; C/D cup-to-disc ratio; HVF humphrey visual field; GVF goldmann visual field; OCT optical coherence tomography; IOP intraocular pressure; BRVO Branch retinal vein occlusion; CRVO central retinal vein occlusion; CRAO central retinal artery occlusion; BRAO branch retinal artery occlusion; RT retinal tear; SB  scleral buckle; PPV pars plana vitrectomy; VH Vitreous hemorrhage; PRP  panretinal laser photocoagulation; IVK intravitreal kenalog; VMT vitreomacular traction; MH Macular hole;  NVD neovascularization of the disc; NVE neovascularization elsewhere; AREDS age related eye disease study; ARMD age related macular degeneration; POAG primary open angle glaucoma; EBMD epithelial/anterior basement membrane dystrophy; ACIOL anterior chamber intraocular lens; IOL intraocular lens; PCIOL posterior chamber intraocular lens; Phaco/IOL phacoemulsification with intraocular lens placement; [place] photorefractive keratectomy; LASIK laser assisted in situ keratomileusis; HTN hypertension; DM diabetes mellitus; COPD chronic obstructive pulmonary disease

## 2018-04-13 ENCOUNTER — Encounter (HOSPITAL_COMMUNITY): Payer: Self-pay | Admitting: Ophthalmology

## 2018-04-13 ENCOUNTER — Encounter (INDEPENDENT_AMBULATORY_CARE_PROVIDER_SITE_OTHER): Payer: Self-pay | Admitting: Ophthalmology

## 2018-04-13 ENCOUNTER — Ambulatory Visit (INDEPENDENT_AMBULATORY_CARE_PROVIDER_SITE_OTHER): Payer: BLUE CROSS/BLUE SHIELD | Admitting: Ophthalmology

## 2018-04-13 DIAGNOSIS — H44001 Unspecified purulent endophthalmitis, right eye: Secondary | ICD-10-CM

## 2018-04-13 DIAGNOSIS — H4311 Vitreous hemorrhage, right eye: Secondary | ICD-10-CM

## 2018-04-13 DIAGNOSIS — H25813 Combined forms of age-related cataract, bilateral: Secondary | ICD-10-CM

## 2018-04-13 DIAGNOSIS — I10 Essential (primary) hypertension: Secondary | ICD-10-CM

## 2018-04-13 DIAGNOSIS — H35033 Hypertensive retinopathy, bilateral: Secondary | ICD-10-CM

## 2018-04-13 DIAGNOSIS — E113513 Type 2 diabetes mellitus with proliferative diabetic retinopathy with macular edema, bilateral: Secondary | ICD-10-CM

## 2018-04-13 DIAGNOSIS — H3581 Retinal edema: Secondary | ICD-10-CM

## 2018-04-13 NOTE — Progress Notes (Signed)
[facility] Note  04/13/2018     CHIEF COMPLAINT Patient presents for Post-op Follow-up   HISTORY OF PRESENT ILLNESS: Gloria Gomez is a 48 y.o. female who presents to the clinic today for:   HPI    Post-op Follow-up    In right eye.  Discomfort includes foreign body sensation.  Vision is stable.  I, the attending physician,  performed the HPI with the patient and updated documentation appropriately.          Comments    POD1 s/p PPV + intravitreal ABX OD, 2.14.2020. Mild FBS today. Did well overnight.       Last edited by Bernarda Caffey, MD on 04/13/2018 10:44 AM. (History)      Referring physician: Tammi Sou, MD [address] [place], [place] [address]  HISTORICAL INFORMATION:   Selected notes from the MEDICAL RECORD NUMBER Referred for DM exam LEE:  Ocular Hx-NPDR PMH-DM (takes invokana and metformin), high cholesterol, benign brain tumor, white coat HTN    CURRENT MEDICATIONS: No current outpatient medications on file. (Ophthalmic Drugs)   No current facility-administered medications for this visit.  (Ophthalmic Drugs)   Current Outpatient Medications (Other)  Medication Sig  . glipiZIDE (GLUCOTROL XL) 10 MG 24 hr tablet TAKE 1 TABLET BY MOUTH WITH BREAKFAST  . HYDROcodone-acetaminophen (NORCO/VICODIN) 5-325 MG tablet Take 1-2 tablets by mouth every 6 (six) hours as needed for moderate pain.  . INVOKANA 300 MG TABS tablet TAKE 1 TABLET BY MOUTH ONCE DAILY BEFORE  BREAKFAST  . losartan (COZAAR) 100 MG tablet Take 1 tablet (100 mg total) by mouth daily.  . metFORMIN (GLUCOPHAGE-XR) 500 MG 24 hr tablet TAKE 2 TABLETS BY MOUTH TWICE DAILY  . pantoprazole (PROTONIX) 40 MG tablet TAKE 1 TABLET BY MOUTH ONCE DAILY  . promethazine (PHENERGAN) 12.5 MG tablet 1-2 tabs po q6h prn nausea  . temazepam (RESTORIL) 15 MG capsule Take 1 capsule (15 mg total) by mouth at bedtime as needed for sleep.   No current facility-administered  medications for this visit.  (Other)      REVIEW OF SYSTEMS: ROS    Positive for: Eyes   Negative for: Constitutional, Gastrointestinal, Neurological, Skin, Genitourinary, Musculoskeletal, HENT, Endocrine, Cardiovascular, Respiratory, Psychiatric, Allergic/Imm, Heme/Lymph   Last edited by Bernarda Caffey, MD on 04/13/2018 10:37 AM. (History)       ALLERGIES Allergies  Allergen Reactions  . Gluten Meal Swelling  . Lisinopril Cough  . Pioglitazone Other (See Comments)    ELEVATED glucoses + worse chronic nausea    PAST MEDICAL HISTORY Past Medical History:  Diagnosis Date  . Abdominal bloating    likely from diab gastroparesis.  Dr. Havery Moros to do EGD as of 10/2017 GI eval.  . Benign brain tumor ([place])    Cystic lesion in cerebral aqueduct region with mild hydrocephalus-- stable MRI 02/2016.  Surveillance MRI 05/2017 --dilated cerebral aqueduct related to aqueductal stenosis and subsequent mild hydrocephalus (due to the 11 mm stable cystic lesion in cerebral aqueduct---?congenitial?.  . Dysmenorrhea    vicodin occ during first 2 days of cycle.  . Gluten intolerance    pt reports she underwent full GI w/u to r/o celiac dz  . Hepatic steatosis    ultrasound 08/2017  . Hyperlipidemia, mixed   . Hypertensive retinopathy of both eyes   . PONV (postoperative nausea and vomiting)   . Proliferative diabetic retinopathy of both eyes ([place])    steroid injections 10/2017--improved  .  Sensorineural hearing loss of left ear    Sudden left hearing loss summer 2016--no improvement with steroids 01/2015 so brain MRI done by Dr. Redmond Baseman and it showed brain tumor that was determined to be benign.  Pt's hearing not bad enough for hearing aid as of 06/2016.  . Type 2 diabetes with complication ([place])    diab retpthy, diabetic gastroparesis (gastric emptying study mildly abnl 03/2017)  . White coat hypertension    Past Surgical History:  Procedure Laterality Date  . CHOLECYSTECTOMY  2000  . GASTRIC  EMPTYING SCAN  04/20/2017   Mildly abnormal, particularly the 1st hour of emptying.  Marland Kitchen PARS PLANA VITRECTOMY Right 04/12/2018   Procedure: Right PARS PLANA VITRECTOMY WITH 25 GAUGE with intravitreal antibiotics;  Surgeon: Bernarda Caffey, MD;  Location: [facility];  Service: Ophthalmology;  Laterality: Right;    FAMILY HISTORY Family History  Problem Relation Age of Onset  . Brain cancer Mother   . Diabetes Father   . Diabetes Maternal Grandmother   . Cataracts Maternal Grandmother   . Cervical cancer Paternal Grandmother   . Colon cancer Maternal Grandfather 63  . Amblyopia Neg Hx   . Blindness Neg Hx   . Glaucoma Neg Hx   . Macular degeneration Neg Hx   . Retinal detachment Neg Hx   . Strabismus Neg Hx   . Retinitis pigmentosa Neg Hx     SOCIAL HISTORY Social History   Tobacco Use  . Smoking status: Never Smoker  . Smokeless tobacco: Never Used  Substance Use Topics  . Alcohol use: No  . Drug use: No         OPHTHALMIC EXAM:  Base Eye Exam    Visual Acuity (Snellen - Linear)      Right Left   Dist cc CF 2'    Correction:  Glasses       Tonometry (Tonopen, 9:58 AM)      Right Left   Pressure 13 14       Pupils      Dark   Right 5   Left        Neuro/Psych    Oriented x3:  Yes   Mood/Affect:  Normal        Slit Lamp and Fundus Exam    Slit Lamp Exam      Right Left   Lids/Lashes Dermatochalasis - upper lid, Meibomian gland dysfunction, Telangiectasia Dermatochalasis - upper lid, Meibomian gland dysfunction, Telangiectasia   Conjunctiva/Sclera 1+ Injection; mild [place]; sutures intact White and quiet   Cornea 1+ Punctate epithelial erosions, 3-4+ Descemet's folds Clear   Anterior Chamber moderate depth, 4+ Cell, 107mm hypopyon Deep and quiet   Iris Round and dilated, No NVI Round and dilated, No NVI   Lens 2+ Nuclear sclerosis, 2+ Cortical cataract 2+ Nuclear sclerosis, 2+ Cortical cataract   Vitreous post vitrectomy; no view Vitreous syneresis, residual VH  settled inferiorly       Fundus Exam      Right Left   Disc no view Regressed NVE and fibrosis, compact   C/D Ratio  0.1   Macula no view good foveal reflex, cluster of MA's and exudates temporal macula   Vessels no view Vascular attenuation, mild Tortuousity   Periphery red reflex Attached, scattered DBH, good 360 PRP laser changes, room for fill in posteriorly.           IMAGING AND PROCEDURES  Imaging and Procedures for @TODAY @  ASSESSMENT/PLAN:    ICD-10-CM   1. Right endophthalmia H44.001   2. Vitreous hemorrhage of right eye ([place]) H43.11   3. Proliferative diabetic retinopathy of both eyes with macular edema associated with type 2 diabetes mellitus ([place]) Y78.2956   4. Retinal edema H35.81   5. Hypertensive retinopathy of both eyes H35.033   6. Essential hypertension I10   7. Combined forms of age-related cataract of both eyes H25.813      1. Endophthalmitis OD - s/p IVA OU 04/09/2018 - POD1 s/p 25g PPV w/ intravitreal vanc, ceftaz and cefepime OD, 2.14.2020             - pain controlled             - significant inflammation and +corneal edema             - IOP 13             - cultures from vitreous pending -- gram stain shows G+ cocci             - start  PF q1h OD                         zymaxid QID OD                         Atropine BID OD                         Brimonidine BID OD                         PSO ung QID OD             - eye shield when sleeping; keep head elevated             - post op drop instructions reviewed             - tylenol/ibuprofen for pain             - discussed guarded prognosis and possible need for additional intravitreal antibiotics - f/u tomorrow 04/14/2018 for POD2  2. Vitreous Hemorrhage OD - secondary to PDR as described below - S/P IVA OD #1 (09.20.19), #2 (10.25.19), #3 (11.15.19), #4 (12.16.19), #5 (03/12/2018) - S/P PRP OD #1 (09.27.19), fill in OD (11.21.19) -- each somewhat limited by  residual VH  3,4. Proliferative diabetic retinopathy w/ DME, OU - s/p IVA OD #1 9.20.19, #2 (10.25.19), #3 (11.15.19), #4 (12.17.19), #5 (01.14.20), #6 (2.11.20) - s/p IVA OS #1 9.27.19, #2 (10.25.19), #3 (11.15.19), #4 (12.17.19), #5 (01.14.20), #6 (2.11.20) - OS doing well -- VA stable at 20/25 - HbA1c 7.7% (10.2.19) from 10.3% (6.26.19) - FA (9.20.19) shows +NVE OU and leaking MA and capillary nonperfusion - repeat FA 11.15.19 shows NV regressing OU - OCT confirms diabetic macular edema OU -- improving - S/P PRP OS (09.20.19) - S/P PRP OD (9.27.19 and 11.21.19) - inferior periphery obscured by VH  5,6. Hypertensive retinopathy OU - discussed importance of tight BP control. - monitor for now  7. Combined form age related cataract OU-  - The symptoms of cataract, surgical options, and treatments and risks were discussed with patient. - discussed diagnosis and progression - not yet visually significant - monitor for now   Ophthalmic Meds Ordered this visit:  No orders of the defined types were placed in this  encounter.      Return in about 1 day (around 04/14/2018) for POV.  There are no Patient Instructions on file for this visit.   Explained the diagnoses, plan, and follow up with the patient and they expressed understanding.  Patient expressed understanding of the importance of proper follow up care.    Gardiner Sleeper, M.D., Ph.D. Diseases & Surgery of the Retina and Vitreous [facility]  I have reviewed the above documentation for accuracy and completeness, and I agree with the above. Gardiner Sleeper, M.D., Ph.D. 04/13/18 10:45 AM   Abbreviations: M myopia (nearsighted); A astigmatism; H hyperopia (farsighted); P presbyopia; Mrx spectacle prescription;  CTL contact lenses; OD right eye; OS left eye; OU both eyes  XT exotropia; ET esotropia; PEK punctate epithelial keratitis; PEE punctate epithelial erosions; DES dry eye syndrome; MGD meibomian  gland dysfunction; ATs artificial tears; PFAT's preservative free artificial tears; [place] nuclear sclerotic cataract; PSC posterior subcapsular cataract; ERM epi-retinal membrane; PVD posterior vitreous detachment; RD retinal detachment; DM diabetes mellitus; DR diabetic retinopathy; NPDR non-proliferative diabetic retinopathy; PDR proliferative diabetic retinopathy; CSME clinically significant macular edema; DME diabetic macular edema; dbh dot blot hemorrhages; CWS cotton wool spot; POAG primary open angle glaucoma; C/D cup-to-disc ratio; HVF humphrey visual field; GVF goldmann visual field; OCT optical coherence tomography; IOP intraocular pressure; BRVO Branch retinal vein occlusion; CRVO central retinal vein occlusion; CRAO central retinal artery occlusion; BRAO branch retinal artery occlusion; RT retinal tear; SB scleral buckle; PPV pars plana vitrectomy; VH Vitreous hemorrhage; PRP panretinal laser photocoagulation; IVK intravitreal kenalog; VMT vitreomacular traction; MH Macular hole;  NVD neovascularization of the disc; NVE neovascularization elsewhere; AREDS age related eye disease study; ARMD age related macular degeneration; POAG primary open angle glaucoma; EBMD epithelial/anterior basement membrane dystrophy; ACIOL anterior chamber intraocular lens; IOL intraocular lens; PCIOL posterior chamber intraocular lens; Phaco/IOL phacoemulsification with intraocular lens placement; [place] photorefractive keratectomy; LASIK laser assisted in situ keratomileusis; HTN hypertension; DM diabetes mellitus; COPD chronic obstructive pulmonary disease

## 2018-04-14 ENCOUNTER — Ambulatory Visit (INDEPENDENT_AMBULATORY_CARE_PROVIDER_SITE_OTHER): Payer: BLUE CROSS/BLUE SHIELD | Admitting: Ophthalmology

## 2018-04-14 ENCOUNTER — Encounter (INDEPENDENT_AMBULATORY_CARE_PROVIDER_SITE_OTHER): Payer: Self-pay | Admitting: Ophthalmology

## 2018-04-14 DIAGNOSIS — H44001 Unspecified purulent endophthalmitis, right eye: Secondary | ICD-10-CM

## 2018-04-14 DIAGNOSIS — H3581 Retinal edema: Secondary | ICD-10-CM

## 2018-04-14 DIAGNOSIS — I10 Essential (primary) hypertension: Secondary | ICD-10-CM

## 2018-04-14 DIAGNOSIS — E113513 Type 2 diabetes mellitus with proliferative diabetic retinopathy with macular edema, bilateral: Secondary | ICD-10-CM

## 2018-04-14 DIAGNOSIS — H35033 Hypertensive retinopathy, bilateral: Secondary | ICD-10-CM

## 2018-04-14 DIAGNOSIS — H4311 Vitreous hemorrhage, right eye: Secondary | ICD-10-CM

## 2018-04-14 DIAGNOSIS — H25813 Combined forms of age-related cataract, bilateral: Secondary | ICD-10-CM

## 2018-04-14 LAB — EYE CULTURE

## 2018-04-14 MED ORDER — CIPROFLOXACIN HCL 500 MG PO TABS: 500.0000 mg | ORAL_TABLET | Freq: Two times a day (BID) | ORAL | 0 refills | Status: AC

## 2018-04-14 MED ORDER — PREDNISONE 10 MG PO TABS: 40.0000 mg | ORAL_TABLET | Freq: Every day | ORAL | 0 refills | Status: DC

## 2018-04-14 NOTE — Progress Notes (Signed)
[facility] Note  04/14/2018     CHIEF COMPLAINT Patient presents for Post-op Follow-up   HISTORY OF PRESENT ILLNESS: Gloria Gomez is a 48 y.o. female who presents to the clinic today for:   HPI    Post-op Follow-up    In right eye.  Discomfort includes foreign body sensation.  Negative for pain.  Vision is blurred at distance and is blurred at near.  I, the attending physician,  performed the HPI with the patient and updated documentation appropriately.          Comments    POD2 s/p PPV       Last edited by Bernarda Caffey, MD on 04/14/2018 11:03 AM. (History)      Referring physician: Tammi Sou, MD [address] [place], [place] [address]  HISTORICAL INFORMATION:   Selected notes from the MEDICAL RECORD NUMBER Referred for DM exam LEE:  Ocular Hx-NPDR PMH-DM (takes invokana and metformin), high cholesterol, benign brain tumor, white coat HTN    CURRENT MEDICATIONS: No current outpatient medications on file. (Ophthalmic Drugs)   No current facility-administered medications for this visit.  (Ophthalmic Drugs)   Current Outpatient Medications (Other)  Medication Sig  . ciprofloxacin (CIPRO) 500 MG tablet Take 1 tablet (500 mg total) by mouth 2 (two) times daily for 10 days.  Marland Kitchen glipiZIDE (GLUCOTROL XL) 10 MG 24 hr tablet TAKE 1 TABLET BY MOUTH WITH BREAKFAST  . HYDROcodone-acetaminophen (NORCO/VICODIN) 5-325 MG tablet Take 1-2 tablets by mouth every 6 (six) hours as needed for moderate pain.  . INVOKANA 300 MG TABS tablet TAKE 1 TABLET BY MOUTH ONCE DAILY BEFORE  BREAKFAST  . losartan (COZAAR) 100 MG tablet Take 1 tablet (100 mg total) by mouth daily.  . metFORMIN (GLUCOPHAGE-XR) 500 MG 24 hr tablet TAKE 2 TABLETS BY MOUTH TWICE DAILY  . pantoprazole (PROTONIX) 40 MG tablet TAKE 1 TABLET BY MOUTH ONCE DAILY  . predniSONE (DELTASONE) 10 MG tablet Take 4 tablets (40 mg total) by mouth daily.  . promethazine (PHENERGAN) 12.5 MG  tablet 1-2 tabs po q6h prn nausea  . temazepam (RESTORIL) 15 MG capsule Take 1 capsule (15 mg total) by mouth at bedtime as needed for sleep.   No current facility-administered medications for this visit.  (Other)      REVIEW OF SYSTEMS: ROS    Positive for: Eyes   Negative for: Constitutional, Gastrointestinal, Neurological, Skin, Genitourinary, Musculoskeletal, HENT, Endocrine, Cardiovascular, Respiratory, Psychiatric, Allergic/Imm, Heme/Lymph   Last edited by Bernarda Caffey, MD on 04/14/2018 10:09 AM. (History)       ALLERGIES Allergies  Allergen Reactions  . Gluten Meal Swelling  . Lisinopril Cough  . Pioglitazone Other (See Comments)    ELEVATED glucoses + worse chronic nausea    PAST MEDICAL HISTORY Past Medical History:  Diagnosis Date  . Abdominal bloating    likely from diab gastroparesis.  Dr. Havery Moros to do EGD as of 10/2017 GI eval.  . Benign brain tumor ([place])    Cystic lesion in cerebral aqueduct region with mild hydrocephalus-- stable MRI 02/2016.  Surveillance MRI 05/2017 --dilated cerebral aqueduct related to aqueductal stenosis and subsequent mild hydrocephalus (due to the 11 mm stable cystic lesion in cerebral aqueduct---?congenitial?.  . Dysmenorrhea    vicodin occ during first 2 days of cycle.  . Gluten intolerance    pt reports she underwent full GI w/u to r/o celiac dz  . Hepatic steatosis    ultrasound 08/2017  .  Hyperlipidemia, mixed   . Hypertensive retinopathy of both eyes   . PONV (postoperative nausea and vomiting)   . Proliferative diabetic retinopathy of both eyes ([place])    steroid injections 10/2017--improved  . Sensorineural hearing loss of left ear    Sudden left hearing loss summer 2016--no improvement with steroids 01/2015 so brain MRI done by Dr. Redmond Baseman and it showed brain tumor that was determined to be benign.  Pt's hearing not bad enough for hearing aid as of 06/2016.  . Type 2 diabetes with complication ([place])    diab retpthy, diabetic  gastroparesis (gastric emptying study mildly abnl 03/2017)  . White coat hypertension    Past Surgical History:  Procedure Laterality Date  . CHOLECYSTECTOMY  2000  . GASTRIC EMPTYING SCAN  04/20/2017   Mildly abnormal, particularly the 1st hour of emptying.  Marland Kitchen PARS PLANA VITRECTOMY Right 04/12/2018   Procedure: Right PARS PLANA VITRECTOMY WITH 25 GAUGE with intravitreal antibiotics;  Surgeon: Bernarda Caffey, MD;  Location: [facility];  Service: Ophthalmology;  Laterality: Right;    FAMILY HISTORY Family History  Problem Relation Age of Onset  . Brain cancer Mother   . Diabetes Father   . Diabetes Maternal Grandmother   . Cataracts Maternal Grandmother   . Cervical cancer Paternal Grandmother   . Colon cancer Maternal Grandfather 25  . Amblyopia Neg Hx   . Blindness Neg Hx   . Glaucoma Neg Hx   . Macular degeneration Neg Hx   . Retinal detachment Neg Hx   . Strabismus Neg Hx   . Retinitis pigmentosa Neg Hx     SOCIAL HISTORY Social History   Tobacco Use  . Smoking status: Never Smoker  . Smokeless tobacco: Never Used  Substance Use Topics  . Alcohol use: No  . Drug use: No         OPHTHALMIC EXAM:  Base Eye Exam    Visual Acuity (Snellen - Linear)      Right Left   Dist cc HM        Tonometry (Tonopen, 10:10 AM)      Right Left   Pressure 16 20       Pupils      Dark Light Shape React   Right 5 5 Round none   Left           Neuro/Psych    Oriented x3:  Yes   Mood/Affect:  Normal        Slit Lamp and Fundus Exam    Slit Lamp Exam      Right Left   Lids/Lashes Dermatochalasis - upper lid, Meibomian gland dysfunction, Telangiectasia Dermatochalasis - upper lid, Meibomian gland dysfunction, Telangiectasia   Conjunctiva/Sclera tr Injection; mild [place]; sutures intact White and quiet   Cornea 1+ Punctate epithelial erosions, 3-4+ Descemet's folds Clear   Anterior Chamber moderate depth, 4+ Cell, 1.25 mm hypopyon Deep and quiet   Iris Round and dilated, No  NVI Round and dilated, No NVI   Lens 2+ Nuclear sclerosis, 2+ Cortical cataract 2+ Nuclear sclerosis, 2+ Cortical cataract   Vitreous post vitrectomy; no view Vitreous syneresis, residual VH settled inferiorly       Fundus Exam      Right Left   Disc no view Regressed NVE and fibrosis, compact   C/D Ratio  0.1   Macula no view good foveal reflex, cluster of MA's and exudates temporal macula   Vessels no view Vascular attenuation, mild Tortuousity   Periphery red reflex  Attached, scattered DBH, good 360 PRP laser changes, room for fill in posteriorly.           IMAGING AND PROCEDURES  Imaging and Procedures for @TODAY @  PROCEDURE NOTE  PREOPERATIVE DIAGNOSIS:      Endophthalmitis, right eye  PROCEDURE:  Intravitreal Tap and Inject, right eye                    SURGEON:                Bernarda Caffey, MD, PhD  ANESTHESIA:      Topical; Sub-conjunctival block                          PROCEDURE:     The treatment options for Endopthalmitis were discussed with the patient including the EVS.  The patient signed the informed consent document and was taken to the procedure room.  The eye was marked and a time out was performed identifying the correct eye.                                  After successful induction of topical anesthesia with proparacaine drops, 2% lidocaine was injected into the superotemporal subconjunctival space and allowed to take effect. The eye was then prepped w/ Betadine.  Subsequently, ~0.3 mL of vitreous was removed from the eye using a 0.5-inch, 27g needle on a 27mL syringe, inserted ~59mm posterior to the limbus through the pars plana. 0.69mL of vancomycin (1 mg/0.60mL), and ceftazidime (2.25 mg/0.1 mL) were each injected into the vitreous cavity utilizing seprarate 30-gauge needles. Betadine was applied to the injection sites, then subsequently rinsed from the eye. Then, one drop of besivance was instilled into the right eye. There were no complications of the injection.  The patient had no complications and the discharge instructions were reviewed.        ASSESSMENT/PLAN:    ICD-10-CM   1. Right endophthalmia H44.001 Vitreous Tap - OD - Right Eye    Eye culture    CANCELED: Intravitreal Injection, Pharmacologic Agent - OD - Right Eye    CANCELED: Intravitreal Injection, Pharmacologic Agent - OD - Right Eye  2. Vitreous hemorrhage of right eye ([place]) H43.11   3. Proliferative diabetic retinopathy of both eyes with macular edema associated with type 2 diabetes mellitus ([place]) B76.2831   4. Retinal edema H35.81   5. Hypertensive retinopathy of both eyes H35.033   6. Essential hypertension I10   7. Combined forms of age-related cataract of both eyes H25.813      1. Endophthalmitis OD - s/p IVA OU 04/09/2018 - POD2 s/p 25g PPV w/ intravitreal vanc, ceftaz and cefepime OD, 2.14.2020             - pain controlled             - significant inflammation and +corneal edema             - IOP 13             - gram stain shows G+ cocci, WBCs mostly PMNs             - cultures from vitreous growing rare Staph warneri             - mild increase in hypopyon             - recommend intravitreal tap  and inject today -- vanc and ceftaz             - RBA of procedure discussed, questions answered             - informed consent obtained and signed             - see procedure note -- pt tolerated well             - vitreous tap brought to [facility]             - cont  PF q1h OD                         zymaxid QID OD                         Atropine BID OD                         Brimonidine BID OD                         PSO ung QID OD             - start PO cipro 500 mg BID x 10 days             - start PO prednisone 40 mg daily             - eye shield when sleeping; keep head elevated             - post op drop instructions reviewed             - tylenol/ibuprofen for pain             - discussed guarded prognosis and possible need for  additional intravitreal antibiotics - f/u Tuesday 04/16/2018 for POD4  2. Vitreous Hemorrhage OD - secondary to PDR as described below - S/P IVA OD #1 (09.20.19), #2 (10.25.19), #3 (11.15.19), #4 (12.16.19), #5 (03/12/2018) - S/P PRP OD #1 (09.27.19), fill in OD (11.21.19) -- each somewhat limited by residual VH  3,4. Proliferative diabetic retinopathy w/ DME, OU - s/p IVA OD #1 9.20.19, #2 (10.25.19), #3 (11.15.19), #4 (12.17.19), #5 (01.14.20), #6 (2.11.20) - s/p IVA OS #1 9.27.19, #2 (10.25.19), #3 (11.15.19), #4 (12.17.19), #5 (01.14.20), #6 (2.11.20) - OS doing well -- VA stable at 20/25 - HbA1c 7.7% (10.2.19) from 10.3% (6.26.19) - FA (9.20.19) shows +NVE OU and leaking MA and capillary nonperfusion - repeat FA 11.15.19 shows NV regressing OU - OCT confirms diabetic macular edema OU -- improving - S/P PRP OS (09.20.19) - S/P PRP OD (9.27.19 and 11.21.19) - inferior periphery obscured by VH  5,6. Hypertensive retinopathy OU - discussed importance of tight BP control. - monitor for now  7. Combined form age related cataract OU-  - The symptoms of cataract, surgical options, and treatments and risks were discussed with patient. - discussed diagnosis and progression - not yet visually significant - monitor for now   Ophthalmic Meds Ordered this visit:  Meds ordered this encounter  Medications  . ciprofloxacin (CIPRO) 500 MG tablet    Sig: Take 1 tablet (500 mg total) by mouth 2 (two) times daily for 10 days.    Dispense:  20 tablet    Refill:  0  . predniSONE (DELTASONE) 10 MG tablet    Sig:  Take 4 tablets (40 mg total) by mouth daily.    Dispense:  60 tablet    Refill:  0       Return in about 2 days (around 04/16/2018) for POV.  There are no Patient Instructions on file for this visit.   Explained the diagnoses, plan, and follow up with the patient and they expressed understanding.  Patient expressed understanding of the importance of proper follow up care.     Gardiner Sleeper, M.D., Ph.D. Diseases & Surgery of the Retina and Vitreous [facility]  I have reviewed the above documentation for accuracy and completeness, and I agree with the above. Gardiner Sleeper, M.D., Ph.D. 04/14/18 11:40 AM   Abbreviations: M myopia (nearsighted); A astigmatism; H hyperopia (farsighted); P presbyopia; Mrx spectacle prescription;  CTL contact lenses; OD right eye; OS left eye; OU both eyes  XT exotropia; ET esotropia; PEK punctate epithelial keratitis; PEE punctate epithelial erosions; DES dry eye syndrome; MGD meibomian gland dysfunction; ATs artificial tears; PFAT's preservative free artificial tears; [place] nuclear sclerotic cataract; PSC posterior subcapsular cataract; ERM epi-retinal membrane; PVD posterior vitreous detachment; RD retinal detachment; DM diabetes mellitus; DR diabetic retinopathy; NPDR non-proliferative diabetic retinopathy; PDR proliferative diabetic retinopathy; CSME clinically significant macular edema; DME diabetic macular edema; dbh dot blot hemorrhages; CWS cotton wool spot; POAG primary open angle glaucoma; C/D cup-to-disc ratio; HVF humphrey visual field; GVF goldmann visual field; OCT optical coherence tomography; IOP intraocular pressure; BRVO Branch retinal vein occlusion; CRVO central retinal vein occlusion; CRAO central retinal artery occlusion; BRAO branch retinal artery occlusion; RT retinal tear; SB scleral buckle; PPV pars plana vitrectomy; VH Vitreous hemorrhage; PRP panretinal laser photocoagulation; IVK intravitreal kenalog; VMT vitreomacular traction; MH Macular hole;  NVD neovascularization of the disc; NVE neovascularization elsewhere; AREDS age related eye disease study; ARMD age related macular degeneration; POAG primary open angle glaucoma; EBMD epithelial/anterior basement membrane dystrophy; ACIOL anterior chamber intraocular lens; IOL intraocular lens; PCIOL posterior chamber intraocular lens; Phaco/IOL  phacoemulsification with intraocular lens placement; [place] photorefractive keratectomy; LASIK laser assisted in situ keratomileusis; HTN hypertension; DM diabetes mellitus; COPD chronic obstructive pulmonary disease

## 2018-04-15 NOTE — Progress Notes (Signed)
[facility] Note  04/16/2018     CHIEF COMPLAINT Patient presents for Retina Follow Up   HISTORY OF PRESENT ILLNESS: Gloria Gomez is a 48 y.o. female who presents to the clinic today for:   HPI    Retina Follow Up    Patient presents with  Other.  In right eye.  This started 1 week ago.  Severity is moderate.  Duration of 1 week.  Since onset it is stable.  I, the attending physician,  performed the HPI with the patient and updated documentation appropriately.          Comments    Patient here for POD4 endophthalmitis OD (s/p PPV 02.14). Patient states vision terrible. Started last week Tuesday.  Now can see shadows and light. Can see fingers moving around. Has eye pain from surgery and shots. Can feel stitches.        Last edited by Bernarda Caffey, MD on 04/16/2018  1:15 PM. (History)    pt states she experienced irritation after receiving an injection of antibiotics in office at last visit, she states that has now gone away, she states she is using the drops and taking the oral medication as directed  Referring physician: Tammi Sou, MD [address] [place], [place] [address]  HISTORICAL INFORMATION:   Selected notes from the MEDICAL RECORD NUMBER Referred for DM exam LEE:  Ocular Hx-NPDR PMH-DM (takes invokana and metformin), high cholesterol, benign brain tumor, white coat HTN    CURRENT MEDICATIONS: Current Outpatient Medications (Ophthalmic Drugs)  Medication Sig  . Difluprednate 0.05 % EMUL Apply 1 drop to eye every hour while awake.  . prednisoLONE acetate (PRED FORTE) 1 % ophthalmic suspension Place 1 drop into the right eye every hour while awake.   No current facility-administered medications for this visit.  (Ophthalmic Drugs)   Current Outpatient Medications (Other)  Medication Sig  . ciprofloxacin (CIPRO) 500 MG tablet Take 1 tablet (500 mg total) by mouth 2 (two) times daily for 10 days.  Marland Kitchen glipiZIDE (GLUCOTROL  XL) 10 MG 24 hr tablet TAKE 1 TABLET BY MOUTH WITH BREAKFAST  . HYDROcodone-acetaminophen (NORCO/VICODIN) 5-325 MG tablet Take 1-2 tablets by mouth every 6 (six) hours as needed for moderate pain.  . INVOKANA 300 MG TABS tablet TAKE 1 TABLET BY MOUTH ONCE DAILY BEFORE  BREAKFAST  . losartan (COZAAR) 100 MG tablet Take 1 tablet (100 mg total) by mouth daily.  . metFORMIN (GLUCOPHAGE-XR) 500 MG 24 hr tablet TAKE 2 TABLETS BY MOUTH TWICE DAILY  . pantoprazole (PROTONIX) 40 MG tablet TAKE 1 TABLET BY MOUTH ONCE DAILY  . predniSONE (DELTASONE) 10 MG tablet Take 4 tablets (40 mg total) by mouth daily.  . promethazine (PHENERGAN) 12.5 MG tablet 1-2 tabs po q6h prn nausea  . temazepam (RESTORIL) 15 MG capsule Take 1 capsule (15 mg total) by mouth at bedtime as needed for sleep.   No current facility-administered medications for this visit.  (Other)      REVIEW OF SYSTEMS: ROS    Positive for: Endocrine, Eyes   Negative for: Constitutional, Gastrointestinal, Neurological, Skin, Genitourinary, Musculoskeletal, HENT, Cardiovascular, Respiratory, Psychiatric, Allergic/Imm, Heme/Lymph   Last edited by Theodore Demark on 04/16/2018  1:12 PM. (History)       ALLERGIES Allergies  Allergen Reactions  . Gluten Meal Swelling  . Lisinopril Cough  . Pioglitazone Other (See Comments)    ELEVATED glucoses + worse chronic nausea  PAST MEDICAL HISTORY Past Medical History:  Diagnosis Date  . Abdominal bloating    likely from diab gastroparesis.  Dr. Havery Moros to do EGD as of 10/2017 GI eval.  . Benign brain tumor ([place])    Cystic lesion in cerebral aqueduct region with mild hydrocephalus-- stable MRI 02/2016.  Surveillance MRI 05/2017 --dilated cerebral aqueduct related to aqueductal stenosis and subsequent mild hydrocephalus (due to the 11 mm stable cystic lesion in cerebral aqueduct---?congenitial?.  . Dysmenorrhea    vicodin occ during first 2 days of cycle.  . Gluten intolerance    pt reports  she underwent full GI w/u to r/o celiac dz  . Hepatic steatosis    ultrasound 08/2017  . Hyperlipidemia, mixed   . Hypertensive retinopathy of both eyes   . Proliferative diabetic retinopathy of both eyes ([place])    steroid injections 10/2017--improved  . Sensorineural hearing loss of left ear    Sudden left hearing loss summer 2016--no improvement with steroids 01/2015 so brain MRI done by Dr. Redmond Baseman and it showed brain tumor that was determined to be benign.  Pt's hearing not bad enough for hearing aid as of 06/2016.  . Type 2 diabetes with complication ([place])    diab retpthy, diabetic gastroparesis (gastric emptying study mildly abnl 03/2017)  . White coat hypertension    Past Surgical History:  Procedure Laterality Date  . CHOLECYSTECTOMY  2000  . GASTRIC EMPTYING SCAN  04/20/2017   Mildly abnormal, particularly the 1st hour of emptying.  Marland Kitchen PARS PLANA VITRECTOMY Right 04/12/2018   Procedure: Right PARS PLANA VITRECTOMY WITH 25 GAUGE with intravitreal antibiotics;  Surgeon: Bernarda Caffey, MD;  Location: [facility];  Service: Ophthalmology;  Laterality: Right;    FAMILY HISTORY Family History  Problem Relation Age of Onset  . Brain cancer Mother   . Diabetes Father   . Diabetes Maternal Grandmother   . Cataracts Maternal Grandmother   . Cervical cancer Paternal Grandmother   . Colon cancer Maternal Grandfather 74  . Amblyopia Neg Hx   . Blindness Neg Hx   . Glaucoma Neg Hx   . Macular degeneration Neg Hx   . Retinal detachment Neg Hx   . Strabismus Neg Hx   . Retinitis pigmentosa Neg Hx     SOCIAL HISTORY Social History   Tobacco Use  . Smoking status: Never Smoker  . Smokeless tobacco: Never Used  Substance Use Topics  . Alcohol use: No  . Drug use: No         OPHTHALMIC EXAM:  Base Eye Exam    Visual Acuity (Snellen - Linear)      Right Left   Dist cc CF at 2'    Dist ph cc NI    Correction:  Glasses       Tonometry (Tonopen, 1:07 PM)      Right Left   Pressure  15        Pupils      Dark Light Shape APD   Right 5 5 Round None   Left           Neuro/Psych    Oriented x3:  Yes   Mood/Affect:  Normal       Dilation    Right eye:  1.0% Mydriacyl, 2.5% Phenylephrine @ 1:07 PM        Slit Lamp and Fundus Exam    Slit Lamp Exam      Right Left   Lids/Lashes Dermatochalasis - upper lid, Meibomian gland dysfunction,  Telangiectasia Dermatochalasis - upper lid, Meibomian gland dysfunction, Telangiectasia   Conjunctiva/Sclera [place] ST quadrant; sutures intact White and quiet   Cornea 2-3+ Punctate epithelial erosions, 2-3+ Descemet's folds Clear   Anterior Chamber moderate depth, 4+ Cell, 1.5 mm hypopyon Deep and quiet   Iris Round and dilated, No NVI Round and dilated, No NVI   Lens 2+ Nuclear sclerosis, 2+ Cortical cataract 2+ Nuclear sclerosis, 2+ Cortical cataract   Vitreous post vitrectomy; vitreous debris, Cell Vitreous syneresis, residual VH settled inferiorly       Fundus Exam      Right Left   Disc hazy view Regressed NVE and fibrosis, compact   C/D Ratio  0.1   Macula hazy view, improved red reflex good foveal reflex, cluster of MA's and exudates temporal macula   Vessels hazy view Vascular attenuation, mild Tortuousity   Periphery Hazy view, improved red reflex Attached, scattered DBH, good 360 PRP laser changes, room for fill in posteriorly.           IMAGING AND PROCEDURES  Imaging and Procedures for @TODAY @          ASSESSMENT/PLAN:    ICD-10-CM   1. Right endophthalmia H44.001   2. Vitreous hemorrhage of right eye ([place]) H43.11   3. Proliferative diabetic retinopathy of both eyes with macular edema associated with type 2 diabetes mellitus ([place]) Y70.6237   4. Retinal edema H35.81   5. Hypertensive retinopathy of both eyes H35.033   6. Essential hypertension I10   7. Combined forms of age-related cataract of both eyes H25.813     1. Endophthalmitis OD - s/p IVA OU 04/09/2018 - POD4 s/p 25g PPV w/ intravitreal  vanc, ceftaz and cefepime OD, 2.14.2020 - s/p intravitreal tap / vanc and ceflaz injections (02.16.20)             - pain controlled             - significant inflammation; +corneal edema improving             - IOP 15             - gram stain shows G+ cocci, WBCs mostly PMNs; repeat gram stain from t/i -- no organisms, just WBCs             - cultures from vitreous growing rare Staph warneri; cultures from t/i -- no growth             - mild increase in hypopyon             - cont  PF q1h OD-- switch to Durezol                          zymaxid QID OD                         Atropine BID OD                         Brimonidine BID OD                         PSO ung QID OD             - cont PO cipro 500 mg BID x 10 days             - cont PO prednisone 40 mg daily             -  eye shield when sleeping; keep head elevated             - post op drop instructions reviewed             - tylenol/ibuprofen for pain             - discussed guarded prognosis and possible need for additional intravitreal antibiotics - f/u Friday 04/19/2018 for POW1  2. Vitreous Hemorrhage OD - secondary to PDR as described below - S/P IVA OD #1 (09.20.19), #2 (10.25.19), #3 (11.15.19), #4 (12.16.19), #5 (03/12/2018) - S/P PRP OD #1 (09.27.19), fill in OD (11.21.19) -- each somewhat limited by residual VH  3,4. Proliferative diabetic retinopathy w/ DME, OU - s/p IVA OD #1 9.20.19, #2 (10.25.19), #3 (11.15.19), #4 (12.17.19), #5 (01.14.20), #6 (2.11.20) - s/p IVA OS #1 9.27.19, #2 (10.25.19), #3 (11.15.19), #4 (12.17.19), #5 (01.14.20), #6 (2.11.20) - OS doing well -- VA stable at 20/25 - HbA1c 7.7% (10.2.19) from 10.3% (6.26.19) - FA (9.20.19) shows +NVE OU and leaking MA and capillary nonperfusion - repeat FA 11.15.19 shows NV regressing OU - OCT confirms diabetic macular edema OU -- improving - S/P PRP OS (09.20.19) - S/P PRP OD (9.27.19 and 11.21.19) - inferior periphery obscured by VH  5,6.  Hypertensive retinopathy OU - discussed importance of tight BP control. - monitor for now  7. Combined form age related cataract OU-  - The symptoms of cataract, surgical options, and treatments and risks were discussed with patient. - discussed diagnosis and progression - not yet visually significant - monitor for now   Ophthalmic Meds Ordered this visit:  Meds ordered this encounter  Medications  . Difluprednate 0.05 % EMUL    Sig: Apply 1 drop to eye every hour while awake.    Dispense:  15 mL    Refill:  1  . prednisoLONE acetate (PRED FORTE) 1 % ophthalmic suspension    Sig: Place 1 drop into the right eye every hour while awake.    Dispense:  15 mL    Refill:  0       Return in about 3 days (around 04/19/2018) for f/u Endophthalmitis OD, DFE, OCT.  There are no Patient Instructions on file for this visit.   Explained the diagnoses, plan, and follow up with the patient and they expressed understanding.  Patient expressed understanding of the importance of proper follow up care.   This document serves as a record of services personally performed by Gardiner Sleeper, MD, PhD. It was created on their behalf by Ernest Mallick, OA, an ophthalmic assistant. The creation of this record is the provider's dictation and/or activities during the visit.    Electronically signed by: Ernest Mallick, OA  02.17.2020 1:25 PM    Gardiner Sleeper, M.D., Ph.D. Diseases & Surgery of the Retina and Vitreous [facility]  I have reviewed the above documentation for accuracy and completeness, and I agree with the above. Gardiner Sleeper, M.D., Ph.D. 04/17/18 1:35 PM    Abbreviations: M myopia (nearsighted); A astigmatism; H hyperopia (farsighted); P presbyopia; Mrx spectacle prescription;  CTL contact lenses; OD right eye; OS left eye; OU both eyes  XT exotropia; ET esotropia; PEK punctate epithelial keratitis; PEE punctate epithelial erosions; DES dry eye syndrome; MGD  meibomian gland dysfunction; ATs artificial tears; PFAT's preservative free artificial tears; [place] nuclear sclerotic cataract; PSC posterior subcapsular cataract; ERM epi-retinal membrane; PVD posterior vitreous detachment; RD retinal detachment; DM diabetes mellitus; DR  diabetic retinopathy; NPDR non-proliferative diabetic retinopathy; PDR proliferative diabetic retinopathy; CSME clinically significant macular edema; DME diabetic macular edema; dbh dot blot hemorrhages; CWS cotton wool spot; POAG primary open angle glaucoma; C/D cup-to-disc ratio; HVF humphrey visual field; GVF goldmann visual field; OCT optical coherence tomography; IOP intraocular pressure; BRVO Branch retinal vein occlusion; CRVO central retinal vein occlusion; CRAO central retinal artery occlusion; BRAO branch retinal artery occlusion; RT retinal tear; SB scleral buckle; PPV pars plana vitrectomy; VH Vitreous hemorrhage; PRP panretinal laser photocoagulation; IVK intravitreal kenalog; VMT vitreomacular traction; MH Macular hole;  NVD neovascularization of the disc; NVE neovascularization elsewhere; AREDS age related eye disease study; ARMD age related macular degeneration; POAG primary open angle glaucoma; EBMD epithelial/anterior basement membrane dystrophy; ACIOL anterior chamber intraocular lens; IOL intraocular lens; PCIOL posterior chamber intraocular lens; Phaco/IOL phacoemulsification with intraocular lens placement; [place] photorefractive keratectomy; LASIK laser assisted in situ keratomileusis; HTN hypertension; DM diabetes mellitus; COPD chronic obstructive pulmonary disease

## 2018-04-16 ENCOUNTER — Ambulatory Visit (INDEPENDENT_AMBULATORY_CARE_PROVIDER_SITE_OTHER): Payer: BLUE CROSS/BLUE SHIELD | Admitting: Ophthalmology

## 2018-04-16 ENCOUNTER — Encounter (INDEPENDENT_AMBULATORY_CARE_PROVIDER_SITE_OTHER): Payer: Self-pay | Admitting: Ophthalmology

## 2018-04-16 DIAGNOSIS — H25813 Combined forms of age-related cataract, bilateral: Secondary | ICD-10-CM

## 2018-04-16 DIAGNOSIS — H44001 Unspecified purulent endophthalmitis, right eye: Secondary | ICD-10-CM

## 2018-04-16 DIAGNOSIS — H35033 Hypertensive retinopathy, bilateral: Secondary | ICD-10-CM

## 2018-04-16 DIAGNOSIS — H4311 Vitreous hemorrhage, right eye: Secondary | ICD-10-CM

## 2018-04-16 DIAGNOSIS — I10 Essential (primary) hypertension: Secondary | ICD-10-CM

## 2018-04-16 DIAGNOSIS — E113513 Type 2 diabetes mellitus with proliferative diabetic retinopathy with macular edema, bilateral: Secondary | ICD-10-CM

## 2018-04-16 DIAGNOSIS — H3581 Retinal edema: Secondary | ICD-10-CM

## 2018-04-16 LAB — EYE CULTURE: Culture: NO GROWTH

## 2018-04-16 MED ORDER — DIFLUPREDNATE 0.05 % OP EMUL: 1.0000 [drp] | OPHTHALMIC | 1 refills | Status: DC

## 2018-04-16 MED FILL — Cefepime HCl For Inj 1 GM: INTRAMUSCULAR | Qty: 11.25 | Status: AC

## 2018-04-16 MED FILL — Ceftazidime For Inj 1 GM: INTRAVITREAL | Qty: 2.25 | Status: AC

## 2018-04-16 MED FILL — Vancomycin HCl For IV Soln 1 GM (Base Equivalent): INTRAOCULAR | Qty: 1 | Status: AC

## 2018-04-17 MED ORDER — PREDNISOLONE ACETATE 1 % OP SUSP: 1.0000 [drp] | OPHTHALMIC | 0 refills | Status: DC

## 2018-04-19 ENCOUNTER — Encounter (INDEPENDENT_AMBULATORY_CARE_PROVIDER_SITE_OTHER): Payer: BLUE CROSS/BLUE SHIELD | Admitting: Ophthalmology

## 2018-04-21 NOTE — Progress Notes (Signed)
[facility]  04/22/2018     CHIEF COMPLAINT Patient presents for Post-op Follow-up   HISTORY OF PRESENT ILLNESS: Gloria Gomez is a 48 y.o. female who presents to the clinic today for:   HPI    Post-op Follow-up    In right eye.  Discomfort includes itching and floaters.  Negative for pain, foreign body sensation, tearing and discharge.  Vision is improved.  I, the attending physician,  performed the HPI with the patient and updated documentation appropriately.          Comments    POV Enophthalmia OD. Patient states her vision has "gotten a little better", she has occasional itching and floaters Od, denies flashes and ocular pain. Pt is using gtt's/oint as instructed, and is completing Cipro today.        Last edited by Bernarda Caffey, MD on 04/22/2018  3:26 PM. (History)    pt states she was able to get Durezol drops and is using them every hour, she states she felt her vision got better within 24 hours of starting it, pt states she is still on cipro and prednisone pills, pt states she sees a lot of floaters, but they are getting better  Referring physician: Tammi Sou, MD [address] [place], [place] [address]  HISTORICAL INFORMATION:   Selected notes from the MEDICAL RECORD NUMBER Referred for DM exam LEE:  Ocular Hx-NPDR PMH-DM (takes invokana and metformin), high cholesterol, benign brain tumor, white coat HTN    CURRENT MEDICATIONS: Current Outpatient Medications (Ophthalmic Drugs)  Medication Sig  . Difluprednate 0.05 % EMUL Apply 1 drop to eye every hour while awake.  . prednisoLONE acetate (PRED FORTE) 1 % ophthalmic suspension Place 1 drop into the right eye every hour while awake.   No current facility-administered medications for this visit.  (Ophthalmic Drugs)   Current Outpatient Medications (Other)  Medication Sig  . ciprofloxacin (CIPRO) 500 MG tablet Take 1 tablet (500 mg total) by mouth 2 (two) times  daily for 10 days.  Marland Kitchen glipiZIDE (GLUCOTROL XL) 10 MG 24 hr tablet TAKE 1 TABLET BY MOUTH WITH BREAKFAST  . HYDROcodone-acetaminophen (NORCO/VICODIN) 5-325 MG tablet Take 1-2 tablets by mouth every 6 (six) hours as needed for moderate pain.  . INVOKANA 300 MG TABS tablet TAKE 1 TABLET BY MOUTH ONCE DAILY BEFORE  BREAKFAST  . losartan (COZAAR) 100 MG tablet Take 1 tablet (100 mg total) by mouth daily.  . metFORMIN (GLUCOPHAGE-XR) 500 MG 24 hr tablet TAKE 2 TABLETS BY MOUTH TWICE DAILY  . pantoprazole (PROTONIX) 40 MG tablet TAKE 1 TABLET BY MOUTH ONCE DAILY  . predniSONE (DELTASONE) 10 MG tablet Take 4 tablets (40 mg total) by mouth daily.  . promethazine (PHENERGAN) 12.5 MG tablet 1-2 tabs po q6h prn nausea  . temazepam (RESTORIL) 15 MG capsule Take 1 capsule (15 mg total) by mouth at bedtime as needed for sleep.   No current facility-administered medications for this visit.  (Other)      REVIEW OF SYSTEMS: ROS    Positive for: Endocrine, Eyes   Negative for: Constitutional, Gastrointestinal, Neurological, Skin, Genitourinary, Musculoskeletal, HENT, Cardiovascular, Respiratory, Psychiatric, Allergic/Imm, Heme/Lymph   Last edited by Zenovia Jordan, LPN on 7/40/8144  8:18 PM. (History)       ALLERGIES Allergies  Allergen Reactions  . Gluten Meal Swelling  . Lisinopril Cough  . Pioglitazone Other (See Comments)    ELEVATED glucoses + worse chronic nausea  PAST MEDICAL HISTORY Past Medical History:  Diagnosis Date  . Abdominal bloating    likely from diab gastroparesis.  Dr. Havery Moros to do EGD as of 10/2017 GI eval.  . Benign brain tumor ([place])    Cystic lesion in cerebral aqueduct region with mild hydrocephalus-- stable MRI 02/2016.  Surveillance MRI 05/2017 --dilated cerebral aqueduct related to aqueductal stenosis and subsequent mild hydrocephalus (due to the 11 mm stable cystic lesion in cerebral aqueduct---?congenitial?.  . Dysmenorrhea    vicodin occ during first 2 days  of cycle.  . Gluten intolerance    pt reports she underwent full GI w/u to r/o celiac dz  . Hepatic steatosis    ultrasound 08/2017  . Hyperlipidemia, mixed   . Hypertensive retinopathy of both eyes   . Proliferative diabetic retinopathy of both eyes ([place])    steroid injections 10/2017--improved  . Sensorineural hearing loss of left ear    Sudden left hearing loss summer 2016--no improvement with steroids 01/2015 so brain MRI done by Dr. Redmond Baseman and it showed brain tumor that was determined to be benign.  Pt's hearing not bad enough for hearing aid as of 06/2016.  . Type 2 diabetes with complication ([place])    diab retpthy, diabetic gastroparesis (gastric emptying study mildly abnl 03/2017)  . White coat hypertension    Past Surgical History:  Procedure Laterality Date  . CHOLECYSTECTOMY  2000  . GASTRIC EMPTYING SCAN  04/20/2017   Mildly abnormal, particularly the 1st hour of emptying.  Marland Kitchen PARS PLANA VITRECTOMY Right 04/12/2018   Procedure: Right PARS PLANA VITRECTOMY WITH 25 GAUGE with intravitreal antibiotics;  Surgeon: Bernarda Caffey, MD;  Location: [facility];  Service: Ophthalmology;  Laterality: Right;    FAMILY HISTORY Family History  Problem Relation Age of Onset  . Brain cancer Mother   . Diabetes Father   . Diabetes Maternal Grandmother   . Cataracts Maternal Grandmother   . Cervical cancer Paternal Grandmother   . Colon cancer Maternal Grandfather 61  . Amblyopia Neg Hx   . Blindness Neg Hx   . Glaucoma Neg Hx   . Macular degeneration Neg Hx   . Retinal detachment Neg Hx   . Strabismus Neg Hx   . Retinitis pigmentosa Neg Hx     SOCIAL HISTORY Social History   Tobacco Use  . Smoking status: Never Smoker  . Smokeless tobacco: Never Used  Substance Use Topics  . Alcohol use: No  . Drug use: No         OPHTHALMIC EXAM:  Base Eye Exam    Visual Acuity (Snellen - Linear)      Right Left   Dist cc 20/100 -2 20/25 -1   Dist ph cc 20/60 -2 NI   Correction:  Glasses        Tonometry (Tonopen, 3:12 PM)      Right Left   Pressure 17 12       Pupils      Dark Light Shape React APD   Right 6  Round  None   Left 3 2 Round Brisk None       Visual Fields (Counting fingers)      Left Right    Full Full       Extraocular Movement      Right Left    Full, Ortho Full, Ortho       Neuro/Psych    Oriented x3:  Yes   Mood/Affect:  Normal       Dilation  Right eye:  1.0% Mydriacyl, 2.5% Phenylephrine @ 3:08 PM        Slit Lamp and Fundus Exam    Slit Lamp Exam      Right Left   Lids/Lashes Dermatochalasis - upper lid, Meibomian gland dysfunction, Telangiectasia Dermatochalasis - upper lid, Meibomian gland dysfunction, Telangiectasia   Conjunctiva/Sclera [place] ST quadrant; sutures intact White and quiet   Cornea 2-3+ Punctate epithelial erosions, 1+ Descemet's folds.1+endopigment inferior Clear   Anterior Chamber moderate depth, 2-3+ Cell/pigment, hypopyon less than 1 mm inferiorly Deep and quiet   Iris Round and dilated, No NVI Round and dilated, No NVI   Lens 2+ Nuclear sclerosis, 2+ Cortical cataract, Posterior subcapsular cataract 2+ Nuclear sclerosis, 2+ Cortical cataract   Vitreous post vitrectomy; scattered debris, vitreous debris/condensations collecting inferiorly Vitreous syneresis, residual VH settled inferiorly       Fundus Exam      Right Left   Disc fibrosis extending along ST arcades Regressed NVE and fibrosis, compact   C/D Ratio  0.1   Macula Scattered IRH, exudates temporal macula, interval improvement in edema good foveal reflex, cluster of MA's and exudates temporal macula   Vessels Vascular attenuation; fibrosis along proximal ST arcades Vascular attenuation, mild Tortuousity   Periphery Attached; vitreous debris obscuring inferior view Attached, scattered DBH, good 360 PRP laser changes, room for fill in posteriorly.           IMAGING AND PROCEDURES  Imaging and Procedures for @TODAY @  OCT, Retina - OU - Both  Eyes       Right Eye Quality was good. Central Foveal Thickness: 353. Progression has improved. Findings include abnormal foveal contour, epiretinal membrane, intraretinal fluid, no SRF, intraretinal hyper-reflective material, vitreous traction (Vitreous opacitites, interval improvement in IRF).   Left Eye Quality was good. Central Foveal Thickness: 291. Progression has been stable. Findings include intraretinal fluid, no SRF, normal foveal contour, intraretinal hyper-reflective material, epiretinal membrane (Mildly improved IRF).   Notes *Images captured and stored on drive  Diagnosis / Impression:  OD: improved DME; improving vitreous opacities OS: abnormal foveal contour; +IRF; preretinal fibrosis about the disc; stable/persistent cystic changes  Clinical management:  See below  Abbreviations: NFP - Normal foveal profile. CME - cystoid macular edema. PED - pigment epithelial detachment. IRF - intraretinal fluid. SRF - subretinal fluid. EZ - ellipsoid zone. ERM - epiretinal membrane. ORA - outer retinal atrophy. ORT - outer retinal tubulation. SRHM - subretinal hyper-reflective material                 ASSESSMENT/PLAN:    ICD-10-CM   1. Right endophthalmia H44.001   2. Vitreous hemorrhage of right eye ([place]) H43.11   3. Proliferative diabetic retinopathy of both eyes with macular edema associated with type 2 diabetes mellitus ([place]) C37.6283   4. Retinal edema H35.81 OCT, Retina - OU - Both Eyes  5. Hypertensive retinopathy of both eyes H35.033   6. Essential hypertension I10   7. Combined forms of age-related cataract of both eyes H25.813     1. Endophthalmitis OD - s/p IVA OU 04/09/2018 - POD10 s/p 25g PPV w/ intravitreal vanc, ceftaz and cefepime OD, 2.14.2020 - s/p intravitreal tap / vanc and ceflaz injections (02.16.20)             - doing much better today             - significant inflammation improving (hyphema clearing); +corneal edema improving             -  IOP 17             - gram stain (2.14.20) shows G+ cocci, WBCs mostly PMNs;   - repeat gram stain from t/i (2.16.20) -- no organisms, just WBCs             - cultures from vitreous grew rare Staph warneri; cultures from t/i -- no growth             - hypopyon almost cleared today; still w/ 2+ AC cell/pigment and significant vitreous debris/condensations             - cont  Durezol q1h OD                         zymaxid QID OD-- okay to stop when bottle runs out                         dec Atropine to Qdaily OD                         Brimonidine BID OD                         PSO ung QID OD             - cont PO cipro 500 mg BID x 10 days              - decrease PO prednisone to 20mg  daily until next visit             - eye shield when sleeping; keep head elevated             - post op drop instructions reviewed             - tylenol/ibuprofen for pain - f/u 1 week  2. Vitreous Hemorrhage OD - secondary to PDR as described below - S/P IVA OD #1 (09.20.19), #2 (10.25.19), #3 (11.15.19), #4 (12.16.19), #5 (03/12/2018) - S/P PRP OD #1 (09.27.19), fill in OD (11.21.19) -- each somewhat limited by residual VH  3,4. Proliferative diabetic retinopathy w/ DME, OU - s/p IVA OD #1 9.20.19, #2 (10.25.19), #3 (11.15.19), #4 (12.17.19), #5 (01.14.20), #6 (2.11.20) - s/p IVA OS #1 9.27.19, #2 (10.25.19), #3 (11.15.19), #4 (12.17.19), #5 (01.14.20), #6 (2.11.20) - OS doing well -- VA stable at 20/25 - HbA1c 7.7% (10.2.19) from 10.3% (6.26.19) - FA (9.20.19) shows +NVE OU and leaking MA and capillary nonperfusion - repeat FA 11.15.19 shows NV regressing OU - OCT confirms diabetic macular edema OU -- improving - S/P PRP OS (09.20.19) - S/P PRP OD (9.27.19 and 11.21.19) - inferior periphery obscured by VH  5,6. Hypertensive retinopathy OU - discussed importance of tight BP control. - monitor for now  7. Combined form age related cataract OU-  - The symptoms of cataract, surgical options,  and treatments and risks were discussed with patient. - discussed diagnosis and progression - not yet visually significant - monitor for now   Ophthalmic Meds Ordered this visit:  No orders of the defined types were placed in this encounter.      Return in about 8 days (around 04/30/2018) for f/u Endophthalmitis OD, DFE, OCT.  There are no Patient Instructions on file for this visit.   Explained the diagnoses, plan, and follow up with the patient and they expressed understanding.  Patient expressed understanding of the  importance of proper follow up care.   This document serves as a record of services personally performed by Gardiner Sleeper, MD, PhD. It was created on their behalf by Ernest Mallick, OA, an ophthalmic assistant. The creation of this record is the provider's dictation and/or activities during the visit.    Electronically signed by: Ernest Mallick, OA 02.23.2020 5:01 PM    Gardiner Sleeper, M.D., Ph.D. Diseases & Surgery of the Retina and Vitreous [facility]  I have reviewed the above documentation for accuracy and completeness, and I agree with the above. Gardiner Sleeper, M.D., Ph.D. 04/22/18 5:01 PM     Abbreviations: M myopia (nearsighted); A astigmatism; H hyperopia (farsighted); P presbyopia; Mrx spectacle prescription;  CTL contact lenses; OD right eye; OS left eye; OU both eyes  XT exotropia; ET esotropia; PEK punctate epithelial keratitis; PEE punctate epithelial erosions; DES dry eye syndrome; MGD meibomian gland dysfunction; ATs artificial tears; PFAT's preservative free artificial tears; [place] nuclear sclerotic cataract; PSC posterior subcapsular cataract; ERM epi-retinal membrane; PVD posterior vitreous detachment; RD retinal detachment; DM diabetes mellitus; DR diabetic retinopathy; NPDR non-proliferative diabetic retinopathy; PDR proliferative diabetic retinopathy; CSME clinically significant macular edema; DME diabetic macular edema; dbh  dot blot hemorrhages; CWS cotton wool spot; POAG primary open angle glaucoma; C/D cup-to-disc ratio; HVF humphrey visual field; GVF goldmann visual field; OCT optical coherence tomography; IOP intraocular pressure; BRVO Branch retinal vein occlusion; CRVO central retinal vein occlusion; CRAO central retinal artery occlusion; BRAO branch retinal artery occlusion; RT retinal tear; SB scleral buckle; PPV pars plana vitrectomy; VH Vitreous hemorrhage; PRP panretinal laser photocoagulation; IVK intravitreal kenalog; VMT vitreomacular traction; MH Macular hole;  NVD neovascularization of the disc; NVE neovascularization elsewhere; AREDS age related eye disease study; ARMD age related macular degeneration; POAG primary open angle glaucoma; EBMD epithelial/anterior basement membrane dystrophy; ACIOL anterior chamber intraocular lens; IOL intraocular lens; PCIOL posterior chamber intraocular lens; Phaco/IOL phacoemulsification with intraocular lens placement; [place] photorefractive keratectomy; LASIK laser assisted in situ keratomileusis; HTN hypertension; DM diabetes mellitus; COPD chronic obstructive pulmonary disease

## 2018-04-22 ENCOUNTER — Ambulatory Visit (INDEPENDENT_AMBULATORY_CARE_PROVIDER_SITE_OTHER): Payer: BLUE CROSS/BLUE SHIELD | Admitting: Ophthalmology

## 2018-04-22 ENCOUNTER — Encounter (INDEPENDENT_AMBULATORY_CARE_PROVIDER_SITE_OTHER): Payer: Self-pay | Admitting: Ophthalmology

## 2018-04-22 DIAGNOSIS — H3581 Retinal edema: Secondary | ICD-10-CM

## 2018-04-22 DIAGNOSIS — I10 Essential (primary) hypertension: Secondary | ICD-10-CM

## 2018-04-22 DIAGNOSIS — H44001 Unspecified purulent endophthalmitis, right eye: Secondary | ICD-10-CM

## 2018-04-22 DIAGNOSIS — H25813 Combined forms of age-related cataract, bilateral: Secondary | ICD-10-CM

## 2018-04-22 DIAGNOSIS — H35033 Hypertensive retinopathy, bilateral: Secondary | ICD-10-CM

## 2018-04-22 DIAGNOSIS — H4311 Vitreous hemorrhage, right eye: Secondary | ICD-10-CM

## 2018-04-22 DIAGNOSIS — E113513 Type 2 diabetes mellitus with proliferative diabetic retinopathy with macular edema, bilateral: Secondary | ICD-10-CM

## 2018-04-28 NOTE — Progress Notes (Signed)
[facility] Note  04/30/2018     CHIEF COMPLAINT Patient presents for Retina Follow Up   HISTORY OF PRESENT ILLNESS: Gloria Gomez is a 48 y.o. female who presents to the clinic today for:   HPI    Retina Follow Up    Patient presents with  Other.  In right eye.  This started 1 week ago.  Severity is moderate.  Duration of 1 week.  Since onset it is gradually improving.  I, the attending physician,  performed the HPI with the patient and updated documentation appropriately.          Comments    Patient here for 1 week retina  follow up for endophthalmitis OD. Patient states vision not quite as good as last week. Seems hazy at times. Sees a tiny strobe light in center of vision  When looks at tv screen. No eye pain       Last edited by Bernarda Caffey, MD on 04/30/2018  3:29 PM. (History)    pt states her right eye seems to be hazier than it was last time she was here, she states when she looks at a light or computer screen she sees a light pulsing right in her center vision, she states she does not see this when she looks at solid objects or people, pt states she is using Durezol once an hour until bedtime, she states she is on 20mg  po prednisone  Referring physician: Tammi Sou, MD [address] [place], [place] [address]  HISTORICAL INFORMATION:   Selected notes from the MEDICAL RECORD NUMBER Referred for DM exam LEE:  Ocular Hx-NPDR PMH-DM (takes invokana and metformin), high cholesterol, benign brain tumor, white coat HTN    CURRENT MEDICATIONS: Current Outpatient Medications (Ophthalmic Drugs)  Medication Sig  . bacitracin-polymyxin b (POLYSPORIN) ophthalmic ointment Place 1 application into the right eye at bedtime.  . Difluprednate 0.05 % EMUL Apply 1 drop to eye every hour while awake.  . prednisoLONE acetate (PRED FORTE) 1 % ophthalmic suspension Place 1 drop into the right eye every hour while awake.   No current  facility-administered medications for this visit.  (Ophthalmic Drugs)   Current Outpatient Medications (Other)  Medication Sig  . glipiZIDE (GLUCOTROL XL) 10 MG 24 hr tablet TAKE 1 TABLET BY MOUTH WITH BREAKFAST  . HYDROcodone-acetaminophen (NORCO/VICODIN) 5-325 MG tablet Take 1-2 tablets by mouth every 6 (six) hours as needed for moderate pain.  . INVOKANA 300 MG TABS tablet TAKE 1 TABLET BY MOUTH ONCE DAILY BEFORE  BREAKFAST  . losartan (COZAAR) 100 MG tablet Take 1 tablet (100 mg total) by mouth daily.  . metFORMIN (GLUCOPHAGE-XR) 500 MG 24 hr tablet TAKE 2 TABLETS BY MOUTH TWICE DAILY  . pantoprazole (PROTONIX) 40 MG tablet TAKE 1 TABLET BY MOUTH ONCE DAILY  . predniSONE (DELTASONE) 10 MG tablet Take 1 tablet (10 mg total) by mouth daily.  . promethazine (PHENERGAN) 12.5 MG tablet 1-2 tabs po q6h prn nausea  . temazepam (RESTORIL) 15 MG capsule Take 1 capsule (15 mg total) by mouth at bedtime as needed for sleep.   No current facility-administered medications for this visit.  (Other)      REVIEW OF SYSTEMS: ROS    Positive for: Endocrine, Eyes   Negative for: Constitutional, Gastrointestinal, Neurological, Skin, Genitourinary, Musculoskeletal, HENT, Cardiovascular, Respiratory, Psychiatric, Allergic/Imm, Heme/Lymph   Last edited by Theodore Demark on 04/30/2018  3:27 PM. (History)  ALLERGIES Allergies  Allergen Reactions  . Gluten Meal Swelling  . Lisinopril Cough  . Pioglitazone Other (See Comments)    ELEVATED glucoses + worse chronic nausea    PAST MEDICAL HISTORY Past Medical History:  Diagnosis Date  . Abdominal bloating    likely from diab gastroparesis.  Dr. Havery Moros to do EGD as of 10/2017 GI eval.  . Benign brain tumor ([place])    Cystic lesion in cerebral aqueduct region with mild hydrocephalus-- stable MRI 02/2016.  Surveillance MRI 05/2017 --dilated cerebral aqueduct related to aqueductal stenosis and subsequent mild hydrocephalus (due to the 11 mm stable  cystic lesion in cerebral aqueduct---?congenitial?.  . Dysmenorrhea    vicodin occ during first 2 days of cycle.  . Gluten intolerance    pt reports she underwent full GI w/u to r/o celiac dz  . Hepatic steatosis    ultrasound 08/2017  . Hyperlipidemia, mixed   . Hypertensive retinopathy of both eyes   . Proliferative diabetic retinopathy of both eyes ([place])    steroid injections 10/2017--improved  . Sensorineural hearing loss of left ear    Sudden left hearing loss summer 2016--no improvement with steroids 01/2015 so brain MRI done by Dr. Redmond Baseman and it showed brain tumor that was determined to be benign.  Pt's hearing not bad enough for hearing aid as of 06/2016.  . Type 2 diabetes with complication ([place])    diab retpthy, diabetic gastroparesis (gastric emptying study mildly abnl 03/2017)  . White coat hypertension    Past Surgical History:  Procedure Laterality Date  . CHOLECYSTECTOMY  2000  . GASTRIC EMPTYING SCAN  04/20/2017   Mildly abnormal, particularly the 1st hour of emptying.  Marland Kitchen PARS PLANA VITRECTOMY Right 04/12/2018   Procedure: Right PARS PLANA VITRECTOMY WITH 25 GAUGE with intravitreal antibiotics;  Surgeon: Bernarda Caffey, MD;  Location: [facility];  Service: Ophthalmology;  Laterality: Right;    FAMILY HISTORY Family History  Problem Relation Age of Onset  . Brain cancer Mother   . Diabetes Father   . Diabetes Maternal Grandmother   . Cataracts Maternal Grandmother   . Cervical cancer Paternal Grandmother   . Colon cancer Maternal Grandfather 61  . Amblyopia Neg Hx   . Blindness Neg Hx   . Glaucoma Neg Hx   . Macular degeneration Neg Hx   . Retinal detachment Neg Hx   . Strabismus Neg Hx   . Retinitis pigmentosa Neg Hx     SOCIAL HISTORY Social History   Tobacco Use  . Smoking status: Never Smoker  . Smokeless tobacco: Never Used  Substance Use Topics  . Alcohol use: No  . Drug use: No         OPHTHALMIC EXAM:  Base Eye Exam    Visual Acuity (Snellen -  Linear)      Right Left   Dist cc 20/60 20/20   Dist ph cc 20/40 -1    Correction:  Glasses       Tonometry (Tonopen, 3:23 PM)      Right Left   Pressure 15 16       Pupils      Dark Light Shape React APD   Right 6  Round     Left 3 2 Round Brisk None       Visual Fields (Counting fingers)      Left Right    Full Full       Extraocular Movement      Right Left  Full, Ortho Full, Ortho       Neuro/Psych    Oriented x3:  Yes   Mood/Affect:  Normal       Dilation    Both eyes:  1.0% Mydriacyl, 2.5% Phenylephrine @ 3:23 PM        Slit Lamp and Fundus Exam    Slit Lamp Exam      Right Left   Lids/Lashes Dermatochalasis - upper lid, Meibomian gland dysfunction, Telangiectasia Dermatochalasis - upper lid, Meibomian gland dysfunction, Telangiectasia   Conjunctiva/Sclera Underwood ST quadran - improvingt; sutures intact White and quiet   Cornea Clear, mild endopigment inferior Clear   Anterior Chamber Clear, no Cell/pigment, no hypopyon  Deep and quiet   Iris Round and dilated, No NVI Round and dilated, No NVI   Lens 2+ Nuclear sclerosis, 1-2+ Cortical cataract, 1+Posterior subcapsular cataract 2+ Nuclear sclerosis, 2+ Cortical cataract   Vitreous post vitrectomy; +cell/pigment, scattered vitreous debris - clearing and settling inferiorly Vitreous syneresis, residual VH settled inferiorly       Fundus Exam      Right Left   Disc fibrosis extending along ST arcades - regressing Regressed NVE and fibrosis, compact   C/D Ratio  0.1   Macula Blunted foveal reflex, cluster of MA and exudates temporal to fovea good foveal reflex, cluster of MA's and exudates temporal macula   Vessels Vascular attenuation Vascular attenuation, mild Tortuousity   Periphery Attached, scattered PRP and IRH Attached, scattered DBH, good 360 PRP laser changes, room for fill in posteriorly.         Refraction    Wearing Rx      Sphere Cylinder   Right -3.75 Sphere   Left -3.75 Sphere           IMAGING AND PROCEDURES  Imaging and Procedures for @TODAY @  OCT, Retina - OU - Both Eyes       Right Eye Quality was good. Central Foveal Thickness: 347. Progression has improved. Findings include abnormal foveal contour, epiretinal membrane, intraretinal fluid, no SRF, intraretinal hyper-reflective material, vitreous traction (Interval improvement in Vitreous opacitites; persistent DME).   Left Eye Quality was good. Central Foveal Thickness: 286. Progression has been stable. Findings include intraretinal fluid, no SRF, normal foveal contour, intraretinal hyper-reflective material, epiretinal membrane (Mildly improved IRF).   Notes *Images captured and stored on drive  Diagnosis / Impression:  OD: persistent DME; interval improvement in vitreous opacities OS: abnormal foveal contour; +IRF; preretinal fibrosis about the disc; stable/persistent cystic changes  Clinical management:  See below  Abbreviations: NFP - Normal foveal profile. CME - cystoid macular edema. PED - pigment epithelial detachment. IRF - intraretinal fluid. SRF - subretinal fluid. EZ - ellipsoid zone. ERM - epiretinal membrane. ORA - outer retinal atrophy. ORT - outer retinal tubulation. SRHM - subretinal hyper-reflective material                 ASSESSMENT/PLAN:    ICD-10-CM   1. Right endophthalmia H44.001   2. Vitreous hemorrhage of right eye ([place]) H43.11   3. Proliferative diabetic retinopathy of both eyes with macular edema associated with type 2 diabetes mellitus ([place]) G28.3662   4. Retinal edema H35.81 OCT, Retina - OU - Both Eyes  5. Hypertensive retinopathy of both eyes H35.033   6. Essential hypertension I10   7. Combined forms of age-related cataract of both eyes H25.813     1. Endophthalmitis OD - s/p IVA OU 04/09/2018 - POD10 s/p 25g PPV w/ intravitreal vanc, ceftaz and  cefepime OD, 2.14.2020 - s/p intravitreal tap / vanc and ceflaz injections (02.16.20)             - doing much  better today             - significant inflammation improving (hypopyon cleared and vitritis improved);   - corneal edema improving             - IOP 15             - gram stain (2.14.20) shows G+ cocci, WBCs mostly PMNs;   - repeat gram stain from t/i (2.16.20) -- no organisms, just WBCs             - cultures from vitreous grew rare Staph warneri; cultures from t/i -- no growth             - decrease  Durezol 6x day OD                         dec Atropine to Qdaily OD-- okay to stop                         Brimonidine BID OD                         PSO ung QID OD-- bed time             - completed PO cipro 500 mg BID x 10 days              - decrease PO prednisone to 10mg  daily until next visit             - eye shield when sleeping; keep head elevated             - post op drop instructions reviewed             - tylenol/ibuprofen for pain - f/u 2 weeks  2. Vitreous Hemorrhage OD -- cleared from PPV for endophthalmitis - secondary to PDR as described below - S/P IVA OD #1 (09.20.19), #2 (10.25.19), #3 (11.15.19), #4 (12.16.19), #5 (03/12/2018) - S/P PRP OD #1 (09.27.19), fill in OD (11.21.19) -- each somewhat limited by residual VH  3,4. Proliferative diabetic retinopathy w/ DME, OU - s/p IVA OD #1 9.20.19, #2 (10.25.19), #3 (11.15.19), #4 (12.17.19), #5 (01.14.20), #6 (2.11.20) - s/p IVA OS #1 9.27.19, #2 (10.25.19), #3 (11.15.19), #4 (12.17.19), #5 (01.14.20), #6 (2.11.20) - OS doing well -- VA stable at 20/25 - HbA1c 7.7% (10.2.19) from 10.3% (6.26.19) - FA (9.20.19) shows +NVE OU and leaking MA and capillary nonperfusion - repeat FA 11.15.19 shows NV regressing OU - OCT confirms diabetic macular edema OU -- improving - S/P PRP OS (09.20.19) - S/P PRP OD (9.27.19 and 11.21.19) - inferior periphery obscured by VH  5,6. Hypertensive retinopathy OU - discussed importance of tight BP control. - monitor for now  7. Combined form age related cataract OU-  - The symptoms of  cataract, surgical options, and treatments and risks were discussed with patient. - discussed diagnosis and progression - not yet visually significant - monitor for now   Ophthalmic Meds Ordered this visit:  Meds ordered this encounter  Medications  . predniSONE (DELTASONE) 10 MG tablet    Sig: Take 1 tablet (10 mg total) by mouth daily.    Dispense:  30 tablet    Refill:  0  .  bacitracin-polymyxin b (POLYSPORIN) ophthalmic ointment    Sig: Place 1 application into the right eye at bedtime.    Dispense:  3.5 g    Refill:  2       Return in about 2 weeks (around 05/14/2018) for f/u Endophthalmitis OD, DFE, OCT.  There are no Patient Instructions on file for this visit.   Explained the diagnoses, plan, and follow up with the patient and they expressed understanding.  Patient expressed understanding of the importance of proper follow up care.   This document serves as a record of services personally performed by Gardiner Sleeper, MD, PhD. It was created on their behalf by Ernest Mallick, OA, an ophthalmic assistant. The creation of this record is the provider's dictation and/or activities during the visit.    Electronically signed by: Ernest Mallick, OA  03.01.2020 12:57 PM   Gardiner Sleeper, M.D., Ph.D. Diseases & Surgery of the Retina and Vitreous [facility]    I have reviewed the above documentation for accuracy and completeness, and I agree with the above. Gardiner Sleeper, M.D., Ph.D. 05/03/18 12:57 PM      Abbreviations: M myopia (nearsighted); A astigmatism; H hyperopia (farsighted); P presbyopia; Mrx spectacle prescription;  CTL contact lenses; OD right eye; OS left eye; OU both eyes  XT exotropia; ET esotropia; PEK punctate epithelial keratitis; PEE punctate epithelial erosions; DES dry eye syndrome; MGD meibomian gland dysfunction; ATs artificial tears; PFAT's preservative free artificial tears; [place] nuclear sclerotic cataract; PSC posterior  subcapsular cataract; ERM epi-retinal membrane; PVD posterior vitreous detachment; RD retinal detachment; DM diabetes mellitus; DR diabetic retinopathy; NPDR non-proliferative diabetic retinopathy; PDR proliferative diabetic retinopathy; CSME clinically significant macular edema; DME diabetic macular edema; dbh dot blot hemorrhages; CWS cotton wool spot; POAG primary open angle glaucoma; C/D cup-to-disc ratio; HVF humphrey visual field; GVF goldmann visual field; OCT optical coherence tomography; IOP intraocular pressure; BRVO Branch retinal vein occlusion; CRVO central retinal vein occlusion; CRAO central retinal artery occlusion; BRAO branch retinal artery occlusion; RT retinal tear; SB scleral buckle; PPV pars plana vitrectomy; VH Vitreous hemorrhage; PRP panretinal laser photocoagulation; IVK intravitreal kenalog; VMT vitreomacular traction; MH Macular hole;  NVD neovascularization of the disc; NVE neovascularization elsewhere; AREDS age related eye disease study; ARMD age related macular degeneration; POAG primary open angle glaucoma; EBMD epithelial/anterior basement membrane dystrophy; ACIOL anterior chamber intraocular lens; IOL intraocular lens; PCIOL posterior chamber intraocular lens; Phaco/IOL phacoemulsification with intraocular lens placement; [place] photorefractive keratectomy; LASIK laser assisted in situ keratomileusis; HTN hypertension; DM diabetes mellitus; COPD chronic obstructive pulmonary disease

## 2018-04-30 ENCOUNTER — Ambulatory Visit (INDEPENDENT_AMBULATORY_CARE_PROVIDER_SITE_OTHER): Payer: BLUE CROSS/BLUE SHIELD | Admitting: Ophthalmology

## 2018-04-30 DIAGNOSIS — H44001 Unspecified purulent endophthalmitis, right eye: Secondary | ICD-10-CM

## 2018-04-30 DIAGNOSIS — E113513 Type 2 diabetes mellitus with proliferative diabetic retinopathy with macular edema, bilateral: Secondary | ICD-10-CM

## 2018-04-30 DIAGNOSIS — H25813 Combined forms of age-related cataract, bilateral: Secondary | ICD-10-CM

## 2018-04-30 DIAGNOSIS — H4311 Vitreous hemorrhage, right eye: Secondary | ICD-10-CM

## 2018-04-30 DIAGNOSIS — H3581 Retinal edema: Secondary | ICD-10-CM

## 2018-04-30 DIAGNOSIS — I10 Essential (primary) hypertension: Secondary | ICD-10-CM

## 2018-04-30 DIAGNOSIS — H35033 Hypertensive retinopathy, bilateral: Secondary | ICD-10-CM

## 2018-04-30 MED ORDER — PREDNISONE 10 MG PO TABS: 10.0000 mg | ORAL_TABLET | Freq: Every day | ORAL | 0 refills | Status: DC

## 2018-04-30 MED ORDER — BACITRACIN-POLYMYXIN B 500-10000 UNIT/GM OP OINT: 1.0000 "application " | TOPICAL_OINTMENT | Freq: Every day | OPHTHALMIC | 2 refills | Status: DC

## 2018-05-03 ENCOUNTER — Encounter (INDEPENDENT_AMBULATORY_CARE_PROVIDER_SITE_OTHER): Payer: Self-pay | Admitting: Ophthalmology

## 2018-05-07 ENCOUNTER — Encounter (INDEPENDENT_AMBULATORY_CARE_PROVIDER_SITE_OTHER): Payer: BLUE CROSS/BLUE SHIELD | Admitting: Ophthalmology

## 2018-05-14 ENCOUNTER — Encounter (INDEPENDENT_AMBULATORY_CARE_PROVIDER_SITE_OTHER): Payer: Self-pay | Admitting: Ophthalmology

## 2018-05-14 ENCOUNTER — Ambulatory Visit (INDEPENDENT_AMBULATORY_CARE_PROVIDER_SITE_OTHER): Payer: BLUE CROSS/BLUE SHIELD | Admitting: Ophthalmology

## 2018-05-14 ENCOUNTER — Other Ambulatory Visit: Payer: Self-pay

## 2018-05-14 DIAGNOSIS — H4311 Vitreous hemorrhage, right eye: Secondary | ICD-10-CM

## 2018-05-14 DIAGNOSIS — H3581 Retinal edema: Secondary | ICD-10-CM

## 2018-05-14 DIAGNOSIS — E113513 Type 2 diabetes mellitus with proliferative diabetic retinopathy with macular edema, bilateral: Secondary | ICD-10-CM

## 2018-05-14 DIAGNOSIS — H25813 Combined forms of age-related cataract, bilateral: Secondary | ICD-10-CM

## 2018-05-14 DIAGNOSIS — I10 Essential (primary) hypertension: Secondary | ICD-10-CM

## 2018-05-14 DIAGNOSIS — H35033 Hypertensive retinopathy, bilateral: Secondary | ICD-10-CM

## 2018-05-14 DIAGNOSIS — H44001 Unspecified purulent endophthalmitis, right eye: Secondary | ICD-10-CM

## 2018-05-14 NOTE — Progress Notes (Signed)
[facility] Note  05/14/2018     CHIEF COMPLAINT Patient presents for Retina Follow Up   HISTORY OF PRESENT ILLNESS: Gloria Gomez is a 48 y.o. female who presents to the clinic today for:   HPI    Retina Follow Up    Patient presents with  Other.  In right eye.  Severity is moderate.  Duration of 2 weeks.  Since onset it is gradually improving.  I, the attending physician,  performed the HPI with the patient and updated documentation appropriately.          Comments    Patient states vision improving OD. Using durezol 6 X daily OD, in addition to brimonidine bid OD, polytrim ung QHS, and oral prednisone 10mg  daily. No eye pain. BS was 129 yesterday am. Last a1c was 7.9 in February 2019.        Last edited by Bernarda Caffey, MD on 05/14/2018  1:45 PM. (History)    pt states her right vision seems good  Referring physician: Tammi Sou, MD [address] [place], [place] [address]  HISTORICAL INFORMATION:   Selected notes from the MEDICAL RECORD NUMBER Referred for DM exam LEE:  Ocular Hx-NPDR PMH-DM (takes invokana and metformin), high cholesterol, benign brain tumor, white coat HTN    CURRENT MEDICATIONS: Current Outpatient Medications (Ophthalmic Drugs)  Medication Sig  . bacitracin-polymyxin b (POLYSPORIN) ophthalmic ointment Place 1 application into the right eye at bedtime.  . brimonidine (ALPHAGAN) 0.15 % ophthalmic solution Place 1 drop into the right eye 2 (two) times daily.  . Difluprednate 0.05 % EMUL Apply 1 drop to eye every hour while awake.  . prednisoLONE acetate (PRED FORTE) 1 % ophthalmic suspension Place 1 drop into the right eye every hour while awake. (Patient not taking: Reported on 05/14/2018)   No current facility-administered medications for this visit.  (Ophthalmic Drugs)   Current Outpatient Medications (Other)  Medication Sig  . glipiZIDE (GLUCOTROL XL) 10 MG 24 hr tablet TAKE 1 TABLET BY MOUTH WITH  BREAKFAST  . HYDROcodone-acetaminophen (NORCO/VICODIN) 5-325 MG tablet Take 1-2 tablets by mouth every 6 (six) hours as needed for moderate pain.  . INVOKANA 300 MG TABS tablet TAKE 1 TABLET BY MOUTH ONCE DAILY BEFORE  BREAKFAST  . losartan (COZAAR) 100 MG tablet Take 1 tablet (100 mg total) by mouth daily.  . metFORMIN (GLUCOPHAGE-XR) 500 MG 24 hr tablet TAKE 2 TABLETS BY MOUTH TWICE DAILY  . pantoprazole (PROTONIX) 40 MG tablet TAKE 1 TABLET BY MOUTH ONCE DAILY  . predniSONE (DELTASONE) 10 MG tablet Take 1 tablet (10 mg total) by mouth daily.  . promethazine (PHENERGAN) 12.5 MG tablet 1-2 tabs po q6h prn nausea  . temazepam (RESTORIL) 15 MG capsule Take 1 capsule (15 mg total) by mouth at bedtime as needed for sleep.   No current facility-administered medications for this visit.  (Other)      REVIEW OF SYSTEMS: ROS    Positive for: Endocrine, Eyes   Negative for: Constitutional, Gastrointestinal, Neurological, Skin, Genitourinary, Musculoskeletal, HENT, Cardiovascular, Respiratory, Psychiatric, Allergic/Imm, Heme/Lymph   Last edited by Roselee Nova D on 05/14/2018  1:25 PM. (History)       ALLERGIES Allergies  Allergen Reactions  . Gluten Meal Swelling  . Lisinopril Cough  . Pioglitazone Other (See Comments)    ELEVATED glucoses + worse chronic nausea    PAST MEDICAL HISTORY Past Medical History:  Diagnosis Date  . Abdominal bloating  likely from diab gastroparesis.  Dr. Havery Moros to do EGD as of 10/2017 GI eval.  . Benign brain tumor ([place])    Cystic lesion in cerebral aqueduct region with mild hydrocephalus-- stable MRI 02/2016.  Surveillance MRI 05/2017 --dilated cerebral aqueduct related to aqueductal stenosis and subsequent mild hydrocephalus (due to the 11 mm stable cystic lesion in cerebral aqueduct---?congenitial?.  . Dysmenorrhea    vicodin occ during first 2 days of cycle.  . Gluten intolerance    pt reports she underwent full GI w/u to r/o celiac dz  . Hepatic  steatosis    ultrasound 08/2017  . Hyperlipidemia, mixed   . Hypertensive retinopathy of both eyes   . Proliferative diabetic retinopathy of both eyes ([place])    steroid injections 10/2017--improved  . Sensorineural hearing loss of left ear    Sudden left hearing loss summer 2016--no improvement with steroids 01/2015 so brain MRI done by Dr. Redmond Baseman and it showed brain tumor that was determined to be benign.  Pt's hearing not bad enough for hearing aid as of 06/2016.  . Type 2 diabetes with complication ([place])    diab retpthy, diabetic gastroparesis (gastric emptying study mildly abnl 03/2017)  . White coat hypertension    Past Surgical History:  Procedure Laterality Date  . CHOLECYSTECTOMY  2000  . GASTRIC EMPTYING SCAN  04/20/2017   Mildly abnormal, particularly the 1st hour of emptying.  Marland Kitchen PARS PLANA VITRECTOMY Right 04/12/2018   Procedure: Right PARS PLANA VITRECTOMY WITH 25 GAUGE with intravitreal antibiotics;  Surgeon: Bernarda Caffey, MD;  Location: [facility];  Service: Ophthalmology;  Laterality: Right;    FAMILY HISTORY Family History  Problem Relation Age of Onset  . Brain cancer Mother   . Diabetes Father   . Diabetes Maternal Grandmother   . Cataracts Maternal Grandmother   . Cervical cancer Paternal Grandmother   . Colon cancer Maternal Grandfather 68  . Amblyopia Neg Hx   . Blindness Neg Hx   . Glaucoma Neg Hx   . Macular degeneration Neg Hx   . Retinal detachment Neg Hx   . Strabismus Neg Hx   . Retinitis pigmentosa Neg Hx     SOCIAL HISTORY Social History   Tobacco Use  . Smoking status: Never Smoker  . Smokeless tobacco: Never Used  Substance Use Topics  . Alcohol use: No  . Drug use: No         OPHTHALMIC EXAM:  Base Eye Exam    Visual Acuity (Snellen - Linear)      Right Left   Dist cc 20/30 -1 20/20   Dist ph cc NI    Correction:  Glasses       Tonometry (Tonopen, 1:37 PM)      Right Left   Pressure 16 18       Pupils      Dark Light Shape  React APD   Right 4 3 Round Slow None   Left 4 3 Round Brisk None       Visual Fields (Counting fingers)      Left Right    Full Full       Extraocular Movement      Right Left    Full, Ortho Full, Ortho       Neuro/Psych    Oriented x3:  Yes   Mood/Affect:  Normal       Dilation    Both eyes:  1.0% Mydriacyl, 2.5% Phenylephrine @ 1:37 PM  Slit Lamp and Fundus Exam    Slit Lamp Exam      Right Left   Lids/Lashes Dermatochalasis - upper lid, Meibomian gland dysfunction, Telangiectasia Dermatochalasis - upper lid, Meibomian gland dysfunction, Telangiectasia   Conjunctiva/Sclera [place] ST quadran - improvingt; sutures intact White and quiet   Cornea Clear, mild endopigment inferior Clear   Anterior Chamber Clear, no Cell/pigment, no hypopyon  Deep and quiet   Iris Round and dilated, No NVI Round and dilated, No NVI   Lens 2+ Nuclear sclerosis, 2+ Cortical cataract, 1+Posterior subcapsular cataract 2+ Nuclear sclerosis, 2+ Cortical cataract   Vitreous post vitrectomy; improving cell, scattered vitreous debris - clearing and settling inferiorly, vitreous condensations - improving Vitreous syneresis, residual VH settled inferiorly       Fundus Exam      Right Left   Disc Pink and Sharp, mild fibrosis over the disc and extending along ST arcades Regressed NVE and fibrosis, compact   C/D Ratio 0.0 0.1   Macula Blunted foveal reflex, mild MA and exudates temporal to fovea good foveal reflex, cluster of MA's and exudates temporal macula   Vessels Vascular attenuation, fibrosis along ST arcades Vascular attenuation, mild Tortuousity   Periphery Attached, scattered PRP - room for inferior fill-in Attached, scattered DBH, good 360 PRP laser changes, room for fill in posteriorly.         Refraction    Wearing Rx      Sphere Cylinder   Right -3.75 Sphere   Left -3.75 Sphere          IMAGING AND PROCEDURES  Imaging and Procedures for @TODAY @  OCT, Retina - OU - Both  Eyes       Right Eye Quality was good. Central Foveal Thickness: 341. Progression has improved. Findings include abnormal foveal contour, epiretinal membrane, intraretinal fluid, no SRF, intraretinal hyper-reflective material, vitreous traction, preretinal fibrosis (Interval improvement in IRF/vitreous opacitites; persistent DME).   Left Eye Quality was good. Central Foveal Thickness: 293. Progression has been stable. Findings include intraretinal fluid, no SRF, normal foveal contour, intraretinal hyper-reflective material, epiretinal membrane (Persistent cystic changes -- remains mild).   Notes *Images captured and stored on drive  Diagnosis / Impression:  OD: persistent DME; interval improvement in vitreous opacities / IRF OS: abnormal foveal contour; +IRF; preretinal fibrosis about the disc; Persistent cystic changes -- remains mild  Clinical management:  See below  Abbreviations: NFP - Normal foveal profile. CME - cystoid macular edema. PED - pigment epithelial detachment. IRF - intraretinal fluid. SRF - subretinal fluid. EZ - ellipsoid zone. ERM - epiretinal membrane. ORA - outer retinal atrophy. ORT - outer retinal tubulation. SRHM - subretinal hyper-reflective material                 ASSESSMENT/PLAN:    ICD-10-CM   1. Right endophthalmia H44.001   2. Vitreous hemorrhage of right eye ([place]) H43.11   3. Proliferative diabetic retinopathy of both eyes with macular edema associated with type 2 diabetes mellitus ([place]) T51.7616   4. Retinal edema H35.81 OCT, Retina - OU - Both Eyes  5. Hypertensive retinopathy of both eyes H35.033   6. Essential hypertension I10   7. Combined forms of age-related cataract of both eyes H25.813     1. Endophthalmitis OD - s/p IVA OU 04/09/2018 - s/p 25g PPV w/ intravitreal vanc, ceftaz and cefepime OD, 2.14.2020 - s/p intravitreal tap / vanc and ceflaz injections (02.16.20)             -  doing well, BCVA 20/30!!!             -  inflammation/posterior debris improving further   - corneal edema improved             - IOP 15             - gram stain (2.14.20) shows G+ cocci, WBCs mostly PMNs;   - repeat gram stain from t/i (2.16.20) -- no organisms, just WBCs             - cultures from vitreous grew rare Staph warneri; cultures from t/i -- no growth             - decrease  Durezol QID OD  - cont Brimonidine BID OD  - PSO PRN             - decrease PO prednisone to 5mg  daily for 5 days, then 2.5mg  for 5 days             - keep head elevated             - post op drop instructions reviewed             - tylenol/ibuprofen for pain - f/u 3 weeks  2. Vitreous Hemorrhage OD -- cleared from PPV for endophthalmitis - secondary to PDR as described below - S/P IVA OD #1 (09.20.19), #2 (10.25.19), #3 (11.15.19), #4 (12.16.19), #5 (03/12/2018) - S/P PRP OD #1 (09.27.19), fill in OD (11.21.19) -- each somewhat limited inferiorly by residual VH  3,4. Proliferative diabetic retinopathy w/ DME, OU - s/p IVA OD #1 9.20.19, #2 (10.25.19), #3 (11.15.19), #4 (12.17.19), #5 (01.14.20), #6 (2.11.20) - s/p IVA OS #1 9.27.19, #2 (10.25.19), #3 (11.15.19), #4 (12.17.19), #5 (01.14.20), #6 (2.11.20) - OS doing well -- VA stable at 20/25 - HbA1c 7.7% (10.2.19) from 10.3% (6.26.19) - FA (9.20.19) shows +NVE OU and leaking MA and capillary nonperfusion - repeat FA 11.15.19 shows NV regressing OU - OCT confirms diabetic macular edema OU -- improving - S/P PRP OS (09.20.19) - S/P PRP OD (9.27.19 and 11.21.19) - inferior periphery obscured by VH  5,6. Hypertensive retinopathy OU - discussed importance of tight BP control. - monitor for now  7. Combined form age related cataract OU-  - The symptoms of cataract, surgical options, and treatments and risks were discussed with patient. - discussed diagnosis and progression - not yet visually significant - monitor for now   Ophthalmic Meds Ordered this visit:  No orders of the  defined types were placed in this encounter.      Return in about 3 weeks (around 06/04/2018) for f/u endophthalmitis OD, DFE, OCT.  There are no Patient Instructions on file for this visit.   Explained the diagnoses, plan, and follow up with the patient and they expressed understanding.  Patient expressed understanding of the importance of proper follow up care.   This document serves as a record of services personally performed by Gardiner Sleeper, MD, PhD. It was created on their behalf by Ernest Mallick, OA, an ophthalmic assistant. The creation of this record is the provider's dictation and/or activities during the visit.    Electronically signed by: Ernest Mallick, OA  03.17.2020 2:14 PM    Gardiner Sleeper, M.D., Ph.D. Diseases & Surgery of the Retina and Vitreous [facility]  I have reviewed the above documentation for accuracy and completeness, and I agree with the above. Gardiner Sleeper, M.D., Ph.D.  05/14/18 2:17 PM    Abbreviations: M myopia (nearsighted); A astigmatism; H hyperopia (farsighted); P presbyopia; Mrx spectacle prescription;  CTL contact lenses; OD right eye; OS left eye; OU both eyes  XT exotropia; ET esotropia; PEK punctate epithelial keratitis; PEE punctate epithelial erosions; DES dry eye syndrome; MGD meibomian gland dysfunction; ATs artificial tears; PFAT's preservative free artificial tears; [place] nuclear sclerotic cataract; PSC posterior subcapsular cataract; ERM epi-retinal membrane; PVD posterior vitreous detachment; RD retinal detachment; DM diabetes mellitus; DR diabetic retinopathy; NPDR non-proliferative diabetic retinopathy; PDR proliferative diabetic retinopathy; CSME clinically significant macular edema; DME diabetic macular edema; dbh dot blot hemorrhages; CWS cotton wool spot; POAG primary open angle glaucoma; C/D cup-to-disc ratio; HVF humphrey visual field; GVF goldmann visual field; OCT optical coherence tomography; IOP intraocular  pressure; BRVO Branch retinal vein occlusion; CRVO central retinal vein occlusion; CRAO central retinal artery occlusion; BRAO branch retinal artery occlusion; RT retinal tear; SB scleral buckle; PPV pars plana vitrectomy; VH Vitreous hemorrhage; PRP panretinal laser photocoagulation; IVK intravitreal kenalog; VMT vitreomacular traction; MH Macular hole;  NVD neovascularization of the disc; NVE neovascularization elsewhere; AREDS age related eye disease study; ARMD age related macular degeneration; POAG primary open angle glaucoma; EBMD epithelial/anterior basement membrane dystrophy; ACIOL anterior chamber intraocular lens; IOL intraocular lens; PCIOL posterior chamber intraocular lens; Phaco/IOL phacoemulsification with intraocular lens placement; [place] photorefractive keratectomy; LASIK laser assisted in situ keratomileusis; HTN hypertension; DM diabetes mellitus; COPD chronic obstructive pulmonary disease

## 2018-05-15 ENCOUNTER — Other Ambulatory Visit (INDEPENDENT_AMBULATORY_CARE_PROVIDER_SITE_OTHER): Payer: Self-pay

## 2018-05-15 MED ORDER — BRIMONIDINE TARTRATE 0.15 % OP SOLN: 1.0000 [drp] | Freq: Two times a day (BID) | OPHTHALMIC | 3 refills | Status: DC

## 2018-05-21 ENCOUNTER — Other Ambulatory Visit: Payer: Self-pay | Admitting: Family Medicine

## 2018-05-21 ENCOUNTER — Other Ambulatory Visit: Payer: Self-pay

## 2018-06-05 ENCOUNTER — Other Ambulatory Visit: Payer: Self-pay | Admitting: Family Medicine

## 2018-06-11 ENCOUNTER — Encounter (INDEPENDENT_AMBULATORY_CARE_PROVIDER_SITE_OTHER): Payer: Self-pay | Admitting: Ophthalmology

## 2018-06-11 ENCOUNTER — Ambulatory Visit (INDEPENDENT_AMBULATORY_CARE_PROVIDER_SITE_OTHER): Payer: BLUE CROSS/BLUE SHIELD | Admitting: Ophthalmology

## 2018-06-11 ENCOUNTER — Encounter (INDEPENDENT_AMBULATORY_CARE_PROVIDER_SITE_OTHER): Payer: BLUE CROSS/BLUE SHIELD | Admitting: Ophthalmology

## 2018-06-11 ENCOUNTER — Other Ambulatory Visit: Payer: Self-pay

## 2018-06-11 DIAGNOSIS — H3581 Retinal edema: Secondary | ICD-10-CM

## 2018-06-11 DIAGNOSIS — E113513 Type 2 diabetes mellitus with proliferative diabetic retinopathy with macular edema, bilateral: Secondary | ICD-10-CM | POA: Diagnosis not present

## 2018-06-11 DIAGNOSIS — H44001 Unspecified purulent endophthalmitis, right eye: Secondary | ICD-10-CM

## 2018-06-11 DIAGNOSIS — H35033 Hypertensive retinopathy, bilateral: Secondary | ICD-10-CM

## 2018-06-11 DIAGNOSIS — H25813 Combined forms of age-related cataract, bilateral: Secondary | ICD-10-CM

## 2018-06-11 DIAGNOSIS — H4311 Vitreous hemorrhage, right eye: Secondary | ICD-10-CM

## 2018-06-11 DIAGNOSIS — I10 Essential (primary) hypertension: Secondary | ICD-10-CM

## 2018-06-11 NOTE — Progress Notes (Signed)
[facility] Note  06/11/2018     CHIEF COMPLAINT Patient presents for Retina Follow Up   HISTORY OF PRESENT ILLNESS: Gloria Gomez is a 48 y.o. female who presents to the clinic today for:   HPI    Retina Follow Up    Patient presents with  Other (endophthalmitis).  In right eye.  Duration of 4 weeks.  I, the attending physician,  performed the HPI with the patient and updated documentation appropriately.          Comments    Patient states vision the same OU. Taking durezol qid OD instead of Pred Forte. Still using brimonidine bid OD.       Last edited by Bernarda Caffey, MD on 06/11/2018 11:09 AM. (History)    pt states her vision is doing well, she states her blood sugar has finally gotten back under control since last Friday, she states it was 129 this morning  Referring physician: Tammi Sou, MD [address] [place], [place] [address]  HISTORICAL INFORMATION:   Selected notes from the MEDICAL RECORD NUMBER Referred for DM exam LEE:  Ocular Hx-NPDR PMH-DM (takes invokana and metformin), high cholesterol, benign brain tumor, white coat HTN    CURRENT MEDICATIONS: Current Outpatient Medications (Ophthalmic Drugs)  Medication Sig  . brimonidine (ALPHAGAN) 0.15 % ophthalmic solution Place 1 drop into the right eye 2 (two) times daily.  . Difluprednate 0.05 % EMUL Apply 1 drop to eye every hour while awake.  . bacitracin-polymyxin b (POLYSPORIN) ophthalmic ointment Place 1 application into the right eye at bedtime. (Patient not taking: Reported on 06/11/2018)  . prednisoLONE acetate (PRED FORTE) 1 % ophthalmic suspension Place 1 drop into the right eye every hour while awake. (Patient not taking: Reported on 06/11/2018)   No current facility-administered medications for this visit.  (Ophthalmic Drugs)   Current Outpatient Medications (Other)  Medication Sig  . glipiZIDE (GLUCOTROL XL) 10 MG 24 hr tablet TAKE 1 TABLET BY MOUTH WITH  BREAKFAST  . HYDROcodone-acetaminophen (NORCO/VICODIN) 5-325 MG tablet Take 1-2 tablets by mouth every 6 (six) hours as needed for moderate pain.  . INVOKANA 300 MG TABS tablet TAKE 1 TABLET BY MOUTH ONCE DAILY BEFORE  BREAKFAST  . losartan (COZAAR) 100 MG tablet Take 1 tablet (100 mg total) by mouth daily.  . metFORMIN (GLUCOPHAGE-XR) 500 MG 24 hr tablet Take 2 tablets by mouth twice daily  . pantoprazole (PROTONIX) 40 MG tablet Take 1 tablet by mouth once daily  . predniSONE (DELTASONE) 10 MG tablet Take 1 tablet (10 mg total) by mouth daily.  . promethazine (PHENERGAN) 12.5 MG tablet 1-2 tabs po q6h prn nausea  . temazepam (RESTORIL) 15 MG capsule Take 1 capsule (15 mg total) by mouth at bedtime as needed for sleep.   No current facility-administered medications for this visit.  (Other)      REVIEW OF SYSTEMS: ROS    Positive for: Endocrine, Eyes   Negative for: Constitutional, Gastrointestinal, Neurological, Skin, Genitourinary, Musculoskeletal, HENT, Cardiovascular, Respiratory, Psychiatric, Allergic/Imm, Heme/Lymph   Last edited by Roselee Nova D on 06/11/2018 10:07 AM. (History)       ALLERGIES Allergies  Allergen Reactions  . Gluten Meal Swelling  . Lisinopril Cough  . Pioglitazone Other (See Comments)    ELEVATED glucoses + worse chronic nausea    PAST MEDICAL HISTORY Past Medical History:  Diagnosis Date  . Abdominal bloating    likely from diab gastroparesis.  Dr.  Armbruster to do EGD as of 10/2017 GI eval.  . Benign brain tumor ([place])    Cystic lesion in cerebral aqueduct region with mild hydrocephalus-- stable MRI 02/2016.  Surveillance MRI 05/2017 --dilated cerebral aqueduct related to aqueductal stenosis and subsequent mild hydrocephalus (due to the 11 mm stable cystic lesion in cerebral aqueduct---?congenitial?.  . Dysmenorrhea    vicodin occ during first 2 days of cycle.  . Gluten intolerance    pt reports she underwent full GI w/u to r/o celiac dz  . Hepatic  steatosis    ultrasound 08/2017  . Hyperlipidemia, mixed   . Hypertensive retinopathy of both eyes   . Proliferative diabetic retinopathy of both eyes ([place])    steroid injections 10/2017--improved  . Sensorineural hearing loss of left ear    Sudden left hearing loss summer 2016--no improvement with steroids 01/2015 so brain MRI done by Dr. Redmond Baseman and it showed brain tumor that was determined to be benign.  Pt's hearing not bad enough for hearing aid as of 06/2016.  . Type 2 diabetes with complication ([place])    diab retpthy, diabetic gastroparesis (gastric emptying study mildly abnl 03/2017)  . White coat hypertension    Past Surgical History:  Procedure Laterality Date  . CHOLECYSTECTOMY  2000  . GASTRIC EMPTYING SCAN  04/20/2017   Mildly abnormal, particularly the 1st hour of emptying.  Marland Kitchen PARS PLANA VITRECTOMY Right 04/12/2018   Procedure: Right PARS PLANA VITRECTOMY WITH 25 GAUGE with intravitreal antibiotics;  Surgeon: Bernarda Caffey, MD;  Location: [facility];  Service: Ophthalmology;  Laterality: Right;    FAMILY HISTORY Family History  Problem Relation Age of Onset  . Brain cancer Mother   . Diabetes Father   . Diabetes Maternal Grandmother   . Cataracts Maternal Grandmother   . Cervical cancer Paternal Grandmother   . Colon cancer Maternal Grandfather 69  . Amblyopia Neg Hx   . Blindness Neg Hx   . Glaucoma Neg Hx   . Macular degeneration Neg Hx   . Retinal detachment Neg Hx   . Strabismus Neg Hx   . Retinitis pigmentosa Neg Hx     SOCIAL HISTORY Social History   Tobacco Use  . Smoking status: Never Smoker  . Smokeless tobacco: Never Used  Substance Use Topics  . Alcohol use: No  . Drug use: No         OPHTHALMIC EXAM:  Base Eye Exam    Visual Acuity (Snellen - Linear)      Right Left   Dist cc 20/25 20/20 -2   Dist ph cc NI    Correction:  Glasses       Tonometry (Tonopen, 10:18 AM)      Right Left   Pressure 14 17       Pupils      Dark Light Shape  React APD   Right 4 3 Round Slow None   Left 4 3 Round Brisk None       Visual Fields (Counting fingers)      Left Right    Full Full       Extraocular Movement      Right Left    Full, Ortho Full, Ortho       Neuro/Psych    Oriented x3:  Yes   Mood/Affect:  Normal       Dilation    Both eyes:  1.0% Mydriacyl, 2.5% Phenylephrine @ 10:19 AM        Slit Lamp and Fundus  Exam    Slit Lamp Exam      Right Left   Lids/Lashes Dermatochalasis - upper lid, Meibomian gland dysfunction, Telangiectasia Dermatochalasis - upper lid, Meibomian gland dysfunction, Telangiectasia   Conjunctiva/Sclera White and quiet White and quiet   Cornea Clear, mild endopigment inferior, 1+ Punctate epithelial erosions 1+ Punctate epithelial erosions   Anterior Chamber Clear, no Cell/pigment, no hypopyon  Deep and quiet   Iris Round and dilated, No NVI Round and dilated, No NVI   Lens 2+ Nuclear sclerosis, 2+ Cortical cataract, 1+Posterior subcapsular cataract 2+ Nuclear sclerosis, 2+ Cortical cataract   Vitreous post vitrectomy; improving cell, scattered vitreous debris - clearing and settling inferiorly, vitreous condensations - improving Vitreous syneresis, mild VH clearing and settling inferiorly       Fundus Exam      Right Left   Disc Pink and Sharp, mild fibrosis superiorly with fine NVD and traction extending along ST arcade Pink and sharp   C/D Ratio 0.1 0.1   Macula Blunted foveal reflex, cluster of exudates temporal macula, scattered MA good foveal reflex, mild scattered MA's and exudates greatest temporal macula   Vessels mild Vascular attenuation, tractional fibrosis along ST arcades Vascular attenuation, mild Tortuousity   Periphery Attached, scattered PRP - room for inferior fill-in Attached, scattered DBH, good 360 PRP laser changes, room for fill in posteriorly.           IMAGING AND PROCEDURES  Imaging and Procedures for @TODAY @  OCT, Retina - OU - Both Eyes       Right  Eye Quality was good. Central Foveal Thickness: 340. Progression has worsened. Findings include abnormal foveal contour, epiretinal membrane, intraretinal fluid, no SRF, intraretinal hyper-reflective material, vitreous traction, preretinal fibrosis (Mild interval increase in IRF/IRHM, mild vitreous traction along ST arcades).   Left Eye Quality was good. Central Foveal Thickness: 298. Progression has worsened. Findings include intraretinal fluid, no SRF, normal foveal contour, intraretinal hyper-reflective material, epiretinal membrane (Mild interval increase in IRF/IRHM).   Notes *Images captured and stored on drive  Diagnosis / Impression:  OD: persistent DME -- mild inc in IRF/IRHM; interval improvement in vitreous opacities OS: abnormal foveal contour; +IRF; preretinal fibrosis about the disc; Persistent cystic changes -- remains mild  Clinical management:  See below  Abbreviations: NFP - Normal foveal profile. CME - cystoid macular edema. PED - pigment epithelial detachment. IRF - intraretinal fluid. SRF - subretinal fluid. EZ - ellipsoid zone. ERM - epiretinal membrane. ORA - outer retinal atrophy. ORT - outer retinal tubulation. SRHM - subretinal hyper-reflective material         Panretinal Photocoagulation - OD - Right Eye       LASER PROCEDURE NOTE  Diagnosis:   Proliferative Diabetic Retinopathy, RIGHT EYE  Procedure:  Pan-retinal photocoagulation using slit lamp laser, RIGHT EYE, fill-in  Anesthesia:  Topical  Surgeon: Bernarda Caffey, MD, PhD   Informed consent obtained, operative eye marked, and time out performed prior to initiation of laser.   Lumenis [id number] slit lamp laser Pattern: 3x3 square Power: 340 mW Duration: 30 msec  Spot size: 200 microns  # spots: 0737 spots  Complications: None.  Notes: mild residual vitreous heme obscuring view and preventing laser up take inferior periphery  RTC: 4-6 wks   Patient tolerated the procedure well and  received written and verbal post-procedure care information/education.                 ASSESSMENT/PLAN:    ICD-10-CM   1. Right endophthalmia  H44.001   2. Vitreous hemorrhage of right eye ([place]) H43.11   3. Proliferative diabetic retinopathy of both eyes with macular edema associated with type 2 diabetes mellitus ([place]) L27.5170 Panretinal Photocoagulation - OD - Right Eye  4. Retinal edema H35.81 OCT, Retina - OU - Both Eyes  5. Hypertensive retinopathy of both eyes H35.033   6. Essential hypertension I10   7. Combined forms of age-related cataract of both eyes H25.813     1. Endophthalmitis OD - s/p IVA OU 04/09/2018 - s/p 25g PPV w/ intravitreal vanc, ceftaz and cefepime OD, 2.14.2020 - s/p intravitreal tap / vanc and ceflaz injections (02.16.20)             - doing well, BCVA 20/25!!!             - inflammation/posterior debris improving further   - corneal edema improved             - IOP 14             - gram stain (2.14.20) shows G+ cocci, WBCs mostly PMNs;   - repeat gram stain from t/i (2.16.20) -- no organisms, just WBCs             - cultures from vitreous grew rare Staph warneri; cultures from t/i -- no growth             - continue Durezol QID OD  - cont Brimonidine Qdaily OD  - PSO PRN  - completed po pred taper -- caused significant elevations in BG             - keep head elevated             - post op drop instructions reviewed             - tylenol/ibuprofen for pain - f/u 4-6 weeks  2. Vitreous Hemorrhage OD -- cleared from PPV for endophthalmitis - secondary to PDR as described below - S/P IVA OD #1 (09.20.19), #2 (10.25.19), #3 (11.15.19), #4 (12.16.19), #5 (03/12/2018) - S/P PRP OD #1 (09.27.19), fill in OD (11.21.19) -- each somewhat limited inferiorly by residual VH  3,4. Proliferative diabetic retinopathy w/ DME, OU - s/p IVA OD #1 9.20.19, #2 (10.25.19), #3 (11.15.19), #4 (12.17.19), #5 (01.14.20), #6 (2.11.20) - s/p IVA OS #1 9.27.19, #2  (10.25.19), #3 (11.15.19), #4 (12.17.19), #5 (01.14.20), #6 (2.11.20) - S/P PRP OS (09.20.19) - S/P PRP OD (9.27.19 and 11.21.19)  - OS doing well -- VA stable at 20/25 - HbA1c 7.7% (10.2.19) from 10.3% (6.26.19) - FA (9.20.19) shows +NVE OU and leaking MA and capillary nonperfusion - repeat FA 11.15.19 shows NV regressing OU - today exam shows room for PRP fill in inferiorly OD now that it is post vitrectomy - exam also shows early tractional fibrosis along ST arcades on fine NVD superior disc - OCT shows mild interval increase in diabetic macular edema and IRHM OU - recommend PRP fill-in OD, today 04.14.20 - RBA of procedure discussed, questions answered - informed consent obtained and signed - see procedure note - pt reports significantly high BGs while on po prednisone -- frequently over 300 -- but now off po pred - f/u 4-6 wks  5,6. Hypertensive retinopathy OU - discussed importance of tight BP control. - monitor for now  7. Combined form age related cataract OU-  - The symptoms of cataract, surgical options, and treatments and risks were discussed with patient. - discussed diagnosis and progression - not  yet visually significant - monitor for now   Ophthalmic Meds Ordered this visit:  No orders of the defined types were placed in this encounter.      Return for 4-6 wks, Dilated Exam, OCT.  There are no Patient Instructions on file for this visit.   Explained the diagnoses, plan, and follow up with the patient and they expressed understanding.  Patient expressed understanding of the importance of proper follow up care.   This document serves as a record of services personally performed by Gardiner Sleeper, MD, PhD. It was created on their behalf by Ernest Mallick, OA, an ophthalmic assistant. The creation of this record is the provider's dictation and/or activities during the visit.    Electronically signed by: Ernest Mallick, OA 04.14.2020 12:36 AM    Gardiner Sleeper,  M.D., Ph.D. Diseases & Surgery of the Retina and Vitreous [facility]  I have reviewed the above documentation for accuracy and completeness, and I agree with the above. Gardiner Sleeper, M.D., Ph.D. 06/12/18 12:42 AM    Abbreviations: M myopia (nearsighted); A astigmatism; H hyperopia (farsighted); P presbyopia; Mrx spectacle prescription;  CTL contact lenses; OD right eye; OS left eye; OU both eyes  XT exotropia; ET esotropia; PEK punctate epithelial keratitis; PEE punctate epithelial erosions; DES dry eye syndrome; MGD meibomian gland dysfunction; ATs artificial tears; PFAT's preservative free artificial tears; [place] nuclear sclerotic cataract; PSC posterior subcapsular cataract; ERM epi-retinal membrane; PVD posterior vitreous detachment; RD retinal detachment; DM diabetes mellitus; DR diabetic retinopathy; NPDR non-proliferative diabetic retinopathy; PDR proliferative diabetic retinopathy; CSME clinically significant macular edema; DME diabetic macular edema; dbh dot blot hemorrhages; CWS cotton wool spot; POAG primary open angle glaucoma; C/D cup-to-disc ratio; HVF humphrey visual field; GVF goldmann visual field; OCT optical coherence tomography; IOP intraocular pressure; BRVO Branch retinal vein occlusion; CRVO central retinal vein occlusion; CRAO central retinal artery occlusion; BRAO branch retinal artery occlusion; RT retinal tear; SB scleral buckle; PPV pars plana vitrectomy; VH Vitreous hemorrhage; PRP panretinal laser photocoagulation; IVK intravitreal kenalog; VMT vitreomacular traction; MH Macular hole;  NVD neovascularization of the disc; NVE neovascularization elsewhere; AREDS age related eye disease study; ARMD age related macular degeneration; POAG primary open angle glaucoma; EBMD epithelial/anterior basement membrane dystrophy; ACIOL anterior chamber intraocular lens; IOL intraocular lens; PCIOL posterior chamber intraocular lens; Phaco/IOL phacoemulsification with  intraocular lens placement; [place] photorefractive keratectomy; LASIK laser assisted in situ keratomileusis; HTN hypertension; DM diabetes mellitus; COPD chronic obstructive pulmonary disease

## 2018-06-20 ENCOUNTER — Other Ambulatory Visit: Payer: Self-pay | Admitting: Family Medicine

## 2018-06-23 ENCOUNTER — Encounter (INDEPENDENT_AMBULATORY_CARE_PROVIDER_SITE_OTHER): Payer: Self-pay | Admitting: Ophthalmology

## 2018-06-23 ENCOUNTER — Ambulatory Visit (INDEPENDENT_AMBULATORY_CARE_PROVIDER_SITE_OTHER): Payer: BLUE CROSS/BLUE SHIELD | Admitting: Ophthalmology

## 2018-06-23 DIAGNOSIS — I10 Essential (primary) hypertension: Secondary | ICD-10-CM

## 2018-06-23 DIAGNOSIS — H4311 Vitreous hemorrhage, right eye: Secondary | ICD-10-CM | POA: Diagnosis not present

## 2018-06-23 DIAGNOSIS — H35033 Hypertensive retinopathy, bilateral: Secondary | ICD-10-CM

## 2018-06-23 DIAGNOSIS — H4312 Vitreous hemorrhage, left eye: Secondary | ICD-10-CM

## 2018-06-23 DIAGNOSIS — H25813 Combined forms of age-related cataract, bilateral: Secondary | ICD-10-CM

## 2018-06-23 DIAGNOSIS — E113513 Type 2 diabetes mellitus with proliferative diabetic retinopathy with macular edema, bilateral: Secondary | ICD-10-CM

## 2018-06-23 DIAGNOSIS — H44001 Unspecified purulent endophthalmitis, right eye: Secondary | ICD-10-CM | POA: Diagnosis not present

## 2018-06-23 MED ORDER — BEVACIZUMAB CHEMO INJECTION 1.25MG/0.05ML SYRINGE FOR KALEIDOSCOPE
1.2500 mg | INTRAVITREAL | Status: AC | PRN
  Administered 2018-06-23: 1.25 mg via INTRAVITREAL

## 2018-06-23 NOTE — Progress Notes (Signed)
[facility] Note  06/23/2018     CHIEF COMPLAINT Patient presents for Retina Evaluation   HISTORY OF PRESENT ILLNESS: Gloria Gomez is a 48 y.o. female who presents to the clinic today for:   HPI    Retina Evaluation    In left eye.  This started 2 days ago.  Duration of 2.  Associated Symptoms Floaters and Trauma.  Negative for Distortion, Flashes, Pain, Redness and Photophobia.  I, the attending physician,  performed the HPI with the patient and updated documentation appropriately.          Comments    Friday at 4 pm, pt noted increased smokiness in vision w/ black squiggly lines OS. Pt reports blurriness has progressed. No eye pain, photopsias. Pt reports hard fall backwards last week.        Last edited by Bernarda Caffey, MD on 06/23/2018  2:33 PM. (History)    pt states her vision is doing well, she states her blood sugar has finally gotten back under control since last Friday, she states it was 129 this morning  Referring physician: Tammi Sou, MD [address] [place], [place] [address]  HISTORICAL INFORMATION:   Selected notes from the MEDICAL RECORD NUMBER Referred for DM exam LEE:  Ocular Hx-NPDR PMH-DM (takes invokana and metformin), high cholesterol, benign brain tumor, white coat HTN    CURRENT MEDICATIONS: Current Outpatient Medications (Ophthalmic Drugs)  Medication Sig  . bacitracin-polymyxin b (POLYSPORIN) ophthalmic ointment Place 1 application into the right eye at bedtime. (Patient not taking: Reported on 06/11/2018)  . brimonidine (ALPHAGAN) 0.15 % ophthalmic solution Place 1 drop into the right eye 2 (two) times daily.  . Difluprednate 0.05 % EMUL Apply 1 drop to eye every hour while awake.  . prednisoLONE acetate (PRED FORTE) 1 % ophthalmic suspension Place 1 drop into the right eye every hour while awake. (Patient not taking: Reported on 06/11/2018)   No current facility-administered medications for this  visit.  (Ophthalmic Drugs)   Current Outpatient Medications (Other)  Medication Sig  . glipiZIDE (GLUCOTROL XL) 10 MG 24 hr tablet TAKE 1 TABLET BY MOUTH WITH BREAKFAST  . HYDROcodone-acetaminophen (NORCO/VICODIN) 5-325 MG tablet Take 1-2 tablets by mouth every 6 (six) hours as needed for moderate pain.  . INVOKANA 300 MG TABS tablet TAKE 1 TABLET BY MOUTH ONCE DAILY BEFORE  BREAKFAST  . losartan (COZAAR) 100 MG tablet Take 1 tablet (100 mg total) by mouth daily.  . metFORMIN (GLUCOPHAGE-XR) 500 MG 24 hr tablet Take 2 tablets by mouth twice daily  . pantoprazole (PROTONIX) 40 MG tablet Take 1 tablet by mouth once daily  . predniSONE (DELTASONE) 10 MG tablet Take 1 tablet (10 mg total) by mouth daily.  . promethazine (PHENERGAN) 12.5 MG tablet 1-2 tabs po q6h prn nausea  . temazepam (RESTORIL) 15 MG capsule Take 1 capsule (15 mg total) by mouth at bedtime as needed for sleep.   No current facility-administered medications for this visit.  (Other)      REVIEW OF SYSTEMS: ROS    Positive for: Endocrine, Eyes   Negative for: Constitutional, Gastrointestinal, Neurological, Skin, Genitourinary, Musculoskeletal, HENT, Cardiovascular, Respiratory, Psychiatric, Allergic/Imm, Heme/Lymph   Last edited by Bernarda Caffey, MD on 06/23/2018  1:30 PM. (History)       ALLERGIES Allergies  Allergen Reactions  . Gluten Meal Swelling  . Lisinopril Cough  . Pioglitazone Other (See Comments)    ELEVATED glucoses + worse chronic  nausea    PAST MEDICAL HISTORY Past Medical History:  Diagnosis Date  . Abdominal bloating    likely from diab gastroparesis.  Dr. Havery Moros to do EGD as of 10/2017 GI eval.  . Benign brain tumor ([place])    Cystic lesion in cerebral aqueduct region with mild hydrocephalus-- stable MRI 02/2016.  Surveillance MRI 05/2017 --dilated cerebral aqueduct related to aqueductal stenosis and subsequent mild hydrocephalus (due to the 11 mm stable cystic lesion in cerebral  aqueduct---?congenitial?.  . Dysmenorrhea    vicodin occ during first 2 days of cycle.  . Gluten intolerance    pt reports she underwent full GI w/u to r/o celiac dz  . Hepatic steatosis    ultrasound 08/2017  . Hyperlipidemia, mixed   . Hypertensive retinopathy of both eyes   . Proliferative diabetic retinopathy of both eyes ([place])    steroid injections 10/2017--improved  . Sensorineural hearing loss of left ear    Sudden left hearing loss summer 2016--no improvement with steroids 01/2015 so brain MRI done by Dr. Redmond Baseman and it showed brain tumor that was determined to be benign.  Pt's hearing not bad enough for hearing aid as of 06/2016.  . Type 2 diabetes with complication ([place])    diab retpthy, diabetic gastroparesis (gastric emptying study mildly abnl 03/2017)  . White coat hypertension    Past Surgical History:  Procedure Laterality Date  . CHOLECYSTECTOMY  2000  . GASTRIC EMPTYING SCAN  04/20/2017   Mildly abnormal, particularly the 1st hour of emptying.  Marland Kitchen PARS PLANA VITRECTOMY Right 04/12/2018   Procedure: Right PARS PLANA VITRECTOMY WITH 25 GAUGE with intravitreal antibiotics;  Surgeon: Bernarda Caffey, MD;  Location: [facility];  Service: Ophthalmology;  Laterality: Right;    FAMILY HISTORY Family History  Problem Relation Age of Onset  . Brain cancer Mother   . Diabetes Father   . Diabetes Maternal Grandmother   . Cataracts Maternal Grandmother   . Cervical cancer Paternal Grandmother   . Colon cancer Maternal Grandfather 61  . Amblyopia Neg Hx   . Blindness Neg Hx   . Glaucoma Neg Hx   . Macular degeneration Neg Hx   . Retinal detachment Neg Hx   . Strabismus Neg Hx   . Retinitis pigmentosa Neg Hx     SOCIAL HISTORY Social History   Tobacco Use  . Smoking status: Never Smoker  . Smokeless tobacco: Never Used  Substance Use Topics  . Alcohol use: No  . Drug use: No         OPHTHALMIC EXAM:  Base Eye Exam    Visual Acuity (Snellen - Linear)      Right Left    Dist cc 20/30 +2 20/40 +1   Correction:  Glasses       Tonometry (Tonopen, 1:28 PM)      Right Left   Pressure 13 15       Pupils      Pupils Dark Light Shape React APD   Right PERRL 4 2 Round 2+ -   Left PERRL 4 2 Round 2+ -       Visual Fields (Counting fingers)      Left Right     Full   Restrictions Partial outer superior temporal, superior nasal deficiencies        Extraocular Movement      Right Left    Full Full       Neuro/Psych    Oriented x3:  Yes   Mood/Affect:  Normal       Dilation    Both eyes:  1.0% Mydriacyl, 2.5% Phenylephrine @ 1:29 PM        Slit Lamp and Fundus Exam    Slit Lamp Exam      Right Left   Lids/Lashes Dermatochalasis - upper lid, Meibomian gland dysfunction, Telangiectasia Dermatochalasis - upper lid, Meibomian gland dysfunction, Telangiectasia   Conjunctiva/Sclera White and quiet White and quiet   Cornea Clear, mild endopigment inferior, 1+ Punctate epithelial erosions 1+ Punctate epithelial erosions   Anterior Chamber Clear, no Cell/pigment, no hypopyon  Deep and quiet   Iris Round and dilated, No NVI Round and dilated, No NVI   Lens 2+ Nuclear sclerosis, 2+ Cortical cataract, 1+Posterior subcapsular cataract 2+ Nuclear sclerosis, 2+ Cortical cataract   Vitreous post vitrectomy; improving cell, scattered vitreous debris - clearing and settling inferiorly, vitreous condensations - improving Vitreous syneresis, new central/diffuse VH with fresh heme       Fundus Exam      Right Left   Disc Pink and Sharp, mild fibrosis superiorly with fine NVD and traction extending along ST arcade hazy view; Pink and sharp   C/D Ratio 0.1 0.1   Macula Blunted foveal reflex, cluster of exudates temporal macula, scattered MA, Mottling Hazy view; grossly attached; no details visible   Vessels mild Vascular attenuation, tractional fibrosis along ST arcades Vascular attenuation, mild Tortuousity   Periphery Attached, scattered PRP - room for inferior  fill-in Attached, inferior retina obscured by VH; scattered DBH, good 360 PRP laser changes          IMAGING AND PROCEDURES  Imaging and Procedures for @TODAY @  Intravitreal Injection, Pharmacologic Agent - OS - Left Eye       Time Out 06/23/2018. 2:29 PM. Confirmed correct patient, procedure, site, and patient consented.   Anesthesia Topical anesthesia was used. Anesthetic medications included Lidocaine 2%, Proparacaine 0.5%.   Procedure Preparation included 5% betadine to ocular surface, eyelid speculum. A supplied needle was used.   Injection:  1.25 mg Bevacizumab (AVASTIN) SOLN   NDC: 70360-001-02, Lot: [id number], Expiration date: 07/31/2018   Route: Intravitreal, Site: Left Eye, Waste: 0 mg  Post-op Post injection exam found visual acuity of at least counting fingers. The patient tolerated the procedure well. There were no complications. The patient received written and verbal post procedure care education.                ASSESSMENT/PLAN:    ICD-10-CM   1. Vitreous hemorrhage of left eye (HCC) H43.12   2. Proliferative diabetic retinopathy of both eyes with macular edema associated with type 2 diabetes mellitus (HCC) O29.4765 Intravitreal Injection, Pharmacologic Agent - OS - Left Eye    Bevacizumab (AVASTIN) SOLN 1.25 mg  3. Right endophthalmia H44.001   4. Essential hypertension I10   5. Hypertensive retinopathy of both eyes H35.033   6. Combined forms of age-related cataract of both eyes H25.813     1. Vitreous hemorrhage OS - new onset 4.24.20 - mild, diffuse VH - BCVA 20/40 +1 OS from 20/20 - secondary to PDR as described below (no RT/RD on exam) - s/p PRP OS 9.20.19 - s/p multiple IVA OS for DME (see below) - discussed findings and treatment options - recommend IVA #7 OS - RBA of procedure discussed, questions answered - informed consent obtained and signed - see procedure note - f/u Monday, May 4, 915 am  2.  Proliferative diabetic retinopathy w/  DME, OU - s/p IVA OD #  1 9.20.19, #2 (10.25.19), #3 (11.15.19), #4 (12.17.19), #5 (01.14.20), #6 (2.11.20) - s/p IVA OS #1 9.27.19, #2 (10.25.19), #3 (11.15.19), #4 (12.17.19), #5 (01.14.20), #6 (2.11.20) - S/P PRP OS (09.20.19) - S/P PRP OD (9.27.19 and 11.21.19)  - OS doing well -- VA stable at 20/25 - HbA1c 7.7% (10.2.19) from 10.3% (6.26.19) - FA (9.20.19) shows +NVE OU and leaking MA and capillary nonperfusion - repeat FA 11.15.19 shows NV regressing OU - s/p PRP fill-in OD, today 04.14.20 - exam shows early tractional fibrosis along ST arcades on fine NVD superior disc - pt reports significantly high BGs while on po prednisone -- frequently over 300 -- but now off po pred - monitor  3. Endophthalmitis OD - s/p IVA OU 04/09/2018 - s/p 25g PPV w/ intravitreal vanc, ceftaz and cefepime OD, 2.14.2020 - s/p intravitreal tap / vanc and ceflaz injections (02.16.20)             - doing well, BCVA stable 20/30             - inflammation/posterior debris improving further   - corneal edema improved             - IOP 13             - gram stain (2.14.20) shows G+ cocci, WBCs mostly PMNs;   - repeat gram stain from t/i (2.16.20) -- no organisms, just WBCs             - cultures from vitreous grew rare Staph warneri; cultures from t/i -- no growth             - continue Durezol QID OD  - cont Brimonidine Qdaily OD  - PSO PRN  - completed po pred taper -- caused significant elevations in BG - monitor  4. History of Vitreous Hemorrhage OD -- cleared from PPV for endophthalmitis - secondary to PDR as described below - S/P IVA OD #1 (09.20.19), #2 (10.25.19), #3 (11.15.19), #4 (12.16.19), #5 (03/12/2018) - S/P PRP OD #1 (09.27.19), fill in OD (11.21.19) -- each somewhat limited inferiorly by residual VH  5,6. Hypertensive retinopathy OU - discussed importance of tight BP control. - monitor for now  7. Combined form age related cataract OU-  - The symptoms of cataract, surgical options,  and treatments and risks were discussed with patient. - discussed diagnosis and progression - not yet visually significant - monitor for now   Ophthalmic Meds Ordered this visit:  Meds ordered this encounter  Medications  . Bevacizumab (AVASTIN) SOLN 1.25 mg       Return in about 8 days (around 07/01/2018) for 915 am - Dilated Exam, OCT.  There are no Patient Instructions on file for this visit.   Explained the diagnoses, plan, and follow up with the patient and they expressed understanding.  Patient expressed understanding of the importance of proper follow up care.    Gardiner Sleeper, M.D., Ph.D. Diseases & Surgery of the Retina and Vitreous [facility]   Abbreviations: M myopia (nearsighted); A astigmatism; H hyperopia (farsighted); P presbyopia; Mrx spectacle prescription;  CTL contact lenses; OD right eye; OS left eye; OU both eyes  XT exotropia; ET esotropia; PEK punctate epithelial keratitis; PEE punctate epithelial erosions; DES dry eye syndrome; MGD meibomian gland dysfunction; ATs artificial tears; PFAT's preservative free artificial tears; [place] nuclear sclerotic cataract; PSC posterior subcapsular cataract; ERM epi-retinal membrane; PVD posterior vitreous detachment; RD retinal detachment; DM diabetes mellitus; DR diabetic  retinopathy; NPDR non-proliferative diabetic retinopathy; PDR proliferative diabetic retinopathy; CSME clinically significant macular edema; DME diabetic macular edema; dbh dot blot hemorrhages; CWS cotton wool spot; POAG primary open angle glaucoma; C/D cup-to-disc ratio; HVF humphrey visual field; GVF goldmann visual field; OCT optical coherence tomography; IOP intraocular pressure; BRVO Branch retinal vein occlusion; CRVO central retinal vein occlusion; CRAO central retinal artery occlusion; BRAO branch retinal artery occlusion; RT retinal tear; SB scleral buckle; PPV pars plana vitrectomy; VH Vitreous hemorrhage; PRP panretinal laser  photocoagulation; IVK intravitreal kenalog; VMT vitreomacular traction; MH Macular hole;  NVD neovascularization of the disc; NVE neovascularization elsewhere; AREDS age related eye disease study; ARMD age related macular degeneration; POAG primary open angle glaucoma; EBMD epithelial/anterior basement membrane dystrophy; ACIOL anterior chamber intraocular lens; IOL intraocular lens; PCIOL posterior chamber intraocular lens; Phaco/IOL phacoemulsification with intraocular lens placement; [place] photorefractive keratectomy; LASIK laser assisted in situ keratomileusis; HTN hypertension; DM diabetes mellitus; COPD chronic obstructive pulmonary disease

## 2018-06-30 NOTE — Progress Notes (Signed)
[facility] Note  07/01/2018     CHIEF COMPLAINT Patient presents for Retina Follow Up   HISTORY OF PRESENT ILLNESS: Gloria Gomez is a 48 y.o. female who presents to the clinic today for:   HPI    Retina Follow Up    Patient presents with  Diabetic Retinopathy.  I, the attending physician,  performed the HPI with the patient and updated documentation appropriately.          Comments    48 y/o female pt here for 1 wk f/u for VH OS.  New onset 04.24.20, secondary to PDR OS.  Hx of endophthalmitis OD.  VA OS slightly improved from 1 wk ago.  No change in [place] OD.  Denies pain, flashes, floaters.  Durezol QID OD, Brimonidine QD OD.       Last edited by Bernarda Caffey, MD on 07/01/2018 10:18 AM. (History)    pt states she feels like her vision is getting better and the blood is clearing, pt states her blood sugar has been good this week  Referring physician: Tammi Sou, MD [address] [place], [place] [address]  HISTORICAL INFORMATION:   Selected notes from the MEDICAL RECORD NUMBER Referred for DM exam LEE:  Ocular Hx-NPDR PMH-DM (takes invokana and metformin), high cholesterol, benign brain tumor, white coat HTN    CURRENT MEDICATIONS: Current Outpatient Medications (Ophthalmic Drugs)  Medication Sig  . bacitracin-polymyxin b (POLYSPORIN) ophthalmic ointment Place 1 application into the right eye at bedtime. (Patient not taking: Reported on 06/11/2018)  . brimonidine (ALPHAGAN) 0.15 % ophthalmic solution Place 1 drop into the right eye 2 (two) times daily.  . Difluprednate 0.05 % EMUL Apply 1 drop to eye every hour while awake.  . prednisoLONE acetate (PRED FORTE) 1 % ophthalmic suspension Place 1 drop into the right eye every hour while awake. (Patient not taking: Reported on 06/11/2018)   No current facility-administered medications for this visit.  (Ophthalmic Drugs)   Current Outpatient Medications (Other)  Medication Sig  .  glipiZIDE (GLUCOTROL XL) 10 MG 24 hr tablet TAKE 1 TABLET BY MOUTH WITH BREAKFAST  . HYDROcodone-acetaminophen (NORCO/VICODIN) 5-325 MG tablet Take 1-2 tablets by mouth every 6 (six) hours as needed for moderate pain.  . INVOKANA 300 MG TABS tablet TAKE 1 TABLET BY MOUTH ONCE DAILY BEFORE  BREAKFAST  . losartan (COZAAR) 100 MG tablet Take 1 tablet (100 mg total) by mouth daily.  . metFORMIN (GLUCOPHAGE-XR) 500 MG 24 hr tablet Take 2 tablets by mouth twice daily  . pantoprazole (PROTONIX) 40 MG tablet Take 1 tablet by mouth once daily  . predniSONE (DELTASONE) 10 MG tablet Take 1 tablet (10 mg total) by mouth daily.  . promethazine (PHENERGAN) 12.5 MG tablet 1-2 tabs po q6h prn nausea  . temazepam (RESTORIL) 15 MG capsule Take 1 capsule (15 mg total) by mouth at bedtime as needed for sleep.   No current facility-administered medications for this visit.  (Other)      REVIEW OF SYSTEMS: ROS    Positive for: Endocrine, Eyes   Negative for: Constitutional, Gastrointestinal, Neurological, Skin, Genitourinary, Musculoskeletal, HENT, Cardiovascular, Respiratory, Psychiatric, Allergic/Imm, Heme/Lymph   Last edited by Matthew Folks, COA on 07/01/2018  9:49 AM. (History)       ALLERGIES Allergies  Allergen Reactions  . Gluten Meal Swelling  . Lisinopril Cough  . Pioglitazone Other (See Comments)    ELEVATED glucoses + worse chronic nausea  PAST MEDICAL HISTORY Past Medical History:  Diagnosis Date  . Abdominal bloating    likely from diab gastroparesis.  Dr. Havery Moros to do EGD as of 10/2017 GI eval.  . Benign brain tumor ([place])    Cystic lesion in cerebral aqueduct region with mild hydrocephalus-- stable MRI 02/2016.  Surveillance MRI 05/2017 --dilated cerebral aqueduct related to aqueductal stenosis and subsequent mild hydrocephalus (due to the 11 mm stable cystic lesion in cerebral aqueduct---?congenitial?.  . Cataract    OU  . Dysmenorrhea    vicodin occ during first 2 days of  cycle.  . Gluten intolerance    pt reports she underwent full GI w/u to r/o celiac dz  . Hepatic steatosis    ultrasound 08/2017  . Hyperlipidemia, mixed   . Hypertensive retinopathy    OU  . Hypertensive retinopathy of both eyes   . Proliferative diabetic retinopathy of both eyes ([place])    steroid injections 10/2017--improved  . Sensorineural hearing loss of left ear    Sudden left hearing loss summer 2016--no improvement with steroids 01/2015 so brain MRI done by Dr. Redmond Baseman and it showed brain tumor that was determined to be benign.  Pt's hearing not bad enough for hearing aid as of 06/2016.  . Type 2 diabetes with complication ([place])    diab retpthy, diabetic gastroparesis (gastric emptying study mildly abnl 03/2017)  . White coat hypertension    Past Surgical History:  Procedure Laterality Date  . CHOLECYSTECTOMY  2000  . EYE SURGERY    . GASTRIC EMPTYING SCAN  04/20/2017   Mildly abnormal, particularly the 1st hour of emptying.  Marland Kitchen PARS PLANA VITRECTOMY Right 04/12/2018   Procedure: Right PARS PLANA VITRECTOMY WITH 25 GAUGE with intravitreal antibiotics;  Surgeon: Bernarda Caffey, MD;  Location: [facility];  Service: Ophthalmology;  Laterality: Right;    FAMILY HISTORY Family History  Problem Relation Age of Onset  . Brain cancer Mother   . Diabetes Father   . Diabetes Maternal Grandmother   . Cataracts Maternal Grandmother   . Cervical cancer Paternal Grandmother   . Colon cancer Maternal Grandfather 2  . Amblyopia Neg Hx   . Blindness Neg Hx   . Glaucoma Neg Hx   . Macular degeneration Neg Hx   . Retinal detachment Neg Hx   . Strabismus Neg Hx   . Retinitis pigmentosa Neg Hx     SOCIAL HISTORY Social History   Tobacco Use  . Smoking status: Never Smoker  . Smokeless tobacco: Never Used  Substance Use Topics  . Alcohol use: No  . Drug use: No         OPHTHALMIC EXAM:  Base Eye Exam    Visual Acuity (Snellen - Linear)      Right Left   Dist cc 20/25 20/30 +2    Dist ph cc  NI   Correction:  Glasses       Tonometry (Tonopen, 9:51 AM)      Right Left   Pressure 22 22       Pupils      Dark Light Shape React APD   Right 4 3 Round Brisk None   Left 4 3 Round Brisk None       Visual Fields (Counting fingers)      Left Right    Full Full       Extraocular Movement      Right Left    Full, Ortho Full, Ortho       Neuro/Psych  Oriented x3:  Yes   Mood/Affect:  Normal       Dilation    Both eyes:  1.0% Mydriacyl, 2.5% Phenylephrine @ 9:51 AM        Slit Lamp and Fundus Exam    Slit Lamp Exam      Right Left   Lids/Lashes Dermatochalasis - upper lid, Meibomian gland dysfunction, Telangiectasia Dermatochalasis - upper lid, Meibomian gland dysfunction, Telangiectasia   Conjunctiva/Sclera White and quiet White and quiet   Cornea Clear, mild endopigment inferior, 1+ Punctate epithelial erosions 1+ Punctate epithelial erosions   Anterior Chamber Clear, no Cell/pigment, no hypopyon  Deep and quiet   Iris Round and dilated, No NVI Round and dilated, No NVI   Lens 2+ Nuclear sclerosis, 2+ Cortical cataract, 1+Posterior subcapsular cataract 2+ Nuclear sclerosis, 2+ Cortical cataract   Vitreous post vitrectomy; improving cell, scattered vitreous debris - clearing and settling inferiorly, vitreous condensations - improving Vitreous syneresis, VH clearly and settling inferiorly       Fundus Exam      Right Left   Disc Pink and Sharp, mild fibrosis superiorly with fine NVD and traction extending along ST arcade hazy view; Pink and sharp   C/D Ratio 0.1 0.1   Macula Blunted foveal reflex, cluster of exudates temporal macula, scattered MA, [place], +MA/exudates   Vessels mild Vascular attenuation, tractional fibrosis along ST arcades Vascular attenuation, mild Tortuousity   Periphery Attached, scattered PRP - room for inferior fill-in Attached, inferior retina obscured by VH; scattered DBH, good 360 PRP laser changes           IMAGING AND PROCEDURES  Imaging and Procedures for @TODAY @  OCT, Retina - OU - Both Eyes       Right Eye Quality was good. Central Foveal Thickness: 337. Progression has been stable. Findings include abnormal foveal contour, epiretinal membrane, intraretinal fluid, no SRF, intraretinal hyper-reflective material, vitreous traction, preretinal fibrosis (persistent IRF/IRHM, persistent vitreous traction along ST arcades).   Left Eye Quality was good. Central Foveal Thickness: 300. Progression has worsened. Findings include intraretinal fluid, no SRF, normal foveal contour, intraretinal hyper-reflective material, epiretinal membrane (Persistent cystic changes, new onset vitreous opacities).   Notes *Images captured and stored on drive  Diagnosis / Impression:  OD: persistent DME -- persistent IRF/IRHM; interval improvement in vitreous opacities OS: abnormal foveal contour; +IRF; preretinal fibrosis about the disc; Persistent cystic changes, new onset vitreous opacities  Clinical management:  See below  Abbreviations: NFP - Normal foveal profile. CME - cystoid macular edema. PED - pigment epithelial detachment. IRF - intraretinal fluid. SRF - subretinal fluid. EZ - ellipsoid zone. ERM - epiretinal membrane. ORA - outer retinal atrophy. ORT - outer retinal tubulation. SRHM - subretinal hyper-reflective material                 ASSESSMENT/PLAN:    ICD-10-CM   1. Vitreous hemorrhage of left eye (HCC) H43.12   2. Proliferative diabetic retinopathy of both eyes with macular edema associated with type 2 diabetes mellitus ([place]) T46.5681   3. Right endophthalmia H44.001   4. Vitreous hemorrhage, right eye ([place]) H43.11   5. Essential hypertension I10   6. Hypertensive retinopathy of both eyes H35.033   7. Combined forms of age-related cataract of both eyes H25.813   8. Vitreous hemorrhage of right eye ([place]) H43.11   9. Retinal edema H35.81 OCT, Retina - OU - Both Eyes    1.  Vitreous hemorrhage OS - new onset 4.24.20 - s/p IVA  OS #7 on 4.26.20 - today, mild improvement in diffuse VH - BCVA 20/30 +2 OS from 20/40+1 - secondary to PDR as described below (no RT/RD on exam) - s/p PRP OS 9.20.19 -- likely needs fill in - s/p multiple IVA OS for DME (see below) - f/u 1-2 weeks for DFE/OCT, possible laser  2.  Proliferative diabetic retinopathy w/ DME, OU - s/p IVA OD #1 9.20.19, #2 (10.25.19), #3 (11.15.19), #4 (12.17.19), #5 (01.14.20), #6 (2.11.20) - s/p IVA OS #1 9.27.19, #2 (10.25.19), #3 (11.15.19), #4 (12.17.19), #5 (01.14.20), #6 (2.11.20), #7 (04.26.20) - S/P PRP OS (09.20.19) - S/P PRP OD (9.27.19 and 11.21.19), fill-in (04.14.20) - OD doing well -- VA stable at 20/25, but there is some preretinal fibrosis / tractional membranes - HbA1c 7.7% (10.2.19) from 10.3% (6.26.19) - FA (9.20.19) shows +NVE OU and leaking MA and capillary nonperfusion - repeat FA 11.15.19 shows NV regressing OU - exam shows early tractional fibrosis along ST arcades on fine NVD superior disc - pt reports significantly high BGs while on po prednisone -- frequently over 300 -- but now off po pred - monitor  3. Endophthalmitis OD - s/p IVA OU 04/09/2018 - s/p 25g PPV w/ intravitreal vanc, ceftaz and cefepime OD, 2.14.2020 - s/p intravitreal tap / vanc and ceflaz injections (02.16.20)             - doing well, BCVA 20/25             - inflammation/posterior debris improving further   - corneal edema improved             - IOP 13             - gram stain (2.14.20) shows G+ cocci, WBCs mostly PMNs;   - repeat gram stain from t/i (2.16.20) -- no organisms, just WBCs             - cultures from vitreous grew rare Staph warneri; cultures from t/i -- no growth             - continue Durezol QID OD  - cont Brimonidine Qdaily OD  - PSO PRN  - completed po pred taper -- caused significant elevations in BG  - monitor  4. History of Vitreous Hemorrhage OD -- cleared from PPV for  endophthalmitis - secondary to PDR as described below - S/P IVA OD #1 (09.20.19), #2 (10.25.19), #3 (11.15.19), #4 (12.16.19), #5 (03/12/2018) - S/P PRP OD #1 (09.27.19), fill in OD (11.21.19) -- each somewhat limited inferiorly by residual VH  5,6. Hypertensive retinopathy OU - discussed importance of tight BP control. - monitor for now  7. Combined form age related cataract OU-  - The symptoms of cataract, surgical options, and treatments and risks were discussed with patient. - discussed diagnosis and progression - not yet visually significant - monitor for now   Ophthalmic Meds Ordered this visit:  No orders of the defined types were placed in this encounter.      Return for f/u 1-2 weeks, VH OS, DFE, OCT, possible Laser.  There are no Patient Instructions on file for this visit.   Explained the diagnoses, plan, and follow up with the patient and they expressed understanding.  Patient expressed understanding of the importance of proper follow up care.   This document serves as a record of services personally performed by Gardiner Sleeper, MD, PhD. It was created on their behalf by Ernest Mallick, OA, an ophthalmic assistant. The creation of this  record is the provider's dictation and/or activities during the visit.    Electronically signed by: Ernest Mallick, OA  05.03.2020 12:44 PM     Gardiner Sleeper, M.D., Ph.D. Diseases & Surgery of the Retina and Vitreous [facility]  I have reviewed the above documentation for accuracy and completeness, and I agree with the above. Gardiner Sleeper, M.D., Ph.D. 07/01/18 12:45 PM   Abbreviations: M myopia (nearsighted); A astigmatism; H hyperopia (farsighted); P presbyopia; Mrx spectacle prescription;  CTL contact lenses; OD right eye; OS left eye; OU both eyes  XT exotropia; ET esotropia; PEK punctate epithelial keratitis; PEE punctate epithelial erosions; DES dry eye syndrome; MGD meibomian gland dysfunction; ATs  artificial tears; PFAT's preservative free artificial tears; [place] nuclear sclerotic cataract; PSC posterior subcapsular cataract; ERM epi-retinal membrane; PVD posterior vitreous detachment; RD retinal detachment; DM diabetes mellitus; DR diabetic retinopathy; NPDR non-proliferative diabetic retinopathy; PDR proliferative diabetic retinopathy; CSME clinically significant macular edema; DME diabetic macular edema; dbh dot blot hemorrhages; CWS cotton wool spot; POAG primary open angle glaucoma; C/D cup-to-disc ratio; HVF humphrey visual field; GVF goldmann visual field; OCT optical coherence tomography; IOP intraocular pressure; BRVO Branch retinal vein occlusion; CRVO central retinal vein occlusion; CRAO central retinal artery occlusion; BRAO branch retinal artery occlusion; RT retinal tear; SB scleral buckle; PPV pars plana vitrectomy; VH Vitreous hemorrhage; PRP panretinal laser photocoagulation; IVK intravitreal kenalog; VMT vitreomacular traction; MH Macular hole;  NVD neovascularization of the disc; NVE neovascularization elsewhere; AREDS age related eye disease study; ARMD age related macular degeneration; POAG primary open angle glaucoma; EBMD epithelial/anterior basement membrane dystrophy; ACIOL anterior chamber intraocular lens; IOL intraocular lens; PCIOL posterior chamber intraocular lens; Phaco/IOL phacoemulsification with intraocular lens placement; [place] photorefractive keratectomy; LASIK laser assisted in situ keratomileusis; HTN hypertension; DM diabetes mellitus; COPD chronic obstructive pulmonary disease

## 2018-07-01 ENCOUNTER — Other Ambulatory Visit: Payer: Self-pay

## 2018-07-01 ENCOUNTER — Encounter (INDEPENDENT_AMBULATORY_CARE_PROVIDER_SITE_OTHER): Payer: Self-pay | Admitting: Ophthalmology

## 2018-07-01 ENCOUNTER — Ambulatory Visit (INDEPENDENT_AMBULATORY_CARE_PROVIDER_SITE_OTHER): Payer: BLUE CROSS/BLUE SHIELD | Admitting: Ophthalmology

## 2018-07-01 DIAGNOSIS — H3581 Retinal edema: Secondary | ICD-10-CM

## 2018-07-01 DIAGNOSIS — H4311 Vitreous hemorrhage, right eye: Secondary | ICD-10-CM | POA: Diagnosis not present

## 2018-07-01 DIAGNOSIS — H4312 Vitreous hemorrhage, left eye: Secondary | ICD-10-CM

## 2018-07-01 DIAGNOSIS — H44001 Unspecified purulent endophthalmitis, right eye: Secondary | ICD-10-CM

## 2018-07-01 DIAGNOSIS — H35033 Hypertensive retinopathy, bilateral: Secondary | ICD-10-CM

## 2018-07-01 DIAGNOSIS — E113513 Type 2 diabetes mellitus with proliferative diabetic retinopathy with macular edema, bilateral: Secondary | ICD-10-CM

## 2018-07-01 DIAGNOSIS — I10 Essential (primary) hypertension: Secondary | ICD-10-CM

## 2018-07-01 DIAGNOSIS — H25813 Combined forms of age-related cataract, bilateral: Secondary | ICD-10-CM

## 2018-07-04 ENCOUNTER — Telehealth: Payer: Self-pay | Admitting: Family Medicine

## 2018-07-05 NOTE — Telephone Encounter (Signed)
Confirmed with pharmacy for 8 day supply until pt's f/u appt on 5/13

## 2018-07-05 NOTE — Telephone Encounter (Signed)
Pharmacy called and stated that only 8 days of medication was sent to pharmacy and wanted to confirm. Please advise

## 2018-07-10 ENCOUNTER — Other Ambulatory Visit: Payer: Self-pay

## 2018-07-10 ENCOUNTER — Ambulatory Visit (INDEPENDENT_AMBULATORY_CARE_PROVIDER_SITE_OTHER): Payer: BLUE CROSS/BLUE SHIELD | Admitting: Family Medicine

## 2018-07-10 ENCOUNTER — Encounter: Payer: Self-pay | Admitting: Family Medicine

## 2018-07-10 ENCOUNTER — Ambulatory Visit: Payer: BLUE CROSS/BLUE SHIELD | Admitting: Family Medicine

## 2018-07-10 VITALS — BP 138/84 | HR 90 | Temp 98.4°F | Resp 16

## 2018-07-10 DIAGNOSIS — E7800 Pure hypercholesterolemia, unspecified: Secondary | ICD-10-CM | POA: Diagnosis not present

## 2018-07-10 DIAGNOSIS — I10 Essential (primary) hypertension: Secondary | ICD-10-CM

## 2018-07-10 DIAGNOSIS — E11319 Type 2 diabetes mellitus with unspecified diabetic retinopathy without macular edema: Secondary | ICD-10-CM | POA: Diagnosis not present

## 2018-07-10 MED ORDER — METOPROLOL SUCCINATE ER 50 MG PO TB24: 50.0000 mg | ORAL_TABLET | Freq: Every day | ORAL | 2 refills | Status: DC

## 2018-07-10 MED ORDER — METFORMIN HCL ER 500 MG PO TB24: 1000.0000 mg | ORAL_TABLET | Freq: Two times a day (BID) | ORAL | 1 refills | Status: DC

## 2018-07-10 NOTE — Progress Notes (Signed)
Virtual Visit via Video Note  I connected with Gloria Gomez on 07/10/18 at 11:00 AM EDT by a video enabled telemedicine application and verified that I am speaking with the correct person using two identifiers.  Location patient: home Location provider:work or home office Persons participating in the virtual visit: patient, provider  I discussed the limitations of evaluation and management by telemedicine and the availability of in person appointments. The patient expressed understanding and agreed to proceed.  Telemedicine visit is a necessity given the COVID-19 restrictions in place at the current time.  HPI: 48 y/o WF here for 3 mo f/u DM, HTN, and HLD. She has historically had poorly controlled DM and HTN and has been very cautious regarding medication use for her problems.  Some of this is due to financial constraints.  DM: last o/v her A1c was increased to 8.4% from 7.7% She chose to try to improve diet and exercise habits rather than increase meds (my plan was to stop glipizide xl and add victoza. Would continue full dose invokana and metformin. Had a recent eye infection, had to get surgery, is gradually improving lately. Will be getting more laser surgery next week. She had to be on prednisone per eye MD (40 mg daily x 21d, then 20mg  x 2 wks, then 10mg  x 1 wk: during that time glucoses were significantly elevated into 220-280 range, some higher than 300, but lately she says fasting glucs around 130 avg.  HTN: hx of bilat hypertensive retinopathy. 132-138/80-84.  She has upper arm cuff now. HR around 90 avg.  HLD: she has been intolerant of atorvastatin and has deferred any trial of new statin in the past. No exercise.  In fact, she still has to spend up to 1 week at a time on bedrest due to retinal problems on/off. She takes vicodin in small quantities for dysmenorrhea.  ROS: no CP, no SOB, no wheezing, no cough, no dizziness, no HAs, no rashes, no melena/hematochezia.  No polyuria or  polydipsia.  No myalgias or arthralgias.   Past Medical History:  Diagnosis Date  . Abdominal bloating    likely from diab gastroparesis.  Dr. Havery Moros to do EGD as of 10/2017 GI eval.  . Benign brain tumor ([place])    Cystic lesion in cerebral aqueduct region with mild hydrocephalus-- stable MRI 02/2016.  Surveillance MRI 05/2017 --dilated cerebral aqueduct related to aqueductal stenosis and subsequent mild hydrocephalus (due to the 11 mm stable cystic lesion in cerebral aqueduct---?congenitial?.  . Cataract    OU  . Dysmenorrhea    vicodin occ during first 2 days of cycle.  . Gluten intolerance    Gloria Gomez reports she underwent full GI w/u to r/o celiac dz  . Hepatic steatosis    ultrasound 08/2017  . Hyperlipidemia, mixed   . Hypertensive retinopathy    OU  . Hypertensive retinopathy of both eyes   . Proliferative diabetic retinopathy of both eyes ([place])    steroid injections 10/2017--improved  . Sensorineural hearing loss of left ear    Sudden left hearing loss summer 2016--no improvement with steroids 01/2015 so brain MRI done by Dr. Redmond Baseman and it showed brain tumor that was determined to be benign.  Gloria Gomez's hearing not bad enough for hearing aid as of 06/2016.  . Type 2 diabetes with complication ([place])    diab retpthy, diabetic gastroparesis (gastric emptying study mildly abnl 03/2017)  . White coat hypertension     Past Surgical History:  Procedure Laterality Date  . CHOLECYSTECTOMY  2000  . EYE SURGERY    . GASTRIC EMPTYING SCAN  04/20/2017   Mildly abnormal, particularly the 1st hour of emptying.  Marland Kitchen PARS PLANA VITRECTOMY Right 04/12/2018   Procedure: Right PARS PLANA VITRECTOMY WITH 25 GAUGE with intravitreal antibiotics;  Surgeon: Bernarda Caffey, MD;  Location: [facility];  Service: Ophthalmology;  Laterality: Right;    Family History  Problem Relation Age of Onset  . Brain cancer Mother   . Diabetes Father   . Diabetes Maternal Grandmother   . Cataracts Maternal Grandmother   .  Cervical cancer Paternal Grandmother   . Colon cancer Maternal Grandfather 72  . Amblyopia Neg Hx   . Blindness Neg Hx   . Glaucoma Neg Hx   . Macular degeneration Neg Hx   . Retinal detachment Neg Hx   . Strabismus Neg Hx   . Retinitis pigmentosa Neg Hx      Current Outpatient Medications:  .  brimonidine (ALPHAGAN) 0.15 % ophthalmic solution, Place 1 drop into the right eye 2 (two) times daily. (Patient taking differently: Place 1 drop into the right eye 2 (two) times daily. Gloria Gomez applies 1 drop in right eye.), Disp: 10 mL, Rfl: 3 .  Difluprednate 0.05 % EMUL, Apply 1 drop to eye every hour while awake. (Patient taking differently: Apply 1 drop to eye every hour while awake. Gloria Gomez uses 4 times daily.), Disp: 15 mL, Rfl: 1 .  glipiZIDE (GLUCOTROL XL) 10 MG 24 hr tablet, TAKE 1 TABLET BY MOUTH WITH BREAKFAST, Disp: 90 tablet, Rfl: 0 .  HYDROcodone-acetaminophen (NORCO/VICODIN) 5-325 MG tablet, Take 1-2 tablets by mouth every 6 (six) hours as needed for moderate pain., Disp: 40 tablet, Rfl: 0 .  INVOKANA 300 MG TABS tablet, TAKE 1 TABLET BY MOUTH ONCE DAILY BEFORE  BREAKFAST, Disp: 90 tablet, Rfl: 0 .  losartan (COZAAR) 100 MG tablet, Take 1 tablet (100 mg total) by mouth daily., Disp: 90 tablet, Rfl: 1 .  metFORMIN (GLUCOPHAGE-XR) 500 MG 24 hr tablet, Take 2 tablets by mouth twice daily, Disp: 30 tablet, Rfl: 0 .  pantoprazole (PROTONIX) 40 MG tablet, Take 1 tablet by mouth once daily, Disp: 90 tablet, Rfl: 0 .  promethazine (PHENERGAN) 12.5 MG tablet, 1-2 tabs po q6h prn nausea, Disp: 30 tablet, Rfl: 1 .  temazepam (RESTORIL) 15 MG capsule, Take 1 capsule (15 mg total) by mouth at bedtime as needed for sleep., Disp: 30 capsule, Rfl: 1 .  prednisoLONE acetate (PRED FORTE) 1 % ophthalmic suspension, Place 1 drop into the right eye every hour while awake. (Patient not taking: Reported on 06/11/2018), Disp: 15 mL, Rfl: 0  EXAM:  VITALS per patient if applicable: BP 481/85 (BP Location: Left Arm,  Patient Position: Sitting, Cuff Size: Normal)   Pulse 90   Temp 98.4 F (36.9 C) (Oral)   Resp 16    GENERAL: alert, oriented, appears well and in no acute distress  HEENT: atraumatic, conjunttiva clear, no obvious abnormalities on inspection of external nose and ears  NECK: normal movements of the head and neck  LUNGS: on inspection no signs of respiratory distress, breathing rate appears normal, no obvious gross SOB, gasping or wheezing  CV: no obvious cyanosis  MS: moves all visible extremities without noticeable abnormality  PSYCH/NEURO: pleasant and cooperative, no obvious depression or anxiety, speech and thought processing grossly intact  LABS: none today   Chemistry      Component Value Date/Time   NA 137 04/12/2018 1222   K 4.0 04/12/2018  1222   CL 109 04/12/2018 1222   CO2 17 (L) 04/12/2018 1222   BUN 12 04/12/2018 1222   CREATININE 0.55 04/12/2018 1222      Component Value Date/Time   CALCIUM 9.2 04/12/2018 1222   ALKPHOS 67 04/03/2018 1339   AST 17 04/03/2018 1339   ALT 23 04/03/2018 1339   BILITOT 0.2 04/03/2018 1339     Lab Results  Component Value Date   CHOL 151 07/19/2016   HDL 34.50 (L) 07/19/2016   LDLDIRECT 77.0 07/19/2016   TRIG 276.0 (H) 07/19/2016   CHOLHDL 4 07/19/2016   Lab Results  Component Value Date   HGBA1C 8.4 (H) 04/12/2018   Lab Results  Component Value Date   TSH 1.65 08/22/2017    ASSESSMENT AND PLAN:  Discussed the following assessment and plan:  1) DM 2: poor control->even worse lately due to long period of prednisone use for tx of her eye issues. Since being off prednisone her gluc's have gone down nicely and we'll make no med changes at this time. Expect A1c to be up significantly, but will likely not base any dosing/med changes on this number this time. Lytes/cr-future.  2) Uncontrolled HTN: improved, but her goal is 120s/70s-->I reiterated the importance of good control given her eye issues.  She is agreeable  to adding toprol xl 50mg , 1 qd.  Continue losartan 100 mg qd. Lytes/cr- future.  3) HLD: intol of atorva in the past.  She continues to defer retry of statin at this time but will consider in the future at a point when her DM, HTN, and eye problems are more stable. FLP--future.   I discussed the assessment and treatment plan with the patient. The patient was provided an opportunity to ask questions and all were answered. The patient agreed with the plan and demonstrated an understanding of the instructions.   The patient was advised to call back or seek an in-person evaluation if the symptoms worsen or if the condition fails to improve as anticipated.  F/u: 42mo, earlier if needed (esp if bp not consistently in 120s/70s).  Signed:  Crissie Sickles, MD           07/10/2018

## 2018-07-15 NOTE — Progress Notes (Signed)
[facility] Note  07/16/2018     CHIEF COMPLAINT Patient presents for Retina Follow Up   HISTORY OF PRESENT ILLNESS: Gloria Gomez is a 48 y.o. female who presents to the clinic today for:   HPI    Retina Follow Up    Patient presents with  Other.  In left eye.  This started 2 weeks ago.  Severity is moderate.  Duration of 2 weeks.  Since onset it is stable.  I, the attending physician,  performed the HPI with the patient and updated documentation appropriately.          Comments    48 y/o female pt here for 2 wk f/u for VH OS.  No change in [place] OS, but feels VA OD may be a bit worse.  Feels VA OU is more blurred in the mornings, and after putting gtts in.  Denies pain, flashes, floaters.  Durezol QID OD.  Brimonidine QHS OU.       Last edited by Bernarda Caffey, MD on 07/16/2018 12:56 PM. (History)    pt states she feels like the vision in her left eye is stable and she states the floaters are gone since her last injections, she states the right eye is a little fuzzier than she would like and hasn't been the same since she fell, she denies eye pain in her right eye, she states her PCP started her on a new bp med, she states her bp was running around 136/84, she states the PCP wants it below 130 so he gave her 50 mg of metoprolol, she states she is getting headaches and nausea, so she thinks the dose is too high   Referring physician: Tammi Sou, MD [address] [place], [place] [address]  HISTORICAL INFORMATION:   Selected notes from the MEDICAL RECORD NUMBER Referred for DM exam LEE:  Ocular Hx-NPDR PMH-DM (takes invokana and metformin), high cholesterol, benign brain tumor, white coat HTN    CURRENT MEDICATIONS: Current Outpatient Medications (Ophthalmic Drugs)  Medication Sig  . brimonidine (ALPHAGAN) 0.15 % ophthalmic solution Place 1 drop into the right eye 2 (two) times daily. (Patient taking differently: Place 1 drop into the  right eye 2 (two) times daily. Pt applies 1 drop in right eye.)  . Difluprednate 0.05 % EMUL Apply 1 drop to eye every hour while awake. (Patient taking differently: Apply 1 drop to eye every hour while awake. Pt uses 4 times daily.)  . prednisoLONE acetate (PRED FORTE) 1 % ophthalmic suspension Place 1 drop into the right eye every hour while awake. (Patient not taking: Reported on 06/11/2018)   No current facility-administered medications for this visit.  (Ophthalmic Drugs)   Current Outpatient Medications (Other)  Medication Sig  . glipiZIDE (GLUCOTROL XL) 10 MG 24 hr tablet TAKE 1 TABLET BY MOUTH WITH BREAKFAST  . HYDROcodone-acetaminophen (NORCO/VICODIN) 5-325 MG tablet Take 1-2 tablets by mouth every 6 (six) hours as needed for moderate pain.  . INVOKANA 300 MG TABS tablet TAKE 1 TABLET BY MOUTH ONCE DAILY BEFORE  BREAKFAST  . losartan (COZAAR) 100 MG tablet Take 1 tablet (100 mg total) by mouth daily.  . metFORMIN (GLUCOPHAGE-XR) 500 MG 24 hr tablet Take 2 tablets (1,000 mg total) by mouth 2 (two) times daily.  . metoprolol succinate (TOPROL-XL) 50 MG 24 hr tablet Take 1 tablet (50 mg total) by mouth daily. Take with or immediately following a meal.  . pantoprazole (PROTONIX) 40  MG tablet Take 1 tablet by mouth once daily  . promethazine (PHENERGAN) 12.5 MG tablet 1-2 tabs po q6h prn nausea  . temazepam (RESTORIL) 15 MG capsule Take 1 capsule (15 mg total) by mouth at bedtime as needed for sleep.   No current facility-administered medications for this visit.  (Other)      REVIEW OF SYSTEMS: ROS    Positive for: Endocrine, Eyes   Negative for: Constitutional, Gastrointestinal, Neurological, Skin, Genitourinary, Musculoskeletal, HENT, Cardiovascular, Respiratory, Psychiatric, Allergic/Imm, Heme/Lymph   Last edited by Matthew Folks, COA on 07/16/2018 10:20 AM. (History)       ALLERGIES Allergies  Allergen Reactions  . Gluten Meal Swelling  . Lisinopril Cough  . Pioglitazone  Other (See Comments)    ELEVATED glucoses + worse chronic nausea    PAST MEDICAL HISTORY Past Medical History:  Diagnosis Date  . Abdominal bloating    likely from diab gastroparesis.  Dr. Havery Moros to do EGD as of 10/2017 GI eval.  . Benign brain tumor ([place])    Cystic lesion in cerebral aqueduct region with mild hydrocephalus-- stable MRI 02/2016.  Surveillance MRI 05/2017 --dilated cerebral aqueduct related to aqueductal stenosis and subsequent mild hydrocephalus (due to the 11 mm stable cystic lesion in cerebral aqueduct---?congenitial?.  . Cataract    OU  . Dysmenorrhea    vicodin occ during first 2 days of cycle.  . Gluten intolerance    pt reports she underwent full GI w/u to r/o celiac dz  . Hepatic steatosis    ultrasound 08/2017  . Hyperlipidemia, mixed   . Hypertensive retinopathy    OU  . Hypertensive retinopathy of both eyes   . Proliferative diabetic retinopathy of both eyes ([place])    steroid injections 10/2017--improved  . Sensorineural hearing loss of left ear    Sudden left hearing loss summer 2016--no improvement with steroids 01/2015 so brain MRI done by Dr. Redmond Baseman and it showed brain tumor that was determined to be benign.  Pt's hearing not bad enough for hearing aid as of 06/2016.  . Type 2 diabetes with complication ([place])    diab retpthy, diabetic gastroparesis (gastric emptying study mildly abnl 03/2017)  . White coat hypertension    Past Surgical History:  Procedure Laterality Date  . CHOLECYSTECTOMY  2000  . EYE SURGERY    . GASTRIC EMPTYING SCAN  04/20/2017   Mildly abnormal, particularly the 1st hour of emptying.  Marland Kitchen PARS PLANA VITRECTOMY Right 04/12/2018   Procedure: Right PARS PLANA VITRECTOMY WITH 25 GAUGE with intravitreal antibiotics;  Surgeon: Bernarda Caffey, MD;  Location: [facility];  Service: Ophthalmology;  Laterality: Right;    FAMILY HISTORY Family History  Problem Relation Age of Onset  . Brain cancer Mother   . Diabetes Father   . Diabetes  Maternal Grandmother   . Cataracts Maternal Grandmother   . Cervical cancer Paternal Grandmother   . Colon cancer Maternal Grandfather 71  . Amblyopia Neg Hx   . Blindness Neg Hx   . Glaucoma Neg Hx   . Macular degeneration Neg Hx   . Retinal detachment Neg Hx   . Strabismus Neg Hx   . Retinitis pigmentosa Neg Hx     SOCIAL HISTORY Social History   Tobacco Use  . Smoking status: Never Smoker  . Smokeless tobacco: Never Used  Substance Use Topics  . Alcohol use: No  . Drug use: No         OPHTHALMIC EXAM:  Base Eye Exam  Visual Acuity (Snellen - Linear)      Right Left   Dist cc 20/50 -2 20/40   Dist ph cc 20/30 -2 20/30   Correction:  Glasses       Tonometry (Tonopen, 10:21 AM)      Right Left   Pressure 18 21       Pupils      Dark Light Shape React APD   Right 4 3 Round Brisk None   Left 4 3 Round Brisk None       Visual Fields (Counting fingers)      Left Right    Full Full       Extraocular Movement      Right Left    Full, Ortho Full, Ortho       Neuro/Psych    Oriented x3:  Yes   Mood/Affect:  Normal       Dilation    Both eyes:  1.0% Mydriacyl, 2.5% Phenylephrine @ 10:21 AM        Slit Lamp and Fundus Exam    Slit Lamp Exam      Right Left   Lids/Lashes Dermatochalasis - upper lid, Meibomian gland dysfunction, Telangiectasia Dermatochalasis - upper lid, Meibomian gland dysfunction, Telangiectasia   Conjunctiva/Sclera White and quiet White and quiet   Cornea Clear, mild endopigment inferior, 1+ Punctate epithelial erosions 1+ Punctate epithelial erosions   Anterior Chamber Clear, no Cell/pigment, no hypopyon  Deep and quiet   Iris Round and dilated, No NVI Round and dilated, No NVI   Lens 2+ Nuclear sclerosis, 2+ Cortical cataract, 1+Posterior subcapsular cataract 2+ Nuclear sclerosis, 2+ Cortical cataract   Vitreous post vitrectomy; improving cell, scattered vitreous debris - clearing and settling inferiorly, vitreous condensations  - improving Vitreous syneresis, residual blood stained vitreous improved and settling inferiorly, red blot clots settled inferiorly       Fundus Exam      Right Left   Disc Pink and Sharp, mild fibrosis superiorly with fine NVD and traction extending superiorly and along ST arcade hazy view; Pink and sharp   C/D Ratio 0.1 0.1   Macula Flat, Blunted foveal reflex, cluster of MA and exudates temporal to fovea Flat, good foveal reflex, clusted of +MA/exudate temporal macula   Vessels mild Vascular attenuation, tractional fibrosis along ST arcades Vascular attenuation, mild Tortuousity   Periphery Attached, scattered DBH, scattered PRP  Attached, inferior retina obscured by VH; scattered DBH, good 360 PRP laser changes - room for fill-in          IMAGING AND PROCEDURES  Imaging and Procedures for @TODAY @  OCT, Retina - OU - Both Eyes       Right Eye Quality was good. Central Foveal Thickness: 332. Progression has been stable. Findings include abnormal foveal contour, epiretinal membrane, no SRF, intraretinal hyper-reflective material, vitreous traction, preretinal fibrosis (Mild interval decrease in IRF, persistent vitreous traction along ST arcades).   Left Eye Quality was good. Central Foveal Thickness: 295. Progression has improved. Findings include intraretinal fluid, no SRF, normal foveal contour, intraretinal hyper-reflective material, epiretinal membrane (Interval improvement in vitreous opacities, IRHM, IRF).   Notes *Images captured and stored on drive  Diagnosis / Impression:  OD: persistent DME -- persistent IRF/IRHM; Mild interval decrease in IRF, persistent vitreous traction along superior arcades  OS: abnormal foveal contour; +IRF; preretinal fibrosis about the disc; Interval improvement in vitreous opacities, IRHM, IRF  Clinical management:  See below  Abbreviations: NFP - Normal foveal profile. CME - cystoid  macular edema. PED - pigment epithelial detachment. IRF -  intraretinal fluid. SRF - subretinal fluid. EZ - ellipsoid zone. ERM - epiretinal membrane. ORA - outer retinal atrophy. ORT - outer retinal tubulation. SRHM - subretinal hyper-reflective material         Panretinal Photocoagulation - OS - Left Eye       LASER PROCEDURE NOTE  Diagnosis:   Proliferative Diabetic Retinopathy, LEFT EYE  Procedure:  Pan-retinal photocoagulation using slit lamp laser, fill-in, LEFT EYE  Anesthesia:  Topical  Surgeon: Bernarda Caffey, MD, PhD   Informed consent obtained, operative eye marked, and time out performed prior to initiation of laser.   Lumenis [id number] slit lamp laser Pattern: 3x3 square Power: 360 mW Duration: 30 msec  Spot size: 200 microns  # spots: 1155 spots -- 360 fill in, mostly posterior  Complications: None.  RTC: 1-2 wk  Patient tolerated the procedure well and received written and verbal post-procedure care information/education.                 ASSESSMENT/PLAN:    ICD-10-CM   1. Vitreous hemorrhage of left eye (HCC) H43.12 Panretinal Photocoagulation - OS - Left Eye  2. Proliferative diabetic retinopathy of both eyes with macular edema associated with type 2 diabetes mellitus ([place]) N05.3976 Panretinal Photocoagulation - OS - Left Eye  3. Right endophthalmia H44.001   4. Vitreous hemorrhage, right eye ([place]) H43.11   5. Essential hypertension I10   6. Hypertensive retinopathy of both eyes H35.033   7. Combined forms of age-related cataract of both eyes H25.813   8. Retinal edema H35.81 OCT, Retina - OU - Both Eyes  9. Vitreous hemorrhage of right eye (HCC) H43.11     1. Vitreous hemorrhage OS  - new onset 4.24.20  - s/p IVA OS #7 on 4.26.20  - today, VH improving -- clearing and settling inferiorly  - BCVA stable at 20/30  - etiology: secondary to PDR as described below (no RT/RD on exam)  - s/p multiple IVA OS for DME (see below)  - s/p PRP OS 9.20.19 -- room for posterior fill in  - recommend PRP OS  fill in today, 05.19.20  - pt wishes to proceed  - RBA of procedure discussed, questions answered  - informed consent obtained and signed  - see procedure note  - f/u 1-2 weeks for DFE/OCT, possible injection OU  2.  Proliferative diabetic retinopathy w/ DME, OU  - s/p IVA OD #1 9.20.19, #2 (10.25.19), #3 (11.15.19), #4 (12.17.19), #5 (01.14.20), #6 (2.11.20)  - s/p IVA OS #1 9.27.19, #2 (10.25.19), #3 (11.15.19), #4 (12.17.19), #5 (01.14.20), #6 (2.11.20), #7 (04.26.20)  - S/P PRP OS (09.20.19)  - S/P PRP OD (9.27.19 and 11.21.19), fill-in (04.14.20)  - OD doing well -- VA slightly decreased to 20/30 from 20/25, but there is some preretinal fibrosis / tractional membranes  - HbA1c 8.4% (02.14.20) from 7.7 (10.02.19)  - FA (9.20.19) shows +NVE OU and leaking MA and capillary nonperfusion  - repeat FA 11.15.19 shows NV regressing OU  - exam shows early tractional fibrosis along ST arcades on fine NVD superior disc  - pt reports significantly high BGs while on po prednisone -- frequently over 300 -- but now off po pred  - f/u in 1-2 wks for possible injection  3. Endophthalmitis OD  - s/p IVA OU 04/09/2018  - s/p 25g PPV w/ intravitreal vanc, ceftaz and cefepime OD, 2.14.2020  - s/p intravitreal tap / vanc and ceflaz injections (  02.16.20)             - doing well, BCVA 20/30             - inflammation/posterior debris improving further   - corneal edema improved             - IOP 18 today             - gram stain (2.14.20) shows G+ cocci, WBCs mostly PMNs;   - repeat gram stain from t/i (2.16.20) -- no organisms, just WBCs             - cultures from vitreous grew rare Staph warneri; cultures from t/i -- no growth             - continue Durezol QID OD  - cont Brimonidine Qdaily OD  - PSO PRN  - completed po pred taper -- caused significant elevations in BG  - monitor  4. History of Vitreous Hemorrhage OD -- cleared from PPV for endophthalmitis  - secondary to PDR as described  below  - S/P IVA OD #1 (09.20.19), #2 (10.25.19), #3 (11.15.19), #4 (12.16.19), #5 (03/12/2018)  - S/P PRP OD #1 (09.27.19), fill in OD (11.21.19) -- each somewhat limited inferiorly by residual VH  5,6. Hypertensive retinopathy OU  - discussed importance of tight BP control.  - monitor for now  7. Combined form age related cataract OU-   - The symptoms of cataract, surgical options, and treatments and risks were discussed with patient.  - discussed diagnosis and progression  - not yet visually significant  - monitor for now   Ophthalmic Meds Ordered this visit:  No orders of the defined types were placed in this encounter.      Return for f/u 1-2 weeks, PDR OU, DFE, OCT.  There are no Patient Instructions on file for this visit.   Explained the diagnoses, plan, and follow up with the patient and they expressed understanding.  Patient expressed understanding of the importance of proper follow up care.   This document serves as a record of services personally performed by Gardiner Sleeper, MD, PhD. It was created on their behalf by Ernest Mallick, OA, an ophthalmic assistant. The creation of this record is the provider's dictation and/or activities during the visit.    Electronically signed by: Ernest Mallick, OA  05.18.2020 12:56 PM    Gardiner Sleeper, M.D., Ph.D. Diseases & Surgery of the Retina and Vitreous [facility]  I have reviewed the above documentation for accuracy and completeness, and I agree with the above. Gardiner Sleeper, M.D., Ph.D. 07/16/18 1:02 PM    Abbreviations: M myopia (nearsighted); A astigmatism; H hyperopia (farsighted); P presbyopia; Mrx spectacle prescription;  CTL contact lenses; OD right eye; OS left eye; OU both eyes  XT exotropia; ET esotropia; PEK punctate epithelial keratitis; PEE punctate epithelial erosions; DES dry eye syndrome; MGD meibomian gland dysfunction; ATs artificial tears; PFAT's preservative free artificial  tears; [place] nuclear sclerotic cataract; PSC posterior subcapsular cataract; ERM epi-retinal membrane; PVD posterior vitreous detachment; RD retinal detachment; DM diabetes mellitus; DR diabetic retinopathy; NPDR non-proliferative diabetic retinopathy; PDR proliferative diabetic retinopathy; CSME clinically significant macular edema; DME diabetic macular edema; dbh dot blot hemorrhages; CWS cotton wool spot; POAG primary open angle glaucoma; C/D cup-to-disc ratio; HVF humphrey visual field; GVF goldmann visual field; OCT optical coherence tomography; IOP intraocular pressure; BRVO Branch retinal vein occlusion; CRVO central retinal vein occlusion; CRAO central retinal artery  occlusion; BRAO branch retinal artery occlusion; RT retinal tear; SB scleral buckle; PPV pars plana vitrectomy; VH Vitreous hemorrhage; PRP panretinal laser photocoagulation; IVK intravitreal kenalog; VMT vitreomacular traction; MH Macular hole;  NVD neovascularization of the disc; NVE neovascularization elsewhere; AREDS age related eye disease study; ARMD age related macular degeneration; POAG primary open angle glaucoma; EBMD epithelial/anterior basement membrane dystrophy; ACIOL anterior chamber intraocular lens; IOL intraocular lens; PCIOL posterior chamber intraocular lens; Phaco/IOL phacoemulsification with intraocular lens placement; [place] photorefractive keratectomy; LASIK laser assisted in situ keratomileusis; HTN hypertension; DM diabetes mellitus; COPD chronic obstructive pulmonary disease

## 2018-07-16 ENCOUNTER — Other Ambulatory Visit: Payer: Self-pay

## 2018-07-16 ENCOUNTER — Encounter (INDEPENDENT_AMBULATORY_CARE_PROVIDER_SITE_OTHER): Payer: Self-pay | Admitting: Ophthalmology

## 2018-07-16 ENCOUNTER — Ambulatory Visit (INDEPENDENT_AMBULATORY_CARE_PROVIDER_SITE_OTHER): Payer: BLUE CROSS/BLUE SHIELD | Admitting: Ophthalmology

## 2018-07-16 DIAGNOSIS — H4312 Vitreous hemorrhage, left eye: Secondary | ICD-10-CM | POA: Diagnosis not present

## 2018-07-16 DIAGNOSIS — H4311 Vitreous hemorrhage, right eye: Secondary | ICD-10-CM | POA: Diagnosis not present

## 2018-07-16 DIAGNOSIS — H3581 Retinal edema: Secondary | ICD-10-CM | POA: Diagnosis not present

## 2018-07-16 DIAGNOSIS — H25813 Combined forms of age-related cataract, bilateral: Secondary | ICD-10-CM

## 2018-07-16 DIAGNOSIS — E113513 Type 2 diabetes mellitus with proliferative diabetic retinopathy with macular edema, bilateral: Secondary | ICD-10-CM | POA: Diagnosis not present

## 2018-07-16 DIAGNOSIS — I10 Essential (primary) hypertension: Secondary | ICD-10-CM

## 2018-07-16 DIAGNOSIS — H44001 Unspecified purulent endophthalmitis, right eye: Secondary | ICD-10-CM | POA: Diagnosis not present

## 2018-07-16 DIAGNOSIS — H35033 Hypertensive retinopathy, bilateral: Secondary | ICD-10-CM

## 2018-07-23 ENCOUNTER — Other Ambulatory Visit (INDEPENDENT_AMBULATORY_CARE_PROVIDER_SITE_OTHER): Payer: Self-pay | Admitting: Ophthalmology

## 2018-07-24 ENCOUNTER — Encounter (INDEPENDENT_AMBULATORY_CARE_PROVIDER_SITE_OTHER): Payer: BLUE CROSS/BLUE SHIELD | Admitting: Ophthalmology

## 2018-07-25 NOTE — Progress Notes (Signed)
[facility] Note  07/26/2018     CHIEF COMPLAINT Patient presents for Retina Follow Up   HISTORY OF PRESENT ILLNESS: Gloria Gomez is a 48 y.o. female who presents to the clinic today for:   HPI    Retina Follow Up    Patient presents with  Other.  In left eye.  This started 1 month ago.  Severity is moderate.  Duration of 10 days.  Since onset it is stable.  I, the attending physician,  performed the HPI with the patient and updated documentation appropriately.          Comments    48 y/o female pt here for 10 day f/u for VH OS.  No change in [place] OU.  Denies pain, flashes.  Has some small floaters OU.  Durezol QID OU, Brimonidine QHS OU.       Last edited by Bernarda Caffey, MD on 07/26/2018  3:10 PM. (History)    pt states states she feels like her vision is stable and the bleeding has mostly cleared up, pt states she is still using Durezol QID OD   Referring physician: Tammi Sou, MD [address] [place], [place] [address]  HISTORICAL INFORMATION:   Selected notes from the MEDICAL RECORD NUMBER Referred for DM exam LEE:  Ocular Hx-NPDR PMH-DM (takes invokana and metformin), high cholesterol, benign brain tumor, white coat HTN    CURRENT MEDICATIONS: Current Outpatient Medications (Ophthalmic Drugs)  Medication Sig  . brimonidine (ALPHAGAN) 0.15 % ophthalmic solution Place 1 drop into the right eye 2 (two) times daily. (Patient taking differently: Place 1 drop into the right eye 2 (two) times daily. Pt applies 1 drop in right eye.)  . Difluprednate (DUREZOL) 0.05 % EMUL Place 1 drop into the right eye 4 (four) times daily.  . prednisoLONE acetate (PRED FORTE) 1 % ophthalmic suspension Place 1 drop into the right eye every hour while awake. (Patient not taking: Reported on 06/11/2018)   No current facility-administered medications for this visit.  (Ophthalmic Drugs)   Current Outpatient Medications (Other)  Medication Sig  .  glipiZIDE (GLUCOTROL XL) 10 MG 24 hr tablet TAKE 1 TABLET BY MOUTH WITH BREAKFAST  . HYDROcodone-acetaminophen (NORCO/VICODIN) 5-325 MG tablet Take 1-2 tablets by mouth every 6 (six) hours as needed for moderate pain.  . INVOKANA 300 MG TABS tablet TAKE 1 TABLET BY MOUTH ONCE DAILY BEFORE  BREAKFAST  . losartan (COZAAR) 100 MG tablet Take 1 tablet (100 mg total) by mouth daily.  . metFORMIN (GLUCOPHAGE-XR) 500 MG 24 hr tablet Take 2 tablets (1,000 mg total) by mouth 2 (two) times daily.  . metoprolol succinate (TOPROL-XL) 50 MG 24 hr tablet Take 1 tablet (50 mg total) by mouth daily. Take with or immediately following a meal.  . pantoprazole (PROTONIX) 40 MG tablet Take 1 tablet by mouth once daily  . promethazine (PHENERGAN) 12.5 MG tablet 1-2 tabs po q6h prn nausea  . temazepam (RESTORIL) 15 MG capsule Take 1 capsule (15 mg total) by mouth at bedtime as needed for sleep.   No current facility-administered medications for this visit.  (Other)      REVIEW OF SYSTEMS: ROS    Positive for: Endocrine, Eyes   Negative for: Constitutional, Gastrointestinal, Neurological, Skin, Genitourinary, Musculoskeletal, HENT, Cardiovascular, Respiratory, Psychiatric, Allergic/Imm, Heme/Lymph   Last edited by Matthew Folks, COA on 07/26/2018  1:45 PM. (History)       ALLERGIES Allergies  Allergen  Reactions  . Gluten Meal Swelling  . Lisinopril Cough  . Pioglitazone Other (See Comments)    ELEVATED glucoses + worse chronic nausea    PAST MEDICAL HISTORY Past Medical History:  Diagnosis Date  . Abdominal bloating    likely from diab gastroparesis.  Dr. Havery Moros to do EGD as of 10/2017 GI eval.  . Benign brain tumor ([place])    Cystic lesion in cerebral aqueduct region with mild hydrocephalus-- stable MRI 02/2016.  Surveillance MRI 05/2017 --dilated cerebral aqueduct related to aqueductal stenosis and subsequent mild hydrocephalus (due to the 11 mm stable cystic lesion in cerebral  aqueduct---?congenitial?.  . Cataract    OU  . Dysmenorrhea    vicodin occ during first 2 days of cycle.  . Gluten intolerance    pt reports she underwent full GI w/u to r/o celiac dz  . Hepatic steatosis    ultrasound 08/2017  . Hyperlipidemia, mixed   . Hypertensive retinopathy    OU  . Hypertensive retinopathy of both eyes   . Proliferative diabetic retinopathy of both eyes ([place])    steroid injections 10/2017--improved  . Sensorineural hearing loss of left ear    Sudden left hearing loss summer 2016--no improvement with steroids 01/2015 so brain MRI done by Dr. Redmond Baseman and it showed brain tumor that was determined to be benign.  Pt's hearing not bad enough for hearing aid as of 06/2016.  . Type 2 diabetes with complication ([place])    diab retpthy, diabetic gastroparesis (gastric emptying study mildly abnl 03/2017)  . White coat hypertension    Past Surgical History:  Procedure Laterality Date  . CHOLECYSTECTOMY  2000  . EYE SURGERY    . GASTRIC EMPTYING SCAN  04/20/2017   Mildly abnormal, particularly the 1st hour of emptying.  Marland Kitchen PARS PLANA VITRECTOMY Right 04/12/2018   Procedure: Right PARS PLANA VITRECTOMY WITH 25 GAUGE with intravitreal antibiotics;  Surgeon: Bernarda Caffey, MD;  Location: [facility];  Service: Ophthalmology;  Laterality: Right;    FAMILY HISTORY Family History  Problem Relation Age of Onset  . Brain cancer Mother   . Diabetes Father   . Diabetes Maternal Grandmother   . Cataracts Maternal Grandmother   . Cervical cancer Paternal Grandmother   . Colon cancer Maternal Grandfather 47  . Amblyopia Neg Hx   . Blindness Neg Hx   . Glaucoma Neg Hx   . Macular degeneration Neg Hx   . Retinal detachment Neg Hx   . Strabismus Neg Hx   . Retinitis pigmentosa Neg Hx     SOCIAL HISTORY Social History   Tobacco Use  . Smoking status: Never Smoker  . Smokeless tobacco: Never Used  Substance Use Topics  . Alcohol use: No  . Drug use: No         OPHTHALMIC  EXAM:  Base Eye Exam    Visual Acuity (Snellen - Linear)      Right Left   Dist cc 20/50 20/40   Dist ph cc 20/30 -2 20/30 +2   Correction:  Glasses       Tonometry (Tonopen, 1:50 PM)      Right Left   Pressure 16 17       Pupils      Dark Light Shape React APD   Right 4 3 Round Brisk None   Left 4 3 Round Brisk None       Visual Fields (Counting fingers)      Left Right    Full Full  Extraocular Movement      Right Left    Full, Ortho Full, Ortho       Neuro/Psych    Oriented x3:  Yes   Mood/Affect:  Normal       Dilation    Both eyes:  1.0% Mydriacyl, 2.5% Phenylephrine @ 1:50 PM        Slit Lamp and Fundus Exam    Slit Lamp Exam      Right Left   Lids/Lashes Dermatochalasis - upper lid, Meibomian gland dysfunction, Telangiectasia Dermatochalasis - upper lid, Meibomian gland dysfunction, Telangiectasia   Conjunctiva/Sclera White and quiet White and quiet   Cornea Clear, mild endopigment inferior, 1+ Punctate epithelial erosions 1+ Punctate epithelial erosions   Anterior Chamber Clear, no Cell/pigment, no hypopyon  Deep and quiet   Iris Round and dilated, No NVI Round and dilated, No NVI   Lens 2+ Nuclear sclerosis, 2+ Cortical cataract, 1+Posterior subcapsular cataract 2+ Nuclear sclerosis, 2+ Cortical cataract   Vitreous post vitrectomy; improving cell, scattered vitreous debris - clearing and settling inferiorly, vitreous condensations - improving Vitreous syneresis, residual blood stained vitreous improved and settling inferiorly, red blot clots settled inferiorly       Fundus Exam      Right Left   Disc Pink and Sharp, mild fibrosis superiorly with fine NVD and traction extending superiorly and along ST arcade hazy view; Pink and sharp   C/D Ratio 0.1 0.1   Macula Flat, Blunted foveal reflex, cluster of MA and exudates temporal to fovea Flat, blunted foveal reflex, clusted of scattered MA/exudate temporal macula   Vessels mild Vascular attenuation,  tractional fibrosis along ST arcades Vascular attenuation, mild Tortuousity   Periphery Attached, scattered DBH, scattered PRP  Attached, inferior retina obscured by VH; scattered DBH, good 360 PRP laser changes - room for fill-in          IMAGING AND PROCEDURES  Imaging and Procedures for @TODAY @  OCT, Retina - OU - Both Eyes       Right Eye Quality was good. Central Foveal Thickness: 337. Progression has been stable. Findings include abnormal foveal contour, epiretinal membrane, no SRF, intraretinal hyper-reflective material, vitreous traction, preretinal fibrosis (Stable to slighlty improved IRF, stable vitreous traction along ST arcades).   Left Eye Quality was good. Central Foveal Thickness: 302. Progression has improved. Findings include intraretinal fluid, no SRF, normal foveal contour, intraretinal hyper-reflective material, epiretinal membrane (Interval improvement in vitreous opacities - esentially resolved, interval improvement in IRHM/IRF).   Notes *Images captured and stored on drive  Diagnosis / Impression:  OD: persistent DME --  Stable to slighlty improved IRF, stable vitreous traction along ST arcades OS: abnormal foveal contour; +IRF; Interval improvement in vitreous opacities - esentially resolved, interval improvement in IRHM/IRF  Clinical management:  See below  Abbreviations: NFP - Normal foveal profile. CME - cystoid macular edema. PED - pigment epithelial detachment. IRF - intraretinal fluid. SRF - subretinal fluid. EZ - ellipsoid zone. ERM - epiretinal membrane. ORA - outer retinal atrophy. ORT - outer retinal tubulation. SRHM - subretinal hyper-reflective material         Intravitreal Injection, Pharmacologic Agent - OS - Left Eye       Time Out 07/26/2018. 2:38 PM. Confirmed correct patient, procedure, site, and patient consented.   Anesthesia Topical anesthesia was used. Anesthetic medications included Lidocaine 2%, Proparacaine 0.5%.    Procedure Preparation included 5% betadine to ocular surface, eyelid speculum. A supplied needle was used.   Injection:  1.25  mg Bevacizumab (AVASTIN) SOLN   NDC: 94854-627-03, Lot: [id number] , Expiration date: 09/18/2018   Route: Intravitreal, Site: Left Eye, Waste: 0 mL  Post-op Post injection exam found visual acuity of at least counting fingers. The patient tolerated the procedure well. There were no complications. The patient received written and verbal post procedure care education. Post injection medications (Polymyxin B Sulfate applied OU after Avastin inj.).        Intravitreal Injection, Pharmacologic Agent - OD - Right Eye       Time Out 07/26/2018. 2:38 PM. Confirmed correct patient, procedure, site, and patient consented.   Anesthesia Topical anesthesia was used. Anesthetic medications included Lidocaine 2%, Proparacaine 0.5%.   Procedure Preparation included 5% betadine to ocular surface, eyelid speculum. A supplied needle was used.   Injection:  1.25 mg Bevacizumab (AVASTIN) SOLN   NDC: 08/08/2018, Lot: [id number] , Expiration date: 09/23/2018   Route: Intravitreal, Site: Right Eye, Waste: 0 mL  Post-op Post injection exam found visual acuity of at least counting fingers. The patient tolerated the procedure well. There were no complications. The patient received written and verbal post procedure care education.                ASSESSMENT/PLAN:    ICD-10-CM   1. Proliferative diabetic retinopathy of both eyes with macular edema associated with type 2 diabetes mellitus ([place]) 10/06/2018   2. Retinal edema H35.81 OCT, Retina - OU - Both Eyes  3. Vitreous hemorrhage of left eye (HCC) H43.12 Intravitreal Injection, Pharmacologic Agent - OS - Left Eye    Intravitreal Injection, Pharmacologic Agent - OD - Right Eye  4. Right endophthalmia H44.001   5. Vitreous hemorrhage, right eye ([place]) H43.11   6. Essential hypertension I10   7. Hypertensive retinopathy of  both eyes H35.033   8. Combined forms of age-related cataract of both eyes H25.813     1,2.  Proliferative diabetic retinopathy w/ DME, OU  - s/p IVA OD #1 9.20.19, #2 (10.25.19), #3 (11.15.19), #4 (12.17.19), #5 (01.14.20), #6 (2.11.20)  - s/p IVA OS #1 9.27.19, #2 (10.25.19), #3 (11.15.19), #4 (12.17.19), #5 (01.14.20), #6 (2.11.20), #7 (04.26.20)  - S/P PRP OS (09.20.19), (5.19.20)  - S/P PRP OD (9.27.19 and 11.21.19), fill-in (04.14.20)  - OD doing well -- VA stable at 20/30, but there is some preretinal fibrosis / tractional membranes and mild central DME  - HbA1c 8.4% (02.14.20) from 7.7 (10.02.19)  - FA (9.20.19) shows +NVE OU and leaking MA and capillary nonperfusion  - repeat FA 11.15.19 shows NV regressing OU  - exam shows early tractional fibrosis along ST arcades on fine NVD superior disc  - recommend IVA OU today (OD #7, OS #8) today, 05.29.20  - RBA of procedure discussed, questions answered  - informed consent obtained and signed  - see procedure note  - f/u in 4 wks for DFE/OCT/possible injection  3. Vitreous hemorrhage OS  - new onset 4.24.20  - s/p IVA OS #7 on 4.26.20  - today, VH improving -- clearing and settling inferiorly  - BCVA stable at 20/30 +2  - etiology: secondary to PDR as described below (no RT/RD on exam)  - s/p multiple IVA OS for DME (see above)  - s/p PRP OS (9.20.19), (05.19.20)  - recommend IVA OS #8 today, 05.29.20 as above  4. Endophthalmitis OD  - s/p IVA OU 04/09/2018  - s/p 25g PPV w/ intravitreal vanc, ceftaz and cefepime OD, 2.14.2020  - s/p intravitreal tap / vanc and ceflaz  injections (02.16.20)             - doing well, BCVA 20/30             - inflammation/posterior debris essentially resolved now  - corneal edema improved             - IOP 16 today             - gram stain (2.14.20) shows G+ cocci, WBCs mostly PMNs;   - repeat gram stain from t/i (2.16.20) -- no organisms, just WBCs             - cultures from vitreous grew rare  Staph warneri; cultures from t/i -- no growth             - begin Durezol taper -- 3,2,1 drops daily, decrease weekly  - cont Brimonidine Qdaily OD  - PSO PRN  - completed po pred taper -- caused significant elevations in BG  - monitor  5. History of Vitreous Hemorrhage OD -- cleared from PPV for endophthalmitis  - secondary to PDR as described below  - S/P IVA OD #1 (09.20.19), #2 (10.25.19), #3 (11.15.19), #4 (12.16.19), #5 (03/12/2018)  - S/P PRP OD #1 (09.27.19), fill in OD (11.21.19) -- each somewhat limited inferiorly by residual VH  5,6. Hypertensive retinopathy OU  - discussed importance of tight BP control.  - monitor for now  7. Combined form age related cataract OU-   - The symptoms of cataract, surgical options, and treatments and risks were discussed with patient.  - discussed diagnosis and progression  - not yet visually significant  - monitor for now   Ophthalmic Meds Ordered this visit:  No orders of the defined types were placed in this encounter.      Return in about 4 weeks (around 08/23/2018) for f/u PDR OU, DFE, OCT.  There are no Patient Instructions on file for this visit.   Explained the diagnoses, plan, and follow up with the patient and they expressed understanding.  Patient expressed understanding of the importance of proper follow up care.   This document serves as a record of services personally performed by Gardiner Sleeper, MD, PhD. It was created on their behalf by Ernest Mallick, OA, an ophthalmic assistant. The creation of this record is the provider's dictation and/or activities during the visit.    Electronically signed by: Ernest Mallick, OA  05.28.2020 3:12 PM    Gardiner Sleeper, M.D., Ph.D. Diseases & Surgery of the Retina and Vitreous [facility]  I have reviewed the above documentation for accuracy and completeness, and I agree with the above. Gardiner Sleeper, M.D., Ph.D. 07/26/18 3:12 PM    Abbreviations: M  myopia (nearsighted); A astigmatism; H hyperopia (farsighted); P presbyopia; Mrx spectacle prescription;  CTL contact lenses; OD right eye; OS left eye; OU both eyes  XT exotropia; ET esotropia; PEK punctate epithelial keratitis; PEE punctate epithelial erosions; DES dry eye syndrome; MGD meibomian gland dysfunction; ATs artificial tears; PFAT's preservative free artificial tears; [place] nuclear sclerotic cataract; PSC posterior subcapsular cataract; ERM epi-retinal membrane; PVD posterior vitreous detachment; RD retinal detachment; DM diabetes mellitus; DR diabetic retinopathy; NPDR non-proliferative diabetic retinopathy; PDR proliferative diabetic retinopathy; CSME clinically significant macular edema; DME diabetic macular edema; dbh dot blot hemorrhages; CWS cotton wool spot; POAG primary open angle glaucoma; C/D cup-to-disc ratio; HVF humphrey visual field; GVF goldmann visual field; OCT optical coherence tomography; IOP intraocular pressure; BRVO Branch retinal vein  occlusion; CRVO central retinal vein occlusion; CRAO central retinal artery occlusion; BRAO branch retinal artery occlusion; RT retinal tear; SB scleral buckle; PPV pars plana vitrectomy; VH Vitreous hemorrhage; PRP panretinal laser photocoagulation; IVK intravitreal kenalog; VMT vitreomacular traction; MH Macular hole;  NVD neovascularization of the disc; NVE neovascularization elsewhere; AREDS age related eye disease study; ARMD age related macular degeneration; POAG primary open angle glaucoma; EBMD epithelial/anterior basement membrane dystrophy; ACIOL anterior chamber intraocular lens; IOL intraocular lens; PCIOL posterior chamber intraocular lens; Phaco/IOL phacoemulsification with intraocular lens placement; [place] photorefractive keratectomy; LASIK laser assisted in situ keratomileusis; HTN hypertension; DM diabetes mellitus; COPD chronic obstructive pulmonary disease

## 2018-07-26 ENCOUNTER — Other Ambulatory Visit: Payer: Self-pay

## 2018-07-26 ENCOUNTER — Ambulatory Visit (INDEPENDENT_AMBULATORY_CARE_PROVIDER_SITE_OTHER): Payer: BLUE CROSS/BLUE SHIELD | Admitting: Ophthalmology

## 2018-07-26 ENCOUNTER — Encounter (INDEPENDENT_AMBULATORY_CARE_PROVIDER_SITE_OTHER): Payer: Self-pay | Admitting: Ophthalmology

## 2018-07-26 DIAGNOSIS — I10 Essential (primary) hypertension: Secondary | ICD-10-CM | POA: Diagnosis not present

## 2018-07-26 DIAGNOSIS — H35033 Hypertensive retinopathy, bilateral: Secondary | ICD-10-CM

## 2018-07-26 DIAGNOSIS — H25813 Combined forms of age-related cataract, bilateral: Secondary | ICD-10-CM

## 2018-07-26 DIAGNOSIS — H4311 Vitreous hemorrhage, right eye: Secondary | ICD-10-CM | POA: Diagnosis not present

## 2018-07-26 DIAGNOSIS — H44001 Unspecified purulent endophthalmitis, right eye: Secondary | ICD-10-CM | POA: Diagnosis not present

## 2018-07-26 DIAGNOSIS — E113513 Type 2 diabetes mellitus with proliferative diabetic retinopathy with macular edema, bilateral: Secondary | ICD-10-CM | POA: Diagnosis not present

## 2018-07-26 DIAGNOSIS — H4312 Vitreous hemorrhage, left eye: Secondary | ICD-10-CM | POA: Diagnosis not present

## 2018-07-26 DIAGNOSIS — H3581 Retinal edema: Secondary | ICD-10-CM | POA: Diagnosis not present

## 2018-07-26 MED ORDER — BEVACIZUMAB CHEMO INJECTION 1.25MG/0.05ML SYRINGE FOR KALEIDOSCOPE
1.2500 mg | INTRAVITREAL | Status: AC | PRN
  Administered 2018-07-26: 1.25 mg via INTRAVITREAL

## 2018-08-21 NOTE — Progress Notes (Addendum)
[facility] Note  08/23/2018     CHIEF COMPLAINT Patient presents for Retina Follow Up   HISTORY OF PRESENT ILLNESS: Gloria Gomez is a 48 y.o. female who presents to the clinic today for:   HPI    Retina Follow Up    Patient presents with  Diabetic Retinopathy.  In both eyes.  This started 4 weeks ago.  Severity is moderate.  I, the attending physician,  performed the HPI with the patient and updated documentation appropriately.          Comments    Patient here for 4 weeks retina follow up for PDR with DME OU. Patient states vision is ok. OS feels like has more bleeding. Cloudy. Not seeing floaters but sees a squiggly line. No eye pain. Possible change of blood pressure med. DM med was recalled being changed to something else. Will find out next week.       Last edited by Bernarda Caffey, MD on 08/23/2018  2:40 PM. (History)    pt states she started noticing a bunch of new floaters in her left eye over the weekend, she states she has been more active over the past week, she states her blood pressure has been in the 150s which is too high, she states she called her PCP and he is trying to decide what to do about her blood pressure, she states she got a letter in the mail stating that her metformin has been recalled, so her dr told her to cut her dose in half until he can figure out what to put her on, she states she is following a very strict low carb diet, but she has also been under a lot of stress  Referring physician: Tammi Sou, MD [address] [place],  [place] [address]  HISTORICAL INFORMATION:   Selected notes from the MEDICAL RECORD NUMBER Referred for DM exam LEE:  Ocular Hx-NPDR PMH-DM (takes invokana and metformin), high cholesterol, benign brain tumor, white coat HTN    CURRENT MEDICATIONS: Current Outpatient Medications (Ophthalmic Drugs)  Medication Sig  . brimonidine (ALPHAGAN) 0.15 % ophthalmic solution Place 1 drop  into the right eye 2 (two) times daily. (Patient taking differently: Place 1 drop into the right eye 2 (two) times daily. Pt applies 1 drop in right eye.)  . Difluprednate (DUREZOL) 0.05 % EMUL Place 1 drop into the right eye 4 (four) times daily.  . prednisoLONE acetate (PRED FORTE) 1 % ophthalmic suspension Place 1 drop into the right eye every hour while awake. (Patient not taking: Reported on 06/11/2018)   No current facility-administered medications for this visit.  (Ophthalmic Drugs)   Current Outpatient Medications (Other)  Medication Sig  . glipiZIDE (GLUCOTROL XL) 10 MG 24 hr tablet TAKE 1 TABLET BY MOUTH WITH BREAKFAST  . HYDROcodone-acetaminophen (NORCO/VICODIN) 5-325 MG tablet Take 1-2 tablets by mouth every 6 (six) hours as needed for moderate pain.  . INVOKANA 300 MG TABS tablet TAKE 1 TABLET BY MOUTH ONCE DAILY BEFORE  BREAKFAST  . losartan (COZAAR) 100 MG tablet Take 1 tablet (100 mg total) by mouth daily.  . metFORMIN (GLUCOPHAGE-XR) 500 MG 24 hr tablet Take 2 tablets (1,000 mg total) by mouth 2 (two) times daily.  . metoprolol succinate (TOPROL-XL) 50 MG 24 hr tablet Take 1 tablet (50 mg total) by mouth daily. Take with or immediately following a meal.  . pantoprazole (PROTONIX) 40 MG tablet Take 1 tablet by mouth  once daily  . promethazine (PHENERGAN) 12.5 MG tablet 1-2 tabs po q6h prn nausea  . temazepam (RESTORIL) 15 MG capsule Take 1 capsule (15 mg total) by mouth at bedtime as needed for sleep.   No current facility-administered medications for this visit.  (Other)      REVIEW OF SYSTEMS: ROS    Positive for: Endocrine, Eyes   Negative for: Constitutional, Gastrointestinal, Neurological, Skin, Genitourinary, Musculoskeletal, HENT, Cardiovascular, Respiratory, Psychiatric, Allergic/Imm, Heme/Lymph   Last edited by Theodore Demark on 08/23/2018  1:23 PM. (History)       ALLERGIES Allergies  Allergen Reactions  . Gluten Meal Swelling  . Lisinopril Cough  .  Pioglitazone Other (See Comments)    ELEVATED glucoses + worse chronic nausea    PAST MEDICAL HISTORY Past Medical History:  Diagnosis Date  . Abdominal bloating    likely from diab gastroparesis.  Dr. Havery Moros to do EGD as of 10/2017 GI eval.  . Benign brain tumor ([place])    Cystic lesion in cerebral aqueduct region with mild hydrocephalus-- stable MRI 02/2016.  Surveillance MRI 05/2017 --dilated cerebral aqueduct related to aqueductal stenosis and subsequent mild hydrocephalus (due to the 11 mm stable cystic lesion in cerebral aqueduct---?congenitial?.  . Cataract    OU  . Dysmenorrhea    vicodin occ during first 2 days of cycle.  . Gluten intolerance    pt reports she underwent full GI w/u to r/o celiac dz  . Hepatic steatosis    ultrasound 08/2017  . Hyperlipidemia, mixed   . Hypertensive retinopathy    OU  . Hypertensive retinopathy of both eyes   . Proliferative diabetic retinopathy of both eyes ([place])    steroid injections 10/2017--improved  . Sensorineural hearing loss of left ear    Sudden left hearing loss summer 2016--no improvement with steroids 01/2015 so brain MRI done by Dr. Redmond Baseman and it showed brain tumor that was determined to be benign.  Pt's hearing not bad enough for hearing aid as of 06/2016.  . Type 2 diabetes with complication ([place])    diab retpthy, diabetic gastroparesis (gastric emptying study mildly abnl 03/2017)  . White coat hypertension    Past Surgical History:  Procedure Laterality Date  . CHOLECYSTECTOMY  2000  . EYE SURGERY    . GASTRIC EMPTYING SCAN  04/20/2017   Mildly abnormal, particularly the 1st hour of emptying.  Marland Kitchen PARS PLANA VITRECTOMY Right 04/12/2018   Procedure: Right PARS PLANA VITRECTOMY WITH 25 GAUGE with intravitreal antibiotics;  Surgeon: Bernarda Caffey, MD;  Location: [facility];  Service: Ophthalmology;  Laterality: Right;    FAMILY HISTORY Family History  Problem Relation Age of Onset  . Brain cancer Mother   . Diabetes Father   .  Diabetes Maternal Grandmother   . Cataracts Maternal Grandmother   . Cervical cancer Paternal Grandmother   . Colon cancer Maternal Grandfather 100  . Amblyopia Neg Hx   . Blindness Neg Hx   . Glaucoma Neg Hx   . Macular degeneration Neg Hx   . Retinal detachment Neg Hx   . Strabismus Neg Hx   . Retinitis pigmentosa Neg Hx     SOCIAL HISTORY Social History   Tobacco Use  . Smoking status: Never Smoker  . Smokeless tobacco: Never Used  Substance Use Topics  . Alcohol use: No  . Drug use: No         OPHTHALMIC EXAM:  Base Eye Exam    Visual Acuity (Snellen - Linear)  Right Left   Dist cc 20/40 -1 20/40   Dist ph cc 20/30 +1 20/30 -1   Correction: Glasses       Tonometry (Tonopen, 1:19 PM)      Right Left   Pressure 22 19       Pupils      Dark Light Shape React APD   Right 4 3 Round Brisk None   Left 4 3 Round Brisk None       Visual Fields (Counting fingers)      Left Right    Full Full       Extraocular Movement      Right Left    Full, Ortho Full, Ortho       Neuro/Psych    Oriented x3: Yes   Mood/Affect: Normal       Dilation    Both eyes: 1.0% Mydriacyl, 2.5% Phenylephrine @ 1:19 PM        Slit Lamp and Fundus Exam    Slit Lamp Exam      Right Left   Lids/Lashes Dermatochalasis - upper lid, Meibomian gland dysfunction, Telangiectasia Dermatochalasis - upper lid, Meibomian gland dysfunction, Telangiectasia   Conjunctiva/Sclera White and quiet White and quiet   Cornea Clear, mild endopigment inferior, 1+ Punctate epithelial erosions 1+ Punctate epithelial erosions   Anterior Chamber Clear, no Cell/pigment, no hypopyon  Deep and quiet   Iris Round and dilated, No NVI Round and dilated, No NVI   Lens 2+ Nuclear sclerosis, 2+ Cortical cataract, 1+Posterior subcapsular cataract 2+ Nuclear sclerosis, 2+ Cortical cataract   Vitreous post vitrectomy; improving cell, scattered vitreous debris - clearing and settling inferiorly, vitreous  condensations - improving Vitreous syneresis, new hemorrhage and opacities, may be emenating from disc       Fundus Exam      Right Left   Disc Pink and Sharp, mild fibrosis superiorly with fine NVD and traction extending superiorly and along ST arcade - stable hazy view; Pink and sharp, apparent hemorrhage at 0600 eminating into vitreous cavity    C/D Ratio 0.1 0.1   Macula Flat, Blunted foveal reflex, cluster of MA and exudates temporal to fovea Flat, blunted foveal reflex, clusted of scattered MA/exudate temporal macula   Vessels mild Vascular attenuation, tractional fibrosis along ST arcades Vascular attenuation, mild Tortuousity   Periphery Attached, scattered IRH, 360 PRP  Attached, inferior retina obscured by VH; scattered DBH, good 360 PRP laser changes        Refraction    Wearing Rx      Sphere Cylinder   Right -3.75 Sphere   Left -3.75 Sphere          IMAGING AND PROCEDURES  Imaging and Procedures for @TODAY @  OCT, Retina - OU - Both Eyes       Right Eye Quality was good. Central Foveal Thickness: 317. Progression has improved. Findings include abnormal foveal contour, epiretinal membrane, no SRF, intraretinal hyper-reflective material, vitreous traction, preretinal fibrosis (Interval improved in IRF).   Left Eye Quality was good. Central Foveal Thickness: 302. Progression has worsened. Findings include intraretinal fluid, no SRF, normal foveal contour, intraretinal hyper-reflective material, epiretinal membrane (Mild interval improvement in cystic changes, interval increase in vitreous opacities).   Notes *Images captured and stored on drive  Diagnosis / Impression:  OD: persistent DME --  Interval improved in IRF OS: abnormal foveal contour; +IRF; Mild interval improvement in cystic changes, interval increase in vitreous opacities  Clinical management:  See below  Abbreviations: NFP -  Normal foveal profile. CME - cystoid macular edema. PED - pigment  epithelial detachment. IRF - intraretinal fluid. SRF - subretinal fluid. EZ - ellipsoid zone. ERM - epiretinal membrane. ORA - outer retinal atrophy. ORT - outer retinal tubulation. SRHM - subretinal hyper-reflective material         Intravitreal Injection, Pharmacologic Agent - OS - Left Eye       Time Out 08/23/2018. 4:03 PM. Confirmed correct patient, procedure, site, and patient consented.   Anesthesia Topical anesthesia was used. Anesthetic medications included Lidocaine 2%, Proparacaine 0.5%.   Procedure Preparation included 5% betadine to ocular surface, eyelid speculum. A supplied needle was used.   Injection:  1.25 mg Bevacizumab (AVASTIN) SOLN   NDC: 14481-856-31, Lot: [id number] , Expiration date: 09/18/2018   Route: Intravitreal, Site: Left Eye, Waste: 0 mL  Post-op Post injection exam found visual acuity of at least counting fingers. The patient tolerated the procedure well. There were no complications. The patient received written and verbal post procedure care education.                ASSESSMENT/PLAN:    ICD-10-CM   1. Proliferative diabetic retinopathy of both eyes with macular edema associated with type 2 diabetes mellitus (HCC)  10/01/2018 Intravitreal Injection, Pharmacologic Agent - OS - Left Eye    Bevacizumab (AVASTIN) SOLN 1.25 mg    CANCELED: Intravitreal Injection, Pharmacologic Agent - OD - Right Eye  2. Retinal edema  H35.81 OCT, Retina - OU - Both Eyes  3. Vitreous hemorrhage of left eye (HCC)  H43.12 Intravitreal Injection, Pharmacologic Agent - OS - Left Eye    Bevacizumab (AVASTIN) SOLN 1.25 mg  4. Right endophthalmia  H44.001   5. Vitreous hemorrhage, right eye ([facility])  H43.11   6. Essential hypertension  I10   7. Hypertensive retinopathy of both eyes  H35.033   8. Combined forms of age-related cataract of both eyes  H25.813   9. Vitreous hemorrhage of right eye (HCC)  H43.11     1,2.  Proliferative diabetic retinopathy w/ DME, OU  - s/p  IVA OD #1 9.20.19, #2 (10.25.19), #3 (11.15.19), #4 (12.17.19), #5 (01.14.20), #6 (2.11.20), #7 (05.29.20)  - s/p IVA OS #1 9.27.19, #2 (10.25.19), #3 (11.15.19), #4 (12.17.19), #5 (01.14.20), #6 (2.11.20), #7 (04.26.20), #8 (05.29.20)  - S/P PRP OS (09.20.19), (5.19.20)  - S/P PRP OD (9.27.19 and 11.21.19), fill-in (04.14.20)  - OD doing well -- VA stable at 20/30, but there is some preretinal fibrosis / tractional membranes and mild central DME  - HbA1c 8.4% (02.14.20) from 7.7 (10.02.19)  - FA (9.20.19) shows +NVE OU and leaking MA and capillary nonperfusion  - repeat FA 11.15.19 shows NV regressing OU  - exam shows worsened VH OS -- fresh heme, but OCT with minimal DME OS  - recommend IVA OS #9 today, 06.26.20  - RBA of procedure discussed, questions answered  - informed consent obtained and signed  - see procedure note  - f/u in 2-3 wks for DFE/OCT/VH f/u  3. Vitreous hemorrhage OS  - new onset 4.24.20  - s/p IVA OS #7 on 4.26.20  - today, VH slightly worse -- pt reports new onset heme over the weekend  - pt reports recent elevations in BP and also changes in metformin dosing due to manufacturer's recall  - BCVA stable at 20/30 OS  - etiology: secondary to PDR as described below (no RT/RD on exam)  - s/p multiple IVA OS for DME (see above)  -  s/p PRP OS (9.20.19), (05.19.20)  - recommend IVA OS #9 today, 06.26.20 as above  - 2-3 wks for [facility] f/u  4. Endophthalmitis OD  - s/p IVA OU 04/09/2018  - s/p 25g PPV w/ intravitreal vanc, ceftaz and cefepime OD, 2.14.2020  - s/p intravitreal tap / vanc and ceflaz injections (02.16.20)             - gram stain (2.14.20) shows G+ cocci, WBCs mostly PMNs;   - repeat gram stain from t/i (2.16.20) -- no organisms, just WBCs             - cultures from vitreous grew rare Staph warneri; cultures from t/i -- no growth             - doing well, BCVA 20/30             - inflammation/posterior debris essentially resolved now  - corneal edema  resolved             - IOP 22 today             - completed Durezol taper   - inc Brimonidine BID OD  - PSO PRN  - completed po pred taper -- caused significant elevations in BG  - monitor  5. History of Vitreous Hemorrhage OD -- cleared from PPV for endophthalmitis  - secondary to PDR as described below  - S/P IVA OD #1 (09.20.19), #2 (10.25.19), #3 (11.15.19), #4 (12.16.19), #5 (03/12/2018)  - S/P PRP OD #1 (09.27.19), fill in OD (11.21.19) -- each somewhat limited inferiorly by residual VH  5,6. Hypertensive retinopathy OU  - discussed importance of tight BP control.  - monitor for now  7. Combined form age related cataract OU-   - The symptoms of cataract, surgical options, and treatments and risks were discussed with patient.  - discussed diagnosis and progression  - not yet visually significant  - monitor for now   Ophthalmic Meds Ordered this visit:  Meds ordered this encounter  Medications  . Bevacizumab (AVASTIN) SOLN 1.25 mg       Return for f/u 2-3 weeks, PDR OU, DFE, OCT.  There are no Patient Instructions on file for this visit.   Explained the diagnoses, plan, and follow up with the patient and they expressed understanding.  Patient expressed understanding of the importance of proper follow up care.   This document serves as a record of services personally performed by Gardiner Sleeper, MD, PhD. It was created on their behalf by Ernest Mallick, OA, an ophthalmic assistant. The creation of this record is the provider's dictation and/or activities during the visit.    Electronically signed by: Ernest Mallick, OA  06.24.2020 5:58 PM     Gardiner Sleeper, M.D., Ph.D. Diseases & Surgery of the Retina and Vitreous [facility]  I have reviewed the above documentation for accuracy and completeness, and I agree with the above. Gardiner Sleeper, M.D., Ph.D. 08/23/18 6:01 PM    Abbreviations: M myopia (nearsighted); A astigmatism; H hyperopia  (farsighted); P presbyopia; Mrx spectacle prescription;  CTL contact lenses; OD right eye; OS left eye; OU both eyes  XT exotropia; ET esotropia; PEK punctate epithelial keratitis; PEE punctate epithelial erosions; DES dry eye syndrome; MGD meibomian gland dysfunction; ATs artificial tears; PFAT's preservative free artificial tears; [place] nuclear sclerotic cataract; PSC posterior subcapsular cataract; ERM epi-retinal membrane; PVD posterior vitreous detachment; RD retinal detachment; DM diabetes mellitus; DR diabetic retinopathy; NPDR non-proliferative diabetic  retinopathy; PDR proliferative diabetic retinopathy; CSME clinically significant macular edema; DME diabetic macular edema; dbh dot blot hemorrhages; CWS cotton wool spot; POAG primary open angle glaucoma; C/D cup-to-disc ratio; HVF humphrey visual field; GVF goldmann visual field; OCT optical coherence tomography; IOP intraocular pressure; BRVO Branch retinal vein occlusion; CRVO central retinal vein occlusion; CRAO central retinal artery occlusion; BRAO branch retinal artery occlusion; RT retinal tear; SB scleral buckle; PPV pars plana vitrectomy; VH Vitreous hemorrhage; PRP panretinal laser photocoagulation; IVK intravitreal kenalog; VMT vitreomacular traction; MH Macular hole;  NVD neovascularization of the disc; NVE neovascularization elsewhere; AREDS age related eye disease study; ARMD age related macular degeneration; POAG primary open angle glaucoma; EBMD epithelial/anterior basement membrane dystrophy; ACIOL anterior chamber intraocular lens; IOL intraocular lens; PCIOL posterior chamber intraocular lens; Phaco/IOL phacoemulsification with intraocular lens placement; [place] photorefractive keratectomy; LASIK laser assisted in situ keratomileusis; HTN hypertension; DM diabetes mellitus; COPD chronic obstructive pulmonary disease

## 2018-08-22 ENCOUNTER — Telehealth: Payer: Self-pay

## 2018-08-22 NOTE — Telephone Encounter (Signed)
Pt called to report BP readings. She stated she started a new BP medication at last OV and it worked well for about two weeks but now it is rising again. Pt also stated that her Metformin ER was recalled and pharmacy said asked if this could be switched to a combo medication.  Taking BP meds around 9am, some readings she has not waited two hours after medications to take BP. Pt has had a couple readings at night that were high.   08/22/2018 2pm 159/94 90P 08/19/2018 10am 140/82 93P 08/18/2018 10am 150/98 96P 0620/2020  12pm   159/90 90P  Please advise

## 2018-08-23 ENCOUNTER — Ambulatory Visit (INDEPENDENT_AMBULATORY_CARE_PROVIDER_SITE_OTHER): Payer: BLUE CROSS/BLUE SHIELD | Admitting: Ophthalmology

## 2018-08-23 ENCOUNTER — Encounter (INDEPENDENT_AMBULATORY_CARE_PROVIDER_SITE_OTHER): Payer: Self-pay | Admitting: Ophthalmology

## 2018-08-23 ENCOUNTER — Other Ambulatory Visit: Payer: Self-pay

## 2018-08-23 DIAGNOSIS — H44001 Unspecified purulent endophthalmitis, right eye: Secondary | ICD-10-CM

## 2018-08-23 DIAGNOSIS — E113513 Type 2 diabetes mellitus with proliferative diabetic retinopathy with macular edema, bilateral: Secondary | ICD-10-CM

## 2018-08-23 DIAGNOSIS — H3581 Retinal edema: Secondary | ICD-10-CM | POA: Diagnosis not present

## 2018-08-23 DIAGNOSIS — H4312 Vitreous hemorrhage, left eye: Secondary | ICD-10-CM | POA: Diagnosis not present

## 2018-08-23 DIAGNOSIS — H25813 Combined forms of age-related cataract, bilateral: Secondary | ICD-10-CM

## 2018-08-23 DIAGNOSIS — I10 Essential (primary) hypertension: Secondary | ICD-10-CM

## 2018-08-23 DIAGNOSIS — H4311 Vitreous hemorrhage, right eye: Secondary | ICD-10-CM

## 2018-08-23 DIAGNOSIS — H35033 Hypertensive retinopathy, bilateral: Secondary | ICD-10-CM

## 2018-08-23 MED ORDER — BEVACIZUMAB CHEMO INJECTION 1.25MG/0.05ML SYRINGE FOR KALEIDOSCOPE
1.2500 mg | INTRAVITREAL | Status: AC | PRN
  Administered 2018-08-23: 1.25 mg via INTRAVITREAL

## 2018-08-25 ENCOUNTER — Other Ambulatory Visit: Payer: Self-pay | Admitting: Family Medicine

## 2018-08-27 ENCOUNTER — Other Ambulatory Visit: Payer: Self-pay

## 2018-08-27 ENCOUNTER — Ambulatory Visit (INDEPENDENT_AMBULATORY_CARE_PROVIDER_SITE_OTHER): Payer: BLUE CROSS/BLUE SHIELD | Admitting: Family Medicine

## 2018-08-27 DIAGNOSIS — I10 Essential (primary) hypertension: Secondary | ICD-10-CM | POA: Diagnosis not present

## 2018-08-27 DIAGNOSIS — E11319 Type 2 diabetes mellitus with unspecified diabetic retinopathy without macular edema: Secondary | ICD-10-CM

## 2018-08-27 DIAGNOSIS — E7800 Pure hypercholesterolemia, unspecified: Secondary | ICD-10-CM

## 2018-08-27 MED ORDER — METOPROLOL SUCCINATE ER 100 MG PO TB24: 100.0000 mg | ORAL_TABLET | Freq: Every day | ORAL | 3 refills | Status: DC

## 2018-08-27 MED ORDER — SITAGLIPTIN PHOS-METFORMIN HCL 50-1000 MG PO TABS: 1.0000 | ORAL_TABLET | Freq: Two times a day (BID) | ORAL | 5 refills | Status: DC

## 2018-08-27 NOTE — Telephone Encounter (Signed)
Patient advised and voiced understanding.  

## 2018-08-27 NOTE — Addendum Note (Signed)
Addended by: Ralph Dowdy on: 08/27/2018 09:53 AM   Modules accepted: Orders

## 2018-08-27 NOTE — Telephone Encounter (Signed)
OK, increase most recent bp med (metoprolol) to TWO of the 50mg  tabs once a day.  I sent in rx for 100mg  tabs to take 1 per day when she finishes her 50 mg tabs.  Also, I sent in rx for janumet (combination of januvia and metformin-->not the extended release formula. She will take 1 tab twice daily.  -thx

## 2018-08-27 NOTE — Addendum Note (Signed)
Addended by: Tammi Sou on: 08/27/2018 12:53 PM   Modules accepted: Orders

## 2018-08-28 ENCOUNTER — Telehealth: Payer: Self-pay

## 2018-08-28 LAB — LIPID PANEL
Cholesterol: 184 mg/dL (ref <200)
HDL: 44 mg/dL — ABNORMAL LOW (ref >=50)
LDL Cholesterol (Calc): 107 mg/dL (calc) — ABNORMAL HIGH
Non-HDL Cholesterol (Calc): 140 mg/dL (calc) — ABNORMAL HIGH (ref <130)
Total CHOL/HDL Ratio: 4.2 (calc) (ref <5.0)
Triglycerides: 209 mg/dL — ABNORMAL HIGH (ref <150)

## 2018-08-28 LAB — BASIC METABOLIC PANEL
BUN: 19 mg/dL (ref 7–25)
CO2: 18 mmol/L — ABNORMAL LOW (ref 20–32)
Calcium: 9.1 mg/dL (ref 8.6–10.2)
Chloride: 107 mmol/L (ref 98–110)
Creat: 0.6 mg/dL (ref 0.50–1.10)
Glucose, Bld: 120 mg/dL — ABNORMAL HIGH (ref 65–99)
Potassium: 4.5 mmol/L (ref 3.5–5.3)
Sodium: 137 mmol/L (ref 135–146)

## 2018-08-28 LAB — HEMOGLOBIN A1C
Hgb A1c MFr Bld: 8.6 % of total Hgb — ABNORMAL HIGH (ref <5.7)
Mean Plasma Glucose: 200 (calc)
eAG (mmol/L): 11.1 (calc)

## 2018-08-28 NOTE — Telephone Encounter (Signed)
Yes, she still needs to take the invokana.-thx

## 2018-08-28 NOTE — Telephone Encounter (Signed)
Pt called last week regarding increasing BP after starting new medication, Toprol xl 50mg . Received voicemail regarding Invokana. She wanted to know if she still needs to be taking this since starting  janumet 50-1000mg  BID. Last OV was 07/10/18.  Please advise, thanks.

## 2018-08-29 NOTE — Telephone Encounter (Signed)
Pt was notified.  

## 2018-09-03 ENCOUNTER — Encounter: Payer: Self-pay | Admitting: Family Medicine

## 2018-09-03 ENCOUNTER — Other Ambulatory Visit: Payer: Self-pay

## 2018-09-03 MED ORDER — ROSUVASTATIN CALCIUM 10 MG PO TABS: 10.0000 mg | ORAL_TABLET | Freq: Every day | ORAL | 4 refills | Status: DC

## 2018-09-09 NOTE — Progress Notes (Signed)
[facility] Note  09/10/2018     CHIEF COMPLAINT Patient presents for Retina Follow Up   HISTORY OF PRESENT ILLNESS: Gloria Gomez is a 48 y.o. female who presents to the clinic today for:   HPI    Retina Follow Up    In both eyes.  Since onset it is gradually improving.  I, the attending physician,  performed the HPI with the patient and updated documentation appropriately.          Comments    Patient states her vision is improving in her left eye and thinks things are coming along pretty well.  Patient denies any eye pain or discomfort and denies any new or worsening floaters or fol OU.       Last edited by Bernarda Caffey, MD on 09/11/2018  8:55 PM. (History)    pt states she feels like her vision is clearing a little bit, she states she is keeping her head elevated, she states her dr increased her blood pressure medication and added janumet instead of metformin  Referring physician: Tammi Sou, MD [address] [place],  [place] [address]  HISTORICAL INFORMATION:   Selected notes from the MEDICAL RECORD NUMBER Referred for DM exam LEE:  Ocular Hx-NPDR PMH-DM (takes invokana and metformin), high cholesterol, benign brain tumor, white coat HTN    CURRENT MEDICATIONS: Current Outpatient Medications (Ophthalmic Drugs)  Medication Sig  . brimonidine (ALPHAGAN) 0.15 % ophthalmic solution Place 1 drop into the right eye 2 (two) times daily. (Patient taking differently: Place 1 drop into the right eye 2 (two) times daily. Pt applies 1 drop in right eye.)  . Difluprednate (DUREZOL) 0.05 % EMUL Place 1 drop into the right eye 4 (four) times daily.  . prednisoLONE acetate (PRED FORTE) 1 % ophthalmic suspension Place 1 drop into the right eye every hour while awake. (Patient not taking: Reported on 06/11/2018)   No current facility-administered medications for this visit.  (Ophthalmic Drugs)   Current Outpatient Medications (Other)   Medication Sig  . glipiZIDE (GLUCOTROL XL) 10 MG 24 hr tablet TAKE 1 TABLET BY MOUTH WITH BREAKFAST  . HYDROcodone-acetaminophen (NORCO/VICODIN) 5-325 MG tablet Take 1-2 tablets by mouth every 6 (six) hours as needed for moderate pain.  . INVOKANA 300 MG TABS tablet TAKE 1 TABLET BY MOUTH ONCE DAILY BEFORE  BREAKFAST  . losartan (COZAAR) 100 MG tablet Take 1 tablet (100 mg total) by mouth daily.  . metFORMIN (GLUCOPHAGE-XR) 500 MG 24 hr tablet Take 2 tablets (1,000 mg total) by mouth 2 (two) times daily.  . metoprolol succinate (TOPROL-XL) 100 MG 24 hr tablet Take 1 tablet (100 mg total) by mouth daily. Take with or immediately following a meal.  . pantoprazole (PROTONIX) 40 MG tablet Take 1 tablet by mouth once daily  . promethazine (PHENERGAN) 12.5 MG tablet 1-2 tabs po q6h prn nausea  . rosuvastatin (CRESTOR) 10 MG tablet Take 1 tablet (10 mg total) by mouth daily.  . sitaGLIPtin-metformin (JANUMET) 50-1000 MG tablet Take 1 tablet by mouth 2 (two) times daily with a meal.  . temazepam (RESTORIL) 15 MG capsule Take 1 capsule (15 mg total) by mouth at bedtime as needed for sleep.   No current facility-administered medications for this visit.  (Other)      REVIEW OF SYSTEMS: ROS    Positive for: Endocrine, Eyes   Negative for: Constitutional, Gastrointestinal, Neurological, Skin, Genitourinary, Musculoskeletal, HENT, Cardiovascular, Respiratory, Psychiatric, Allergic/Imm, Heme/Lymph  Last edited by [name] on 09/10/2018  1:50 PM. (History)       ALLERGIES Allergies  Allergen Reactions  . Gluten Meal Swelling  . Lisinopril Cough  . Pioglitazone Other (See Comments)    ELEVATED glucoses + worse chronic nausea    PAST MEDICAL HISTORY Past Medical History:  Diagnosis Date  . Abdominal bloating    likely from diab gastroparesis.  Dr. Havery Moros to do EGD as of 10/2017 GI eval.  . Benign brain tumor ([place])    Cystic lesion in cerebral aqueduct region with mild  hydrocephalus-- stable MRI 02/2016.  Surveillance MRI 05/2017 --dilated cerebral aqueduct related to aqueductal stenosis and subsequent mild hydrocephalus (due to the 11 mm stable cystic lesion in cerebral aqueduct---?congenitial?.  . Cataract    OU  . Dysmenorrhea    vicodin occ during first 2 days of cycle.  . Gluten intolerance    pt reports she underwent full GI w/u to r/o celiac dz  . Hepatic steatosis    ultrasound 08/2017  . Hyperlipidemia, mixed   . Hypertensive retinopathy    OU  . Hypertensive retinopathy of both eyes   . Proliferative diabetic retinopathy of both eyes ([place])    steroid injections 10/2017--improved  . Sensorineural hearing loss of left ear    Sudden left hearing loss summer 2016--no improvement with steroids 01/2015 so brain MRI done by Dr. Redmond Baseman and it showed brain tumor that was determined to be benign.  Pt's hearing not bad enough for hearing aid as of 06/2016.  . Type 2 diabetes with complication ([place])    diab retpthy, diabetic gastroparesis (gastric emptying study mildly abnl 03/2017).  Recommended lantus 08/2018 but pt declined.  . White coat hypertension    Past Surgical History:  Procedure Laterality Date  . CHOLECYSTECTOMY  2000  . EYE SURGERY    . GASTRIC EMPTYING SCAN  04/20/2017   Mildly abnormal, particularly the 1st hour of emptying.  Marland Kitchen PARS PLANA VITRECTOMY Right 04/12/2018   Procedure: Right PARS PLANA VITRECTOMY WITH 25 GAUGE with intravitreal antibiotics;  Surgeon: Bernarda Caffey, MD;  Location: [facility];  Service: Ophthalmology;  Laterality: Right;    FAMILY HISTORY Family History  Problem Relation Age of Onset  . Brain cancer Mother   . Diabetes Father   . Diabetes Maternal Grandmother   . Cataracts Maternal Grandmother   . Cervical cancer Paternal Grandmother   . Colon cancer Maternal Grandfather 48  . Amblyopia Neg Hx   . Blindness Neg Hx   . Glaucoma Neg Hx   . Macular degeneration Neg Hx   . Retinal detachment Neg Hx   . Strabismus  Neg Hx   . Retinitis pigmentosa Neg Hx     SOCIAL HISTORY Social History   Tobacco Use  . Smoking status: Never Smoker  . Smokeless tobacco: Never Used  Substance Use Topics  . Alcohol use: No  . Drug use: No         OPHTHALMIC EXAM:  Base Eye Exam    Visual Acuity (Snellen - Linear)      Right Left   Dist cc 20/40 20/40 -1   Dist ph cc 20/25 -2 20/25 -1   Correction: Glasses       Tonometry (Tonopen, 1:53 PM)      Right Left   Pressure 30 29  1  gtt of Cosopt OU given at 1:56       Pupils      Dark Light Shape React APD  Right 4 3 Round Brisk 0   Left 4 3 Round Brisk 0       Extraocular Movement      Right Left    Full Full       Neuro/Psych    Oriented x3: Yes   Mood/Affect: Normal       Dilation    Both eyes: 1.0% Mydriacyl, 2.5% Phenylephrine @ 1:56 PM        Slit Lamp and Fundus Exam    Slit Lamp Exam      Right Left   Lids/Lashes Dermatochalasis - upper lid, Meibomian gland dysfunction, Telangiectasia Dermatochalasis - upper lid, Meibomian gland dysfunction, Telangiectasia   Conjunctiva/Sclera White and quiet White and quiet   Cornea Clear, mild endopigment inferior, 1+ Punctate epithelial erosions 1+ Punctate epithelial erosions   Anterior Chamber Clear, no Cell/pigment, no hypopyon  Deep and quiet   Iris Round and dilated, No NVI Round and dilated, No NVI   Lens 2+ Nuclear sclerosis, 2+ Cortical cataract, 1+Posterior subcapsular cataract 2+ Nuclear sclerosis, 2+ Cortical cataract   Vitreous post vitrectomy; improving cell, scattered vitreous debris - clearing and settling inferiorly, vitreous condensations - improving Vitreous syneresis, diffuse hemorrhage clearing centrally and settling inferiorly       Fundus Exam      Right Left   Disc Pink and Sharp, mild fibrosis superiorly with fine NVD and traction extending superiorly and along ST arcade - maturing Pink and sharp   C/D Ratio 0.1 0.1   Macula Flat, Blunted foveal reflex, scattered  MA and exudates Flat, blunted foveal reflex, clusted of scattered MA/exudate greatest ST macula   Vessels mild Vascular attenuation, tractional fibrosis along ST arcades Vascular attenuation, mild Tortuousity   Periphery Attached, scattered DBH, 360 PRP  Attached, inferior retina obscured by VH; scattered DBH, good 360 PRP laser changes        Refraction    Wearing Rx      Sphere Cylinder   Right -3.75 Sphere   Left -3.75 Sphere          IMAGING AND PROCEDURES  Imaging and Procedures for @TODAY @  OCT, Retina - OU - Both Eyes       Right Eye Quality was good. Central Foveal Thickness: 320. Progression has been stable. Findings include abnormal foveal contour, epiretinal membrane, no SRF, intraretinal hyper-reflective material, vitreous traction, preretinal fibrosis (persistent IRF/vitreous traction).   Left Eye Quality was good. Central Foveal Thickness: 293. Progression has improved. Findings include intraretinal fluid, no SRF, normal foveal contour, intraretinal hyper-reflective material, epiretinal membrane (interval improvement in vitreous opacities; mild interval improvement in IRF/cystic changes).   Notes *Images captured and stored on drive  Diagnosis / Impression:  OD: persistent DME --  persistent IRF/vitreous traction OS: NFP; +IRF; interval improvement in vitreous opacities; mild interval improvement in IRF/cystic changes  Clinical management:  See below  Abbreviations: NFP - Normal foveal profile. CME - cystoid macular edema. PED - pigment epithelial detachment. IRF - intraretinal fluid. SRF - subretinal fluid. EZ - ellipsoid zone. ERM - epiretinal membrane. ORA - outer retinal atrophy. ORT - outer retinal tubulation. SRHM - subretinal hyper-reflective material                 ASSESSMENT/PLAN:    ICD-10-CM   1. Proliferative diabetic retinopathy of both eyes with macular edema associated with type 2 diabetes mellitus ([place])  Q30.0923   2. Retinal edema   H35.81 OCT, Retina - OU - Both Eyes  3. Vitreous hemorrhage  of left eye ([place])  H43.12   4. Right endophthalmia  H44.001   5. Vitreous hemorrhage, right eye ([place])  H43.11   6. Essential hypertension  I10   7. Hypertensive retinopathy of both eyes  H35.033   8. Combined forms of age-related cataract of both eyes  H25.813   9. Vitreous hemorrhage of right eye ([facility])  H43.11     1,2.  Proliferative diabetic retinopathy w/ DME, OU  - s/p IVA OD #1 9.20.19, #2 (10.25.19), #3 (11.15.19), #4 (12.17.19), #5 (01.14.20), #6 (2.11.20), #7 (05.29.20)  - s/p IVA OS #1 9.27.19, #2 (10.25.19), #3 (11.15.19), #4 (12.17.19), #5 (01.14.20), #6 (2.11.20), #7 (04.26.20), #8 (05.29.20), #9 (06.26.20)  - S/P PRP OS (09.20.19), (5.19.20)  - S/P PRP OD (9.27.19 and 11.21.19), fill-in (04.14.20)  - OD doing well -- VA stable at 20/30, but there is some preretinal fibrosis / tractional membranes just superior to disc and mild central DME  - discussed OD may need PPV and membrane peel if fibrosis and traction worsen  - HbA1c 8.6% (06.30.20) from 8.4 (02.14.20), but pt reports better control of BG with recent medication adjustments -- added Janumet  - FA (9.20.19) shows +NVE OU and leaking MA and capillary nonperfusion  - repeat FA 11.15.19 shows NV regressing OU  - exam shows improving VH OS -- OCT with minimal DME OS  - f/u in 2 wks for DFE/OCT/VH f/u  3. Vitreous hemorrhage OS  - new onset 4.24.20  - s/p IVA OS #7 on 4.26.20  - today, VH improved  - pt reports recent change in BP/DM meds   - BCVA improved to 20/25 from 20/30 OS  - etiology: secondary to PDR as described below (no RT/RD on exam)  - s/p multiple IVA OS for DME (see above)  - s/p PRP OS (9.20.19), (05.19.20)  - 2 wks for VH f/u  4. Endophthalmitis OD  - s/p IVA OU 04/09/2018  - s/p 25g PPV w/ intravitreal vanc, ceftaz and cefepime OD, 2.14.2020  - s/p intravitreal tap / vanc and ceflaz injections (02.16.20)             - gram stain (2.14.20)  shows G+ cocci, WBCs mostly PMNs;   - repeat gram stain from t/i (2.16.20) -- no organisms, just WBCs             - cultures from vitreous grew rare Staph warneri; cultures from t/i -- no growth             - doing well, BCVA 20/30             - inflammation/posterior debris essentially resolved now  - corneal edema resolved             - IOP 30 today, but improved to 18 after 1 round of Cosopt             - using durezol qdaily OU -- okay to stop  - cont Brimonidine BID OD -- may need to add additional drop if IOP remains elevated  - PSO PRN  - completed po pred taper -- caused significant elevations in BG  - monitor  5. History of Vitreous Hemorrhage OD -- cleared from PPV for endophthalmitis  - secondary to PDR as described below  - S/P IVA OD #1 (09.20.19), #2 (10.25.19), #3 (11.15.19), #4 (12.16.19), #5 (03/12/2018)  - S/P PRP OD #1 (09.27.19), fill in OD (11.21.19) -- each somewhat limited inferiorly by residual VH  5,6. Hypertensive retinopathy  OU  - discussed importance of tight BP control.  - monitor for now  7. Combined form age related cataract OU-   - The symptoms of cataract, surgical options, and treatments and risks were discussed with patient.  - discussed diagnosis and progression  - not yet visually significant  - monitor for now   Ophthalmic Meds Ordered this visit:  No orders of the defined types were placed in this encounter.      Return in about 2 weeks (around 09/24/2018) for f/u PDR OU, DFE, OCT.  There are no Patient Instructions on file for this visit.   Explained the diagnoses, plan, and follow up with the patient and they expressed understanding.  Patient expressed understanding of the importance of proper follow up care.   This document serves as a record of services personally performed by Gardiner Sleeper, MD, PhD. It was created on their behalf by Ernest Mallick, OA, an ophthalmic assistant. The creation of this record is the provider's dictation  and/or activities during the visit.    Electronically signed by: Ernest Mallick, OA  07.13.2020 8:57 PM .    Gardiner Sleeper, M.D., Ph.D. Diseases & Surgery of the Retina and Vitreous [facility]  I have reviewed the above documentation for accuracy and completeness, and I agree with the above. Gardiner Sleeper, M.D., Ph.D. 09/11/18 9:04 PM    Abbreviations: M myopia (nearsighted); A astigmatism; H hyperopia (farsighted); P presbyopia; Mrx spectacle prescription;  CTL contact lenses; OD right eye; OS left eye; OU both eyes  XT exotropia; ET esotropia; PEK punctate epithelial keratitis; PEE punctate epithelial erosions; DES dry eye syndrome; MGD meibomian gland dysfunction; ATs artificial tears; PFAT's preservative free artificial tears; [place] nuclear sclerotic cataract; PSC posterior subcapsular cataract; ERM epi-retinal membrane; PVD posterior vitreous detachment; RD retinal detachment; DM diabetes mellitus; DR diabetic retinopathy; NPDR non-proliferative diabetic retinopathy; PDR proliferative diabetic retinopathy; CSME clinically significant macular edema; DME diabetic macular edema; dbh dot blot hemorrhages; CWS cotton wool spot; POAG primary open angle glaucoma; C/D cup-to-disc ratio; HVF humphrey visual field; GVF goldmann visual field; OCT optical coherence tomography; IOP intraocular pressure; BRVO Branch retinal vein occlusion; CRVO central retinal vein occlusion; CRAO central retinal artery occlusion; BRAO branch retinal artery occlusion; RT retinal tear; SB scleral buckle; PPV pars plana vitrectomy; VH Vitreous hemorrhage; PRP panretinal laser photocoagulation; IVK intravitreal kenalog; VMT vitreomacular traction; MH Macular hole;  NVD neovascularization of the disc; NVE neovascularization elsewhere; AREDS age related eye disease study; ARMD age related macular degeneration; POAG primary open angle glaucoma; EBMD epithelial/anterior basement membrane dystrophy; ACIOL  anterior chamber intraocular lens; IOL intraocular lens; PCIOL posterior chamber intraocular lens; Phaco/IOL phacoemulsification with intraocular lens placement; [place] photorefractive keratectomy; LASIK laser assisted in situ keratomileusis; HTN hypertension; DM diabetes mellitus; COPD chronic obstructive pulmonary disease

## 2018-09-10 ENCOUNTER — Other Ambulatory Visit: Payer: Self-pay

## 2018-09-10 ENCOUNTER — Encounter (INDEPENDENT_AMBULATORY_CARE_PROVIDER_SITE_OTHER): Payer: Self-pay | Admitting: Ophthalmology

## 2018-09-10 ENCOUNTER — Ambulatory Visit (INDEPENDENT_AMBULATORY_CARE_PROVIDER_SITE_OTHER): Payer: BLUE CROSS/BLUE SHIELD | Admitting: Ophthalmology

## 2018-09-10 DIAGNOSIS — I10 Essential (primary) hypertension: Secondary | ICD-10-CM

## 2018-09-10 DIAGNOSIS — E113513 Type 2 diabetes mellitus with proliferative diabetic retinopathy with macular edema, bilateral: Secondary | ICD-10-CM | POA: Diagnosis not present

## 2018-09-10 DIAGNOSIS — H4312 Vitreous hemorrhage, left eye: Secondary | ICD-10-CM | POA: Diagnosis not present

## 2018-09-10 DIAGNOSIS — H3581 Retinal edema: Secondary | ICD-10-CM | POA: Diagnosis not present

## 2018-09-10 DIAGNOSIS — H44001 Unspecified purulent endophthalmitis, right eye: Secondary | ICD-10-CM | POA: Diagnosis not present

## 2018-09-10 DIAGNOSIS — H4311 Vitreous hemorrhage, right eye: Secondary | ICD-10-CM

## 2018-09-10 DIAGNOSIS — H35033 Hypertensive retinopathy, bilateral: Secondary | ICD-10-CM

## 2018-09-10 DIAGNOSIS — H25813 Combined forms of age-related cataract, bilateral: Secondary | ICD-10-CM

## 2018-09-18 ENCOUNTER — Other Ambulatory Visit: Payer: Self-pay | Admitting: Family Medicine

## 2018-09-19 ENCOUNTER — Other Ambulatory Visit: Payer: Self-pay | Admitting: Family Medicine

## 2018-09-19 ENCOUNTER — Other Ambulatory Visit: Payer: Self-pay

## 2018-09-27 ENCOUNTER — Encounter (INDEPENDENT_AMBULATORY_CARE_PROVIDER_SITE_OTHER): Payer: BLUE CROSS/BLUE SHIELD | Admitting: Ophthalmology

## 2018-10-01 NOTE — Progress Notes (Signed)
[facility] Note  10/02/2018     CHIEF COMPLAINT Patient presents for Retina Follow Up   HISTORY OF PRESENT ILLNESS: Gloria Gomez is a 48 y.o. female who presents to the clinic today for:   HPI    Retina Follow Up    Patient presents with  Diabetic Retinopathy.  In both eyes.  This started months ago.  Severity is moderate.  Duration of 3 weeks.  Since onset it is stable.  I, the attending physician,  performed the HPI with the patient and updated documentation appropriately.          Comments    48 y/o female pt here for 3 wk f/u for PDR w/mac edema OU.  No change in [place] OU.  Denies pain, flashes, floaters.  Brimonidine BID OU.       Last edited by Bernarda Caffey, MD on 10/02/2018  8:36 PM. (History)    pt states she can tell the blood is clearing and she is not seeing any more floaters, she states she has noticed this for the past few weeks   Referring physician: Tammi Sou, MD [address] [place],  [place] [address]  HISTORICAL INFORMATION:   Selected notes from the MEDICAL RECORD NUMBER Referred for DM exam LEE:  Ocular Hx-NPDR PMH-DM (takes invokana and metformin), high cholesterol, benign brain tumor, white coat HTN    CURRENT MEDICATIONS: Current Outpatient Medications (Ophthalmic Drugs)  Medication Sig  . brimonidine (ALPHAGAN) 0.15 % ophthalmic solution Place 1 drop into the right eye 2 (two) times daily. (Patient taking differently: Place 1 drop into the right eye 2 (two) times daily. Pt applies 1 drop in right eye.)  . Difluprednate (DUREZOL) 0.05 % EMUL Place 1 drop into the right eye 4 (four) times daily. (Patient not taking: Reported on 10/02/2018)  . prednisoLONE acetate (PRED FORTE) 1 % ophthalmic suspension Place 1 drop into the right eye every hour while awake. (Patient not taking: Reported on 10/02/2018)   No current facility-administered medications for this visit.  (Ophthalmic Drugs)   Current Outpatient  Medications (Other)  Medication Sig  . glipiZIDE (GLUCOTROL XL) 10 MG 24 hr tablet TAKE 1 TABLET BY MOUTH WITH BREAKFAST  . HYDROcodone-acetaminophen (NORCO/VICODIN) 5-325 MG tablet Take 1-2 tablets by mouth every 6 (six) hours as needed for moderate pain.  . INVOKANA 300 MG TABS tablet TAKE 1 TABLET BY MOUTH ONCE DAILY BEFORE BREAKFAST  . losartan (COZAAR) 100 MG tablet Take 1 tablet (100 mg total) by mouth daily.  . metFORMIN (GLUCOPHAGE-XR) 500 MG 24 hr tablet Take 2 tablets (1,000 mg total) by mouth 2 (two) times daily.  . metoprolol succinate (TOPROL-XL) 100 MG 24 hr tablet Take 1 tablet (100 mg total) by mouth daily. Take with or immediately following a meal.  . pantoprazole (PROTONIX) 40 MG tablet Take 1 tablet by mouth once daily  . promethazine (PHENERGAN) 12.5 MG tablet 1-2 tabs po q6h prn nausea  . rosuvastatin (CRESTOR) 10 MG tablet Take 1 tablet (10 mg total) by mouth daily.  . sitaGLIPtin-metformin (JANUMET) 50-1000 MG tablet Take 1 tablet by mouth 2 (two) times daily with a meal.  . temazepam (RESTORIL) 15 MG capsule Take 1 capsule (15 mg total) by mouth at bedtime as needed for sleep.   No current facility-administered medications for this visit.  (Other)      REVIEW OF SYSTEMS: ROS    Positive for: Endocrine, Eyes   Negative for:  Constitutional, Gastrointestinal, Neurological, Skin, Genitourinary, Musculoskeletal, HENT, Cardiovascular, Respiratory, Psychiatric, Allergic/Imm, Heme/Lymph   Last edited by Matthew Folks, COA on 10/02/2018  3:44 PM. (History)       ALLERGIES Allergies  Allergen Reactions  . Gluten Meal Swelling  . Lisinopril Cough  . Pioglitazone Other (See Comments)    ELEVATED glucoses + worse chronic nausea    PAST MEDICAL HISTORY Past Medical History:  Diagnosis Date  . Abdominal bloating    likely from diab gastroparesis.  Dr. Havery Moros to do EGD as of 10/2017 GI eval.  . Benign brain tumor ([place])    Cystic lesion in cerebral aqueduct  region with mild hydrocephalus-- stable MRI 02/2016.  Surveillance MRI 05/2017 --dilated cerebral aqueduct related to aqueductal stenosis and subsequent mild hydrocephalus (due to the 11 mm stable cystic lesion in cerebral aqueduct---?congenitial?.  . Cataract    OU  . Dysmenorrhea    vicodin occ during first 2 days of cycle.  . Gluten intolerance    pt reports she underwent full GI w/u to r/o celiac dz  . Hepatic steatosis    ultrasound 08/2017  . Hyperlipidemia, mixed   . Hypertensive retinopathy    OU  . Hypertensive retinopathy of both eyes   . Proliferative diabetic retinopathy of both eyes ([place])    steroid injections 10/2017--improved  . Sensorineural hearing loss of left ear    Sudden left hearing loss summer 2016--no improvement with steroids 01/2015 so brain MRI done by Dr. Redmond Baseman and it showed brain tumor that was determined to be benign.  Pt's hearing not bad enough for hearing aid as of 06/2016.  . Type 2 diabetes with complication ([place])    diab retpthy, diabetic gastroparesis (gastric emptying study mildly abnl 03/2017).  Recommended lantus 08/2018 but pt declined.  . White coat hypertension    Past Surgical History:  Procedure Laterality Date  . CHOLECYSTECTOMY  2000  . EYE SURGERY    . GASTRIC EMPTYING SCAN  04/20/2017   Mildly abnormal, particularly the 1st hour of emptying.  Marland Kitchen PARS PLANA VITRECTOMY Right 04/12/2018   Procedure: Right PARS PLANA VITRECTOMY WITH 25 GAUGE with intravitreal antibiotics;  Surgeon: Bernarda Caffey, MD;  Location: [place];  Service: Ophthalmology;  Laterality: Right;    FAMILY HISTORY Family History  Problem Relation Age of Onset  . Brain cancer Mother   . Diabetes Father   . Diabetes Maternal Grandmother   . Cataracts Maternal Grandmother   . Cervical cancer Paternal Grandmother   . Colon cancer Maternal Grandfather 77  . Amblyopia Neg Hx   . Blindness Neg Hx   . Glaucoma Neg Hx   . Macular degeneration Neg Hx   . Retinal detachment Neg Hx    . Strabismus Neg Hx   . Retinitis pigmentosa Neg Hx     SOCIAL HISTORY Social History   Tobacco Use  . Smoking status: Never Smoker  . Smokeless tobacco: Never Used  Substance Use Topics  . Alcohol use: No  . Drug use: No         OPHTHALMIC EXAM:  Base Eye Exam    Visual Acuity (Snellen - Linear)      Right Left   Dist cc 20/40 20/40   Dist ph cc 20/25 20/25   Correction: Glasses       Tonometry (Tonopen, 3:46 PM)      Right Left   Pressure 17 15       Pupils      Dark Light  Shape React APD   Right 4 3 Round Brisk None   Left 4 3 Round Brisk None       Visual Fields (Counting fingers)      Left Right    Full Full       Extraocular Movement      Right Left    Full, Ortho Full, Ortho       Neuro/Psych    Oriented x3: Yes   Mood/Affect: Normal       Dilation    Both eyes: 1.0% Mydriacyl, 2.5% Phenylephrine @ 3:46 PM        Slit Lamp and Fundus Exam    Slit Lamp Exam      Right Left   Lids/Lashes Dermatochalasis - upper lid, Meibomian gland dysfunction, Telangiectasia Dermatochalasis - upper lid, Meibomian gland dysfunction, Telangiectasia   Conjunctiva/Sclera White and quiet White and quiet   Cornea Clear, trace Punctate epithelial erosions Trace Punctate epithelial erosions   Anterior Chamber Clear, no Cell/pigment, no hypopyon  Deep and quiet   Iris Round and dilated, No NVI Round and dilated, No NVI   Lens 2+ Nuclear sclerosis, 2+ Cortical cataract, 1+Posterior subcapsular cataract 2+ Nuclear sclerosis, 2+ Cortical cataract   Vitreous post vitrectomy; vitreous condensations improved Vitreous syneresis, diffuse hemorrhage clearing centrally and settling inferiorly       Fundus Exam      Right Left   Disc Pink and Sharp, mild fibrosis superiorly with fine NVD and traction extending superiorly and along ST arcade - maturing Pink and sharp   C/D Ratio 0.1 0.1   Macula Flat, Blunted foveal reflex, mild punctate MA and exudates greatest superior  macula Flat, blunted foveal reflex, scattered MA and punctate exudate    Vessels mild Vascular attenuation, tractional fibrosis along proximal ST arcades Vascular attenuation, mild Tortuousity   Periphery Attached, tractional fibrosis SN quadrant, scattered DBH, 360 PRP -- room for fill in Attached, inferior retina partially obscured by VH; scattered DBH, good 360 PRP laser changes - room for fill in          IMAGING AND PROCEDURES  Imaging and Procedures for _0 @  OCT, Retina - OU - Both Eyes       Right Eye Quality was good. Central Foveal Thickness: 328. Progression has been stable. Findings include abnormal foveal contour, epiretinal membrane, no SRF, intraretinal hyper-reflective material, vitreous traction, preretinal fibrosis (persistent IRF/vitreous traction).   Left Eye Quality was good. Central Foveal Thickness: 291. Progression has improved. Findings include intraretinal fluid, no SRF, normal foveal contour, intraretinal hyper-reflective material, epiretinal membrane (interval improvement in vitreous opacities; mild interval increase in IRF/cystic changes).   Notes *Images captured and stored on drive  Diagnosis / Impression:  OD: persistent DME --  persistent IRF/vitreous traction OS: NFP; +IRF; interval improvement in vitreous opacities; mild interval increase in IRF/cystic changes   Clinical management:  See below  Abbreviations: NFP - Normal foveal profile. CME - cystoid macular edema. PED - pigment epithelial detachment. IRF - intraretinal fluid. SRF - subretinal fluid. EZ - ellipsoid zone. ERM - epiretinal membrane. ORA - outer retinal atrophy. ORT - outer retinal tubulation. SRHM - subretinal hyper-reflective material         Intravitreal Injection, Pharmacologic Agent - OS - Left Eye       Time Out 10/02/2018. 4:31 PM. Confirmed correct patient, procedure, site, and patient consented.   Anesthesia Topical anesthesia was used. Anesthetic medications  included Lidocaine 2%, Proparacaine 0.5%.   Procedure Preparation included 5%  betadine to ocular surface, eyelid speculum. A 30 gauge needle was used.   Injection:  1.25 mg Bevacizumab (AVASTIN) SOLN   NDC: 25053-976-73, Lot: [id number] , Expiration date: 11/28/2018   Route: Intravitreal, Site: Left Eye, Waste: 0 mL  Post-op Post injection exam found visual acuity of at least counting fingers. The patient tolerated the procedure well. There were no complications. The patient received written and verbal post procedure care education.                ASSESSMENT/PLAN:    ICD-10-CM   1. Proliferative diabetic retinopathy of both eyes with macular edema associated with type 2 diabetes mellitus (HCC)  Y81.4481 Intravitreal Injection, Pharmacologic Agent - OS - Left Eye    Bevacizumab (AVASTIN) SOLN 1.25 mg  2. Retinal edema  H35.81 OCT, Retina - OU - Both Eyes  3. Vitreous hemorrhage of left eye (HCC)  H43.12 Intravitreal Injection, Pharmacologic Agent - OS - Left Eye    Bevacizumab (AVASTIN) SOLN 1.25 mg  4. Right endophthalmia  H44.001   5. Vitreous hemorrhage, right eye ([place])  H43.11   6. Essential hypertension  I10   7. Hypertensive retinopathy of both eyes  H35.033   8. Combined forms of age-related cataract of both eyes  H25.813   9. Vitreous hemorrhage of right eye (HCC)  H43.11     1,2.  Proliferative diabetic retinopathy w/ DME, OU  - HbA1c 8.6% (06.30.20) from 8.4 (02.14.20), but pt reports better control of BG with recent medication adjustments -- added Janumet  - s/p IVA OD #1 9.20.19, #2 (10.25.19), #3 (11.15.19), #4 (12.17.19), #5 (01.14.20), #6 (2.11.20), #7 (05.29.20)  - s/p IVA OS #1 9.27.19, #2 (10.25.19), #3 (11.15.19), #4 (12.17.19), #5 (01.14.20), #6 (2.11.20), #7 (04.26.20), #8 (05.29.20), #9 (06.26.20)  - S/P PRP OS (09.20.19), (5.19.20)  - S/P PRP OD (9.27.19 and 11.21.19), fill-in (04.14.20)  - FA (9.20.19) shows +NVE OU and leaking MA and capillary  nonperfusion  - repeat FA 11.15.19 shows NV regressing OU  - exam shows improving VH OS -- OCT with mild DME OS  - recommend IVA OS #10 today, 08.05.20  - pt wishes to proceed  - RBA of procedure discussed, questions answered  - informed consent obtained and signed  - see procedure note  - OD w/ VA stable at 20/30, but there is some preretinal fibrosis / tractional membranes just superior to disc and mild central DME  - discussed OD will likely need PPV and membrane peel to remove fibrosis and traction -- pt wants to tentatively schedule surgery in early September (9.3.20)  - f/u in 2 wks for DFE/OCT/PRP OS  3. Vitreous hemorrhage OS  - new onset 4.24.20  - etiology: secondary to PDR as described below (no RT/RD on exam)  - s/p IVA OS on 4.26.20, 5.29.20, 6.26.20 and now 8.5.20  - today, VH improved with just mild residual VH settled inferiorly -- center clear  - pt reports recent change in BP/DM meds   - BCVA stable at 20/25   - s/p PRP OS (9.20.19), (05.19.20)  - 2 wks for [facility] f/u  4. Endophthalmitis OD  - s/p IVA OU 04/09/2018  - s/p 25g PPV w/ intravitreal vanc, ceftaz and cefepime OD, 2.14.2020  - s/p intravitreal tap / vanc and ceflaz injections (02.16.20)             - gram stain (2.14.20) shows G+ cocci, WBCs mostly PMNs;   - repeat gram stain from t/i (2.16.20) -- no  organisms, just WBCs             - cultures from vitreous grew rare Staph warneri; cultures from t/i -- no growth             - doing well, BCVA 20/30             - inflammation/posterior debris essentially resolved now  - corneal edema resolved             - IOP 17 - cont Brimonidine  - completed po pred taper -- caused significant elevations in BG  - monitor  5. History of Vitreous Hemorrhage OD -- cleared from PPV for endophthalmitis  - secondary to PDR as described below  - S/P IVA OD #1 (09.20.19), #2 (10.25.19), #3 (11.15.19), #4 (12.16.19), #5 (03/12/2018)  - S/P PRP OD #1 (09.27.19), fill in OD  (11.21.19) -- each somewhat limited inferiorly by residual VH  6,7. Hypertensive retinopathy OU  - discussed importance of tight BP control.  - monitor for now  8. Combined form age related cataract OU-   - The symptoms of cataract, surgical options, and treatments and risks were discussed with patient.  - discussed diagnosis and progression  - not yet visually significant  - monitor for now   Ophthalmic Meds Ordered this visit:  Meds ordered this encounter  Medications  . Bevacizumab (AVASTIN) SOLN 1.25 mg       Return in about 2 weeks (around 10/16/2018), or PRP OS.  There are no Patient Instructions on file for this visit.   Explained the diagnoses, plan, and follow up with the patient and they expressed understanding.  Patient expressed understanding of the importance of proper follow up care.   This document serves as a record of services personally performed by Gardiner Sleeper, MD, PhD. It was created on their behalf by Ernest Mallick, OA, an ophthalmic assistant. The creation of this record is the provider's dictation and/or activities during the visit.    Electronically signed by: Ernest Mallick, OA  08.04.2020 8:39 PM    Gardiner Sleeper, M.D., Ph.D. Diseases & Surgery of the Retina and Vitreous [facility]  I have reviewed the above documentation for accuracy and completeness, and I agree with the above. Gardiner Sleeper, M.D., Ph.D. 10/02/18 8:47 PM    Abbreviations: M myopia (nearsighted); A astigmatism; H hyperopia (farsighted); P presbyopia; Mrx spectacle prescription;  CTL contact lenses; OD right eye; OS left eye; OU both eyes  XT exotropia; ET esotropia; PEK punctate epithelial keratitis; PEE punctate epithelial erosions; DES dry eye syndrome; MGD meibomian gland dysfunction; ATs artificial tears; PFAT's preservative free artificial tears; [place] nuclear sclerotic cataract; PSC posterior subcapsular cataract; ERM epi-retinal membrane; PVD  posterior vitreous detachment; RD retinal detachment; DM diabetes mellitus; DR diabetic retinopathy; NPDR non-proliferative diabetic retinopathy; PDR proliferative diabetic retinopathy; CSME clinically significant macular edema; DME diabetic macular edema; dbh dot blot hemorrhages; CWS cotton wool spot; POAG primary open angle glaucoma; C/D cup-to-disc ratio; HVF humphrey visual field; GVF goldmann visual field; OCT optical coherence tomography; IOP intraocular pressure; BRVO Branch retinal vein occlusion; CRVO central retinal vein occlusion; CRAO central retinal artery occlusion; BRAO branch retinal artery occlusion; RT retinal tear; SB scleral buckle; PPV pars plana vitrectomy; VH Vitreous hemorrhage; PRP panretinal laser photocoagulation; IVK intravitreal kenalog; VMT vitreomacular traction; MH Macular hole;  NVD neovascularization of the disc; NVE neovascularization elsewhere; AREDS age related eye disease study; ARMD age related macular degeneration; POAG primary open  angle glaucoma; EBMD epithelial/anterior basement membrane dystrophy; ACIOL anterior chamber intraocular lens; IOL intraocular lens; PCIOL posterior chamber intraocular lens; Phaco/IOL phacoemulsification with intraocular lens placement; [place] photorefractive keratectomy; LASIK laser assisted in situ keratomileusis; HTN hypertension; DM diabetes mellitus; COPD chronic obstructive pulmonary disease

## 2018-10-02 ENCOUNTER — Encounter (INDEPENDENT_AMBULATORY_CARE_PROVIDER_SITE_OTHER): Payer: Self-pay | Admitting: Ophthalmology

## 2018-10-02 ENCOUNTER — Other Ambulatory Visit: Payer: Self-pay

## 2018-10-02 ENCOUNTER — Ambulatory Visit (INDEPENDENT_AMBULATORY_CARE_PROVIDER_SITE_OTHER): Payer: BLUE CROSS/BLUE SHIELD | Admitting: Ophthalmology

## 2018-10-02 DIAGNOSIS — H44001 Unspecified purulent endophthalmitis, right eye: Secondary | ICD-10-CM | POA: Diagnosis not present

## 2018-10-02 DIAGNOSIS — H4312 Vitreous hemorrhage, left eye: Secondary | ICD-10-CM

## 2018-10-02 DIAGNOSIS — H35033 Hypertensive retinopathy, bilateral: Secondary | ICD-10-CM

## 2018-10-02 DIAGNOSIS — H25813 Combined forms of age-related cataract, bilateral: Secondary | ICD-10-CM

## 2018-10-02 DIAGNOSIS — H3581 Retinal edema: Secondary | ICD-10-CM | POA: Diagnosis not present

## 2018-10-02 DIAGNOSIS — H4311 Vitreous hemorrhage, right eye: Secondary | ICD-10-CM

## 2018-10-02 DIAGNOSIS — E113513 Type 2 diabetes mellitus with proliferative diabetic retinopathy with macular edema, bilateral: Secondary | ICD-10-CM

## 2018-10-02 DIAGNOSIS — I10 Essential (primary) hypertension: Secondary | ICD-10-CM

## 2018-10-02 MED ORDER — BEVACIZUMAB CHEMO INJECTION 1.25MG/0.05ML SYRINGE FOR KALEIDOSCOPE
1.2500 mg | INTRAVITREAL | Status: AC | PRN
  Administered 2018-10-02: 1.25 mg via INTRAVITREAL

## 2018-10-05 ENCOUNTER — Other Ambulatory Visit: Payer: Self-pay | Admitting: Family Medicine

## 2018-10-15 NOTE — Progress Notes (Signed)
[facility] Note  10/16/2018     CHIEF COMPLAINT Patient presents for Retina Follow Up   HISTORY OF PRESENT ILLNESS: Gloria Gomez is a 48 y.o. female who presents to the clinic today for:   HPI    Retina Follow Up    Patient presents with  Diabetic Retinopathy.  In both eyes.  This started 3 months ago.  Since onset it is stable.  I, the attending physician,  performed the HPI with the patient and updated documentation appropriately.          Comments    F/U PDR with DME OU.Patient states her vision is "doing ok",denies new visual onsets/issues. Bs 109-119 past week. Bp 124/88 (10/16/18).       Last edited by Bernarda Caffey, MD on 10/16/2018 10:58 AM. (History)    pt states her vision is doing well, she states she is not seeing any new floaters  Referring physician: Tammi Sou, MD [address] [place],   [address]  HISTORICAL INFORMATION:   Selected notes from the MEDICAL RECORD NUMBER Referred for DM exam LEE:  Ocular Hx-NPDR PMH-DM (takes invokana and metformin), high cholesterol, benign brain tumor, white coat HTN    CURRENT MEDICATIONS: Current Outpatient Medications (Ophthalmic Drugs)  Medication Sig  . brimonidine (ALPHAGAN) 0.15 % ophthalmic solution Place 1 drop into the right eye 2 (two) times daily. (Patient taking differently: Place 1 drop into the right eye 2 (two) times daily. Pt applies 1 drop in right eye.)  . Difluprednate (DUREZOL) 0.05 % EMUL Place 1 drop into the right eye 4 (four) times daily.  . prednisoLONE acetate (PRED FORTE) 1 % ophthalmic suspension Place 1 drop into the right eye every hour while awake.  . prednisoLONE acetate (PRED FORTE) 1 % ophthalmic suspension Place 1 drop into the left eye 4 (four) times daily. Place 1 drop into the left eye 4 times daily for 7 days   No current facility-administered medications for this visit.  (Ophthalmic Drugs)   Current Outpatient Medications (Other)   Medication Sig  . glipiZIDE (GLUCOTROL XL) 10 MG 24 hr tablet TAKE 1 TABLET BY MOUTH WITH BREAKFAST  . HYDROcodone-acetaminophen (NORCO/VICODIN) 5-325 MG tablet Take 1-2 tablets by mouth every 6 (six) hours as needed for moderate pain.  . INVOKANA 300 MG TABS tablet TAKE 1 TABLET BY MOUTH ONCE DAILY BEFORE BREAKFAST  . losartan (COZAAR) 100 MG tablet Take 1 tablet by mouth once daily  . metFORMIN (GLUCOPHAGE-XR) 500 MG 24 hr tablet Take 2 tablets (1,000 mg total) by mouth 2 (two) times daily.  . metoprolol succinate (TOPROL-XL) 100 MG 24 hr tablet Take 1 tablet (100 mg total) by mouth daily. Take with or immediately following a meal.  . pantoprazole (PROTONIX) 40 MG tablet Take 1 tablet by mouth once daily  . promethazine (PHENERGAN) 12.5 MG tablet 1-2 tabs po q6h prn nausea  . rosuvastatin (CRESTOR) 10 MG tablet Take 1 tablet (10 mg total) by mouth daily.  . sitaGLIPtin-metformin (JANUMET) 50-1000 MG tablet Take 1 tablet by mouth 2 (two) times daily with a meal.  . temazepam (RESTORIL) 15 MG capsule Take 1 capsule (15 mg total) by mouth at bedtime as needed for sleep.   No current facility-administered medications for this visit.  (Other)      REVIEW OF SYSTEMS: ROS    Positive for: Endocrine, Eyes   Negative for: Constitutional, Gastrointestinal, Neurological, Skin, Genitourinary, Musculoskeletal, HENT, Cardiovascular, Respiratory, Psychiatric,  Allergic/Imm, Heme/Lymph   Last edited by Zenovia Jordan, LPN on 9/93/7169  6:78 AM. (History)       ALLERGIES Allergies  Allergen Reactions  . Gluten Meal Swelling  . Lisinopril Cough  . Pioglitazone Other (See Comments)    ELEVATED glucoses + worse chronic nausea    PAST MEDICAL HISTORY Past Medical History:  Diagnosis Date  . Abdominal bloating    likely from diab gastroparesis.  Dr. Havery Moros to do EGD as of 10/2017 GI eval.  . Benign brain tumor ([place])    Cystic lesion in cerebral aqueduct region with mild hydrocephalus--  stable MRI 02/2016.  Surveillance MRI 05/2017 --dilated cerebral aqueduct related to aqueductal stenosis and subsequent mild hydrocephalus (due to the 11 mm stable cystic lesion in cerebral aqueduct---?congenitial?.  . Cataract    OU  . Dysmenorrhea    vicodin occ during first 2 days of cycle.  . Gluten intolerance    pt reports she underwent full GI w/u to r/o celiac dz  . Hepatic steatosis    ultrasound 08/2017  . Hyperlipidemia, mixed   . Hypertensive retinopathy    OU  . Hypertensive retinopathy of both eyes   . Proliferative diabetic retinopathy of both eyes ([place])    steroid injections 10/2017--improved  . Sensorineural hearing loss of left ear    Sudden left hearing loss summer 2016--no improvement with steroids 01/2015 so brain MRI done by Dr. Redmond Baseman and it showed brain tumor that was determined to be benign.  Pt's hearing not bad enough for hearing aid as of 06/2016.  . Type 2 diabetes with complication ([place])    diab retpthy, diabetic gastroparesis (gastric emptying study mildly abnl 03/2017).  Recommended lantus 08/2018 but pt declined.  . White coat hypertension    Past Surgical History:  Procedure Laterality Date  . CHOLECYSTECTOMY  2000  . EYE SURGERY    . GASTRIC EMPTYING SCAN  04/20/2017   Mildly abnormal, particularly the 1st hour of emptying.  Marland Kitchen PARS PLANA VITRECTOMY Right 04/12/2018   Procedure: Right PARS PLANA VITRECTOMY WITH 25 GAUGE with intravitreal antibiotics;  Surgeon: Bernarda Caffey, MD;  Location: [place];  Service: Ophthalmology;  Laterality: Right;    FAMILY HISTORY Family History  Problem Relation Age of Onset  . Brain cancer Mother   . Diabetes Father   . Diabetes Maternal Grandmother   . Cataracts Maternal Grandmother   . Cervical cancer Paternal Grandmother   . Colon cancer Maternal Grandfather 59  . Amblyopia Neg Hx   . Blindness Neg Hx   . Glaucoma Neg Hx   . Macular degeneration Neg Hx   . Retinal detachment Neg Hx   . Strabismus Neg Hx   .  Retinitis pigmentosa Neg Hx     SOCIAL HISTORY Social History   Tobacco Use  . Smoking status: Never Smoker  . Smokeless tobacco: Never Used  Substance Use Topics  . Alcohol use: No  . Drug use: No         OPHTHALMIC EXAM:  Base Eye Exam    Visual Acuity (Snellen - Linear)      Right Left   Dist cc 20/40 -1 20/25 +2   Dist ph cc 20/25 -2 NI   Correction: Glasses       Tonometry (Tonopen, 10:11 AM)      Right Left   Pressure 16 14       Pupils      Dark Light Shape React APD   Right 3 2  Round Brisk None   Left 3 2 Round Brisk None       Visual Fields (Counting fingers)      Left Right    Full Full       Extraocular Movement      Right Left    Full, Ortho Full, Ortho       Neuro/Psych    Oriented x3: Yes   Mood/Affect: Normal       Dilation    Both eyes: 1.0% Mydriacyl, 2.5% Phenylephrine @ 10:11 AM        Slit Lamp and Fundus Exam    Slit Lamp Exam      Right Left   Lids/Lashes Dermatochalasis - upper lid, Meibomian gland dysfunction, Telangiectasia Dermatochalasis - upper lid, Meibomian gland dysfunction, Telangiectasia   Conjunctiva/Sclera White and quiet White and quiet   Cornea Clear, trace Punctate epithelial erosions Trace Punctate epithelial erosions   Anterior Chamber Clear, no Cell/pigment, no hypopyon  Deep and quiet   Iris Round and dilated, No NVI Round and dilated, No NVI   Lens 2+ Nuclear sclerosis, 2+ Cortical cataract, 1+Posterior subcapsular cataract 2+ Nuclear sclerosis, 2+ Cortical cataract   Vitreous post vitrectomy; vitreous condensations improved Vitreous syneresis, diffuse hemorrhage clearing centrally and settling inferiorly       Fundus Exam      Right Left   Disc Pink and Sharp, mild fibrosis superiorly with fine NVD and traction extending superiorly and along ST arcade - maturing Pink and sharp   C/D Ratio 0.1 0.1   Macula Flat, Blunted foveal reflex, mild punctate MA and exudates greatest superior macula Flat,  blunted foveal reflex, scattered MA, exudate and cystic changes, trace ERM nasally    Vessels mild Vascular attenuation, tractional fibrosis along proximal ST arcades Vascular attenuation   Periphery Attached, tractional fibrosis SN quadrant, scattered DBH, 360 PRP -- room for fill in Attached, inferior retina partially obscured by VH; scattered DBH, good 360 PRP laser changes - room for fill in posteriorly          IMAGING AND PROCEDURES  Imaging and Procedures for @TODAY @  OCT, Retina - OU - Both Eyes       Right Eye Quality was good. Central Foveal Thickness: 336. Progression has worsened. Findings include abnormal foveal contour, epiretinal membrane, no SRF, intraretinal hyper-reflective material, vitreous traction, preretinal fibrosis (Interval increase in IRF / IRHM).   Left Eye Quality was good. Central Foveal Thickness: 293. Progression has improved. Findings include intraretinal fluid, no SRF, normal foveal contour, intraretinal hyper-reflective material, epiretinal membrane (mild interval improvement in IRF).   Notes *Images captured and stored on drive  Diagnosis / Impression:  OD: persistent DME --  Interval increase in IRF / IRHM; peripapillary tractional fibrosis extending along superior arcades OS: NFP; +IRF; mild interval decrease in IRF   Clinical management:  See below  Abbreviations: NFP - Normal foveal profile. CME - cystoid macular edema. PED - pigment epithelial detachment. IRF - intraretinal fluid. SRF - subretinal fluid. EZ - ellipsoid zone. ERM - epiretinal membrane. ORA - outer retinal atrophy. ORT - outer retinal tubulation. SRHM - subretinal hyper-reflective material         Intravitreal Injection, Pharmacologic Agent - OD - Right Eye       Time Out 10/16/2018. 10:25 AM. Confirmed correct patient, procedure, site, and patient consented.   Anesthesia Topical anesthesia was used. Anesthetic medications included Lidocaine 2%, Proparacaine 0.5%.    Procedure Preparation included 5% betadine to ocular surface, eyelid  speculum. A supplied needle was used.   Injection:  1.25 mg Bevacizumab (AVASTIN) SOLN   NDC: 94496-759-16, Lot: [id number] , Expiration date: 12/11/2018   Route: Intravitreal, Site: Right Eye, Waste: 0 mL  Post-op Post injection exam found visual acuity of at least counting fingers. The patient tolerated the procedure well. There were no complications. The patient received written and verbal post procedure care education.        Panretinal Photocoagulation - OS - Left Eye       Time Out Confirmed correct patient, procedure, site, and patient consented.   Anesthesia Topical anesthesia was used. Anesthetic medications included Tetracaine 0.5%.   Post-op The patient tolerated the procedure well. There were no complications. The patient received written and verbal post procedure care education.   Notes LASER PROCEDURE NOTE  Diagnosis:   Proliferative Diabetic Retinopathy, left EYE  Procedure:  Pan-retinal photocoagulation using slit lamp laser left EYE, fill-in  Anesthesia:  Topical  Surgeon: 12/24/2018, MD, PhD   Informed consent obtained, operative eye marked, and time out performed prior to initiation of laser.   Lumenis Bernarda Caffey slit lamp laser Pattern: 2x2 square Power: 360 mW Duration: 40 msec  Spot size: 200 microns  # spots: 543 spots posterior fill-in  Complications: None.  Notes: significant vitreous heme obscuring view and preventing laser up take inferiorly and scattered focal areas   Patient tolerated the procedure well and received written and verbal post-procedure care information/education.                 ASSESSMENT/PLAN:    ICD-10-CM   1. Proliferative diabetic retinopathy of both eyes with macular edema associated with type 2 diabetes mellitus (HCC)  [id number] Intravitreal Injection, Pharmacologic Agent - OD - Right Eye    Panretinal Photocoagulation - OS -  Left Eye    Bevacizumab (AVASTIN) SOLN 1.25 mg  2. Retinal edema  H35.81 OCT, Retina - OU - Both Eyes  3. Vitreous hemorrhage of left eye (HCC)  H43.12   4. Right endophthalmia  H44.001   5. Vitreous hemorrhage, right eye ([place])  H43.11   6. Essential hypertension  I10   7. Hypertensive retinopathy of both eyes  H35.033   8. Combined forms of age-related cataract of both eyes  H25.813   9. Vitreous hemorrhage of right eye (HCC)  H43.11     1,2.  Proliferative diabetic retinopathy w/ DME, OU  - HbA1c 8.6% (06.30.20) from 8.4 (02.14.20), but pt reports better control of BG with recent medication adjustments -- added Janumet  - s/p IVA OD #1 9.20.19, #2 (10.25.19), #3 (11.15.19), #4 (12.17.19), #5 (01.14.20), #6 (2.11.20), #7 (05.29.20)  - s/p IVA OS #1 9.27.19, #2 (10.25.19), #3 (11.15.19), #4 (12.17.19), #5 (01.14.20), #6 (2.11.20), #7 (04.26.20), #8 (05.29.20), #9 (06.26.20), # 10 (08.05.20)  - S/P PRP OS (09.20.19), (5.19.20)  - S/P PRP OD (9.27.19 and 11.21.19), fill-in (04.14.20)  - FA (9.20.19) shows +NVE OU and leaking MA and capillary nonperfusion  - repeat FA 11.15.19 shows NV regressing OU  - exam shows improving VH OS -- OCT with mild DME OU  - recommend fill in PRP OS and IVA OD #8 today, 08.19.20  - pt wishes to proceed  - RBA of procedure discussed, questions answered  - informed consent obtained and signed  - see procedure note  - OD w/ VA stable at 20/25, but there is some preretinal fibrosis / tractional membranes just superior to disc and mild central DME  - recommend PPV w/  membrane peel + gas OD under general anesthesia to remove tractional membranes / preretinal fibrosis  - pt wishes to proceed with surgery OD  - RBA of procedure discussed, questions answered  - informed consent obtained and signed  - surgery scheduled for right eye, October 31, 2018 at 11:30am [facility] OR 8  - will need medical clearance and pre-op COVID test  - f/u Friday, September 4 POV   3.  Vitreous hemorrhage OS  - new onset 4.24.20  - etiology: secondary to PDR as described below (no RT/RD on exam)  - s/p IVA OS on 4.26.20, 5.29.20, 6.26.20 and now 8.5.20  - today, VH improved with just mild residual VH settled inferiorly -- center clear  - pt reports recent change in BP/DM meds   - BCVA stable at 20/25   - s/p PRP OS (9.20.19), (05.19.20) -- room for fill in posteriorly now that [facility] has cleared more  - recommend PRP fill in OS today 8.19.20  - RBA of procedure discussed, questions answered  - informed consent obtained and signed  - see procedure note  - start PF or durezol QID OS x5 days  4. Endophthalmitis OD  - s/p IVA OU 04/09/2018  - s/p 25g PPV w/ intravitreal vanc, ceftaz and cefepime OD, 2.14.2020  - s/p intravitreal tap / vanc and ceflaz injections (02.16.20)             - gram stain (2.14.20) shows G+ cocci, WBCs mostly PMNs;   - repeat gram stain from t/i (2.16.20) -- no organisms, just WBCs             - cultures from vitreous grew rare Staph warneri; cultures from t/i -- no growth             - doing well, BCVA 20/25             - inflammation/posterior debris essentially resolved now  - corneal edema resolved             - IOP 16 off Brimonidine  - completed po pred taper -- caused significant elevations in BG  - monitor  5. History of Vitreous Hemorrhage OD -- cleared from PPV for endophthalmitis  - secondary to PDR as described below  - S/P IVA OD #1 (09.20.19), #2 (10.25.19), #3 (11.15.19), #4 (12.16.19), #5 (03/12/2018)  - S/P PRP OD #1 (09.27.19), fill in OD (11.21.19) -- each somewhat limited inferiorly by residual VH  6,7. Hypertensive retinopathy OU  - discussed importance of tight BP control.  - monitor for now  8. Combined form age related cataract OU-   - The symptoms of cataract, surgical options, and treatments and risks were discussed with patient.  - discussed diagnosis and progression  - not yet visually significant  - monitor for  now   Ophthalmic Meds Ordered this visit:  Meds ordered this encounter  Medications  . prednisoLONE acetate (PRED FORTE) 1 % ophthalmic suspension    Sig: Place 1 drop into the left eye 4 (four) times daily. Place 1 drop into the left eye 4 times daily for 7 days    Dispense:  10 mL    Refill:  0  . Bevacizumab (AVASTIN) SOLN 1.25 mg       Return in about 16 days (around 11/01/2018) for POV, ERM OD.  There are no Patient Instructions on file for this visit.   Explained the diagnoses, plan, and follow up with the patient and they expressed understanding.  Patient  expressed understanding of the importance of proper follow up care.   This document serves as a record of services personally performed by Gardiner Sleeper, MD, PhD. It was created on their behalf by Roselee Nova, COMT. The creation of this record is the provider's dictation and/or activities during the visit.  Electronically signed by: Roselee Nova, COMT 10/16/18 11:26 AM    Gardiner Sleeper, M.D., Ph.D. Diseases & Surgery of the Retina and Vitreous [facility]  I have reviewed the above documentation for accuracy and completeness, and I agree with the above. Gardiner Sleeper, M.D., Ph.D. 10/16/18 11:26 AM    Abbreviations: M myopia (nearsighted); A astigmatism; H hyperopia (farsighted); P presbyopia; Mrx spectacle prescription;  CTL contact lenses; OD right eye; OS left eye; OU both eyes  XT exotropia; ET esotropia; PEK punctate epithelial keratitis; PEE punctate epithelial erosions; DES dry eye syndrome; MGD meibomian gland dysfunction; ATs artificial tears; PFAT's preservative free artificial tears; [place] nuclear sclerotic cataract; PSC posterior subcapsular cataract; ERM epi-retinal membrane; PVD posterior vitreous detachment; RD retinal detachment; DM diabetes mellitus; DR diabetic retinopathy; NPDR non-proliferative diabetic retinopathy; PDR proliferative diabetic retinopathy; CSME clinically  significant macular edema; DME diabetic macular edema; dbh dot blot hemorrhages; CWS cotton wool spot; POAG primary open angle glaucoma; C/D cup-to-disc ratio; HVF humphrey visual field; GVF goldmann visual field; OCT optical coherence tomography; IOP intraocular pressure; BRVO Branch retinal vein occlusion; CRVO central retinal vein occlusion; CRAO central retinal artery occlusion; BRAO branch retinal artery occlusion; RT retinal tear; SB scleral buckle; PPV pars plana vitrectomy; VH Vitreous hemorrhage; PRP panretinal laser photocoagulation; IVK intravitreal kenalog; VMT vitreomacular traction; MH Macular hole;  NVD neovascularization of the disc; NVE neovascularization elsewhere; AREDS age related eye disease study; ARMD age related macular degeneration; POAG primary open angle glaucoma; EBMD epithelial/anterior basement membrane dystrophy; ACIOL anterior chamber intraocular lens; IOL intraocular lens; PCIOL posterior chamber intraocular lens; Phaco/IOL phacoemulsification with intraocular lens placement; [place] photorefractive keratectomy; LASIK laser assisted in situ keratomileusis; HTN hypertension; DM diabetes mellitus; COPD chronic obstructive pulmonary disease

## 2018-10-16 ENCOUNTER — Encounter (INDEPENDENT_AMBULATORY_CARE_PROVIDER_SITE_OTHER): Payer: Self-pay | Admitting: Ophthalmology

## 2018-10-16 ENCOUNTER — Ambulatory Visit (INDEPENDENT_AMBULATORY_CARE_PROVIDER_SITE_OTHER): Payer: BLUE CROSS/BLUE SHIELD | Admitting: Ophthalmology

## 2018-10-16 ENCOUNTER — Other Ambulatory Visit: Payer: Self-pay

## 2018-10-16 DIAGNOSIS — H35033 Hypertensive retinopathy, bilateral: Secondary | ICD-10-CM

## 2018-10-16 DIAGNOSIS — E113513 Type 2 diabetes mellitus with proliferative diabetic retinopathy with macular edema, bilateral: Secondary | ICD-10-CM | POA: Diagnosis not present

## 2018-10-16 DIAGNOSIS — H4312 Vitreous hemorrhage, left eye: Secondary | ICD-10-CM

## 2018-10-16 DIAGNOSIS — H44001 Unspecified purulent endophthalmitis, right eye: Secondary | ICD-10-CM

## 2018-10-16 DIAGNOSIS — H3581 Retinal edema: Secondary | ICD-10-CM

## 2018-10-16 DIAGNOSIS — I10 Essential (primary) hypertension: Secondary | ICD-10-CM

## 2018-10-16 DIAGNOSIS — H25813 Combined forms of age-related cataract, bilateral: Secondary | ICD-10-CM

## 2018-10-16 DIAGNOSIS — H4311 Vitreous hemorrhage, right eye: Secondary | ICD-10-CM

## 2018-10-16 MED ORDER — PREDNISOLONE ACETATE 1 % OP SUSP: 1.0000 [drp] | Freq: Four times a day (QID) | OPHTHALMIC | 0 refills | Status: DC

## 2018-10-16 MED ORDER — BEVACIZUMAB CHEMO INJECTION 1.25MG/0.05ML SYRINGE FOR KALEIDOSCOPE
1.2500 mg | INTRAVITREAL | Status: AC | PRN
  Administered 2018-10-16: 1.25 mg via INTRAVITREAL

## 2018-10-17 ENCOUNTER — Telehealth: Payer: Self-pay

## 2018-10-17 NOTE — Telephone Encounter (Signed)
Received surgical clearance form for upcoming procedure on 10/31/18. Pt's last visit was 07/10/18 for f/u [facility] and labs 08/27/18.  PCP will review and sign, if appropriate.

## 2018-10-17 NOTE — Telephone Encounter (Signed)
Surg clearance form completed and put on your desk.

## 2018-10-18 ENCOUNTER — Other Ambulatory Visit: Payer: Self-pay | Admitting: Family Medicine

## 2018-10-18 NOTE — Telephone Encounter (Signed)
Placed in bin to go up front  

## 2018-10-28 ENCOUNTER — Other Ambulatory Visit (HOSPITAL_COMMUNITY)
Admission: RE | Admit: 2018-10-28 | Discharge: 2018-10-28 | Disposition: A | Discharge status: Home / Self Care | Payer: BLUE CROSS/BLUE SHIELD | Source: Ambulatory Visit | Attending: Ophthalmology | Admitting: Ophthalmology

## 2018-10-28 DIAGNOSIS — Z20828 Contact with and (suspected) exposure to other viral communicable diseases: Secondary | ICD-10-CM | POA: Diagnosis not present

## 2018-10-28 DIAGNOSIS — Z01812 Encounter for preprocedural laboratory examination: Secondary | ICD-10-CM | POA: Diagnosis not present

## 2018-10-28 LAB — SARS CORONAVIRUS 2 (TAT 6-24 HRS): SARS Coronavirus 2: NEGATIVE

## 2018-10-28 NOTE — H&P (Signed)
Gloria Gomez is an 48 y.o. female.    Chief Complaint: Proliferative diabetic retinopathy with preretinal fibrosis / tractional membranes OD  HPI: Pt with history of proliferative diabetic retinopathy and endophthalmitis OD s/p 25g PPV w/ intravitreal vanc and ceftaz on  2.16.2020. Has developed progressive preretinal fibrosis and tractional membranes OD. Discussed findings, prognosis and treatment options and pt elects to proceed with 25g PPV w/ membrane peel, endolaser and gas OD under general anesthesia.  Past Medical History:  Diagnosis Date  . Abdominal bloating    likely from diab gastroparesis.  Dr. Havery Moros to do EGD as of 10/2017 GI eval.  . Benign brain tumor ([place])    Cystic lesion in cerebral aqueduct region with mild hydrocephalus-- stable MRI 02/2016.  Surveillance MRI 05/2017 --dilated cerebral aqueduct related to aqueductal stenosis and subsequent mild hydrocephalus (due to the 11 mm stable cystic lesion in cerebral aqueduct---?congenitial?.  . Cataract    OU  . Dysmenorrhea    vicodin occ during first 2 days of cycle.  . Gluten intolerance    pt reports she underwent full GI w/u to r/o celiac dz  . Hepatic steatosis    ultrasound 08/2017  . Hyperlipidemia, mixed   . Hypertensive retinopathy    OU  . Hypertensive retinopathy of both eyes   . Proliferative diabetic retinopathy of both eyes ([place])    steroid injections 10/2017--improved  . Sensorineural hearing loss of left ear    Sudden left hearing loss summer 2016--no improvement with steroids 01/2015 so brain MRI done by Dr. Redmond Baseman and it showed brain tumor that was determined to be benign.  Pt's hearing not bad enough for hearing aid as of 06/2016.  . Type 2 diabetes with complication ([place])    diab retpthy, diabetic gastroparesis (gastric emptying study mildly abnl 03/2017).  Recommended lantus 08/2018 but pt declined.  . White coat hypertension     Past Surgical History:  Procedure Laterality Date  . CHOLECYSTECTOMY   2000  . EYE SURGERY    . GASTRIC EMPTYING SCAN  04/20/2017   Mildly abnormal, particularly the 1st hour of emptying.  Marland Kitchen PARS PLANA VITRECTOMY Right 04/12/2018   Procedure: Right PARS PLANA VITRECTOMY WITH 25 GAUGE with intravitreal antibiotics;  Surgeon: Bernarda Caffey, MD;  Location: [place];  Service: Ophthalmology;  Laterality: Right;    Family History  Problem Relation Age of Onset  . Brain cancer Mother   . Diabetes Father   . Diabetes Maternal Grandmother   . Cataracts Maternal Grandmother   . Cervical cancer Paternal Grandmother   . Colon cancer Maternal Grandfather 41  . Amblyopia Neg Hx   . Blindness Neg Hx   . Glaucoma Neg Hx   . Macular degeneration Neg Hx   . Retinal detachment Neg Hx   . Strabismus Neg Hx   . Retinitis pigmentosa Neg Hx    Social History:  reports that she has never smoked. She has never used smokeless tobacco. She reports that she does not drink alcohol or use drugs.  Allergies:  Allergies  Allergen Reactions  . Gluten Meal Swelling  . Lisinopril Cough  . Pioglitazone Other (See Comments)    ELEVATED glucoses + worse chronic nausea    No medications prior to admission.    Review of systems otherwise negative  There were no vitals taken for this visit.  Physical exam: Mental status: oriented x3. Eyes: See eye exam associated with this date of surgery Ears, Nose, Throat: within normal limits Neck: Within Normal  limits General: within normal limits Chest: Within normal limits Breast: deferred Heart: Within normal limits Abdomen: Within normal limits GU: deferred Extremities: within normal limits Skin: within normal limits  Assessment/Plan 1. Proliferative diabetic retinopathy with progressive preretinal fibrosis and tractional membranes OD  Plan: To [facility] for 25g PPV w/ membrane peel, endolaser and gas injection OD under general anesthesia - case scheduled for 9.3.2020, 1130 am, [facility] OR 08  Gardiner Sleeper, M.D.,  Ph.D. Vitreoretinal Surgeon [facility]

## 2018-10-29 ENCOUNTER — Encounter (HOSPITAL_COMMUNITY): Payer: Self-pay | Admitting: *Deleted

## 2018-10-29 NOTE — Progress Notes (Signed)
Gloria Gomez denies chest pain or shortness of breath.   Gloria Gomez has type II diabetes, patient reports that CBGs usually run in the 120's in am, by she did have a spike to 31 recently. I instructed Gloria Gomez to not take [place] 9/1 or 9/2.  [place] instructed patient to not take Glipizide or Jamumet the morning of surgery. Gloria Gomez voices concern about not taking Invokana 2 days, "I am afraid that it might be in the 300's. Gloria Gomez reports that Dr Joanie Coddington her diabetes, I instructed her to call and ask him for instructions. I instructed patient to check CBG after awaking and every 2 hours until arrival  to the hospital.  I Instructed patient if CBG is less than 70 to drink 1/2 cup of a clear juice. Recheck CBG in 15 minutes then call pre- op desk at [phone] for further instructions.

## 2018-10-31 ENCOUNTER — Ambulatory Visit (HOSPITAL_COMMUNITY)
Admission: RE | Admit: 2018-10-31 | Discharge: 2018-10-31 | Disposition: A | Discharge status: Home / Self Care | Payer: BLUE CROSS/BLUE SHIELD | Attending: Ophthalmology | Admitting: Ophthalmology

## 2018-10-31 ENCOUNTER — Ambulatory Visit (HOSPITAL_COMMUNITY): Payer: BLUE CROSS/BLUE SHIELD | Admitting: Certified Registered Nurse Anesthetist

## 2018-10-31 ENCOUNTER — Other Ambulatory Visit: Payer: Self-pay

## 2018-10-31 ENCOUNTER — Encounter (HOSPITAL_COMMUNITY): Payer: Self-pay | Admitting: *Deleted

## 2018-10-31 ENCOUNTER — Encounter (HOSPITAL_COMMUNITY)
Admission: RE | Disposition: A | Discharge status: Home / Self Care | Payer: Self-pay | Source: Home / Self Care | Attending: Ophthalmology

## 2018-10-31 DIAGNOSIS — K3184 Gastroparesis: Secondary | ICD-10-CM | POA: Insufficient documentation

## 2018-10-31 DIAGNOSIS — H35371 Puckering of macula, right eye: Secondary | ICD-10-CM | POA: Diagnosis not present

## 2018-10-31 DIAGNOSIS — E113591 Type 2 diabetes mellitus with proliferative diabetic retinopathy without macular edema, right eye: Secondary | ICD-10-CM | POA: Insufficient documentation

## 2018-10-31 DIAGNOSIS — I10 Essential (primary) hypertension: Secondary | ICD-10-CM | POA: Insufficient documentation

## 2018-10-31 DIAGNOSIS — E119 Type 2 diabetes mellitus without complications: Secondary | ICD-10-CM | POA: Diagnosis not present

## 2018-10-31 DIAGNOSIS — E1143 Type 2 diabetes mellitus with diabetic autonomic (poly)neuropathy: Secondary | ICD-10-CM | POA: Diagnosis not present

## 2018-10-31 DIAGNOSIS — H44001 Unspecified purulent endophthalmitis, right eye: Secondary | ICD-10-CM | POA: Diagnosis not present

## 2018-10-31 DIAGNOSIS — Z7984 Long term (current) use of oral hypoglycemic drugs: Secondary | ICD-10-CM | POA: Insufficient documentation

## 2018-10-31 DIAGNOSIS — H5789 Other specified disorders of eye and adnexa: Secondary | ICD-10-CM | POA: Insufficient documentation

## 2018-10-31 HISTORY — PX: MEMBRANE PEEL: SHX5967

## 2018-10-31 HISTORY — PX: PHOTOCOAGULATION WITH LASER: SHX6027

## 2018-10-31 HISTORY — PX: GAS INSERTION: SHX5336

## 2018-10-31 HISTORY — DX: Insomnia, unspecified: G47.00

## 2018-10-31 HISTORY — PX: PARS PLANA VITRECTOMY: SHX2166

## 2018-10-31 LAB — CBC
HCT: 33.4 % — ABNORMAL LOW (ref 36.0–46.0)
Hemoglobin: 10.3 g/dL — ABNORMAL LOW (ref 12.0–15.0)
MCH: 25.4 pg — ABNORMAL LOW (ref 26.0–34.0)
MCHC: 30.8 g/dL (ref 30.0–36.0)
MCV: 82.5 fL (ref 80.0–100.0)
Platelets: 445 10*3/uL — ABNORMAL HIGH (ref 150–400)
RBC: 4.05 MIL/uL (ref 3.87–5.11)
RDW: 17.2 % — ABNORMAL HIGH (ref 11.5–15.5)
WBC: 12.1 10*3/uL — ABNORMAL HIGH (ref 4.0–10.5)
nRBC: 0 % (ref 0.0–0.2)

## 2018-10-31 LAB — BASIC METABOLIC PANEL
Anion gap: 12 (ref 5–15)
BUN: 11 mg/dL (ref 6–20)
CO2: 19 mmol/L — ABNORMAL LOW (ref 22–32)
Calcium: 9 mg/dL (ref 8.9–10.3)
Chloride: 105 mmol/L (ref 98–111)
Creatinine, Ser: 0.57 mg/dL (ref 0.44–1.00)
GFR calc Af Amer: >60 mL/min (ref >60)
GFR calc non Af Amer: >60 mL/min (ref >60)
Glucose, Bld: 145 mg/dL — ABNORMAL HIGH (ref 70–99)
Potassium: 4.1 mmol/L (ref 3.5–5.1)
Sodium: 136 mmol/L (ref 135–145)

## 2018-10-31 LAB — GLUCOSE, CAPILLARY
Glucose-Capillary: 136 mg/dL — ABNORMAL HIGH (ref 70–99)
Glucose-Capillary: 197 mg/dL — ABNORMAL HIGH (ref 70–99)

## 2018-10-31 LAB — POCT PREGNANCY, URINE: Preg Test, Ur: NEGATIVE

## 2018-10-31 SURGERY — MEMBRANECTOMY, RETINA
Anesthesia: General | Site: Eye | Laterality: Right

## 2018-10-31 MED ORDER — SUCCINYLCHOLINE CHLORIDE 200 MG/10ML IV SOSY
PREFILLED_SYRINGE | INTRAVENOUS | Status: DC | PRN
  Administered 2018-10-31: 80 mg via INTRAVENOUS

## 2018-10-31 MED ORDER — ROCURONIUM BROMIDE 10 MG/ML (PF) SYRINGE
PREFILLED_SYRINGE | INTRAVENOUS | Status: AC
  Filled 2018-10-31: qty 10

## 2018-10-31 MED ORDER — CARBACHOL 0.01 % IO SOLN
INTRAOCULAR | Status: AC
  Filled 2018-10-31: qty 1.5

## 2018-10-31 MED ORDER — POLYMYXIN B SULFATE 500000 UNITS IJ SOLR
INTRAMUSCULAR | Status: AC
  Filled 2018-10-31: qty 500000

## 2018-10-31 MED ORDER — 0.9 % SODIUM CHLORIDE (POUR BTL) OPTIME
TOPICAL | Status: DC | PRN
  Administered 2018-10-31: 11:00:00 1000 mL

## 2018-10-31 MED ORDER — DEXAMETHASONE SODIUM PHOSPHATE 10 MG/ML IJ SOLN
INTRAMUSCULAR | Status: AC
  Filled 2018-10-31: qty 1

## 2018-10-31 MED ORDER — PROPOFOL 10 MG/ML IV BOLUS
INTRAVENOUS | Status: DC | PRN
  Administered 2018-10-31: 120 mg via INTRAVENOUS

## 2018-10-31 MED ORDER — LIDOCAINE 2% (20 MG/ML) 5 ML SYRINGE
INTRAMUSCULAR | Status: DC | PRN
  Administered 2018-10-31: 60 mg via INTRAVENOUS

## 2018-10-31 MED ORDER — TRIAMCINOLONE ACETONIDE 40 MG/ML IJ SUSP
INTRAMUSCULAR | Status: DC | PRN
  Administered 2018-10-31: 40 mg via INTRAMUSCULAR

## 2018-10-31 MED ORDER — DORZOLAMIDE HCL-TIMOLOL MAL 2-0.5 % OP SOLN
OPHTHALMIC | Status: AC
  Filled 2018-10-31: qty 10

## 2018-10-31 MED ORDER — LIDOCAINE 2% (20 MG/ML) 5 ML SYRINGE
INTRAMUSCULAR | Status: AC
  Filled 2018-10-31: qty 5

## 2018-10-31 MED ORDER — SUCCINYLCHOLINE CHLORIDE 200 MG/10ML IV SOSY
PREFILLED_SYRINGE | INTRAVENOUS | Status: AC
  Filled 2018-10-31: qty 10

## 2018-10-31 MED ORDER — TRIAMCINOLONE ACETONIDE 40 MG/ML IJ SUSP
INTRAMUSCULAR | Status: AC
  Filled 2018-10-31: qty 5

## 2018-10-31 MED ORDER — MIDAZOLAM HCL 2 MG/2ML IJ SOLN
INTRAMUSCULAR | Status: AC
  Filled 2018-10-31: qty 2

## 2018-10-31 MED ORDER — SODIUM CHLORIDE 0.9 % IV SOLN
INTRAVENOUS | Status: DC | PRN
  Administered 2018-10-31: 25 ug/min via INTRAVENOUS

## 2018-10-31 MED ORDER — BACITRACIN-POLYMYXIN B 500-10000 UNIT/GM OP OINT
TOPICAL_OINTMENT | OPHTHALMIC | Status: AC
  Filled 2018-10-31: qty 3.5

## 2018-10-31 MED ORDER — PREDNISOLONE ACETATE 1 % OP SUSP
OPHTHALMIC | Status: DC | PRN
  Administered 2018-10-31: 1 [drp] via OPHTHALMIC

## 2018-10-31 MED ORDER — EPINEPHRINE PF 1 MG/ML IJ SOLN
INTRAOCULAR | Status: DC | PRN
  Administered 2018-10-31: 11:00:00 500 mL

## 2018-10-31 MED ORDER — ACETAZOLAMIDE SODIUM 500 MG IJ SOLR
INTRAMUSCULAR | Status: AC
  Filled 2018-10-31: qty 500

## 2018-10-31 MED ORDER — BSS IO SOLN
INTRAOCULAR | Status: AC
  Filled 2018-10-31: qty 15

## 2018-10-31 MED ORDER — PHENYLEPHRINE HCL 10 % OP SOLN
1.0000 [drp] | OPHTHALMIC | Status: AC | PRN
  Administered 2018-10-31 (×3): 1 [drp] via OPHTHALMIC
  Filled 2018-10-31: qty 5

## 2018-10-31 MED ORDER — PHENYLEPHRINE 40 MCG/ML (10ML) SYRINGE FOR IV PUSH (FOR BLOOD PRESSURE SUPPORT)
PREFILLED_SYRINGE | INTRAVENOUS | Status: AC
  Filled 2018-10-31: qty 10

## 2018-10-31 MED ORDER — FENTANYL CITRATE (PF) 250 MCG/5ML IJ SOLN
INTRAMUSCULAR | Status: AC
  Filled 2018-10-31: qty 5

## 2018-10-31 MED ORDER — DIPHENHYDRAMINE HCL 50 MG/ML IJ SOLN
INTRAMUSCULAR | Status: DC | PRN
  Administered 2018-10-31: 12.5 mg via INTRAVENOUS

## 2018-10-31 MED ORDER — ATROPINE SULFATE 1 % OP SOLN
1.0000 [drp] | OPHTHALMIC | Status: AC | PRN
  Administered 2018-10-31 (×3): 1 [drp] via OPHTHALMIC
  Filled 2018-10-31: qty 2

## 2018-10-31 MED ORDER — BUPIVACAINE HCL (PF) 0.75 % IJ SOLN
INTRAMUSCULAR | Status: AC
  Filled 2018-10-31: qty 10

## 2018-10-31 MED ORDER — MIDAZOLAM HCL 2 MG/2ML IJ SOLN
INTRAMUSCULAR | Status: DC | PRN
  Administered 2018-10-31: 2 mg via INTRAVENOUS

## 2018-10-31 MED ORDER — CEFAZOLIN SUBCONJUNCTIVAL INJECTION 100 MG/0.5 ML
200.0000 mg | INJECTION | SUBCONJUNCTIVAL | Status: DC
  Filled 2018-10-31: qty 5

## 2018-10-31 MED ORDER — ACETYLCHOLINE CHLORIDE 20 MG IO SOLR
INTRAOCULAR | Status: AC
  Filled 2018-10-31: qty 1

## 2018-10-31 MED ORDER — BSS PLUS IO SOLN
INTRAOCULAR | Status: AC
  Filled 2018-10-31: qty 500

## 2018-10-31 MED ORDER — ONDANSETRON HCL 4 MG/2ML IJ SOLN
INTRAMUSCULAR | Status: DC | PRN
  Administered 2018-10-31: 4 mg via INTRAVENOUS

## 2018-10-31 MED ORDER — TOBRAMYCIN-DEXAMETHASONE 0.3-0.1 % OP OINT
TOPICAL_OINTMENT | OPHTHALMIC | Status: AC
  Filled 2018-10-31: qty 3.5

## 2018-10-31 MED ORDER — SODIUM CHLORIDE FLUSH 0.9 % IV SOLN
INTRAVENOUS | Status: AC
  Filled 2018-10-31: qty 10

## 2018-10-31 MED ORDER — EPINEPHRINE PF 1 MG/ML IJ SOLN
INTRAMUSCULAR | Status: AC
  Filled 2018-10-31: qty 1

## 2018-10-31 MED ORDER — GATIFLOXACIN 0.5 % OP SOLN
OPHTHALMIC | Status: AC
  Filled 2018-10-31: qty 2.5

## 2018-10-31 MED ORDER — PROPARACAINE HCL 0.5 % OP SOLN
1.0000 [drp] | OPHTHALMIC | Status: AC | PRN
  Administered 2018-10-31 (×3): 1 [drp] via OPHTHALMIC
  Filled 2018-10-31: qty 15

## 2018-10-31 MED ORDER — DEXAMETHASONE SODIUM PHOSPHATE 10 MG/ML IJ SOLN
INTRAMUSCULAR | Status: DC | PRN
  Administered 2018-10-31: 5 mg via INTRAVENOUS

## 2018-10-31 MED ORDER — BRIMONIDINE TARTRATE 0.2 % OP SOLN
OPHTHALMIC | Status: AC
  Filled 2018-10-31: qty 5

## 2018-10-31 MED ORDER — ATROPINE SULFATE 1 % OP SOLN
OPHTHALMIC | Status: DC | PRN
  Administered 2018-10-31: 1 [drp] via OPHTHALMIC

## 2018-10-31 MED ORDER — CEFTAZIDIME 1 G IJ SOLR
INTRAMUSCULAR | Status: AC
  Filled 2018-10-31: qty 1

## 2018-10-31 MED ORDER — TROPICAMIDE 1 % OP SOLN
1.0000 [drp] | OPHTHALMIC | Status: AC | PRN
  Administered 2018-10-31 (×3): 1 [drp] via OPHTHALMIC
  Filled 2018-10-31: qty 15

## 2018-10-31 MED ORDER — INDOCYANINE GREEN 25 MG IV SOLR
INTRAVENOUS | Status: AC
  Filled 2018-10-31: qty 25

## 2018-10-31 MED ORDER — SODIUM HYALURONATE 10 MG/ML IO SOLN
INTRAOCULAR | Status: AC
  Filled 2018-10-31: qty 1.7

## 2018-10-31 MED ORDER — NA CHONDROIT SULF-NA HYALURON 40-30 MG/ML IO SOLN
INTRAOCULAR | Status: AC
  Filled 2018-10-31: qty 1

## 2018-10-31 MED ORDER — NA CHONDROIT SULF-NA HYALURON 40-30 MG/ML IO SOLN
INTRAOCULAR | Status: DC | PRN
  Administered 2018-10-31: 0.5 mL via INTRAOCULAR

## 2018-10-31 MED ORDER — LIDOCAINE HCL (PF) 2 % IJ SOLN
INTRAMUSCULAR | Status: DC | PRN
  Administered 2018-10-31: .000001 mL via INTRAMUSCULAR

## 2018-10-31 MED ORDER — PHENYLEPHRINE 40 MCG/ML (10ML) SYRINGE FOR IV PUSH (FOR BLOOD PRESSURE SUPPORT)
PREFILLED_SYRINGE | INTRAVENOUS | Status: DC | PRN
  Administered 2018-10-31: 80 ug via INTRAVENOUS
  Administered 2018-10-31: 120 ug via INTRAVENOUS

## 2018-10-31 MED ORDER — STERILE WATER FOR INJECTION IJ SOLN
INTRAMUSCULAR | Status: AC
  Filled 2018-10-31: qty 10

## 2018-10-31 MED ORDER — HYALURONIDASE HUMAN 150 UNIT/ML IJ SOLN
INTRAMUSCULAR | Status: AC
  Filled 2018-10-31: qty 1

## 2018-10-31 MED ORDER — ROCURONIUM BROMIDE 10 MG/ML (PF) SYRINGE
PREFILLED_SYRINGE | INTRAVENOUS | Status: DC | PRN
  Administered 2018-10-31: 30 mg via INTRAVENOUS
  Administered 2018-10-31: 50 mg via INTRAVENOUS

## 2018-10-31 MED ORDER — BACITRACIN-POLYMYXIN B 500-10000 UNIT/GM OP OINT
TOPICAL_OINTMENT | OPHTHALMIC | Status: DC | PRN
  Administered 2018-10-31: 1 via OPHTHALMIC

## 2018-10-31 MED ORDER — PREDNISOLONE ACETATE 1 % OP SUSP
OPHTHALMIC | Status: AC
  Filled 2018-10-31: qty 5

## 2018-10-31 MED ORDER — SUGAMMADEX SODIUM 200 MG/2ML IV SOLN
INTRAVENOUS | Status: DC | PRN
  Administered 2018-10-31: 149.6 mg via INTRAVENOUS

## 2018-10-31 MED ORDER — SODIUM CHLORIDE (PF) 0.9 % IJ SOLN
INTRAMUSCULAR | Status: AC
  Filled 2018-10-31: qty 10

## 2018-10-31 MED ORDER — HYPROMELLOSE (GONIOSCOPIC) 2.5 % OP SOLN
OPHTHALMIC | Status: AC
  Filled 2018-10-31: qty 15

## 2018-10-31 MED ORDER — LIDOCAINE HCL 2 % IJ SOLN
INTRAMUSCULAR | Status: AC
  Filled 2018-10-31: qty 20

## 2018-10-31 MED ORDER — DORZOLAMIDE HCL-TIMOLOL MAL 2-0.5 % OP SOLN
OPHTHALMIC | Status: DC | PRN
  Administered 2018-10-31: 1 [drp] via OPHTHALMIC

## 2018-10-31 MED ORDER — FENTANYL CITRATE (PF) 250 MCG/5ML IJ SOLN
INTRAMUSCULAR | Status: DC | PRN
  Administered 2018-10-31: 50 ug via INTRAVENOUS
  Administered 2018-10-31: 100 ug via INTRAVENOUS

## 2018-10-31 MED ORDER — BRIMONIDINE TARTRATE 0.2 % OP SOLN
OPHTHALMIC | Status: DC | PRN
  Administered 2018-10-31: 1 [drp] via OPHTHALMIC

## 2018-10-31 MED ORDER — ONDANSETRON HCL 4 MG/2ML IJ SOLN
INTRAMUSCULAR | Status: AC
  Filled 2018-10-31: qty 2

## 2018-10-31 MED ORDER — ATROPINE SULFATE 1 % OP SOLN
OPHTHALMIC | Status: AC
  Filled 2018-10-31: qty 5

## 2018-10-31 MED ORDER — SODIUM CHLORIDE 0.9 % IV SOLN
INTRAVENOUS | Status: DC
  Administered 2018-10-31: 11:00:00 via INTRAVENOUS

## 2018-10-31 MED ORDER — STERILE WATER FOR INJECTION IJ SOLN
INTRAMUSCULAR | Status: DC | PRN
  Administered 2018-10-31: 20 mL

## 2018-10-31 SURGICAL SUPPLY — 48 items
APL SWBSTK 6 STRL LF DISP (MISCELLANEOUS) ×4
APPLICATOR COTTON TIP 6 STRL (MISCELLANEOUS) ×4 IMPLANT
APPLICATOR COTTON TIP 6IN STRL (MISCELLANEOUS) ×8
BNDG EYE OVAL (GAUZE/BANDAGES/DRESSINGS) ×2 IMPLANT
CABLE BIPOLOR RESECTION CORD (MISCELLANEOUS) ×2 IMPLANT
CANNULA FLEX TIP 25G (CANNULA) ×2 IMPLANT
CANNULA TROCAR 25 GA VLV (OPHTHALMIC) ×2 IMPLANT
CANNULA TROCAR 25G 4 VLV (OPHTHALMIC) IMPLANT
CLSR STERI-STRIP ANTIMIC 1/2X4 (GAUZE/BANDAGES/DRESSINGS) ×2 IMPLANT
DRAPE MICROSCOPE LEICA 46X105 (MISCELLANEOUS) ×2 IMPLANT
DRAPE OPHTHALMIC 77X100 STRL (CUSTOM PROCEDURE TRAY) ×2 IMPLANT
FORCEPS GRIESHABER ILM 25G A (INSTRUMENTS) ×1 IMPLANT
GAS AUTO FILL CONSTEL (OPHTHALMIC) ×2
GAS AUTO FILL CONSTELLATION (OPHTHALMIC) IMPLANT
GLOVE BIO SURGEON STRL SZ7.5 (GLOVE) ×4 IMPLANT
GLOVE BIOGEL M 7.0 STRL (GLOVE) ×2 IMPLANT
GOWN STRL REUS W/ TWL LRG LVL3 (GOWN DISPOSABLE) ×2 IMPLANT
GOWN STRL REUS W/ TWL XL LVL3 (GOWN DISPOSABLE) ×1 IMPLANT
GOWN STRL REUS W/TWL LRG LVL3 (GOWN DISPOSABLE) ×4
GOWN STRL REUS W/TWL XL LVL3 (GOWN DISPOSABLE) ×2
KIT BASIN OR (CUSTOM PROCEDURE TRAY) ×2 IMPLANT
LENS MACULAR ASPHERIC CONSTEL (OPHTHALMIC) ×1 IMPLANT
NDL 18GX1X1/2 (RX/OR ONLY) (NEEDLE) ×1 IMPLANT
NDL HYPO 30X.5 LL (NEEDLE) ×2 IMPLANT
NDL RETROBULBAR 25GX1.5 (NEEDLE) IMPLANT
NEEDLE 18GX1X1/2 (RX/OR ONLY) (NEEDLE) ×8 IMPLANT
NEEDLE HYPO 30X.5 LL (NEEDLE) ×4 IMPLANT
NEEDLE RETROBULBAR 25GX1.5 (NEEDLE) ×2 IMPLANT
NS IRRIG 1000ML POUR BTL (IV SOLUTION) ×2 IMPLANT
PACK VITRECTOMY CUSTOM (CUSTOM PROCEDURE TRAY) ×2 IMPLANT
PAD ARMBOARD 7.5X6 YLW CONV (MISCELLANEOUS) ×4 IMPLANT
PAK PIK VITRECTOMY CVS 25GA (OPHTHALMIC) ×2 IMPLANT
PENCIL BIPOLAR 25GA STR DISP (OPHTHALMIC RELATED) ×2 IMPLANT
PIC ILLUMINATED 25G (OPHTHALMIC) ×2
PIK ILLUMINATED 25G (OPHTHALMIC) IMPLANT
PROBE ENDO DIATHERMY 25G (MISCELLANEOUS) ×1 IMPLANT
PROBE LASER ILLUM FLEX CVD 25G (OPHTHALMIC) ×1 IMPLANT
REPL STRA BRUSH NDL (NEEDLE) IMPLANT
REPL STRA BRUSH NEEDLE (NEEDLE) ×2 IMPLANT
SHIELD EYE LENSE ONLY DISP (GAUZE/BANDAGES/DRESSINGS) ×1 IMPLANT
SUT VICRYL 7 0 TG140 8 (SUTURE) ×2 IMPLANT
SYR 10ML LL (SYRINGE) ×3 IMPLANT
SYR TB 1ML LUER SLIP (SYRINGE) ×4 IMPLANT
TAPE SURG TRANSPORE 1 IN (GAUZE/BANDAGES/DRESSINGS) IMPLANT
TAPE SURGICAL TRANSPORE 1 IN (GAUZE/BANDAGES/DRESSINGS) ×1
TOWEL GREEN STERILE FF (TOWEL DISPOSABLE) ×2 IMPLANT
TUBING HIGH PRESS EXTEN 6IN (TUBING) ×2 IMPLANT
WATER STERILE IRR 1000ML POUR (IV SOLUTION) ×2 IMPLANT

## 2018-10-31 NOTE — Transfer of Care (Signed)
Immediate Anesthesia Transfer of Care Note  Patient: Gloria Gomez  Procedure(s) Performed: MEMBRANE PEEL (Right Eye) PARS PLANA VITRECTOMY WITH 25 GAUGE (Right Eye) Photocoagulation With Laser (Right Eye) Insertion Of C3F8 Gas (Right Eye)  Patient Location: PACU  Anesthesia Type:General  Level of Consciousness: awake, alert  and oriented  Airway & Oxygen Therapy: Patient Spontanous Breathing and Patient connected to face mask oxygen  Post-op Assessment: Report given to RN and Post -op Vital signs reviewed and stable  Post vital signs: Reviewed and stable  Last Vitals:  Vitals Value Taken Time  BP 145/75 10/31/18 1420  Temp 36.7 C 10/31/18 1321  Pulse 87 10/31/18 1420  Resp 16 10/31/18 1420  SpO2 100 % 10/31/18 1420    Last Pain:  Vitals:   10/31/18 1420  TempSrc:   PainSc: 0-No pain      Patients Stated Pain Goal: 3 ([id number])  Complications: No apparent anesthesia complications

## 2018-10-31 NOTE — Interval H&P Note (Signed)
History and Physical Interval Note:  10/31/2018 10:21 AM  Gloria Gomez  has presented today for surgery, with the diagnosis of epiretinal membrane, right eye.  The various methods of treatment have been discussed with the patient and family. After consideration of risks, benefits and other options for treatment, the patient has consented to  Procedure(s): MEMBRANE PEEL (Right) PARS PLANA VITRECTOMY WITH 25 GAUGE (Right) as a surgical intervention.  [name] history has been reviewed, patient examined, no change in status, stable for surgery.  I have reviewed the patient's chart and labs.  Questions were answered to the patient's satisfaction.     Bernarda Caffey

## 2018-10-31 NOTE — Anesthesia Postprocedure Evaluation (Signed)
Anesthesia Post Note  Patient: Gloria Gomez  Procedure(s) Performed: MEMBRANE PEEL (Right Eye) PARS PLANA VITRECTOMY WITH 25 GAUGE (Right Eye) Photocoagulation With Laser (Right Eye) Insertion Of C3F8 Gas (Right Eye)     Patient location during evaluation: PACU Anesthesia Type: General Level of consciousness: awake and alert Pain management: pain level controlled Vital Signs Assessment: post-procedure vital signs reviewed and stable Respiratory status: spontaneous breathing, nonlabored ventilation, respiratory function stable and patient connected to nasal cannula oxygen Cardiovascular status: blood pressure returned to baseline and stable Postop Assessment: no apparent nausea or vomiting Anesthetic complications: no    Last Vitals:  Vitals:   10/31/18 1321 10/31/18 1420  BP: 128/68 (!) 145/75  Pulse: 89 87  Resp: 14 16  Temp: 36.7 C   SpO2: 94% 100%    Last Pain:  Vitals:   10/31/18 1420  TempSrc:   PainSc: 0-No pain                 Athony Coppa DAVID

## 2018-10-31 NOTE — Brief Op Note (Signed)
10/31/2018  12:55 PM  PATIENT:  Gloria Gomez  48 y.o. female  PRE-OPERATIVE DIAGNOSIS:  preretinal fibrosis and epiretinal membrane, right eye  POST-OPERATIVE DIAGNOSIS:  preretinal fibrosis and epiretinal membrane, right eye  PROCEDURE:  Procedure(s): MEMBRANE PEEL (Right) PARS PLANA VITRECTOMY WITH 25 GAUGE (Right) Photocoagulation With Laser (Right) Insertion Of C3F8 Gas (Right)  SURGEON:  Surgeon(s) and Role:    Bernarda Caffey, MD - Primary  ASSISTANTS: Ernest Mallick, Ophthalmic Assistant  ANESTHESIA:   general  EBL:  5 mL   BLOOD ADMINISTERED:none  DRAINS: none   LOCAL MEDICATIONS USED:  NONE  SPECIMEN:  No Specimen  DISPOSITION OF SPECIMEN:  N/A  COUNTS:  YES  TOURNIQUET:  * No tourniquets in log *  DICTATION: .Note written in EPIC  PLAN OF CARE: Discharge to home after PACU  PATIENT DISPOSITION:  PACU - hemodynamically stable.   Delay start of Pharmacological VTE agent (>24hrs) due to surgical blood loss or risk of bleeding: not applicable

## 2018-10-31 NOTE — Op Note (Signed)
Date of procedure:  10/31/2018   Surgeon: Bernarda Caffey, MD, PhD   Pre-operative Diagnoses:  Epiretinal membrane / Preretinal fibrosis, right eye History of proliferative diabetic retinopathy and endophthalmitis s/p PPV, right eye   Post-operative diagnosis:  same   Anesthesia: General   Procedure:   1. 25 gauge pars plana vitrectomy, Right Eye 2. Preretinal and epiretinal membrane peel, Right Eye 3. Endolaser, Right Eye  4. Fluid-Air Exchange, Right Eye 5. Injection 10% C3F8, Right Eye    Indications for procedure: This is [name] with a history of proliferative diabetic retinopathy and endophthalmitis right eye, s/p PPV w/ intravitreal antibiotics OD on 2.14.20. The patient developed progressive preretinal, tractional, and epiretinal membranes along the superior arcades, which were threatening her vision. After discussing the risks, benefits, and alternatives to surgery, the patient electively decided to undergo surgical repair and informed consent was obtained. The surgery was an attempt to remove all preretinal membranes and prevent vision loss from a retinal detachment and potentially improve the vision within the reasonable expectations of the surgeon.    Procedure in Detail:    The patient was met in the pre-operative holding area where their identification data was verified.  It was noted that there was a signed, informed consent in the chart and the Right Eye eye was verbally verified by the patient as the operative eye and was marked with a marking pen. The patient was then taken to the operating room and placed in the supine position. General endotracheal anesthesia was induced.  The eye was then prepped with 5% betadine and draped in the normal fashion for ophthalmic surgery. The microscope was draped and swung into position, and a secondary time-out was performed to identify the correct patient, eyes, procedures, and any allergies.   A 25 gauge trocar was inserted in a 30-45  degrees fashion into the inferotemporal quadrant 4 mm posterior to the limbus in this phakic patient. Correct positioning within the vitreous was verified externally with the light pipe. The infusion was then connected to the cannula and BSS infusion was commenced.  Additional ports were placed in the superonasal and superotemporal quadrants. Of note, care was taken to avoid prior trocar sites from her previous PPV for endophthalmitis.  Viscoat was placed on the cornea and the BIOM was used to visualize the posterior segment. The patient had a significant preretinal fibrosis and tractional bands emanating from the superior disc to the superior arcades. A posterior vitreous detachment was induced with the assistance of ILM forceps. Kenalog was used to aid in identifying residual cortical vitreous.      A macular contact lens was placed on the eye. End-grasping ILM forceps, MaxGrip forceps, intraocular scissors and the vitrectomy probe were used to meticulously dissect and remove the tractional, preretinal membranes and bands. The majority of these abnormal membranes and bands were removed from the entire surface of the retina as was deemed safe.  The wide angle viewing system was brought back into position to assist in peeling peripheral membranes off and residual cortical vitreous from the retinal surface. No retinal tears were noted. At this time, supplementary endolaser PRP was applied 360 to fill in posteriorly. A fluid air exchange was then performed.    The superotemporal port was then removed and sutured with 7-0 vicryl, there was no leakage. 10% C3F8 gas was connected to the infusion line and gas was injected into the posterior segment while venting air through the superonasal trocar using the extrusion cannula. Once  a full, 40cc of gas was vented through the eye, the infusion port and venting ports were removed and they were sutured with 7-0 vicryl.  There was no leakage from the sclerotomy sites.    Subconjunctival injections of kefzol + bacitracin + polymixin b and kenalog were then administered, and antibiotic and steroid drops as well as antibiotic ointment were placed in the eye.  The drapes were removed and the eye was patched and shielded.  A green gas bracelet was placed on the patient's right wrist. The patient was then taken to the post-operative area for recovery having tolerated the procedure well.  She was instructed to perform face down positioning postoperatively and to follow up in clinic the following morning as scheduled.   Estimate blood lost: none Complications: None

## 2018-10-31 NOTE — Anesthesia Preprocedure Evaluation (Signed)
Anesthesia Evaluation  Patient identified by MRN, date of birth, ID band Patient awake    Reviewed: Allergy & Precautions, NPO status , Patient's Chart, lab work & pertinent test results  Airway Mallampati: I  TM Distance: >3 FB Neck ROM: Full    Dental   Pulmonary    Pulmonary exam normal        Cardiovascular hypertension, Pt. on medications Normal cardiovascular exam     Neuro/Psych    GI/Hepatic   Endo/Other  diabetes, Type 2, Oral Hypoglycemic Agents  Renal/GU      Musculoskeletal   Abdominal   Peds  Hematology   Anesthesia Other Findings   Reproductive/Obstetrics                             Anesthesia Physical Anesthesia Plan  ASA: II  Anesthesia Plan: General   Post-op Pain Management:    Induction: Intravenous  PONV Risk Score and Plan: 3 and Ondansetron, Treatment may vary due to age or medical condition, Midazolam and Dexamethasone  Airway Management Planned: Oral ETT  Additional Equipment:   Intra-op Plan:   Post-operative Plan: Extubation in OR  Informed Consent: I have reviewed the patients History and Physical, chart, labs and discussed the procedure including the risks, benefits and alternatives for the proposed anesthesia with the patient or authorized representative who has indicated his/her understanding and acceptance.       Plan Discussed with: CRNA and Surgeon  Anesthesia Plan Comments:         Anesthesia Quick Evaluation

## 2018-10-31 NOTE — Progress Notes (Addendum)
[facility] Note  11/01/2018     CHIEF COMPLAINT Patient presents for Retina Follow Up   HISTORY OF PRESENT ILLNESS: Gloria Gomez is a 48 y.o. female who presents to the clinic today for:   HPI    Retina Follow Up    Patient presents with  Diabetic Retinopathy.  In both eyes.  This started 1 day ago.  Severity is moderate.  I, the attending physician,  performed the HPI with the patient and updated documentation appropriately.          Comments    Patient here for 1 day follow up for PDR OU ( s/p PPV + MP OD 09.03.20). Patient states last night went ok. The hardest part was to keep face down. Took tylenol X 2 yesterday and one today. No eye pain. Felt scratchy yesterday. Not today. va blurry.        Last edited by Bernarda Caffey, MD on 11/05/2018  1:43 AM. (History)    pt states she is doing well, she states her eye feels scratchy, she states she had to take some tylenol for pain last night, she is able to maintain head down position as instructed  Referring physician: Tammi Sou, MD [address] [place],  [place] [address]  HISTORICAL INFORMATION:   Selected notes from the MEDICAL RECORD NUMBER Referred for DM exam LEE:  Ocular Hx-NPDR PMH-DM (takes invokana and metformin), high cholesterol, benign brain tumor, white coat HTN    CURRENT MEDICATIONS: Current Outpatient Medications (Ophthalmic Drugs)  Medication Sig  . brimonidine (ALPHAGAN) 0.15 % ophthalmic solution Place 1 drop into the right eye 2 (two) times daily. (Patient taking differently: Place 1 drop into both eyes at bedtime. )  . Difluprednate (DUREZOL) 0.05 % EMUL Place 1 drop into the right eye 4 (four) times daily. (Patient not taking: Reported on 10/28/2018)  . prednisoLONE acetate (PRED FORTE) 1 % ophthalmic suspension Place 1 drop into the right eye every hour while awake. (Patient not taking: Reported on 10/28/2018)  . prednisoLONE acetate (PRED FORTE) 1 % ophthalmic  suspension Place 1 drop into the left eye 4 (four) times daily. Place 1 drop into the left eye 4 times daily for 7 days (Patient not taking: Reported on 10/28/2018)   No current facility-administered medications for this visit.  (Ophthalmic Drugs)   Current Outpatient Medications (Other)  Medication Sig  . APPLE CIDER VINEGAR PO Take 2 tablets by mouth daily.  Marland Kitchen glipiZIDE (GLUCOTROL XL) 10 MG 24 hr tablet TAKE 1 TABLET BY MOUTH WITH BREAKFAST (Patient taking differently: Take 10 mg by mouth daily with breakfast. )  . HYDROcodone-acetaminophen (NORCO/VICODIN) 5-325 MG tablet Take 1-2 tablets by mouth every 6 (six) hours as needed for moderate pain. (Patient taking differently: Take 1 tablet by mouth every 12 (twelve) hours as needed for moderate pain. )  . INVOKANA 300 MG TABS tablet TAKE 1 TABLET BY MOUTH ONCE DAILY BEFORE BREAKFAST (Patient taking differently: Take 300 mg by mouth daily before breakfast. )  . losartan (COZAAR) 100 MG tablet Take 1 tablet by mouth once daily (Patient taking differently: Take 100 mg by mouth daily. )  . metFORMIN (GLUCOPHAGE-XR) 500 MG 24 hr tablet Take 2 tablets (1,000 mg total) by mouth 2 (two) times daily. (Patient not taking: Reported on 10/28/2018)  . metoprolol succinate (TOPROL-XL) 100 MG 24 hr tablet Take 1 tablet (100 mg total) by mouth daily. Take with or immediately following a  meal.  . Multiple Vitamin (MULTIVITAMIN WITH MINERALS) TABS tablet Take 1 tablet by mouth daily.  Marland Kitchen OVER THE COUNTER MEDICATION Take 2 tablets by mouth daily. Neu Remedy  . pantoprazole (PROTONIX) 40 MG tablet Take 1 tablet by mouth once daily (Patient taking differently: Take 40 mg by mouth daily. )  . promethazine (PHENERGAN) 12.5 MG tablet 1-2 tabs po q6h prn nausea (Patient not taking: Reported on 10/28/2018)  . rosuvastatin (CRESTOR) 10 MG tablet Take 1 tablet (10 mg total) by mouth daily. (Patient not taking: Reported on 10/28/2018)  . sitaGLIPtin-metformin (JANUMET) 50-1000 MG  tablet Take 1 tablet by mouth 2 (two) times daily with a meal.  . temazepam (RESTORIL) 15 MG capsule Take 1 capsule (15 mg total) by mouth at bedtime as needed for sleep. (Patient not taking: Reported on 10/28/2018)   No current facility-administered medications for this visit.  (Other)      REVIEW OF SYSTEMS: ROS    Positive for: Endocrine, Eyes   Negative for: Constitutional, Gastrointestinal, Neurological, Skin, Genitourinary, Musculoskeletal, HENT, Cardiovascular, Respiratory, Psychiatric, Allergic/Imm, Heme/Lymph   Last edited by Theodore Demark, COA on 11/01/2018  9:51 AM. (History)       ALLERGIES Allergies  Allergen Reactions  . Gluten Meal Swelling  . Lisinopril Cough  . Pioglitazone Other (See Comments)    ELEVATED glucoses + worse chronic nausea    PAST MEDICAL HISTORY Past Medical History:  Diagnosis Date  . Abdominal bloating    likely from diab gastroparesis.  Dr. Havery Moros to do EGD as of 10/2017 GI eval.  . Benign brain tumor ([place])    Cystic lesion in cerebral aqueduct region with mild hydrocephalus-- stable MRI 02/2016.  Surveillance MRI 05/2017 --dilated cerebral aqueduct related to aqueductal stenosis and subsequent mild hydrocephalus (due to the 11 mm stable cystic lesion in cerebral aqueduct---?congenitial?.  . Cataract    OU  . Dysmenorrhea    vicodin occ during first 2 days of cycle.  . Gluten intolerance    pt reports she underwent full GI w/u to r/o celiac dz  . Hepatic steatosis    ultrasound 08/2017  . Hyperlipidemia, mixed   . Hypertensive retinopathy    OU  . Hypertensive retinopathy of both eyes   . Insomnia   . Proliferative diabetic retinopathy of both eyes ([place])    steroid injections 10/2017--improved  . Sensorineural hearing loss of left ear    Sudden left hearing loss summer 2016--no improvement with steroids 01/2015 so brain MRI done by Dr. Redmond Baseman and it showed brain tumor that was determined to be benign.  Pt's hearing not bad enough for  hearing aid as of 06/2016.  . Type 2 diabetes with complication ([place])    diab retpthy, diabetic gastroparesis (gastric emptying study mildly abnl 03/2017).  Recommended lantus 08/2018 but pt declined.  . White coat hypertension    Past Surgical History:  Procedure Laterality Date  . CHOLECYSTECTOMY  2000  . GAS INSERTION Right 10/31/2018   Procedure: Insertion Of C3F8 Gas;  Surgeon: Bernarda Caffey, MD;  Location: [facility];  Service: Ophthalmology;  Laterality: Right;  . GASTRIC EMPTYING SCAN  04/20/2017   Mildly abnormal, particularly the 1st hour of emptying.  Marland Kitchen MEMBRANE PEEL Right 10/31/2018   Procedure: MEMBRANE PEEL;  Surgeon: Bernarda Caffey, MD;  Location: [facility];  Service: Ophthalmology;  Laterality: Right;  . PARS PLANA VITRECTOMY Right 04/12/2018   Procedure: Right PARS PLANA VITRECTOMY WITH 25 GAUGE with intravitreal antibiotics;  Surgeon: Bernarda Caffey, MD;  Location: [facility];  Service: Ophthalmology;  Laterality: Right;  . PARS PLANA VITRECTOMY Right 10/31/2018   Procedure: PARS PLANA VITRECTOMY WITH 25 GAUGE;  Surgeon: Bernarda Caffey, MD;  Location: [facility];  Service: Ophthalmology;  Laterality: Right;  . PHOTOCOAGULATION WITH LASER Right 10/31/2018   Procedure: Photocoagulation With Laser;  Surgeon: Bernarda Caffey, MD;  Location: [facility];  Service: Ophthalmology;  Laterality: Right;    FAMILY HISTORY Family History  Problem Relation Age of Onset  . Brain cancer Mother   . Diabetes Father   . Diabetes Maternal Grandmother   . Cataracts Maternal Grandmother   . Cervical cancer Paternal Grandmother   . Colon cancer Maternal Grandfather 27  . Amblyopia Neg Hx   . Blindness Neg Hx   . Glaucoma Neg Hx   . Macular degeneration Neg Hx   . Retinal detachment Neg Hx   . Strabismus Neg Hx   . Retinitis pigmentosa Neg Hx     SOCIAL HISTORY Social History   Tobacco Use  . Smoking status: Never Smoker  . Smokeless tobacco: Never Used  Substance Use Topics  . Alcohol use: No  . Drug use: No          OPHTHALMIC EXAM:  Base Eye Exam    Visual Acuity (Snellen - Linear)      Right Left   Dist [place] HM 2'    Dist cc  20/25   Dist ph [place] NI    Dist ph cc  20/20 -2   Correction: Glasses       Tonometry (Tonopen, 9:46 AM)      Right Left   Pressure 16 def       Pupils   OD is Dilated.       Neuro/Psych    Oriented x3: Yes   Mood/Affect: Normal       Dilation    Right eye: 1.0% Mydriacyl, 2.5% Phenylephrine @ 9:46 AM        Slit Lamp and Fundus Exam    Slit Lamp Exam      Right Left   Lids/Lashes Dermatochalasis - upper lid, Meibomian gland dysfunction, Telangiectasia Dermatochalasis - upper lid, Meibomian gland dysfunction, Telangiectasia   Conjunctiva/Sclera Minimal Subconjunctival hemorrhage, sutures intact White and quiet   Cornea Clear, trace Punctate epithelial erosions Trace Punctate epithelial erosions   Anterior Chamber moderate depth Deep and quiet   Iris Round and dilated, No NVI Round and dilated, No NVI   Lens 2+ Nuclear sclerosis, 2+ Cortical cataract, trace PSC 2+ Nuclear sclerosis, 2+ Cortical cataract   Vitreous post vitrectomy; good gas fill Vitreous syneresis, diffuse hemorrhage clearing centrally and settling inferiorly       Fundus Exam      Right Left   Disc Pink and Sharp, mild fibrosis superiorly with fine NVD and traction extending superiorly and along ST arcade - improved and flat under gas    C/D Ratio 0.1 0.1   Macula Flat, Blunted foveal reflex, mild punctate MA and exudates greatest superior macula    Vessels mild Vascular attenuation, tractional fibrosis along proximal ST arcades improved -- flat under gas    Periphery Attached, tractional fibrosis SN quadrant -- gone today, scattered DBH, 360 PRP, good laser fill in 360         Refraction    Wearing Rx      Sphere Cylinder   Right -3.75 Sphere   Left -3.75 Sphere          IMAGING AND PROCEDURES  Imaging and Procedures for @TODAY @          ASSESSMENT/PLAN:     ICD-10-CM   1. Proliferative diabetic retinopathy of both eyes with macular edema associated with type 2 diabetes mellitus ([place])  [id number]   2. Retinal edema  H35.81   3. Vitreous hemorrhage of left eye (H[facility])  H43.12   4. Right endophthalmia  H44.001   5. Vitreous hemorrhage, right eye ([place])  H43.11   6. Essential hypertension  I10   7. Hypertensive retinopathy of both eyes  H35.033   8. Combined forms of age-related cataract of both eyes  H25.813     1,2.  Proliferative diabetic retinopathy w/ DME, OU  - HbA1c 8.6% (06.30.20) from 8.4 (02.14.20), but pt reports better control of BG with recent medication adjustments -- added Janumet  - s/p IVA OD #1 9.20.19, #2 (10.25.19), #3 (11.15.19), #4 (12.17.19), #5 (01.14.20), #6 (2.11.20), #7 (05.29.20), #8 (08.19.20)  - s/p IVA OS #1 9.27.19, #2 (10.25.19), #3 (11.15.19), #4 (12.17.19), #5 (01.14.20), #6 (2.11.20), #7 (04.26.20), #8 (05.29.20), #9 (06.26.20), # 10 (08.05.20)  - S/P PRP OS (09.20.19), (5.19.20), (08.19.20)  - S/P PRP OD (9.27.19 and 11.21.19), fill-in (04.14.20)  - FA (9.20.19) shows +NVE OU and leaking MA and capillary nonperfusion  - repeat FA 11.15.19 shows NV regressing OU  - exam shows improving VH OS -- OCT with mild DME OU  - OD w/ VA stable at 20/25, but there is some preretinal fibrosis / tractional membranes just superior to disc and mild central DME  - now s/p 25g PPV+MP+10% C3F8 gas (09.03.20)             - doing well this morning             - fibrosis/ERM improved; retina attached, and good gas bubble in place             - IOP ok             - start PF 4x/day OD    zymaxid QID OD   Atropine BID OD    Brimonidine BID OD                         PSO ung QID OD              - cont face down positioning x3 days then can decrease positioning to 50% of time; avoid laying flat on back              - eye shield when sleeping              - post op drop and positioning instructions reviewed              -  tylenol/ibuprofen for pain  - f/u 1 week -- POV  3. Vitreous hemorrhage OS  - new onset 4.24.20  - etiology: secondary to PDR as described below (no RT/RD on exam)  - s/p IVA OS on 4.26.20, 5.29.20, 6.26.20 and now 8.5.20  - today, VH improved with just mild residual VH settled inferiorly -- center clear  - pt reports recent change in BP/DM meds   - BCVA stable at 20/25   - s/p PRP OS (9.20.19), (05.19.20), (08.19.0)  4. Endophthalmitis OD  - s/p IVA OU 04/09/2018  - s/p 25g PPV w/ intravitreal vanc, ceftaz and cefepime OD, 2.14.2020  - s/p intravitreal tap / vanc and ceflaz injections (02.16.20)             -  gram stain (2.14.20) shows G+ cocci, WBCs mostly PMNs;   - repeat gram stain from t/i (2.16.20) -- no organisms, just WBCs             - cultures from vitreous grew rare Staph warneri; cultures from t/i -- no growth             - doing well, BCVA 20/25             - inflammation/posterior debris essentially resolved now  - corneal edema resolved             - IOP 16 off Brimonidine  - completed po pred taper -- caused significant elevations in BG  - monitor  5. History of Vitreous Hemorrhage OD -- cleared from PPV x2 for endophthalmitis and ERM/preretinal fibrosis  - secondary to PDR as described below  - S/P IVA OD #1 (09.20.19), #2 (10.25.19), #3 (11.15.19), #4 (12.16.19), #5 (03/12/2018)  - S/P PRP OD #1 (09.27.19), fill in OD (11.21.19) -- each somewhat limited inferiorly by residual VH  6,7. Hypertensive retinopathy OU  - discussed importance of tight BP control.  - monitor for now  8. Combined form age related cataract OU-   - The symptoms of cataract, surgical options, and treatments and risks were discussed with patient.  - discussed diagnosis and progression  - not yet visually significant  - monitor for now   Ophthalmic Meds Ordered this visit:  No orders of the defined types were placed in this encounter.      Return in about 1 week (around 11/08/2018)  for f/u s/p PPV + MP -- 1 week POV.  There are no Patient Instructions on file for this visit.   Explained the diagnoses, plan, and follow up with the patient and they expressed understanding.  Patient expressed understanding of the importance of proper follow up care.   This document serves as a record of services personally performed by Gardiner Sleeper, MD, PhD. It was created on their behalf by Ernest Mallick, OA, an ophthalmic assistant. The creation of this record is the provider's dictation and/or activities during the visit.    Electronically signed by: Ernest Mallick, OA  09.04.2020 1:49 AM    Gardiner Sleeper, M.D., Ph.D. Diseases & Surgery of the Retina and Vitreous [facility]   I have reviewed the above documentation for accuracy and completeness, and I agree with the above. Gardiner Sleeper, M.D., Ph.D. 11/05/18 1:49 AM     Abbreviations: M myopia (nearsighted); A astigmatism; H hyperopia (farsighted); P presbyopia; Mrx spectacle prescription;  CTL contact lenses; OD right eye; OS left eye; OU both eyes  XT exotropia; ET esotropia; PEK punctate epithelial keratitis; PEE punctate epithelial erosions; DES dry eye syndrome; MGD meibomian gland dysfunction; ATs artificial tears; PFAT's preservative free artificial tears; [place] nuclear sclerotic cataract; PSC posterior subcapsular cataract; ERM epi-retinal membrane; PVD posterior vitreous detachment; RD retinal detachment; DM diabetes mellitus; DR diabetic retinopathy; NPDR non-proliferative diabetic retinopathy; PDR proliferative diabetic retinopathy; CSME clinically significant macular edema; DME diabetic macular edema; dbh dot blot hemorrhages; CWS cotton wool spot; POAG primary open angle glaucoma; C/D cup-to-disc ratio; HVF humphrey visual field; GVF goldmann visual field; OCT optical coherence tomography; IOP intraocular pressure; BRVO Branch retinal vein occlusion; CRVO central retinal vein occlusion; CRAO central  retinal artery occlusion; BRAO branch retinal artery occlusion; RT retinal tear; SB scleral buckle; PPV pars plana vitrectomy; VH Vitreous hemorrhage; PRP panretinal laser photocoagulation; IVK intravitreal kenalog;  VMT vitreomacular traction; MH Macular hole;  NVD neovascularization of the disc; NVE neovascularization elsewhere; AREDS age related eye disease study; ARMD age related macular degeneration; POAG primary open angle glaucoma; EBMD epithelial/anterior basement membrane dystrophy; ACIOL anterior chamber intraocular lens; IOL intraocular lens; PCIOL posterior chamber intraocular lens; Phaco/IOL phacoemulsification with intraocular lens placement; [place] photorefractive keratectomy; LASIK laser assisted in situ keratomileusis; HTN hypertension; DM diabetes mellitus; COPD chronic obstructive pulmonary disease

## 2018-10-31 NOTE — Discharge Instructions (Addendum)
POSTOPERATIVE INSTRUCTIONS  Your doctor has performed vitreoretinal surgery on you at [facility]. [place] eye patched and shielded until seen by Dr. Coralyn Pear 930 AM tomorrow in clinic - Do not use drops until return - [place] - Sleep with belly down or on left side, avoid laying flat on back.    - No strenuous bending, stooping or lifting.  - You may not drive until further notice.  - If your doctor used a gas bubble in your eye during the procedure he will advise you on postoperative positioning. If you have a gas bubble you will be wearing a green bracelet that was applied in the operating room. The green bracelet should stay on as long as the gas bubble is in your eye. While the gas bubble is present you should not fly in an airplane. If you require general anesthesia while the gas bubble is present you must notify your anesthesiologist that an intraocular gas bubble is present so he can take the appropriate precautions.  - Tylenol or any other over-the-counter pain reliever can be used according to your doctor. If more pain medicine is required, your doctor will have a prescription for you.  - You may read, go up and down stairs, and watch television.     Bernarda Caffey, M.D., Ph.D.

## 2018-10-31 NOTE — Anesthesia Procedure Notes (Signed)
Procedure Name: Intubation Date/Time: 10/31/2018 10:56 AM Performed by: Alain [place], CRNA Pre-anesthesia Checklist: Patient identified, Emergency Drugs available, Suction available and Patient being monitored Patient Re-evaluated:Patient Re-evaluated prior to induction Oxygen Delivery Method: Circle System Utilized Preoxygenation: Pre-oxygenation with 100% oxygen Induction Type: IV induction and Rapid sequence Laryngoscope Size: Miller and 2 Grade View: Grade I Tube type: Oral Tube size: 7.0 mm Number of attempts: 1 Airway Equipment and Method: Stylet Placement Confirmation: ETT inserted through vocal cords under direct vision,  positive ETCO2 and breath sounds checked- equal and bilateral Secured at: 21 cm Tube secured with: Tape Dental Injury: Teeth and Oropharynx as per pre-operative assessment

## 2018-10-31 NOTE — Progress Notes (Signed)
Per Dr Conrad [place], No CMP blood draw needed.

## 2018-11-01 ENCOUNTER — Encounter (INDEPENDENT_AMBULATORY_CARE_PROVIDER_SITE_OTHER): Payer: Self-pay | Admitting: Ophthalmology

## 2018-11-01 ENCOUNTER — Ambulatory Visit (INDEPENDENT_AMBULATORY_CARE_PROVIDER_SITE_OTHER): Payer: BLUE CROSS/BLUE SHIELD | Admitting: Ophthalmology

## 2018-11-01 DIAGNOSIS — I10 Essential (primary) hypertension: Secondary | ICD-10-CM

## 2018-11-01 DIAGNOSIS — H44001 Unspecified purulent endophthalmitis, right eye: Secondary | ICD-10-CM

## 2018-11-01 DIAGNOSIS — H35033 Hypertensive retinopathy, bilateral: Secondary | ICD-10-CM

## 2018-11-01 DIAGNOSIS — E113513 Type 2 diabetes mellitus with proliferative diabetic retinopathy with macular edema, bilateral: Secondary | ICD-10-CM

## 2018-11-01 DIAGNOSIS — H4312 Vitreous hemorrhage, left eye: Secondary | ICD-10-CM

## 2018-11-01 DIAGNOSIS — H3581 Retinal edema: Secondary | ICD-10-CM

## 2018-11-01 DIAGNOSIS — H25813 Combined forms of age-related cataract, bilateral: Secondary | ICD-10-CM

## 2018-11-01 DIAGNOSIS — H4311 Vitreous hemorrhage, right eye: Secondary | ICD-10-CM

## 2018-11-05 ENCOUNTER — Encounter (INDEPENDENT_AMBULATORY_CARE_PROVIDER_SITE_OTHER): Payer: Self-pay | Admitting: Ophthalmology

## 2018-11-06 NOTE — Progress Notes (Addendum)
[facility] Note  11/08/2018     CHIEF COMPLAINT Patient presents for Post-op Follow-up   HISTORY OF PRESENT ILLNESS: Gloria Gomez is a 48 y.o. female who presents to the clinic today for:   HPI    Post-op Follow-up    In right eye.  Discomfort includes Negative for pain, itching, foreign body sensation, tearing, discharge, floaters and none.  Vision is blurred at distance and is blurred at near.  I, the attending physician,  performed the HPI with the patient and updated documentation appropriately.          Comments    Patient states vision still blurred OD. No eye pain. Using gtts/ung as instructed.        Last edited by Bernarda Caffey, MD on 11/08/2018 10:17 AM. (History)    pt states she maintained 50 mins/hr face down time over the weekend and then Monday went to 30 mins/hr, she states her eye is not scratchy so she has only been using the ointment at night time  Referring physician: Tammi Sou, MD [address] [place],  [place] [address]  HISTORICAL INFORMATION:   Selected notes from the MEDICAL RECORD NUMBER Referred for DM exam LEE:  Ocular Hx-NPDR PMH-DM (takes invokana and metformin), high cholesterol, benign brain tumor, white coat HTN    CURRENT MEDICATIONS: Current Outpatient Medications (Ophthalmic Drugs)  Medication Sig  . atropine 1 % ophthalmic solution Place 1 drop into the right eye 2 (two) times daily.  . bacitracin-polymyxin b (POLYSPORIN) ophthalmic ointment Place 1 application into the right eye 4 (four) times daily. apply to eye every 12 hours while awake  . brimonidine (ALPHAGAN) 0.15 % ophthalmic solution Place 1 drop into the right eye 2 (two) times daily. (Patient taking differently: Place 1 drop into both eyes at bedtime. )  . gatifloxacin (ZYMAXID) 0.5 % SOLN Place 1 drop into the right eye 4 (four) times daily.  . prednisoLONE acetate (PRED FORTE) 1 % ophthalmic suspension Place 1 drop into the  right eye every hour while awake. (Patient taking differently: Place 1 drop into the right eye 4 (four) times daily. )  . Difluprednate (DUREZOL) 0.05 % EMUL Place 1 drop into the right eye 4 (four) times daily. (Patient not taking: Reported on 10/28/2018)  . prednisoLONE acetate (PRED FORTE) 1 % ophthalmic suspension Place 1 drop into the left eye 4 (four) times daily. Place 1 drop into the left eye 4 times daily for 7 days (Patient not taking: Reported on 10/28/2018)   No current facility-administered medications for this visit.  (Ophthalmic Drugs)   Current Outpatient Medications (Other)  Medication Sig  . APPLE CIDER VINEGAR PO Take 2 tablets by mouth daily.  Marland Kitchen glipiZIDE (GLUCOTROL XL) 10 MG 24 hr tablet TAKE 1 TABLET BY MOUTH WITH BREAKFAST (Patient taking differently: Take 10 mg by mouth daily with breakfast. )  . HYDROcodone-acetaminophen (NORCO/VICODIN) 5-325 MG tablet Take 1-2 tablets by mouth every 6 (six) hours as needed for moderate pain. (Patient taking differently: Take 1 tablet by mouth every 12 (twelve) hours as needed for moderate pain. )  . INVOKANA 300 MG TABS tablet TAKE 1 TABLET BY MOUTH ONCE DAILY BEFORE BREAKFAST (Patient taking differently: Take 300 mg by mouth daily before breakfast. )  . losartan (COZAAR) 100 MG tablet Take 1 tablet by mouth once daily (Patient taking differently: Take 100 mg by mouth daily. )  . metoprolol succinate (TOPROL-XL) 100 MG 24  hr tablet Take 1 tablet (100 mg total) by mouth daily. Take with or immediately following a meal.  . Multiple Vitamin (MULTIVITAMIN WITH MINERALS) TABS tablet Take 1 tablet by mouth daily.  Marland Kitchen OVER THE COUNTER MEDICATION Take 2 tablets by mouth daily. Neu Remedy  . pantoprazole (PROTONIX) 40 MG tablet Take 1 tablet by mouth once daily (Patient taking differently: Take 40 mg by mouth daily. )  . sitaGLIPtin-metformin (JANUMET) 50-1000 MG tablet Take 1 tablet by mouth 2 (two) times daily with a meal.  . metFORMIN  (GLUCOPHAGE-XR) 500 MG 24 hr tablet Take 2 tablets (1,000 mg total) by mouth 2 (two) times daily. (Patient not taking: Reported on 10/28/2018)  . promethazine (PHENERGAN) 12.5 MG tablet 1-2 tabs po q6h prn nausea (Patient not taking: Reported on 10/28/2018)  . rosuvastatin (CRESTOR) 10 MG tablet Take 1 tablet (10 mg total) by mouth daily. (Patient not taking: Reported on 10/28/2018)  . temazepam (RESTORIL) 15 MG capsule Take 1 capsule (15 mg total) by mouth at bedtime as needed for sleep. (Patient not taking: Reported on 10/28/2018)   No current facility-administered medications for this visit.  (Other)      REVIEW OF SYSTEMS: ROS    Positive for: Endocrine, Eyes   Negative for: Constitutional, Gastrointestinal, Neurological, Skin, Genitourinary, Musculoskeletal, HENT, Cardiovascular, Respiratory, Psychiatric, Allergic/Imm, Heme/Lymph   Last edited by Roselee Nova D, COT on 11/08/2018  9:22 AM. (History)       ALLERGIES Allergies  Allergen Reactions  . Gluten Meal Swelling  . Lisinopril Cough  . Pioglitazone Other (See Comments)    ELEVATED glucoses + worse chronic nausea    PAST MEDICAL HISTORY Past Medical History:  Diagnosis Date  . Abdominal bloating    likely from diab gastroparesis.  Dr. Havery Moros to do EGD as of 10/2017 GI eval.  . Benign brain tumor ([place])    Cystic lesion in cerebral aqueduct region with mild hydrocephalus-- stable MRI 02/2016.  Surveillance MRI 05/2017 --dilated cerebral aqueduct related to aqueductal stenosis and subsequent mild hydrocephalus (due to the 11 mm stable cystic lesion in cerebral aqueduct---?congenitial?.  . Cataract    OU  . Dysmenorrhea    vicodin occ during first 2 days of cycle.  . Gluten intolerance    pt reports she underwent full GI w/u to r/o celiac dz  . Hepatic steatosis    ultrasound 08/2017  . Hyperlipidemia, mixed   . Hypertensive retinopathy    OU  . Hypertensive retinopathy of both eyes   . Insomnia   . Proliferative  diabetic retinopathy of both eyes ([place])    steroid injections 10/2017--improved  . Sensorineural hearing loss of left ear    Sudden left hearing loss summer 2016--no improvement with steroids 01/2015 so brain MRI done by Dr. Redmond Baseman and it showed brain tumor that was determined to be benign.  Pt's hearing not bad enough for hearing aid as of 06/2016.  . Type 2 diabetes with complication ([place])    diab retpthy, diabetic gastroparesis (gastric emptying study mildly abnl 03/2017).  Recommended lantus 08/2018 but pt declined.  . White coat hypertension    Past Surgical History:  Procedure Laterality Date  . CHOLECYSTECTOMY  2000  . GAS INSERTION Right 10/31/2018   Procedure: Insertion Of C3F8 Gas;  Surgeon: Bernarda Caffey, MD;  Location: [facility];  Service: Ophthalmology;  Laterality: Right;  . GASTRIC EMPTYING SCAN  04/20/2017   Mildly abnormal, particularly the 1st hour of emptying.  Marland Kitchen MEMBRANE PEEL Right 10/31/2018  Procedure: MEMBRANE PEEL;  Surgeon: Bernarda Caffey, MD;  Location: [facility];  Service: Ophthalmology;  Laterality: Right;  . PARS PLANA VITRECTOMY Right 04/12/2018   Procedure: Right PARS PLANA VITRECTOMY WITH 25 GAUGE with intravitreal antibiotics;  Surgeon: Bernarda Caffey, MD;  Location: [facility];  Service: Ophthalmology;  Laterality: Right;  . PARS PLANA VITRECTOMY Right 10/31/2018   Procedure: PARS PLANA VITRECTOMY WITH 25 GAUGE;  Surgeon: Bernarda Caffey, MD;  Location: [facility];  Service: Ophthalmology;  Laterality: Right;  . PHOTOCOAGULATION WITH LASER Right 10/31/2018   Procedure: Photocoagulation With Laser;  Surgeon: Bernarda Caffey, MD;  Location: [facility];  Service: Ophthalmology;  Laterality: Right;    FAMILY HISTORY Family History  Problem Relation Age of Onset  . Brain cancer Mother   . Diabetes Father   . Diabetes Maternal Grandmother   . Cataracts Maternal Grandmother   . Cervical cancer Paternal Grandmother   . Colon cancer Maternal Grandfather 23  . Amblyopia Neg Hx   . Blindness Neg Hx    . Glaucoma Neg Hx   . Macular degeneration Neg Hx   . Retinal detachment Neg Hx   . Strabismus Neg Hx   . Retinitis pigmentosa Neg Hx     SOCIAL HISTORY Social History   Tobacco Use  . Smoking status: Never Smoker  . Smokeless tobacco: Never Used  Substance Use Topics  . Alcohol use: No  . Drug use: No         OPHTHALMIC EXAM:  Base Eye Exam    Visual Acuity (Snellen - Linear)      Right Left   Dist cc CF at face 20/25   Dist ph cc NI NI       Tonometry (Tonopen, 9:34 AM)      Right Left   Pressure 12 16       Pupils      Dark Light Shape React APD   Right 7 7 Round none None   Left 5 4.5 Round Minimal None       Extraocular Movement      Right Left    Full, Ortho Full, Ortho       Neuro/Psych    Oriented x3: Yes   Mood/Affect: Normal       Dilation    Right eye: 1.0% Mydriacyl, 2.5% Phenylephrine @ 9:34 AM        Slit Lamp and Fundus Exam    Slit Lamp Exam      Right Left   Lids/Lashes Dermatochalasis - upper lid, Meibomian gland dysfunction, Telangiectasia Dermatochalasis - upper lid, Meibomian gland dysfunction, Telangiectasia   Conjunctiva/Sclera White and quiet, sutures intact White and quiet   Cornea Clear, trace Punctate epithelial erosions Trace Punctate epithelial erosions   Anterior Chamber moderate depth, no cell or flare Deep and quiet   Iris Round and dilated, No NVI Round and dilated, No NVI   Lens 2+ Nuclear sclerosis, 2+ Cortical cataract, 1+PSC 2+ Nuclear sclerosis, 2+ Cortical cataract   Vitreous post vitrectomy; 55-60% gas fill Vitreous syneresis, diffuse hemorrhage clearing centrally and settling inferiorly       Fundus Exam      Right Left   Disc Pink and Sharp, improved fibrosis     C/D Ratio 0.1 0.1   Macula Flat, Blunted foveal reflex, mild punctate MA and exudates greatest superior macula    Vessels mild Vascular attenuation, tractional fibrosis along proximal ST arcades removed -- flat under gas    Periphery  Attached, tractional fibrosis  SN quadrant gone and flat under gas, scattered DBH, 360 PRP, good laser fill in 360         Refraction    Wearing Rx      Sphere Cylinder   Right -3.75 Sphere   Left -3.75 Sphere          IMAGING AND PROCEDURES  Imaging and Procedures for @TODAY @          ASSESSMENT/PLAN:    ICD-10-CM   1. Proliferative diabetic retinopathy of both eyes with macular edema associated with type 2 diabetes mellitus ([place])  [id number]   2. Retinal edema  H35.81 CANCELED: OCT, Retina - OU - Both Eyes  3. Vitreous hemorrhage of left eye ([facility])  H43.12   4. Right endophthalmia  H44.001   5. Vitreous hemorrhage, right eye ([place])  H43.11   6. Essential hypertension  I10   7. Hypertensive retinopathy of both eyes  H35.033   8. Combined forms of age-related cataract of both eyes  H25.813     1,2.  Proliferative diabetic retinopathy w/ DME, OU  - HbA1c 8.6% (06.30.20) from 8.4 (02.14.20), but pt reports better control of BG with recent medication adjustments -- added Janumet  - s/p IVA OD #1 9.20.19, #2 (10.25.19), #3 (11.15.19), #4 (12.17.19), #5 (01.14.20), #6 (2.11.20), #7 (05.29.20), #8 (08.19.20)  - s/p IVA OS #1 9.27.19, #2 (10.25.19), #3 (11.15.19), #4 (12.17.19), #5 (01.14.20), #6 (2.11.20), #7 (04.26.20), #8 (05.29.20), #9 (06.26.20), # 10 (08.05.20)  - S/P PRP OS (09.20.19), (5.19.20), (08.19.20)  - S/P PRP OD (9.27.19 and 11.21.19), fill-in (04.14.20)  - FA (9.20.19) shows +NVE OU and leaking MA and capillary nonperfusion  - repeat FA 11.15.19 shows NV regressing OU  - pre-op: OD w/ VA stable at 20/25, but there is some preretinal fibrosis / tractional membranes just superior to disc and mild central DME  - now s/p 25g PPV+MP+10% C3F8 gas OD (09.03.20) -- ERM/PRF removal OD             - doing well              - fibrosis/ERM improved; retina attached  - gas bubble ~55-60%             - IOP okay at 16             - continue PF 4x/day OD    zymaxid QID OD --  stop when bottle runs out   Atropine BID OD -- okay to stop   Brimonidine BID OD -- decrease to once a day                         PSO ung QID OD -- use at night / PRN             - avoid laying flat on back              - eye shield when sleeping              - post op drop and positioning instructions reviewed              - tylenol/ibuprofen for pain  - f/u 3 weeks -- POV  3. Vitreous hemorrhage OS  - new onset 4.24.20  - etiology: secondary to PDR as described below (no RT/RD on exam)  - s/p IVA OS on 4.26.20, 5.29.20, 6.26.20 and now 8.5.20  - today, VH improved with just mild residual VH settled inferiorly --  center clear  - pt reports recent change in BP/DM meds   - BCVA stable at 20/25   - s/p PRP OS (9.20.19), (05.19.20), (08.19.0)  4. History of Endophthalmitis OD  - s/p IVA OU 04/09/2018  - s/p 25g PPV w/ intravitreal vanc, ceftaz and cefepime OD, 2.14.2020  - s/p intravitreal tap / vanc and ceflaz injections (02.16.20)             - gram stain (2.14.20) shows G+ cocci, WBCs mostly PMNs;   - repeat gram stain from t/i (2.16.20) -- no organisms, just WBCs             - cultures from vitreous grew rare Staph warneri; cultures from t/i -- no growth             - doing well, BCVA 20/25             - inflammation/posterior debris resolved             - IOP 16 off Brimonidine  - completed po pred taper -- caused significant elevations in BG  - monitor  5. History of Vitreous Hemorrhage OD -- cleared from PPV x2 for endophthalmitis and ERM/preretinal fibrosis  - secondary to PDR as described below  - S/P IVA OD #1 (09.20.19), #2 (10.25.19), #3 (11.15.19), #4 (12.16.19), #5 (03/12/2018)  - S/P PRP OD #1 (09.27.19), fill in OD (11.21.19) -- each somewhat limited inferiorly by residual VH  6,7. Hypertensive retinopathy OU  - discussed importance of tight BP control.  - monitor for now  8. Combined form age related cataract OU-   - The symptoms of cataract, surgical options,  and treatments and risks were discussed with patient.  - discussed diagnosis and progression  - not yet visually significant  - monitor for now   Ophthalmic Meds Ordered this visit:  No orders of the defined types were placed in this encounter.      Return in about 3 weeks (around 11/29/2018) for s/p PPV+MP, DFE, OCT.  There are no Patient Instructions on file for this visit.   Explained the diagnoses, plan, and follow up with the patient and they expressed understanding.  Patient expressed understanding of the importance of proper follow up care.   This document serves as a record of services personally performed by Gardiner Sleeper, MD, PhD. It was created on their behalf by Roselee Nova, COMT. The creation of this record is the provider's dictation and/or activities during the visit.  Electronically signed by: Roselee Nova, COMT 11/09/18 12:46 AM   Gardiner Sleeper, M.D., Ph.D. Diseases & Surgery of the Retina and Vitreous [facility]  I have reviewed the above documentation for accuracy and completeness, and I agree with the above. Gardiner Sleeper, M.D., Ph.D. 11/09/18 12:46 AM   Abbreviations: M myopia (nearsighted); A astigmatism; H hyperopia (farsighted); P presbyopia; Mrx spectacle prescription;  CTL contact lenses; OD right eye; OS left eye; OU both eyes  XT exotropia; ET esotropia; PEK punctate epithelial keratitis; PEE punctate epithelial erosions; DES dry eye syndrome; MGD meibomian gland dysfunction; ATs artificial tears; PFAT's preservative free artificial tears; [place] nuclear sclerotic cataract; PSC posterior subcapsular cataract; ERM epi-retinal membrane; PVD posterior vitreous detachment; RD retinal detachment; DM diabetes mellitus; DR diabetic retinopathy; NPDR non-proliferative diabetic retinopathy; PDR proliferative diabetic retinopathy; CSME clinically significant macular edema; DME diabetic macular edema; dbh dot blot hemorrhages; CWS cotton  wool spot; POAG primary open angle glaucoma; C/D cup-to-disc ratio; HVF  humphrey visual field; GVF goldmann visual field; OCT optical coherence tomography; IOP intraocular pressure; BRVO Branch retinal vein occlusion; CRVO central retinal vein occlusion; CRAO central retinal artery occlusion; BRAO branch retinal artery occlusion; RT retinal tear; SB scleral buckle; PPV pars plana vitrectomy; VH Vitreous hemorrhage; PRP panretinal laser photocoagulation; IVK intravitreal kenalog; VMT vitreomacular traction; MH Macular hole;  NVD neovascularization of the disc; NVE neovascularization elsewhere; AREDS age related eye disease study; ARMD age related macular degeneration; POAG primary open angle glaucoma; EBMD epithelial/anterior basement membrane dystrophy; ACIOL anterior chamber intraocular lens; IOL intraocular lens; PCIOL posterior chamber intraocular lens; Phaco/IOL phacoemulsification with intraocular lens placement; [place] photorefractive keratectomy; LASIK laser assisted in situ keratomileusis; HTN hypertension; DM diabetes mellitus; COPD chronic obstructive pulmonary disease

## 2018-11-08 ENCOUNTER — Ambulatory Visit (INDEPENDENT_AMBULATORY_CARE_PROVIDER_SITE_OTHER): Payer: BLUE CROSS/BLUE SHIELD | Admitting: Ophthalmology

## 2018-11-08 ENCOUNTER — Encounter (INDEPENDENT_AMBULATORY_CARE_PROVIDER_SITE_OTHER): Payer: Self-pay | Admitting: Ophthalmology

## 2018-11-08 ENCOUNTER — Other Ambulatory Visit: Payer: Self-pay

## 2018-11-08 DIAGNOSIS — H4312 Vitreous hemorrhage, left eye: Secondary | ICD-10-CM

## 2018-11-08 DIAGNOSIS — I10 Essential (primary) hypertension: Secondary | ICD-10-CM

## 2018-11-08 DIAGNOSIS — H35033 Hypertensive retinopathy, bilateral: Secondary | ICD-10-CM

## 2018-11-08 DIAGNOSIS — H4311 Vitreous hemorrhage, right eye: Secondary | ICD-10-CM

## 2018-11-08 DIAGNOSIS — H44001 Unspecified purulent endophthalmitis, right eye: Secondary | ICD-10-CM

## 2018-11-08 DIAGNOSIS — H25813 Combined forms of age-related cataract, bilateral: Secondary | ICD-10-CM

## 2018-11-08 DIAGNOSIS — H3581 Retinal edema: Secondary | ICD-10-CM

## 2018-11-08 DIAGNOSIS — E113513 Type 2 diabetes mellitus with proliferative diabetic retinopathy with macular edema, bilateral: Secondary | ICD-10-CM

## 2018-11-09 ENCOUNTER — Encounter (INDEPENDENT_AMBULATORY_CARE_PROVIDER_SITE_OTHER): Payer: Self-pay | Admitting: Ophthalmology

## 2018-11-16 IMAGING — MR MR HEAD WO/W CM
13 series · 48 of 48 positions shown · IV contrast (multihance)
Comparison: MRI 03/20/2016, 09/09/2015

CLINICAL DATA: Benign neoplasm supratentorial brain

Creatinine was obtained on site at [HOSPITAL] at [HOSPITAL].
Results: Creatinine 0.4 mg/dL.
EXAM:
MRI HEAD WITHOUT AND WITH CONTRAST
TECHNIQUE: Multiplanar, multiecho pulse sequences of the brain and surrounding
structures were obtained without and with intravenous contrast.
CONTRAST:  14mL MULTIHANCE GADOBENATE DIMEGLUMINE 529 MG/ML IV SOLN

[Series 2: t1_se_sag · sagittal · 5.0mm · 0.45mm/px · 1 of 21 slices shown]
[im 1/21]
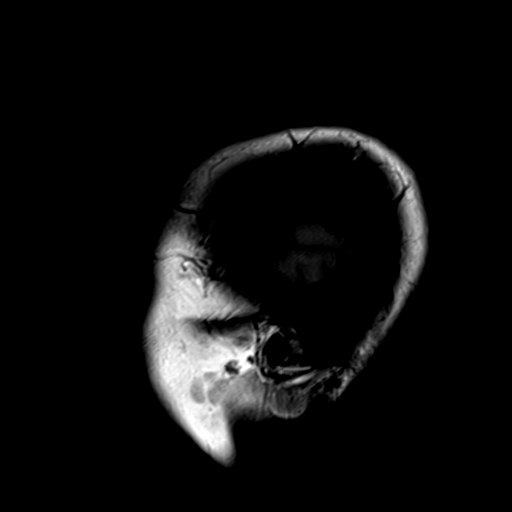

[Series 3: ep2d_diff_(id)_trace · axial · 3.0mm · 1.80mm/px · z∈[-45,+96]mm · 5 of 96 slices shown]
[im 1/96]
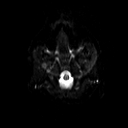
[im 24/96]
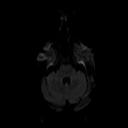
[im 48/96]
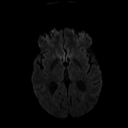
[im 72/96]
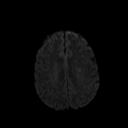
[im 96/96]
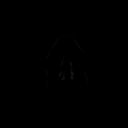

[Series 4: ep2d_diff_(id)_trace_adc · axial · 3.0mm · 1.80mm/px · z∈[-45,+96]mm · 3 of 48 slices shown]
[im 1/48]
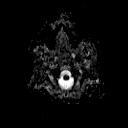
[im 24/48]
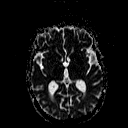
[im 48/48]
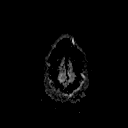

[Series 5: ep2d_diff_cor · coronal · 5.0mm · 1.77mm/px · 3 of 47 slices shown]
[im 1/47]
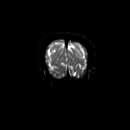
[im 24/47]
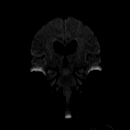
[im 47/47]
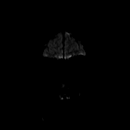

[Series 6: ep2d_diff_cor_adc · coronal · 5.0mm · 1.77mm/px · 2 of 24 slices shown]
[im 1/24]
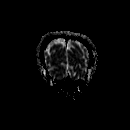
[im 24/24]
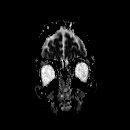

[Series 8: swi_images · axial · 2.0mm · 0.90mm/px · z∈[-52,+106]mm · 5 of 80 slices shown]
[im 1/80]
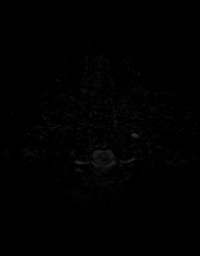
[im 20/80]
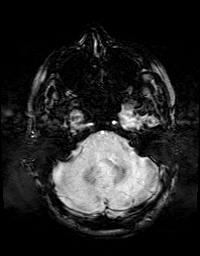
[im 40/80]
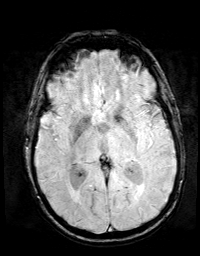
[im 60/80]
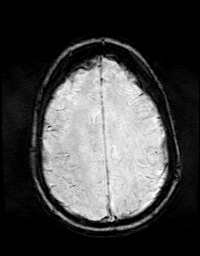
[im 80/80]
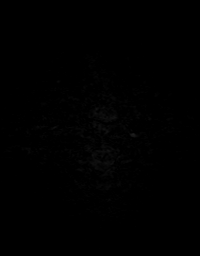

[Series 9: FLAIR · axial · 3.0mm · 0.43mm/px · z∈[-52,+103]mm · 2 of 27 slices shown]
[im 1/27]
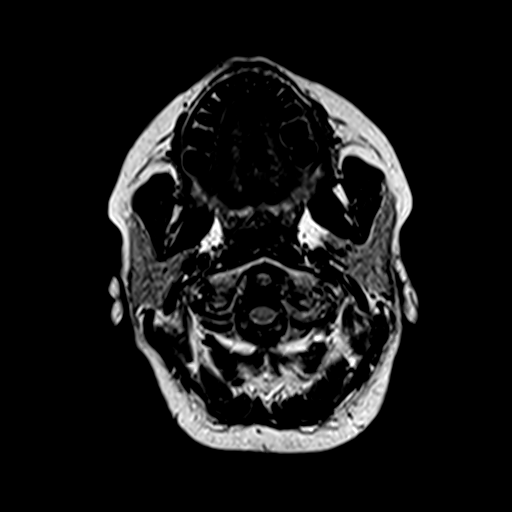
[im 27/27]
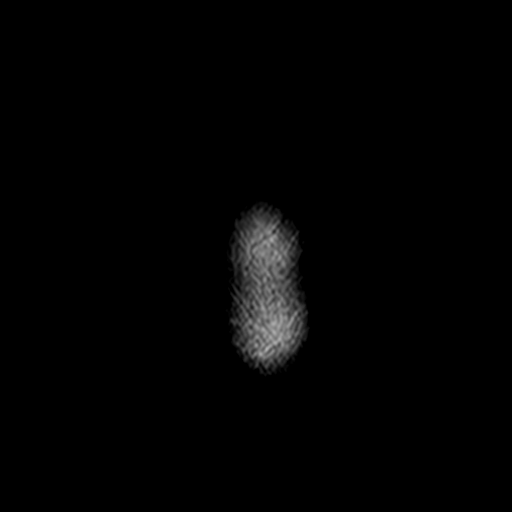

[Series 10: t2_tse_tra_512 · axial · 5.0mm · 0.60mm/px · z∈[-42,+96]mm · 2 of 24 slices shown]
[im 1/24]
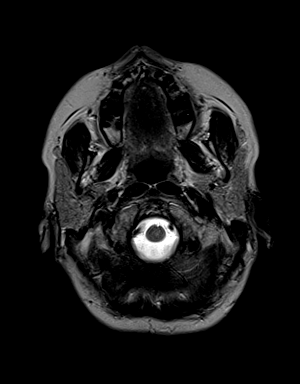
[im 24/24]
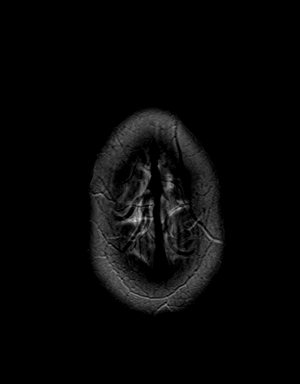

[Series 11: t1_mpr_tra · axial · 1.0mm · 0.72mm/px · z∈[-52,+106]mm · 10 of 160 slices shown]
[im 1/160]
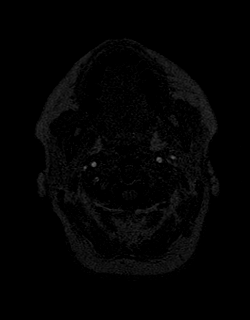
[im 18/160]
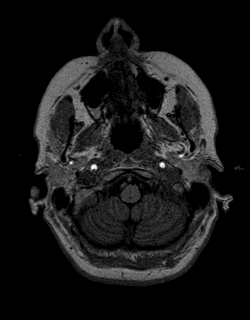
[im 36/160]
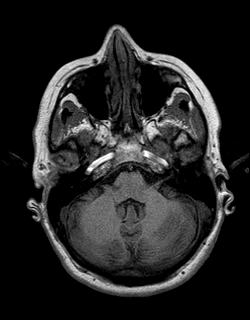
[im 54/160]
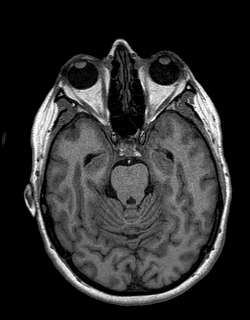
[im 71/160]
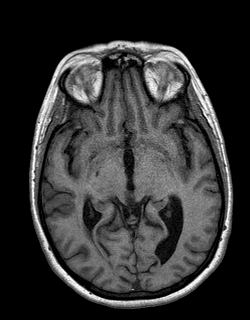
[im 89/160]
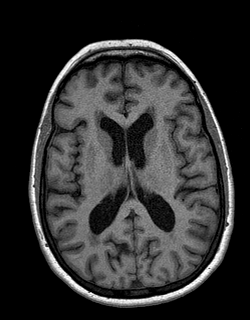
[im 107/160]
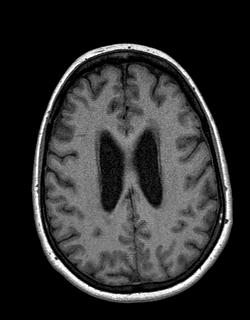
[im 124/160]
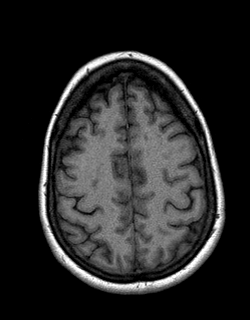
[im 142/160]
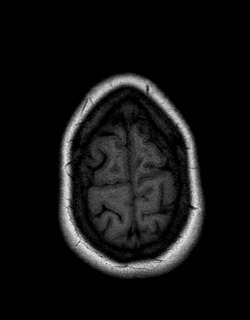
[im 160/160]
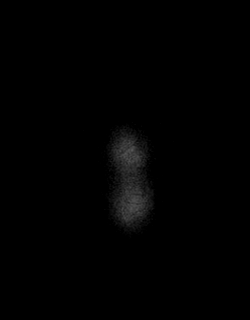

[Series 12: T2 · coronal · 5.0mm · 0.45mm/px · 2 of 26 slices shown]
[im 1/26]
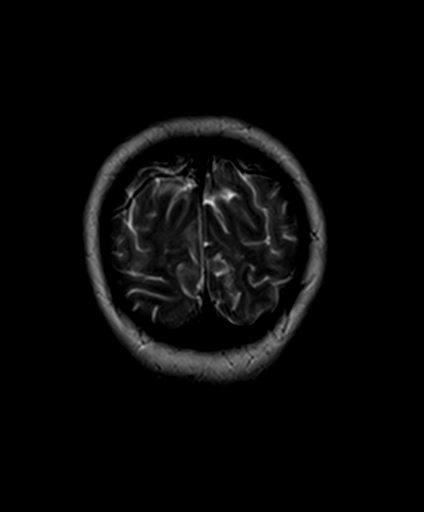
[im 26/26]
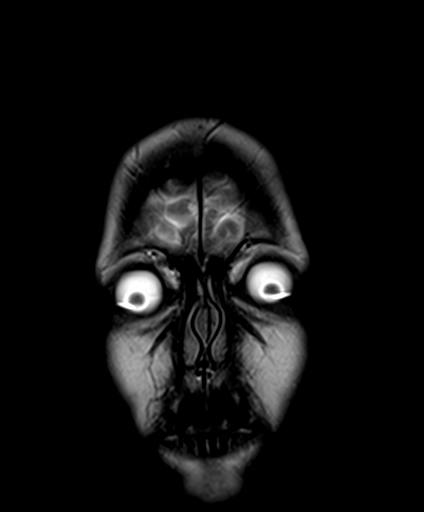

[Series 13: T1 post-contrast · coronal · 5.0mm · 0.72mm/px · 2 of 26 slices shown]
[im 1/26]
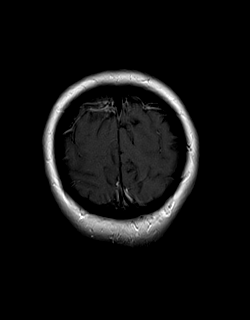
[im 26/26]
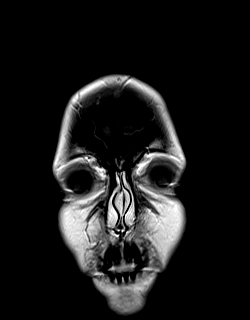

[Series 14: post t1_mpr_tra · axial · 1.0mm · 0.72mm/px · z∈[-52,+106]mm · 10 of 160 slices shown]
[im 1/160]
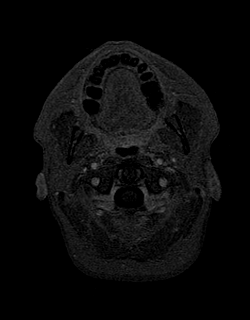
[im 18/160]
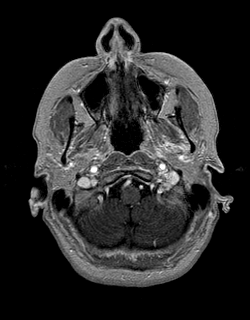
[im 36/160]
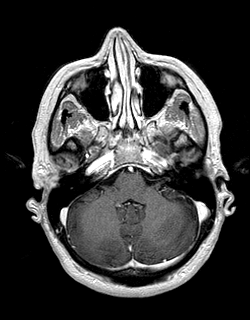
[im 54/160]
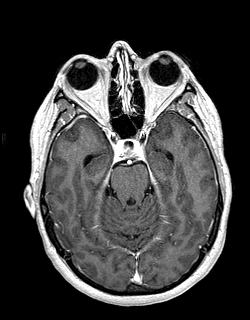
[im 71/160]
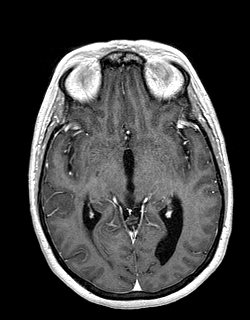
[im 89/160]
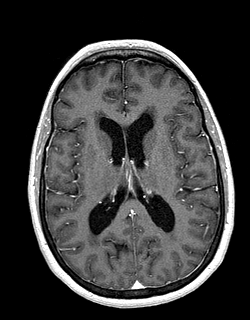
[im 107/160]
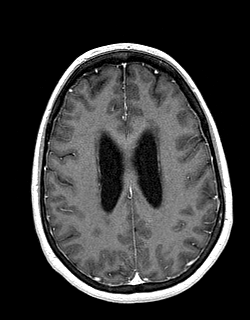
[im 124/160]
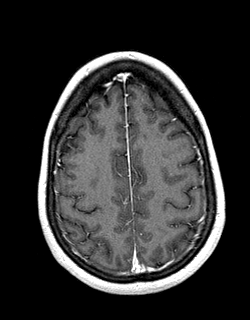
[im 142/160]
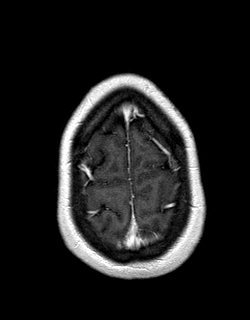
[im 160/160]
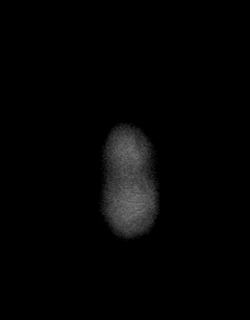

[Series 15: t1_se_sag post · sagittal · 5.0mm · 0.45mm/px · 1 of 21 slices shown]
[im 1/21]
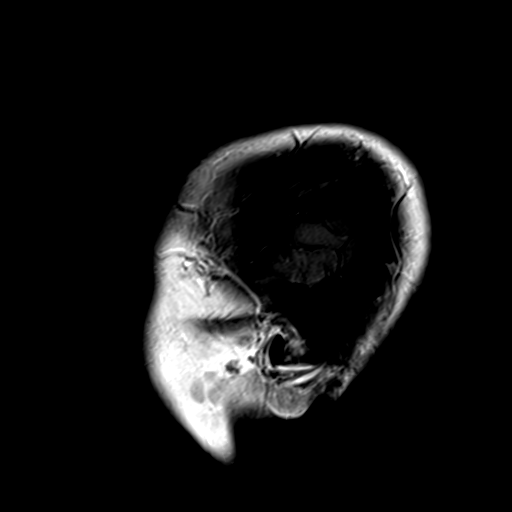

[48 of 48 positions shown; findings below may reference images not displayed]

FINDINGS: Brain: 11 mm cystic lesion in the region of the cerebral aqueduct in
the midbrain is stable. This does not show abnormal enhancement and
has well-defined margins with CSF signal intensity. It is difficult
determine if this is arising from the adjacent midbrain tissue or
this is a cyst within the aqueduct. There appears to be thinning of
the tectum. No restricted diffusion.

Mild obstructive hydrocephalus unchanged from prior studies. Mild
periventricular hyperintensity bilaterally compatible with
transependymal resorption of CSF also unchanged. Several small white
matter hyperintensities bilaterally. No acute infarct or hemorrhage.
Normal enhancement postcontrast infusion

Vascular: Normal arterial flow voids

Skull and upper cervical spine: Negative

Sinuses/Orbits: Negative

Other: None
IMPRESSION: Stable 11 mm cystic lesion in the region of the cerebral aqueduct.
This is now stable for over 3 years. While this could represent a
low-grade cystic neoplasm, I think that it is likely this represents
dilated cerebral aqueduct related to aqueductal stenosis and
subsequent hydrocephalus.

## 2018-11-18 ENCOUNTER — Other Ambulatory Visit: Payer: Self-pay | Admitting: Family Medicine

## 2018-11-26 ENCOUNTER — Other Ambulatory Visit: Payer: Self-pay | Admitting: Family Medicine

## 2018-11-27 NOTE — Progress Notes (Addendum)
[facility] Note  11/29/2018     CHIEF COMPLAINT Patient presents for Post-op Follow-up   HISTORY OF PRESENT ILLNESS: Gloria Gomez is a 48 y.o. female who presents to the clinic today for:   HPI    Post-op Follow-up    In right eye.  Discomfort includes Negative for pain, itching, foreign body sensation, tearing, discharge, floaters and none.  Vision is improved.  I, the attending physician,  performed the HPI with the patient and updated documentation appropriately.          Comments    Patient states vision improved OD, especially at near. Distance vision foggy or milky OD. No eye pain. BS was 136 this am. Last a1c was 7.7, checked 2 months ago.        Last edited by Bernarda Caffey, MD on 11/29/2018  1:23 PM. (History)    pt states her up close vision is pretty good, but her distance vision is "milky/cloudy", she can still see a small gas bubble  Referring physician: Tammi Sou, MD [address] [place],  [place] [address]  HISTORICAL INFORMATION:   Selected notes from the MEDICAL RECORD NUMBER Referred for DM exam LEE:  Ocular Hx-NPDR PMH-DM (takes invokana and metformin), high cholesterol, benign brain tumor, white coat HTN    CURRENT MEDICATIONS: Current Outpatient Medications (Ophthalmic Drugs)  Medication Sig  . atropine 1 % ophthalmic solution Place 1 drop into the right eye 2 (two) times daily.  . bacitracin-polymyxin b (POLYSPORIN) ophthalmic ointment Place 1 application into the right eye. At bedtime and as needed  . brimonidine (ALPHAGAN) 0.15 % ophthalmic solution Place 1 drop into the right eye 2 (two) times daily. (Patient taking differently: Place 1 drop into both eyes daily. )  . prednisoLONE acetate (PRED FORTE) 1 % ophthalmic suspension Place 1 drop into the left eye 4 (four) times daily. Place 1 drop into the left eye 4 times daily for 7 days  . Difluprednate (DUREZOL) 0.05 % EMUL Place 1 drop into the right eye  4 (four) times daily. (Patient not taking: Reported on 10/28/2018)  . gatifloxacin (ZYMAXID) 0.5 % SOLN Place 1 drop into the right eye 4 (four) times daily.  . prednisoLONE acetate (PRED FORTE) 1 % ophthalmic suspension Place 1 drop into the right eye every hour while awake. (Patient not taking: Reported on 11/29/2018)   No current facility-administered medications for this visit.  (Ophthalmic Drugs)   Current Outpatient Medications (Other)  Medication Sig  . APPLE CIDER VINEGAR PO Take 2 tablets by mouth daily.  Marland Kitchen glipiZIDE (GLUCOTROL XL) 10 MG 24 hr tablet TAKE 1 TABLET BY MOUTH WITH BREAKFAST (Patient taking differently: Take 10 mg by mouth daily with breakfast. )  . HYDROcodone-acetaminophen (NORCO/VICODIN) 5-325 MG tablet Take 1-2 tablets by mouth every 6 (six) hours as needed for moderate pain. (Patient taking differently: Take 1 tablet by mouth every 12 (twelve) hours as needed for moderate pain. )  . INVOKANA 300 MG TABS tablet TAKE 1 TABLET BY MOUTH ONCE DAILY BEFORE BREAKFAST  . losartan (COZAAR) 100 MG tablet Take 1 tablet by mouth once daily (Patient taking differently: Take 100 mg by mouth daily. )  . metoprolol succinate (TOPROL-XL) 100 MG 24 hr tablet Take 1 tablet (100 mg total) by mouth daily. Take with or immediately following a meal.  . Multiple Vitamin (MULTIVITAMIN WITH MINERALS) TABS tablet Take 1 tablet by mouth daily.  Marland Kitchen OVER THE COUNTER  MEDICATION Take 2 tablets by mouth daily. Neu Remedy  . pantoprazole (PROTONIX) 40 MG tablet Take 1 tablet (40 mg total) by mouth daily. OFFICE VISIT NEEDED  . sitaGLIPtin-metformin (JANUMET) 50-1000 MG tablet Take 1 tablet by mouth 2 (two) times daily with a meal.  . metFORMIN (GLUCOPHAGE-XR) 500 MG 24 hr tablet Take 2 tablets (1,000 mg total) by mouth 2 (two) times daily. (Patient not taking: Reported on 11/29/2018)  . promethazine (PHENERGAN) 12.5 MG tablet 1-2 tabs po q6h prn nausea (Patient not taking: Reported on 10/28/2018)  .  rosuvastatin (CRESTOR) 10 MG tablet Take 1 tablet (10 mg total) by mouth daily. (Patient not taking: Reported on 10/28/2018)  . temazepam (RESTORIL) 15 MG capsule Take 1 capsule (15 mg total) by mouth at bedtime as needed for sleep. (Patient not taking: Reported on 10/28/2018)   No current facility-administered medications for this visit.  (Other)      REVIEW OF SYSTEMS: ROS    Positive for: Endocrine, Eyes   Negative for: Constitutional, Gastrointestinal, Neurological, Skin, Genitourinary, Musculoskeletal, HENT, Cardiovascular, Respiratory, Psychiatric, Allergic/Imm, Heme/Lymph   Last edited by Roselee Nova D, COT on 11/29/2018  1:06 PM. (History)       ALLERGIES Allergies  Allergen Reactions  . Gluten Meal Swelling  . Lisinopril Cough  . Pioglitazone Other (See Comments)    ELEVATED glucoses + worse chronic nausea    PAST MEDICAL HISTORY Past Medical History:  Diagnosis Date  . Abdominal bloating    likely from diab gastroparesis.  Dr. Havery Moros to do EGD as of 10/2017 GI eval.  . Benign brain tumor ([place])    Cystic lesion in cerebral aqueduct region with mild hydrocephalus-- stable MRI 02/2016.  Surveillance MRI 05/2017 --dilated cerebral aqueduct related to aqueductal stenosis and subsequent mild hydrocephalus (due to the 11 mm stable cystic lesion in cerebral aqueduct---?congenitial?.  . Cataract    OU  . Dysmenorrhea    vicodin occ during first 2 days of cycle.  . Gluten intolerance    pt reports she underwent full GI w/u to r/o celiac dz  . Hepatic steatosis    ultrasound 08/2017  . Hyperlipidemia, mixed   . Hypertensive retinopathy    OU  . Hypertensive retinopathy of both eyes   . Insomnia   . Proliferative diabetic retinopathy of both eyes ([place])    steroid injections 10/2017--improved  . Sensorineural hearing loss of left ear    Sudden left hearing loss summer 2016--no improvement with steroids 01/2015 so brain MRI done by Dr. Redmond Baseman and it showed brain tumor that  was determined to be benign.  Pt's hearing not bad enough for hearing aid as of 06/2016.  . Type 2 diabetes with complication ([place])    diab retpthy, diabetic gastroparesis (gastric emptying study mildly abnl 03/2017).  Recommended lantus 08/2018 but pt declined.  . White coat hypertension    Past Surgical History:  Procedure Laterality Date  . CHOLECYSTECTOMY  2000  . GAS INSERTION Right 10/31/2018   Procedure: Insertion Of C3F8 Gas;  Surgeon: Bernarda Caffey, MD;  Location: [facility];  Service: Ophthalmology;  Laterality: Right;  . GASTRIC EMPTYING SCAN  04/20/2017   Mildly abnormal, particularly the 1st hour of emptying.  Marland Kitchen MEMBRANE PEEL Right 10/31/2018   Procedure: MEMBRANE PEEL;  Surgeon: Bernarda Caffey, MD;  Location: [facility];  Service: Ophthalmology;  Laterality: Right;  . PARS PLANA VITRECTOMY Right 04/12/2018   Procedure: Right PARS PLANA VITRECTOMY WITH 25 GAUGE with intravitreal antibiotics;  Surgeon: Bernarda Caffey,  MD;  Location: [facility];  Service: Ophthalmology;  Laterality: Right;  . PARS PLANA VITRECTOMY Right 10/31/2018   Procedure: PARS PLANA VITRECTOMY WITH 25 GAUGE;  Surgeon: Bernarda Caffey, MD;  Location: [facility];  Service: Ophthalmology;  Laterality: Right;  . PHOTOCOAGULATION WITH LASER Right 10/31/2018   Procedure: Photocoagulation With Laser;  Surgeon: Bernarda Caffey, MD;  Location: [facility];  Service: Ophthalmology;  Laterality: Right;    FAMILY HISTORY Family History  Problem Relation Age of Onset  . Brain cancer Mother   . Diabetes Father   . Diabetes Maternal Grandmother   . Cataracts Maternal Grandmother   . Cervical cancer Paternal Grandmother   . Colon cancer Maternal Grandfather 64  . Amblyopia Neg Hx   . Blindness Neg Hx   . Glaucoma Neg Hx   . Macular degeneration Neg Hx   . Retinal detachment Neg Hx   . Strabismus Neg Hx   . Retinitis pigmentosa Neg Hx     SOCIAL HISTORY Social History   Tobacco Use  . Smoking status: Never Smoker  . Smokeless tobacco: Never Used   Substance Use Topics  . Alcohol use: No  . Drug use: No         OPHTHALMIC EXAM:  Base Eye Exam    Visual Acuity (Snellen - Linear)      Right Left   Dist cc 20/200 20/30   Dist ph cc 20/40 -2 20/25 -1       Tonometry (Tonopen, 1:15 PM)      Right Left   Pressure 13 15       Pupils      Dark Light Shape React APD   Right 4 3 Round Slow None   Left 4 3 Round Slow None       Visual Fields (Counting fingers)      Left Right    Full    Restrictions  Partial outer inferior temporal deficiency       Neuro/Psych    Oriented x3: Yes   Mood/Affect: Normal       Dilation    Both eyes: 1.0% Mydriacyl, 2.5% Phenylephrine @ 1:15 PM        Slit Lamp and Fundus Exam    Slit Lamp Exam      Right Left   Lids/Lashes Dermatochalasis - upper lid, Meibomian gland dysfunction, Telangiectasia Dermatochalasis - upper lid, Meibomian gland dysfunction, Telangiectasia   Conjunctiva/Sclera White and quiet, sutures intact White and quiet   Cornea Clear, trace Punctate epithelial erosions Trace Punctate epithelial erosions   Anterior Chamber moderate depth, narrow temporal angle Deep and quiet   Iris Round and dilated, No NVI Round and dilated, No NVI   Lens 2-3+ Nuclear sclerosis, 2+ Cortical cataract, 1+PSC 2+ Nuclear sclerosis, 2+ Cortical cataract   Vitreous post vitrectomy; 40% gas fill Vitreous syneresis, diffuse hemorrhage clearing centrally and settling inferiorly       Fundus Exam      Right Left   Disc Pink and Sharp, improved fibrosis  Pink and sharp   C/D Ratio 0.1 0.1   Macula Flat, Blunted foveal reflex, cluster of MA and exudates ST macula Flat, blunted foveal reflex, scattered MA, exudate and cystic changes, trace ERM nasally    Vessels mild Vascular attenuation, tractional fibrosis along proximal ST arcades removed -- flat under gas Vascular attenuation   Periphery Attached, tractional fibrosis SN quadrant gone and flat under gas, scattered DBH, 360 PRP, good laser  fill in 360 Attached, inferior retina  partially obscured by VH; scattered DBH, good 360 PRP laser changes - room for fill in posteriorly          IMAGING AND PROCEDURES  Imaging and Procedures for @TODAY @  OCT, Retina - OU - Both Eyes       Right Eye Quality was good. Central Foveal Thickness: 358. Progression has improved. Findings include abnormal foveal contour, epiretinal membrane, no SRF, intraretinal hyper-reflective material, vitreous traction, preretinal fibrosis (Mild persistent DME; Interval resolution of PRF/vitreous traction along ST arcades).   Left Eye Quality was good. Central Foveal Thickness: 302. Progression has been stable. Findings include intraretinal fluid, no SRF, normal foveal contour, intraretinal hyper-reflective material, epiretinal membrane (Persistent mild cystic changes).   Notes *Images captured and stored on drive  Diagnosis / Impression:  OD: mild persistent DME --  Interval resolution of PRF/vitreous traction along ST arcades OS: NFP; +IRF; persistent mild cystic changes / IRF   Clinical management:  See below  Abbreviations: NFP - Normal foveal profile. CME - cystoid macular edema. PED - pigment epithelial detachment. IRF - intraretinal fluid. SRF - subretinal fluid. EZ - ellipsoid zone. ERM - epiretinal membrane. ORA - outer retinal atrophy. ORT - outer retinal tubulation. SRHM - subretinal hyper-reflective material                 ASSESSMENT/PLAN:    ICD-10-CM   1. Proliferative diabetic retinopathy of both eyes with macular edema associated with type 2 diabetes mellitus ([place])  [id number]   2. Retinal edema  H35.81 OCT, Retina - OU - Both Eyes  3. Vitreous hemorrhage of left eye ([facility])  H43.12   4. Right endophthalmia  H44.001   5. Vitreous hemorrhage, right eye ([place])  H43.11   6. Essential hypertension  I10   7. Hypertensive retinopathy of both eyes  H35.033   8. Combined forms of age-related cataract of both eyes  H25.813      1,2.  Proliferative diabetic retinopathy w/ DME, OU  - HbA1c 8.6% (06.30.20) from 8.4 (02.14.20), but pt reports better control of BG with recent medication adjustments -- added Janumet  - s/p IVA OD #1 9.20.19, #2 (10.25.19), #3 (11.15.19), #4 (12.17.19), #5 (01.14.20), #6 (2.11.20), #7 (05.29.20), #8 (08.19.20)  - s/p IVA OS #1 9.27.19, #2 (10.25.19), #3 (11.15.19), #4 (12.17.19), #5 (01.14.20), #6 (2.11.20), #7 (04.26.20), #8 (05.29.20), #9 (06.26.20), #10 (08.05.20)  - S/P PRP OS (09.20.19), (5.19.20), (08.19.20)  - S/P PRP OD (9.27.19 and 11.21.19), fill-in (04.14.20)  - FA (9.20.19) shows +NVE OU and leaking MA and capillary nonperfusion  - repeat FA 11.15.19 shows NV regressing OU  - recommend IVA OD #11 today due to persistent IRF  - pt wishes to monitor for now -- reasonable  - pre-op: OD w/ VA stable at 20/25, but there is some preretinal fibrosis / tractional membranes just superior to disc and mild central DME  - now s/p 25g PPV+MP+10% C3F8 gas OD (09.03.20) -- ERM/PRF removal OD             - doing well              - fibrosis/ERM improved; retina attached  - gas bubble ~40%             - IOP okay at 13             - start  PF taper, 3x/day for 7 days, then 2x/day until next visit   Brimonidine BID OD -- okay to stop  PSO ung QID OD -- use at night / PRN             - avoid laying flat on back              - post op drop and positioning instructions reviewed              - tylenol/ibuprofen for pain  - f/u 4 weeks -- POV  3. Vitreous hemorrhage OS  - new onset 4.24.20  - etiology: secondary to PDR as described below (no RT/RD on exam)  - s/p IVA OS on 4.26.20, 5.29.20, 6.26.20 and now 8.5.20  - today, further VH improvement with just mild residual VH settled inferiorly -- center clear  - pt reports recent change in BP/DM meds   - BCVA stable at 20/25   - s/p PRP OS (9.20.19), (05.19.20), (08.19.0)  4. History of Endophthalmitis OD  - s/p IVA  OU 04/09/2018  - s/p 25g PPV w/ intravitreal vanc, ceftaz and cefepime OD, 2.14.2020  - s/p intravitreal tap / vanc and ceflaz injections (02.16.20)             - gram stain (2.14.20) shows G+ cocci, WBCs mostly PMNs;   - repeat gram stain from t/i (2.16.20) -- no organisms, just WBCs             - cultures from vitreous grew rare Staph warneri; cultures from t/i -- no growth             - doing well, BCVA 20/25             - inflammation/posterior debris resolved             - IOP 13 off Brimonidine  - completed po pred taper -- caused significant elevations in BG  - monitor  5. History of Vitreous Hemorrhage OD -- cleared from PPV x2 for endophthalmitis and ERM/preretinal fibrosis  - secondary to PDR as described below  - S/P IVA OD #1 (09.20.19), #2 (10.25.19), #3 (11.15.19), #4 (12.16.19), #5 (03/12/2018)  - S/P PRP OD #1 (09.27.19), fill in OD (11.21.19) -- each somewhat limited inferiorly by residual VH  6,7. Hypertensive retinopathy OU  - discussed importance of tight BP control.  - monitor for now  8. Combined form age related cataract OU-   - The symptoms of cataract, surgical options, and treatments and risks were discussed with patient.  - discussed diagnosis and progression  - not yet visually significant  - monitor for now   Ophthalmic Meds Ordered this visit:  No orders of the defined types were placed in this encounter.      Return in about 4 weeks (around 12/27/2018) for f/u PDR OU, DFE, OCT.  There are no Patient Instructions on file for this visit.   Explained the diagnoses, plan, and follow up with the patient and they expressed understanding.  Patient expressed understanding of the importance of proper follow up care.   This document serves as a record of services personally performed by Gardiner Sleeper, MD, PhD. It was created on their behalf by Roselee Nova, COMT. The creation of this record is the provider's dictation and/or activities during the  visit.  Electronically signed by: Roselee Nova, COMT 11/30/18 8:47 PM   Gardiner Sleeper, M.D., Ph.D. Diseases & Surgery of the Retina and Vitreous [facility]   I have reviewed the above documentation for accuracy and completeness, and I agree with the above.  Gardiner Sleeper, M.D., Ph.D. 11/30/18 8:47 PM    Abbreviations: M myopia (nearsighted); A astigmatism; H hyperopia (farsighted); P presbyopia; Mrx spectacle prescription;  CTL contact lenses; OD right eye; OS left eye; OU both eyes  XT exotropia; ET esotropia; PEK punctate epithelial keratitis; PEE punctate epithelial erosions; DES dry eye syndrome; MGD meibomian gland dysfunction; ATs artificial tears; PFAT's preservative free artificial tears; [place] nuclear sclerotic cataract; PSC posterior subcapsular cataract; ERM epi-retinal membrane; PVD posterior vitreous detachment; RD retinal detachment; DM diabetes mellitus; DR diabetic retinopathy; NPDR non-proliferative diabetic retinopathy; PDR proliferative diabetic retinopathy; CSME clinically significant macular edema; DME diabetic macular edema; dbh dot blot hemorrhages; CWS cotton wool spot; POAG primary open angle glaucoma; C/D cup-to-disc ratio; HVF humphrey visual field; GVF goldmann visual field; OCT optical coherence tomography; IOP intraocular pressure; BRVO Branch retinal vein occlusion; CRVO central retinal vein occlusion; CRAO central retinal artery occlusion; BRAO branch retinal artery occlusion; RT retinal tear; SB scleral buckle; PPV pars plana vitrectomy; VH Vitreous hemorrhage; PRP panretinal laser photocoagulation; IVK intravitreal kenalog; VMT vitreomacular traction; MH Macular hole;  NVD neovascularization of the disc; NVE neovascularization elsewhere; AREDS age related eye disease study; ARMD age related macular degeneration; POAG primary open angle glaucoma; EBMD epithelial/anterior basement membrane dystrophy; ACIOL anterior chamber intraocular lens; IOL  intraocular lens; PCIOL posterior chamber intraocular lens; Phaco/IOL phacoemulsification with intraocular lens placement; [place] photorefractive keratectomy; LASIK laser assisted in situ keratomileusis; HTN hypertension; DM diabetes mellitus; COPD chronic obstructive pulmonary disease

## 2018-11-29 ENCOUNTER — Ambulatory Visit (INDEPENDENT_AMBULATORY_CARE_PROVIDER_SITE_OTHER): Payer: BLUE CROSS/BLUE SHIELD | Admitting: Ophthalmology

## 2018-11-29 ENCOUNTER — Encounter (INDEPENDENT_AMBULATORY_CARE_PROVIDER_SITE_OTHER): Payer: Self-pay | Admitting: Ophthalmology

## 2018-11-29 ENCOUNTER — Other Ambulatory Visit: Payer: Self-pay

## 2018-11-29 DIAGNOSIS — H35033 Hypertensive retinopathy, bilateral: Secondary | ICD-10-CM

## 2018-11-29 DIAGNOSIS — H3581 Retinal edema: Secondary | ICD-10-CM | POA: Diagnosis not present

## 2018-11-29 DIAGNOSIS — H25813 Combined forms of age-related cataract, bilateral: Secondary | ICD-10-CM

## 2018-11-29 DIAGNOSIS — I10 Essential (primary) hypertension: Secondary | ICD-10-CM

## 2018-11-29 DIAGNOSIS — H4311 Vitreous hemorrhage, right eye: Secondary | ICD-10-CM

## 2018-11-29 DIAGNOSIS — E113513 Type 2 diabetes mellitus with proliferative diabetic retinopathy with macular edema, bilateral: Secondary | ICD-10-CM

## 2018-11-29 DIAGNOSIS — H44001 Unspecified purulent endophthalmitis, right eye: Secondary | ICD-10-CM

## 2018-11-29 DIAGNOSIS — H4312 Vitreous hemorrhage, left eye: Secondary | ICD-10-CM

## 2018-12-04 ENCOUNTER — Other Ambulatory Visit: Payer: Self-pay | Admitting: Family Medicine

## 2018-12-16 ENCOUNTER — Other Ambulatory Visit: Payer: Self-pay | Admitting: Family Medicine

## 2018-12-24 ENCOUNTER — Other Ambulatory Visit: Payer: Self-pay | Admitting: Family Medicine

## 2018-12-25 NOTE — Progress Notes (Signed)
[facility] Note  12/27/2018     CHIEF COMPLAINT Patient presents for Retina Follow Up   HISTORY OF PRESENT ILLNESS: Gloria Gomez is a 48 y.o. female who presents to the clinic today for:   HPI    Retina Follow Up    Patient presents with  Retinal Break/Detachment.  In right eye.  Severity is moderate.  Duration of 4 weeks.  Since onset it is stable.  I, the attending physician,  performed the HPI with the patient and updated documentation appropriately.          Comments    Patient states bubble almost gone OD, can see well at near with OD but not good at distance. Using PF bid OD. BS was 139 this am. Last a1c was 7.9.        Last edited by Bernarda Caffey, MD on 12/27/2018  4:28 PM. (History)    pt states her gas bubble is almost totally gone, she states she can see well up close, but her distance vision is still fuzzy, pt states she has had a couple spikes in blood pressure recently, she states a couple weeks ago when she went to the beach her ankles swelled to the point she thought they were going to start "leaking", she states it took 3-4 days from them to come down  Referring physician:   HISTORICAL INFORMATION:   Selected notes from the MEDICAL RECORD NUMBER Referred for DM exam LEE:  Ocular Hx-NPDR PMH-DM (takes invokana and metformin), high cholesterol, benign brain tumor, white coat HTN    CURRENT MEDICATIONS: Current Outpatient Medications (Ophthalmic Drugs)  Medication Sig  . prednisoLONE acetate (PRED FORTE) 1 % ophthalmic suspension Place 1 drop into the left eye 4 (four) times daily. Place 1 drop into the left eye 4 times daily for 7 days (Patient taking differently: Place 1 drop into the right eye 2 (two) times daily. Place 1 drop into the left eye 4 times daily for 7 days)  . atropine 1 % ophthalmic solution Place 1 drop into the right eye 2 (two) times daily.  . bacitracin-polymyxin b (POLYSPORIN) ophthalmic ointment Place 1  application into the right eye. At bedtime and as needed  . brimonidine (ALPHAGAN) 0.15 % ophthalmic solution Place 1 drop into the right eye 2 (two) times daily. (Patient not taking: Reported on 12/27/2018)  . Difluprednate (DUREZOL) 0.05 % EMUL Place 1 drop into the right eye 4 (four) times daily. (Patient not taking: Reported on 10/28/2018)  . gatifloxacin (ZYMAXID) 0.5 % SOLN Place 1 drop into the right eye 4 (four) times daily.  . prednisoLONE acetate (PRED FORTE) 1 % ophthalmic suspension Place 1 drop into the right eye 2 (two) times daily.   No current facility-administered medications for this visit.  (Ophthalmic Drugs)   Current Outpatient Medications (Other)  Medication Sig  . APPLE CIDER VINEGAR PO Take 2 tablets by mouth daily.  Marland Kitchen glipiZIDE (GLUCOTROL XL) 10 MG 24 hr tablet TAKE 1 TABLET BY MOUTH WITH BREAKFAST  . HYDROcodone-acetaminophen (NORCO/VICODIN) 5-325 MG tablet Take 1-2 tablets by mouth every 6 (six) hours as needed for moderate pain. (Patient taking differently: Take 1 tablet by mouth every 12 (twelve) hours as needed for moderate pain. )  . INVOKANA 300 MG TABS tablet TAKE 1 TABLET BY MOUTH ONCE DAILY BEFORE BREAKFAST  . losartan (COZAAR) 100 MG tablet Take 1 tablet by mouth once daily (Patient taking differently: Take 100 mg by mouth daily. )  .  metFORMIN (GLUCOPHAGE-XR) 500 MG 24 hr tablet Take 2 tablets (1,000 mg total) by mouth 2 (two) times daily.  . metoprolol succinate (TOPROL-XL) 100 MG 24 hr tablet TAKE 1 TABLET BY MOUTH ONCE DAILY TAKE  WITH  OR  IMMEDIATELY  FOLLOWING  A  MEAL  . Multiple Vitamin (MULTIVITAMIN WITH MINERALS) TABS tablet Take 1 tablet by mouth daily.  Marland Kitchen OVER THE COUNTER MEDICATION Take 2 tablets by mouth daily. Neu Remedy  . pantoprazole (PROTONIX) 40 MG tablet TAKE 1 TABLET BY MOUTH ONCE DAILY . APPOINTMENT REQUIRED FOR FUTURE REFILLS  . sitaGLIPtin-metformin (JANUMET) 50-1000 MG tablet Take 1 tablet by mouth 2 (two) times daily with a meal.  .  promethazine (PHENERGAN) 12.5 MG tablet 1-2 tabs po q6h prn nausea (Patient not taking: Reported on 10/28/2018)  . rosuvastatin (CRESTOR) 10 MG tablet Take 1 tablet (10 mg total) by mouth daily. (Patient not taking: Reported on 10/28/2018)  . temazepam (RESTORIL) 15 MG capsule Take 1 capsule (15 mg total) by mouth at bedtime as needed for sleep. (Patient not taking: Reported on 10/28/2018)   No current facility-administered medications for this visit.  (Other)      REVIEW OF SYSTEMS: ROS    Positive for: Endocrine, Eyes   Negative for: Constitutional, Gastrointestinal, Neurological, Skin, Genitourinary, Musculoskeletal, HENT, Cardiovascular, Respiratory, Psychiatric, Allergic/Imm, Heme/Lymph   Last edited by Roselee Nova D, COT on 12/27/2018  1:57 PM. (History)       ALLERGIES Allergies  Allergen Reactions  . Gluten Meal Swelling  . Lisinopril Cough  . Pioglitazone Other (See Comments)    ELEVATED glucoses + worse chronic nausea    PAST MEDICAL HISTORY Past Medical History:  Diagnosis Date  . Abdominal bloating    likely from diab gastroparesis.  Dr. Havery Moros to do EGD as of 10/2017 GI eval.  . Benign brain tumor ([place])    Cystic lesion in cerebral aqueduct region with mild hydrocephalus-- stable MRI 02/2016.  Surveillance MRI 05/2017 --dilated cerebral aqueduct related to aqueductal stenosis and subsequent mild hydrocephalus (due to the 11 mm stable cystic lesion in cerebral aqueduct---?congenitial?.  . Cataract    OU  . Dysmenorrhea    vicodin occ during first 2 days of cycle.  . Gluten intolerance    pt reports she underwent full GI w/u to r/o celiac dz  . Hepatic steatosis    ultrasound 08/2017  . Hyperlipidemia, mixed   . Hypertensive retinopathy    OU  . Hypertensive retinopathy of both eyes   . Insomnia   . Proliferative diabetic retinopathy of both eyes ([place])    steroid injections 10/2017--improved  . Sensorineural hearing loss of left ear    Sudden left hearing  loss summer 2016--no improvement with steroids 01/2015 so brain MRI done by Dr. Redmond Baseman and it showed brain tumor that was determined to be benign.  Pt's hearing not bad enough for hearing aid as of 06/2016.  . Type 2 diabetes with complication ([place])    diab retpthy, diabetic gastroparesis (gastric emptying study mildly abnl 03/2017).  Recommended lantus 08/2018 but pt declined.  . White coat hypertension    Past Surgical History:  Procedure Laterality Date  . CHOLECYSTECTOMY  2000  . GAS INSERTION Right 10/31/2018   Procedure: Insertion Of C3F8 Gas;  Surgeon: Bernarda Caffey, MD;  Location: [place];  Service: Ophthalmology;  Laterality: Right;  . GASTRIC EMPTYING SCAN  04/20/2017   Mildly abnormal, particularly the 1st hour of emptying.  Marland Kitchen MEMBRANE PEEL Right 10/31/2018  Procedure: MEMBRANE PEEL;  Surgeon: Bernarda Caffey, MD;  Location: [place];  Service: Ophthalmology;  Laterality: Right;  . PARS PLANA VITRECTOMY Right 04/12/2018   Procedure: Right PARS PLANA VITRECTOMY WITH 25 GAUGE with intravitreal antibiotics;  Surgeon: Bernarda Caffey, MD;  Location: [place];  Service: Ophthalmology;  Laterality: Right;  . PARS PLANA VITRECTOMY Right 10/31/2018   Procedure: PARS PLANA VITRECTOMY WITH 25 GAUGE;  Surgeon: Bernarda Caffey, MD;  Location: [place];  Service: Ophthalmology;  Laterality: Right;  . PHOTOCOAGULATION WITH LASER Right 10/31/2018   Procedure: Photocoagulation With Laser;  Surgeon: Bernarda Caffey, MD;  Location: [place];  Service: Ophthalmology;  Laterality: Right;    FAMILY HISTORY Family History  Problem Relation Age of Onset  . Brain cancer Mother   . Diabetes Father   . Diabetes Maternal Grandmother   . Cataracts Maternal Grandmother   . Cervical cancer Paternal Grandmother   . Colon cancer Maternal Grandfather 88  . Amblyopia Neg Hx   . Blindness Neg Hx   . Glaucoma Neg Hx   . Macular degeneration Neg Hx   . Retinal detachment Neg Hx   . Strabismus Neg Hx   . Retinitis pigmentosa Neg Hx      SOCIAL HISTORY Social History   Tobacco Use  . Smoking status: Never Smoker  . Smokeless tobacco: Never Used  Substance Use Topics  . Alcohol use: No  . Drug use: No         OPHTHALMIC EXAM:  Base Eye Exam    Visual Acuity (Snellen - Linear)      Right Left   Dist cc 20/150 +2 20/25 -2   Dist ph cc 20/30 -2 20/25 -1       Tonometry (Tonopen, 1:55 PM)      Right Left   Pressure 19 20       Pupils      Dark Light Shape React APD   Right 5 4.5 Round Minimal None   Left 4 3 Round Brisk None       Visual Fields (Counting fingers)      Left Right    Full Full  No VF defect noted today OD       Extraocular Movement      Right Left    Full, Ortho Full, Ortho       Neuro/Psych    Oriented x3: Yes   Mood/Affect: Normal       Dilation    Both eyes: 1.0% Mydriacyl, 2.5% Phenylephrine @ 1:55 PM        Slit Lamp and Fundus Exam    Slit Lamp Exam      Right Left   Lids/Lashes Dermatochalasis - upper lid, Meibomian gland dysfunction, Telangiectasia Dermatochalasis - upper lid, Meibomian gland dysfunction, Telangiectasia   Conjunctiva/Sclera White and quiet, sutures intact White and quiet   Cornea Clear, trace Punctate epithelial erosions 1+Punctate epithelial erosions   Anterior Chamber moderate depth, narrow temporal angle Deep and quiet   Iris Round and dilated, No NVI Round and dilated, No NVI   Lens 2-3+ Nuclear sclerosis, 2+ Cortical cataract, 2+PSC 2+ Nuclear sclerosis, 2+ Cortical cataract   Vitreous post vitrectomy; no gas bubble visible Vitreous syneresis, old hemorrhage clearing centrally and settling inferiorly       Fundus Exam      Right Left   Disc Pink and Sharp, improved fibrosis  Pink and sharp   C/D Ratio 0.2 0.1   Macula Flat, central edema / cystic changes,  cluster of MA and exudates greatest centrally Flat, good foveal reflex, scattered MA and exudate, mild cystic changes, trace ERM nasally    Vessels mild Vascular attenuation,  Tortuous, stable improvement in fibrosis along superior vessels Vascular attenuation   Periphery Attached, tractional fibrosis SN quadrant gone, scattered DBH, 360 PRP, good laser fill in 360 Attached, inferior retina partially obscured by VH; scattered DBH, scattered IRH, good 360 PRP laser changes - room for fill in posteriorly        Refraction    Wearing Rx      Sphere Cylinder   Right -3.75 Sphere   Left -3.75 Sphere       Manifest Refraction      Sphere Cylinder Axis Dist VA   Right -6.00 +0.50 035 20/30-2   Left -4.25 sph  20/25-2          IMAGING AND PROCEDURES  Imaging and Procedures for @TODAY @  OCT, Retina - OU - Both Eyes       Right Eye Quality was good. Central Foveal Thickness: 349. Progression has worsened. Findings include abnormal foveal contour, epiretinal membrane, no SRF, intraretinal hyper-reflective material, vitreous traction, preretinal fibrosis (Stable resolution of PRF/persistent IRF/vitreous traction along ST arcades).   Left Eye Quality was good. Central Foveal Thickness: 302. Progression has been stable. Findings include intraretinal fluid, no SRF, normal foveal contour, intraretinal hyper-reflective material, epiretinal membrane (Persistent mild cystic changes).   Notes *Images captured and stored on drive  Diagnosis / Impression:  OD: mild persistent DME -- interval increase;  Stable resolution of PRF/vitreous traction along ST arcades OS: NFP; +IRF; persistent mild cystic changes / IRF   Clinical management:  See below  Abbreviations: NFP - Normal foveal profile. CME - cystoid macular edema. PED - pigment epithelial detachment. IRF - intraretinal fluid. SRF - subretinal fluid. EZ - ellipsoid zone. ERM - epiretinal membrane. ORA - outer retinal atrophy. ORT - outer retinal tubulation. SRHM - subretinal hyper-reflective material         Intravitreal Injection, Pharmacologic Agent - OD - Right Eye       Time Out 12/27/2018. 2:38  PM. Confirmed correct patient, procedure, site, and patient consented.   Anesthesia Topical anesthesia was used. Anesthetic medications included Lidocaine 2%, Proparacaine 0.5%.   Procedure Preparation included 5% betadine to ocular surface, eyelid speculum. A supplied needle was used.   Injection:  1.25 mg Bevacizumab (AVASTIN) SOLN   NDC: [id number], Lot: [id number] , Expiration date: 02/12/2019   Route: Intravitreal, Site: Right Eye, Waste: 0 mL  Post-op Post injection exam found visual acuity of at least counting fingers. The patient tolerated the procedure well. There were no complications. The patient received written and verbal post procedure care education.                ASSESSMENT/PLAN:    ICD-10-CM   1. Proliferative diabetic retinopathy of both eyes with macular edema associated with type 2 diabetes mellitus (HCC)  02/25/2019 Intravitreal Injection, Pharmacologic Agent - OD - Right Eye    Bevacizumab (AVASTIN) SOLN 1.25 mg  2. Retinal edema  H35.81 OCT, Retina - OU - Both Eyes  3. Vitreous hemorrhage of left eye (HCC)  H43.12   4. Right endophthalmia  H44.001   5. Vitreous hemorrhage, right eye ([place])  H43.11   6. Essential hypertension  I10   7. Hypertensive retinopathy of both eyes  H35.033   8. Combined forms of age-related cataract of both eyes  H25.813     1,2.  Proliferative diabetic retinopathy w/ DME, OU  - HbA1c 8.6% (06.30.20) from 8.4 (02.14.20), but pt reports better control of BG with recent medication adjustments -- added Janumet  - s/p IVA OD #1 9.20.19, #2 (10.25.19), #3 (11.15.19), #4 (12.17.19), #5 (01.14.20), #6 (2.11.20), #7 (05.29.20), #8 (08.19.20)  - s/p IVA OS #1 9.27.19, #2 (10.25.19), #3 (11.15.19), #4 (12.17.19), #5 (01.14.20), #6 (2.11.20), #7 (04.26.20), #8 (05.29.20), #9 (06.26.20), #10 (08.05.20)  - S/P PRP OS (09.20.19), (5.19.20), (08.19.20)  - S/P PRP OD (9.27.19 and 11.21.19), fill-in (04.14.20)  - FA (9.20.19) shows +NVE OU and  leaking MA and capillary nonperfusion  - repeat FA 11.15.19 shows NV regressing OU  - recommend IVA OD #9 today, 10.30.20 -- due to persistent IRF  - pt wishes to proceed  - pre-op: OD w/ VA stable at 20/25, but there is some preretinal fibrosis / tractional membranes just superior to disc and mild central DME  - now s/p 25g PPV+MP+10% C3F8 gas OD (09.03.20) -- ERM/PRF removal OD             - doing well              - fibrosis/ERM improved; retina attached  - gas bubble <1%             - IOP okay at 19             - cont  PF taper, 2x/day for 7 days, then 1x/day for 7 days, then stop                         PSO ung QID OD -- use at night / PRN             - avoid laying flat on back              - post op drop and positioning instructions reviewed              - tylenol/ibuprofen for pain  - f/u 4 weeks -- POV  3. Vitreous hemorrhage OS  - new onset 4.24.20  - etiology: secondary to PDR as described below (no RT/RD on exam)  - s/p IVA OS on 4.26.20, 5.29.20, 6.26.20 and 8.5.20  - today, further VH improvement with just mild residual VH settled inferiorly -- center clear  - pt reports recent change in BP/DM meds   - BCVA stable at 20/25   - s/p PRP OS (9.20.19), (05.19.20), (08.19.0)  4. History of Endophthalmitis OD  - s/p IVA OU 04/09/2018  - s/p 25g PPV w/ intravitreal vanc, ceftaz and cefepime OD, 2.14.2020  - s/p intravitreal tap / vanc and ceflaz injections (02.16.20)             - gram stain (2.14.20) shows G+ cocci, WBCs mostly PMNs;   - repeat gram stain from t/i (2.16.20) -- no organisms, just WBCs             - cultures from vitreous grew rare Staph warneri; cultures from t/i -- no growth             - doing well, BCVA 20/30             - inflammation/posterior debris resolved             - IOP 19 off Brimonidine  - completed po pred taper -- caused significant elevations in BG  - monitor  5. History of Vitreous Hemorrhage OD --  cleared from PPV x2 for endophthalmitis  and ERM/preretinal fibrosis  - secondary to PDR as described below  - S/P IVA OD #1 (09.20.19), #2 (10.25.19), #3 (11.15.19), #4 (12.16.19), #5 (03/12/2018)  - S/P PRP OD #1 (09.27.19), fill in OD (11.21.19) -- each somewhat limited inferiorly by residual VH  6,7. Hypertensive retinopathy OU  - discussed importance of tight BP control.  - monitor for now  8. Combined form age related cataract OU-   - The symptoms of cataract, surgical options, and treatments and risks were discussed with patient.  - discussed diagnosis and progression -- in particular likely progression of cataract OD following PPV  - PSC OD becoming visually significant  - will clear for cataract eval after next visit   Ophthalmic Meds Ordered this visit:  Meds ordered this encounter  Medications  . prednisoLONE acetate (PRED FORTE) 1 % ophthalmic suspension    Sig: Place 1 drop into the right eye 2 (two) times daily.    Dispense:  10 mL    Refill:  0  . Bevacizumab (AVASTIN) SOLN 1.25 mg       Return in about 4 weeks (around 01/24/2019) for f/u PDR OU, DFE, OCT.  There are no Patient Instructions on file for this visit.   Explained the diagnoses, plan, and follow up with the patient and they expressed understanding.  Patient expressed understanding of the importance of proper follow up care.   This document serves as a record of services personally performed by Gardiner Sleeper, MD, PhD. It was created on their behalf by Ernest Mallick, OA, an ophthalmic assistant. The creation of this record is the provider's dictation and/or activities during the visit.    Electronically signed by: Ernest Mallick, OA 10.28.2020 12:55 AM   Gardiner Sleeper, M.D., Ph.D. Diseases & Surgery of the Retina and Vitreous [facility]  I have reviewed the above documentation for accuracy and completeness, and I agree with the above. Gardiner Sleeper, M.D., Ph.D. 12/28/18 12:59 AM    Abbreviations: M myopia  (nearsighted); A astigmatism; H hyperopia (farsighted); P presbyopia; Mrx spectacle prescription;  CTL contact lenses; OD right eye; OS left eye; OU both eyes  XT exotropia; ET esotropia; PEK punctate epithelial keratitis; PEE punctate epithelial erosions; DES dry eye syndrome; MGD meibomian gland dysfunction; ATs artificial tears; PFAT's preservative free artificial tears; [place] nuclear sclerotic cataract; PSC posterior subcapsular cataract; ERM epi-retinal membrane; PVD posterior vitreous detachment; RD retinal detachment; DM diabetes mellitus; DR diabetic retinopathy; NPDR non-proliferative diabetic retinopathy; PDR proliferative diabetic retinopathy; CSME clinically significant macular edema; DME diabetic macular edema; dbh dot blot hemorrhages; CWS cotton wool spot; POAG primary open angle glaucoma; C/D cup-to-disc ratio; HVF humphrey visual field; GVF goldmann visual field; OCT optical coherence tomography; IOP intraocular pressure; BRVO Branch retinal vein occlusion; CRVO central retinal vein occlusion; CRAO central retinal artery occlusion; BRAO branch retinal artery occlusion; RT retinal tear; SB scleral buckle; PPV pars plana vitrectomy; VH Vitreous hemorrhage; PRP panretinal laser photocoagulation; IVK intravitreal kenalog; VMT vitreomacular traction; MH Macular hole;  NVD neovascularization of the disc; NVE neovascularization elsewhere; AREDS age related eye disease study; ARMD age related macular degeneration; POAG primary open angle glaucoma; EBMD epithelial/anterior basement membrane dystrophy; ACIOL anterior chamber intraocular lens; IOL intraocular lens; PCIOL posterior chamber intraocular lens; Phaco/IOL phacoemulsification with intraocular lens placement; [place] photorefractive keratectomy; LASIK laser assisted in situ keratomileusis; HTN hypertension; DM diabetes mellitus; COPD chronic obstructive pulmonary disease

## 2018-12-27 ENCOUNTER — Encounter (INDEPENDENT_AMBULATORY_CARE_PROVIDER_SITE_OTHER): Payer: Self-pay | Admitting: Ophthalmology

## 2018-12-27 ENCOUNTER — Ambulatory Visit (INDEPENDENT_AMBULATORY_CARE_PROVIDER_SITE_OTHER): Payer: BLUE CROSS/BLUE SHIELD | Admitting: Ophthalmology

## 2018-12-27 DIAGNOSIS — H4312 Vitreous hemorrhage, left eye: Secondary | ICD-10-CM

## 2018-12-27 DIAGNOSIS — H3581 Retinal edema: Secondary | ICD-10-CM | POA: Diagnosis not present

## 2018-12-27 DIAGNOSIS — H44001 Unspecified purulent endophthalmitis, right eye: Secondary | ICD-10-CM | POA: Diagnosis not present

## 2018-12-27 DIAGNOSIS — E113513 Type 2 diabetes mellitus with proliferative diabetic retinopathy with macular edema, bilateral: Secondary | ICD-10-CM

## 2018-12-27 DIAGNOSIS — H4311 Vitreous hemorrhage, right eye: Secondary | ICD-10-CM

## 2018-12-27 DIAGNOSIS — H25813 Combined forms of age-related cataract, bilateral: Secondary | ICD-10-CM

## 2018-12-27 DIAGNOSIS — I10 Essential (primary) hypertension: Secondary | ICD-10-CM

## 2018-12-27 DIAGNOSIS — H35033 Hypertensive retinopathy, bilateral: Secondary | ICD-10-CM

## 2018-12-27 MED ORDER — PREDNISOLONE ACETATE 1 % OP SUSP: 1.0000 [drp] | Freq: Two times a day (BID) | OPHTHALMIC | 0 refills | Status: DC

## 2018-12-27 MED ORDER — BEVACIZUMAB CHEMO INJECTION 1.25MG/0.05ML SYRINGE FOR KALEIDOSCOPE
1.2500 mg | INTRAVITREAL | Status: AC | PRN
  Administered 2018-12-27: 1.25 mg via INTRAVITREAL

## 2018-12-28 ENCOUNTER — Encounter (INDEPENDENT_AMBULATORY_CARE_PROVIDER_SITE_OTHER): Payer: Self-pay | Admitting: Ophthalmology

## 2019-01-02 ENCOUNTER — Other Ambulatory Visit: Payer: Self-pay | Admitting: Family Medicine

## 2019-01-02 NOTE — Telephone Encounter (Signed)
Tried contacting pt to schedule f/u appt, last OV was 06/2018 and was advised to f/u in 3 months.

## 2019-01-03 NOTE — Telephone Encounter (Signed)
Pt has been scheduled for Monday 11/9

## 2019-01-06 ENCOUNTER — Ambulatory Visit (INDEPENDENT_AMBULATORY_CARE_PROVIDER_SITE_OTHER): Payer: BLUE CROSS/BLUE SHIELD | Admitting: Family Medicine

## 2019-01-06 ENCOUNTER — Other Ambulatory Visit: Payer: Self-pay

## 2019-01-06 ENCOUNTER — Encounter: Payer: Self-pay | Admitting: Family Medicine

## 2019-01-06 VITALS — BP 128/84 | HR 88 | Wt 162.0 lb

## 2019-01-06 DIAGNOSIS — D649 Anemia, unspecified: Secondary | ICD-10-CM

## 2019-01-06 DIAGNOSIS — E7800 Pure hypercholesterolemia, unspecified: Secondary | ICD-10-CM | POA: Diagnosis not present

## 2019-01-06 DIAGNOSIS — I10 Essential (primary) hypertension: Secondary | ICD-10-CM | POA: Diagnosis not present

## 2019-01-06 DIAGNOSIS — E118 Type 2 diabetes mellitus with unspecified complications: Secondary | ICD-10-CM

## 2019-01-06 DIAGNOSIS — IMO0002 Reserved for concepts with insufficient information to code with codable children: Secondary | ICD-10-CM

## 2019-01-06 DIAGNOSIS — E1165 Type 2 diabetes mellitus with hyperglycemia: Secondary | ICD-10-CM

## 2019-01-06 NOTE — Progress Notes (Signed)
Virtual Visit via Video Note  I connected with Gloria Gomez on 01/06/19 at  4:00 PM EST by a video enabled telemedicine application and verified that I am speaking with the correct person using two identifiers.  Location patient: home Location provider:work or home office Persons participating in the virtual visit: patient, provider  I discussed the limitations of evaluation and management by telemedicine and the availability of in person appointments. The patient expressed understanding and agreed to proceed.  Telemedicine visit is a necessity given the COVID-19 restrictions in place at the current time.  HPI: 48 y/o WF being seen today for f/u DM 2, HTN, HLD.  DM: Last visit I recommended starting lantus b/c A1c still not at goal but she declined, wanting to work more on diet/exercise and also she had reported improved glucoses since getting on janumet. Interim hx: She feels like her diet is pretty good diabetic diet.  She has signif limitation of activity b/c of chronic eye condition that has required several surgeries and injections. [place] 109-144 lately.  Only occ check hs and it was 120.  HTN: bp's well controlled, consistently 120s/70s.  HLD: last visit I started her on crestor.  Started on this 4-6 wks ago.  Has some chronic musculoskeletal pain and stiffness, more or less diffuse.  Lately more in L shoulder, but prior to that her hip hurt for a while (now doesn't bother her).  .   No swollen joints.  No abnl wt loss, no fevers.  No rashes or oral ulcers.  ROS: See pertinent positives and negatives per HPI.  Past Medical History:  Diagnosis Date  . Abdominal bloating    likely from diab gastroparesis.  Dr. Havery Moros to do EGD as of 10/2017 GI eval.  . Benign brain tumor ([place])    Cystic lesion in cerebral aqueduct region with mild hydrocephalus-- stable MRI 02/2016.  Surveillance MRI 05/2017 --dilated cerebral aqueduct related to aqueductal stenosis and subsequent mild hydrocephalus  (due to the 11 mm stable cystic lesion in cerebral aqueduct---?congenitial?.  . Cataract    OU  . Dysmenorrhea    vicodin occ during first 2 days of cycle.  . Gluten intolerance    Gloria Gomez reports she underwent full GI w/u to r/o celiac dz  . Hepatic steatosis    ultrasound 08/2017  . Hyperlipidemia, mixed   . Hypertensive retinopathy    OU  . Hypertensive retinopathy of both eyes   . Insomnia   . Proliferative diabetic retinopathy of both eyes ([place])    steroid injections 10/2017--improved  . Sensorineural hearing loss of left ear    Sudden left hearing loss summer 2016--no improvement with steroids 01/2015 so brain MRI done by Dr. Redmond Baseman and it showed brain tumor that was determined to be benign.  Gloria Gomez's hearing not bad enough for hearing aid as of 06/2016.  . Type 2 diabetes with complication ([place])    diab retpthy, diabetic gastroparesis (gastric emptying study mildly abnl 03/2017).  Recommended lantus 08/2018 but Gloria Gomez declined.  . White coat hypertension     Past Surgical History:  Procedure Laterality Date  . CHOLECYSTECTOMY  2000  . GAS INSERTION Right 10/31/2018   Procedure: Insertion Of C3F8 Gas;  Surgeon: Bernarda Caffey, MD;  Location: [facility];  Service: Ophthalmology;  Laterality: Right;  . GASTRIC EMPTYING SCAN  04/20/2017   Mildly abnormal, particularly the 1st hour of emptying.  Marland Kitchen MEMBRANE PEEL Right 10/31/2018   Procedure: MEMBRANE PEEL;  Surgeon: Bernarda Caffey, MD;  Location: [facility];  Service: Ophthalmology;  Laterality: Right;  . PARS PLANA VITRECTOMY Right 04/12/2018   Procedure: Right PARS PLANA VITRECTOMY WITH 25 GAUGE with intravitreal antibiotics;  Surgeon: Bernarda Caffey, MD;  Location: [place];  Service: Ophthalmology;  Laterality: Right;  . PARS PLANA VITRECTOMY Right 10/31/2018   Procedure: PARS PLANA VITRECTOMY WITH 25 GAUGE;  Surgeon: Bernarda Caffey, MD;  Location: [place] [facility];  Service: Ophthalmology;  Laterality: Right;  . PHOTOCOAGULATION WITH LASER Right 10/31/2018   Procedure:  Photocoagulation With Laser;  Surgeon: Bernarda Caffey, MD;  Location: [facility];  Service: Ophthalmology;  Laterality: Right;    Family History  Problem Relation Age of Onset  . Brain cancer Mother   . Diabetes Father   . Diabetes Maternal Grandmother   . Cataracts Maternal Grandmother   . Cervical cancer Paternal Grandmother   . Colon cancer Maternal Grandfather 37  . Amblyopia Neg Hx   . Blindness Neg Hx   . Glaucoma Neg Hx   . Macular degeneration Neg Hx   . Retinal detachment Neg Hx   . Strabismus Neg Hx   . Retinitis pigmentosa Neg Hx     SOCIAL HX:  Social History   Socioeconomic History  . Marital status: Married    Spouse name: Not on file  . Number of children: Not on file  . Years of education: Not on file  . Highest education level: Not on file  Occupational History  . Not on file  Social Needs  . Financial resource strain: Not on file  . Food insecurity    Worry: Not on file    Inability: Not on file  . Transportation needs    Medical: Not on file    Non-medical: Not on file  Tobacco Use  . Smoking status: Never Smoker  . Smokeless tobacco: Never Used  Substance and Sexual Activity  . Alcohol use: No  . Drug use: No  . Sexual activity: Not on file  Lifestyle  . Physical activity    Days per week: Not on file    Minutes per session: Not on file  . Stress: Not on file  Relationships  . Social Herbalist on phone: Not on file    Gets together: Not on file    Attends religious service: Not on file    Active member of club or organization: Not on file    Attends meetings of clubs or organizations: Not on file    Relationship status: Not on file  Other Topics Concern  . Not on file  Social History Narrative   Married, no children..   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       Occup: [employer]   No T/A/Ds.     Current Outpatient Medications:  .  APPLE CIDER VINEGAR PO, Take 2 tablets by  mouth daily., Disp: , Rfl:  .  atropine 1 % ophthalmic solution, Place 1 drop into the right eye 2 (two) times daily., Disp: , Rfl:  .  glipiZIDE (GLUCOTROL XL) 10 MG 24 hr tablet, TAKE 1 TABLET BY MOUTH WITH BREAKFAST . APPOINTMENT REQUIRED FOR FUTURE REFILLS, Disp: 30 tablet, Rfl: 0 .  HYDROcodone-acetaminophen (NORCO/VICODIN) 5-325 MG tablet, Take 1-2 tablets by mouth every 6 (six) hours as needed for moderate pain. (Patient taking differently: Take 1 tablet by mouth every 12 (twelve) hours as needed for moderate pain. ), Disp: 40 tablet, Rfl: 0 .  INVOKANA 300 MG TABS tablet, TAKE 1  TABLET BY MOUTH ONCE DAILY BEFORE BREAKFAST, Disp: 30 tablet, Rfl: 0 .  metFORMIN (GLUCOPHAGE-XR) 500 MG 24 hr tablet, Take 2 tablets (1,000 mg total) by mouth 2 (two) times daily., Disp: 360 tablet, Rfl: 1 .  metoprolol succinate (TOPROL-XL) 100 MG 24 hr tablet, TAKE 1 TABLET BY MOUTH ONCE DAILY TAKE  WITH  OR  IMMEDIATELY  FOLLOWING  A  MEAL, Disp: 30 tablet, Rfl: 0 .  Multiple Vitamin (MULTIVITAMIN WITH MINERALS) TABS tablet, Take 1 tablet by mouth daily., Disp: , Rfl:  .  OVER THE COUNTER MEDICATION, Take 2 tablets by mouth daily. Neu Remedy, Disp: , Rfl:  .  pantoprazole (PROTONIX) 40 MG tablet, TAKE 1 TABLET BY MOUTH ONCE DAILY . APPOINTMENT REQUIRED FOR FUTURE REFILLS, Disp: 30 tablet, Rfl: 0 .  prednisoLONE acetate (PRED FORTE) 1 % ophthalmic suspension, Place 1 drop into the right eye 2 (two) times daily., Disp: 10 mL, Rfl: 0 .  promethazine (PHENERGAN) 12.5 MG tablet, 1-2 tabs po q6h prn nausea, Disp: 30 tablet, Rfl: 1 .  rosuvastatin (CRESTOR) 10 MG tablet, Take 1 tablet (10 mg total) by mouth daily., Disp: 30 tablet, Rfl: 4 .  sitaGLIPtin-metformin (JANUMET) 50-1000 MG tablet, Take 1 tablet by mouth 2 (two) times daily with a meal., Disp: 60 tablet, Rfl: 5 .  temazepam (RESTORIL) 15 MG capsule, Take 1 capsule (15 mg total) by mouth at bedtime as needed for sleep., Disp: 30 capsule, Rfl: 1 .  losartan  (COZAAR) 100 MG tablet, Take 1 tablet by mouth once daily (Patient not taking: Reported on 01/06/2019), Disp: 90 tablet, Rfl: 0  EXAM:  VITALS per patient if applicable: BP 123XX123 (BP Location: Left Arm, Patient Position: Sitting, Cuff Size: Normal)   Pulse 88   Wt 162 lb (73.5 kg)   BMI 27.81 kg/m    GENERAL: alert, oriented, appears well and in no acute distress  HEENT: atraumatic, conjunttiva clear, no obvious abnormalities on inspection of external nose and ears  NECK: normal movements of the head and neck  LUNGS: on inspection no signs of respiratory distress, breathing rate appears normal, no obvious gross SOB, gasping or wheezing  CV: no obvious cyanosis  MS: moves all visible extremities without noticeable abnormality  PSYCH/NEURO: pleasant and cooperative, no obvious depression or anxiety, speech and thought processing grossly intact  LABS: none today    Chemistry      Component Value Date/Time   NA 136 10/31/2018 0900   K 4.1 10/31/2018 0900   CL 105 10/31/2018 0900   CO2 19 (L) 10/31/2018 0900   BUN 11 10/31/2018 0900   CREATININE 0.57 10/31/2018 0900   CREATININE 0.60 08/27/2018 0954      Component Value Date/Time   CALCIUM 9.0 10/31/2018 0900   ALKPHOS 67 04/03/2018 1339   AST 17 04/03/2018 1339   ALT 23 04/03/2018 1339   BILITOT 0.2 04/03/2018 1339     Lab Results  Component Value Date   CHOL 184 08/27/2018   HDL 44 (L) 08/27/2018   LDLCALC 107 (H) 08/27/2018   LDLDIRECT 77.0 07/19/2016   TRIG 209 (H) 08/27/2018   CHOLHDL 4.2 08/27/2018   Lab Results  Component Value Date   WBC 12.1 (H) 10/31/2018   HGB 10.3 (L) 10/31/2018   HCT 33.4 (L) 10/31/2018   MCV 82.5 10/31/2018   PLT 445 (H) 10/31/2018  No results found for: IRON, TIBC, FERRITIN   Lab Results  Component Value Date   HGBA1C 8.6 (H) 08/27/2018  Lab Results  Component Value Date   TSH 1.65 08/22/2017    ASSESSMENT AND PLAN:  Discussed the following assessment and  plan:  1) DM 2, control improved per her report of home glucoses. HbA1c-future. Lytes/cr future. TSH screen.  2) HTN: The current medical regimen is effective;  continue present plan and medications. Lytes/cr -future.  3) HLD: tolerating crestor x 4-6 wks. Recheck FLP-future.  4) Normocytic anemia: review of last labs done in hosp when she had an eye surgery 10/2018, her Hb was down.  Will recheck CBC plus check iron panel and B12 level.   5) Fibromyalgia syndrome suspected: no red flags for rheum/connective tissue dz. Symptoms relatively well controlled with tylenol so I recommended she continue this for now.  I discussed the assessment and treatment plan with the patient. The patient was provided an opportunity to ask questions and all were answered. The patient agreed with the plan and demonstrated an understanding of the instructions.   The patient was advised to call back or seek an in-person evaluation if the symptoms worsen or if the condition fails to improve as anticipated.  F/u: 3 mo  Signed:  Crissie Sickles, MD           01/06/2019

## 2019-01-08 ENCOUNTER — Ambulatory Visit (INDEPENDENT_AMBULATORY_CARE_PROVIDER_SITE_OTHER): Payer: BLUE CROSS/BLUE SHIELD | Admitting: Ophthalmology

## 2019-01-08 ENCOUNTER — Encounter (INDEPENDENT_AMBULATORY_CARE_PROVIDER_SITE_OTHER): Payer: Self-pay | Admitting: Ophthalmology

## 2019-01-08 DIAGNOSIS — E113513 Type 2 diabetes mellitus with proliferative diabetic retinopathy with macular edema, bilateral: Secondary | ICD-10-CM

## 2019-01-08 DIAGNOSIS — H4312 Vitreous hemorrhage, left eye: Secondary | ICD-10-CM

## 2019-01-08 DIAGNOSIS — H44001 Unspecified purulent endophthalmitis, right eye: Secondary | ICD-10-CM

## 2019-01-08 DIAGNOSIS — H3581 Retinal edema: Secondary | ICD-10-CM

## 2019-01-08 DIAGNOSIS — H25813 Combined forms of age-related cataract, bilateral: Secondary | ICD-10-CM

## 2019-01-08 DIAGNOSIS — H35033 Hypertensive retinopathy, bilateral: Secondary | ICD-10-CM

## 2019-01-08 DIAGNOSIS — I10 Essential (primary) hypertension: Secondary | ICD-10-CM

## 2019-01-08 DIAGNOSIS — H4311 Vitreous hemorrhage, right eye: Secondary | ICD-10-CM

## 2019-01-08 NOTE — Progress Notes (Addendum)
[facility] Note  01/08/2019     CHIEF COMPLAINT Patient presents for Retina Evaluation   HISTORY OF PRESENT ILLNESS: Gloria Gomez is a 48 y.o. female who presents to the clinic today for:   HPI    Retina Evaluation    In left eye.  This started 1 day ago.  Duration of 1 day.  Associated Symptoms Floaters.  Negative for Flashes, Distortion, Pain, Photophobia, Trauma, Jaw Claudication, Fever, Fatigue, Blind Spot, Redness, Glare, Scalp Tenderness, Shoulder/Hip pain and Weight Loss.  Context:  distance vision, mid-range vision, near vision, reading, watching TV, computer work and driving.  Treatments tried include no treatments.  I, the attending physician,  performed the HPI with the patient and updated documentation appropriately.          Comments    Patient c/o sudden loss of vision OS started this am. Sees black squiggles in vision. No flashes. BS was 175 this am. Last a1c was 7.9. Was on prednisone for about 6 weeks, for inflammation. Off prednisone for the past 2 months. BP was 140/79 this am.        Last edited by Bernarda Caffey, MD on 01/08/2019  1:32 PM. (History)    pt states she rubbed her eye last night and started seeing strands of floaters, she states when they didn't clear up after a few minutes, she figured that her eye was bleeding again, pt states her blood pressure was 140/79 this morning   Referring physician:   HISTORICAL INFORMATION:   Selected notes from the MEDICAL RECORD NUMBER Referred for DM exam LEE:  Ocular Hx-NPDR PMH-DM (takes invokana and metformin), high cholesterol, benign brain tumor, white coat HTN    CURRENT MEDICATIONS: Current Outpatient Medications (Ophthalmic Drugs)  Medication Sig  . atropine 1 % ophthalmic solution Place 1 drop into the right eye 2 (two) times daily.  . prednisoLONE acetate (PRED FORTE) 1 % ophthalmic suspension Place 1 drop into the right eye 2 (two) times daily.   No current  facility-administered medications for this visit.  (Ophthalmic Drugs)   Current Outpatient Medications (Other)  Medication Sig  . APPLE CIDER VINEGAR PO Take 2 tablets by mouth daily.  Marland Kitchen glipiZIDE (GLUCOTROL XL) 10 MG 24 hr tablet TAKE 1 TABLET BY MOUTH WITH BREAKFAST . APPOINTMENT REQUIRED FOR FUTURE REFILLS  . HYDROcodone-acetaminophen (NORCO/VICODIN) 5-325 MG tablet Take 1-2 tablets by mouth every 6 (six) hours as needed for moderate pain. (Patient taking differently: Take 1 tablet by mouth every 12 (twelve) hours as needed for moderate pain. )  . INVOKANA 300 MG TABS tablet TAKE 1 TABLET BY MOUTH ONCE DAILY BEFORE BREAKFAST  . losartan (COZAAR) 100 MG tablet Take 1 tablet by mouth once daily  . metoprolol succinate (TOPROL-XL) 100 MG 24 hr tablet TAKE 1 TABLET BY MOUTH ONCE DAILY TAKE  WITH  OR  IMMEDIATELY  FOLLOWING  A  MEAL  . Multiple Vitamin (MULTIVITAMIN WITH MINERALS) TABS tablet Take 1 tablet by mouth daily.  Marland Kitchen OVER THE COUNTER MEDICATION Take 2 tablets by mouth daily. Neu Remedy  . pantoprazole (PROTONIX) 40 MG tablet TAKE 1 TABLET BY MOUTH ONCE DAILY . APPOINTMENT REQUIRED FOR FUTURE REFILLS  . promethazine (PHENERGAN) 12.5 MG tablet 1-2 tabs po q6h prn nausea  . rosuvastatin (CRESTOR) 10 MG tablet Take 1 tablet (10 mg total) by mouth daily.  . sitaGLIPtin-metformin (JANUMET) 50-1000 MG tablet Take 1 tablet by mouth 2 (two) times daily with a meal.  .  temazepam (RESTORIL) 15 MG capsule Take 1 capsule (15 mg total) by mouth at bedtime as needed for sleep.   No current facility-administered medications for this visit.  (Other)      REVIEW OF SYSTEMS: ROS    Positive for: Endocrine, Eyes   Negative for: Constitutional, Gastrointestinal, Neurological, Skin, Genitourinary, Musculoskeletal, HENT, Cardiovascular, Respiratory, Psychiatric, Allergic/Imm, Heme/Lymph   Last edited by Roselee Nova D, COT on 01/08/2019 12:39 PM. (History)       ALLERGIES Allergies  Allergen  Reactions  . Gluten Meal Swelling  . Lisinopril Cough  . Pioglitazone Other (See Comments)    ELEVATED glucoses + worse chronic nausea    PAST MEDICAL HISTORY Past Medical History:  Diagnosis Date  . Abdominal bloating    likely from diab gastroparesis.  Dr. Havery Moros to do EGD as of 10/2017 GI eval.  . Benign brain tumor ([place])    Cystic lesion in cerebral aqueduct region with mild hydrocephalus-- stable MRI 02/2016.  Surveillance MRI 05/2017 --dilated cerebral aqueduct related to aqueductal stenosis and subsequent mild hydrocephalus (due to the 11 mm stable cystic lesion in cerebral aqueduct---?congenitial?.  . Cataract    OU  . Dysmenorrhea    vicodin occ during first 2 days of cycle.  . Gluten intolerance    pt reports she underwent full GI w/u to r/o celiac dz  . Hepatic steatosis    ultrasound 08/2017  . Hyperlipidemia, mixed   . Hypertensive retinopathy    OU  . Hypertensive retinopathy of both eyes   . Insomnia   . Proliferative diabetic retinopathy of both eyes ([place])    steroid injections 10/2017--improved  . Sensorineural hearing loss of left ear    Sudden left hearing loss summer 2016--no improvement with steroids 01/2015 so brain MRI done by Dr. Redmond Baseman and it showed brain tumor that was determined to be benign.  Pt's hearing not bad enough for hearing aid as of 06/2016.  . Type 2 diabetes with complication ([place])    diab retpthy, diabetic gastroparesis (gastric emptying study mildly abnl 03/2017).  Recommended lantus 08/2018 but pt declined.  . White coat hypertension    Past Surgical History:  Procedure Laterality Date  . CHOLECYSTECTOMY  2000  . GAS INSERTION Right 10/31/2018   Procedure: Insertion Of C3F8 Gas;  Surgeon: Bernarda Caffey, MD;  Location: [facility];  Service: Ophthalmology;  Laterality: Right;  . GASTRIC EMPTYING SCAN  04/20/2017   Mildly abnormal, particularly the 1st hour of emptying.  Marland Kitchen MEMBRANE PEEL Right 10/31/2018   Procedure: MEMBRANE PEEL;  Surgeon: Bernarda Caffey, MD;  Location: [place];  Service: Ophthalmology;  Laterality: Right;  . PARS PLANA VITRECTOMY Right 04/12/2018   Procedure: Right PARS PLANA VITRECTOMY WITH 25 GAUGE with intravitreal antibiotics;  Surgeon: Bernarda Caffey, MD;  Location: [place];  Service: Ophthalmology;  Laterality: Right;  . PARS PLANA VITRECTOMY Right 10/31/2018   Procedure: PARS PLANA VITRECTOMY WITH 25 GAUGE;  Surgeon: Bernarda Caffey, MD;  Location: [facility];  Service: Ophthalmology;  Laterality: Right;  . PHOTOCOAGULATION WITH LASER Right 10/31/2018   Procedure: Photocoagulation With Laser;  Surgeon: Bernarda Caffey, MD;  Location: [place];  Service: Ophthalmology;  Laterality: Right;    FAMILY HISTORY Family History  Problem Relation Age of Onset  . Brain cancer Mother   . Diabetes Father   . Diabetes Maternal Grandmother   . Cataracts Maternal Grandmother   . Cervical cancer Paternal Grandmother   . Colon cancer Maternal Grandfather 56  . Amblyopia Neg  Hx   . Blindness Neg Hx   . Glaucoma Neg Hx   . Macular degeneration Neg Hx   . Retinal detachment Neg Hx   . Strabismus Neg Hx   . Retinitis pigmentosa Neg Hx     SOCIAL HISTORY Social History   Tobacco Use  . Smoking status: Never Smoker  . Smokeless tobacco: Never Used  Substance Use Topics  . Alcohol use: No  . Drug use: No         OPHTHALMIC EXAM:  Base Eye Exam    Visual Acuity (Snellen - Linear)      Right Left   Dist cc 20/200 +2 CF at 1'   Dist ph cc 20/150 -1 NI   Correction: Glasses       Tonometry (Tonopen, 12:47 PM)      Right Left   Pressure 21 19       Pupils      Dark Light Shape React APD   Right 5 4 Round Brisk None   Left 5 4 Round Brisk None       Visual Fields (Counting fingers)      Left Right     Full   Restrictions Total superior temporal, inferior temporal deficiencies        Extraocular Movement      Right Left    Full, Ortho Full, Ortho       Neuro/Psych    Oriented x3: Yes   Mood/Affect: Normal        Dilation    Both eyes: 1.0% Mydriacyl, 2.5% Phenylephrine @ 12:47 PM        Slit Lamp and Fundus Exam    Slit Lamp Exam      Right Left   Lids/Lashes Dermatochalasis - upper lid, Meibomian gland dysfunction, Telangiectasia Dermatochalasis - upper lid, Meibomian gland dysfunction, Telangiectasia   Conjunctiva/Sclera White and quiet, sutures intact White and quiet   Cornea Clear, trace Punctate epithelial erosions 1+Punctate epithelial erosions   Anterior Chamber moderate depth, narrow temporal angle Deep and quiet   Iris Round and dilated, No NVI Round and dilated, No NVI   Lens 2-3+ Nuclear sclerosis with early brunescence, 2+ Cortical cataract, 2+PSC 2+ Nuclear sclerosis, 2+ Cortical cataract   Vitreous post vitrectomy Vitreous syneresis, diffuse VH greatest centrally       Fundus Exam      Right Left   Disc Pink and Sharp, improved fibrosis  no details visible   C/D Ratio 0.2 0.1   Macula Flat, central edema / cystic changes -- improved, cluster of MA and exudates greatest centrally -- improved no details visible   Vessels mild Vascular attenuation, Tortuous, stable improvement in fibrosis along superior vessels No details visible   Periphery Attached, tractional fibrosis in SN quadrant removed, scattered DBH, 360 PRP, good laser fill in 360 Hazy, grossly attached, 360 PRP scars          IMAGING AND PROCEDURES  Imaging and Procedures for @TODAY @  OCT, Retina - OU - Both Eyes       Right Eye Quality was good. Central Foveal Thickness: 345. Progression has been stable. Findings include abnormal foveal contour, epiretinal membrane, no SRF, intraretinal hyper-reflective material, vitreous traction, preretinal fibrosis (Interval improvement in IRF).   Notes *Images captured and stored on drive  Diagnosis / Impression:  OD: interval improvement of IRF OS: no view   Clinical management:  See below  Abbreviations: NFP - Normal foveal profile. CME - cystoid macular edema.  PED - pigment epithelial detachment. IRF - intraretinal fluid. SRF - subretinal fluid. EZ - ellipsoid zone. ERM - epiretinal membrane. ORA - outer retinal atrophy. ORT - outer retinal tubulation. SRHM - subretinal hyper-reflective material         Intravitreal Injection, Pharmacologic Agent - OS - Left Eye       Time Out 01/08/2019. 2:06 PM. Confirmed correct patient, procedure, site, and patient consented.   Anesthesia Topical anesthesia was used. Anesthetic medications included Lidocaine 2%, Proparacaine 0.5%.   Procedure Preparation included 5% betadine to ocular surface, eyelid speculum. A 30 gauge needle was used.   Injection:  1.25 mg Bevacizumab (AVASTIN) SOLN   NDC: [id number], Lot: [id number] , Expiration date: 03/18/2019   Route: Intravitreal, Site: Left Eye, Waste: 0 mL  Post-op Post injection exam found visual acuity of at least counting fingers. The patient tolerated the procedure well. There were no complications. The patient received written and verbal post procedure care education.                ASSESSMENT/PLAN:    ICD-10-CM   1. Proliferative diabetic retinopathy of both eyes with macular edema associated with type 2 diabetes mellitus (HCC)  03/31/2019 Intravitreal Injection, Pharmacologic Agent - OS - Left Eye    Bevacizumab (AVASTIN) SOLN 1.25 mg  2. Retinal edema  H35.81 OCT, Retina - OU - Both Eyes  3. Vitreous hemorrhage of left eye (HCC)  H43.12 Intravitreal Injection, Pharmacologic Agent - OS - Left Eye    Bevacizumab (AVASTIN) SOLN 1.25 mg  4. Right endophthalmia  H44.001   5. Vitreous hemorrhage, right eye ([place])  H43.11   6. Essential hypertension  I10   7. Hypertensive retinopathy of both eyes  H35.033   8. Combined forms of age-related cataract of both eyes  H25.813     1,2.  Proliferative diabetic retinopathy w/ DME, OU  - HbA1c 8.6% (06.30.20) from 8.4 (02.14.20), but pt reports better control of BG with recent medication adjustments  -- added Janumet  - s/p IVA OD #1 9.20.19, #2 (10.25.19), #3 (11.15.19), #4 (12.17.19), #5 (01.14.20), #6 (2.11.20), #7 (05.29.20), #8 (08.19.20), #9 (10.30.20)  - s/p IVA OS #1 9.27.19, #2 (10.25.19), #3 (11.15.19), #4 (12.17.19), #5 (01.14.20), #6 (2.11.20), #7 (04.26.20), #8 (05.29.20), #9 (06.26.20), #10 (08.05.20)  - S/P PRP OS (09.20.19), (5.19.20), (08.19.20)  - S/P PRP OD (9.27.19 and 11.21.19), fill-in (04.14.20)  - FA (9.20.19) shows +NVE OU and leaking MA and capillary nonperfusion  - repeat FA 11.15.19 shows NV regressing OU  - pre-op: OD w/ VA stable at 20/25, but there is some preretinal fibrosis / tractional membranes just superior to disc and mild central DME  - now s/p 25g PPV+MP+10% C3F8 gas OD (09.03.20) -- ERM/PRF removal OD             - doing well              - fibrosis/ERM improved; retina attached             - IOP okay at 21             - cont  PF 1x/day until next appt                         PSO ung QID OD -- use at night / PRN             - gas bubble gone  - f/u November 30 -- POV as  scheduled  3. Vitreous hemorrhage OS  - here for recurrent VH, onset 11.11.20  - initial onset 4.24.20  - etiology: secondary to PDR as described below (no RT/RD on exam)  - today, pt reports new cloud of floaters  - BCVA decreased to CF at 1' from 20/25  - s/p PRP OS (9.20.19), (05.19.20), (08.19.0)  - s/p IVA OS on 4.26.20, 5.29.20, 6.26.20 and 8.5.20  - recommend IVA OS #5 today, 11.11.20  - pt wishes to proceed  - RBA of procedure discussed, questions answered  - informed consent obtained and signed  - see procedure note  - f/u November 30  4. History of Endophthalmitis OD  - s/p IVA OU 04/09/2018  - s/p 25g PPV w/ intravitreal vanc, ceftaz and cefepime OD, 2.14.2020  - s/p intravitreal tap / vanc and ceflaz injections (02.16.20)             - gram stain (2.14.20) shows G+ cocci, WBCs mostly PMNs;   - repeat gram stain from t/i (2.16.20) -- no organisms, just WBCs              - cultures from vitreous grew rare Staph warneri; cultures from t/i -- no growth             - doing well, BCVA 20/30             - inflammation/posterior debris resolved             - IOP 21 off Brimonidine  - completed po pred taper -- caused significant elevations in BG  - monitor  5. History of Vitreous Hemorrhage OD -- cleared from PPV x2 for endophthalmitis and ERM/preretinal fibrosis  - secondary to PDR as described below  - S/P IVA OD #1 (09.20.19), #2 (10.25.19), #3 (11.15.19), #4 (12.16.19), #5 (03/12/2018)  - S/P PRP OD #1 (09.27.19), fill in OD (11.21.19) -- each somewhat limited inferiorly by residual VH  6,7. Hypertensive retinopathy OU  - discussed importance of tight BP control.  - monitor for now  8. Combined form age related cataract OU-   - The symptoms of cataract, surgical options, and treatments and risks were discussed with patient.  - discussed diagnosis and progression -- in particular likely progression of cataract OD following PPV  - PSC OD becoming visually significant  - will clear for cataract eval once VH OS clears   Ophthalmic Meds Ordered this visit:  Meds ordered this encounter  Medications  . Bevacizumab (AVASTIN) SOLN 1.25 mg       Return for November 30 as scheduled.  There are no Patient Instructions on file for this visit.   Explained the diagnoses, plan, and follow up with the patient and they expressed understanding.  Patient expressed understanding of the importance of proper follow up care.   This document serves as a record of services personally performed by Gardiner Sleeper, MD, PhD. It was created on their behalf by Ernest Mallick, OA, an ophthalmic assistant. The creation of this record is the provider's dictation and/or activities during the visit.    Electronically signed by: Ernest Mallick, OA 11.11.2020 9:11 PM   Gardiner Sleeper, M.D., Ph.D. Diseases & Surgery of the Retina and Vitreous [facility]  I have reviewed the above documentation for accuracy and completeness, and I agree with the above. Gardiner Sleeper, M.D., Ph.D. 01/12/19 9:11 PM   Abbreviations: M myopia (nearsighted); A astigmatism; H hyperopia (farsighted); P presbyopia; Mrx spectacle  prescription;  CTL contact lenses; OD right eye; OS left eye; OU both eyes  XT exotropia; ET esotropia; PEK punctate epithelial keratitis; PEE punctate epithelial erosions; DES dry eye syndrome; MGD meibomian gland dysfunction; ATs artificial tears; PFAT's preservative free artificial tears; [place] nuclear sclerotic cataract; PSC posterior subcapsular cataract; ERM epi-retinal membrane; PVD posterior vitreous detachment; RD retinal detachment; DM diabetes mellitus; DR diabetic retinopathy; NPDR non-proliferative diabetic retinopathy; PDR proliferative diabetic retinopathy; CSME clinically significant macular edema; DME diabetic macular edema; dbh dot blot hemorrhages; CWS cotton wool spot; POAG primary open angle glaucoma; C/D cup-to-disc ratio; HVF humphrey visual field; GVF goldmann visual field; OCT optical coherence tomography; IOP intraocular pressure; BRVO Branch retinal vein occlusion; CRVO central retinal vein occlusion; CRAO central retinal artery occlusion; BRAO branch retinal artery occlusion; RT retinal tear; SB scleral buckle; PPV pars plana vitrectomy; VH Vitreous hemorrhage; PRP panretinal laser photocoagulation; IVK intravitreal kenalog; VMT vitreomacular traction; MH Macular hole;  NVD neovascularization of the disc; NVE neovascularization elsewhere; AREDS age related eye disease study; ARMD age related macular degeneration; POAG primary open angle glaucoma; EBMD epithelial/anterior basement membrane dystrophy; ACIOL anterior chamber intraocular lens; IOL intraocular lens; PCIOL posterior chamber intraocular lens; Phaco/IOL phacoemulsification with intraocular lens placement; [place] photorefractive keratectomy; LASIK laser assisted in situ  keratomileusis; HTN hypertension; DM diabetes mellitus; COPD chronic obstructive pulmonary disease

## 2019-01-12 MED ORDER — BEVACIZUMAB CHEMO INJECTION 1.25MG/0.05ML SYRINGE FOR KALEIDOSCOPE
1.2500 mg | INTRAVITREAL | Status: AC | PRN
  Administered 2019-01-12: 1.25 mg via INTRAVITREAL

## 2019-01-14 ENCOUNTER — Other Ambulatory Visit: Payer: Self-pay | Admitting: Family Medicine

## 2019-01-15 NOTE — Progress Notes (Signed)
[facility] Note  01/27/2019     CHIEF COMPLAINT Patient presents for Retina Follow Up   HISTORY OF PRESENT ILLNESS: Gloria Gomez is a 48 y.o. female who presents to the clinic today for:   HPI    Retina Follow Up    In both eyes.  Duration of 4 weeks.  Since onset it is gradually improving.  I, the attending physician,  performed the HPI with the patient and updated documentation appropriately.          Comments    Patient states vision improving OS. Vision good OD for near, but can't see well at distance. BS was 124 this am. Thinks last a1c was 7.9 (better), but patient can't remember exact number.        Last edited by Bernarda Caffey, MD on 01/27/2019  8:38 PM. (History)    pt states she is doing well, she still sees a few floaters in her left eye   Referring physician:   HISTORICAL INFORMATION:   Selected notes from the MEDICAL RECORD NUMBER Referred for DM exam LEE:  Ocular Hx-NPDR PMH-DM (takes invokana and metformin), high cholesterol, benign brain tumor, white coat HTN    CURRENT MEDICATIONS: Current Outpatient Medications (Ophthalmic Drugs)  Medication Sig  . prednisoLONE acetate (PRED FORTE) 1 % ophthalmic suspension Place 1 drop into the right eye 2 (two) times daily. (Patient taking differently: Place 1 drop into the right eye daily. )  . atropine 1 % ophthalmic solution Place 1 drop into the right eye 2 (two) times daily.   No current facility-administered medications for this visit.  (Ophthalmic Drugs)   Current Outpatient Medications (Other)  Medication Sig  . APPLE CIDER VINEGAR PO Take 2 tablets by mouth daily.  . canagliflozin (INVOKANA) 300 MG TABS tablet Take 1 tablet (300 mg total) by mouth daily before breakfast.  . glipiZIDE (GLUCOTROL XL) 10 MG 24 hr tablet TAKE 1 TABLET BY MOUTH WITH BREAKFAST . APPOINTMENT REQUIRED FOR FUTURE REFILLS  . HYDROcodone-acetaminophen (NORCO/VICODIN) 5-325 MG tablet Take 1-2  tablets by mouth every 6 (six) hours as needed for moderate pain. (Patient taking differently: Take 1 tablet by mouth every 12 (twelve) hours as needed for moderate pain. )  . losartan (COZAAR) 100 MG tablet Take 1 tablet by mouth once daily  . metoprolol succinate (TOPROL-XL) 100 MG 24 hr tablet TAKE 1 TABLET BY MOUTH ONCE DAILY TAKE  WITH  OR  IMMEDIATELY  FOLLOWING  A  MEAL  . Multiple Vitamin (MULTIVITAMIN WITH MINERALS) TABS tablet Take 1 tablet by mouth daily.  Marland Kitchen OVER THE COUNTER MEDICATION Take 2 tablets by mouth daily. Neu Remedy  . pantoprazole (PROTONIX) 40 MG tablet TAKE 1 TABLET BY MOUTH ONCE DAILY . APPOINTMENT REQUIRED FOR FUTURE REFILLS  . promethazine (PHENERGAN) 12.5 MG tablet 1-2 tabs po q6h prn nausea  . rosuvastatin (CRESTOR) 10 MG tablet Take 1 tablet (10 mg total) by mouth daily.  . sitaGLIPtin-metformin (JANUMET) 50-1000 MG tablet Take 1 tablet by mouth 2 (two) times daily with a meal.  . temazepam (RESTORIL) 15 MG capsule Take 1 capsule (15 mg total) by mouth at bedtime as needed for sleep.   No current facility-administered medications for this visit.  (Other)      REVIEW OF SYSTEMS: ROS    Positive for: Endocrine, Eyes   Negative for: Constitutional, Gastrointestinal, Neurological, Skin, Genitourinary, Musculoskeletal, HENT, Cardiovascular, Respiratory, Psychiatric, Allergic/Imm, Heme/Lymph   Last edited by Jobe Marker,  COT on 01/27/2019  3:07 PM. (History)       ALLERGIES Allergies  Allergen Reactions  . Gluten Meal Swelling  . Lisinopril Cough  . Pioglitazone Other (See Comments)    ELEVATED glucoses + worse chronic nausea    PAST MEDICAL HISTORY Past Medical History:  Diagnosis Date  . Abdominal bloating    likely from diab gastroparesis.  Dr. Havery Moros to do EGD as of 10/2017 GI eval.  . Benign brain tumor ([place])    Cystic lesion in cerebral aqueduct region with mild hydrocephalus-- stable MRI 02/2016.  Surveillance MRI 05/2017 --dilated cerebral  aqueduct related to aqueductal stenosis and subsequent mild hydrocephalus (due to the 11 mm stable cystic lesion in cerebral aqueduct---?congenitial?.  . Cataract    OU  . Dysmenorrhea    vicodin occ during first 2 days of cycle.  . Gluten intolerance    pt reports she underwent full GI w/u to r/o celiac dz  . Hepatic steatosis    ultrasound 08/2017  . Hyperlipidemia, mixed   . Hypertensive retinopathy    OU  . Hypertensive retinopathy of both eyes   . Insomnia   . Proliferative diabetic retinopathy of both eyes ([place])    steroid injections 10/2017--improved  . Sensorineural hearing loss of left ear    Sudden left hearing loss summer 2016--no improvement with steroids 01/2015 so brain MRI done by Dr. Redmond Baseman and it showed brain tumor that was determined to be benign.  Pt's hearing not bad enough for hearing aid as of 06/2016.  . Type 2 diabetes with complication ([place])    diab retpthy, diabetic gastroparesis (gastric emptying study mildly abnl 03/2017).  Recommended lantus 08/2018 but pt declined.  . White coat hypertension    Past Surgical History:  Procedure Laterality Date  . CHOLECYSTECTOMY  2000  . GAS INSERTION Right 10/31/2018   Procedure: Insertion Of C3F8 Gas;  Surgeon: Bernarda Caffey, MD;  Location: [facility];  Service: Ophthalmology;  Laterality: Right;  . GASTRIC EMPTYING SCAN  04/20/2017   Mildly abnormal, particularly the 1st hour of emptying.  Marland Kitchen MEMBRANE PEEL Right 10/31/2018   Procedure: MEMBRANE PEEL;  Surgeon: Bernarda Caffey, MD;  [facility];  Service: Ophthalmology;  Laterality: Right;  . PARS PLANA VITRECTOMY Right 04/12/2018   Procedure: Right PARS PLANA VITRECTOMY WITH 25 GAUGE with intravitreal antibiotics;  Surgeon: Bernarda Caffey, MD;  Location: [facility];  Service: Ophthalmology;  Laterality: Right;  . PARS PLANA VITRECTOMY Right 10/31/2018   Procedure: PARS PLANA VITRECTOMY WITH 25 GAUGE;  Surgeon: Bernarda Caffey, MD;  Location: [facility];  Service: Ophthalmology;  Laterality:  Right;  . PHOTOCOAGULATION WITH LASER Right 10/31/2018   Procedure: Photocoagulation With Laser;  Surgeon: Bernarda Caffey, MD;  Location: [facility];  Service: Ophthalmology;  Laterality: Right;    FAMILY HISTORY Family History  Problem Relation Age of Onset  . Brain cancer Mother   . Diabetes Father   . Diabetes Maternal Grandmother   . Cataracts Maternal Grandmother   . Cervical cancer Paternal Grandmother   . Colon cancer Maternal Grandfather 55  . Amblyopia Neg Hx   . Blindness Neg Hx   . Glaucoma Neg Hx   . Macular degeneration Neg Hx   . Retinal detachment Neg Hx   . Strabismus Neg Hx   . Retinitis pigmentosa Neg Hx     SOCIAL HISTORY Social History   Tobacco Use  . Smoking status: Never Smoker  . Smokeless tobacco: Never Used  Substance Use Topics  .  Alcohol use: No  . Drug use: No         OPHTHALMIC EXAM:  Base Eye Exam    Visual Acuity (Snellen - Linear)      Right Left   Dist cc 20/100 -2 20/25 -2   Dist ph cc 20/40 +1 NI   Correction: Glasses       Tonometry (Tonopen, 3:17 PM)      Right Left   Pressure 20 22       Pupils      Dark Light Shape React APD   Right 5 4 Round Brisk None   Left 5 4 Round Brisk None       Visual Fields (Counting fingers)      Left Right    Full Full       Extraocular Movement      Right Left    Full, Ortho Full, Ortho       Neuro/Psych    Oriented x3: Yes   Mood/Affect: Normal       Dilation    Both eyes: 1.0% Mydriacyl, 2.5% Phenylephrine @ 3:17 PM        Slit Lamp and Fundus Exam    Slit Lamp Exam      Right Left   Lids/Lashes Dermatochalasis - upper lid, Meibomian gland dysfunction, Telangiectasia Dermatochalasis - upper lid, Meibomian gland dysfunction, Telangiectasia   Conjunctiva/Sclera White and quiet, sutures intact White and quiet   Cornea Clear, trace Punctate epithelial erosions 1+Punctate epithelial erosions   Anterior Chamber moderate depth, narrow temporal angle Deep and quiet   Iris Round  and dilated, No NVI Round and dilated, No NVI   Lens 2-3+ Nuclear sclerosis with early brunescence, 2+ Cortical cataract, 2+PSC 2+ Nuclear sclerosis, 2+ Cortical cataract   Vitreous post vitrectomy Vitreous syneresis, mild residual blood stained vitreous centrally, clearing and settling inferiorly, blood clots inferiorly       Fundus Exam      Right Left   Disc Pink and Sharp, improved fibrosis  Pink and sharp   C/D Ratio 0.2 0.1   Macula Flat, central edema / cystic changes -- improved, cluster of MA and exudates greatest centrally -- improved Flat, good foveal reflex, scattered MA and exudate, mild cystic changes, trace ERM nasally    Vessels mild Vascular attenuation, Tortuous, stable improvement in fibrosis along superior vessels Vascular attenuation   Periphery Attached, tractional fibrosis in SN quadrant removed, scattered IRH, 360 PRP, good laser fill in 360 Hazy, attached, 360 PRP scars        Refraction    Wearing Rx      Sphere Cylinder   Right -3.75 Sphere   Left -3.75 Sphere       Manifest Refraction      Sphere Cylinder Axis Dist VA   Right -6.25 +0.50 048 20/40-2   Left -4.25 sph  20/30-1          IMAGING AND PROCEDURES  Imaging and Procedures for @TODAY @  OCT, Retina - OU - Both Eyes       Right Eye Quality was good. Central Foveal Thickness: 340. Progression has been stable. Findings include abnormal foveal contour, epiretinal membrane, no SRF, intraretinal hyper-reflective material, vitreous traction, preretinal fibrosis (Mild Interval improvement in IRF).   Left Eye Quality was good. Central Foveal Thickness: 301. Progression has improved. Findings include normal foveal contour, intraretinal fluid, no SRF (Interval improvement in vitreous opacities; Mild ERM; mild residual vitreous opacities).   Notes *Images captured and stored  on drive  Diagnosis / Impression:  OD: mild interval improvement of IRF OS: interval improvement in vitreous opacities /  hemorrhage; Mild ERM; mild residual vitreous opacities   Clinical management:  See below  Abbreviations: NFP - Normal foveal profile. CME - cystoid macular edema. PED - pigment epithelial detachment. IRF - intraretinal fluid. SRF - subretinal fluid. EZ - ellipsoid zone. ERM - epiretinal membrane. ORA - outer retinal atrophy. ORT - outer retinal tubulation. SRHM - subretinal hyper-reflective material                 ASSESSMENT/PLAN:    ICD-10-CM   1. Proliferative diabetic retinopathy of both eyes with macular edema associated with type 2 diabetes mellitus ([place])  [id number]   2. Retinal edema  H35.81 OCT, Retina - OU - Both Eyes  3. Vitreous hemorrhage of left eye (HCC)  H43.12   4. Right endophthalmia  H44.001   5. Vitreous hemorrhage, right eye ([place])  H43.11   6. Essential hypertension  I10   7. Hypertensive retinopathy of both eyes  H35.033   8. Combined forms of age-related cataract of both eyes  H25.813   9. Vitreous hemorrhage of right eye (HCC)  H43.11     1,2.  Proliferative diabetic retinopathy w/ DME, OU  - HbA1c 8.6% (06.30.20) from 8.4 (02.14.20), but pt reports better control of BG with recent medication adjustments -- added Janumet  - s/p IVA OD #1 9.20.19, #2 (10.25.19), #3 (11.15.19), #4 (12.17.19), #5 (01.14.20), #6 (2.11.20), #7 (05.29.20), #8 (08.19.20), #9 (10.30.20)  - s/p IVA OS #1 9.27.19, #2 (10.25.19), #3 (11.15.19), #4 (12.17.19), #5 (01.14.20), #6 (2.11.20), #7 (04.26.20), #8 (05.29.20), #9 (06.26.20), #10 (08.05.20), #11 (11.11.20)  - S/P PRP OS (09.20.19), (5.19.20), (08.19.20)  - S/P PRP OD (9.27.19 and 11.21.19), fill-in (04.14.20)  - FA (9.20.19) shows +NVE OU and leaking MA and capillary nonperfusion  - repeat FA 11.15.19 shows NV regressing OU  - pre-op: OD w/ VA stable at 20/25, but there is some preretinal fibrosis / tractional membranes just superior to disc and mild central DME  - now s/p 25g PPV+MP+10% C3F8 gas OD (09.03.20) -- ERM/PRF  removal OD             - doing well  - BCVA 20/40 -- limited by [facility]             - fibrosis/ERM improved; retina attached             - IOP okay at 20             - cont  PF 1x/day until next appt                         PSO ung QID OD -- use at night / PRN  - f/u December 9 or later -- DFE/OCT/possible injection OU  3. Vitreous hemorrhage OS -- improving  - recurrent VH, onset 11.11.20  - initial onset 4.24.20  - etiology: secondary to PDR as described above (no RT/RD on exam)  - s/p PRP OS (9.20.19), (05.19.20), (08.19.20)  - s/p IVA OS on 4.26.20, 5.29.20, 6.26.20, 8.5.20, and 11.11.20  - today, vast improvement in diffuse VH post-IVA OS on 11.11.20 -- pt reports improvement in floaters  - BCVA back to 20/25  - may benefit from further PRP fill in OS  - f/u December 9 or later -- DFE/OCT/possible injection OU  4. History of Endophthalmitis OD  - s/p IVA  OU 04/09/2018  - s/p 25g PPV w/ intravitreal vanc, ceftaz and cefepime OD, 2.14.2020  - s/p intravitreal tap / vanc and ceflaz injections (02.16.20)             - gram stain (2.14.20) shows G+ cocci, WBCs mostly PMNs;   - repeat gram stain from t/i (2.16.20) -- no organisms, just WBCs             - cultures from vitreous grew rare Staph warneri; cultures from t/i -- no growth             - doing well, BCVA 20/30             - inflammation/posterior debris resolved             - IOP 20 off Brimonidine  - completed po pred taper -- caused significant elevations in BG  - monitor  5. History of Vitreous Hemorrhage OD -- cleared from PPV x2 for endophthalmitis and ERM/preretinal fibrosis  - secondary to PDR as described below  - S/P IVA OD #1 (09.20.19), #2 (10.25.19), #3 (11.15.19), #4 (12.16.19), #5 (03/12/2018)  - S/P PRP OD #1 (09.27.19), fill in OD (11.21.19) -- each somewhat limited inferiorly by residual VH  6,7. Hypertensive retinopathy OU  - discussed importance of tight BP control.  - monitor for now  8. Combined form  age related cataract OU-   - The symptoms of cataract, surgical options, and treatments and risks were discussed with patient.  - discussed diagnosis and progression -- in particular likely progression of cataract OD following PPV  - PSC OD becoming visually significant  - pt scheduled for cat consult with Dr. Katy Fitch next week  - will clear for cataract eval once VH OS clears   Ophthalmic Meds Ordered this visit:  No orders of the defined types were placed in this encounter.      Return in about 9 days (around 02/05/2019) for f/u PDR OU / VH OS, DFE, OCT.  There are no Patient Instructions on file for this visit.   Explained the diagnoses, plan, and follow up with the patient and they expressed understanding.  Patient expressed understanding of the importance of proper follow up care.   This document serves as a record of services personally performed by Gardiner Sleeper, MD, PhD. It was created on their behalf by Leeann Must, [place], a certified ophthalmic assistant. The creation of this record is the provider's dictation and/or activities during the visit.    Electronically signed by: Leeann Must, COA @TODAY @ 8:49 PM   This document serves as a record of services personally performed by Gardiner Sleeper, MD, PhD. It was created on their behalf by Ernest Mallick, OA, an ophthalmic assistant. The creation of this record is the provider's dictation and/or activities during the visit.    Electronically signed by: Ernest Mallick, OA 11.30.2020 8:49 PM   Gardiner Sleeper, M.D., Ph.D. Diseases & Surgery of the Retina and Vitreous [facility]  I have reviewed the above documentation for accuracy and completeness, and I agree with the above. Gardiner Sleeper, M.D., Ph.D. 01/27/19 8:49 PM    Abbreviations: M myopia (nearsighted); A astigmatism; H hyperopia (farsighted); P presbyopia; Mrx spectacle prescription;  CTL contact lenses; OD right eye; OS left eye; OU both  eyes  XT exotropia; ET esotropia; PEK punctate epithelial keratitis; PEE punctate epithelial erosions; DES dry eye syndrome; MGD meibomian gland dysfunction; ATs artificial tears; PFAT's preservative free  artificial tears; [place] nuclear sclerotic cataract; PSC posterior subcapsular cataract; ERM epi-retinal membrane; PVD posterior vitreous detachment; RD retinal detachment; DM diabetes mellitus; DR diabetic retinopathy; NPDR non-proliferative diabetic retinopathy; PDR proliferative diabetic retinopathy; CSME clinically significant macular edema; DME diabetic macular edema; dbh dot blot hemorrhages; CWS cotton wool spot; POAG primary open angle glaucoma; C/D cup-to-disc ratio; HVF humphrey visual field; GVF goldmann visual field; OCT optical coherence tomography; IOP intraocular pressure; BRVO Branch retinal vein occlusion; CRVO central retinal vein occlusion; CRAO central retinal artery occlusion; BRAO branch retinal artery occlusion; RT retinal tear; SB scleral buckle; PPV pars plana vitrectomy; VH Vitreous hemorrhage; PRP panretinal laser photocoagulation; IVK intravitreal kenalog; VMT vitreomacular traction; MH Macular hole;  NVD neovascularization of the disc; NVE neovascularization elsewhere; AREDS age related eye disease study; ARMD age related macular degeneration; POAG primary open angle glaucoma; EBMD epithelial/anterior basement membrane dystrophy; ACIOL anterior chamber intraocular lens; IOL intraocular lens; PCIOL posterior chamber intraocular lens; Phaco/IOL phacoemulsification with intraocular lens placement; [place] photorefractive keratectomy; LASIK laser assisted in situ keratomileusis; HTN hypertension; DM diabetes mellitus; COPD chronic obstructive pulmonary disease

## 2019-01-24 ENCOUNTER — Other Ambulatory Visit: Payer: Self-pay | Admitting: Family Medicine

## 2019-01-25 ENCOUNTER — Other Ambulatory Visit: Payer: Self-pay | Admitting: Family Medicine

## 2019-01-27 ENCOUNTER — Encounter (INDEPENDENT_AMBULATORY_CARE_PROVIDER_SITE_OTHER): Payer: Self-pay | Admitting: Ophthalmology

## 2019-01-27 ENCOUNTER — Ambulatory Visit (INDEPENDENT_AMBULATORY_CARE_PROVIDER_SITE_OTHER): Payer: BLUE CROSS/BLUE SHIELD | Admitting: Ophthalmology

## 2019-01-27 ENCOUNTER — Other Ambulatory Visit: Payer: Self-pay

## 2019-01-27 DIAGNOSIS — H35033 Hypertensive retinopathy, bilateral: Secondary | ICD-10-CM

## 2019-01-27 DIAGNOSIS — H3581 Retinal edema: Secondary | ICD-10-CM | POA: Diagnosis not present

## 2019-01-27 DIAGNOSIS — E113513 Type 2 diabetes mellitus with proliferative diabetic retinopathy with macular edema, bilateral: Secondary | ICD-10-CM

## 2019-01-27 DIAGNOSIS — H4312 Vitreous hemorrhage, left eye: Secondary | ICD-10-CM

## 2019-01-27 DIAGNOSIS — H25813 Combined forms of age-related cataract, bilateral: Secondary | ICD-10-CM

## 2019-01-27 DIAGNOSIS — H4311 Vitreous hemorrhage, right eye: Secondary | ICD-10-CM

## 2019-01-27 DIAGNOSIS — H44001 Unspecified purulent endophthalmitis, right eye: Secondary | ICD-10-CM

## 2019-01-27 DIAGNOSIS — I10 Essential (primary) hypertension: Secondary | ICD-10-CM

## 2019-01-28 DIAGNOSIS — E611 Iron deficiency: Secondary | ICD-10-CM

## 2019-01-28 HISTORY — DX: Iron deficiency: E61.1

## 2019-01-29 ENCOUNTER — Other Ambulatory Visit: Payer: Self-pay | Admitting: Family Medicine

## 2019-01-29 ENCOUNTER — Other Ambulatory Visit: Payer: Self-pay

## 2019-01-29 ENCOUNTER — Ambulatory Visit (INDEPENDENT_AMBULATORY_CARE_PROVIDER_SITE_OTHER): Payer: BLUE CROSS/BLUE SHIELD | Admitting: Family Medicine

## 2019-01-29 DIAGNOSIS — I10 Essential (primary) hypertension: Secondary | ICD-10-CM

## 2019-01-29 DIAGNOSIS — E118 Type 2 diabetes mellitus with unspecified complications: Secondary | ICD-10-CM | POA: Diagnosis not present

## 2019-01-29 DIAGNOSIS — D649 Anemia, unspecified: Secondary | ICD-10-CM

## 2019-01-29 DIAGNOSIS — IMO0002 Reserved for concepts with insufficient information to code with codable children: Secondary | ICD-10-CM

## 2019-01-29 DIAGNOSIS — E7800 Pure hypercholesterolemia, unspecified: Secondary | ICD-10-CM

## 2019-01-29 DIAGNOSIS — E1165 Type 2 diabetes mellitus with hyperglycemia: Secondary | ICD-10-CM | POA: Diagnosis not present

## 2019-01-29 NOTE — Addendum Note (Signed)
Addended by: Deveron Furlong D on: 01/29/2019 11:00 AM   Modules accepted: Orders

## 2019-01-29 NOTE — Telephone Encounter (Signed)
Patient had labs done today, will have enough until results are back.

## 2019-01-30 ENCOUNTER — Encounter: Payer: Self-pay | Admitting: Family Medicine

## 2019-01-30 DIAGNOSIS — H4312 Vitreous hemorrhage, left eye: Secondary | ICD-10-CM | POA: Diagnosis not present

## 2019-01-30 DIAGNOSIS — Z9889 Other specified postprocedural states: Secondary | ICD-10-CM | POA: Diagnosis not present

## 2019-01-30 DIAGNOSIS — H2512 Age-related nuclear cataract, left eye: Secondary | ICD-10-CM | POA: Diagnosis not present

## 2019-01-30 DIAGNOSIS — H25811 Combined forms of age-related cataract, right eye: Secondary | ICD-10-CM | POA: Diagnosis not present

## 2019-01-30 LAB — BASIC METABOLIC PANEL
BUN: 14 mg/dL (ref 7–25)
CO2: 26 mmol/L (ref 20–32)
Calcium: 9.3 mg/dL (ref 8.6–10.4)
Chloride: 104 mmol/L (ref 98–110)
Creat: 0.59 mg/dL (ref 0.50–1.05)
Glucose, Bld: 156 mg/dL — ABNORMAL HIGH (ref 65–99)
Potassium: 4.6 mmol/L (ref 3.5–5.3)
Sodium: 139 mmol/L (ref 135–146)

## 2019-01-30 LAB — LIPID PANEL
Cholesterol: 99 mg/dL (ref <200)
HDL: 33 mg/dL — ABNORMAL LOW (ref >=50)
LDL Cholesterol (Calc): 39 mg/dL (calc)
Non-HDL Cholesterol (Calc): 66 mg/dL (calc) (ref <130)
Total CHOL/HDL Ratio: 3 (calc) (ref <5.0)
Triglycerides: 196 mg/dL — ABNORMAL HIGH (ref <150)

## 2019-01-30 LAB — TSH: TSH: 2.33 mIU/L

## 2019-01-30 LAB — CBC WITH DIFFERENTIAL/PLATELET
Absolute Monocytes: 644 cells/uL (ref 200–950)
Basophils Absolute: 113 cells/uL (ref 0–200)
Basophils Relative: 1 %
Eosinophils Absolute: 1062 cells/uL — ABNORMAL HIGH (ref 15–500)
Eosinophils Relative: 9.4 %
HCT: 36.2 % (ref 35.0–45.0)
Hemoglobin: 11.8 g/dL (ref 11.7–15.5)
Lymphs Abs: 2192 cells/uL (ref 850–3900)
MCH: 26 pg — ABNORMAL LOW (ref 27.0–33.0)
MCHC: 32.6 g/dL (ref 32.0–36.0)
MCV: 79.9 fL — ABNORMAL LOW (ref 80.0–100.0)
MPV: 9.9 fL (ref 7.5–12.5)
Monocytes Relative: 5.7 %
Neutro Abs: 7289 cells/uL (ref 1500–7800)
Neutrophils Relative %: 64.5 %
Platelets: 465 10*3/uL — ABNORMAL HIGH (ref 140–400)
RBC: 4.53 10*6/uL (ref 3.80–5.10)
RDW: 15.8 % — ABNORMAL HIGH (ref 11.0–15.0)
Total Lymphocyte: 19.4 %
WBC: 11.3 10*3/uL — ABNORMAL HIGH (ref 3.8–10.8)

## 2019-01-30 LAB — IRON,TIBC AND FERRITIN PANEL
%SAT: 14 % (calc) — ABNORMAL LOW (ref 16–45)
Ferritin: 9 ng/mL — ABNORMAL LOW (ref 16–232)
Iron: 65 ug/dL (ref 45–160)
TIBC: 472 mcg/dL (calc) — ABNORMAL HIGH (ref 250–450)

## 2019-01-30 LAB — HEMOGLOBIN A1C
Hgb A1c MFr Bld: 8.3 % of total Hgb — ABNORMAL HIGH (ref <5.7)
Mean Plasma Glucose: 192 (calc)
eAG (mmol/L): 10.6 (calc)

## 2019-01-30 LAB — VITAMIN B12: Vitamin B-12: 967 pg/mL (ref 200–1100)

## 2019-01-31 ENCOUNTER — Other Ambulatory Visit: Payer: Self-pay

## 2019-01-31 ENCOUNTER — Other Ambulatory Visit: Payer: Self-pay | Admitting: Family Medicine

## 2019-01-31 DIAGNOSIS — E611 Iron deficiency: Secondary | ICD-10-CM

## 2019-01-31 MED ORDER — LIRAGLUTIDE 18 MG/3ML ~~LOC~~ SOPN: 1.2000 mg | PEN_INJECTOR | Freq: Every day | SUBCUTANEOUS | 3 refills | Status: DC

## 2019-02-03 NOTE — Progress Notes (Addendum)
[facility] Note  02/05/2019     CHIEF COMPLAINT Patient presents for Retina Follow Up   HISTORY OF PRESENT ILLNESS: Gloria Gomez is a 48 y.o. female who presents to the clinic today for:   HPI    Retina Follow Up    Patient presents with  Diabetic Retinopathy.  In both eyes.  This started months ago.  Severity is moderate.  Duration of 9 days.  Since onset it is stable.  I, the attending physician,  performed the HPI with the patient and updated documentation appropriately.          Comments    48 y/o female pt here for 9 day f/u for PDR w/mac edema OU and VH OS.  Feels VA is a little better.  No change in [place] OD.  Scheduled for cat sx OD w/Dr. Katy Fitch 12.11.20.  Denies pain, flashes, floaters.  Pred QD OD.  BS 118 this a.m.  A1C 8.2 1 wk ago.       Last edited by Bernarda Caffey, MD on 02/05/2019  5:45 PM. (History)    pt states she is scheduled for cataract sx with Dr. Wyatt Portela this coming Friday   Referring physician:   HISTORICAL INFORMATION:   Selected notes from the MEDICAL RECORD NUMBER Referred for DM exam LEE:  Ocular Hx-NPDR PMH-DM (takes invokana and metformin), high cholesterol, benign brain tumor, white coat HTN    CURRENT MEDICATIONS: Current Outpatient Medications (Ophthalmic Drugs)  Medication Sig  . atropine 1 % ophthalmic solution Place 1 drop into the right eye 2 (two) times daily.  . prednisoLONE acetate (PRED FORTE) 1 % ophthalmic suspension Place 1 drop into the right eye 2 (two) times daily. (Patient taking differently: Place 1 drop into the right eye daily. )   No current facility-administered medications for this visit.  (Ophthalmic Drugs)   Current Outpatient Medications (Other)  Medication Sig  . APPLE CIDER VINEGAR PO Take 2 tablets by mouth daily.  . canagliflozin (INVOKANA) 300 MG TABS tablet Take 1 tablet (300 mg total) by mouth daily before breakfast.  . glipiZIDE (GLUCOTROL XL) 10 MG 24 hr tablet Take 1  tablet (10 mg total) by mouth daily with breakfast.  . HYDROcodone-acetaminophen (NORCO/VICODIN) 5-325 MG tablet Take 1-2 tablets by mouth every 6 (six) hours as needed for moderate pain. (Patient taking differently: Take 1 tablet by mouth every 12 (twelve) hours as needed for moderate pain. )  . liraglutide (VICTOZA) 18 MG/3ML SOPN Inject 0.2 mLs (1.2 mg total) into the skin daily. Inject 0.11m SQ daily for 1 week, then increase to 1.271mSQ daily.  . Marland Kitchenosartan (COZAAR) 100 MG tablet Take 1 tablet by mouth once daily  . metoprolol succinate (TOPROL-XL) 100 MG 24 hr tablet TAKE 1 TABLET BY MOUTH ONCE DAILY TAKE  WITH  OR  IMMEDIATELY  FOLLOWING  A  MEAL  . Multiple Vitamin (MULTIVITAMIN WITH MINERALS) TABS tablet Take 1 tablet by mouth daily.  . Marland KitchenVER THE COUNTER MEDICATION Take 2 tablets by mouth daily. Neu Remedy  . pantoprazole (PROTONIX) 40 MG tablet TAKE 1 TABLET BY MOUTH ONCE DAILY . APPOINTMENT REQUIRED FOR FUTURE REFILLS  . promethazine (PHENERGAN) 12.5 MG tablet 1-2 tabs po q6h prn nausea  . rosuvastatin (CRESTOR) 10 MG tablet Take 1 tablet (10 mg total) by mouth daily.  . sitaGLIPtin-metformin (JANUMET) 50-1000 MG tablet Take 1 tablet by mouth 2 (two) times daily with a meal.  . temazepam (RESTORIL) 15  MG capsule Take 1 capsule (15 mg total) by mouth at bedtime as needed for sleep.   No current facility-administered medications for this visit.  (Other)      REVIEW OF SYSTEMS: ROS    Positive for: Endocrine, Eyes   Negative for: Constitutional, Gastrointestinal, Neurological, Skin, Genitourinary, Musculoskeletal, HENT, Cardiovascular, Respiratory, Psychiatric, Allergic/Imm, Heme/Lymph   Last edited by Matthew Folks, COA on 02/05/2019  2:40 PM. (History)       ALLERGIES Allergies  Allergen Reactions  . Gluten Meal Swelling  . Lisinopril Cough  . Pioglitazone Other (See Comments)    ELEVATED glucoses + worse chronic nausea    PAST MEDICAL HISTORY Past Medical History:   Diagnosis Date  . Abdominal bloating    likely from diab gastroparesis.  Dr. Havery Moros to do EGD as of 10/2017 GI eval.  . Benign brain tumor ([place])    Cystic lesion in cerebral aqueduct region with mild hydrocephalus-- stable MRI 02/2016.  Surveillance MRI 05/2017 --dilated cerebral aqueduct related to aqueductal stenosis and subsequent mild hydrocephalus (due to the 11 mm stable cystic lesion in cerebral aqueduct---?congenitial?.  . Cataract    OU  . Dysmenorrhea    vicodin occ during first 2 days of cycle.  . Gluten intolerance    pt reports she underwent full GI w/u to r/o celiac dz  . Hepatic steatosis    ultrasound 08/2017  . Hyperlipidemia, mixed   . Hypertensive retinopathy    OU  . Hypertensive retinopathy of both eyes   . Insomnia   . Iron deficiency 01/2019   hemoccults ordered  . Proliferative diabetic retinopathy of both eyes ([place])    steroid injections 10/2017--improved  . Sensorineural hearing loss of left ear    Sudden left hearing loss summer 2016--no improvement with steroids 01/2015 so brain MRI done by Dr. Redmond Baseman and it showed brain tumor that was determined to be benign.  Pt's hearing not bad enough for hearing aid as of 06/2016.  . Type 2 diabetes with complication ([place])    diab retpthy, diabetic gastroparesis (gastric emptying study mildly abnl 03/2017).  Recommended lantus 08/2018 but pt declined.  . White coat hypertension    Past Surgical History:  Procedure Laterality Date  . CHOLECYSTECTOMY  2000  . GAS INSERTION Right 10/31/2018   Procedure: Insertion Of C3F8 Gas;  Surgeon: Bernarda Caffey, MD;  Location: [facility];  Service: Ophthalmology;  Laterality: Right;  . GASTRIC EMPTYING SCAN  04/20/2017   Mildly abnormal, particularly the 1st hour of emptying.  Marland Kitchen MEMBRANE PEEL Right 10/31/2018   Procedure: MEMBRANE PEEL;  Surgeon: Bernarda Caffey, MD;  Location: [facility];  Service: Ophthalmology;  Laterality: Right;  . PARS PLANA VITRECTOMY Right 04/12/2018   Procedure: Right  PARS PLANA VITRECTOMY WITH 25 GAUGE with intravitreal antibiotics;  Surgeon: Bernarda Caffey, MD;  Location: [facility];  Service: Ophthalmology;  Laterality: Right;  . PARS PLANA VITRECTOMY Right 10/31/2018   Procedure: PARS PLANA VITRECTOMY WITH 25 GAUGE;  Surgeon: Bernarda Caffey, MD;  Location: [facility];  Service: Ophthalmology;  Laterality: Right;  . PHOTOCOAGULATION WITH LASER Right 10/31/2018   Procedure: Photocoagulation With Laser;  Surgeon: Bernarda Caffey, MD;  Location: [facility];  Service: Ophthalmology;  Laterality: Right;    FAMILY HISTORY Family History  Problem Relation Age of Onset  . Brain cancer Mother   . Diabetes Father   . Diabetes Maternal Grandmother   . Cataracts Maternal Grandmother   . Cervical cancer Paternal Grandmother   . Colon cancer  Maternal Grandfather 64  . Amblyopia Neg Hx   . Blindness Neg Hx   . Glaucoma Neg Hx   . Macular degeneration Neg Hx   . Retinal detachment Neg Hx   . Strabismus Neg Hx   . Retinitis pigmentosa Neg Hx     SOCIAL HISTORY Social History   Tobacco Use  . Smoking status: Never Smoker  . Smokeless tobacco: Never Used  Substance Use Topics  . Alcohol use: No  . Drug use: No         OPHTHALMIC EXAM:  Base Eye Exam    Visual Acuity (Snellen - Linear)      Right Left   Dist cc 20/100 - 20/25 -2   Dist ph cc 20/40 NI   Correction: Glasses       Tonometry (Tonopen, 2:44 PM)      Right Left   Pressure 17 18       Pupils      Dark Light Shape React APD   Right 5 4 Round Brisk None   Left 5 4 Round Brisk None       Visual Fields (Counting fingers)      Left Right    Full Full       Extraocular Movement      Right Left    Full, Ortho Full, Ortho       Neuro/Psych    Oriented x3: Yes   Mood/Affect: Normal       Dilation    Both eyes: 1.0% Mydriacyl, 2.5% Phenylephrine @ 2:44 PM        Slit Lamp and Fundus Exam    Slit Lamp Exam      Right Left   Lids/Lashes Dermatochalasis - upper lid, Meibomian gland  dysfunction, Telangiectasia Dermatochalasis - upper lid, Meibomian gland dysfunction, Telangiectasia   Conjunctiva/Sclera White and quiet, sutures intact White and quiet   Cornea Clear, trace Punctate epithelial erosions 1+Punctate epithelial erosions   Anterior Chamber moderate depth, narrow temporal angle Deep and quiet   Iris Round and dilated, No NVI Round and dilated, No NVI   Lens 2-3+ Nuclear sclerosis with early brunescence, 2+ Cortical cataract, 2+PSC 2+ Nuclear sclerosis, 2+ Cortical cataract   Vitreous post vitrectomy Vitreous syneresis, mild residual blood stained vitreous centrally, clearing and settling inferiorly, blood clots inferiorly       Fundus Exam      Right Left   Disc Pink and Sharp, improved fibrosis  Pink and sharp   C/D Ratio 0.2 0.1   Macula Flat, central edema / cystic changes -- persistent, cluster of MA and exudates greatest centrally -- persistent Flat, good foveal reflex, scattered MA and exudate, mild cystic changes, trace ERM nasally    Vessels mild Vascular attenuation, Tortuous, stable improvement in fibrosis along superior vessels Vascular attenuation   Periphery Attached, tractional fibrosis in SN quadrant removed, scattered IRH, 360 PRP, good laser fill in 360 attached, 360 PRP scars room for posterior fill in          IMAGING AND PROCEDURES  Imaging and Procedures for _0 @  OCT, Retina - OU - Both Eyes       Right Eye Quality was good. Central Foveal Thickness: 335. Progression has been stable. Findings include abnormal foveal contour, epiretinal membrane, no SRF, intraretinal hyper-reflective material, vitreous traction, preretinal fibrosis (Persistent IRF).   Left Eye Quality was good. Central Foveal Thickness: 292. Progression has been stable. Findings include normal foveal contour, intraretinal fluid, no SRF (Mild Interval  improvement in IRF; Mild ERM).   Notes *Images captured and stored on drive  Diagnosis / Impression:  DME  OU OD: persistent IRF OS: interval improvement in IRF / hemorrhage; Mild ERM  Clinical management:  See below  Abbreviations: NFP - Normal foveal profile. CME - cystoid macular edema. PED - pigment epithelial detachment. IRF - intraretinal fluid. SRF - subretinal fluid. EZ - ellipsoid zone. ERM - epiretinal membrane. ORA - outer retinal atrophy. ORT - outer retinal tubulation. SRHM - subretinal hyper-reflective material         Intravitreal Injection, Pharmacologic Agent - OD - Right Eye       Time Out 02/05/2019. 2:12 PM. Confirmed correct patient, procedure, site, and patient consented.   Anesthesia Topical anesthesia was used. Anesthetic medications included Lidocaine 2%, Proparacaine 0.5%.   Procedure Preparation included 5% betadine to ocular surface, eyelid speculum. A supplied needle was used.   Injection:  1.25 mg Bevacizumab (AVASTIN) SOLN   NDC: 25956-387-56, Lot: [id number] , Expiration date: 04/05/2019   Route: Intravitreal, Site: Right Eye, Waste: 0 mL  Post-op Post injection exam found visual acuity of at least counting fingers. The patient tolerated the procedure well. There were no complications. The patient received written and verbal post procedure care education.        Intravitreal Injection, Pharmacologic Agent - OS - Left Eye       Time Out 02/05/2019. 2:14 PM. Confirmed correct patient, procedure, site, and patient consented.   Anesthesia Topical anesthesia was used. Anesthetic medications included Lidocaine 2%, Proparacaine 0.5%.   Procedure Preparation included 5% betadine to ocular surface, eyelid speculum. A 30 gauge needle was used.   Injection:  1.25 mg Bevacizumab (AVASTIN) SOLN   NDC: 43329-518-84, Lot: [id number] , Expiration date: 04/16/2019   Route: Intravitreal, Site: Left Eye, Waste: 0 mL  Post-op Post injection exam found visual acuity of at least counting fingers. The patient tolerated the procedure well. There were no  complications. The patient received written and verbal post procedure care education.                ASSESSMENT/PLAN:    ICD-10-CM   1. Proliferative diabetic retinopathy of both eyes with macular edema associated with type 2 diabetes mellitus (HCC)  Z66.0630 Intravitreal Injection, Pharmacologic Agent - OD - Right Eye    Intravitreal Injection, Pharmacologic Agent - OS - Left Eye    Bevacizumab (AVASTIN) SOLN 1.25 mg    Bevacizumab (AVASTIN) SOLN 1.25 mg  2. Retinal edema  H35.81 OCT, Retina - OU - Both Eyes  3. Vitreous hemorrhage of left eye (HCC)  H43.12 Intravitreal Injection, Pharmacologic Agent - OS - Left Eye    Bevacizumab (AVASTIN) SOLN 1.25 mg  4. Right endophthalmia  H44.001   5. Vitreous hemorrhage, right eye ([place])  H43.11   6. Essential hypertension  I10   7. Hypertensive retinopathy of both eyes  H35.033   8. Combined forms of age-related cataract of both eyes  H25.813     1,2.  Proliferative diabetic retinopathy w/ DME, OU  - HbA1c 8.3% (12.2.20), 8.6% (06.30.20), 8.4% (02.14.20)  - s/p IVA OD #1 9.20.19, #2 (10.25.19), #3 (11.15.19), #4 (12.17.19), #5 (01.14.20), #6 (2.11.20), #7 (05.29.20), #8 (08.19.20), #9 (10.30.20)  - s/p IVA OS #1 9.27.19, #2 (10.25.19), #3 (11.15.19), #4 (12.17.19), #5 (01.14.20), #6 (2.11.20), #7 (04.26.20), #8 (05.29.20), #9 (06.26.20), #10 (08.05.20), #11 (11.11.20)  - S/P PRP OS (09.20.19), (5.19.20), (08.19.20)  - S/P PRP OD (9.27.19 and 11.21.19), fill-in (04.14.20) (09.03.20, surgery)  -  FA (9.20.19) shows +NVE OU and leaking MA and capillary nonperfusion  - repeat FA 11.15.19 shows NV regressing OU  - pre-op: OD w/ VA stable at 20/25, but there is some preretinal fibrosis / tractional membranes just superior to disc and mild central DME  - now s/p 25g PPV+MP+10% C3F8 gas OD (09.03.20) -- ERM/PRF removal OD             - doing well  - BCVA 20/40 -- limited by PSC             - fibrosis/ERM improved; retina attached             -  IOP okay at 17             - cont  PF 1x/day until next appt -- okay to stop                         PSO ung QID OD -- use at night / PRN  - OCT shows persistent DME OU + residual VH OS  - recommend IVA OU (OD #10 and OS #12) today, 12.09.20  - pt wishes to proceed with IVA OU  - RBA of procedure discussed, questions answered  - informed consent obtained and signed  - see procedure note  - f/u 4 weeks -- DFE/OCT/possible injection OU  3. Vitreous hemorrhage OS -- improving  - recurrent VH, onset 11.11.20  - initial onset 4.24.20  - etiology: secondary to PDR as described above (no RT/RD on exam)  - s/p PRP OS (9.20.19), (05.19.20), (08.19.20)  - s/p IVA OS on 4.26.20, 5.29.20, 6.26.20, 8.5.20, and 11.11.20  - today, vast improvement in diffuse VH post-IVA OS on 11.11.20 -- pt reports improvement in floaters  - BCVA back to 20/25  - recommend IVA OS #12 as above  - may benefit from further PRP fill in OS for VH prophylaxis  - f/u 4 weeks -- DFE/OCT/possible injection OU  4. History of Endophthalmitis OD  - s/p IVA OU 04/09/2018  - s/p 25g PPV w/ intravitreal vanc, ceftaz and cefepime OD, 2.14.2020  - s/p intravitreal tap / vanc and ceflaz injections (02.16.20)             - gram stain (2.14.20) shows G+ cocci, WBCs mostly PMNs;   - repeat gram stain from t/i (2.16.20) -- no organisms, just WBCs             - cultures from vitreous grew rare Staph warneri; cultures from t/i -- no growth             - doing well, BCVA 20/30             - inflammation/posterior debris resolved             - IOP 17 off Brimonidine  - completed po pred taper -- caused significant elevations in BG  - monitor  5. History of Vitreous Hemorrhage OD -- cleared from PPV x2 for endophthalmitis and ERM/preretinal fibrosis  - secondary to PDR as described below  - S/P IVA OD #1 (09.20.19), #2 (10.25.19), #3 (11.15.19), #4 (12.16.19), #5 (03/12/2018)  - S/P PRP OD #1 (09.27.19), fill in OD (11.21.19) -- each  somewhat limited inferiorly by residual VH  6,7. Hypertensive retinopathy OU  - discussed importance of tight BP control.  - monitor for now  8. Combined form age related cataract OU-   - The symptoms of cataract, surgical options,  and treatments and risks were discussed with patient.  - discussed diagnosis and progression -- in particular likely progression of cataract OD following PPV  - PSC OD becoming visually significant  - pt scheduled for surgery with Dr. Zenia Resides on 12.11.20  - will clear for cataract eval once VH OS clears   Ophthalmic Meds Ordered this visit:  Meds ordered this encounter  Medications  . Bevacizumab (AVASTIN) SOLN 1.25 mg  . Bevacizumab (AVASTIN) SOLN 1.25 mg       Return in about 4 weeks (around 03/05/2019) for f/u PDR OU, DFE, OCT.  There are no Patient Instructions on file for this visit.   Explained the diagnoses, plan, and follow up with the patient and they expressed understanding.  Patient expressed understanding of the importance of proper follow up care.   This document serves as a record of services personally performed by Gardiner Sleeper, MD, PhD. It was created on their behalf by Roselee Nova, COMT. The creation of this record is the provider's dictation and/or activities during the visit.  Electronically signed by: Roselee Nova, COMT 02/05/19 6:03 PM  Gardiner Sleeper, M.D., Ph.D. Diseases & Surgery of the Retina and [place] 02/05/2019   I have reviewed the above documentation for accuracy and completeness, and I agree with the above. Gardiner Sleeper, M.D., Ph.D. 02/05/19 6:03 PM   Abbreviations: M myopia (nearsighted); A astigmatism; H hyperopia (farsighted); P presbyopia; Mrx spectacle prescription;  CTL contact lenses; OD right eye; OS left eye; OU both eyes  XT exotropia; ET esotropia; PEK punctate epithelial keratitis; PEE punctate epithelial erosions; DES dry eye syndrome; MGD meibomian gland  dysfunction; ATs artificial tears; PFAT's preservative free artificial tears; [place] nuclear sclerotic cataract; PSC posterior subcapsular cataract; ERM epi-retinal membrane; PVD posterior vitreous detachment; RD retinal detachment; DM diabetes mellitus; DR diabetic retinopathy; NPDR non-proliferative diabetic retinopathy; PDR proliferative diabetic retinopathy; CSME clinically significant macular edema; DME diabetic macular edema; dbh dot blot hemorrhages; CWS cotton wool spot; POAG primary open angle glaucoma; C/D cup-to-disc ratio; HVF humphrey visual field; GVF goldmann visual field; OCT optical coherence tomography; IOP intraocular pressure; BRVO Branch retinal vein occlusion; CRVO central retinal vein occlusion; CRAO central retinal artery occlusion; BRAO branch retinal artery occlusion; RT retinal tear; SB scleral buckle; PPV pars plana vitrectomy; VH Vitreous hemorrhage; PRP panretinal laser photocoagulation; IVK intravitreal kenalog; VMT vitreomacular traction; MH Macular hole;  NVD neovascularization of the disc; NVE neovascularization elsewhere; AREDS age related eye disease study; ARMD age related macular degeneration; POAG primary open angle glaucoma; EBMD epithelial/anterior basement membrane dystrophy; ACIOL anterior chamber intraocular lens; IOL intraocular lens; PCIOL posterior chamber intraocular lens; Phaco/IOL phacoemulsification with intraocular lens placement; [place] photorefractive keratectomy; LASIK laser assisted in situ keratomileusis; HTN hypertension; DM diabetes mellitus; COPD chronic obstructive pulmonary disease

## 2019-02-05 ENCOUNTER — Other Ambulatory Visit: Payer: Self-pay

## 2019-02-05 ENCOUNTER — Encounter (INDEPENDENT_AMBULATORY_CARE_PROVIDER_SITE_OTHER): Payer: Self-pay | Admitting: Ophthalmology

## 2019-02-05 ENCOUNTER — Ambulatory Visit (INDEPENDENT_AMBULATORY_CARE_PROVIDER_SITE_OTHER): Payer: BLUE CROSS/BLUE SHIELD | Admitting: Ophthalmology

## 2019-02-05 DIAGNOSIS — H44001 Unspecified purulent endophthalmitis, right eye: Secondary | ICD-10-CM | POA: Diagnosis not present

## 2019-02-05 DIAGNOSIS — E113513 Type 2 diabetes mellitus with proliferative diabetic retinopathy with macular edema, bilateral: Secondary | ICD-10-CM

## 2019-02-05 DIAGNOSIS — H4311 Vitreous hemorrhage, right eye: Secondary | ICD-10-CM

## 2019-02-05 DIAGNOSIS — H25813 Combined forms of age-related cataract, bilateral: Secondary | ICD-10-CM

## 2019-02-05 DIAGNOSIS — H35033 Hypertensive retinopathy, bilateral: Secondary | ICD-10-CM

## 2019-02-05 DIAGNOSIS — H3581 Retinal edema: Secondary | ICD-10-CM

## 2019-02-05 DIAGNOSIS — I10 Essential (primary) hypertension: Secondary | ICD-10-CM

## 2019-02-05 DIAGNOSIS — H4312 Vitreous hemorrhage, left eye: Secondary | ICD-10-CM

## 2019-02-05 MED ORDER — BEVACIZUMAB CHEMO INJECTION 1.25MG/0.05ML SYRINGE FOR KALEIDOSCOPE
1.2500 mg | INTRAVITREAL | Status: AC | PRN
  Administered 2019-02-05: 1.25 mg via INTRAVITREAL

## 2019-02-07 DIAGNOSIS — H25811 Combined forms of age-related cataract, right eye: Secondary | ICD-10-CM | POA: Diagnosis not present

## 2019-02-14 ENCOUNTER — Other Ambulatory Visit: Payer: Self-pay | Admitting: Family Medicine

## 2019-02-14 MED ORDER — METOPROLOL SUCCINATE ER 100 MG PO TB24: ORAL_TABLET | ORAL | 0 refills | Status: DC

## 2019-02-14 MED ORDER — PANTOPRAZOLE SODIUM 40 MG PO TBEC: DELAYED_RELEASE_TABLET | ORAL | 0 refills | Status: DC

## 2019-02-14 NOTE — Telephone Encounter (Signed)
Pt has met her OOP for the year and is requesting a 90 DS of Janumet, metoprolol and protonix be sent to [facility] in [place].  

## 2019-02-14 NOTE — Telephone Encounter (Signed)
Rx's sent. Okay per protocol.

## 2019-02-24 ENCOUNTER — Telehealth: Payer: Self-pay | Admitting: Family Medicine

## 2019-02-24 MED ORDER — PROMETHAZINE HCL 12.5 MG PO TABS: ORAL_TABLET | ORAL | 1 refills | Status: DC

## 2019-02-24 MED ORDER — HYDROCODONE-ACETAMINOPHEN 5-325 MG PO TABS: 1.0000 | ORAL_TABLET | Freq: Four times a day (QID) | ORAL | 0 refills | Status: DC | PRN

## 2019-02-24 MED ORDER — LIRAGLUTIDE 18 MG/3ML ~~LOC~~ SOPN: 1.2000 mg | PEN_INJECTOR | Freq: Every day | SUBCUTANEOUS | 3 refills | Status: DC

## 2019-02-24 NOTE — Telephone Encounter (Signed)
OK done

## 2019-02-24 NOTE — Telephone Encounter (Signed)
Left detailed message. Okay per DPR 

## 2019-02-24 NOTE — Telephone Encounter (Signed)
Patient has met her OOP for the year.  She is requesting 90 day supply on Victoza.  Starting in January she will have to pay $909 a month for her Victoza.  So she is requesting 3 month supply to give her time to inquire about receiving help with copay assistance with her diabetic needs.  liraglutide (VICTOZA) 18 MG/3ML SOPN [[id number] 90 d/s   promethazine (PHENERGAN) 12.5 MG tablet [[id number]]  30 d/s  HYDROcodone-acetaminophen (NORCO/VICODIN) 5-325 MG tablet [[id number]]  30 d/s   [place], [place]

## 2019-02-24 NOTE — Telephone Encounter (Signed)
RF request for Victoza 90 d/s LOV:01/06/19 Next ov: advised to f/u 3 mo. Last written: 01/31/19 (5 pen,3)  RF request for Phenergan 30 d.s LOV:01/06/19 Next ov: advised to f/u 3 mo. Last written: 03/15/17 (30,1)   Requesting: Hydrocodone 30 d/s Contract: n/a UDS: n/a Last Visit:01/06/19 Next Visit: advised to f/u 3 mo. Last Refill: 04/03/18 (40,0)  Please Advise

## 2019-03-07 ENCOUNTER — Other Ambulatory Visit: Payer: Self-pay

## 2019-03-07 ENCOUNTER — Encounter (INDEPENDENT_AMBULATORY_CARE_PROVIDER_SITE_OTHER): Payer: BLUE CROSS/BLUE SHIELD | Admitting: Ophthalmology

## 2019-03-07 MED ORDER — ROSUVASTATIN CALCIUM 10 MG PO TABS: 10.0000 mg | ORAL_TABLET | Freq: Every day | ORAL | 4 refills | Status: DC

## 2019-03-10 ENCOUNTER — Other Ambulatory Visit: Payer: Self-pay

## 2019-03-10 ENCOUNTER — Encounter (INDEPENDENT_AMBULATORY_CARE_PROVIDER_SITE_OTHER): Payer: Self-pay | Admitting: Ophthalmology

## 2019-03-10 ENCOUNTER — Ambulatory Visit (INDEPENDENT_AMBULATORY_CARE_PROVIDER_SITE_OTHER): Payer: BC Managed Care – PPO | Admitting: Ophthalmology

## 2019-03-10 DIAGNOSIS — H3581 Retinal edema: Secondary | ICD-10-CM | POA: Diagnosis not present

## 2019-03-10 DIAGNOSIS — H44001 Unspecified purulent endophthalmitis, right eye: Secondary | ICD-10-CM

## 2019-03-10 DIAGNOSIS — H25812 Combined forms of age-related cataract, left eye: Secondary | ICD-10-CM

## 2019-03-10 DIAGNOSIS — E113513 Type 2 diabetes mellitus with proliferative diabetic retinopathy with macular edema, bilateral: Secondary | ICD-10-CM | POA: Diagnosis not present

## 2019-03-10 DIAGNOSIS — H35033 Hypertensive retinopathy, bilateral: Secondary | ICD-10-CM

## 2019-03-10 DIAGNOSIS — Z961 Presence of intraocular lens: Secondary | ICD-10-CM

## 2019-03-10 DIAGNOSIS — H4311 Vitreous hemorrhage, right eye: Secondary | ICD-10-CM

## 2019-03-10 DIAGNOSIS — I10 Essential (primary) hypertension: Secondary | ICD-10-CM

## 2019-03-10 DIAGNOSIS — H4312 Vitreous hemorrhage, left eye: Secondary | ICD-10-CM

## 2019-03-10 MED ORDER — BEVACIZUMAB CHEMO INJECTION 1.25MG/0.05ML SYRINGE FOR KALEIDOSCOPE
1.2500 mg | INTRAVITREAL | Status: AC | PRN
  Administered 2019-03-10: 12:00:00 1.25 mg via INTRAVITREAL

## 2019-03-10 MED ORDER — BEVACIZUMAB CHEMO INJECTION 1.25MG/0.05ML SYRINGE FOR KALEIDOSCOPE
1.2500 mg | INTRAVITREAL | Status: AC | PRN
  Administered 2019-03-10: 1.25 mg via INTRAVITREAL

## 2019-03-10 NOTE — Progress Notes (Signed)
[facility] Note  03/10/2019     CHIEF COMPLAINT Patient presents for Retina Follow Up   HISTORY OF PRESENT ILLNESS: Gloria Gomez is a 49 y.o. female who presents to the clinic today for:   HPI    Retina Follow Up    Patient presents with  Diabetic Retinopathy.  In both eyes.  Severity is moderate.  Duration of 4.5 weeks.  Since onset it is stable.  I, the attending physician,  performed the HPI with the patient and updated documentation appropriately.          Comments    Patient states vision improved OD. Had CE with IOL OD about 3 weeks ago with Dr. Wyatt Portela. Using Pred Forte once daily OD. Vision the same OS. BS was 105 this am. Last a1c was 8.1.        Last edited by Bernarda Caffey, MD on 03/10/2019 11:02 AM. (History)    pt had cataract sx with Dr. Zenia Resides in December, she states she is very happy with the results, she is using PF once a day in her right eye only   Referring physician:   HISTORICAL INFORMATION:   Selected notes from the MEDICAL RECORD NUMBER Referred for DM exam LEE:  Ocular Hx-NPDR PMH-DM (takes invokana and metformin), high cholesterol, benign brain tumor, white coat HTN    CURRENT MEDICATIONS: Current Outpatient Medications (Ophthalmic Drugs)  Medication Sig  . prednisoLONE acetate (PRED FORTE) 1 % ophthalmic suspension Place 1 drop into the right eye 2 (two) times daily. (Patient taking differently: Place 1 drop into the right eye daily. )  . atropine 1 % ophthalmic solution Place 1 drop into the right eye 2 (two) times daily.   No current facility-administered medications for this visit. (Ophthalmic Drugs)   Current Outpatient Medications (Other)  Medication Sig  . APPLE CIDER VINEGAR PO Take 2 tablets by mouth daily.  . canagliflozin (INVOKANA) 300 MG TABS tablet Take 1 tablet (300 mg total) by mouth daily before breakfast.  . glipiZIDE (GLUCOTROL XL) 10 MG 24 hr tablet Take 1 tablet (10 mg total) by  mouth daily with breakfast.  . HYDROcodone-acetaminophen (NORCO/VICODIN) 5-325 MG tablet Take 1-2 tablets by mouth every 6 (six) hours as needed for moderate pain.  Marland Kitchen JANUMET 50-1000 MG tablet TAKE 1 TABLET BY MOUTH TWICE DAILY WITH A MEAL  . liraglutide (VICTOZA) 18 MG/3ML SOPN Inject 0.2 mLs (1.2 mg total) into the skin daily. Inject 0.6mg  SQ daily for 1 week, then increase to 1.2mg  SQ daily.  Marland Kitchen losartan (COZAAR) 100 MG tablet Take 1 tablet by mouth once daily  . metoprolol succinate (TOPROL-XL) 100 MG 24 hr tablet TAKE 1 TABLET BY MOUTH ONCE DAILY TAKE  WITH  OR  IMMEDIATELY  FOLLOWING  A  MEAL  . Multiple Vitamin (MULTIVITAMIN WITH MINERALS) TABS tablet Take 1 tablet by mouth daily.  Marland Kitchen OVER THE COUNTER MEDICATION Take 2 tablets by mouth daily. Neu Remedy  . pantoprazole (PROTONIX) 40 MG tablet TAKE 1 TABLET BY MOUTH ONCE DAILY .  Marland Kitchen promethazine (PHENERGAN) 12.5 MG tablet 1-2 tabs po q6h prn nausea  . rosuvastatin (CRESTOR) 10 MG tablet Take 1 tablet (10 mg total) by mouth daily.  . temazepam (RESTORIL) 15 MG capsule Take 1 capsule (15 mg total) by mouth at bedtime as needed for sleep.   No current facility-administered medications for this visit. (Other)      REVIEW OF SYSTEMS: ROS  Positive for: Endocrine, Eyes   Negative for: Constitutional, Gastrointestinal, Neurological, Skin, Genitourinary, Musculoskeletal, HENT, Cardiovascular, Respiratory, Psychiatric, Allergic/Imm, Heme/Lymph   Last edited by Roselee Nova D, COT on 03/10/2019 10:07 AM. (History)       ALLERGIES Allergies  Allergen Reactions  . Gluten Meal Swelling  . Lisinopril Cough  . Pioglitazone Other (See Comments)    ELEVATED glucoses + worse chronic nausea    PAST MEDICAL HISTORY Past Medical History:  Diagnosis Date  . Abdominal bloating    likely from diab gastroparesis.  Dr. Havery Moros to do EGD as of 10/2017 GI eval.  . Benign brain tumor ([place])    Cystic lesion in cerebral aqueduct region with mild  hydrocephalus-- stable MRI 02/2016.  Surveillance MRI 05/2017 --dilated cerebral aqueduct related to aqueductal stenosis and subsequent mild hydrocephalus (due to the 11 mm stable cystic lesion in cerebral aqueduct---?congenitial?.  . Cataract    OU  . Dysmenorrhea    vicodin occ during first 2 days of cycle.  . Gluten intolerance    pt reports she underwent full GI w/u to r/o celiac dz  . Hepatic steatosis    ultrasound 08/2017  . Hyperlipidemia, mixed   . Hypertensive retinopathy    OU  . Hypertensive retinopathy of both eyes   . Insomnia   . Iron deficiency 01/2019   hemoccults ordered  . Proliferative diabetic retinopathy of both eyes ([place])    steroid injections 10/2017--improved  . Sensorineural hearing loss of left ear    Sudden left hearing loss summer 2016--no improvement with steroids 01/2015 so brain MRI done by Dr. Redmond Baseman and it showed brain tumor that was determined to be benign.  Pt's hearing not bad enough for hearing aid as of 06/2016.  . Type 2 diabetes with complication ([place])    diab retpthy, diabetic gastroparesis (gastric emptying study mildly abnl 03/2017).  Recommended lantus 08/2018 but pt declined.  . White coat hypertension    Past Surgical History:  Procedure Laterality Date  . CHOLECYSTECTOMY  2000  . GAS INSERTION Right 10/31/2018   Procedure: Insertion Of C3F8 Gas;  Surgeon: Bernarda Caffey, MD;  Location: [place];  Service: Ophthalmology;  Laterality: Right;  . GASTRIC EMPTYING SCAN  04/20/2017   Mildly abnormal, particularly the 1st hour of emptying.  Marland Kitchen MEMBRANE PEEL Right 10/31/2018   Procedure: MEMBRANE PEEL;  Surgeon: Bernarda Caffey, MD;  Location: [place];  Service: Ophthalmology;  Laterality: Right;  . PARS PLANA VITRECTOMY Right 04/12/2018   Procedure: Right PARS PLANA VITRECTOMY WITH 25 GAUGE with intravitreal antibiotics;  Surgeon: Bernarda Caffey, MD;  Location: [place];  Service: Ophthalmology;  Laterality: Right;  . PARS PLANA VITRECTOMY Right 10/31/2018    Procedure: PARS PLANA VITRECTOMY WITH 25 GAUGE;  Surgeon: Bernarda Caffey, MD;  Location: [place];  Service: Ophthalmology;  Laterality: Right;  . PHOTOCOAGULATION WITH LASER Right 10/31/2018   Procedure: Photocoagulation With Laser;  Surgeon: Bernarda Caffey, MD;  Location: [place];  Service: Ophthalmology;  Laterality: Right;    FAMILY HISTORY Family History  Problem Relation Age of Onset  . Brain cancer Mother   . Diabetes Father   . Diabetes Maternal Grandmother   . Cataracts Maternal Grandmother   . Cervical cancer Paternal Grandmother   . Colon cancer Maternal Grandfather 84  . Amblyopia Neg Hx   . Blindness Neg Hx   . Glaucoma Neg Hx   . Macular degeneration Neg Hx   . Retinal detachment Neg Hx   . Strabismus Neg Hx   .  Retinitis pigmentosa Neg Hx     SOCIAL HISTORY Social History   Tobacco Use  . Smoking status: Never Smoker  . Smokeless tobacco: Never Used  Substance Use Topics  . Alcohol use: No  . Drug use: No         OPHTHALMIC EXAM:  Base Eye Exam    Visual Acuity (Snellen - Linear)      Right Left   Dist [place] 20/20 -1    Dist cc  20/20 -2       Tonometry (Tonopen, 10:05 AM)      Right Left   Pressure 18 19       Pupils      Dark Light Shape React APD   Right 5 4 Round Brisk None   Left 5 4 Round Brisk None       Visual Fields (Counting fingers)      Left Right    Full Full       Extraocular Movement      Right Left    Full, Ortho Full, Ortho       Neuro/Psych    Oriented x3: Yes   Mood/Affect: Normal       Dilation    Both eyes: 1.0% Mydriacyl, 2.5% Phenylephrine @ 10:05 AM        Slit Lamp and Fundus Exam    Slit Lamp Exam      Right Left   Lids/Lashes Dermatochalasis - upper lid, mild Meibomian gland dysfunction, Telangiectasia Dermatochalasis - upper lid, Meibomian gland dysfunction, Telangiectasia   Conjunctiva/Sclera White and quiet, sutures intact White and quiet   Cornea Clear, trace Punctate epithelial erosions, well healed  temporal cataract wounds 1+Punctate epithelial erosions   Anterior Chamber Deep and quiet Deep and quiet   Iris Round and dilated, No NVI Round and dilated, No NVI   Lens PC IOL in good position 2+ Nuclear sclerosis, 2+ Cortical cataract   Vitreous post vitrectomy, trace pigment Vitreous syneresis, VH clearing centrally and settling inferiorly, blood clots inferiorly       Fundus Exam      Right Left   Disc Pink and Sharp, improved fibrosis  Pink and sharp   C/D Ratio 0.2 0.1   Macula Flat, central edema / cystic changes -- persistent, cluster of MA and exudates greatest ST to fovea -- persistent Flat, good foveal reflex, scattered MA and exudate, mild cystic changes, trace ERM nasally    Vessels mild Vascular attenuation, Tortuous, stable improvement in fibrosis along superior vessels Vascular attenuation, mild Tortuousity   Periphery Attached, tractional fibrosis in SN quadrant removed, scattered DBH, 360 PRP, good laser fill in 360 attached, 360 PRP scars room for posterior fill in          IMAGING AND PROCEDURES  Imaging and Procedures for @TODAY @  Intravitreal Injection, Pharmacologic Agent - OD - Right Eye       Time Out 03/10/2019. 10:07 AM. Confirmed correct patient, procedure, site, and patient consented.   Anesthesia Topical anesthesia was used. Anesthetic medications included Lidocaine 2%, Proparacaine 0.5%.   Procedure Preparation included 5% betadine to ocular surface, eyelid speculum. A 30 gauge needle was used.   Injection:  1.25 mg Bevacizumab (AVASTIN) SOLN   NDC: [id number], Lot: [id number] , Expiration date: 04/29/2019   Route: Intravitreal, Site: Right Eye, Waste: 0 mL  Post-op Post injection exam found visual acuity of at least counting fingers. The patient tolerated the procedure well. There were no complications. The patient received written and verbal  post procedure care education.        Intravitreal Injection, Pharmacologic Agent - OS - Left Eye        Time Out 03/10/2019. 10:08 AM. Confirmed correct patient, procedure, site, and patient consented.   Anesthesia Topical anesthesia was used. Anesthetic medications included Lidocaine 2%, Proparacaine 0.5%.   Procedure Preparation included 5% betadine to ocular surface, eyelid speculum. A 30 gauge needle was used.   Injection:  1.25 mg Bevacizumab (AVASTIN) SOLN   NDC: [id number], Lot: [id number] , Expiration date: 04/29/2019   Route: Intravitreal, Site: Left Eye, Waste: 0 mL  Post-op Post injection exam found visual acuity of at least counting fingers. The patient tolerated the procedure well. There were no complications. The patient received written and verbal post procedure care education.        OCT, Retina - OU - Both Eyes       Right Eye Quality was good. Central Foveal Thickness: 345. Progression has worsened. Findings include abnormal foveal contour, epiretinal membrane, no SRF, intraretinal hyper-reflective material, vitreous traction, preretinal fibrosis (Mild interval increase in IRF).   Left Eye Quality was good. Central Foveal Thickness: 288. Progression has been stable. Findings include normal foveal contour, intraretinal fluid, no SRF (persistent cystic changes/IRF; Mild ERM, mild persistent vitreous opacities ).   Notes *Images captured and stored on drive  Diagnosis / Impression:  DME OU OD: persistent IRF OS: persistent cystic changes/IRF; Mild ERM, mild persistent vitreous opacities   Clinical management:  See below  Abbreviations: NFP - Normal foveal profile. CME - cystoid macular edema. PED - pigment epithelial detachment. IRF - intraretinal fluid. SRF - subretinal fluid. EZ - ellipsoid zone. ERM - epiretinal membrane. ORA - outer retinal atrophy. ORT - outer retinal tubulation. SRHM - subretinal hyper-reflective material                 ASSESSMENT/PLAN:    ICD-10-CM   1. Proliferative diabetic retinopathy of both eyes with macular  edema associated with type 2 diabetes mellitus (HCC)  16/03/2019 Intravitreal Injection, Pharmacologic Agent - OD - Right Eye    Intravitreal Injection, Pharmacologic Agent - OS - Left Eye    Bevacizumab (AVASTIN) SOLN 1.25 mg    Bevacizumab (AVASTIN) SOLN 1.25 mg  2. Retinal edema  H35.81 OCT, Retina - OU - Both Eyes  3. Vitreous hemorrhage of left eye (HCC)  H43.12 Intravitreal Injection, Pharmacologic Agent - OS - Left Eye    Bevacizumab (AVASTIN) SOLN 1.25 mg  4. Right endophthalmia  H44.001   5. Vitreous hemorrhage, right eye ([place])  H43.11   6. Essential hypertension  I10   7. Hypertensive retinopathy of both eyes  H35.033   8. Combined forms of age-related cataract of left eye  H25.812   9. Pseudophakia of right eye  Z96.1     1,2.  Proliferative diabetic retinopathy w/ DME, OU  - HbA1c 8.3% (12.2.20), 8.6% (06.30.20), 8.4% (02.14.20)  - s/p IVA OD #1 9.20.19, #2 (10.25.19), #3 (11.15.19), #4 (12.17.19), #5 (01.14.20), #6 (2.11.20), #7 (05.29.20), #8 (08.19.20), #9 (10.30.20), #10 (12.09.20)  - s/p IVA OS #1 9.27.19, #2 (10.25.19), #3 (11.15.19), #4 (12.17.19), #5 (01.14.20), #6 (2.11.20), #7 (04.26.20), #8 (05.29.20), #9 (06.26.20), #10 (08.05.20), #11 (11.11.20), #12 (12.09.230)  - S/P PRP OS (09.20.19), (5.19.20), (08.19.20)  - S/P PRP OD (9.27.19 and 11.21.19), fill-in (04.14.20) (09.03.20, surgery)  - FA (9.20.19) shows +NVE OU and leaking MA and capillary nonperfusion  - repeat FA 11.15.19 shows NV regressing OU  - pre-op: OD  w/ VA stable at 20/25, but there is some preretinal fibrosis / tractional membranes just superior to disc and mild central DME  - s/p 25g PPV+MP+10% C3F8 gas OD (09.03.20) -- ERM/PRF removal OD  - BCVA improved to 20/20 from 20/40 following cataract surgery             - fibrosis/ERM stably improved; retina attached             - IOP okay at 18             - PSO ung at night / PRN  - OCT shows persistent DME OU + residual VH OS  - recommend IVA OU (OD #11  and OS #13) today, 01.11.21  - pt wishes to proceed with IVA OU  - RBA of procedure discussed, questions answered  - informed consent obtained and signed  - see procedure note  - f/u 4 weeks -- DFE/OCT/possible injection OU  3. Vitreous hemorrhage OS -- improving  - recurrent VH, onset 11.11.20  - initial onset 4.24.20  - etiology: secondary to PDR as described above (no RT/RD on exam)  - s/p PRP OS (9.20.19), (05.19.20), (08.19.20)  - s/p IVA OS on 4.26.20, 5.29.20, 6.26.20, 8.5.20, and 11.11.20, 12.06.20  - today, further improvement in diffuse VH post-IVA OS -- pt reports improvement in floaters  - BCVA back to 20/20  - recommend IVA OS #13 as above  - may benefit from further PRP fill in OS for VH prophylaxis  - f/u 4 weeks -- DFE/OCT/possible injection OU +/- PRP fill in OS  4. History of Endophthalmitis OD  - s/p IVA OU 04/09/2018  - s/p 25g PPV w/ intravitreal vanc, ceftaz and cefepime OD, 2.14.2020  - s/p intravitreal tap / vanc and ceflaz injections (02.16.20)             - gram stain (2.14.20) shows G+ cocci, WBCs mostly PMNs;   - repeat gram stain from t/i (2.16.20) -- no organisms, just WBCs             - cultures from vitreous grew rare Staph warneri; cultures from t/i -- no growth             - doing well, BCVA 20/30             - inflammation/posterior debris resolved             - IOP 18 off Brimonidine  - completed po pred taper -- caused significant elevations in BG  - monitor  5. History of Vitreous Hemorrhage OD -- cleared from PPV x2 for endophthalmitis and ERM/preretinal fibrosis  - secondary to PDR as described below  - S/P IVA OD #1 (09.20.19), #2 (10.25.19), #3 (11.15.19), #4 (12.16.19), #5 (03/12/2018)  - S/P PRP OD #1 (09.27.19), fill in OD (11.21.19) -- each somewhat limited inferiorly by residual VH  6,7. Hypertensive retinopathy OU  - discussed importance of tight BP control.  - monitor for now  8. Combined form age related cataract OS-   - The  symptoms of cataract, surgical options, and treatments and risks were discussed with patient.  - discussed diagnosis and progression -- in particular likely progression of cataract OD following PPV  - PSC OD becoming visually significant  - pt scheduled for surgery with Dr. Zenia Resides on 12.11.20  - will clear for cataract eval once VH OS clears  9. Pseudophakia OD  - s/p CE/IOL OD (Dr. Zenia Resides, 12.11.20)  - beautiful surgery,  doing well  - post op drops per Dr. Katy Fitch    Ophthalmic Meds Ordered this visit:  Meds ordered this encounter  Medications  . Bevacizumab (AVASTIN) SOLN 1.25 mg  . Bevacizumab (AVASTIN) SOLN 1.25 mg       Return in about 4 weeks (around 04/07/2019) for f/u PDR OU, DFE, OCT.  There are no Patient Instructions on file for this visit.   Explained the diagnoses, plan, and follow up with the patient and they expressed understanding.  Patient expressed understanding of the importance of proper follow up care.   This document serves as a record of services personally performed by Gardiner Sleeper, MD, PhD. It was created on their behalf by Ernest Mallick, OA, an ophthalmic assistant. The creation of this record is the provider's dictation and/or activities during the visit.    Electronically signed by: Ernest Mallick, OA 01.11.2021 11:53 AM  Gardiner Sleeper, M.D., Ph.D. Diseases & Surgery of the Retina and Vitreous [facility]  I have reviewed the above documentation for accuracy and completeness, and I agree with the above. Gardiner Sleeper, M.D., Ph.D. 03/10/19 11:53 AM    Abbreviations: M myopia (nearsighted); A astigmatism; H hyperopia (farsighted); P presbyopia; Mrx spectacle prescription;  CTL contact lenses; OD right eye; OS left eye; OU both eyes  XT exotropia; ET esotropia; PEK punctate epithelial keratitis; PEE punctate epithelial erosions; DES dry eye syndrome; MGD meibomian gland dysfunction; ATs artificial tears; PFAT's preservative  free artificial tears; [place] nuclear sclerotic cataract; PSC posterior subcapsular cataract; ERM epi-retinal membrane; PVD posterior vitreous detachment; RD retinal detachment; DM diabetes mellitus; DR diabetic retinopathy; NPDR non-proliferative diabetic retinopathy; PDR proliferative diabetic retinopathy; CSME clinically significant macular edema; DME diabetic macular edema; dbh dot blot hemorrhages; CWS cotton wool spot; POAG primary open angle glaucoma; C/D cup-to-disc ratio; HVF humphrey visual field; GVF goldmann visual field; OCT optical coherence tomography; IOP intraocular pressure; BRVO Branch retinal vein occlusion; CRVO central retinal vein occlusion; CRAO central retinal artery occlusion; BRAO branch retinal artery occlusion; RT retinal tear; SB scleral buckle; PPV pars plana vitrectomy; VH Vitreous hemorrhage; PRP panretinal laser photocoagulation; IVK intravitreal kenalog; VMT vitreomacular traction; MH Macular hole;  NVD neovascularization of the disc; NVE neovascularization elsewhere; AREDS age related eye disease study; ARMD age related macular degeneration; POAG primary open angle glaucoma; EBMD epithelial/anterior basement membrane dystrophy; ACIOL anterior chamber intraocular lens; IOL intraocular lens; PCIOL posterior chamber intraocular lens; Phaco/IOL phacoemulsification with intraocular lens placement; [employer] photorefractive keratectomy; LASIK laser assisted in situ keratomileusis; HTN hypertension; DM diabetes mellitus; COPD chronic obstructive pulmonary disease

## 2019-03-17 ENCOUNTER — Other Ambulatory Visit (INDEPENDENT_AMBULATORY_CARE_PROVIDER_SITE_OTHER): Payer: BC Managed Care – PPO

## 2019-03-17 ENCOUNTER — Encounter: Payer: Self-pay | Admitting: Gastroenterology

## 2019-03-17 ENCOUNTER — Ambulatory Visit (INDEPENDENT_AMBULATORY_CARE_PROVIDER_SITE_OTHER): Payer: BC Managed Care – PPO | Admitting: Gastroenterology

## 2019-03-17 VITALS — BP 126/80 | HR 100 | Temp 98.1°F | Ht 63.0 in | Wt 166.0 lb

## 2019-03-17 DIAGNOSIS — K3184 Gastroparesis: Secondary | ICD-10-CM | POA: Diagnosis not present

## 2019-03-17 DIAGNOSIS — K760 Fatty (change of) liver, not elsewhere classified: Secondary | ICD-10-CM | POA: Diagnosis not present

## 2019-03-17 DIAGNOSIS — K5909 Other constipation: Secondary | ICD-10-CM

## 2019-03-17 DIAGNOSIS — Z01818 Encounter for other preprocedural examination: Secondary | ICD-10-CM

## 2019-03-17 DIAGNOSIS — D509 Iron deficiency anemia, unspecified: Secondary | ICD-10-CM

## 2019-03-17 LAB — HEPATIC FUNCTION PANEL
ALT: 41 U/L — ABNORMAL HIGH (ref 0–35)
AST: 27 U/L (ref 0–37)
Albumin: 4.4 g/dL (ref 3.5–5.2)
Alkaline Phosphatase: 75 U/L (ref 39–117)
Bilirubin, Direct: 0.1 mg/dL (ref 0.0–0.3)
Total Bilirubin: 0.2 mg/dL (ref 0.2–1.2)
Total Protein: 7.7 g/dL (ref 6.0–8.3)

## 2019-03-17 MED ORDER — SUTAB 1479-225-188 MG PO TABS: 1.0000 | ORAL_TABLET | Freq: Once | ORAL | 0 refills | Status: AC

## 2019-03-17 NOTE — Progress Notes (Signed)
HPI :  49 year old female here for a follow-up visit.  She has a history of type 2 diabetes with suspected gastroparesis as evidenced by an abnormal gastric emptying study in February 2019.  At that time we had recommended a EGD to further evaluate her symptoms but something came up at the time and she had to cancel, she was not able to reschedule.  She has been referred back to [place] for recent diagnosis of iron deficiency anemia.  Labs 01/29/19 Iron 65 TIBC 472 Iron sat 14% Ferritin 9  Hgb 11.8, MCV 79.9  Hgb has ranged from 10.3 to 11.8 this year  She is still having menstrual periods.  In fact she states her menses are very heavy and have been irregular, associated with worsening pain.  She has seen gynecology in the past for this but not recently.  She denies any obvious blood in her stools.  She does have some baseline constipation that bothers her.  She has a bowel movement once every 3 days or so.  She takes a stool softener as needed.  She does have some early satiety and fullness and bloating after she eats in her upper abdomen on a routine basis.  She denies any nausea and vomiting.  No dysphagia.  She has been using Protonix for the past several months dosed at 40 mg a day, this has completely limited her symptoms of reflux.  She has never had a prior colonoscopy or endoscopy.  Her maternal grandmother had colon cancer, no other first-degree relatives with colon cancer.  Of note she has had a ultrasound in 2019 showing fatty liver.  Her LFTs historically have been normal.  Her last A1c was 8.3.  She denies any family history of cirrhosis or liver disease.  She does not drink any alcohol.  [place] 09/12/2017 - fatty liver, post chole  GES 04/19/2017 - delayed gastric emptying  Past Medical History:  Diagnosis Date  . Abdominal bloating    likely from diab gastroparesis.  Dr. Havery Moros to do EGD as of 10/2017 GI eval.  . Benign brain tumor ([place])    Cystic lesion in cerebral aqueduct  region with mild hydrocephalus-- stable MRI 02/2016.  Surveillance MRI 05/2017 --dilated cerebral aqueduct related to aqueductal stenosis and subsequent mild hydrocephalus (due to the 11 mm stable cystic lesion in cerebral aqueduct---?congenitial?.  . Cataract    OU  . Dysmenorrhea    vicodin occ during first 2 days of cycle.  . Gluten intolerance    pt reports she underwent full GI w/u to r/o celiac dz  . Hepatic steatosis    ultrasound 08/2017  . Hyperlipidemia, mixed   . Hypertensive retinopathy    OU  . Hypertensive retinopathy of both eyes   . Insomnia   . Iron deficiency 01/2019   hemoccults ordered  . Proliferative diabetic retinopathy of both eyes ([place])    steroid injections 10/2017--improved  . Sensorineural hearing loss of left ear    Sudden left hearing loss summer 2016--no improvement with steroids 01/2015 so brain MRI done by Dr. Redmond Baseman and it showed brain tumor that was determined to be benign.  Pt's hearing not bad enough for hearing aid as of 06/2016.  . Type 2 diabetes with complication ([place])    diab retpthy, diabetic gastroparesis (gastric emptying study mildly abnl 03/2017).  Recommended lantus 08/2018 but pt declined.  . White coat hypertension      Past Surgical History:  Procedure Laterality Date  . CHOLECYSTECTOMY  2000  .  GAS INSERTION Right 10/31/2018   Procedure: Insertion Of C3F8 Gas;  Surgeon: Bernarda Caffey, MD;  Location: [facility];  Service: Ophthalmology;  Laterality: Right;  . GASTRIC EMPTYING SCAN  04/20/2017   Mildly abnormal, particularly the 1st hour of emptying.  Marland Kitchen MEMBRANE PEEL Right 10/31/2018   Procedure: MEMBRANE PEEL;  Surgeon: Bernarda Caffey, MD;  Location: [facility];  Service: Ophthalmology;  Laterality: Right;  . PARS PLANA VITRECTOMY Right 04/12/2018   Procedure: Right PARS PLANA VITRECTOMY WITH 25 GAUGE with intravitreal antibiotics;  Surgeon: Bernarda Caffey, MD;  Location: [place];  Service: Ophthalmology;  Laterality: Right;  . PARS PLANA VITRECTOMY Right  10/31/2018   Procedure: PARS PLANA VITRECTOMY WITH 25 GAUGE;  Surgeon: Bernarda Caffey, MD;  Location: [facility];  Service: Ophthalmology;  Laterality: Right;  . PHOTOCOAGULATION WITH LASER Right 10/31/2018   Procedure: Photocoagulation With Laser;  Surgeon: Bernarda Caffey, MD;  Location: [place];  Service: Ophthalmology;  Laterality: Right;   Family History  Problem Relation Age of Onset  . Brain cancer Mother   . Diabetes Father   . Diabetes Maternal Grandmother   . Cataracts Maternal Grandmother   . Cervical cancer Paternal Grandmother   . Colon cancer Maternal Grandfather 13  . Amblyopia Neg Hx   . Blindness Neg Hx   . Glaucoma Neg Hx   . Macular degeneration Neg Hx   . Retinal detachment Neg Hx   . Strabismus Neg Hx   . Retinitis pigmentosa Neg Hx    Social History   Tobacco Use  . Smoking status: Never Smoker  . Smokeless tobacco: Never Used  Substance Use Topics  . Alcohol use: No  . Drug use: No   Current Outpatient Medications  Medication Sig Dispense Refill  . canagliflozin (INVOKANA) 300 MG TABS tablet Take 1 tablet (300 mg total) by mouth daily before breakfast. 90 tablet 0  . glipiZIDE (GLUCOTROL XL) 10 MG 24 hr tablet Take 1 tablet (10 mg total) by mouth daily with breakfast. 90 tablet 1  . HYDROcodone-acetaminophen (NORCO/VICODIN) 5-325 MG tablet Take 1-2 tablets by mouth every 6 (six) hours as needed for moderate pain. 40 tablet 0  . JANUMET 50-1000 MG tablet TAKE 1 TABLET BY MOUTH TWICE DAILY WITH A MEAL 180 tablet 0  . liraglutide (VICTOZA) 18 MG/3ML SOPN Inject 0.2 mLs (1.2 mg total) into the skin daily. Inject 0.6mg  SQ daily for 1 week, then increase to 1.2mg  SQ daily. 15 pen 3  . losartan (COZAAR) 100 MG tablet Take 1 tablet by mouth once daily 90 tablet 0  . metoprolol succinate (TOPROL-XL) 100 MG 24 hr tablet TAKE 1 TABLET BY MOUTH ONCE DAILY TAKE  WITH  OR  IMMEDIATELY  FOLLOWING  A  MEAL 90 tablet 0  . Multiple Vitamin (MULTIVITAMIN WITH MINERALS) TABS tablet Take 1  tablet by mouth daily.    Marland Kitchen OVER THE COUNTER MEDICATION Take 2 tablets by mouth daily. Neu Remedy    . pantoprazole (PROTONIX) 40 MG tablet TAKE 1 TABLET BY MOUTH ONCE DAILY . 30 tablet 0  . promethazine (PHENERGAN) 12.5 MG tablet 1-2 tabs po q6h prn nausea 30 tablet 1  . rosuvastatin (CRESTOR) 10 MG tablet Take 1 tablet (10 mg total) by mouth daily. 30 tablet 4   No current facility-administered medications for this visit.   Allergies  Allergen Reactions  . Gluten Meal Swelling  . Lisinopril Cough  . Pioglitazone Other (See Comments)    ELEVATED glucoses + worse chronic nausea  Review of Systems: All systems reviewed and negative except where noted in HPI.    Lab Results  Component Value Date   ALT 41 (H) 03/17/2019   AST 27 03/17/2019   ALKPHOS 75 03/17/2019   BILITOT 0.2 03/17/2019    Lab Results  Component Value Date   CREATININE 0.59 01/29/2019   BUN 14 01/29/2019   NA 139 01/29/2019   K 4.6 01/29/2019   CL 104 01/29/2019   CO2 26 01/29/2019    Lab Results  Component Value Date   WBC 11.3 (H) 01/29/2019   HGB 11.8 01/29/2019   HCT 36.2 01/29/2019   MCV 79.9 (L) 01/29/2019   PLT 465 (H) 01/29/2019   Lab Results  Component Value Date   IRON 65 01/29/2019   TIBC 472 (H) 01/29/2019   FERRITIN 9 (L) 01/29/2019    CBC Latest Ref Rng & Units 01/29/2019 10/31/2018 04/12/2018  WBC 3.8 - 10.8 Thousand/uL 11.3(H) 12.1(H) 12.0(H)  Hemoglobin 11.7 - 15.5 g/dL 11.8 10.3(L) 11.8(L)  Hematocrit 35.0 - 45.0 % 36.2 33.4(L) 38.8  Platelets 140 - 400 Thousand/uL 465(H) 445(H) 508(H)     Physical Exam: BP 126/80   Pulse 100   Temp 98.1 F (36.7 C)   Ht 5\' 3"  (1.6 m)   Wt 166 lb (75.3 kg)   BMI 29.41 kg/m  Constitutional: Pleasant,well-developed, female in no acute distress. HEENT: Normocephalic and atraumatic. Conjunctivae are normal. No scleral icterus. Neck supple.  Cardiovascular: Normal rate, regular rhythm.  Pulmonary/chest: Effort normal and breath  sounds normal. No wheezing, rales or rhonchi. Abdominal: Soft, nondistended, nontender.  There are no masses palpable.  Extremities: no edema Lymphadenopathy: No cervical adenopathy noted. Neurological: Alert and oriented to person place and time. Skin: Skin is warm and dry. No rashes noted. Psychiatric: Normal mood and affect. Behavior is normal.   ASSESSMENT AND PLAN: 49 year old female here for reassessment the following:  Iron deficiency anemia - we discussed differential diagnosis for her iron deficiency anemia.  It is quite possible this is due to menstrual blood loss and her regular and heavy menses.  That being said given her age, she has had no prior colon cancer screening, she also has an abnormal gastric emptying study, EGD and colonoscopy are recommended to further evaluate.  I discussed with these exams are, risks and benefits of the exams and anesthesia, she wanted to proceed.  If there is no clear cause for her anemia on these exams, she should follow-up with her gynecologist to discuss her abnormal menstrual cycles as this would be the likely cause in this scenario.  She agreed with the plan, I recommend MiraLAX daily until her colonoscopy to ensure her bowel prep is adequate.  Gastroparesis - she has evidence of gastroparesis based on prior imaging and her symptoms in the setting of diabetes.  We discussed what this is.  Counseled her on a gastroparesis diet and gave her some information about this.  Pending EGD is normal, will discuss if she wants to try some Reglan.  Fatty liver - discussed fatty liver with her, she does not drink alcohol, likely part of metabolic syndrome.  I asked her to go to the lab to check LFTs today.  Her ALT is just above the normal limit at 41.  She should do her best to get diabetes controlled and exercises routinely.  We will trend her liver function testing and will repeat in 1 year.  Her body mass index is less than 30.  Chronic constipation -  as  above recommend MiraLAX daily and titrate up as needed to produce bowel movement every day to every other day, particularly prior to her colonoscopy.  I spent 35  minutes of time, including in depth chart review, independent review of results as outlined above, communicating results with the patient directly, face-to-face time with the patient, coordinating care, and ordering studies and medications as appropriate, and documenting this encounter.   [place] Cellar, MD [facility]

## 2019-03-17 NOTE — Patient Instructions (Addendum)
If you are age 51 or older, your body mass index should be between 23-30. Your Body mass index is 29.41 kg/m. If this is out of the aforementioned range listed, please consider follow up with your Primary Care Provider.  If you are age 82 or younger, your body mass index should be between 19-25. Your Body mass index is 29.41 kg/m. If this is out of the aformentioned range listed, please consider follow up with your Primary Care Provider.   You have been scheduled for an endoscopy and colonoscopy. Please follow the written instructions given to you at your visit today. Please pick up your prep supplies at the pharmacy within the next 1-3 days. If you use inhalers (even only as needed), please bring them with you on the day of your procedure.   Please go to the lab in the basement of our building to have lab work done as you leave today. Hit "B" for basement when you get on the elevator.  When the doors open the lab is on your left.  We will call you with the results. Thank you.   Please use Miralax once to twice daily until your procedure.  We are giving you a handout regarding a gastroparesis diet today.  Thank you for entrusting me with your care and for choosing [facility], Dr. Tillman Cellar

## 2019-03-18 ENCOUNTER — Other Ambulatory Visit: Payer: BC Managed Care – PPO

## 2019-03-18 DIAGNOSIS — E611 Iron deficiency: Secondary | ICD-10-CM

## 2019-03-18 LAB — HEMOCCULT SLIDES (X 3 CARDS)
Fecal Occult Blood: NEGATIVE
OCCULT 1: NEGATIVE
OCCULT 2: NEGATIVE
OCCULT 3: NEGATIVE
OCCULT 4: NEGATIVE
OCCULT 5: NEGATIVE

## 2019-03-19 ENCOUNTER — Encounter: Payer: Self-pay | Admitting: Family Medicine

## 2019-03-19 ENCOUNTER — Ambulatory Visit (INDEPENDENT_AMBULATORY_CARE_PROVIDER_SITE_OTHER): Payer: BC Managed Care – PPO

## 2019-03-19 DIAGNOSIS — Z1159 Encounter for screening for other viral diseases: Secondary | ICD-10-CM | POA: Diagnosis not present

## 2019-03-20 ENCOUNTER — Telehealth: Payer: Self-pay

## 2019-03-20 LAB — SARS CORONAVIRUS 2 (TAT 6-24 HRS): SARS Coronavirus 2: POSITIVE — AB

## 2019-03-20 NOTE — Telephone Encounter (Signed)
Thanks Gloria Gomez, really sorry to see this. Can you please call her to let her know (if you can't get ahold of her would also send a Mychart message and call again later). Her procedure needs to be cancelled for tomorrow, she should not bowel prep, okay to eat regular food, and she needs to take precautions and isolate, should contact her PCP for advice if feeling poorly.  I think we should plan on rescheduling her to be done in 4 weeks or so otherwise after she has recovered / cleared this. Can you please keep this on your radar for future scheduling or she can pick a date now to reschedule if she would like.

## 2019-03-20 NOTE — Telephone Encounter (Signed)
Left message on patient's voice mail to please call us back as soon as possible. ( I am trying to notify her of positive COVID test, see how she is feeling,  cancel Endo/Colon for tomorrow, and to contact her PCP)

## 2019-03-20 NOTE — Telephone Encounter (Signed)
Called and spoke to husband, let him know patient is COVID +. He said she is not having any symptoms. Asked to have her Not do the prep, return to a normal diet. That her EGD/Colon will be cancelled for tomorrow and we will plan to reschedule in about 4 weeks. Asked to have her quarantine and call her PCP if she develops  any symptoms

## 2019-03-20 NOTE — Telephone Encounter (Signed)
[facility] called and patient's COVID if positive

## 2019-03-21 ENCOUNTER — Encounter: Payer: Self-pay | Admitting: Gastroenterology

## 2019-03-21 ENCOUNTER — Telehealth: Payer: Self-pay | Admitting: Nurse Practitioner

## 2019-03-21 NOTE — Telephone Encounter (Signed)
Called to Discuss with patient about Covid symptoms and the use of bamlanivimab, a monoclonal antibody infusion for those with mild to moderate Covid symptoms and at a high risk of hospitalization.     Pt is qualified for this infusion at the [facility] infusion center due to co-morbid conditions and/or a member of an at-risk group.     Patient Active Problem List   Diagnosis Date Noted  . Breast cancer screening 02/12/2015  . Cervical cancer screening 02/12/2015  . Encounter for routine gynecological examination 02/12/2015  . Vaginal candidiasis 02/12/2015  . Uncontrolled diabetes mellitus type 2 without complications 0000000    Patient states that she is not having any symptoms at this time.  at this time. Symptoms tier reviewed as well as criteria for ending isolation. Preventative practices reviewed. Patient verbalized understanding.    Patient advised to call back if she does develop symptoms decides that she does want to get infusion. Callback number to the infusion center given. Patient advised to go to Urgent care or ED with severe symptoms.

## 2019-03-27 ENCOUNTER — Encounter: Payer: Self-pay | Admitting: Family Medicine

## 2019-03-27 ENCOUNTER — Other Ambulatory Visit: Payer: Self-pay | Admitting: Family Medicine

## 2019-04-01 NOTE — Progress Notes (Signed)
[facility] Note  04/14/2019     CHIEF COMPLAINT Patient presents for Retina Follow Up   HISTORY OF PRESENT ILLNESS: Gloria Gomez is a 49 y.o. female who presents to the clinic today for:   HPI    Retina Follow Up    Patient presents with  Diabetic Retinopathy.  In both eyes.  This started weeks ago.  Severity is moderate.  Duration of weeks.  Since onset it is stable.  I, the attending physician,  performed the HPI with the patient and updated documentation appropriately.          Comments    Pt states vision is about the same.  She denies eye pain or discomfort and denies any new or worsening floaters or fol OU.       Last edited by Bernarda Caffey, MD on 04/14/2019  4:05 PM. (History)    pt states she was dx with covid a few weeks ago, she states she is feeling better but is tired today, she states her blood sugar has been pretty good, she states she has been on an injection medication for about 30 days and it seems to be helping keep her BS under control, she states she has had a blurry spot in her vision since her last injection  Referring physician:   HISTORICAL INFORMATION:   Selected notes from the MEDICAL RECORD NUMBER Referred for DM exam LEE:  Ocular Hx-NPDR PMH-DM (takes invokana and metformin), high cholesterol, benign brain tumor, white coat HTN    CURRENT MEDICATIONS: No current outpatient medications on file. (Ophthalmic Drugs)   No current facility-administered medications for this visit. (Ophthalmic Drugs)   Current Outpatient Medications (Other)  Medication Sig  . canagliflozin (INVOKANA) 300 MG TABS tablet Take 1 tablet (300 mg total) by mouth daily before breakfast.  . glipiZIDE (GLUCOTROL XL) 10 MG 24 hr tablet Take 1 tablet (10 mg total) by mouth daily with breakfast.  . HYDROcodone-acetaminophen (NORCO/VICODIN) 5-325 MG tablet Take 1-2 tablets by mouth every 6 (six) hours as needed for moderate pain.  Marland Kitchen JANUMET  50-1000 MG tablet TAKE 1 TABLET BY MOUTH TWICE DAILY WITH A MEAL  . liraglutide (VICTOZA) 18 MG/3ML SOPN Inject 0.2 mLs (1.2 mg total) into the skin daily. Inject 0.6mg  SQ daily for 1 week, then increase to 1.2mg  SQ daily.  Marland Kitchen losartan (COZAAR) 100 MG tablet Take 1 tablet by mouth once daily  . metoCLOPramide (REGLAN) 5 MG tablet Take 1 tablet (5 mg total) by mouth 3 (three) times daily with meals as needed for nausea.  . metoprolol succinate (TOPROL-XL) 100 MG 24 hr tablet TAKE 1 TABLET BY MOUTH ONCE DAILY TAKE  WITH  OR  IMMEDIATELY  FOLLOWING  A  MEAL  . Multiple Vitamin (MULTIVITAMIN WITH MINERALS) TABS tablet Take 1 tablet by mouth daily.  Marland Kitchen OVER THE COUNTER MEDICATION Take 2 tablets by mouth daily. Neu Remedy  . pantoprazole (PROTONIX) 40 MG tablet Take 1 tablet by mouth once daily  . promethazine (PHENERGAN) 12.5 MG tablet 1-2 tabs po q6h prn nausea  . rosuvastatin (CRESTOR) 10 MG tablet Take 1 tablet (10 mg total) by mouth daily. (Patient taking differently: Take 10 mg by mouth every other day. )   No current facility-administered medications for this visit. (Other)      REVIEW OF SYSTEMS: ROS    Positive for: Endocrine, Eyes   Negative for: Constitutional, Gastrointestinal, Neurological, Skin, Genitourinary, Musculoskeletal, HENT, Cardiovascular, Respiratory, Psychiatric, Allergic/Imm, Heme/Lymph  Last edited by [name] on 04/14/2019  2:42 PM. (History)       ALLERGIES Allergies  Allergen Reactions  . Gluten Meal Swelling  . Lisinopril Cough  . Pioglitazone Other (See Comments)    ELEVATED glucoses + worse chronic nausea    PAST MEDICAL HISTORY Past Medical History:  Diagnosis Date  . Abdominal bloating    likely from diab gastroparesis.  Dr. Havery Moros to do EGD as of 10/2017 GI eval.  . Benign brain tumor ([place])    Cystic lesion in cerebral aqueduct region with mild hydrocephalus-- stable MRI 02/2016.  Surveillance MRI 05/2017 --dilated cerebral aqueduct  related to aqueductal stenosis and subsequent mild hydrocephalus (due to the 11 mm stable cystic lesion in cerebral aqueduct---?congenitial?.  . Cataract    OU  . Diabetic retinopathy ([place])   . Dysmenorrhea    vicodin occ during first 2 days of cycle.  . Gluten intolerance    pt reports she underwent full GI w/u to r/o celiac dz  . Hepatic steatosis    ultrasound 08/2017  . Hyperlipidemia, mixed   . Hypertensive retinopathy    OU  . Hypertensive retinopathy of both eyes   . Insomnia   . Iron deficiency 01/2019   Hb 11.3. Hemoccults neg x 3 03/19/19. GI to do EGD/colon.  Pt does have menorrhagia, though, so she'll see her GYN.  . Proliferative diabetic retinopathy of both eyes ([place])    steroid injections 10/2017--improved  . Sensorineural hearing loss of left ear    Sudden left hearing loss summer 2016--no improvement with steroids 01/2015 so brain MRI done by Dr. Redmond Baseman and it showed brain tumor that was determined to be benign.  Pt's hearing not bad enough for hearing aid as of 06/2016.  . Type 2 diabetes with complication ([place])    diab retpthy, diabetic gastroparesis (gastric emptying study mildly abnl 03/2017).  Recommended lantus 08/2018 but pt declined.  . White coat hypertension    Past Surgical History:  Procedure Laterality Date  . CHOLECYSTECTOMY  2000  . GAS INSERTION Right 10/31/2018   Procedure: Insertion Of C3F8 Gas;  Surgeon: Bernarda Caffey, MD;  Location: [facility];  Service: Ophthalmology;  Laterality: Right;  . GASTRIC EMPTYING SCAN  04/20/2017   Mildly abnormal, particularly the 1st hour of emptying.  Marland Kitchen MEMBRANE PEEL Right 10/31/2018   Procedure: MEMBRANE PEEL;  Surgeon: Bernarda Caffey, MD;  Location: [facility];  Service: Ophthalmology;  Laterality: Right;  . PARS PLANA VITRECTOMY Right 04/12/2018   Procedure: Right PARS PLANA VITRECTOMY WITH 25 GAUGE with intravitreal antibiotics;  Surgeon: Bernarda Caffey, MD;  Location: [place];  Service: Ophthalmology;  Laterality: Right;  . PARS  PLANA VITRECTOMY Right 10/31/2018   Procedure: PARS PLANA VITRECTOMY WITH 25 GAUGE;  Surgeon: Bernarda Caffey, MD;  Location: [facility];  Service: Ophthalmology;  Laterality: Right;  . PHOTOCOAGULATION WITH LASER Right 10/31/2018   Procedure: Photocoagulation With Laser;  Surgeon: Bernarda Caffey, MD;  Location: [employer];  Service: Ophthalmology;  Laterality: Right;    FAMILY HISTORY Family History  Problem Relation Age of Onset  . Brain cancer Mother   . Diabetes Father   . Diabetes Maternal Grandmother   . Cataracts Maternal Grandmother   . Cervical cancer Paternal Grandmother   . Colon cancer Maternal Grandfather 49  . Amblyopia Neg Hx   . Blindness Neg Hx   . Glaucoma Neg Hx   . Macular degeneration Neg Hx   . Retinal detachment Neg Hx   .  Strabismus Neg Hx   . Retinitis pigmentosa Neg Hx   . Esophageal cancer Neg Hx   . Stomach cancer Neg Hx   . Rectal cancer Neg Hx     SOCIAL HISTORY Social History   Tobacco Use  . Smoking status: Never Smoker  . Smokeless tobacco: Never Used  Substance Use Topics  . Alcohol use: No  . Drug use: No         OPHTHALMIC EXAM:  Base Eye Exam    Visual Acuity (Snellen - Linear)      Right Left   Dist [place] 20/25 -2    Dist cc  20/25 -2   Dist ph [place] NI    Dist ph cc  20/20 -2   Correction: Glasses       Tonometry (Tonopen, 2:47 PM)      Right Left   Pressure 20 20       Pupils      Dark Light Shape React APD   Right 3 2 Round Minimal 0   Left 3 2 Round Minimal 0       Visual Fields      Left Right    Full Full       Extraocular Movement      Right Left    Full Full       Neuro/Psych    Oriented x3: Yes   Mood/Affect: Normal       Dilation    Both eyes: 1.0% Mydriacyl, 2.5% Phenylephrine @ 2:47 PM        Slit Lamp and Fundus Exam    Slit Lamp Exam      Right Left   Lids/Lashes Dermatochalasis - upper lid, mild Meibomian gland dysfunction, Telangiectasia Dermatochalasis - upper lid, Meibomian gland dysfunction,  Telangiectasia   Conjunctiva/Sclera White and quiet, sutures intact White and quiet   Cornea Clear, trace Punctate epithelial erosions, well healed temporal cataract wounds 1+Punctate epithelial erosions   Anterior Chamber Deep and quiet Deep and quiet   Iris Round and dilated, No NVI Round and dilated, No NVI   Lens PC IOL in good position, 1+ Posterior capsular opacification, PC folds 2+ Nuclear sclerosis, 2+ Cortical cataract   Vitreous post vitrectomy, trace pigment Vitreous syneresis, +RBC in anterior vitroeus, diffuse VH clearing, VH settling inferiorly       Fundus Exam      Right Left   Disc Pink and Sharp, improved fibrosis  Hazy view, Pink and sharp   C/D Ratio 0.2 0.1   Macula Flat, blunted foveal reflex, scattered MA/exudate, cluster of MA and exudates greatest ST to fovea -- persistent Flat, blunted foveal reflex, scattered MA, scattered cystic changes, scattered punctate exudates, trace ERM nasally    Vessels mild Vascular attenuation, Tortuous, stable improvement in fibrosis along superior vessels Vascular attenuation, mild Tortuousity   Periphery Attached, tractional fibrosis in SN quadrant removed, scattered DBH greatest superiorly, 360 PRP, good laser fill in 360 attached, 360 PRP scars room for posterior fill in, inferior retina obscured by settling VH        Refraction    Wearing Rx      Sphere Cylinder   Right -3.75 Sphere   Left -3.75 Sphere          IMAGING AND PROCEDURES  Imaging and Procedures for @TODAY @  OCT, Retina - OU - Both Eyes       Right Eye Quality was good. Central Foveal Thickness: 405. Progression has worsened. Findings include abnormal foveal  contour, epiretinal membrane, no SRF, intraretinal hyper-reflective material, vitreous traction, preretinal fibrosis (interval increase in IRF).   Left Eye Quality was good. Central Foveal Thickness: 291. Progression has been stable. Findings include normal foveal contour, intraretinal fluid, no SRF  (Mild interval increase in IRF; Mild ERM, mild persistent vitreous opacities ).   Notes *Images captured and stored on drive  Diagnosis / Impression:  DME OU OD: interval increase in IRF OS: Mild interval increase in IRF; Mild ERM, mild persistent vitreous opacities    Clinical management:  See below  Abbreviations: NFP - Normal foveal profile. CME - cystoid macular edema. PED - pigment epithelial detachment. IRF - intraretinal fluid. SRF - subretinal fluid. EZ - ellipsoid zone. ERM - epiretinal membrane. ORA - outer retinal atrophy. ORT - outer retinal tubulation. SRHM - subretinal hyper-reflective material         Intravitreal Injection, Pharmacologic Agent - OD - Right Eye       Time Out 04/14/2019. 3:42 PM. Confirmed correct patient, procedure, site, and patient consented.   Anesthesia Topical anesthesia was used. Anesthetic medications included Lidocaine 2%, Proparacaine 0.5%.   Procedure Preparation included 5% betadine to ocular surface. A supplied needle was used.   Injection:  1.25 mg Bevacizumab (AVASTIN) SOLN   NDC: 70360-001-02, Lot: [id number] , Expiration date: 05/14/2019   Route: Intravitreal, Site: Right Eye, Waste: 0 mg  Post-op Post injection exam found visual acuity of at least counting fingers. The patient tolerated the procedure well. There were no complications. The patient received written and verbal post procedure care education.        Intravitreal Injection, Pharmacologic Agent - OS - Left Eye       Time Out 04/14/2019. 3:43 PM. Confirmed correct patient, procedure, site, and patient consented.   Anesthesia Topical anesthesia was used. Anesthetic medications included Lidocaine 2%, Proparacaine 0.5%.   Procedure Preparation included 5% betadine to ocular surface. A supplied needle was used.   Injection:  1.25 mg Bevacizumab (AVASTIN) SOLN   NDC: 70360-001-02, Lot: [id number] , Expiration date: 05/14/2019   Route: Intravitreal, Site: Left  Eye, Waste: 0 mg  Post-op Post injection exam found visual acuity of at least counting fingers. The patient tolerated the procedure well. There were no complications. The patient received written and verbal post procedure care education.                ASSESSMENT/PLAN:    ICD-10-CM   1. Proliferative diabetic retinopathy of both eyes with macular edema associated with type 2 diabetes mellitus (HCC)  05/27/2019 Intravitreal Injection, Pharmacologic Agent - OD - Right Eye    Intravitreal Injection, Pharmacologic Agent - OS - Left Eye    Bevacizumab (AVASTIN) SOLN 1.25 mg    Bevacizumab (AVASTIN) SOLN 1.25 mg  2. Vitreous hemorrhage of left eye (HCC)  H43.12 Intravitreal Injection, Pharmacologic Agent - OS - Left Eye    Bevacizumab (AVASTIN) SOLN 1.25 mg  3. Retinal edema  H35.81 OCT, Retina - OU - Both Eyes  4. Right endophthalmia  H44.001   5. Vitreous hemorrhage, right eye ([place])  H43.11   6. Essential hypertension  I10   7. Hypertensive retinopathy of both eyes  H35.033   8. Combined forms of age-related cataract of left eye  H25.812   9. Pseudophakia of right eye  Z96.1     1,2.  Proliferative diabetic retinopathy w/ DME, OU  - HbA1c 8.3% (12.2.20), 8.6% (06.30.20), 8.4% (02.14.20)  - s/p IVA OD #1 9.20.19, #2 (10.25.19), #3 (11.15.19), #4 (  12.17.19), #5 (01.14.20), #6 (2.11.20), #7 (05.29.20), #8 (08.19.20), #9 (10.30.20), #10 (12.09.20), #11 (01.11.21)  - s/p IVA OS #1 9.27.19, #2 (10.25.19), #3 (11.15.19), #4 (12.17.19), #5 (01.14.20), #6 (2.11.20), #7 (04.26.20), #8 (05.29.20), #9 (06.26.20), #10 (08.05.20), #11 (11.11.20), #12 (12.09.20), #13 (01.11.21)  - S/P PRP OS (09.20.19), (5.19.20), (08.19.20)  - S/P PRP OD (9.27.19 and 11.21.19), fill-in (04.14.20) (09.03.20, surgery)  - FA (9.20.19) shows +NVE OU and leaking MA and capillary nonperfusion  - repeat FA 11.15.19 shows NV regressing OU  - pre-op: OD w/ VA stable at 20/25, but there is some preretinal fibrosis /  tractional membranes just superior to disc and mild central DME  - s/p 25g PPV+MP+10% C3F8 gas OD (09.03.20) -- ERM/PRF removal OD  - BCVA improved to 20/20 from 20/40 following cataract surgery -- vision today is 20/25             - fibrosis/ERM stably improved; retina attached  - OCT shows interval increase in IRF OD; persistent cystic changes/IRF OS  - recommend IVA OU (OD #12 and OS #14) today, 02.15.21  - pt wishes to proceed with IVA OU  - RBA of procedure discussed, questions answered  - informed consent obtained   - see procedure note  - Avastin informed consent form signed and scanned on 01.11.2021 (OU)  - f/u 4 weeks -- DFE/OCT/possible injection OU  3. Vitreous hemorrhage OS -- improving  - recurrent VH, onset 11.11.20  - initial onset 4.24.20  - etiology: secondary to PDR as described above (no RT/RD on exam)  - s/p PRP OS (9.20.19), (05.19.20), (08.19.20)  - s/p IVA OS on 4.26.20, 5.29.20, 6.26.20, 8.5.20, and 11.11.20, 12.06.20  - today, further improvement in diffuse VH post-IVA OS -- pt reports improvement in floaters  - BCVA back to 20/20  - recommend IVA OS #14 as above  - may benefit from further PRP fill in OS for VH prophylaxis  - f/u 4 weeks -- DFE/OCT/possible injection OU +/- PRP fill in OS  4. History of Endophthalmitis OD  - s/p IVA OU 04/09/2018  - s/p 25g PPV w/ intravitreal vanc, ceftaz and cefepime OD, 2.14.2020  - s/p intravitreal tap / vanc and ceflaz injections (02.16.20)             - gram stain (2.14.20) shows G+ cocci, WBCs mostly PMNs;   - repeat gram stain from t/i (2.16.20) -- no organisms, just WBCs             - cultures from vitreous grew rare Staph warneri; cultures from t/i -- no growth             - doing well, BCVA 20/30             - inflammation/posterior debris resolved             - IOP 20 off Brimonidine  - completed po pred taper -- caused significant elevations in BG  - monitor  5. History of Vitreous Hemorrhage OD -- cleared  from PPV x2 for endophthalmitis and ERM/preretinal fibrosis  - secondary to PDR as described below  - S/P IVA OD #1 (09.20.19), #2 (10.25.19), #3 (11.15.19), #4 (12.16.19), #5 (03/12/2018)  - S/P PRP OD #1 (09.27.19), fill in OD (11.21.19) -- each somewhat limited inferiorly by residual VH  6,7. Hypertensive retinopathy OU  - discussed importance of tight BP control.  - monitor for now  8. Combined form age related cataract OS-   - The symptoms of  cataract, surgical options, and treatments and risks were discussed with patient.  - discussed diagnosis and progression  9. Pseudophakia OD  - s/p CE/IOL OD (Dr. Zenia Resides, 12.11.20)  - beautiful surgery, doing well     Ophthalmic Meds Ordered this visit:  Meds ordered this encounter  Medications  . Bevacizumab (AVASTIN) SOLN 1.25 mg  . Bevacizumab (AVASTIN) SOLN 1.25 mg       Return in about 4 weeks (around 05/12/2019) for f/u PDR OU, DFE, OCT.  There are no Patient Instructions on file for this visit.   Explained the diagnoses, plan, and follow up with the patient and they expressed understanding.  Patient expressed understanding of the importance of proper follow up care.   This document serves as a record of services personally performed by Gardiner Sleeper, MD, PhD. It was created on their behalf by Leeann Must, [place], a certified ophthalmic assistant. The creation of this record is the provider's dictation and/or activities during the visit.    Electronically signed by: Leeann Must, COA @TODAY @ 4:23 PM   This document serves as a record of services personally performed by Gardiner Sleeper, MD, PhD. It was created on their behalf by Ernest Mallick, OA, an ophthalmic assistant. The creation of this record is the provider's dictation and/or activities during the visit.    Electronically signed by: Ernest Mallick, OA 02.15.2021 4:23 PM   Gardiner Sleeper, M.D., Ph.D. Diseases & Surgery of the Retina and Vitreous [facility]  I have reviewed the above documentation for accuracy and completeness, and I agree with the above. Gardiner Sleeper, M.D., Ph.D. 04/14/19 4:23 PM   Abbreviations: M myopia (nearsighted); A astigmatism; H hyperopia (farsighted); P presbyopia; Mrx spectacle prescription;  CTL contact lenses; OD right eye; OS left eye; OU both eyes  XT exotropia; ET esotropia; PEK punctate epithelial keratitis; PEE punctate epithelial erosions; DES dry eye syndrome; MGD meibomian gland dysfunction; ATs artificial tears; PFAT's preservative free artificial tears; [place] nuclear sclerotic cataract; PSC posterior subcapsular cataract; ERM epi-retinal membrane; PVD posterior vitreous detachment; RD retinal detachment; DM diabetes mellitus; DR diabetic retinopathy; NPDR non-proliferative diabetic retinopathy; PDR proliferative diabetic retinopathy; CSME clinically significant macular edema; DME diabetic macular edema; dbh dot blot hemorrhages; CWS cotton wool spot; POAG primary open angle glaucoma; C/D cup-to-disc ratio; HVF humphrey visual field; GVF goldmann visual field; OCT optical coherence tomography; IOP intraocular pressure; BRVO Branch retinal vein occlusion; CRVO central retinal vein occlusion; CRAO central retinal artery occlusion; BRAO branch retinal artery occlusion; RT retinal tear; SB scleral buckle; PPV pars plana vitrectomy; VH Vitreous hemorrhage; PRP panretinal laser photocoagulation; IVK intravitreal kenalog; VMT vitreomacular traction; MH Macular hole;  NVD neovascularization of the disc; NVE neovascularization elsewhere; AREDS age related eye disease study; ARMD age related macular degeneration; POAG primary open angle glaucoma; EBMD epithelial/anterior basement membrane dystrophy; ACIOL anterior chamber intraocular lens; IOL intraocular lens; PCIOL posterior chamber intraocular lens; Phaco/IOL phacoemulsification with intraocular lens placement; [place] photorefractive keratectomy; LASIK laser  assisted in situ keratomileusis; HTN hypertension; DM diabetes mellitus; COPD chronic obstructive pulmonary disease

## 2019-04-03 ENCOUNTER — Other Ambulatory Visit: Payer: Self-pay | Admitting: Family Medicine

## 2019-04-07 ENCOUNTER — Encounter (INDEPENDENT_AMBULATORY_CARE_PROVIDER_SITE_OTHER): Payer: BC Managed Care – PPO | Admitting: Ophthalmology

## 2019-04-08 ENCOUNTER — Ambulatory Visit (AMBULATORY_SURGERY_CENTER): Payer: BC Managed Care – PPO | Admitting: Gastroenterology

## 2019-04-08 ENCOUNTER — Other Ambulatory Visit: Payer: Self-pay

## 2019-04-08 ENCOUNTER — Encounter: Payer: Self-pay | Admitting: Gastroenterology

## 2019-04-08 VITALS — BP 99/79 | HR 87 | Temp 97.1°F | Resp 16 | Ht 63.0 in | Wt 166.0 lb

## 2019-04-08 DIAGNOSIS — Z86010 Personal history of colonic polyps: Secondary | ICD-10-CM

## 2019-04-08 DIAGNOSIS — K6282 Dysplasia of anus: Secondary | ICD-10-CM | POA: Diagnosis not present

## 2019-04-08 DIAGNOSIS — D123 Benign neoplasm of transverse colon: Secondary | ICD-10-CM | POA: Diagnosis not present

## 2019-04-08 DIAGNOSIS — K3189 Other diseases of stomach and duodenum: Secondary | ICD-10-CM | POA: Diagnosis not present

## 2019-04-08 DIAGNOSIS — D125 Benign neoplasm of sigmoid colon: Secondary | ICD-10-CM | POA: Diagnosis not present

## 2019-04-08 DIAGNOSIS — K2950 Unspecified chronic gastritis without bleeding: Secondary | ICD-10-CM | POA: Diagnosis not present

## 2019-04-08 DIAGNOSIS — K3184 Gastroparesis: Secondary | ICD-10-CM

## 2019-04-08 DIAGNOSIS — Z860101 Personal history of adenomatous and serrated colon polyps: Secondary | ICD-10-CM

## 2019-04-08 DIAGNOSIS — D509 Iron deficiency anemia, unspecified: Secondary | ICD-10-CM | POA: Diagnosis not present

## 2019-04-08 DIAGNOSIS — D126 Benign neoplasm of colon, unspecified: Secondary | ICD-10-CM

## 2019-04-08 HISTORY — DX: Personal history of adenomatous and serrated colon polyps: Z86.0101

## 2019-04-08 HISTORY — PX: COLONOSCOPY: SHX174

## 2019-04-08 HISTORY — DX: Personal history of colonic polyps: Z86.010

## 2019-04-08 HISTORY — PX: ESOPHAGOGASTRODUODENOSCOPY: SHX1529

## 2019-04-08 MED ORDER — SODIUM CHLORIDE 0.9 % IV SOLN: 500.0000 mL | Freq: Once | INTRAVENOUS | Status: DC

## 2019-04-08 MED ORDER — METOCLOPRAMIDE HCL 5 MG PO TABS: 5.0000 mg | ORAL_TABLET | Freq: Three times a day (TID) | ORAL | 1 refills | Status: DC | PRN

## 2019-04-08 NOTE — Progress Notes (Signed)
Called to room to assist during endoscopic procedure.  Patient ID and intended procedure confirmed with present staff. Received instructions for my participation in the procedure from the performing physician.  

## 2019-04-08 NOTE — Progress Notes (Signed)
Report to PACU, RN, vss, BBS= Clear.  

## 2019-04-08 NOTE — Progress Notes (Signed)
Pt's states no medical or surgical changes since previsit or office visit.  JB - temp NS - vitals

## 2019-04-08 NOTE — Op Note (Signed)
[facility] Patient Name: Gloria Gomez Procedure Date: 04/08/2019 1:28 PM MRN: [id number] Endoscopist: Remo Lipps P. Havery Moros , MD Age: 49 Referring MD:  Date of Birth: 06/08/1970 Gender: Female Account #: [id number] Procedure:                Upper GI endoscopy Indications:              Iron deficiency anemia, suspected gastroparesis -                            rule out gastric outlet obstruction Medicines:                Monitored Anesthesia Care Procedure:                Pre-Anesthesia Assessment:                           - Prior to the procedure, a History and Physical                            was performed, and patient medications and                            allergies were reviewed. [name] tolerance of                            previous anesthesia was also reviewed. The risks                            and benefits of the procedure and the sedation                            options and risks were discussed with the patient.                            All questions were answered, and informed consent                            was obtained. Prior Anticoagulants: The patient has                            taken no previous anticoagulant or antiplatelet                            agents. ASA Grade Assessment: II - A patient with                            mild systemic disease. After reviewing the risks                            and benefits, the patient was deemed in                            satisfactory condition to undergo the procedure.  After obtaining informed consent, the endoscope was                            passed under direct vision. Throughout the                            procedure, the patient's blood pressure, pulse, and                            oxygen saturations were monitored continuously. The                            Endoscope was introduced through the mouth, and                            advanced to  the second part of duodenum. The upper                            GI endoscopy was accomplished without difficulty.                            The patient tolerated the procedure well. Scope In: Scope Out: Findings:                 Esophagogastric landmarks were identified: the                            Z-line was found at 35 cm, the gastroesophageal                            junction was found at 35 cm and the upper extent of                            the gastric folds was found at 35 cm from the                            incisors.                           The exam of the esophagus was otherwise normal.                           Retained fluid was found in the stomach which was                            suctioned.                           Patchy mildly erythematous mucosa was found in the                            gastric antrum without ulceration.                           The exam of the  stomach was otherwise normal.                           Biopsies were taken with a cold forceps in the                            gastric body, at the incisura and in the gastric                            antrum for Helicobacter pylori testing.                           The duodenal bulb and second portion of the                            duodenum were normal. Biopsies for histology were                            taken with a cold forceps for evaluation of celiac                            disease. Complications:            No immediate complications. Estimated blood loss:                            Minimal. Estimated Blood Loss:     Estimated blood loss was minimal. Impression:               - Esophagogastric landmarks identified.                           - Normal esophagus otherwise.                           - Retained gastric fluid.                           - Erythematous mucosa in the antrum.                           - Normal stomach otherwise - biopsies taken to rule                             out H pylori                           - Normal duodenal bulb and second portion of the                            duodenum. Biopsied.                           No obvious cause for iron deficiency based on gross  exam, will await biopsy results. Recommendation:           - Patient has a contact number available for                            emergencies. The signs and symptoms of potential                            delayed complications were discussed with the                            patient. Return to normal activities tomorrow.                            Written discharge instructions were provided to the                            patient.                           - Resume previous diet.                           - Continue present medications.                           - If symptoms from gastroparesis, recommend trial                            of Reglan 5mg  three times daily with meals as needed                           - Await pathology results with further                            recommendations Remo Lipps P. Havery Moros, MD 04/08/2019 2:26:04 PM This report has been signed electronically.

## 2019-04-08 NOTE — Patient Instructions (Signed)
YOU HAD AN ENDOSCOPIC PROCEDURE TODAY AT THE [facility]:   Refer to the procedure report that was given to you for any specific questions about what was found during the examination.  If the procedure report does not answer your questions, please call your gastroenterologist to clarify.  If you requested that your care partner not be given the details of your procedure findings, then the procedure report has been included in a sealed envelope for you to review at your convenience later.  YOU SHOULD EXPECT: Some feelings of bloating in the abdomen. Passage of more gas than usual.  Walking can help get rid of the air that was put into your GI tract during the procedure and reduce the bloating. If you had a lower endoscopy (such as a colonoscopy or flexible sigmoidoscopy) you may notice spotting of blood in your stool or on the toilet paper. If you underwent a bowel prep for your procedure, you may not have a normal bowel movement for a few days.  Please Note:  You might notice some irritation and congestion in your nose or some drainage.  This is from the oxygen used during your procedure.  There is no need for concern and it should clear up in a day or so.  SYMPTOMS TO REPORT IMMEDIATELY:   Following lower endoscopy (colonoscopy or flexible sigmoidoscopy):  Excessive amounts of blood in the stool  Significant tenderness or worsening of abdominal pains  Swelling of the abdomen that is new, acute  Fever of 100F or higher   Following upper endoscopy (EGD)  Vomiting of blood or coffee ground material  New chest pain or pain under the shoulder blades  Painful or persistently difficult swallowing  New shortness of breath  Fever of 100F or higher  Black, tarry-looking stools  For urgent or emergent issues, a gastroenterologist can be reached at any hour by calling [phone].   DIET:  We do recommend a small meal at first, but then you may proceed to your regular diet.  Drink  plenty of fluids but you should avoid alcoholic beverages for 24 hours.  ACTIVITY:  You should plan to take it easy for the rest of today and you should NOT DRIVE or use heavy machinery until tomorrow (because of the sedation medicines used during the test).    FOLLOW UP: Our staff will call the number listed on your records 48-72 hours following your procedure to check on you and address any questions or concerns that you may have regarding the information given to you following your procedure. If we do not reach you, we will leave a message.  We will attempt to reach you two times.  During this call, we will ask if you have developed any symptoms of COVID 19. If you develop any symptoms (ie: fever, flu-like symptoms, shortness of breath, cough etc.) before then, please call [phone].  If you test positive for Covid 19 in the 2 weeks post procedure, please call and report this information to us.    If any biopsies were taken you will be contacted by phone or by letter within the next 1-3 weeks.  Please call us at [phone] if you have not heard about the biopsies in 3 weeks.    SIGNATURES/CONFIDENTIALITY: You and/or your care partner have signed paperwork which will be entered into your electronic medical record.  These signatures attest to the fact that that the information above on your After Visit Summary has been reviewed and is   understood.  Full responsibility of the confidentiality of this discharge information lies with you and/or your care-partner. 

## 2019-04-08 NOTE — Op Note (Signed)
[facility] Patient Name: Gloria Gomez Procedure Date: 04/08/2019 1:28 PM MRN: [id number] Endoscopist: Remo Lipps P. Havery Moros , MD Age: 49 Referring MD:  Date of Birth: April 16, 1970 Gender: Female Account #: [id number] Procedure:                Colonoscopy Indications:              Iron deficiency anemia, first colonoscopy Medicines:                Monitored Anesthesia Care Procedure:                Pre-Anesthesia Assessment:                           - Prior to the procedure, a History and Physical                            was performed, and patient medications and                            allergies were reviewed. [name] tolerance of                            previous anesthesia was also reviewed. The risks                            and benefits of the procedure and the sedation                            options and risks were discussed with the patient.                            All questions were answered, and informed consent                            was obtained. Prior Anticoagulants: The patient has                            taken no previous anticoagulant or antiplatelet                            agents. ASA Grade Assessment: II - A patient with                            mild systemic disease. After reviewing the risks                            and benefits, the patient was deemed in                            satisfactory condition to undergo the procedure.                           After obtaining informed consent, the colonoscope  was passed under direct vision. Throughout the                            procedure, the patient's blood pressure, pulse, and                            oxygen saturations were monitored continuously. The                            Colonoscope was introduced through the anus and                            advanced to the the cecum, identified by                            appendiceal orifice  and ileocecal valve. The                            colonoscopy was performed without difficulty. The                            patient tolerated the procedure well. The quality                            of the bowel preparation was adequate. The                            ileocecal valve, appendiceal orifice, and rectum                            were photographed. Scope In: 1:43:20 PM Scope Out: 2:13:45 PM Scope Withdrawal Time: 0 hours 25 minutes 22 seconds  Total Procedure Duration: 0 hours 30 minutes 25 seconds  Findings:                 The perianal and digital rectal examinations were                            normal.                           A 3 mm polyp was found in the hepatic flexure. The                            polyp was sessile. The polyp was removed with a                            cold snare. Resection and retrieval were complete.                           A 4 mm polyp was found in the transverse colon. The                            polyp was sessile. The polyp was removed with a  cold snare. Resection and retrieval were complete.                           Two sessile polyps were found in the sigmoid colon.                            The polyps were diminutive in size. These polyps                            were removed with a cold snare. Resection and                            retrieval were complete.                           A 7 mm polyp was found in the sigmoid colon. The                            polyp was semi-pedunculated. The polyp was removed                            with a hot snare. Resection and retrieval were                            complete.                           Anal papilla(e) were hypertrophied. Biopsies were                            taken with a cold forceps for histology.                           The exam was otherwise without abnormality. The                            right colon was tortous and ileum  intubated was not                            successful. Several minutes were spent lavaging the                            colon to achieve adequate views. Complications:            No immediate complications. Estimated blood loss:                            Minimal. Estimated Blood Loss:     Estimated blood loss was minimal. Impression:               - One 3 mm polyp at the hepatic flexure, removed                            with a cold snare. Resected and retrieved.                           -  One 4 mm polyp in the transverse colon, removed                            with a cold snare. Resected and retrieved.                           - Two diminutive polyps in the sigmoid colon,                            removed with a cold snare. Resected and retrieved.                           - One 7 mm polyp in the sigmoid colon, removed with                            a hot snare. Resected and retrieved.                           - Anal papilla(e) were hypertrophied. Biopsied.                           - The examination was otherwise normal.                           No cause for iron deficiency on colonoscopy. Recommendation:           - Patient has a contact number available for                            emergencies. The signs and symptoms of potential                            delayed complications were discussed with the                            patient. Return to normal activities tomorrow.                            Written discharge instructions were provided to the                            patient.                           - Resume previous diet.                           - Continue present medications.                           - Await pathology results. Remo Lipps P. Terie Lear, MD 04/08/2019 2:21:37 PM This report has been signed electronically.

## 2019-04-10 ENCOUNTER — Telehealth: Payer: Self-pay | Admitting: *Deleted

## 2019-04-10 NOTE — Telephone Encounter (Signed)
Attempted f/u phone call. No answer. Left message. °

## 2019-04-14 ENCOUNTER — Encounter (INDEPENDENT_AMBULATORY_CARE_PROVIDER_SITE_OTHER): Payer: Self-pay | Admitting: Ophthalmology

## 2019-04-14 ENCOUNTER — Ambulatory Visit (INDEPENDENT_AMBULATORY_CARE_PROVIDER_SITE_OTHER): Payer: BC Managed Care – PPO | Admitting: Ophthalmology

## 2019-04-14 DIAGNOSIS — Z961 Presence of intraocular lens: Secondary | ICD-10-CM

## 2019-04-14 DIAGNOSIS — E113513 Type 2 diabetes mellitus with proliferative diabetic retinopathy with macular edema, bilateral: Secondary | ICD-10-CM | POA: Diagnosis not present

## 2019-04-14 DIAGNOSIS — H4311 Vitreous hemorrhage, right eye: Secondary | ICD-10-CM

## 2019-04-14 DIAGNOSIS — H4312 Vitreous hemorrhage, left eye: Secondary | ICD-10-CM | POA: Diagnosis not present

## 2019-04-14 DIAGNOSIS — H3581 Retinal edema: Secondary | ICD-10-CM | POA: Diagnosis not present

## 2019-04-14 DIAGNOSIS — H25812 Combined forms of age-related cataract, left eye: Secondary | ICD-10-CM

## 2019-04-14 DIAGNOSIS — H44001 Unspecified purulent endophthalmitis, right eye: Secondary | ICD-10-CM

## 2019-04-14 DIAGNOSIS — H35033 Hypertensive retinopathy, bilateral: Secondary | ICD-10-CM

## 2019-04-14 DIAGNOSIS — I10 Essential (primary) hypertension: Secondary | ICD-10-CM

## 2019-04-14 MED ORDER — BEVACIZUMAB CHEMO INJECTION 1.25MG/0.05ML SYRINGE FOR KALEIDOSCOPE
1.2500 mg | INTRAVITREAL | Status: AC | PRN
  Administered 2019-04-14: 1.25 mg via INTRAVITREAL

## 2019-04-14 MED ORDER — BEVACIZUMAB CHEMO INJECTION 1.25MG/0.05ML SYRINGE FOR KALEIDOSCOPE
1.2500 mg | INTRAVITREAL | Status: AC | PRN
  Administered 2019-04-14: 16:00:00 1.25 mg via INTRAVITREAL

## 2019-04-18 ENCOUNTER — Other Ambulatory Visit: Payer: Self-pay | Admitting: Family Medicine

## 2019-04-24 ENCOUNTER — Encounter: Payer: Self-pay | Admitting: Family Medicine

## 2019-05-12 ENCOUNTER — Encounter (INDEPENDENT_AMBULATORY_CARE_PROVIDER_SITE_OTHER): Payer: BC Managed Care – PPO | Admitting: Ophthalmology

## 2019-05-12 DIAGNOSIS — K6282 Dysplasia of anus: Secondary | ICD-10-CM | POA: Diagnosis not present

## 2019-05-12 HISTORY — PX: ANOSCOPY: SHX299

## 2019-05-16 ENCOUNTER — Other Ambulatory Visit: Payer: Self-pay | Admitting: Family Medicine

## 2019-05-19 ENCOUNTER — Other Ambulatory Visit: Payer: Self-pay | Admitting: Family Medicine

## 2019-05-19 NOTE — Progress Notes (Signed)
[facility] Note  05/20/2019     CHIEF COMPLAINT Patient presents for Retina Follow Up   HISTORY OF PRESENT ILLNESS: Gloria Gomez is a 49 y.o. female who presents to the clinic today for:   HPI    Retina Follow Up    Patient presents with  Diabetic Retinopathy.  In both eyes.  This started 4 weeks ago.  Severity is moderate.  I, the attending physician,  performed the HPI with the patient and updated documentation appropriately.          Comments    Patient here for 4 weeks retina follow up for PDR OU. Patient states vision doing pretty good. Had cat sx OD. No eye pain. Hadn't slept good last couple nights- eye fatigue.       Last edited by Bernarda Caffey, MD on 05/20/2019  3:32 PM. (History)    pt states    Referring physician:   HISTORICAL INFORMATION:   Selected notes from the MEDICAL RECORD NUMBER Referred for DM exam    CURRENT MEDICATIONS: No current outpatient medications on file. (Ophthalmic Drugs)   No current facility-administered medications for this visit. (Ophthalmic Drugs)   Current Outpatient Medications (Other)  Medication Sig  . glipiZIDE (GLUCOTROL XL) 10 MG 24 hr tablet Take 1 tablet (10 mg total) by mouth daily with breakfast.  . HYDROcodone-acetaminophen (NORCO/VICODIN) 5-325 MG tablet Take 1-2 tablets by mouth every 6 (six) hours as needed for moderate pain.  . INVOKANA 300 MG TABS tablet TAKE 1 TABLET BY MOUTH ONCE DAILY BEFORE BREAKFAST  . JANUMET 50-1000 MG tablet TAKE 1 TABLET BY MOUTH TWICE DAILY WITH A MEAL  . liraglutide (VICTOZA) 18 MG/3ML SOPN Inject 0.2 mLs (1.2 mg total) into the skin daily. Inject 0.6mg  SQ daily for 1 week, then increase to 1.2mg  SQ daily.  Marland Kitchen losartan (COZAAR) 100 MG tablet Take 1 tablet by mouth once daily  . metoCLOPramide (REGLAN) 5 MG tablet Take 1 tablet (5 mg total) by mouth 3 (three) times daily with meals as needed for nausea.  . metoprolol succinate (TOPROL-XL) 100 MG 24 hr  tablet TAKE 1 TABLET BY MOUTH ONCE DAILY TAKE  WITH  OR  IMMEDIATELY  FOLLOWING  A  MEAL  . Multiple Vitamin (MULTIVITAMIN WITH MINERALS) TABS tablet Take 1 tablet by mouth daily.  Marland Kitchen OVER THE COUNTER MEDICATION Take 2 tablets by mouth daily. Neu Remedy  . pantoprazole (PROTONIX) 40 MG tablet Take 1 tablet by mouth once daily  . promethazine (PHENERGAN) 12.5 MG tablet 1-2 tabs po q6h prn nausea  . rosuvastatin (CRESTOR) 10 MG tablet Take 1 tablet (10 mg total) by mouth daily. (Patient taking differently: Take 10 mg by mouth every other day. )   No current facility-administered medications for this visit. (Other)      REVIEW OF SYSTEMS: ROS    Positive for: Endocrine, Eyes   Negative for: Constitutional, Gastrointestinal, Neurological, Skin, Genitourinary, Musculoskeletal, HENT, Cardiovascular, Respiratory, Psychiatric, Allergic/Imm, Heme/Lymph   Last edited by Theodore Demark, COA on 05/20/2019  3:10 PM. (History)       ALLERGIES Allergies  Allergen Reactions  . Gluten Meal Swelling  . Lisinopril Cough  . Pioglitazone Other (See Comments)    ELEVATED glucoses + worse chronic nausea    PAST MEDICAL HISTORY Past Medical History:  Diagnosis Date  . Abdominal bloating    likely from diab gastroparesis.  Dr. Havery Moros started trial of reglan 03/2019.  Marland Kitchen Benign brain tumor (  [place])    Cystic lesion in cerebral aqueduct region with mild hydrocephalus-- stable MRI 02/2016.  Surveillance MRI 05/2017 --dilated cerebral aqueduct related to aqueductal stenosis and subsequent mild hydrocephalus (due to the 11 mm stable cystic lesion in cerebral aqueduct---?congenitial?.  . Cataract    OU  . Diabetic retinopathy ([place])   . Dysmenorrhea    vicodin occ during first 2 days of cycle.  . Gluten intolerance    pt reports she underwent full GI w/u to r/o celiac dz  . Hepatic steatosis    ultrasound 08/2017  . History of adenomatous polyp of colon 04/08/2019   recall Feb 2024  . Hyperlipidemia,  mixed   . Hypertensive retinopathy of both eyes   . Insomnia   . Iron deficiency 01/2019   Hb 11.3. Hemoccults neg x 3 03/19/19. EGD and colonoscopy 04/08/19 showed NO cause for IDA.  Pt does have menorrhagia, though, so she'll see her GYN.  started FeSO4 325 qd approx 04/14/19.  Marland Kitchen Proliferative diabetic retinopathy of both eyes ([place])    steroid injections 10/2017--improved  . Sensorineural hearing loss of left ear    Sudden left hearing loss summer 2016--no improvement with steroids 01/2015 so brain MRI done by Dr. Redmond Baseman and it showed brain tumor that was determined to be benign.  Pt's hearing not bad enough for hearing aid as of 06/2016.  . Type 2 diabetes with complication ([place])    diab retpthy, diabetic gastroparesis (gastric emptying study mildly abnl 03/2017).  Recommended lantus 08/2018 but pt declined.  . White coat hypertension    Past Surgical History:  Procedure Laterality Date  . ANOSCOPY  05/12/2019   Procedure: normal exam, minimal hemorrhoid disease. Hyertrophied anal papila, benign appearing, posterior midline. Surgeon: Leighton Ruff MD  . CHOLECYSTECTOMY  2000  . COLONOSCOPY  04/08/2019   5 adenomas, recall 3 yrs; no cause for IDA found.  Hypertrophied anal papillae->bx showed low grade dysplasia; GI referred her to colorectal surgeon.  . ESOPHAGOGASTRODUODENOSCOPY  04/08/2019   mild chronic reactive gastritis. H pylori NEG.  No cause for IDA found.  . GAS INSERTION Right 10/31/2018   Procedure: Insertion Of C3F8 Gas;  Surgeon: Bernarda Caffey, MD;  Location: [place];  Service: Ophthalmology;  Laterality: Right;  . GASTRIC EMPTYING SCAN  04/20/2017   Mildly abnormal, particularly the 1st hour of emptying.  Marland Kitchen MEMBRANE PEEL Right 10/31/2018   Procedure: MEMBRANE PEEL;  Surgeon: Bernarda Caffey, MD;  Location: [place];  Service: Ophthalmology;  Laterality: Right;  . PARS PLANA VITRECTOMY Right 04/12/2018   Procedure: Right PARS PLANA VITRECTOMY WITH 25 GAUGE with intravitreal antibiotics;   Surgeon: Bernarda Caffey, MD;  Location: [place];  Service: Ophthalmology;  Laterality: Right;  . PARS PLANA VITRECTOMY Right 10/31/2018   Procedure: PARS PLANA VITRECTOMY WITH 25 GAUGE;  Surgeon: Bernarda Caffey, MD;  Location: [facility];  Service: Ophthalmology;  Laterality: Right;  . PHOTOCOAGULATION WITH LASER Right 10/31/2018   Procedure: Photocoagulation With Laser;  Surgeon: Bernarda Caffey, MD;  Location: [place];  Service: Ophthalmology;  Laterality: Right;    FAMILY HISTORY Family History  Problem Relation Age of Onset  . Brain cancer Mother   . Diabetes Father   . Diabetes Maternal Grandmother   . Cataracts Maternal Grandmother   . Cervical cancer Paternal Grandmother   . Colon cancer Maternal Grandfather 48  . Amblyopia Neg Hx   . Blindness Neg Hx   . Glaucoma Neg Hx   . Macular degeneration Neg Hx   .  Retinal detachment Neg Hx   . Strabismus Neg Hx   . Retinitis pigmentosa Neg Hx   . Esophageal cancer Neg Hx   . Stomach cancer Neg Hx   . Rectal cancer Neg Hx     SOCIAL HISTORY Social History   Tobacco Use  . Smoking status: Never Smoker  . Smokeless tobacco: Never Used  Substance Use Topics  . Alcohol use: No  . Drug use: No         OPHTHALMIC EXAM:  Base Eye Exam    Visual Acuity (Snellen - Linear)      Right Left   Dist [place] 20/20 -2    Dist cc  20/25 -2   Dist ph cc  20/20   Correction: Glasses  Patient had cat sx OD. No glasses OD. Glasses OS       Tonometry (Tonopen, 3:06 PM)      Right Left   Pressure 16 18       Pupils      Dark Light Shape React APD   Right 3 2 Round Brisk None   Left 3 2 Round Brisk None       Visual Fields (Counting fingers)      Left Right    Full Full       Extraocular Movement      Right Left    Full Full       Neuro/Psych    Oriented x3: Yes   Mood/Affect: Normal       Dilation    Both eyes: 1.0% Mydriacyl, 2.5% Phenylephrine @ 3:06 PM        Slit Lamp and Fundus Exam    Slit Lamp Exam      Right Left    Lids/Lashes Dermatochalasis - upper lid, mild Meibomian gland dysfunction, Telangiectasia Dermatochalasis - upper lid, Meibomian gland dysfunction, Telangiectasia   Conjunctiva/Sclera White and quiet, sutures intact White and quiet   Cornea Clear, trace Punctate epithelial erosions, well healed temporal cataract wounds Trace Punctate epithelial erosions   Anterior Chamber Deep and quiet Deep and quiet   Iris Round and dilated, No NVI Round and dilated, No NVI   Lens PC IOL in good position, 1-2+ Posterior capsular opacification, PC folds 2+ Nuclear sclerosis, 2+ Cortical cataract   Vitreous post vitrectomy, trace pigment Vitreous syneresis, +RBC in anterior vitroeus, diffuse VH clearing, VH settling inferiorly, vitreous condensations, blood clots settling inferiorly and clearing       Fundus Exam      Right Left   Disc mild Pallor, Sharp rim Pink and sharp   C/D Ratio 0.2 0.1   Macula Flat, blunted foveal reflex, central cystic chnages, scattered MA/exudate, cluster of MA and exudates greatest ST to fovea -- persistent Flat, blunted foveal reflex, scattered MA, scattered cystic changes, scattered punctate exudates, trace ERM nasally    Vessels mild Vascular attenuation, Tortuous Vascular attenuation, mild Tortuousity   Periphery Attached, tractional fibrosis in SN quadrant removed, scattered DBH greatest superiorly, 360 PRP, good laser fill in 360 attached, 360 PRP scars room for posterior fill in, inferior retina obscured by settling VH        Refraction    Wearing Rx      Sphere Cylinder   Right -3.75 Sphere   Left -3.75 Sphere          IMAGING AND PROCEDURES  Imaging and Procedures for @TODAY @  OCT, Retina - OU - Both Eyes       Right Eye Quality  was good. Central Foveal Thickness: 402. Progression has improved. Findings include abnormal foveal contour, epiretinal membrane, no SRF, intraretinal hyper-reflective material, vitreous traction, preretinal fibrosis (Mild interval  improvement in IRF).   Left Eye Quality was good. Central Foveal Thickness: 291. Progression has been stable. Findings include normal foveal contour, intraretinal fluid, no SRF (Mild interval increase in temporal IRF; Mild ERM, mild interval improvement in vitreous opacities ).   Notes *Images captured and stored on drive  Diagnosis / Impression:  DME OU OD: Mild interval improvement in IRF OS: Mild interval increase in temporal IRF; Mild ERM, mild interval improvement in vitreous opacities   Clinical management:  See below  Abbreviations: NFP - Normal foveal profile. CME - cystoid macular edema. PED - pigment epithelial detachment. IRF - intraretinal fluid. SRF - subretinal fluid. EZ - ellipsoid zone. ERM - epiretinal membrane. ORA - outer retinal atrophy. ORT - outer retinal tubulation. SRHM - subretinal hyper-reflective material         Intravitreal Injection, Pharmacologic Agent - OD - Right Eye       Time Out 05/20/2019. 3:16 PM. Confirmed correct patient, procedure, site, and patient consented.   Anesthesia Topical anesthesia was used. Anesthetic medications included Lidocaine 2%, Proparacaine 0.5%.   Procedure Preparation included 5% betadine to ocular surface, eyelid speculum. A (32g) needle was used.   Injection:  1.25 mg Bevacizumab (AVASTIN) SOLN   NDC: [id number], Lot: [id number] , Expiration date: 07/25/2019   Route: Intravitreal, Site: Right Eye, Waste: 0 mL  Post-op Post injection exam found visual acuity of at least counting fingers. The patient tolerated the procedure well. There were no complications. The patient received written and verbal post procedure care education.        Intravitreal Injection, Pharmacologic Agent - OS - Left Eye       Time Out 05/20/2019. 3:27 PM. Confirmed correct patient, procedure, site, and patient consented.   Anesthesia Topical anesthesia was used. Anesthetic medications included Lidocaine 2%, Proparacaine 0.5%.    Procedure Preparation included 5% betadine to ocular surface, eyelid speculum. A supplied (32g) needle was used.   Injection:  1.25 mg Bevacizumab (AVASTIN) SOLN   NDC: 06/02/2019, Lot: [id number] , Expiration date: 07/25/2019   Route: Intravitreal, Site: Left Eye, Waste: 0 mL  Post-op Post injection exam found visual acuity of at least counting fingers. The patient tolerated the procedure well. There were no complications. The patient received written and verbal post procedure care education.                ASSESSMENT/PLAN:    ICD-10-CM   1. Proliferative diabetic retinopathy of both eyes with macular edema associated with type 2 diabetes mellitus (HCC)  [id number] Intravitreal Injection, Pharmacologic Agent - OD - Right Eye    Intravitreal Injection, Pharmacologic Agent - OS - Left Eye    Bevacizumab (AVASTIN) SOLN 1.25 mg    Bevacizumab (AVASTIN) SOLN 1.25 mg  2. Retinal edema  H35.81 OCT, Retina - OU - Both Eyes  3. Vitreous hemorrhage of left eye (HCC)  H43.12   4. Right endophthalmia  H44.001   5. Vitreous hemorrhage, right eye ([place])  H43.11   6. Essential hypertension  I10   7. Hypertensive retinopathy of both eyes  H35.033   8. Combined forms of age-related cataract of left eye  H25.812   9. Pseudophakia of right eye  Z96.1     1,2.  Proliferative diabetic retinopathy w/ DME, OU  - HbA1c 8.3% (12.2.20), 8.6% (06.30.20), 8.4% (02.14.20)  - s/p  IVA OD #1 9.20.19, #2 (10.25.19), #3 (11.15.19), #4 (12.17.19), #5 (01.14.20), #6 (2.11.20), #7 (05.29.20), #8 (08.19.20), #9 (10.30.20), #10 (12.09.20), #11 (01.11.21), #12 (02.15.21)  - s/p IVA OS #1 9.27.19, #2 (10.25.19), #3 (11.15.19), #4 (12.17.19), #5 (01.14.20), #6 (2.11.20), #7 (04.26.20), #8 (05.29.20), #9 (06.26.20), #10 (08.05.20), #11 (11.11.20), #12 (12.09.20), #13 (01.11.21), #14 (02.15.21)  - S/P PRP OS (09.20.19), (5.19.20), (08.19.20)  - S/P PRP OD (9.27.19 and 11.21.19), fill-in (04.14.20) (09.03.20,  surgery)  - FA (9.20.19) shows +NVE OU and leaking MA and capillary nonperfusion  - repeat FA 11.15.19 shows NV regressing OU  - pre-op: OD w/ VA stable at 20/25, but there is some preretinal fibrosis / tractional membranes just superior to disc and mild central DME  - s/p 25g PPV+MP+10% C3F8 gas OD (09.03.20) -- ERM/PRF removal OD  - BCVA improved to 20/20 from 20/40 following cataract surgery -- vision today is 20/20             - fibrosis/ERM stably improved; retina attached  - OCT shows persistent IRF OU  - recommend IVA OU (OD #13 and OS #15) today, 03.23.21  - pt wishes to proceed with IVA OU  - RBA of procedure discussed, questions answered  - informed consent obtained   - see procedure note  - Avastin informed consent form signed and scanned on 01.11.2021 (OU)  - f/u 4 weeks -- DFE/OCT/possible injection OU  3. Vitreous hemorrhage OS -- improving  - recurrent VH, onset 11.11.20  - initial onset 04.24.20  - etiology: secondary to PDR as described above (no RT/RD on exam)  - s/p PRP OS (9.20.19), (05.19.20), (08.19.20)  - s/p IVA OS on 4.26.20, 5.29.20, 6.26.20, 8.5.20, and 11.11.20, 12.06.20  - today, further improvement in diffuse VH post-IVA OS -- pt reports improvement in floaters  - BCVA back to 20/20  - recommend IVA OS #15 as above  - may benefit from further PRP fill in OS for VH prophylaxis  - f/u 4 weeks -- DFE/OCT/possible injection OU +/- PRP fill in OS  4. History of Endophthalmitis OD  - s/p IVA OU 04/09/2018  - s/p 25g PPV w/ intravitreal vanc, ceftaz and cefepime OD, 2.14.2020  - s/p intravitreal tap / vanc and ceflaz injections (02.16.20)             - gram stain (2.14.20) shows G+ cocci, WBCs mostly PMNs;   - repeat gram stain from t/i (2.16.20) -- no organisms, just WBCs             - cultures from vitreous grew rare Staph warneri; cultures from t/i -- no growth             - doing well, BCVA 20/30             - inflammation/posterior debris resolved              - IOP 16 off Brimonidine  - completed po pred taper -- caused significant elevations in BG  - monitor  5. History of Vitreous Hemorrhage OD -- cleared from PPV x2 for endophthalmitis and ERM/preretinal fibrosis  - secondary to PDR as described below  - S/P IVA OD #1 (09.20.19), #2 (10.25.19), #3 (11.15.19), #4 (12.16.19), #5 (03/12/2018)  - S/P PRP OD #1 (09.27.19), fill in OD (11.21.19) -- each somewhat limited inferiorly by residual VH  6,7. Hypertensive retinopathy OU  - discussed importance of tight BP control.  - monitor for now  8. Combined form age related cataract  OS-   - The symptoms of cataract, surgical options, and treatments and risks were discussed with patient.  - discussed diagnosis and progression  9. Pseudophakia OD  - s/p CE/IOL OD (Dr. Zenia Resides, 12.11.20)  - beautiful surgery, doing well   Ophthalmic Meds Ordered this visit:  Meds ordered this encounter  Medications  . Bevacizumab (AVASTIN) SOLN 1.25 mg  . Bevacizumab (AVASTIN) SOLN 1.25 mg       Return in about 4 weeks (around 06/17/2019) for f/u PDR OU, DFE, OCT.  There are no Patient Instructions on file for this visit.   Explained the diagnoses, plan, and follow up with the patient and they expressed understanding.  Patient expressed understanding of the importance of proper follow up care.   This document serves as a record of services personally performed by Gardiner Sleeper, MD, PhD. It was created on their behalf by Estill Bakes, COT an ophthalmic technician. The creation of this record is the provider's dictation and/or activities during the visit.    Electronically signed by: Estill Bakes, COT 05/19/19 @ 4:27 PM   This document serves as a record of services personally performed by Gardiner Sleeper, MD, PhD. It was created on their behalf by Ernest Mallick, OA, an ophthalmic assistant. The creation of this record is the provider's dictation and/or activities during the visit.     Electronically signed by: Ernest Mallick, OA 03.23.2021 4:27 PM  Gardiner Sleeper, M.D., Ph.D. Diseases & Surgery of the Retina and [place] 05/20/2019   I have reviewed the above documentation for accuracy and completeness, and I agree with the above. Gardiner Sleeper, M.D., Ph.D. 05/20/19 4:27 PM  Abbreviations: M myopia (nearsighted); A astigmatism; H hyperopia (farsighted); P presbyopia; Mrx spectacle prescription;  CTL contact lenses; OD right eye; OS left eye; OU both eyes  XT exotropia; ET esotropia; PEK punctate epithelial keratitis; PEE punctate epithelial erosions; DES dry eye syndrome; MGD meibomian gland dysfunction; ATs artificial tears; PFAT's preservative free artificial tears; [place] nuclear sclerotic cataract; PSC posterior subcapsular cataract; ERM epi-retinal membrane; PVD posterior vitreous detachment; RD retinal detachment; DM diabetes mellitus; DR diabetic retinopathy; NPDR non-proliferative diabetic retinopathy; PDR proliferative diabetic retinopathy; CSME clinically significant macular edema; DME diabetic macular edema; dbh dot blot hemorrhages; CWS cotton wool spot; POAG primary open angle glaucoma; C/D cup-to-disc ratio; HVF humphrey visual field; GVF goldmann visual field; OCT optical coherence tomography; IOP intraocular pressure; BRVO Branch retinal vein occlusion; CRVO central retinal vein occlusion; CRAO central retinal artery occlusion; BRAO branch retinal artery occlusion; RT retinal tear; SB scleral buckle; PPV pars plana vitrectomy; VH Vitreous hemorrhage; PRP panretinal laser photocoagulation; IVK intravitreal kenalog; VMT vitreomacular traction; MH Macular hole;  NVD neovascularization of the disc; NVE neovascularization elsewhere; AREDS age related eye disease study; ARMD age related macular degeneration; POAG primary open angle glaucoma; EBMD epithelial/anterior basement membrane dystrophy; ACIOL anterior chamber intraocular lens; IOL  intraocular lens; PCIOL posterior chamber intraocular lens; Phaco/IOL phacoemulsification with intraocular lens placement; [place] photorefractive keratectomy; LASIK laser assisted in situ keratomileusis; HTN hypertension; DM diabetes mellitus; COPD chronic obstructive pulmonary disease

## 2019-05-20 ENCOUNTER — Ambulatory Visit (INDEPENDENT_AMBULATORY_CARE_PROVIDER_SITE_OTHER): Payer: BC Managed Care – PPO | Admitting: Ophthalmology

## 2019-05-20 ENCOUNTER — Encounter (INDEPENDENT_AMBULATORY_CARE_PROVIDER_SITE_OTHER): Payer: Self-pay | Admitting: Ophthalmology

## 2019-05-20 ENCOUNTER — Telehealth: Payer: Self-pay

## 2019-05-20 DIAGNOSIS — I10 Essential (primary) hypertension: Secondary | ICD-10-CM

## 2019-05-20 DIAGNOSIS — H25812 Combined forms of age-related cataract, left eye: Secondary | ICD-10-CM

## 2019-05-20 DIAGNOSIS — E113513 Type 2 diabetes mellitus with proliferative diabetic retinopathy with macular edema, bilateral: Secondary | ICD-10-CM | POA: Diagnosis not present

## 2019-05-20 DIAGNOSIS — H4312 Vitreous hemorrhage, left eye: Secondary | ICD-10-CM

## 2019-05-20 DIAGNOSIS — H44001 Unspecified purulent endophthalmitis, right eye: Secondary | ICD-10-CM | POA: Diagnosis not present

## 2019-05-20 DIAGNOSIS — H3581 Retinal edema: Secondary | ICD-10-CM | POA: Diagnosis not present

## 2019-05-20 DIAGNOSIS — Z961 Presence of intraocular lens: Secondary | ICD-10-CM

## 2019-05-20 DIAGNOSIS — H4311 Vitreous hemorrhage, right eye: Secondary | ICD-10-CM

## 2019-05-20 DIAGNOSIS — H35033 Hypertensive retinopathy, bilateral: Secondary | ICD-10-CM

## 2019-05-20 MED ORDER — BEVACIZUMAB CHEMO INJECTION 1.25MG/0.05ML SYRINGE FOR KALEIDOSCOPE
1.2500 mg | INTRAVITREAL | Status: AC | PRN
  Administered 2019-05-20: 1.25 mg via INTRAVITREAL

## 2019-05-20 NOTE — Telephone Encounter (Signed)
Patient assistance form regarding Janumet Rx. Placed on PCP desk to review and sign, if appropriate.

## 2019-05-20 NOTE — Telephone Encounter (Signed)
Patient dropped off [employer] Patient Assistance Enrollment Form for completion.

## 2019-05-22 NOTE — Telephone Encounter (Signed)
Signed and put on your desk. Signed:  Crissie Sickles, MD           05/22/2019

## 2019-05-23 ENCOUNTER — Encounter: Payer: Self-pay | Admitting: Family Medicine

## 2019-05-23 ENCOUNTER — Other Ambulatory Visit: Payer: Self-pay

## 2019-05-23 ENCOUNTER — Ambulatory Visit (INDEPENDENT_AMBULATORY_CARE_PROVIDER_SITE_OTHER): Payer: BC Managed Care – PPO | Admitting: Family Medicine

## 2019-05-23 VITALS — BP 126/84 | Temp 98.0°F | Wt 162.6 lb

## 2019-05-23 DIAGNOSIS — E11319 Type 2 diabetes mellitus with unspecified diabetic retinopathy without macular edema: Secondary | ICD-10-CM

## 2019-05-23 DIAGNOSIS — IMO0002 Reserved for concepts with insufficient information to code with codable children: Secondary | ICD-10-CM

## 2019-05-23 DIAGNOSIS — N920 Excessive and frequent menstruation with regular cycle: Secondary | ICD-10-CM

## 2019-05-23 DIAGNOSIS — R7401 Elevation of levels of liver transaminase levels: Secondary | ICD-10-CM

## 2019-05-23 DIAGNOSIS — E1165 Type 2 diabetes mellitus with hyperglycemia: Secondary | ICD-10-CM

## 2019-05-23 DIAGNOSIS — I10 Essential (primary) hypertension: Secondary | ICD-10-CM

## 2019-05-23 DIAGNOSIS — D500 Iron deficiency anemia secondary to blood loss (chronic): Secondary | ICD-10-CM

## 2019-05-23 DIAGNOSIS — E7800 Pure hypercholesterolemia, unspecified: Secondary | ICD-10-CM

## 2019-05-23 DIAGNOSIS — K760 Fatty (change of) liver, not elsewhere classified: Secondary | ICD-10-CM

## 2019-05-23 MED ORDER — METFORMIN HCL 1000 MG PO TABS: 1000.0000 mg | ORAL_TABLET | Freq: Two times a day (BID) | ORAL | 1 refills | Status: DC

## 2019-05-23 NOTE — Progress Notes (Signed)
Virtual Visit via Video Note  I connected with Gloria Gomez on 05/23/19 at 11:30 AM EDT by a video enabled telemedicine application and verified that I am speaking with the correct person using two identifiers.  Location patient: home Location provider:work or home office Persons participating in the virtual visit: patient, provider  I discussed the limitations of evaluation and management by telemedicine and the availability of in person appointments. The patient expressed understanding and agreed to proceed.  Telemedicine visit is a necessity given the COVID-19 restrictions in place at the current time.  HPI: 49 y/o WF being seen for 4 mo f/u DM 2 with retinopathy, gastroparesis, DPN.  Also f/u HTN, HLD, and IDA. A/P as of last visit: "1) DM 2, control improved per her report of home glucoses. HbA1c-future. Lytes/cr future. TSH screen.  2) HTN: The current medical regimen is effective;  continue present plan and medications. Lytes/cr -future.  3) HLD: tolerating crestor x 4-6 wks. Recheck FLP-future.  4) Normocytic anemia: review of last labs done in hosp when she had an eye surgery 10/2018, her Hb was down.  Will recheck CBC plus check iron panel and B12 level.  5) Fibromyalgia syndrome suspected: no red flags for rheum/connective tissue dz. Symptoms relatively well controlled with tylenol so I recommended she continue this for now.  Result note from 01/29/2019 labs: "Bad cholesterol number markedly improved. HbA1c stable at 8.3% but still not good glucose control. I recommend she non-insulin injection med called victoza. If Gloria Gomez agreeable, pls eRx victoza pens, 1.2 mg SQ qd, #5 pens, RF x 3. Tell her to just give 0.6 mg daly for the first week, then increase to full dose of 1.2 mg daily. Continue all current diabetes meds as well. Blood counts normal but iron is low. Often this is due to microscopic bleeding coming from the gastrointestinal tract. Needs home hemoccult x 3 and I  recommend referral to gastroenterologist to likely get upper and lower endoscopy to look for any abnormal tumor that may be causing the microscopic bleeding. Make sure she is not taking any anti-inflammatory medication like advil, ibuprofen, midol, naproxen, aleve. Tylenol is fine. Also, ask if she donates blood and if so how often".  Interim hx: LAst 2 wks glucose 120s, prior to that were significantly higher.  She has been off bedrest from her eye problems recently and has been more active.  Working on lower carbs, esp with supper. She says vision 20/20-20/25 in R eye and is 20/20 in L eye despite all her eye issues/procedures.  HTN: home monitoring 120s/80 avge. HLD still tolerating crestor qod b/c too many body aches if taking daily.  Hx of IDA:  ROS: no fevers, no CP, no SOB, no wheezing, no cough, no dizziness, no HAs, no rashes, no melena/hematochezia.  No polyuria or polydipsia.  Has constant waxing/waning myalgias or arthralgias. No joint swelling.  No focal weakness, paresthesias, or tremors.  No acute vision or hearing abnormalities. No n/v/d or abd pain.  No palpitations.   10 yrs of heavy menstrual bleeding, regularly occurring cycles, no intermenstrual bleeding, uses avg 8 tampons and pads per day the first 3d of menses, then "average" for the next 5-7d. Since her GI eval revealed no source of bleeding, it is likely that her menorrhagia has led to the iron def anemia.     Past Medical History:  Diagnosis Date  . Abdominal bloating    likely from diab gastroparesis.  Dr. Havery Moros started trial of reglan 03/2019.  Marland Kitchen  Benign brain tumor ([place])    Cystic lesion in cerebral aqueduct region with mild hydrocephalus-- stable MRI 02/2016.  Surveillance MRI 05/2017 --dilated cerebral aqueduct related to aqueductal stenosis and subsequent mild hydrocephalus (due to the 11 mm stable cystic lesion in cerebral aqueduct---?congenitial?.  . Cataract    OU  . Diabetic retinopathy ([place])   .  Dysmenorrhea    vicodin occ during first 2 days of cycle.  . Gluten intolerance    Gloria Gomez reports she underwent full GI w/u to r/o celiac dz  . Hepatic steatosis    ultrasound 08/2017  . History of adenomatous polyp of colon 04/08/2019   recall Feb 2024  . Hyperlipidemia, mixed   . Hypertensive retinopathy of both eyes   . Insomnia   . Iron deficiency 01/2019   Hb 11.3. Hemoccults neg x 3 03/19/19. EGD and colonoscopy 04/08/19 showed NO cause for IDA.  Gloria Gomez does have menorrhagia, though, so she'll see her GYN.  started FeSO4 325 qd approx 04/14/19.  Marland Kitchen Proliferative diabetic retinopathy of both eyes ([place])    steroid injections 10/2017--improved  . Sensorineural hearing loss of left ear    Sudden left hearing loss summer 2016--no improvement with steroids 01/2015 so brain MRI done by Dr. Redmond Baseman and it showed brain tumor that was determined to be benign.  Gloria Gomez's hearing not bad enough for hearing aid as of 06/2016.  . Type 2 diabetes with complication ([place])    diab retpthy, diabetic gastroparesis (gastric emptying study mildly abnl 03/2017).  Recommended lantus 08/2018 but Gloria Gomez declined.  . White coat hypertension     Past Surgical History:  Procedure Laterality Date  . ANOSCOPY  05/12/2019   Procedure: normal exam, minimal hemorrhoid disease. Hyertrophied anal papila, benign appearing, posterior midline. Surgeon: Leighton Ruff MD  . CHOLECYSTECTOMY  2000  . COLONOSCOPY  04/08/2019   5 adenomas, recall 3 yrs; no cause for IDA found.  Hypertrophied anal papillae->bx showed low grade dysplasia; GI referred her to colorectal surgeon.  . ESOPHAGOGASTRODUODENOSCOPY  04/08/2019   mild chronic reactive gastritis. H pylori NEG.  No cause for IDA found.  . GAS INSERTION Right 10/31/2018   Procedure: Insertion Of C3F8 Gas;  Surgeon: Bernarda Caffey, MD;  Location: [facility];  Service: Ophthalmology;  Laterality: Right;  . GASTRIC EMPTYING SCAN  04/20/2017   Mildly abnormal, particularly the 1st hour of emptying.  Marland Kitchen  MEMBRANE PEEL Right 10/31/2018   Procedure: MEMBRANE PEEL;  Surgeon: Bernarda Caffey, MD;  Location: [facility];  Service: Ophthalmology;  Laterality: Right;  . PARS PLANA VITRECTOMY Right 04/12/2018   Procedure: Right PARS PLANA VITRECTOMY WITH 25 GAUGE with intravitreal antibiotics;  Surgeon: Bernarda Caffey, MD;  Location: [facility];  Service: Ophthalmology;  Laterality: Right;  . PARS PLANA VITRECTOMY Right 10/31/2018   Procedure: PARS PLANA VITRECTOMY WITH 25 GAUGE;  Surgeon: Bernarda Caffey, MD;  Location: [facility];  Service: Ophthalmology;  Laterality: Right;  . PHOTOCOAGULATION WITH LASER Right 10/31/2018   Procedure: Photocoagulation With Laser;  Surgeon: Bernarda Caffey, MD;  Location: [facility];  Service: Ophthalmology;  Laterality: Right;    Family History  Problem Relation Age of Onset  . Brain cancer Mother   . Diabetes Father   . Diabetes Maternal Grandmother   . Cataracts Maternal Grandmother   . Cervical cancer Paternal Grandmother   . Colon cancer Maternal Grandfather 15  . Amblyopia Neg Hx   . Blindness Neg Hx   . Glaucoma Neg Hx   . Macular degeneration Neg Hx   .  Retinal detachment Neg Hx   . Strabismus Neg Hx   . Retinitis pigmentosa Neg Hx   . Esophageal cancer Neg Hx   . Stomach cancer Neg Hx   . Rectal cancer Neg Hx      Current Outpatient Medications:  .  glipiZIDE (GLUCOTROL XL) 10 MG 24 hr tablet, Take 1 tablet (10 mg total) by mouth daily with breakfast., Disp: 90 tablet, Rfl: 1 .  INVOKANA 300 MG TABS tablet, TAKE 1 TABLET BY MOUTH ONCE DAILY BEFORE BREAKFAST, Disp: 30 tablet, Rfl: 0 .  JANUMET 50-1000 MG tablet, TAKE 1 TABLET BY MOUTH TWICE DAILY WITH A MEAL, Disp: 180 tablet, Rfl: 0 .  liraglutide (VICTOZA) 18 MG/3ML SOPN, Inject 0.2 mLs (1.2 mg total) into the skin daily. Inject 0.6mg  SQ daily for 1 week, then increase to 1.2mg  SQ daily., Disp: 15 pen, Rfl: 3 .  losartan (COZAAR) 100 MG tablet, Take 1 tablet by mouth once daily, Disp: 90 tablet, Rfl: 1 .  metoprolol succinate  (TOPROL-XL) 100 MG 24 hr tablet, TAKE 1 TABLET BY MOUTH ONCE DAILY TAKE  WITH  OR  IMMEDIATELY  FOLLOWING  A  MEAL, Disp: 90 tablet, Rfl: 0 .  Multiple Vitamin (MULTIVITAMIN WITH MINERALS) TABS tablet, Take 1 tablet by mouth daily., Disp: , Rfl:  .  OVER THE COUNTER MEDICATION, Take 2 tablets by mouth daily. Neu Remedy, Disp: , Rfl:  .  pantoprazole (PROTONIX) 40 MG tablet, Take 1 tablet by mouth once daily, Disp: 90 tablet, Rfl: 0 .  rosuvastatin (CRESTOR) 10 MG tablet, Take 1 tablet (10 mg total) by mouth daily. (Patient taking differently: Take 10 mg by mouth every other day. ), Disp: 30 tablet, Rfl: 4 .  HYDROcodone-acetaminophen (NORCO/VICODIN) 5-325 MG tablet, Take 1-2 tablets by mouth every 6 (six) hours as needed for moderate pain. (Patient not taking: Reported on 05/23/2019), Disp: 40 tablet, Rfl: 0 .  metoCLOPramide (REGLAN) 5 MG tablet, Take 1 tablet (5 mg total) by mouth 3 (three) times daily with meals as needed for nausea. (Patient not taking: Reported on 05/23/2019), Disp: 60 tablet, Rfl: 1 .  promethazine (PHENERGAN) 12.5 MG tablet, 1-2 tabs po q6h prn nausea (Patient not taking: Reported on 05/23/2019), Disp: 30 tablet, Rfl: 1  EXAM:  VITALS per patient if applicable: BP [id number] (BP Location: Left Arm, Patient Position: Sitting, Cuff Size: Normal)   Temp 98 F (36.7 C) (Temporal)   Wt 162 lb 9.6 oz (73.8 kg)   BMI 28.80 kg/m    GENERAL: alert, oriented, appears well and in no acute distress  HEENT: atraumatic, conjunttiva clear, no obvious abnormalities on inspection of external nose and ears  NECK: normal movements of the head and neck  LUNGS: on inspection no signs of respiratory distress, breathing rate appears normal, no obvious gross SOB, gasping or wheezing  CV: no obvious cyanosis  MS: moves all visible extremities without noticeable abnormality  PSYCH/NEURO: pleasant and cooperative, no obvious depression or anxiety, speech and thought processing grossly  intact  LABS: none today    Chemistry      Component Value Date/Time   NA 139 01/29/2019 1102   K 4.6 01/29/2019 1102   CL 104 01/29/2019 1102   CO2 26 01/29/2019 1102   BUN 14 01/29/2019 1102   CREATININE 0.59 01/29/2019 1102      Component Value Date/Time   CALCIUM 9.3 01/29/2019 1102   ALKPHOS 75 03/17/2019 1454   AST 27 03/17/2019 1454   ALT 41 (H) 03/17/2019  1454   BILITOT 0.2 03/17/2019 1454     Lab Results  Component Value Date   HGBA1C 8.3 (H) 01/29/2019   Lab Results  Component Value Date   CHOL 99 01/29/2019   HDL 33 (L) 01/29/2019   LDLCALC 39 01/29/2019   LDLDIRECT 77.0 07/19/2016   TRIG 196 (H) 01/29/2019   CHOLHDL 3.0 01/29/2019   Lab Results  Component Value Date   TSH 2.33 01/29/2019   Lab Results  Component Value Date   IRON 65 01/29/2019   TIBC 472 (H) 01/29/2019   FERRITIN 9 (L) 01/29/2019   ASSESSMENT AND PLAN:  Discussed the following assessment and plan:  1) DM 2, poor control, with DPN and gastroparesis. Improved glucoses of late since she is more active. Eye MD is doing excellent job with her. A1c and urine microalb/cr--future. Lytes/cr future. I rx'd her metformin 1000 mg bid to take until her janumet comes in. She said she would consider insulin if A1c rising.  2) HTN: controlled on losartan and toprol.  No changes. BMET-future.  3) HLD: tolerating statin every OTHER day. Lipids fine except mild elev trigs 01/2019. Hepatic panel future.  Rpeat lipids 3-6 mo.  4) IDA: suspect due to menorrhagia.  EGD/colonoscopy 2020 w/out any culprit identified. Referred her to gynecologist for further eval. Start FeSO4 325mg  qd. CBC with iron panel--future.  I discussed the assessment and treatment plan with the patient. The patient was provided an opportunity to ask questions and all were answered. The patient agreed with the plan and demonstrated an understanding of the instructions.   The patient was advised to call back or seek an  in-person evaluation if the symptoms worsen or if the condition fails to improve as anticipated.  F/u: 4 mo CPE  Signed:  Crissie Sickles, MD           05/23/2019

## 2019-05-23 NOTE — Telephone Encounter (Signed)
Copy of the form made for pt's chart. Original will go out to be mailed. Left message for pt to call with update

## 2019-05-23 NOTE — Telephone Encounter (Signed)
Patient advised form was completed and will be mailed out today.

## 2019-05-26 ENCOUNTER — Other Ambulatory Visit: Payer: Self-pay | Admitting: Family Medicine

## 2019-05-28 ENCOUNTER — Ambulatory Visit (INDEPENDENT_AMBULATORY_CARE_PROVIDER_SITE_OTHER): Payer: BC Managed Care – PPO | Admitting: Family Medicine

## 2019-05-28 ENCOUNTER — Other Ambulatory Visit: Payer: Self-pay

## 2019-05-28 DIAGNOSIS — K760 Fatty (change of) liver, not elsewhere classified: Secondary | ICD-10-CM | POA: Diagnosis not present

## 2019-05-28 DIAGNOSIS — E1165 Type 2 diabetes mellitus with hyperglycemia: Secondary | ICD-10-CM

## 2019-05-28 DIAGNOSIS — D500 Iron deficiency anemia secondary to blood loss (chronic): Secondary | ICD-10-CM

## 2019-05-28 DIAGNOSIS — IMO0002 Reserved for concepts with insufficient information to code with codable children: Secondary | ICD-10-CM

## 2019-05-28 DIAGNOSIS — R7401 Elevation of levels of liver transaminase levels: Secondary | ICD-10-CM | POA: Diagnosis not present

## 2019-05-28 DIAGNOSIS — I10 Essential (primary) hypertension: Secondary | ICD-10-CM

## 2019-05-28 DIAGNOSIS — E11319 Type 2 diabetes mellitus with unspecified diabetic retinopathy without macular edema: Secondary | ICD-10-CM | POA: Diagnosis not present

## 2019-05-29 LAB — COMPREHENSIVE METABOLIC PANEL
ALT: 50 U/L — ABNORMAL HIGH (ref 0–35)
AST: 34 U/L (ref 0–37)
Albumin: 4.3 g/dL (ref 3.5–5.2)
Alkaline Phosphatase: 65 U/L (ref 39–117)
BUN: 11 mg/dL (ref 6–23)
CO2: 27 mEq/L (ref 19–32)
Calcium: 9.6 mg/dL (ref 8.4–10.5)
Chloride: 97 mEq/L (ref 96–112)
Creatinine, Ser: 0.57 mg/dL (ref 0.40–1.20)
GFR: 112.04 mL/min (ref >60.00)
Glucose, Bld: 129 mg/dL — ABNORMAL HIGH (ref 70–99)
Potassium: 4.2 mEq/L (ref 3.5–5.1)
Sodium: 136 mEq/L (ref 135–145)
Total Bilirubin: 0.2 mg/dL (ref 0.2–1.2)
Total Protein: 6.7 g/dL (ref 6.0–8.3)

## 2019-05-29 LAB — MICROALBUMIN / CREATININE URINE RATIO
Creatinine,U: 49.5 mg/dL
Microalb Creat Ratio: 71.9 mg/g — ABNORMAL HIGH (ref 0.0–30.0)
Microalb, Ur: 35.6 mg/dL — ABNORMAL HIGH (ref 0.0–1.9)

## 2019-05-29 LAB — HEMOGLOBIN A1C: Hgb A1c MFr Bld: 8.3 % — ABNORMAL HIGH (ref 4.6–6.5)

## 2019-05-29 LAB — CBC WITH DIFFERENTIAL/PLATELET
Basophils Absolute: 0.1 10*3/uL (ref 0.0–0.1)
Basophils Relative: 1 % (ref 0.0–3.0)
Eosinophils Absolute: 1.8 10*3/uL — ABNORMAL HIGH (ref 0.0–0.7)
Eosinophils Relative: 13.2 % — ABNORMAL HIGH (ref 0.0–5.0)
HCT: 40.4 % (ref 36.0–46.0)
Hemoglobin: 13.1 g/dL (ref 12.0–15.0)
Lymphocytes Relative: 20.1 % (ref 12.0–46.0)
Lymphs Abs: 2.8 10*3/uL (ref 0.7–4.0)
MCHC: 32.3 g/dL (ref 30.0–36.0)
MCV: 84.6 fl (ref 78.0–100.0)
Monocytes Absolute: 0.6 10*3/uL (ref 0.1–1.0)
Monocytes Relative: 4 % (ref 3.0–12.0)
Neutro Abs: 8.6 10*3/uL — ABNORMAL HIGH (ref 1.4–7.7)
Neutrophils Relative %: 61.7 % (ref 43.0–77.0)
Platelets: 473 10*3/uL — ABNORMAL HIGH (ref 150.0–400.0)
RBC: 4.78 Mil/uL (ref 3.87–5.11)
RDW: 16.5 % — ABNORMAL HIGH (ref 11.5–15.5)
WBC: 13.9 10*3/uL — ABNORMAL HIGH (ref 4.0–10.5)

## 2019-05-29 LAB — IRON,TIBC AND FERRITIN PANEL
%SAT: 17 % (calc) (ref 16–45)
Ferritin: 22 ng/mL (ref 16–232)
Iron: 70 ug/dL (ref 45–160)
TIBC: 410 mcg/dL (calc) (ref 250–450)

## 2019-06-02 ENCOUNTER — Encounter: Payer: Self-pay | Admitting: Family Medicine

## 2019-06-11 ENCOUNTER — Other Ambulatory Visit: Payer: Self-pay | Admitting: Family Medicine

## 2019-06-17 ENCOUNTER — Other Ambulatory Visit: Payer: Self-pay

## 2019-06-17 ENCOUNTER — Encounter (INDEPENDENT_AMBULATORY_CARE_PROVIDER_SITE_OTHER): Payer: Self-pay | Admitting: Ophthalmology

## 2019-06-17 ENCOUNTER — Ambulatory Visit (INDEPENDENT_AMBULATORY_CARE_PROVIDER_SITE_OTHER): Payer: BC Managed Care – PPO | Admitting: Ophthalmology

## 2019-06-17 DIAGNOSIS — H4312 Vitreous hemorrhage, left eye: Secondary | ICD-10-CM | POA: Diagnosis not present

## 2019-06-17 DIAGNOSIS — H3581 Retinal edema: Secondary | ICD-10-CM

## 2019-06-17 DIAGNOSIS — E113513 Type 2 diabetes mellitus with proliferative diabetic retinopathy with macular edema, bilateral: Secondary | ICD-10-CM

## 2019-06-17 DIAGNOSIS — I10 Essential (primary) hypertension: Secondary | ICD-10-CM

## 2019-06-17 DIAGNOSIS — H4311 Vitreous hemorrhage, right eye: Secondary | ICD-10-CM

## 2019-06-17 DIAGNOSIS — Z961 Presence of intraocular lens: Secondary | ICD-10-CM

## 2019-06-17 DIAGNOSIS — H25812 Combined forms of age-related cataract, left eye: Secondary | ICD-10-CM

## 2019-06-17 DIAGNOSIS — H44001 Unspecified purulent endophthalmitis, right eye: Secondary | ICD-10-CM

## 2019-06-17 DIAGNOSIS — H25813 Combined forms of age-related cataract, bilateral: Secondary | ICD-10-CM

## 2019-06-17 DIAGNOSIS — H35033 Hypertensive retinopathy, bilateral: Secondary | ICD-10-CM

## 2019-06-17 MED ORDER — BEVACIZUMAB CHEMO INJECTION 1.25MG/0.05ML SYRINGE FOR KALEIDOSCOPE
1.2500 mg | INTRAVITREAL | Status: AC | PRN
  Administered 2019-06-17: 1.25 mg via INTRAVITREAL

## 2019-06-17 NOTE — Progress Notes (Signed)
[facility] Note  06/17/2019     CHIEF COMPLAINT Patient presents for Retina Follow Up   HISTORY OF PRESENT ILLNESS: Gloria Gomez is a 49 y.o. female who presents to the clinic today for:   HPI    Retina Follow Up    Patient presents with  Diabetic Retinopathy.  In both eyes.  This started 4 weeks ago.  Severity is moderate.  I, the attending physician,  performed the HPI with the patient and updated documentation appropriately.          Comments    Patient here for 4 weeks retina follow up for PDR OU. Patient states vision doing ok. No eye pain. Forgot glasses today.       Last edited by Bernarda Caffey, MD on 06/17/2019  3:53 PM. (History)    pt did not bring her glasses today, she states not much change in vision, she states she had a 8-9lb fluid weight gain last week, which made her left eye vision hazier than normal   Referring physician:   HISTORICAL INFORMATION:   Selected notes from the MEDICAL RECORD NUMBER Referred for DM exam    CURRENT MEDICATIONS: No current outpatient medications on file. (Ophthalmic Drugs)   No current facility-administered medications for this visit. (Ophthalmic Drugs)   Current Outpatient Medications (Other)  Medication Sig  . glipiZIDE (GLUCOTROL XL) 10 MG 24 hr tablet Take 1 tablet (10 mg total) by mouth daily with breakfast.  . HYDROcodone-acetaminophen (NORCO/VICODIN) 5-325 MG tablet Take 1-2 tablets by mouth every 6 (six) hours as needed for moderate pain. (Patient not taking: Reported on 05/23/2019)  . INVOKANA 300 MG TABS tablet TAKE 1 TABLET BY MOUTH ONCE DAILY BEFORE BREAKFAST  . JANUMET 50-1000 MG tablet TAKE 1 TABLET BY MOUTH TWICE DAILY WITH A MEAL  . liraglutide (VICTOZA) 18 MG/3ML SOPN Inject 0.2 mLs (1.2 mg total) into the skin daily. Inject 0.6mg  SQ daily for 1 week, then increase to 1.2mg  SQ daily.  Marland Kitchen losartan (COZAAR) 100 MG tablet Take 1 tablet by mouth once daily  . metFORMIN  (GLUCOPHAGE) 1000 MG tablet Take 1 tablet (1,000 mg total) by mouth 2 (two) times daily with a meal.  . metoCLOPramide (REGLAN) 5 MG tablet Take 1 tablet (5 mg total) by mouth 3 (three) times daily with meals as needed for nausea. (Patient not taking: Reported on 05/23/2019)  . metoprolol succinate (TOPROL-XL) 100 MG 24 hr tablet TAKE 1 TABLET BY MOUTH ONCE DAILY TAKE  WITH  OR  IMMEDIATELY  FOLLOWING  A  MEAL  . Multiple Vitamin (MULTIVITAMIN WITH MINERALS) TABS tablet Take 1 tablet by mouth daily.  Marland Kitchen OVER THE COUNTER MEDICATION Take 2 tablets by mouth daily. Neu Remedy  . pantoprazole (PROTONIX) 40 MG tablet Take 1 tablet by mouth once daily  . promethazine (PHENERGAN) 12.5 MG tablet 1-2 tabs po q6h prn nausea (Patient not taking: Reported on 05/23/2019)  . rosuvastatin (CRESTOR) 10 MG tablet Take 1 tablet (10 mg total) by mouth daily. (Patient taking differently: Take 10 mg by mouth every other day. )   No current facility-administered medications for this visit. (Other)      REVIEW OF SYSTEMS: ROS    Positive for: Endocrine, Eyes   Negative for: Constitutional, Gastrointestinal, Neurological, Skin, Genitourinary, Musculoskeletal, HENT, Cardiovascular, Respiratory, Psychiatric, Allergic/Imm, Heme/Lymph   Last edited by Theodore Demark, COA on 06/17/2019  2:57 PM. (History)       ALLERGIES Allergies  Allergen  Reactions  . Gluten Meal Swelling  . Lisinopril Cough  . Pioglitazone Other (See Comments)    ELEVATED glucoses + worse chronic nausea    PAST MEDICAL HISTORY Past Medical History:  Diagnosis Date  . Abdominal bloating    likely from diab gastroparesis.  Dr. Havery Moros started trial of reglan 03/2019.  Marland Kitchen Benign brain tumor ([place])    Cystic lesion in cerebral aqueduct region with mild hydrocephalus-- stable MRI 02/2016.  Surveillance MRI 05/2017 --dilated cerebral aqueduct related to aqueductal stenosis and subsequent mild hydrocephalus (due to the 11 mm stable cystic lesion in  cerebral aqueduct---?congenitial?.  . Cataract    OU  . Diabetic retinopathy ([place])   . Dysmenorrhea    vicodin occ during first 2 days of cycle.  . Gluten intolerance    pt reports she underwent full GI w/u to r/o celiac dz  . Hepatic steatosis    ultrasound 08/2017  . History of adenomatous polyp of colon 04/08/2019   recall Feb 2024  . Hyperlipidemia, mixed   . Hypertensive retinopathy of both eyes   . Insomnia   . Iron deficiency 01/2019   Hb 11.3. Hemoccults neg x 3 03/19/19. EGD and colonoscopy 04/08/19 showed NO cause for IDA.  Pt does have menorrhagia, though, so she'll see her GYN.  started FeSO4 325 qd approx 04/14/19.  Marland Kitchen Proliferative diabetic retinopathy of both eyes ([place])    steroid injections 10/2017--improved  . Sensorineural hearing loss of left ear    Sudden left hearing loss summer 2016--no improvement with steroids 01/2015 so brain MRI done by Dr. Redmond Baseman and it showed brain tumor that was determined to be benign.  Pt's hearing not bad enough for hearing aid as of 06/2016.  . Type 2 diabetes with complication (HCC)    +microalbuminuria, diab retpthy, diabetic gastroparesis (gastric emptying study mildly abnl 03/2017).  Recommended lantus 08/2018 but pt declined. Mild microalbuminuria.  . White coat hypertension    Past Surgical History:  Procedure Laterality Date  . ANOSCOPY  05/12/2019   Procedure: normal exam, minimal hemorrhoid disease. Hyertrophied anal papila, benign appearing, posterior midline. Surgeon: Leighton Ruff MD  . CHOLECYSTECTOMY  2000  . COLONOSCOPY  04/08/2019   5 adenomas, recall 3 yrs; no cause for IDA found.  Hypertrophied anal papillae->bx showed low grade dysplasia; GI referred her to colorectal surgeon.  . ESOPHAGOGASTRODUODENOSCOPY  04/08/2019   mild chronic reactive gastritis. H pylori NEG.  No cause for IDA found.  . GAS INSERTION Right 10/31/2018   Procedure: Insertion Of C3F8 Gas;  Surgeon: Bernarda Caffey, MD;  Location: [place];  Service:  Ophthalmology;  Laterality: Right;  . GASTRIC EMPTYING SCAN  04/20/2017   Mildly abnormal, particularly the 1st hour of emptying.  Marland Kitchen MEMBRANE PEEL Right 10/31/2018   Procedure: MEMBRANE PEEL;  Surgeon: Bernarda Caffey, MD;  Location: [place];  Service: Ophthalmology;  Laterality: Right;  . PARS PLANA VITRECTOMY Right 04/12/2018   Procedure: Right PARS PLANA VITRECTOMY WITH 25 GAUGE with intravitreal antibiotics;  Surgeon: Bernarda Caffey, MD;  Location: [place];  Service: Ophthalmology;  Laterality: Right;  . PARS PLANA VITRECTOMY Right 10/31/2018   Procedure: PARS PLANA VITRECTOMY WITH 25 GAUGE;  Surgeon: Bernarda Caffey, MD;  Location: [place];  Service: Ophthalmology;  Laterality: Right;  . PHOTOCOAGULATION WITH LASER Right 10/31/2018   Procedure: Photocoagulation With Laser;  Surgeon: Bernarda Caffey, MD;  Location: [place];  Service: Ophthalmology;  Laterality: Right;    FAMILY HISTORY Family History  Problem Relation Age of  Onset  . Brain cancer Mother   . Diabetes Father   . Diabetes Maternal Grandmother   . Cataracts Maternal Grandmother   . Cervical cancer Paternal Grandmother   . Colon cancer Maternal Grandfather 30  . Amblyopia Neg Hx   . Blindness Neg Hx   . Glaucoma Neg Hx   . Macular degeneration Neg Hx   . Retinal detachment Neg Hx   . Strabismus Neg Hx   . Retinitis pigmentosa Neg Hx   . Esophageal cancer Neg Hx   . Stomach cancer Neg Hx   . Rectal cancer Neg Hx     SOCIAL HISTORY Social History   Tobacco Use  . Smoking status: Never Smoker  . Smokeless tobacco: Never Used  Substance Use Topics  . Alcohol use: No  . Drug use: No         OPHTHALMIC EXAM:  Base Eye Exam    Visual Acuity (Snellen - Linear)      Right Left   Dist [place] 20/20 -1 20/200   Dist ph [place]  20/40  Patient forgot glasses.       Tonometry (Tonopen, 2:53 PM)      Right Left   Pressure 16 14       Pupils      Dark Light Shape React APD   Right 3 2 Round Brisk None   Left 3 2 Round Brisk None        Visual Fields (Counting fingers)      Left Right    Full Full       Extraocular Movement      Right Left    Full, Ortho Full, Ortho       Neuro/Psych    Oriented x3: Yes   Mood/Affect: Normal       Dilation    Both eyes: 1.0% Mydriacyl, 2.5% Phenylephrine @ 2:52 PM        Slit Lamp and Fundus Exam    Slit Lamp Exam      Right Left   Lids/Lashes Dermatochalasis - upper lid, mild Meibomian gland dysfunction, Telangiectasia Dermatochalasis - upper lid, Meibomian gland dysfunction, Telangiectasia   Conjunctiva/Sclera White and quiet, sutures intact White and quiet   Cornea Clear, trace Punctate epithelial erosions, well healed temporal cataract wounds Trace Punctate epithelial erosions   Anterior Chamber Deep and quiet Deep and quiet   Iris Round and dilated, No NVI Round and dilated, No NVI   Lens PC IOL in good position, 1-2+ Posterior capsular opacification, PC folds 2+ Nuclear sclerosis, 2+ Cortical cataract   Vitreous post vitrectomy, trace pigment Vitreous syneresis, +RBC in anterior vitreous improving, VH clearing and settling inferiorly, vitreous condensations, residual blood clots settling inferiorly and clearing       Fundus Exam      Right Left   Disc mild Pallor, Sharp rim Pink and sharp, Compact   C/D Ratio 0.2 0.1   Macula Flat, blunted foveal reflex, central cystic changes slightly improved, scattered MA/exudate, cluster of MA and exudates greatest ST to fovea -- persistent Flat, blunted foveal reflex, scattered MA, scattered cystic changes, scattered punctate exudates - improving, trace ERM nasally    Vessels mild Vascular attenuation, Tortuous Vascular attenuation, mild Tortuousity   Periphery Attached, tractional fibrosis in SN quadrant removed, scattered DBH greatest superiorly, 360 PRP, good laser fill in 360 attached, 360 PRP scars room for posterior fill in, inferior retina obscured by settling VH        Refraction  Wearing Rx      Sphere  Cylinder   Right -3.75 Sphere   Left -3.75 Sphere          IMAGING AND PROCEDURES  Imaging and Procedures for @TODAY @  OCT, Retina - OU - Both Eyes       Right Eye Quality was good. Central Foveal Thickness: 367. Progression has improved. Findings include abnormal foveal contour, epiretinal membrane, no SRF, intraretinal hyper-reflective material, vitreous traction, preretinal fibrosis (interval improvement in IRF).   Left Eye Quality was good. Central Foveal Thickness: 286. Progression has improved. Findings include normal foveal contour, intraretinal fluid, no SRF (Mild interval improvement in IRF temporal macula; Mild ERM ).   Notes *Images captured and stored on drive  Diagnosis / Impression:  DME OU OD: interval improvement in IRF OS: Mild interval improvement in IRF temporal macula; Mild ERM    Clinical management:  See below  Abbreviations: NFP - Normal foveal profile. CME - cystoid macular edema. PED - pigment epithelial detachment. IRF - intraretinal fluid. SRF - subretinal fluid. EZ - ellipsoid zone. ERM - epiretinal membrane. ORA - outer retinal atrophy. ORT - outer retinal tubulation. SRHM - subretinal hyper-reflective material         Intravitreal Injection, Pharmacologic Agent - OD - Right Eye       Time Out 06/17/2019. 3:05 PM. Confirmed correct patient, procedure, site, and patient consented.   Anesthesia Topical anesthesia was used. Anesthetic medications included Lidocaine 2%, Proparacaine 0.5%.   Procedure Preparation included 5% betadine to ocular surface, eyelid speculum. A supplied needle was used.   Injection:  1.25 mg Bevacizumab (AVASTIN) SOLN   NDC: [id number], Lot: [id number] , Expiration date: 07/31/2019   Route: Intravitreal, Site: Right Eye, Waste: 0 mL  Post-op Post injection exam found visual acuity of at least counting fingers. The patient tolerated the procedure well. There were no complications. The patient received written  and verbal post procedure care education.        Intravitreal Injection, Pharmacologic Agent - OS - Left Eye       Time Out 06/17/2019. 3:10 PM. Confirmed correct patient, procedure, site, and patient consented.   Anesthesia Topical anesthesia was used. Anesthetic medications included Lidocaine 2%, Proparacaine 0.5%.   Procedure Preparation included 5% betadine to ocular surface, eyelid speculum. A supplied needle was used.   Injection:  1.25 mg Bevacizumab (AVASTIN) SOLN   NDC: 06/30/2019, Lot: [id number] , Expiration date: 09/23/2019   Route: Intravitreal, Site: Left Eye, Waste: 0 mL  Post-op Post injection exam found visual acuity of at least counting fingers. The patient tolerated the procedure well. There were no complications. The patient received written and verbal post procedure care education.                ASSESSMENT/PLAN:    ICD-10-CM   1. Proliferative diabetic retinopathy of both eyes with macular edema associated with type 2 diabetes mellitus (HCC)  [id number] Intravitreal Injection, Pharmacologic Agent - OD - Right Eye    Intravitreal Injection, Pharmacologic Agent - OS - Left Eye    Bevacizumab (AVASTIN) SOLN 1.25 mg    Bevacizumab (AVASTIN) SOLN 1.25 mg  2. Retinal edema  H35.81 OCT, Retina - OU - Both Eyes  3. Vitreous hemorrhage of left eye (HCC)  H43.12   4. Right endophthalmia  H44.001   5. Vitreous hemorrhage, right eye ([place])  H43.11   6. Essential hypertension  I10   7. Hypertensive retinopathy of both eyes  H35.033   8. Combined forms  of age-related cataract of left eye  H25.812   9. Pseudophakia of right eye  Z96.1   10. Combined forms of age-related cataract of both eyes  H25.813   11. Vitreous hemorrhage of right eye (HCC)  H43.11     1,2.  Proliferative diabetic retinopathy w/ DME, OU  - HbA1c 8.3% (12.2.20), 8.6% (06.30.20), 8.4% (02.14.20)  - s/p IVA OD #1 9.20.19, #2 (10.25.19), #3 (11.15.19), #4 (12.17.19), #5 (01.14.20), #6  (2.11.20), #7 (05.29.20), #8 (08.19.20), #9 (10.30.20), #10 (12.09.20), #11 (01.11.21), #12 (02.15.21), #13 (03.23.21)  - s/p IVA OS #1 9.27.19, #2 (10.25.19), #3 (11.15.19), #4 (12.17.19), #5 (01.14.20), #6 (2.11.20), #7 (04.26.20), #8 (05.29.20), #9 (06.26.20), #10 (08.05.20), #11 (11.11.20), #12 (12.09.20), #13 (01.11.21), #14 (02.15.21), #15 (03.23.21)  - S/P PRP OS (09.20.19), (5.19.20), (08.19.20)  - S/P PRP OD (9.27.19 and 11.21.19), fill-in (04.14.20) (09.03.20, surgery)  - FA (9.20.19) shows +NVE OU and leaking MA and capillary nonperfusion  - repeat FA 11.15.19 shows NV regressing OU  - pre-op: OD w/ VA stable at 20/25, but there is some preretinal fibrosis / tractional membranes just superior to disc and mild central DME  - s/p 25g PPV+MP+10% C3F8 gas OD (09.03.20) -- ERM/PRF removal OD  - BCVA 20/20 OD             - fibrosis/ERM stably improved; retina attached  - OCT shows interval improvement in IRF OU  - recommend IVA OU (OD #14 and OS #16) today, 4.20.21  - pt wishes to proceed with IVA OU  - RBA of procedure discussed, questions answered  - informed consent obtained   - see procedure note  - Avastin informed consent form signed and scanned on 01.11.2021 (OU)  - f/u 1 week -- fill in PRP OS  3. Vitreous hemorrhage OS -- improving  - recurrent VH, onset 11.11.20  - initial onset 04.24.20  - etiology: secondary to PDR as described above (no RT/RD on exam)  - s/p PRP OS (9.20.19), (05.19.20), (08.19.20)  - s/p IVA OS on 4.26.20, 5.29.20, 6.26.20, 8.5.20, and 11.11.20, 12.06.20  - today, further improvement in diffuse VH post-IVA OS -- pt reports improvement in floaters  - BCVA 20/40 today, but pt did not bring glasses  - recommend IVA OS #15 as above  - may benefit from further PRP fill in OS for VH prophylaxis  - f/u 1 week -- PRP fill in OS  4. History of Endophthalmitis OD  - s/p IVA OU 04/09/2018  - s/p 25g PPV w/ intravitreal vanc, ceftaz and cefepime OD,  2.14.2020  - s/p intravitreal tap / vanc and ceflaz injections (02.16.20)             - gram stain (2.14.20) shows G+ cocci, WBCs mostly PMNs;   - repeat gram stain from t/i (2.16.20) -- no organisms, just WBCs             - cultures from vitreous grew rare Staph warneri; cultures from t/i -- no growth             - doing well, BCVA 20/30             - inflammation/posterior debris resolved             - IOP 16 off Brimonidine  - completed po pred taper -- caused significant elevations in BG  - monitor  5. History of Vitreous Hemorrhage OD -- cleared from PPV x2 for endophthalmitis and ERM/preretinal fibrosis  - secondary to PDR  as described below  - S/P IVA OD #1 (09.20.19), #2 (10.25.19), #3 (11.15.19), #4 (12.16.19), #5 (03/12/2018)  - S/P PRP OD #1 (09.27.19), fill in OD (11.21.19) -- each somewhat limited inferiorly by residual VH  6,7. Hypertensive retinopathy OU  - discussed importance of tight BP control.  - monitor for now  8. Combined form age related cataract OS-   - The symptoms of cataract, surgical options, and treatments and risks were discussed with patient.  - discussed diagnosis and progression  9. Pseudophakia OD  - s/p CE/IOL OD (Dr. Zenia Resides, 12.11.20)  - beautiful surgery, doing well   Ophthalmic Meds Ordered this visit:  Meds ordered this encounter  Medications  . Bevacizumab (AVASTIN) SOLN 1.25 mg  . Bevacizumab (AVASTIN) SOLN 1.25 mg       Return in about 1 week (around 06/24/2019) for f/u PDR OU, DFE, OCT, Laser PRP fill in OS.  There are no Patient Instructions on file for this visit.   Explained the diagnoses, plan, and follow up with the patient and they expressed understanding.  Patient expressed understanding of the importance of proper follow up care.   This document serves as a record of services personally performed by Gardiner Sleeper, MD, PhD. It was created on their behalf by Estill Bakes, COT an ophthalmic technician. The creation of  this record is the provider's dictation and/or activities during the visit.    Electronically signed by: Estill Bakes, COT 06/17/19 @ 10:22 PM  Gardiner Sleeper, M.D., Ph.D. Diseases & Surgery of the Retina and [place] 06/17/2019   I have reviewed the above documentation for accuracy and completeness, and I agree with the above. Gardiner Sleeper, M.D., Ph.D. 06/17/19 10:22 PM    Abbreviations: M myopia (nearsighted); A astigmatism; H hyperopia (farsighted); P presbyopia; Mrx spectacle prescription;  CTL contact lenses; OD right eye; OS left eye; OU both eyes  XT exotropia; ET esotropia; PEK punctate epithelial keratitis; PEE punctate epithelial erosions; DES dry eye syndrome; MGD meibomian gland dysfunction; ATs artificial tears; PFAT's preservative free artificial tears; [place] nuclear sclerotic cataract; PSC posterior subcapsular cataract; ERM epi-retinal membrane; PVD posterior vitreous detachment; RD retinal detachment; DM diabetes mellitus; DR diabetic retinopathy; NPDR non-proliferative diabetic retinopathy; PDR proliferative diabetic retinopathy; CSME clinically significant macular edema; DME diabetic macular edema; dbh dot blot hemorrhages; CWS cotton wool spot; POAG primary open angle glaucoma; C/D cup-to-disc ratio; HVF humphrey visual field; GVF goldmann visual field; OCT optical coherence tomography; IOP intraocular pressure; BRVO Branch retinal vein occlusion; CRVO central retinal vein occlusion; CRAO central retinal artery occlusion; BRAO branch retinal artery occlusion; RT retinal tear; SB scleral buckle; PPV pars plana vitrectomy; VH Vitreous hemorrhage; PRP panretinal laser photocoagulation; IVK intravitreal kenalog; VMT vitreomacular traction; MH Macular hole;  NVD neovascularization of the disc; NVE neovascularization elsewhere; AREDS age related eye disease study; ARMD age related macular degeneration; POAG primary open angle glaucoma; EBMD  epithelial/anterior basement membrane dystrophy; ACIOL anterior chamber intraocular lens; IOL intraocular lens; PCIOL posterior chamber intraocular lens; Phaco/IOL phacoemulsification with intraocular lens placement; [place] photorefractive keratectomy; LASIK laser assisted in situ keratomileusis; HTN hypertension; DM diabetes mellitus; COPD chronic obstructive pulmonary disease

## 2019-06-23 DIAGNOSIS — D509 Iron deficiency anemia, unspecified: Secondary | ICD-10-CM | POA: Diagnosis not present

## 2019-06-23 DIAGNOSIS — Z01419 Encounter for gynecological examination (general) (routine) without abnormal findings: Secondary | ICD-10-CM | POA: Diagnosis not present

## 2019-06-23 DIAGNOSIS — N920 Excessive and frequent menstruation with regular cycle: Secondary | ICD-10-CM | POA: Diagnosis not present

## 2019-06-23 DIAGNOSIS — Z6827 Body mass index (BMI) 27.0-27.9, adult: Secondary | ICD-10-CM | POA: Diagnosis not present

## 2019-06-23 LAB — CBC AND DIFFERENTIAL
HCT: 41 (ref 36–46)
Hemoglobin: 12.8 (ref 12.0–16.0)
Hemoglobin: 12.8 (ref 12.0–16.0)
Hemoglobin: 13.6 (ref 12.0–16.0)
Neutrophils Absolute: 6
Platelets: 580 — AB (ref 150–399)
WBC: 11

## 2019-06-23 LAB — TSH: TSH: 1.37 (ref 0.41–5.90)

## 2019-06-23 LAB — HM PAP SMEAR: HM Pap smear: NORMAL

## 2019-06-23 LAB — CBC: RBC: 4.92 (ref 3.87–5.11)

## 2019-06-23 LAB — RESULTS CONSOLE HPV: CHL HPV: NEGATIVE

## 2019-06-24 ENCOUNTER — Other Ambulatory Visit: Payer: Self-pay | Admitting: Family Medicine

## 2019-06-24 NOTE — Progress Notes (Signed)
[facility] Note  06/25/2019     CHIEF COMPLAINT Patient presents for Retina Follow Up   HISTORY OF PRESENT ILLNESS: Gloria Gomez is a 49 y.o. female who presents to the clinic today for:   HPI    Retina Follow Up    Patient presents with  Diabetic Retinopathy.  In both eyes.  This started years ago.  Severity is moderate.  Duration of 1 week.  Since onset it is stable.  I, the attending physician,  performed the HPI with the patient and updated documentation appropriately.          Comments    49 y/o female pt here for 1 wk f/u for PDR OU.  Here for PRP fill-in OS today.  No change in [place] OU.  Denies pain, FOL, floaters.  No gtts.  BS 139 this a.m.  A1C 8.0.       Last edited by Bernarda Caffey, MD on 06/27/2019 12:34 AM. (History)    pt here for PRP OS   Referring physician:   HISTORICAL INFORMATION:   Selected notes from the MEDICAL RECORD NUMBER Referred for DM exam    CURRENT MEDICATIONS: No current outpatient medications on file. (Ophthalmic Drugs)   No current facility-administered medications for this visit. (Ophthalmic Drugs)   Current Outpatient Medications (Other)  Medication Sig  . glipiZIDE (GLUCOTROL XL) 10 MG 24 hr tablet Take 1 tablet (10 mg total) by mouth daily with breakfast.  . HYDROcodone-acetaminophen (NORCO/VICODIN) 5-325 MG tablet Take 1-2 tablets by mouth every 6 (six) hours as needed for moderate pain. (Patient not taking: Reported on 05/23/2019)  . INVOKANA 300 MG TABS tablet TAKE 1 TABLET BY MOUTH ONCE DAILY BEFORE BREAKFAST  . JANUMET 50-1000 MG tablet TAKE 1 TABLET BY MOUTH TWICE DAILY WITH A MEAL  . liraglutide (VICTOZA) 18 MG/3ML SOPN Inject 0.2 mLs (1.2 mg total) into the skin daily. Inject 0.6mg  SQ daily for 1 week, then increase to 1.2mg  SQ daily.  Marland Kitchen losartan (COZAAR) 100 MG tablet Take 1 tablet by mouth once daily  . metFORMIN (GLUCOPHAGE) 1000 MG tablet Take 1 tablet (1,000 mg total) by mouth 2 (two)  times daily with a meal.  . metoCLOPramide (REGLAN) 5 MG tablet Take 1 tablet (5 mg total) by mouth 3 (three) times daily with meals as needed for nausea. (Patient not taking: Reported on 05/23/2019)  . metoprolol succinate (TOPROL-XL) 100 MG 24 hr tablet TAKE 1 TABLET BY MOUTH ONCE DAILY TAKE  WITH  OR  IMMEDIATELY  FOLLOWING  A  MEAL  . Multiple Vitamin (MULTIVITAMIN WITH MINERALS) TABS tablet Take 1 tablet by mouth daily.  Marland Kitchen OVER THE COUNTER MEDICATION Take 2 tablets by mouth daily. Neu Remedy  . pantoprazole (PROTONIX) 40 MG tablet Take 1 tablet by mouth once daily  . promethazine (PHENERGAN) 12.5 MG tablet 1-2 tabs po q6h prn nausea (Patient not taking: Reported on 05/23/2019)  . rosuvastatin (CRESTOR) 10 MG tablet Take 1 tablet (10 mg total) by mouth daily. (Patient taking differently: Take 10 mg by mouth every other day. )   No current facility-administered medications for this visit. (Other)      REVIEW OF SYSTEMS: ROS    Positive for: Endocrine, Eyes   Negative for: Constitutional, Gastrointestinal, Neurological, Skin, Genitourinary, Musculoskeletal, HENT, Cardiovascular, Respiratory, Psychiatric, Allergic/Imm, Heme/Lymph   Last edited by Matthew Folks, COA on 06/25/2019 10:17 AM. (History)       ALLERGIES Allergies  Allergen Reactions  .  Gluten Meal Swelling  . Lisinopril Cough  . Pioglitazone Other (See Comments)    ELEVATED glucoses + worse chronic nausea    PAST MEDICAL HISTORY Past Medical History:  Diagnosis Date  . Abdominal bloating    likely from diab gastroparesis.  Dr. Havery Moros started trial of reglan 03/2019.  Marland Kitchen Benign brain tumor ([place])    Cystic lesion in cerebral aqueduct region with mild hydrocephalus-- stable MRI 02/2016.  Surveillance MRI 05/2017 --dilated cerebral aqueduct related to aqueductal stenosis and subsequent mild hydrocephalus (due to the 11 mm stable cystic lesion in cerebral aqueduct---?congenitial?.  . Cataract    OU  . Diabetic  retinopathy ([place])   . Dysmenorrhea    vicodin occ during first 2 days of cycle.  . Gluten intolerance    pt reports she underwent full GI w/u to r/o celiac dz  . Hepatic steatosis    ultrasound 08/2017  . History of adenomatous polyp of colon 04/08/2019   recall Feb 2024  . Hyperlipidemia, mixed   . Hypertensive retinopathy of both eyes   . Insomnia   . Iron deficiency 01/2019   Hb 11.3. Hemoccults neg x 3 03/19/19. EGD and colonoscopy 04/08/19 showed NO cause for IDA.  Pt does have menorrhagia, though, so she'll see her GYN.  started FeSO4 325 qd approx 04/14/19.  Marland Kitchen Proliferative diabetic retinopathy of both eyes ([place])    steroid injections 10/2017--improved  . Sensorineural hearing loss of left ear    Sudden left hearing loss summer 2016--no improvement with steroids 01/2015 so brain MRI done by Dr. Redmond Baseman and it showed brain tumor that was determined to be benign.  Pt's hearing not bad enough for hearing aid as of 06/2016.  . Type 2 diabetes with complication (HCC)    +microalbuminuria, diab retpthy, diabetic gastroparesis (gastric emptying study mildly abnl 03/2017).  Recommended lantus 08/2018 but pt declined. Mild microalbuminuria.  . White coat hypertension    Past Surgical History:  Procedure Laterality Date  . ANOSCOPY  05/12/2019   Procedure: normal exam, minimal hemorrhoid disease. Hyertrophied anal papila, benign appearing, posterior midline. Surgeon: Leighton Ruff MD  . CHOLECYSTECTOMY  2000  . COLONOSCOPY  04/08/2019   5 adenomas, recall 3 yrs; no cause for IDA found.  Hypertrophied anal papillae->bx showed low grade dysplasia; GI referred her to colorectal surgeon.  . ESOPHAGOGASTRODUODENOSCOPY  04/08/2019   mild chronic reactive gastritis. H pylori NEG.  No cause for IDA found.  . GAS INSERTION Right 10/31/2018   Procedure: Insertion Of C3F8 Gas;  Surgeon: Bernarda Caffey, MD;  Location: [facility];  Service: Ophthalmology;  Laterality: Right;  . GASTRIC EMPTYING SCAN  04/20/2017    Mildly abnormal, particularly the 1st hour of emptying.  Marland Kitchen MEMBRANE PEEL Right 10/31/2018   Procedure: MEMBRANE PEEL;  Surgeon: Bernarda Caffey, MD;  Location: [facility];  Service: Ophthalmology;  Laterality: Right;  . PARS PLANA VITRECTOMY Right 04/12/2018   Procedure: Right PARS PLANA VITRECTOMY WITH 25 GAUGE with intravitreal antibiotics;  Surgeon: Bernarda Caffey, MD;  Location: [facility];  Service: Ophthalmology;  Laterality: Right;  . PARS PLANA VITRECTOMY Right 10/31/2018   Procedure: PARS PLANA VITRECTOMY WITH 25 GAUGE;  Surgeon: Bernarda Caffey, MD;  Location: [facility];  Service: Ophthalmology;  Laterality: Right;  . PHOTOCOAGULATION WITH LASER Right 10/31/2018   Procedure: Photocoagulation With Laser;  Surgeon: Bernarda Caffey, MD;  Location: [facility];  Service: Ophthalmology;  Laterality: Right;    FAMILY HISTORY Family History  Problem Relation Age of Onset  .  Brain cancer Mother   . Diabetes Father   . Diabetes Maternal Grandmother   . Cataracts Maternal Grandmother   . Cervical cancer Paternal Grandmother   . Colon cancer Maternal Grandfather 56  . Amblyopia Neg Hx   . Blindness Neg Hx   . Glaucoma Neg Hx   . Macular degeneration Neg Hx   . Retinal detachment Neg Hx   . Strabismus Neg Hx   . Retinitis pigmentosa Neg Hx   . Esophageal cancer Neg Hx   . Stomach cancer Neg Hx   . Rectal cancer Neg Hx     SOCIAL HISTORY Social History   Tobacco Use  . Smoking status: Never Smoker  . Smokeless tobacco: Never Used  Substance Use Topics  . Alcohol use: No  . Drug use: No         OPHTHALMIC EXAM:  Base Eye Exam    Visual Acuity (Snellen - Linear)      Right Left   Dist [place] 20/20 - 20/200 -   Dist ph   20/40 -       Tonometry (Tonopen, 10:20 AM)      Right Left   Pressure 16 14       Pupils      Dark Light Shape React APD   Right 3 2 Round Brisk None   Left 3 2 Round Brisk None       Visual Fields (Counting fingers)      Left Right    Full Full       Extraocular  Movement      Right Left    Full, Ortho Full, Ortho       Neuro/Psych    Oriented x3: Yes   Mood/Affect: Normal       Dilation    Both eyes: 1.0% Mydriacyl, 2.5% Phenylephrine @ 10:20 AM        Slit Lamp and Fundus Exam    Slit Lamp Exam      Right Left   Lids/Lashes Dermatochalasis - upper lid, mild Meibomian gland dysfunction, Telangiectasia Dermatochalasis - upper lid, Meibomian gland dysfunction, Telangiectasia   Conjunctiva/Sclera White and quiet, sutures intact White and quiet   Cornea Clear, trace Punctate epithelial erosions, well healed temporal cataract wounds Trace Punctate epithelial erosions   Anterior Chamber Deep and quiet Deep and quiet   Iris Round and dilated, No NVI Round and dilated, No NVI   Lens PC IOL in good position, 1-2+ Posterior capsular opacification, PC folds 2+ Nuclear sclerosis, 2+ Cortical cataract   Vitreous post vitrectomy, trace pigment Vitreous syneresis, +RBC in anterior vitreous improving, VH clearing and settling inferiorly, vitreous condensations, residual blood clots settling inferiorly and clearing       Fundus Exam      Right Left   Disc mild Pallor, Sharp rim Pink and sharp, Compact   C/D Ratio 0.2 0.1   Macula Flat, blunted foveal reflex, central cystic changes slightly improved, scattered MA/exudate, cluster of MA and exudates greatest ST to fovea -- persistent Flat, blunted foveal reflex, scattered MA, scattered cystic changes, scattered punctate exudates - improving, trace ERM nasally    Vessels mild Vascular attenuation, Tortuous Vascular attenuation, mild Tortuousity   Periphery Attached, tractional fibrosis in SN quadrant removed, scattered DBH greatest superiorly, 360 PRP, good laser fill in 360 attached, 360 PRP scars room for posterior fill in, inferior retina obscured by settling VH          IMAGING AND PROCEDURES  Imaging and  Procedures for @TODAY @  CBC and differential     Component Value Flag Ref Range Units Status    Hemoglobin 12.8      12.0 - 16.0  Final        OCT, Retina - OU - Both Eyes       Right Eye Quality was good. Central Foveal Thickness: 363. Progression has improved. Findings include abnormal foveal contour, epiretinal membrane, no SRF, intraretinal hyper-reflective material, vitreous traction, preretinal fibrosis (Mild interval improvement in IRF).   Left Eye Quality was good. Central Foveal Thickness: 289. Progression has been stable. Findings include normal foveal contour, intraretinal fluid, no SRF (Persistent IRF temporal macula; Mild ERM ).   Notes *Images captured and stored on drive  Diagnosis / Impression:  DME OU OD: Mild interval improvement in IRF OS: Persistent IRF temporal macula; Mild ERM    Clinical management:  See below  Abbreviations: NFP - Normal foveal profile. CME - cystoid macular edema. PED - pigment epithelial detachment. IRF - intraretinal fluid. SRF - subretinal fluid. EZ - ellipsoid zone. ERM - epiretinal membrane. ORA - outer retinal atrophy. ORT - outer retinal tubulation. SRHM - subretinal hyper-reflective material         Panretinal Photocoagulation - OS - Left Eye       Time Out Confirmed correct patient, procedure, site, and patient consented.   Anesthesia Topical anesthesia was used. Anesthetic medications included Tetracaine 0.5%.   Post-op The patient tolerated the procedure well. There were no complications. The patient received written and verbal post procedure care education.   Notes LASER PROCEDURE NOTE  Diagnosis:   Proliferative Diabetic Retinopathy, left EYE  Procedure:  Pan-retinal photocoagulation using slit lamp laser left EYE, fill-in  Anesthesia:  Topical  Surgeon: Bernarda Caffey, MD, PhD   Informed consent obtained, operative eye marked, and time out performed prior to initiation of laser.   [place] [id number] slit lamp laser Pattern: 2x2 square Power: 300 mW Duration: 40 msec  Spot size: 200 microns  #  spots: 681 spots fill-in  Complications: None.   Patient tolerated the procedure well and received written and verbal post-procedure care information/education.                 ASSESSMENT/PLAN:    ICD-10-CM   1. Proliferative diabetic retinopathy of both eyes with macular edema associated with type 2 diabetes mellitus ([place])  [id number] Panretinal Photocoagulation - OS - Left Eye  2. Retinal edema  H35.81 OCT, Retina - OU - Both Eyes  3. Vitreous hemorrhage of left eye (HCC)  H43.12   4. Right endophthalmia  H44.001   5. Vitreous hemorrhage, right eye ([place])  H43.11   6. Essential hypertension  I10   7. Hypertensive retinopathy of both eyes  H35.033   8. Combined forms of age-related cataract of left eye  H25.812   9. Pseudophakia of right eye  Z96.1     1,2.  Proliferative diabetic retinopathy w/ DME, OU  - HbA1c 8.3% (12.2.20), 8.6% (06.30.20), 8.4% (02.14.20)  - s/p IVA OD #1 9.20.19, #2 (10.25.19), #3 (11.15.19), #4 (12.17.19), #5 (01.14.20), #6 (2.11.20), #7 (05.29.20), #8 (08.19.20), #9 (10.30.20), #10 (12.09.20), #11 (01.11.21), #12 (02.15.21), #13 (03.23.21)  - s/p IVA OS #1 9.27.19, #2 (10.25.19), #3 (11.15.19), #4 (12.17.19), #5 (01.14.20), #6 (2.11.20), #7 (04.26.20), #8 (05.29.20), #9 (06.26.20), #10 (08.05.20), #11 (11.11.20), #12 (12.09.20), #13 (01.11.21), #14 (02.15.21), #15 (03.23.21)  - S/P PRP OS (09.20.19), (5.19.20), (08.19.20)  - S/P PRP OD (9.27.19 and 11.21.19), fill-in (  04.14.20) (09.03.20, surgery)  - FA (9.20.19) shows +NVE OU and leaking MA and capillary nonperfusion  - repeat FA 11.15.19 shows NV regressing OU  - pre-op: OD w/ VA stable at 20/25, but there is some preretinal fibrosis / tractional membranes just superior to disc and mild central DME  - s/p 25g PPV+MP+10% C3F8 gas OD (09.03.20) -- ERM/PRF removal OD  - BCVA 20/20 OD             - fibrosis/ERM stably improved; retina attached  - OCT shows interval improvement in IRF OU  - recommend PRP  OS today, 04.28.21  - pt wishes to proceed with PRP  - RBA of procedure discussed, questions answered  - informed consent obtained   - see procedure note  - [place] informed consent form signed and scanned on 01.11.2021 (OU)  - f/u 4 weeks  3. Vitreous hemorrhage OS -- improving  - recurrent VH, onset 11.11.20  - initial onset 04.24.20  - etiology: secondary to PDR as described above (no RT/RD on exam)  - s/p PRP OS (9.20.19), (05.19.20), (08.19.20)  - s/p IVA OS on 4.26.20, 5.29.20, 6.26.20, 8.5.20,11.11.20, 12.06.20 and on as above  - today, further improvement in diffuse VH post-IVA OS -- pt reports improvement in floaters  - BCVA 20/40 today, but pt did not bring glasses  - recommend PRP OS fill in today as above  - may benefit from further PRP fill in OS for VH prophylaxis  - f/u 4 weeks  4. History of Endophthalmitis OD  - s/p IVA OU 04/09/2018  - s/p 25g PPV w/ intravitreal vanc, ceftaz and cefepime OD, 2.14.2020  - s/p intravitreal tap / vanc and ceflaz injections (02.16.20)             - gram stain (2.14.20) shows G+ cocci, WBCs mostly PMNs;   - repeat gram stain from t/i (2.16.20) -- no organisms, just WBCs             - cultures from vitreous grew rare Staph warneri; cultures from t/i -- no growth             - doing well, BCVA 20/30             - inflammation/posterior debris resolved             - IOP 16 off Brimonidine  - completed po pred taper -- caused significant elevations in BG  - monitor  5. History of Vitreous Hemorrhage OD -- cleared from PPV x2 for endophthalmitis and ERM/preretinal fibrosis  - secondary to PDR as described below  - S/P IVA OD #1 (09.20.19), #2 (10.25.19), #3 (11.15.19), #4 (12.16.19), #5 (03/12/2018)  - S/P PRP OD #1 (09.27.19), fill in OD (11.21.19) -- each somewhat limited inferiorly by residual VH  6,7. Hypertensive retinopathy OU  - discussed importance of tight BP control.  - monitor  8. Combined form age related cataract OS-    - The symptoms of cataract, surgical options, and treatments and risks were discussed with patient.  - discussed diagnosis and progression  9. Pseudophakia OD  - s/p CE/IOL OD (Dr. Zenia Resides, 12.11.20)  - beautiful surgery, doing well   Ophthalmic Meds Ordered this visit:  No orders of the defined types were placed in this encounter.      Return in about 4 weeks (around 07/23/2019) for Dilated Exam, OCT, Possible Injxn.  There are no Patient Instructions on file for this visit.   Explained the diagnoses, plan,  and follow up with the patient and they expressed understanding.  Patient expressed understanding of the importance of proper follow up care.    This document serves as a record of services personally performed by Gardiner Sleeper, MD, PhD. It was created on their behalf by Ernest Mallick, OA, an ophthalmic assistant. The creation of this record is the provider's dictation and/or activities during the visit.    Electronically signed by: Ernest Mallick, OA 04.28.2021 12:39 AM  Gardiner Sleeper, M.D., Ph.D. Diseases & Surgery of the Retina and [place] 06/25/2019   I have reviewed the above documentation for accuracy and completeness, and I agree with the above. Gardiner Sleeper, M.D., Ph.D. 06/27/19 12:39 AM   Abbreviations: M myopia (nearsighted); A astigmatism; H hyperopia (farsighted); P presbyopia; Mrx spectacle prescription;  CTL contact lenses; OD right eye; OS left eye; OU both eyes  XT exotropia; ET esotropia; PEK punctate epithelial keratitis; PEE punctate epithelial erosions; DES dry eye syndrome; MGD meibomian gland dysfunction; ATs artificial tears; PFAT's preservative free artificial tears; [place] nuclear sclerotic cataract; PSC posterior subcapsular cataract; ERM epi-retinal membrane; PVD posterior vitreous detachment; RD retinal detachment; DM diabetes mellitus; DR diabetic retinopathy; NPDR non-proliferative diabetic retinopathy; PDR  proliferative diabetic retinopathy; CSME clinically significant macular edema; DME diabetic macular edema; dbh dot blot hemorrhages; CWS cotton wool spot; POAG primary open angle glaucoma; C/D cup-to-disc ratio; HVF humphrey visual field; GVF goldmann visual field; OCT optical coherence tomography; IOP intraocular pressure; BRVO Branch retinal vein occlusion; CRVO central retinal vein occlusion; CRAO central retinal artery occlusion; BRAO branch retinal artery occlusion; RT retinal tear; SB scleral buckle; PPV pars plana vitrectomy; VH Vitreous hemorrhage; PRP panretinal laser photocoagulation; IVK intravitreal kenalog; VMT vitreomacular traction; MH Macular hole;  NVD neovascularization of the disc; NVE neovascularization elsewhere; AREDS age related eye disease study; ARMD age related macular degeneration; POAG primary open angle glaucoma; EBMD epithelial/anterior basement membrane dystrophy; ACIOL anterior chamber intraocular lens; IOL intraocular lens; PCIOL posterior chamber intraocular lens; Phaco/IOL phacoemulsification with intraocular lens placement; [place] photorefractive keratectomy; LASIK laser assisted in situ keratomileusis; HTN hypertension; DM diabetes mellitus; COPD chronic obstructive pulmonary disease

## 2019-06-25 ENCOUNTER — Encounter (INDEPENDENT_AMBULATORY_CARE_PROVIDER_SITE_OTHER): Payer: Self-pay | Admitting: Ophthalmology

## 2019-06-25 ENCOUNTER — Ambulatory Visit (INDEPENDENT_AMBULATORY_CARE_PROVIDER_SITE_OTHER): Payer: BC Managed Care – PPO | Admitting: Ophthalmology

## 2019-06-25 ENCOUNTER — Encounter: Payer: Self-pay | Admitting: Family Medicine

## 2019-06-25 ENCOUNTER — Other Ambulatory Visit: Payer: Self-pay

## 2019-06-25 DIAGNOSIS — H3581 Retinal edema: Secondary | ICD-10-CM

## 2019-06-25 DIAGNOSIS — H4312 Vitreous hemorrhage, left eye: Secondary | ICD-10-CM

## 2019-06-25 DIAGNOSIS — I10 Essential (primary) hypertension: Secondary | ICD-10-CM

## 2019-06-25 DIAGNOSIS — E113513 Type 2 diabetes mellitus with proliferative diabetic retinopathy with macular edema, bilateral: Secondary | ICD-10-CM

## 2019-06-25 DIAGNOSIS — Z961 Presence of intraocular lens: Secondary | ICD-10-CM

## 2019-06-25 DIAGNOSIS — H4311 Vitreous hemorrhage, right eye: Secondary | ICD-10-CM

## 2019-06-25 DIAGNOSIS — H25812 Combined forms of age-related cataract, left eye: Secondary | ICD-10-CM

## 2019-06-25 DIAGNOSIS — H35033 Hypertensive retinopathy, bilateral: Secondary | ICD-10-CM

## 2019-06-25 DIAGNOSIS — H44001 Unspecified purulent endophthalmitis, right eye: Secondary | ICD-10-CM

## 2019-06-26 ENCOUNTER — Encounter: Payer: Self-pay | Admitting: Family Medicine

## 2019-06-27 ENCOUNTER — Encounter (INDEPENDENT_AMBULATORY_CARE_PROVIDER_SITE_OTHER): Payer: Self-pay | Admitting: Ophthalmology

## 2019-07-08 ENCOUNTER — Encounter: Payer: Self-pay | Admitting: Family Medicine

## 2019-07-29 ENCOUNTER — Encounter (INDEPENDENT_AMBULATORY_CARE_PROVIDER_SITE_OTHER): Payer: BC Managed Care – PPO | Admitting: Ophthalmology

## 2019-08-04 NOTE — Progress Notes (Signed)
[facility] Note  08/05/2019     CHIEF COMPLAINT Patient presents for Retina Follow Up   HISTORY OF PRESENT ILLNESS: Gloria Gomez is a 49 y.o. female who presents to the clinic today for:   HPI    Retina Follow Up    Patient presents with  Diabetic Retinopathy.  In both eyes.  This started weeks ago.  Severity is moderate.  Duration of weeks.  Since onset it is stable.  I, the attending physician,  performed the HPI with the patient and updated documentation appropriately.          Comments    Pt states her vision is about the same OU.  Pt denies eye pain or discomfort and denies any new or worsening floaters or fol OU.       Last edited by Bernarda Caffey, MD on 08/05/2019  8:08 PM. (History)    pt states vision the same OU.   Referring physician:   HISTORICAL INFORMATION:   Selected notes from the MEDICAL RECORD NUMBER Referred for DM exam    CURRENT MEDICATIONS: No current outpatient medications on file. (Ophthalmic Drugs)   No current facility-administered medications for this visit. (Ophthalmic Drugs)   Current Outpatient Medications (Other)  Medication Sig  . glipiZIDE (GLUCOTROL XL) 10 MG 24 hr tablet Take 1 tablet (10 mg total) by mouth daily with breakfast.  . HYDROcodone-acetaminophen (NORCO/VICODIN) 5-325 MG tablet Take 1-2 tablets by mouth every 6 (six) hours as needed for moderate pain. (Patient not taking: Reported on 05/23/2019)  . INVOKANA 300 MG TABS tablet TAKE 1 TABLET BY MOUTH ONCE DAILY BEFORE BREAKFAST  . JANUMET 50-1000 MG tablet TAKE 1 TABLET BY MOUTH TWICE DAILY WITH A MEAL  . liraglutide (VICTOZA) 18 MG/3ML SOPN Inject 0.2 mLs (1.2 mg total) into the skin daily. Inject 0.6mg  SQ daily for 1 week, then increase to 1.2mg  SQ daily.  Marland Kitchen losartan (COZAAR) 100 MG tablet Take 1 tablet by mouth once daily  . metFORMIN (GLUCOPHAGE) 1000 MG tablet Take 1 tablet (1,000 mg total) by mouth 2 (two) times daily with a meal.  .  metoCLOPramide (REGLAN) 5 MG tablet Take 1 tablet (5 mg total) by mouth 3 (three) times daily with meals as needed for nausea. (Patient not taking: Reported on 05/23/2019)  . metoprolol succinate (TOPROL-XL) 100 MG 24 hr tablet TAKE 1 TABLET BY MOUTH ONCE DAILY TAKE  WITH  OR  IMMEDIATELY  FOLLOWING  A  MEAL  . Multiple Vitamin (MULTIVITAMIN WITH MINERALS) TABS tablet Take 1 tablet by mouth daily.  Marland Kitchen OVER THE COUNTER MEDICATION Take 2 tablets by mouth daily. Neu Remedy  . pantoprazole (PROTONIX) 40 MG tablet Take 1 tablet by mouth once daily  . promethazine (PHENERGAN) 12.5 MG tablet 1-2 tabs po q6h prn nausea (Patient not taking: Reported on 05/23/2019)  . rosuvastatin (CRESTOR) 10 MG tablet Take 1 tablet (10 mg total) by mouth daily. (Patient taking differently: Take 10 mg by mouth every other day. )   No current facility-administered medications for this visit. (Other)      REVIEW OF SYSTEMS: ROS    Positive for: Endocrine, Eyes   Negative for: Constitutional, Gastrointestinal, Neurological, Skin, Genitourinary, Musculoskeletal, HENT, Cardiovascular, Respiratory, Psychiatric, Allergic/Imm, Heme/Lymph   Last edited by [name] on 08/05/2019  3:01 PM. (History)       ALLERGIES Allergies  Allergen Reactions  . Gluten Meal Swelling  . Lisinopril Cough  . Pioglitazone Other (See Comments)  ELEVATED glucoses + worse chronic nausea    PAST MEDICAL HISTORY Past Medical History:  Diagnosis Date  . Abdominal bloating    likely from diab gastroparesis.  Dr. Havery Moros started trial of reglan 03/2019.  Marland Kitchen Benign brain tumor ([place])    Cystic lesion in cerebral aqueduct region with mild hydrocephalus-- stable MRI 02/2016.  Surveillance MRI 05/2017 --dilated cerebral aqueduct related to aqueductal stenosis and subsequent mild hydrocephalus (due to the 11 mm stable cystic lesion in cerebral aqueduct---?congenitial?.  . Cataract    OU  . Diabetic retinopathy ([place])   . Dysmenorrhea     vicodin occ during first 2 days of cycle.  . Gluten intolerance    pt reports she underwent full GI w/u to r/o celiac dz  . Hepatic steatosis    ultrasound 08/2017  . History of adenomatous polyp of colon 04/08/2019   recall Feb 2024  . Hyperlipidemia, mixed   . Hypertensive retinopathy of both eyes   . Insomnia   . Iron deficiency 01/2019   Hb 11.3. Hemoccults neg x 3 03/19/19. EGD and colonoscopy 04/08/19 showed NO cause for IDA.  Pt does have menorrhagia, though, so she'll see her GYN.  started FeSO4 325 qd approx 04/14/19.  . Menorrhagia   . Proliferative diabetic retinopathy of both eyes ([place])    steroid injections 10/2017--improved  . Sensorineural hearing loss of left ear    Sudden left hearing loss summer 2016--no improvement with steroids 01/2015 so brain MRI done by Dr. Redmond Baseman and it showed brain tumor that was determined to be benign.  Pt's hearing not bad enough for hearing aid as of 06/2016.  . Type 2 diabetes with complication (HCC)    +microalbuminuria, diab retpthy, diabetic gastroparesis (gastric emptying study mildly abnl 03/2017).  Recommended lantus 08/2018 but pt declined. Mild microalbuminuria.  Marland Kitchen Uterine fibroid   . White coat hypertension    Past Surgical History:  Procedure Laterality Date  . ANOSCOPY  05/12/2019   Procedure: normal exam, minimal hemorrhoid disease. Hyertrophied anal papila, benign appearing, posterior midline. Surgeon: Leighton Ruff MD  . CHOLECYSTECTOMY  2000  . COLONOSCOPY  04/08/2019   5 adenomas, recall 3 yrs; no cause for IDA found.  Hypertrophied anal papillae->bx showed low grade dysplasia; GI referred her to colorectal surgeon.  . ESOPHAGOGASTRODUODENOSCOPY  04/08/2019   mild chronic reactive gastritis. H pylori NEG.  No cause for IDA found.  . GAS INSERTION Right 10/31/2018   Procedure: Insertion Of C3F8 Gas;  Surgeon: Bernarda Caffey, MD;  Location: [place];  Service: Ophthalmology;  Laterality: Right;  . GASTRIC EMPTYING SCAN  04/20/2017    Mildly abnormal, particularly the 1st hour of emptying.  Marland Kitchen MEMBRANE PEEL Right 10/31/2018   Procedure: MEMBRANE PEEL;  Surgeon: Bernarda Caffey, MD;  Location: [place];  Service: Ophthalmology;  Laterality: Right;  . PARS PLANA VITRECTOMY Right 04/12/2018   Procedure: Right PARS PLANA VITRECTOMY WITH 25 GAUGE with intravitreal antibiotics;  Surgeon: Bernarda Caffey, MD;  Location: [place];  Service: Ophthalmology;  Laterality: Right;  . PARS PLANA VITRECTOMY Right 10/31/2018   Procedure: PARS PLANA VITRECTOMY WITH 25 GAUGE;  Surgeon: Bernarda Caffey, MD;  Location: [place];  Service: Ophthalmology;  Laterality: Right;  . PHOTOCOAGULATION WITH LASER Right 10/31/2018   Procedure: Photocoagulation With Laser;  Surgeon: Bernarda Caffey, MD;  Location: [place];  Service: Ophthalmology;  Laterality: Right;    FAMILY HISTORY Family History  Problem Relation Age of Onset  . Brain cancer Mother   . Diabetes  Father   . Diabetes Maternal Grandmother   . Cataracts Maternal Grandmother   . Cervical cancer Paternal Grandmother   . Colon cancer Maternal Grandfather 11  . Amblyopia Neg Hx   . Blindness Neg Hx   . Glaucoma Neg Hx   . Macular degeneration Neg Hx   . Retinal detachment Neg Hx   . Strabismus Neg Hx   . Retinitis pigmentosa Neg Hx   . Esophageal cancer Neg Hx   . Stomach cancer Neg Hx   . Rectal cancer Neg Hx     SOCIAL HISTORY Social History   Tobacco Use  . Smoking status: Never Smoker  . Smokeless tobacco: Never Used  Substance Use Topics  . Alcohol use: No  . Drug use: No         OPHTHALMIC EXAM:  Base Eye Exam    Visual Acuity (Snellen - Linear)      Right Left   Dist [place] 20/25 -2    Dist cc  20/30 -2   Dist ph [place] NI    Dist ph cc  20/25 -1       Tonometry (Tonopen, 3:06 PM)      Right Left   Pressure 13 15       Pupils      Dark Light Shape React APD   Right 3 2 Round Brisk 0   Left 3 2 Round Brisk 0       Visual Fields      Left Right    Full Full       Extraocular  Movement      Right Left    Full Full       Neuro/Psych    Oriented x3: Yes   Mood/Affect: Normal       Dilation    Both eyes: 1.0% Mydriacyl, 2.5% Phenylephrine @ 3:06 PM        Slit Lamp and Fundus Exam    Slit Lamp Exam      Right Left   Lids/Lashes Dermatochalasis - upper lid, mild Meibomian gland dysfunction, Telangiectasia Dermatochalasis - upper lid, Meibomian gland dysfunction, Telangiectasia   Conjunctiva/Sclera White and quiet, sutures intact White and quiet   Cornea Clear, trace Punctate epithelial erosions, well healed temporal cataract wounds Trace Punctate epithelial erosions   Anterior Chamber Deep and quiet Deep and quiet   Iris Round and dilated, No NVI Round and dilated, No NVI   Lens PC IOL in good position, 1-2+ Posterior capsular opacification, PC folds 2+ Nuclear sclerosis, 2+ Cortical cataract   Vitreous post vitrectomy, trace pigment Vitreous syneresis, +RBC in anterior vitreous improving, VH clearing and settling inferiorly, vitreous condensations, residual blood clots settling inferiorly and clearing       Fundus Exam      Right Left   Disc mild Pallor, Sharp rim Pink and sharp, Compact   C/D Ratio 0.2 0.1   Macula Flat, blunted foveal reflex, central cystic changes--persistent/slightly increased, scattered MA/exudate, cluster of MA and exudates greatest ST to fovea -- persistent Flat, blunted foveal reflex, scattered MA, scattered cystic changes, scattered punctate exudates - improving, trace ERM    Vessels mild Vascular attenuation, Tortuous Vascular attenuation, mild Tortuousity   Periphery Attached, tractional fibrosis in SN quadrant removed, scattered DBH greatest superiorly, 360 PRP, good laser fill in 360 attached, 360 PRP scars good posterior fill in, inferior retina obscured by settling VH, clot        Refraction    Wearing Rx  Sphere Cylinder   Right -3.75 Sphere   Left -3.75 Sphere          IMAGING AND PROCEDURES  Imaging and  Procedures for @TODAY @  OCT, Retina - OU - Both Eyes       Right Eye Quality was good. Central Foveal Thickness: 365. Progression has worsened. Findings include abnormal foveal contour, epiretinal membrane, no SRF, intraretinal hyper-reflective material, vitreous traction, preretinal fibrosis (Mild interval increase in IRF/cystic changes temporal macula).   Left Eye Quality was good. Central Foveal Thickness: 295. Progression has improved. Findings include normal foveal contour, intraretinal fluid, no SRF (Persistent IRF temporal macula improved; Mild ERM ).   Notes *Images captured and stored on drive  Diagnosis / Impression:  DME OU OD: Mild interval increase in IRF/cystic changes temporal macula OS: Persistent IRF temporal macula--improved; Mild ERM    Clinical management:  See below  Abbreviations: NFP - Normal foveal profile. CME - cystoid macular edema. PED - pigment epithelial detachment. IRF - intraretinal fluid. SRF - subretinal fluid. EZ - ellipsoid zone. ERM - epiretinal membrane. ORA - outer retinal atrophy. ORT - outer retinal tubulation. SRHM - subretinal hyper-reflective material         Intravitreal Injection, Pharmacologic Agent - OD - Right Eye       Time Out 08/05/2019. 4:11 PM. Confirmed correct patient, procedure, site, and patient consented.   Anesthesia Topical anesthesia was used. Anesthetic medications included Lidocaine 2%, Proparacaine 0.5%.   Procedure Preparation included 5% betadine to ocular surface, eyelid speculum. A supplied needle was used.   Injection:  1.25 mg Bevacizumab (AVASTIN) SOLN   NDC: 42353-614-43, Lot: [id number] , Expiration date: 10/01/2019   Route: Intravitreal, Site: Right Eye, Waste: 0 mL  Post-op Post injection exam found visual acuity of at least counting fingers. The patient tolerated the procedure well. There were no complications. The patient received written and verbal post procedure care education. Post injection  medications were not given.        Intravitreal Injection, Pharmacologic Agent - OS - Left Eye       Time Out 08/05/2019. 4:12 PM. Confirmed correct patient, procedure, site, and patient consented.   Anesthesia Topical anesthesia was used. Anesthetic medications included Lidocaine 2%, Proparacaine 0.5%.   Procedure Preparation included 5% betadine to ocular surface, eyelid speculum. A supplied needle was used.   Injection:  1.25 mg Bevacizumab (AVASTIN) SOLN   NDC: 19/09/2019, Lot: [id number] , Expiration date: 10/08/2019   Route: Intravitreal, Site: Left Eye, Waste: 0 mL  Post-op Post injection exam found visual acuity of at least counting fingers. The patient tolerated the procedure well. There were no complications. The patient received written and verbal post procedure care education.                ASSESSMENT/PLAN:    ICD-10-CM   1. Proliferative diabetic retinopathy of both eyes with macular edema associated with type 2 diabetes mellitus (HCC)  [id number] Intravitreal Injection, Pharmacologic Agent - OD - Right Eye    Intravitreal Injection, Pharmacologic Agent - OS - Left Eye    Bevacizumab (AVASTIN) SOLN 1.25 mg    Bevacizumab (AVASTIN) SOLN 1.25 mg  2. Retinal edema  H35.81 OCT, Retina - OU - Both Eyes  3. Vitreous hemorrhage of left eye (HCC)  H43.12   4. Right endophthalmia  H44.001   5. Vitreous hemorrhage, right eye ([place])  H43.11   6. Essential hypertension  I10   7. Hypertensive retinopathy of both eyes  H35.033   8.  Combined forms of age-related cataract of left eye  H25.812   9. Pseudophakia of right eye  Z96.1     1,2.  Proliferative diabetic retinopathy w/ DME, OU  - HbA1c 8.3% (12.2.20), 8.6% (06.30.20), 8.4% (02.14.20)  - s/p IVA OD #1 9.20.19, #2 (10.25.19), #3 (11.15.19), #4 (12.17.19), #5 (01.14.20), #6 (2.11.20), #7 (05.29.20), #8 (08.19.20), #9 (10.30.20), #10 (12.09.20), #11 (01.11.21), #12 (02.15.21), #13 (03.23.21), #14 (04.20.21)  - s/p  IVA OS #1 9.27.19, #2 (10.25.19), #3 (11.15.19), #4 (12.17.19), #5 (01.14.20), #6 (2.11.20), #7 (04.26.20), #8 (05.29.20), #9 (06.26.20), #10 (08.05.20), #11 (11.11.20), #12 (12.09.20), #13 (01.11.21), #14 (02.15.21), #15 (03.23.21), #16 (04.20.21)  - S/P PRP OS (09.20.19), (5.19.20), (08.19.20), (04.28.21)  - S/P PRP OD (9.27.19 and 11.21.19), fill-in (04.14.20) (09.03.20, surgery)  - FA (9.20.19) shows +NVE OU and leaking MA and capillary nonperfusion  - repeat FA 11.15.19 shows NV regressing OU  - pre-op: OD w/ VA stable at 20/25, but there is some preretinal fibrosis / tractional membranes just superior to disc and mild central DME  - s/p 25g PPV+MP+10% C3F8 gas OD (09.03.20) -- ERM/PRF removal OD  - BCVA 20/25-2 OD (slightly down from 20/20)             - fibrosis/ERM stably improved; retina attached  - OCT shows interval increase in IRF/cystic changes OD; persistent IRF temporal macula--improved OS  - recommend IVA #15 OD and #17 OS today, (06.08.21)  - pt wishes to proceed with IVA OU  - RBA of procedure discussed, questions answered  - informed consent obtained   - see procedure note  - Avastin informed consent form signed and scanned on 01.11.2021 (OU)  - f/u 4 weeks --   3. Vitreous hemorrhage OS -- improving  - recurrent VH, onset 11.11.20  - initial onset 04.24.20  - etiology: secondary to PDR as described above (no RT/RD on exam)  - s/p PRP OS (9.20.19), (05.19.20), (08.19.20), (04.28.21)  - s/p IVA OS on 4.26.20, 5.29.20, 6.26.20, 8.5.20,11.11.20, 12.06.20 and so on as above  - today, further improvement in diffuse VH post-IVA OS -- pt reports improvement in floaters  - BCVA 20/25 today  4. History of Endophthalmitis OD  - s/p IVA OU 04/09/2018  - s/p 25g PPV w/ intravitreal vanc, ceftaz and cefepime OD, 2.14.2020  - s/p intravitreal tap / vanc and ceflaz injections (02.16.20)             - gram stain (2.14.20) shows G+ cocci, WBCs mostly PMNs;   - repeat gram stain from  t/i (2.16.20) -- no organisms, just WBCs             - cultures from vitreous grew rare Staph warneri; cultures from t/i -- no growth             - doing well, BCVA 20/30             - inflammation/posterior debris resolved             - IOP 16 off Brimonidine  - completed po pred taper -- caused significant elevations in BG  - monitor  5. History of Vitreous Hemorrhage OD -- cleared from PPV x2 for endophthalmitis and ERM/preretinal fibrosis  - secondary to PDR as described below  - S/P IVA OD #1 (09.20.19), #2 (10.25.19), #3 (11.15.19), #4 (12.16.19), #5 (03/12/2018)  - S/P PRP OD #1 (09.27.19), fill in OD (11.21.19) -- each somewhat limited inferiorly by residual VH  6,7. Hypertensive retinopathy OU  -  discussed importance of tight BP control.  - monitor  8. Combined form age related cataract OS-   - The symptoms of cataract, surgical options, and treatments and risks were discussed with patient.  - discussed diagnosis and progression  9. Pseudophakia OD  - s/p CE/IOL OD (Dr. Zenia Resides, 12.11.20)  - beautiful surgery, doing well   Ophthalmic Meds Ordered this visit:  Meds ordered this encounter  Medications  . Bevacizumab (AVASTIN) SOLN 1.25 mg  . Bevacizumab (AVASTIN) SOLN 1.25 mg       Return in about 4 weeks (around 09/02/2019) for DFE, OCT.  There are no Patient Instructions on file for this visit.   Explained the diagnoses, plan, and follow up with the patient and they expressed understanding.  Patient expressed understanding of the importance of proper follow up care.   This document serves as a record of services personally performed by Gardiner Sleeper, MD, PhD. It was created on their behalf by Roselee Nova, COMT. The creation of this record is the provider's dictation and/or activities during the visit.  Electronically signed by: Roselee Nova, COMT 08/05/19 8:17 PM  Gardiner Sleeper, M.D., Ph.D. Diseases & Surgery of the Retina and [place] 08/05/2019   I have reviewed the above documentation for accuracy and completeness, and I agree with the above. Gardiner Sleeper, M.D., Ph.D. 08/05/19 8:17 PM    Abbreviations: M myopia (nearsighted); A astigmatism; H hyperopia (farsighted); P presbyopia; Mrx spectacle prescription;  CTL contact lenses; OD right eye; OS left eye; OU both eyes  XT exotropia; ET esotropia; PEK punctate epithelial keratitis; PEE punctate epithelial erosions; DES dry eye syndrome; MGD meibomian gland dysfunction; ATs artificial tears; PFAT's preservative free artificial tears; [place] nuclear sclerotic cataract; PSC posterior subcapsular cataract; ERM epi-retinal membrane; PVD posterior vitreous detachment; RD retinal detachment; DM diabetes mellitus; DR diabetic retinopathy; NPDR non-proliferative diabetic retinopathy; PDR proliferative diabetic retinopathy; CSME clinically significant macular edema; DME diabetic macular edema; dbh dot blot hemorrhages; CWS cotton wool spot; POAG primary open angle glaucoma; C/D cup-to-disc ratio; HVF humphrey visual field; GVF goldmann visual field; OCT optical coherence tomography; IOP intraocular pressure; BRVO Branch retinal vein occlusion; CRVO central retinal vein occlusion; CRAO central retinal artery occlusion; BRAO branch retinal artery occlusion; RT retinal tear; SB scleral buckle; PPV pars plana vitrectomy; VH Vitreous hemorrhage; PRP panretinal laser photocoagulation; IVK intravitreal kenalog; VMT vitreomacular traction; MH Macular hole;  NVD neovascularization of the disc; NVE neovascularization elsewhere; AREDS age related eye disease study; ARMD age related macular degeneration; POAG primary open angle glaucoma; EBMD epithelial/anterior basement membrane dystrophy; ACIOL anterior chamber intraocular lens; IOL intraocular lens; PCIOL posterior chamber intraocular lens; Phaco/IOL phacoemulsification with intraocular lens placement; [place] photorefractive keratectomy;  LASIK laser assisted in situ keratomileusis; HTN hypertension; DM diabetes mellitus; COPD chronic obstructive pulmonary disease

## 2019-08-05 ENCOUNTER — Encounter (INDEPENDENT_AMBULATORY_CARE_PROVIDER_SITE_OTHER): Payer: Self-pay | Admitting: Ophthalmology

## 2019-08-05 ENCOUNTER — Ambulatory Visit (INDEPENDENT_AMBULATORY_CARE_PROVIDER_SITE_OTHER): Payer: BC Managed Care – PPO | Admitting: Ophthalmology

## 2019-08-05 ENCOUNTER — Other Ambulatory Visit: Payer: Self-pay

## 2019-08-05 DIAGNOSIS — H4312 Vitreous hemorrhage, left eye: Secondary | ICD-10-CM

## 2019-08-05 DIAGNOSIS — E113513 Type 2 diabetes mellitus with proliferative diabetic retinopathy with macular edema, bilateral: Secondary | ICD-10-CM | POA: Diagnosis not present

## 2019-08-05 DIAGNOSIS — H4311 Vitreous hemorrhage, right eye: Secondary | ICD-10-CM

## 2019-08-05 DIAGNOSIS — H3581 Retinal edema: Secondary | ICD-10-CM

## 2019-08-05 DIAGNOSIS — H44001 Unspecified purulent endophthalmitis, right eye: Secondary | ICD-10-CM

## 2019-08-05 DIAGNOSIS — H25812 Combined forms of age-related cataract, left eye: Secondary | ICD-10-CM

## 2019-08-05 DIAGNOSIS — H35033 Hypertensive retinopathy, bilateral: Secondary | ICD-10-CM

## 2019-08-05 DIAGNOSIS — I10 Essential (primary) hypertension: Secondary | ICD-10-CM

## 2019-08-05 DIAGNOSIS — Z961 Presence of intraocular lens: Secondary | ICD-10-CM

## 2019-08-05 MED ORDER — BEVACIZUMAB CHEMO INJECTION 1.25MG/0.05ML SYRINGE FOR KALEIDOSCOPE
1.2500 mg | INTRAVITREAL | Status: AC | PRN
Start: 2019-08-05 — End: 2019-08-05
  Administered 2019-08-05: 1.25 mg via INTRAVITREAL

## 2019-08-05 MED ORDER — BEVACIZUMAB CHEMO INJECTION 1.25MG/0.05ML SYRINGE FOR KALEIDOSCOPE
1.2500 mg | INTRAVITREAL | Status: AC | PRN
  Administered 2019-08-05: 1.25 mg via INTRAVITREAL

## 2019-08-27 ENCOUNTER — Other Ambulatory Visit: Payer: Self-pay | Admitting: Family Medicine

## 2019-09-02 ENCOUNTER — Encounter (INDEPENDENT_AMBULATORY_CARE_PROVIDER_SITE_OTHER): Payer: Self-pay | Admitting: Ophthalmology

## 2019-09-02 ENCOUNTER — Ambulatory Visit (INDEPENDENT_AMBULATORY_CARE_PROVIDER_SITE_OTHER): Payer: BLUE CROSS/BLUE SHIELD | Admitting: Ophthalmology

## 2019-09-02 DIAGNOSIS — H3581 Retinal edema: Secondary | ICD-10-CM | POA: Diagnosis not present

## 2019-09-02 DIAGNOSIS — H35033 Hypertensive retinopathy, bilateral: Secondary | ICD-10-CM

## 2019-09-02 DIAGNOSIS — H4311 Vitreous hemorrhage, right eye: Secondary | ICD-10-CM

## 2019-09-02 DIAGNOSIS — H44001 Unspecified purulent endophthalmitis, right eye: Secondary | ICD-10-CM | POA: Diagnosis not present

## 2019-09-02 DIAGNOSIS — Z961 Presence of intraocular lens: Secondary | ICD-10-CM

## 2019-09-02 DIAGNOSIS — E113513 Type 2 diabetes mellitus with proliferative diabetic retinopathy with macular edema, bilateral: Secondary | ICD-10-CM | POA: Diagnosis not present

## 2019-09-02 DIAGNOSIS — H25812 Combined forms of age-related cataract, left eye: Secondary | ICD-10-CM

## 2019-09-02 DIAGNOSIS — H4312 Vitreous hemorrhage, left eye: Secondary | ICD-10-CM | POA: Diagnosis not present

## 2019-09-02 DIAGNOSIS — I10 Essential (primary) hypertension: Secondary | ICD-10-CM

## 2019-09-02 MED ORDER — BEVACIZUMAB CHEMO INJECTION 1.25MG/0.05ML SYRINGE FOR KALEIDOSCOPE
1.2500 mg | INTRAVITREAL | Status: AC | PRN
  Administered 2019-09-02: 1.25 mg via INTRAVITREAL

## 2019-09-02 NOTE — Progress Notes (Deleted)
[facility] Note  09/02/2019     CHIEF COMPLAINT Patient presents for Retina Follow Up   HISTORY OF PRESENT ILLNESS: Gloria Gomez is a 49 y.o. female who presents to the clinic today for:   HPI    Retina Follow Up    Patient presents with  Diabetic Retinopathy.  In both eyes.  This started months ago.  Severity is moderate.  Duration of 4 weeks.  Since onset it is stable.  I, the attending physician,  performed the HPI with the patient and updated documentation appropriately.          Comments    49 y/o female pt here for 4 wk f/u for PDR w/mac edema OU.  No change in [place] OD.  VA OS seems a little "darker" over the past few wks, and feels like she can see "blood vessels" in her left eye.  Denies pain, FOL, floaters.  No gtts.  BS 143 2 days ago.  A1C 8.0 3 mos ago.       Last edited by Bernarda Caffey, MD on 09/02/2019  4:28 PM. (History)    pt states she has had some problems with her blood pressure since she was here last, she states she had a lot of swelling in her legs, feet and hands and was put on Lasik, which did seem to help, she feels like those symptoms are now resolved   Referring physician:   HISTORICAL INFORMATION:   Selected notes from the MEDICAL RECORD NUMBER Referred for DM exam    CURRENT MEDICATIONS: No current outpatient medications on file. (Ophthalmic Drugs)   No current facility-administered medications for this visit. (Ophthalmic Drugs)   Current Outpatient Medications (Other)  Medication Sig  . glipiZIDE (GLUCOTROL XL) 10 MG 24 hr tablet Take 1 tablet (10 mg total) by mouth daily with breakfast.  . HYDROcodone-acetaminophen (NORCO/VICODIN) 5-325 MG tablet Take 1-2 tablets by mouth every 6 (six) hours as needed for moderate pain. (Patient not taking: Reported on 05/23/2019)  . INVOKANA 300 MG TABS tablet TAKE 1 TABLET BY MOUTH ONCE DAILY BEFORE BREAKFAST  . JANUMET 50-1000 MG tablet TAKE 1 TABLET BY MOUTH TWICE DAILY WITH  A MEAL  . liraglutide (VICTOZA) 18 MG/3ML SOPN Inject 0.2 mLs (1.2 mg total) into the skin daily. Inject 0.31m SQ daily for 1 week, then increase to 1.222mSQ daily.  . Marland Kitchenosartan (COZAAR) 100 MG tablet Take 1 tablet by mouth once daily  . metFORMIN (GLUCOPHAGE) 1000 MG tablet Take 1 tablet (1,000 mg total) by mouth 2 (two) times daily with a meal.  . metoCLOPramide (REGLAN) 5 MG tablet Take 1 tablet (5 mg total) by mouth 3 (three) times daily with meals as needed for nausea. (Patient not taking: Reported on 05/23/2019)  . metoprolol succinate (TOPROL-XL) 100 MG 24 hr tablet TAKE 1 TABLET BY MOUTH ONCE DAILY TAKE  WITH  OR  IMMEDIATELY  FOLLOWING  A  MEAL  . Multiple Vitamin (MULTIVITAMIN WITH MINERALS) TABS tablet Take 1 tablet by mouth daily.  . Marland KitchenVER THE COUNTER MEDICATION Take 2 tablets by mouth daily. Neu Remedy  . pantoprazole (PROTONIX) 40 MG tablet Take 1 tablet by mouth once daily  . promethazine (PHENERGAN) 12.5 MG tablet 1-2 tabs po q6h prn nausea (Patient not taking: Reported on 05/23/2019)  . rosuvastatin (CRESTOR) 10 MG tablet Take 1 tablet (10 mg total) by mouth daily. (Patient taking differently: Take 10 mg by mouth every other day. )  No current facility-administered medications for this visit. (Other)      REVIEW OF SYSTEMS: ROS    Positive for: Endocrine, Eyes   Negative for: Constitutional, Gastrointestinal, Neurological, Skin, Genitourinary, Musculoskeletal, HENT, Cardiovascular, Respiratory, Psychiatric, Allergic/Imm, Heme/Lymph   Last edited by Matthew Folks, COA on 09/02/2019  3:58 PM. (History)       ALLERGIES Allergies  Allergen Reactions  . Gluten Meal Swelling  . Lisinopril Cough  . Pioglitazone Other (See Comments)    ELEVATED glucoses + worse chronic nausea    PAST MEDICAL HISTORY Past Medical History:  Diagnosis Date  . Abdominal bloating    likely from diab gastroparesis.  Dr. Havery Moros started trial of reglan 03/2019.  Marland Kitchen Benign brain tumor ([place])     Cystic lesion in cerebral aqueduct region with mild hydrocephalus-- stable MRI 02/2016.  Surveillance MRI 05/2017 --dilated cerebral aqueduct related to aqueductal stenosis and subsequent mild hydrocephalus (due to the 11 mm stable cystic lesion in cerebral aqueduct---?congenitial?.  . Cataract    OU  . Diabetic retinopathy ([place])   . Dysmenorrhea    vicodin occ during first 2 days of cycle.  . Gluten intolerance    pt reports she underwent full GI w/u to r/o celiac dz  . Hepatic steatosis    ultrasound 08/2017  . History of adenomatous polyp of colon 04/08/2019   recall Feb 2024  . Hyperlipidemia, mixed   . Hypertensive retinopathy of both eyes   . Insomnia   . Iron deficiency 01/2019   Hb 11.3. Hemoccults neg x 3 03/19/19. EGD and colonoscopy 04/08/19 showed NO cause for IDA.  Pt does have menorrhagia, though, so she'll see her GYN.  started FeSO4 325 qd approx 04/14/19.  . Menorrhagia   . Proliferative diabetic retinopathy of both eyes ([place])    steroid injections 10/2017--improved  . Sensorineural hearing loss of left ear    Sudden left hearing loss summer 2016--no improvement with steroids 01/2015 so brain MRI done by Dr. Redmond Baseman and it showed brain tumor that was determined to be benign.  Pt's hearing not bad enough for hearing aid as of 06/2016.  . Type 2 diabetes with complication (HCC)    +microalbuminuria, diab retpthy, diabetic gastroparesis (gastric emptying study mildly abnl 03/2017).  Recommended lantus 08/2018 but pt declined. Mild microalbuminuria.  Marland Kitchen Uterine fibroid   . White coat hypertension    Past Surgical History:  Procedure Laterality Date  . ANOSCOPY  05/12/2019   Procedure: normal exam, minimal hemorrhoid disease. Hyertrophied anal papila, benign appearing, posterior midline. Surgeon: Leighton Ruff MD  . CHOLECYSTECTOMY  2000  . COLONOSCOPY  04/08/2019   5 adenomas, recall 3 yrs; no cause for IDA found.  Hypertrophied anal papillae->bx showed low grade dysplasia; GI  referred her to colorectal surgeon.  . ESOPHAGOGASTRODUODENOSCOPY  04/08/2019   mild chronic reactive gastritis. H pylori NEG.  No cause for IDA found.  . GAS INSERTION Right 10/31/2018   Procedure: Insertion Of C3F8 Gas;  Surgeon: Bernarda Caffey, MD;  Location: [place];  Service: Ophthalmology;  Laterality: Right;  . GASTRIC EMPTYING SCAN  04/20/2017   Mildly abnormal, particularly the 1st hour of emptying.  Marland Kitchen MEMBRANE PEEL Right 10/31/2018   Procedure: MEMBRANE PEEL;  Surgeon: Bernarda Caffey, MD;  Location: [place];  Service: Ophthalmology;  Laterality: Right;  . PARS PLANA VITRECTOMY Right 04/12/2018   Procedure: Right PARS PLANA VITRECTOMY WITH 25 GAUGE with intravitreal antibiotics;  Surgeon: Bernarda Caffey, MD;  Location: [place];  Service: Ophthalmology;  Laterality: Right;  . PARS PLANA VITRECTOMY Right 10/31/2018   Procedure: PARS PLANA VITRECTOMY WITH 25 GAUGE;  Surgeon: Bernarda Caffey, MD;  Location: [place];  Service: Ophthalmology;  Laterality: Right;  . PHOTOCOAGULATION WITH LASER Right 10/31/2018   Procedure: Photocoagulation With Laser;  Surgeon: Bernarda Caffey, MD;  Location: [place];  Service: Ophthalmology;  Laterality: Right;    FAMILY HISTORY Family History  Problem Relation Age of Onset  . Brain cancer Mother   . Diabetes Father   . Diabetes Maternal Grandmother   . Cataracts Maternal Grandmother   . Cervical cancer Paternal Grandmother   . Colon cancer Maternal Grandfather 31  . Amblyopia Neg Hx   . Blindness Neg Hx   . Glaucoma Neg Hx   . Macular degeneration Neg Hx   . Retinal detachment Neg Hx   . Strabismus Neg Hx   . Retinitis pigmentosa Neg Hx   . Esophageal cancer Neg Hx   . Stomach cancer Neg Hx   . Rectal cancer Neg Hx     SOCIAL HISTORY Social History   Tobacco Use  . Smoking status: Never Smoker  . Smokeless tobacco: Never Used  Vaping Use  . Vaping Use: Never used  Substance Use Topics  . Alcohol use: No  . Drug use: No         OPHTHALMIC EXAM:  Base  Eye Exam    Visual Acuity (Snellen - Linear)      Right Left   Dist [place] 20/25 -2    Dist cc  20/30 -2   Dist ph  NI    Dist ph cc  20/25 -2       Tonometry (Tonopen, 4:21 PM)      Right Left   Pressure 14 14       Pupils      Dark Light Shape React APD   Right 3 2 Round Brisk None   Left 3 2 Round Brisk None       Visual Fields (Counting fingers)      Left Right    Full Full       Extraocular Movement      Right Left    Full, Ortho Full, Ortho       Neuro/Psych    Oriented x3: Yes   Mood/Affect: Normal       Dilation    Both eyes: 1.0% Mydriacyl, 2.5% Phenylephrine @ 4:21 PM          IMAGING AND PROCEDURES  Imaging and Procedures for _0 @          ASSESSMENT/PLAN:    ICD-10-CM   1. Proliferative diabetic retinopathy of both eyes with macular edema associated with type 2 diabetes mellitus ([place])  Z61.0960   2. Retinal edema  H35.81 OCT, Retina - OU - Both Eyes  3. Vitreous hemorrhage of left eye (HCC)  H43.12   4. Right endophthalmia  H44.001   5. Vitreous hemorrhage, right eye ([place])  H43.11   6. Essential hypertension  I10   7. Hypertensive retinopathy of both eyes  H35.033   8. Combined forms of age-related cataract of left eye  H25.812   9. Pseudophakia of right eye  Z96.1     1,2.  Proliferative diabetic retinopathy w/ DME, OU  - HbA1c 8.3% (12.2.20), 8.6% (06.30.20), 8.4% (02.14.20)  - s/p IVA OD #1 9.20.19, #2 (10.25.19), #3 (11.15.19), #4 (12.17.19), #5 (01.14.20), #6 (2.11.20), #7 (05.29.20), #8 (08.19.20), #9 (10.30.20), #10 (12.09.20), #11 (01.11.21), #12 (02.15.21), #  13 (03.23.21), #14 (04.20.21), #15 (06.08.21)  - s/p IVA OS #1 9.27.19, #2 (10.25.19), #3 (11.15.19), #4 (12.17.19), #5 (01.14.20), #6 (2.11.20), #7 (04.26.20), #8 (05.29.20), #9 (06.26.20), #10 (08.05.20), #11 (11.11.20), #12 (12.09.20), #13 (01.11.21), #14 (02.15.21), #15 (03.23.21), #16 (04.20.21), #17 (06.08.21)  - S/P PRP OS (09.20.19), (5.19.20), (08.19.20),  (04.28.21)  - S/P PRP OD (9.27.19 and 11.21.19), fill-in (04.14.20) (09.03.20, surgery)  - FA (9.20.19) shows +NVE OU and leaking MA and capillary nonperfusion  - repeat FA 11.15.19 shows NV regressing OU  - pre-op: OD w/ VA stable at 20/25, but there is some preretinal fibrosis / tractional membranes just superior to disc and mild central DME  - s/p 25g PPV+MP+10% C3F8 gas OD (09.03.20) -- ERM/PRF removal OD  - BCVA 20/25-2 OD (stable)             - fibrosis/ERM stably improved; retina attached  - OCT shows OD: Mild interval improvement in IRF; OS: Mild improvement in IRF temporal macula; Mild ERM  - recommend IVA #16 OD and #18 OS today, (07.06.21)  - pt wishes to proceed with IVA OU  - RBA of procedure discussed, questions answered  - informed consent obtained   - see procedure note  - [place] informed consent form signed and scanned on 01.11.2021 (OU)  - f/u 4 weeks -- DFE, OCT  3. Vitreous hemorrhage OS -- improving  - recurrent VH, onset 11.11.20  - initial onset 04.24.20  - etiology: secondary to PDR as described above (no RT/RD on exam)  - s/p PRP OS (9.20.19), (05.19.20), (08.19.20), (04.28.21)  - s/p IVA OS on 4.26.20, 5.29.20, 6.26.20, 8.5.20,11.11.20, 12.06.20 and so on as above  - today, further improvement in diffuse VH post-IVA OS -- pt reports improvement in floaters  - BCVA 20/25   4. History of Endophthalmitis OD  - s/p IVA OU 04/09/2018  - s/p 25g PPV w/ intravitreal vanc, ceftaz and cefepime OD, 2.14.2020  - s/p intravitreal tap / vanc and ceflaz injections (02.16.20)             - gram stain (2.14.20) shows G+ cocci, WBCs mostly PMNs;   - repeat gram stain from t/i (2.16.20) -- no organisms, just WBCs             - cultures from vitreous grew rare Staph warneri; cultures from t/i -- no growth             - doing well, BCVA 20/30             - inflammation/posterior debris resolved             - IOP 14 off Brimonidine  - completed po pred taper -- caused  significant elevations in BG  - monitor  5. History of Vitreous Hemorrhage OD -- cleared from PPV x2 for endophthalmitis and ERM/preretinal fibrosis  - secondary to PDR as described below  - S/P IVA OD #1 (09.20.19), #2 (10.25.19), #3 (11.15.19), #4 (12.16.19), #5 (03/12/2018)  - S/P PRP OD #1 (09.27.19), fill in OD (11.21.19) -- each somewhat limited inferiorly by residual VH  6,7. Hypertensive retinopathy OU  - discussed importance of tight BP control.  - monitor  8. Combined form age related cataract OS-   - The symptoms of cataract, surgical options, and treatments and risks were discussed with patient.  - discussed diagnosis and progression  9. Pseudophakia OD  - s/p CE/IOL OD (Dr. Zenia Resides, 12.11.20)  - beautiful surgery, doing well   Ophthalmic Meds  Ordered this visit:  No orders of the defined types were placed in this encounter.      No follow-ups on file.  There are no Patient Instructions on file for this visit.   Explained the diagnoses, plan, and follow up with the patient and they expressed understanding.  Patient expressed understanding of the importance of proper follow up care.   This document serves as a record of services personally performed by Gardiner Sleeper, MD, PhD. It was created on their behalf by Roselee Nova, COMT. The creation of this record is the provider's dictation and/or activities during the visit.  Electronically signed by: Roselee Nova, COMT 09/02/19 4:28 PM   This document serves as a record of services personally performed by Gardiner Sleeper, MD, PhD. It was created on their behalf by San Jetty. Owens Shark, COT, an ophthalmic technician. The creation of this record is the provider's dictation and/or activities during the visit.    Electronically signed by: San Jetty. Owens Shark, [place] 07.06.2021 4:29 PM   Gardiner Sleeper, M.D., Ph.D. Diseases & Surgery of the Retina and Vitreous [facility]: M myopia  (nearsighted); A astigmatism; H hyperopia (farsighted); P presbyopia; Mrx spectacle prescription;  CTL contact lenses; OD right eye; OS left eye; OU both eyes  XT exotropia; ET esotropia; PEK punctate epithelial keratitis; PEE punctate epithelial erosions; DES dry eye syndrome; MGD meibomian gland dysfunction; ATs artificial tears; PFAT's preservative free artificial tears; [place] nuclear sclerotic cataract; PSC posterior subcapsular cataract; ERM epi-retinal membrane; PVD posterior vitreous detachment; RD retinal detachment; DM diabetes mellitus; DR diabetic retinopathy; NPDR non-proliferative diabetic retinopathy; PDR proliferative diabetic retinopathy; CSME clinically significant macular edema; DME diabetic macular edema; dbh dot blot hemorrhages; CWS cotton wool spot; POAG primary open angle glaucoma; C/D cup-to-disc ratio; HVF humphrey visual field; GVF goldmann visual field; OCT optical coherence tomography; IOP intraocular pressure; BRVO Branch retinal vein occlusion; CRVO central retinal vein occlusion; CRAO central retinal artery occlusion; BRAO branch retinal artery occlusion; RT retinal tear; SB scleral buckle; PPV pars plana vitrectomy; VH Vitreous hemorrhage; PRP panretinal laser photocoagulation; IVK intravitreal kenalog; VMT vitreomacular traction; MH Macular hole;  NVD neovascularization of the disc; NVE neovascularization elsewhere; AREDS age related eye disease study; ARMD age related macular degeneration; POAG primary open angle glaucoma; EBMD epithelial/anterior basement membrane dystrophy; ACIOL anterior chamber intraocular lens; IOL intraocular lens; PCIOL posterior chamber intraocular lens; Phaco/IOL phacoemulsification with intraocular lens placement; [place] photorefractive keratectomy; LASIK laser assisted in situ keratomileusis; HTN hypertension; DM diabetes mellitus; COPD chronic obstructive pulmonary disease

## 2019-09-02 NOTE — Progress Notes (Addendum)
[facility] Note  09/02/2019     CHIEF COMPLAINT Patient presents for Retina Follow Up   HISTORY OF PRESENT ILLNESS: Gloria Gomez is a 49 y.o. female who presents to the clinic today for:   HPI    Retina Follow Up    Patient presents with  Diabetic Retinopathy.  In both eyes.  This started months ago.  Severity is moderate.  Duration of 4 weeks.  Since onset it is stable.  I, the attending physician,  performed the HPI with the patient and updated documentation appropriately.          Comments    49 y/o female pt here for 4 wk f/u for PDR w/mac edema OU.  No change in [place] OD.  VA OS seems a little "darker" over the past few wks, and feels like she can see "blood vessels" in her left eye.  Denies pain, FOL, floaters.  No gtts.  BS 143 2 days ago.  A1C 8.0 3 mos ago.       Last edited by Bernarda Caffey, MD on 09/02/2019  4:28 PM. (History)    pt states she has had some problems with her blood pressure since she was here last, she states she had a lot of swelling in her legs, feet and hands and was put on Lasik, which did seem to help, she feels like those symptoms are now resolved   Referring physician:   HISTORICAL INFORMATION:   Selected notes from the MEDICAL RECORD NUMBER Referred for DM exam    CURRENT MEDICATIONS: No current outpatient medications on file. (Ophthalmic Drugs)   No current facility-administered medications for this visit. (Ophthalmic Drugs)   Current Outpatient Medications (Other)  Medication Sig  . glipiZIDE (GLUCOTROL XL) 10 MG 24 hr tablet Take 1 tablet (10 mg total) by mouth daily with breakfast.  . HYDROcodone-acetaminophen (NORCO/VICODIN) 5-325 MG tablet Take 1-2 tablets by mouth every 6 (six) hours as needed for moderate pain. (Patient not taking: Reported on 05/23/2019)  . INVOKANA 300 MG TABS tablet TAKE 1 TABLET BY MOUTH ONCE DAILY BEFORE BREAKFAST  . JANUMET 50-1000 MG tablet TAKE 1 TABLET BY MOUTH TWICE DAILY WITH  A MEAL  . liraglutide (VICTOZA) 18 MG/3ML SOPN Inject 0.2 mLs (1.2 mg total) into the skin daily. Inject 0.87m SQ daily for 1 week, then increase to 1.256mSQ daily.  . Marland Kitchenosartan (COZAAR) 100 MG tablet Take 1 tablet by mouth once daily  . metFORMIN (GLUCOPHAGE) 1000 MG tablet Take 1 tablet (1,000 mg total) by mouth 2 (two) times daily with a meal.  . metoCLOPramide (REGLAN) 5 MG tablet Take 1 tablet (5 mg total) by mouth 3 (three) times daily with meals as needed for nausea. (Patient not taking: Reported on 05/23/2019)  . metoprolol succinate (TOPROL-XL) 100 MG 24 hr tablet TAKE 1 TABLET BY MOUTH ONCE DAILY TAKE  WITH  OR  IMMEDIATELY  FOLLOWING  A  MEAL  . Multiple Vitamin (MULTIVITAMIN WITH MINERALS) TABS tablet Take 1 tablet by mouth daily.  . Marland KitchenVER THE COUNTER MEDICATION Take 2 tablets by mouth daily. Neu Remedy  . pantoprazole (PROTONIX) 40 MG tablet Take 1 tablet by mouth once daily  . promethazine (PHENERGAN) 12.5 MG tablet 1-2 tabs po q6h prn nausea (Patient not taking: Reported on 05/23/2019)  . rosuvastatin (CRESTOR) 10 MG tablet Take 1 tablet (10 mg total) by mouth daily. (Patient taking differently: Take 10 mg by mouth every other day. )  No current facility-administered medications for this visit. (Other)      REVIEW OF SYSTEMS: ROS    Positive for: Endocrine, Eyes   Negative for: Constitutional, Gastrointestinal, Neurological, Skin, Genitourinary, Musculoskeletal, HENT, Cardiovascular, Respiratory, Psychiatric, Allergic/Imm, Heme/Lymph   Last edited by Matthew Folks, COA on 09/02/2019  3:58 PM. (History)       ALLERGIES Allergies  Allergen Reactions  . Gluten Meal Swelling  . Lisinopril Cough  . Pioglitazone Other (See Comments)    ELEVATED glucoses + worse chronic nausea    PAST MEDICAL HISTORY Past Medical History:  Diagnosis Date  . Abdominal bloating    likely from diab gastroparesis.  Dr. Havery Moros started trial of reglan 03/2019.  Marland Kitchen Benign brain tumor ([place])     Cystic lesion in cerebral aqueduct region with mild hydrocephalus-- stable MRI 02/2016.  Surveillance MRI 05/2017 --dilated cerebral aqueduct related to aqueductal stenosis and subsequent mild hydrocephalus (due to the 11 mm stable cystic lesion in cerebral aqueduct---?congenitial?.  . Cataract    OU  . Diabetic retinopathy ([place])   . Dysmenorrhea    vicodin occ during first 2 days of cycle.  . Gluten intolerance    pt reports she underwent full GI w/u to r/o celiac dz  . Hepatic steatosis    ultrasound 08/2017  . History of adenomatous polyp of colon 04/08/2019   recall Feb 2024  . Hyperlipidemia, mixed   . Hypertensive retinopathy of both eyes   . Insomnia   . Iron deficiency 01/2019   Hb 11.3. Hemoccults neg x 3 03/19/19. EGD and colonoscopy 04/08/19 showed NO cause for IDA.  Pt does have menorrhagia, though, so she'll see her GYN.  started FeSO4 325 qd approx 04/14/19.  . Menorrhagia   . Proliferative diabetic retinopathy of both eyes ([place])    steroid injections 10/2017--improved  . Sensorineural hearing loss of left ear    Sudden left hearing loss summer 2016--no improvement with steroids 01/2015 so brain MRI done by Dr. Redmond Baseman and it showed brain tumor that was determined to be benign.  Pt's hearing not bad enough for hearing aid as of 06/2016.  . Type 2 diabetes with complication (HCC)    +microalbuminuria, diab retpthy, diabetic gastroparesis (gastric emptying study mildly abnl 03/2017).  Recommended lantus 08/2018 but pt declined. Mild microalbuminuria.  Marland Kitchen Uterine fibroid   . White coat hypertension    Past Surgical History:  Procedure Laterality Date  . ANOSCOPY  05/12/2019   Procedure: normal exam, minimal hemorrhoid disease. Hyertrophied anal papila, benign appearing, posterior midline. Surgeon: Leighton Ruff MD  . CHOLECYSTECTOMY  2000  . COLONOSCOPY  04/08/2019   5 adenomas, recall 3 yrs; no cause for IDA found.  Hypertrophied anal papillae->bx showed low grade dysplasia; GI  referred her to colorectal surgeon.  . ESOPHAGOGASTRODUODENOSCOPY  04/08/2019   mild chronic reactive gastritis. H pylori NEG.  No cause for IDA found.  . GAS INSERTION Right 10/31/2018   Procedure: Insertion Of C3F8 Gas;  Surgeon: Bernarda Caffey, MD;  Location: [place];  Service: Ophthalmology;  Laterality: Right;  . GASTRIC EMPTYING SCAN  04/20/2017   Mildly abnormal, particularly the 1st hour of emptying.  Marland Kitchen MEMBRANE PEEL Right 10/31/2018   Procedure: MEMBRANE PEEL;  Surgeon: Bernarda Caffey, MD;  Location: [place];  Service: Ophthalmology;  Laterality: Right;  . PARS PLANA VITRECTOMY Right 04/12/2018   Procedure: Right PARS PLANA VITRECTOMY WITH 25 GAUGE with intravitreal antibiotics;  Surgeon: Bernarda Caffey, MD;  Location: [place];  Service: Ophthalmology;  Laterality: Right;  . PARS PLANA VITRECTOMY Right 10/31/2018   Procedure: PARS PLANA VITRECTOMY WITH 25 GAUGE;  Surgeon: Bernarda Caffey, MD;  Location: [place];  Service: Ophthalmology;  Laterality: Right;  . PHOTOCOAGULATION WITH LASER Right 10/31/2018   Procedure: Photocoagulation With Laser;  Surgeon: Bernarda Caffey, MD;  Location: [place];  Service: Ophthalmology;  Laterality: Right;    FAMILY HISTORY Family History  Problem Relation Age of Onset  . Brain cancer Mother   . Diabetes Father   . Diabetes Maternal Grandmother   . Cataracts Maternal Grandmother   . Cervical cancer Paternal Grandmother   . Colon cancer Maternal Grandfather 64  . Amblyopia Neg Hx   . Blindness Neg Hx   . Glaucoma Neg Hx   . Macular degeneration Neg Hx   . Retinal detachment Neg Hx   . Strabismus Neg Hx   . Retinitis pigmentosa Neg Hx   . Esophageal cancer Neg Hx   . Stomach cancer Neg Hx   . Rectal cancer Neg Hx     SOCIAL HISTORY Social History   Tobacco Use  . Smoking status: Never Smoker  . Smokeless tobacco: Never Used  Vaping Use  . Vaping Use: Never used  Substance Use Topics  . Alcohol use: No  . Drug use: No         OPHTHALMIC EXAM:  Base  Eye Exam    Visual Acuity (Snellen - Linear)      Right Left   Dist [place] 20/25 -2    Dist cc  20/30 -2   Dist ph [place] NI    Dist ph cc  20/25 -2       Tonometry (Tonopen, 4:21 PM)      Right Left   Pressure 14 14       Pupils      Dark Light Shape React APD   Right 3 2 Round Brisk None   Left 3 2 Round Brisk None       Visual Fields (Counting fingers)      Left Right    Full Full       Extraocular Movement      Right Left    Full, Ortho Full, Ortho       Neuro/Psych    Oriented x3: Yes   Mood/Affect: Normal       Dilation    Both eyes: 1.0% Mydriacyl, 2.5% Phenylephrine @ 4:21 PM        Slit Lamp and Fundus Exam    Slit Lamp Exam      Right Left   Lids/Lashes Dermatochalasis - upper lid, mild Meibomian gland dysfunction, Telangiectasia Dermatochalasis - upper lid, Meibomian gland dysfunction, Telangiectasia   Conjunctiva/Sclera White and quiet, sutures intact White and quiet   Cornea Clear, trace Punctate epithelial erosions, well healed temporal cataract wounds Trace Punctate epithelial erosions   Anterior Chamber Deep and quiet Deep and quiet   Iris Round and dilated, No NVI Round and dilated, No NVI   Lens PC IOL in good position, 1-2+ Posterior capsular opacification, PC folds 2+ Nuclear sclerosis, 2+ Cortical cataract   Vitreous post vitrectomy, trace pigment Vitreous syneresis, +RBC in anterior vitreous improving, VH clearing and settling inferiorly, vitreous condensations, residual blood clots settling inferiorly and clearing       Fundus Exam      Right Left   Disc mild Pallor, Sharp rim Pink and sharp, Compact   C/D Ratio 0.2 0.0   Macula Flat, blunted foveal reflex,  central cystic changes -- slightly improved, scattered MA/exudate, cluster of MA and exudates greatest ST to fovea -- persistent Flat, blunted foveal reflex, focal MA temporal macula w/ +cystic changes, scattered punctate exudates - improving, trace ERM    Vessels mild Vascular attenuation,  Tortuous Vascular attenuation, mild Tortuousity   Periphery Attached, tractional fibrosis in SN quadrant removed, scattered DBH greatest superiorly, 360 PRP, good laser fill in 360 attached, 360 PRP scars good posterior fill in, inferior retina obscured by settling VH, clot          IMAGING AND PROCEDURES  Imaging and Procedures for _0 @  OCT, Retina - OU - Both Eyes       Right Eye Quality was good. Central Foveal Thickness: 349. Progression has improved. Findings include abnormal foveal contour, epiretinal membrane, no SRF, intraretinal hyper-reflective material, vitreous traction, preretinal fibrosis (Mild interval improvement in IRF).   Left Eye Quality was good. Central Foveal Thickness: 289. Progression has improved. Findings include normal foveal contour, intraretinal fluid, no SRF (Mild improvement in IRF temporal macula; Mild ERM ).   Notes *Images captured and stored on drive  Diagnosis / Impression:  DME OU OD: Mild interval improvement in IRF OS: Mild improvement in IRF temporal macula; Mild ERM    Clinical management:  See below  Abbreviations: NFP - Normal foveal profile. CME - cystoid macular edema. PED - pigment epithelial detachment. IRF - intraretinal fluid. SRF - subretinal fluid. EZ - ellipsoid zone. ERM - epiretinal membrane. ORA - outer retinal atrophy. ORT - outer retinal tubulation. SRHM - subretinal hyper-reflective material         Intravitreal Injection, Pharmacologic Agent - OD - Right Eye       Time Out 09/02/2019. 4:35 PM. Confirmed correct patient, procedure, site, and patient consented.   Anesthesia Topical anesthesia was used. Anesthetic medications included Lidocaine 2%, Proparacaine 0.5%.   Procedure Preparation included 5% betadine to ocular surface, eyelid speculum. A supplied needle was used.   Injection:  1.25 mg Bevacizumab (AVASTIN) SOLN   NDC: 15176-160-73, Lot: [id number] , Expiration date: 10/22/2019   Route:  Intravitreal, Site: Right Eye, Waste: 0 mg  Post-op Post injection exam found visual acuity of at least counting fingers. The patient tolerated the procedure well. There were no complications. The patient received written and verbal post procedure care education.        Focal Laser - OS - Left Eye       LASER PROCEDURE NOTE  Diagnosis:   Diabetic macular edema, LEFT EYE  Procedure:  Focal laser photocoagulation using slit lamp laser, LEFT EYE  Anesthesia:  Topical  Surgeon: Bernarda Caffey, MD, PhD   Informed consent obtained, operative eye marked, and time out performed prior to initiation of laser.   Lumenis [id number] Focal/Grid laser Power: 100 mW Duration: 50 msec  Spot size: 100 microns  # spots: 45 spots placed to MAs temporal macula  Complications: None.  RTC: 4 wks  Patient tolerated the procedure well and received written and verbal post-procedure care information/education.                  ASSESSMENT/PLAN:    ICD-10-CM   1. Proliferative diabetic retinopathy of both eyes with macular edema associated with type 2 diabetes mellitus (HCC)  W54.6270 Intravitreal Injection, Pharmacologic Agent - OD - Right Eye    Focal Laser - OS - Left Eye    Bevacizumab (AVASTIN) SOLN 1.25 mg  2. Retinal edema  H35.81 OCT, Retina - OU - Both  Eyes  3. Vitreous hemorrhage of left eye (HCC)  H43.12   4. Right endophthalmia  H44.001   5. Vitreous hemorrhage, right eye ([place])  H43.11   6. Essential hypertension  I10   7. Hypertensive retinopathy of both eyes  H35.033   8. Combined forms of age-related cataract of left eye  H25.812   9. Pseudophakia of right eye  Z96.1     1,2.  Proliferative diabetic retinopathy w/ DME, OU  - HbA1c 8.3% (12.2.20), 8.6% (06.30.20), 8.4% (02.14.20)  - s/p IVA OD #1 9.20.19, #2 (10.25.19), #3 (11.15.19), #4 (12.17.19), #5 (01.14.20), #6 (2.11.20), #7 (05.29.20), #8 (08.19.20), #9 (10.30.20), #10 (12.09.20), #11 (01.11.21), #12 (02.15.21),  #13 (03.23.21), #14 (04.20.21), #15 (06.08.21)  - s/p IVA OS #1 9.27.19, #2 (10.25.19), #3 (11.15.19), #4 (12.17.19), #5 (01.14.20), #6 (2.11.20), #7 (04.26.20), #8 (05.29.20), #9 (06.26.20), #10 (08.05.20), #11 (11.11.20), #12 (12.09.20), #13 (01.11.21), #14 (02.15.21), #15 (03.23.21), #16 (04.20.21), #17 (06.08.21)  - S/P PRP OS (09.20.19), (5.19.20), (08.19.20), (04.28.21)  - S/P PRP OD (9.27.19 and 11.21.19), fill-in (04.14.20) (09.03.20, surgery)  - FA (9.20.19) shows +NVE OU and leaking MA and capillary nonperfusion  - repeat FA 11.15.19 shows NV regressing OU  - pre-op: OD w/ VA stable at 20/25, but there is some preretinal fibrosis / tractional membranes just superior to disc and mild central DME  - s/p 25g PPV+MP+10% C3F8 gas OD (09.03.20) -- ERM/PRF removal OD  - BCVA 20/25-2 OD (stable)             - fibrosis/ERM stably improved; retina attached  - OCT shows OD: Mild interval improvement in IRF; OS: Mild improvement in IRF temporal macula; Mild ERM  - exam shows focal Mas OS amenable to focal laser  - recommend IVA #16 OD and focal laser OS today, (07.06.21)  - pt wishes to proceed with procedures  - RBA of procedure discussed, questions answered  - informed consents obtained   - see procedure notes  - Avastin informed consent form signed and scanned on 01.11.2021 (OU)  - f/u 4 weeks -- DFE, OCT  3. Vitreous hemorrhage OS -- improved  - recurrent VH, onset 11.11.20  - initial onset 04.24.20  - etiology: secondary to PDR as described above (no RT/RD on exam)  - s/p PRP OS (9.20.19), (05.19.20), (08.19.20), (04.28.21)  - s/p IVA OS on 4.26.20, 5.29.20, 6.26.20, 8.5.20,11.11.20, 12.06.20 and so on as above  - today, further improvement in diffuse VH post-IVA OS -- pt reports improvement in floaters  - BCVA 20/25   4. History of Endophthalmitis OD  - s/p IVA OU 04/09/2018  - s/p 25g PPV w/ intravitreal vanc, ceftaz and cefepime OD, 2.14.2020  - s/p intravitreal tap / vanc and  ceflaz injections (02.16.20)             - gram stain (2.14.20) shows G+ cocci, WBCs mostly PMNs;   - repeat gram stain from t/i (2.16.20) -- no organisms, just WBCs             - cultures from vitreous grew rare Staph warneri; cultures from t/i -- no growth             - doing well, BCVA 20/30             - inflammation/posterior debris resolved             - IOP 14 off Brimonidine  - completed po pred taper -- caused significant elevations in BG  - monitor  5. History of Vitreous Hemorrhage OD -- cleared from PPV x2 for endophthalmitis and ERM/preretinal fibrosis  - secondary to PDR as described below  - S/P IVA OD #1 (09.20.19), #2 (10.25.19), #3 (11.15.19), #4 (12.16.19), #5 (03/12/2018)  - S/P PRP OD #1 (09.27.19), fill in OD (11.21.19) -- each somewhat limited inferiorly by residual VH  6,7. Hypertensive retinopathy OU  - discussed importance of tight BP control.  - monitor  8. Combined form age related cataract OS-   - The symptoms of cataract, surgical options, and treatments and risks were discussed with patient.  - discussed diagnosis and progression  9. Pseudophakia OD  - s/p CE/IOL OD (Dr. Zenia Resides, 12.11.20)  - beautiful surgery, doing well   Ophthalmic Meds Ordered this visit:  Meds ordered this encounter  Medications  . Bevacizumab (AVASTIN) SOLN 1.25 mg       Return in about 4 weeks (around 09/30/2019) for f/u PDR OU, DFE, OCT.  There are no Patient Instructions on file for this visit.   Explained the diagnoses, plan, and follow up with the patient and they expressed understanding.  Patient expressed understanding of the importance of proper follow up care.   This document serves as a record of services personally performed by Gardiner Sleeper, MD, PhD. It was created on their behalf by Roselee Nova, COMT. The creation of this record is the provider's dictation and/or activities during the visit.  Electronically signed by: Roselee Nova, COMT 09/02/19 10:46 PM    This document serves as a record of services personally performed by Gardiner Sleeper, MD, PhD. It was created on their behalf by San Jetty. Owens Shark, COT, an ophthalmic technician. The creation of this record is the provider's dictation and/or activities during the visit.    Electronically signed by: San Jetty. Owens Shark, [place] 07.06.2021 10:46 PM   Gardiner Sleeper, M.D., Ph.D. Diseases & Surgery of the Retina and Vitreous [facility]  I have reviewed the above documentation for accuracy and completeness, and I agree with the above. Gardiner Sleeper, M.D., Ph.D. 09/02/19 10:50 PM   Abbreviations: M myopia (nearsighted); A astigmatism; H hyperopia (farsighted); P presbyopia; Mrx spectacle prescription;  CTL contact lenses; OD right eye; OS left eye; OU both eyes  XT exotropia; ET esotropia; PEK punctate epithelial keratitis; PEE punctate epithelial erosions; DES dry eye syndrome; MGD meibomian gland dysfunction; ATs artificial tears; PFAT's preservative free artificial tears; [place] nuclear sclerotic cataract; PSC posterior subcapsular cataract; ERM epi-retinal membrane; PVD posterior vitreous detachment; RD retinal detachment; DM diabetes mellitus; DR diabetic retinopathy; NPDR non-proliferative diabetic retinopathy; PDR proliferative diabetic retinopathy; CSME clinically significant macular edema; DME diabetic macular edema; dbh dot blot hemorrhages; CWS cotton wool spot; POAG primary open angle glaucoma; C/D cup-to-disc ratio; HVF humphrey visual field; GVF goldmann visual field; OCT optical coherence tomography; IOP intraocular pressure; BRVO Branch retinal vein occlusion; CRVO central retinal vein occlusion; CRAO central retinal artery occlusion; BRAO branch retinal artery occlusion; RT retinal tear; SB scleral buckle; PPV pars plana vitrectomy; VH Vitreous hemorrhage; PRP panretinal laser photocoagulation; IVK intravitreal kenalog; VMT vitreomacular traction; MH Macular hole;  NVD  neovascularization of the disc; NVE neovascularization elsewhere; AREDS age related eye disease study; ARMD age related macular degeneration; POAG primary open angle glaucoma; EBMD epithelial/anterior basement membrane dystrophy; ACIOL anterior chamber intraocular lens; IOL intraocular lens; PCIOL posterior chamber intraocular lens; Phaco/IOL phacoemulsification with intraocular lens placement; PRK photorefractive keratectomy; LASIK laser assisted in situ keratomileusis; HTN hypertension; DM diabetes  mellitus; COPD chronic obstructive pulmonary disease

## 2019-09-10 ENCOUNTER — Other Ambulatory Visit: Payer: Self-pay | Admitting: Family Medicine

## 2019-09-10 ENCOUNTER — Telehealth: Payer: Self-pay

## 2019-09-10 NOTE — Telephone Encounter (Signed)
Patient was given new Rx for Invokana 300mg  09/10/19 for 90 d supply. She was last seen 05/23/19 for f/u [facility] along with labs. In results, PCP noted "HbA1c stable at 8.3%. I recommend she start a once daily injection of insulin.I also recommend she stop her glipizide-->this med is not doing her any good anymore. She should otherwise remain on all her other meds."  Please clarify if patient should still be taking this med.

## 2019-09-10 NOTE — Telephone Encounter (Signed)
Yes she should still be taking invokana

## 2019-09-10 NOTE — Telephone Encounter (Signed)
PA sent via covermymed on 09/10/19   Key: [id number]   Medication: Invokana 300mg    Dx: R83.818   Per Dr. Anitra Lauth pt has tried and failed N/A   Waiting for response.

## 2019-09-11 NOTE — Telephone Encounter (Signed)
Recent office notes and lab results faxed to [facility]. Patient notified of status update. Waiting for appeal decision

## 2019-09-11 NOTE — Telephone Encounter (Signed)
PA denied.

## 2019-09-14 IMAGING — US US ABDOMEN COMPLETE
1 series · 14 of 25 positions shown · non-contrast
Comparison: None.

CLINICAL DATA: Abdominal bloating for 1 year

EXAM:
ABDOMEN ULTRASOUND COMPLETE

[Series 1: us abdomen complete · 0.22mm/px · 14 of 57 slices shown]
[im 1/57]
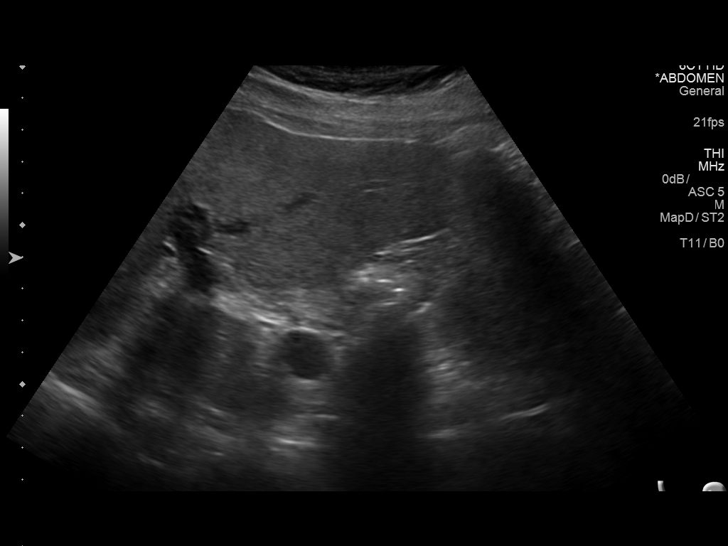
[im 5/57]
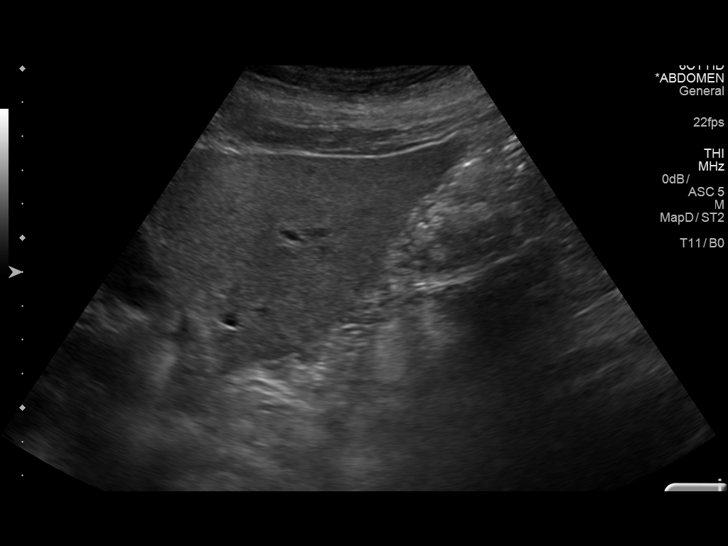
[im 10/57]
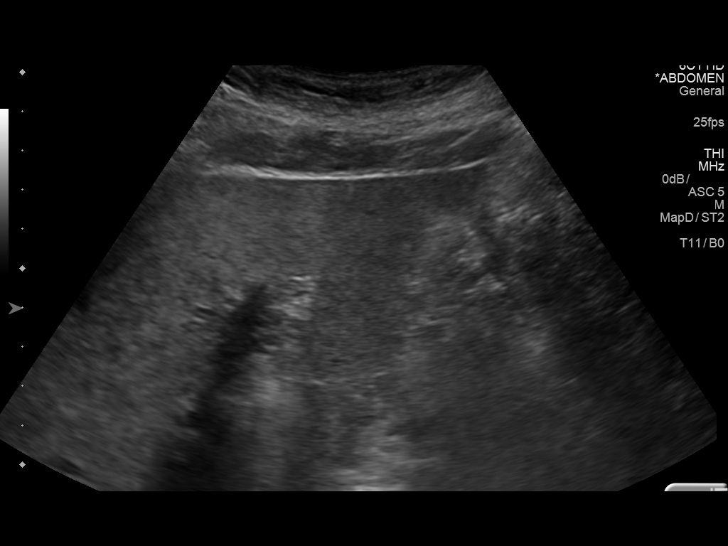
[im 15/57]
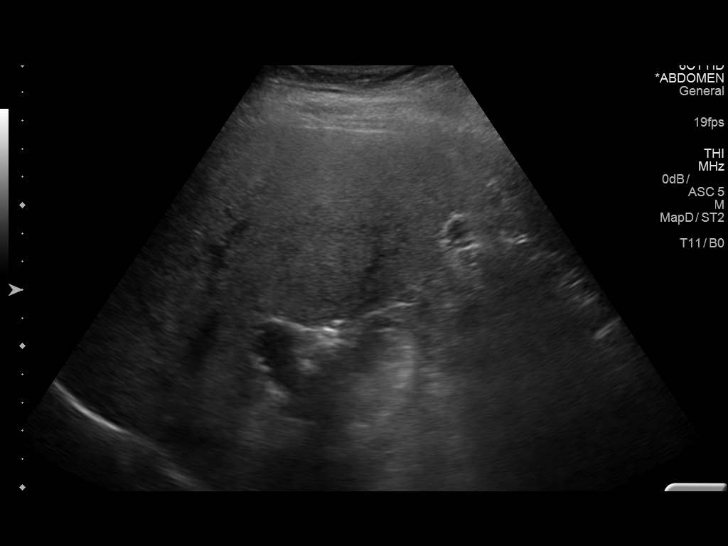
[im 19/57]
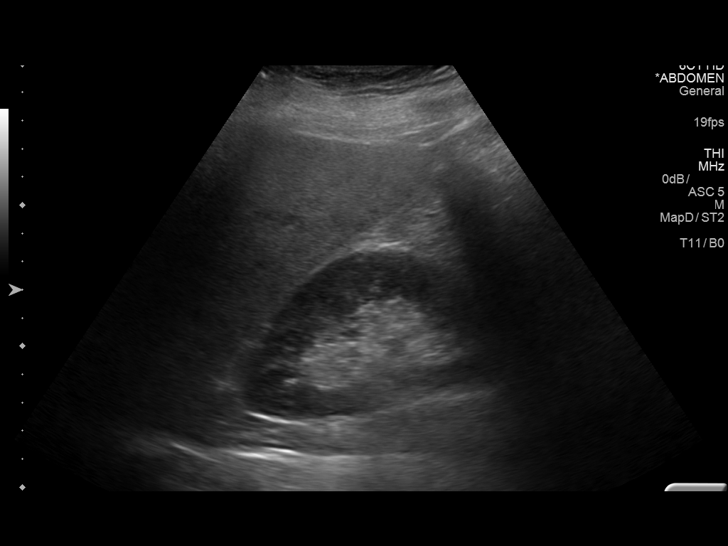
[im 22/57]
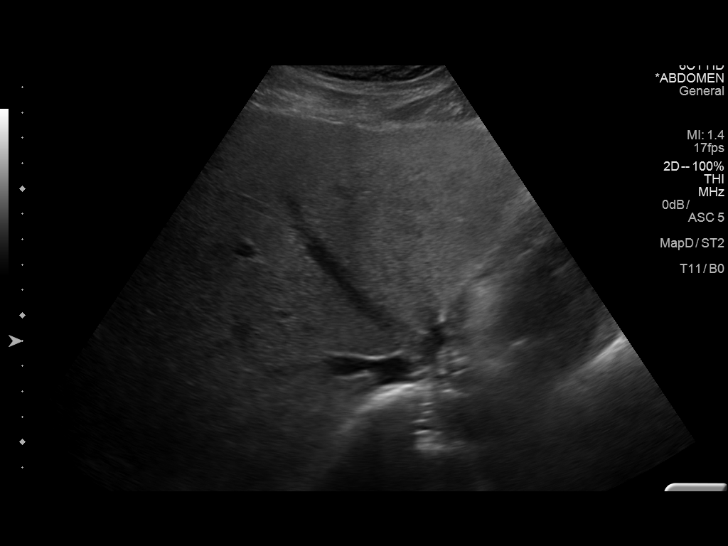
[im 26/57]
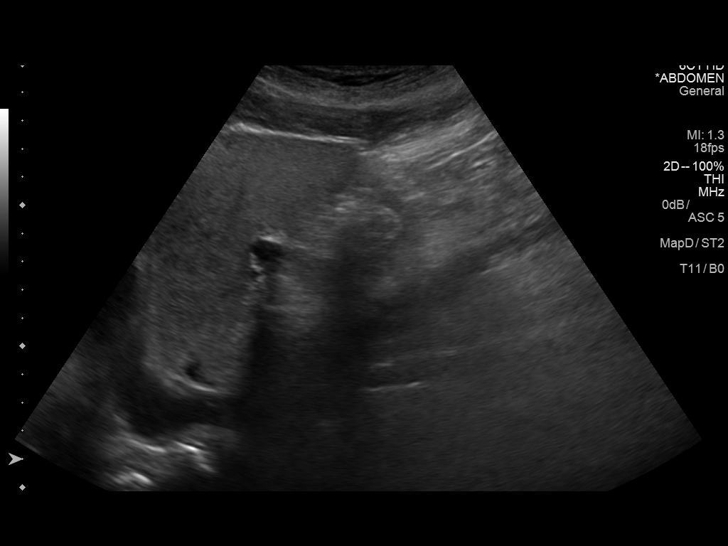
[im 31/57]
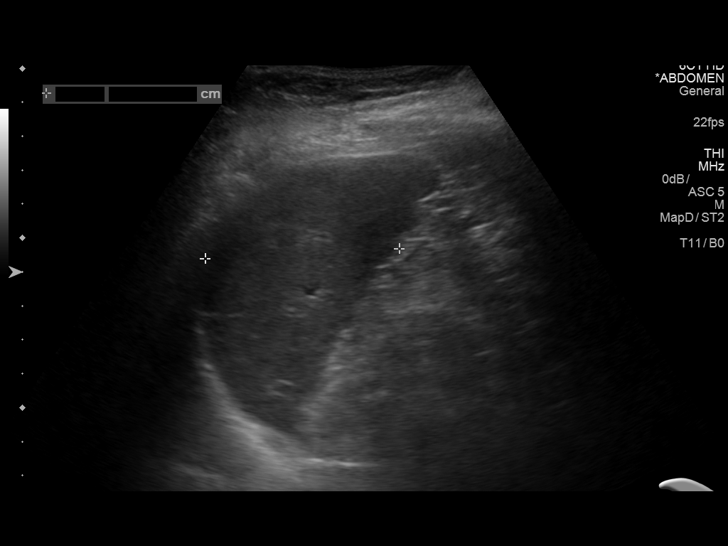
[im 36/57]
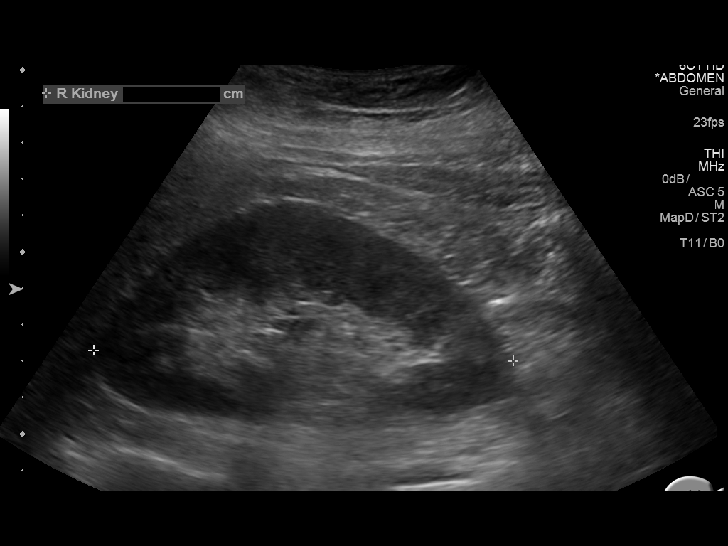
[im 38/57]
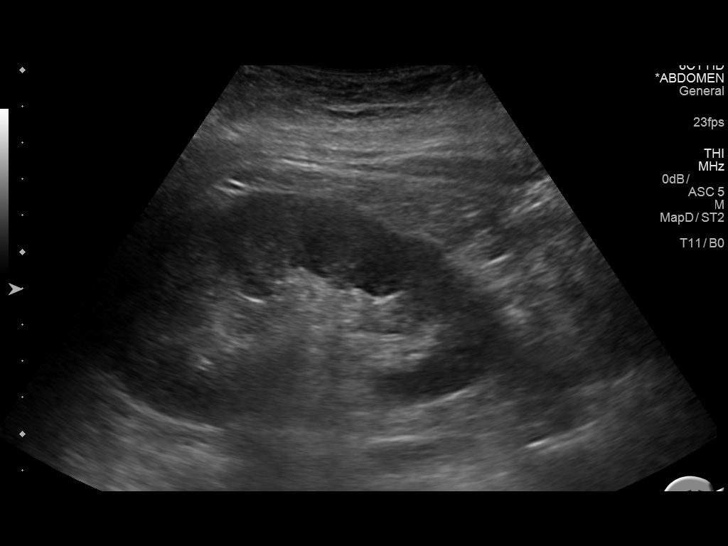
[im 43/57]
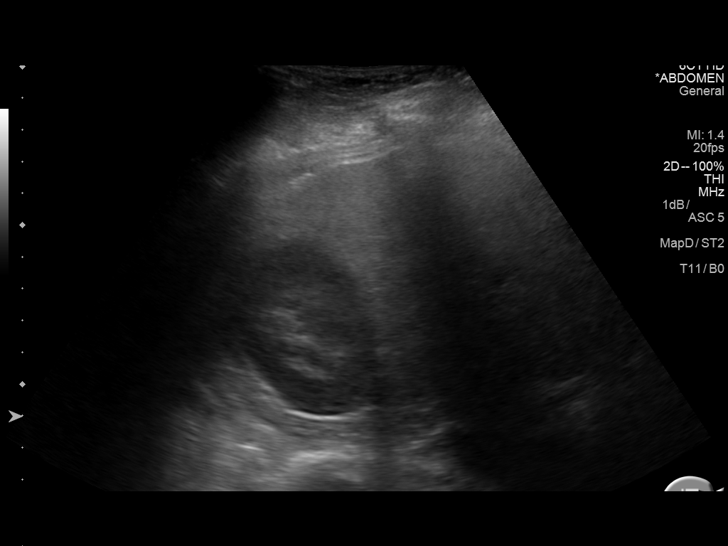
[im 47/57]
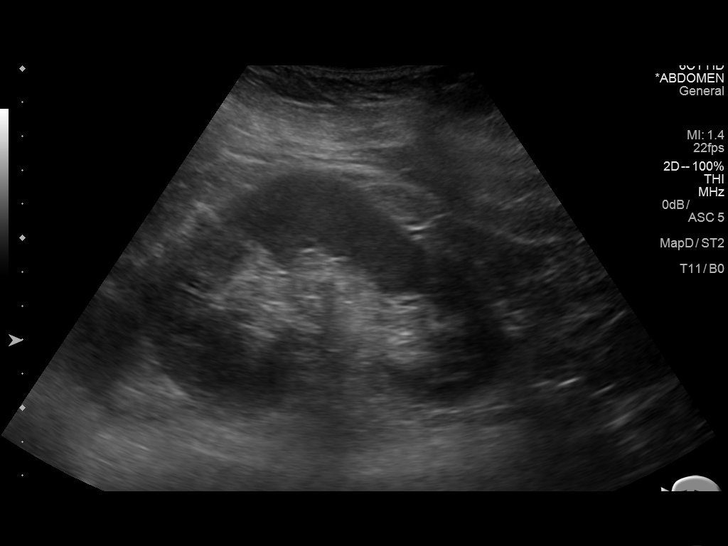
[im 52/57]
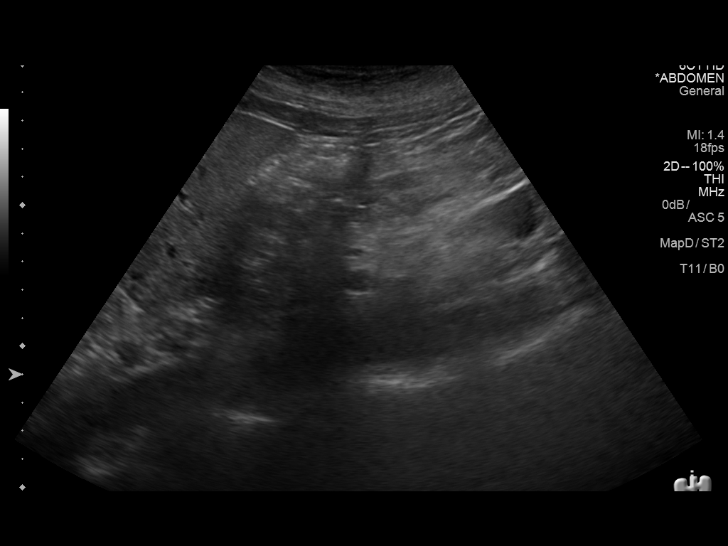
[im 57/57]
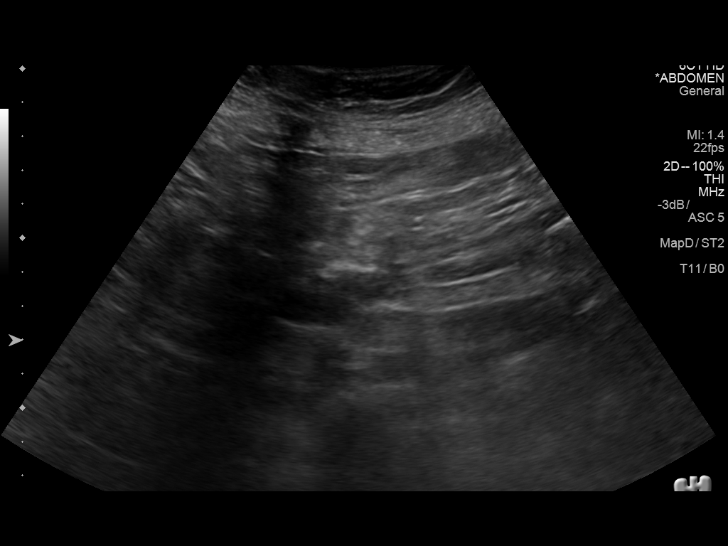

[14 of 25 positions shown; findings below may reference images not displayed]

FINDINGS: Gallbladder: Prior cholecystectomy.

Common bile duct: Diameter: 3.2 mm

Liver: No focal lesion identified. Increased hepatic parenchymal
echogenicity. Portal vein is patent on color Doppler imaging with
normal direction of blood flow towards the liver.

IVC: No abnormality visualized.

Pancreas: Visualized portion unremarkable.

Spleen: Size and appearance within normal limits.

Right Kidney: Length: 11.5 cm. Echogenicity within normal limits. No
mass or hydronephrosis visualized.

Left Kidney: Length: 10.7 cm. Echogenicity within normal limits. No
mass or hydronephrosis visualized.

Abdominal aorta: No aneurysm visualized.

Other findings: None.
IMPRESSION: 1. Prior cholecystectomy.
2. Increased hepatic echogenicity as can be seen with hepatic
steatosis.

## 2019-09-17 NOTE — Telephone Encounter (Signed)
Denial for coverage of Invokana. Please advise of any other med suggestions

## 2019-09-22 ENCOUNTER — Telehealth (INDEPENDENT_AMBULATORY_CARE_PROVIDER_SITE_OTHER): Payer: BLUE CROSS/BLUE SHIELD | Admitting: Family Medicine

## 2019-09-22 ENCOUNTER — Other Ambulatory Visit: Payer: Self-pay

## 2019-09-22 ENCOUNTER — Encounter: Payer: Self-pay | Admitting: Family Medicine

## 2019-09-22 VITALS — BP 130/84 | Wt 167.0 lb

## 2019-09-22 DIAGNOSIS — R609 Edema, unspecified: Secondary | ICD-10-CM

## 2019-09-22 DIAGNOSIS — K760 Fatty (change of) liver, not elsewhere classified: Secondary | ICD-10-CM | POA: Diagnosis not present

## 2019-09-22 DIAGNOSIS — E11319 Type 2 diabetes mellitus with unspecified diabetic retinopathy without macular edema: Secondary | ICD-10-CM | POA: Diagnosis not present

## 2019-09-22 DIAGNOSIS — N921 Excessive and frequent menstruation with irregular cycle: Secondary | ICD-10-CM

## 2019-09-22 DIAGNOSIS — D500 Iron deficiency anemia secondary to blood loss (chronic): Secondary | ICD-10-CM

## 2019-09-22 DIAGNOSIS — I10 Essential (primary) hypertension: Secondary | ICD-10-CM

## 2019-09-22 DIAGNOSIS — R7401 Elevation of levels of liver transaminase levels: Secondary | ICD-10-CM

## 2019-09-22 DIAGNOSIS — E7800 Pure hypercholesterolemia, unspecified: Secondary | ICD-10-CM | POA: Diagnosis not present

## 2019-09-22 NOTE — Progress Notes (Signed)
Virtual Visit via Video Note  I connected with pt on 09/22/19 at  1:00 PM EDT by a video enabled telemedicine application and verified that I am speaking with the correct person using two identifiers.  Location patient: home Location provider:work or home office Persons participating in the virtual visit: patient, provider  I discussed the limitations of evaluation and management by telemedicine and the availability of in person appointments. The patient expressed understanding and agreed to proceed.   HPI: 49 y/o WF being seen today for 4 mo f/u DM, HTN, HLD.  A/P as of last visit: "1) DM 2, poor control, with DPN and gastroparesis. Improved glucoses of late since she is more active. Eye MD is doing excellent job with her. A1c and urine microalb/cr--future. Lytes/cr future. I rx'd her metformin 1000 mg bid to take until her janumet comes in. She said she would consider insulin if A1c rising.  2) HTN: controlled on losartan and toprol.  No changes. BMET-future.  3) HLD: tolerating statin every OTHER day. Lipids fine except mild elev trigs 01/2019. Hepatic panel future.  Rpeat lipids 3-6 mo.  4) IDA: suspect due to menorrhagia.  EGD/colonoscopy 2020 w/out any culprit identified. Referred her to gynecologist for further eval. Start FeSO4 325mg  qd. CBC with iron panel--future."  INTERIM HX: Some fluid retention and bloating lately, some nausea and vomiting last week. Ankle edema bilat, has improved signif in the last 3-5d.   Has had activity-limiting physical fatigue primarily assoc with joint stiffness and pain (most signif areas of pain is both shoulders). Statin holiday in the past did not help this. She has been on periods of extended bed rest due to retinopthy and eye procedures over the last 6 mo so some of this is attributable to that.  Insurer not covering invokana last 10d. Victoza and janumet are her current meds. Home glucoses "about the same".  Recent days  fasting 118-140. Some "higher" the week before. Only sparse info recalled by pt. Next eye MD visit 09/30/19: R eye injection and L eye laser treatment was done at last eye MD visit. She describes this problem as "somewhat stable" right now.  HTN: yesterday bp 130/84. Last week 150s/88 when she had signif fluid retention.  GYN f/u for her menorrhagia: last Hb remained low, she is taking iron tab. Pap was normal.  She is scheduled to have hysterosalpingogram 10/2019. Last period was 06/2019, still very heavy flow.  No menses this month or last.  ROS: See pertinent positives and negatives per HPI.  Past Medical History:  Diagnosis Date  . Abdominal bloating    likely from diab gastroparesis.  Dr. Havery Moros started trial of reglan 03/2019.  Marland Kitchen Benign brain tumor ([place])    Cystic lesion in cerebral aqueduct region with mild hydrocephalus-- stable MRI 02/2016.  Surveillance MRI 05/2017 --dilated cerebral aqueduct related to aqueductal stenosis and subsequent mild hydrocephalus (due to the 11 mm stable cystic lesion in cerebral aqueduct---?congenitial?.  . Cataract    OU  . Dysmenorrhea    vicodin occ during first 2 days of cycle.  . Gluten intolerance    pt reports she underwent full GI w/u to r/o celiac dz  . Hepatic steatosis    ultrasound 08/2017. Hx of very mild elevation of ALT  . History of adenomatous polyp of colon 04/08/2019   recall Feb 2024  . Hyperlipidemia, mixed   . Hypertensive retinopathy of both eyes   . Insomnia   . Iron deficiency 01/2019   Hb  11.3. Hemoccults neg x 3 03/19/19. EGD and colonoscopy 04/08/19 showed NO cause for IDA.  Pt does have menorrhagia, though, so she'll see her GYN.  started FeSO4 325 qd approx 04/14/19.  . Menorrhagia    resulting in [place] 2021  . Proliferative diabetic retinopathy of both eyes ([place])    steroid injections 10/2017--improved  . Sensorineural hearing loss of left ear    Sudden left hearing loss summer 2016--no improvement with steroids 01/2015  so brain MRI done by Dr. Redmond Baseman and it showed brain tumor that was determined to be benign.  Pt's hearing not bad enough for hearing aid as of 06/2016.  . Type 2 diabetes with complication ([facility])    +microalbuminuria, diab retpthy, diabetic gastroparesis (gastric emptying study mildly abnl 03/2017).  Recommended lantus 08/2018 but pt declined. Mild microalbuminuria.  Marland Kitchen Uterine fibroid   . White coat hypertension     Past Surgical History:  Procedure Laterality Date  . ANOSCOPY  05/12/2019   Procedure: normal exam, minimal hemorrhoid disease. Hyertrophied anal papila, benign appearing, posterior midline. Surgeon: Leighton Ruff MD  . CHOLECYSTECTOMY  2000  . COLONOSCOPY  04/08/2019   5 adenomas, recall 3 yrs; no cause for IDA found.  Hypertrophied anal papillae->bx showed low grade dysplasia; GI referred her to colorectal surgeon.  . ESOPHAGOGASTRODUODENOSCOPY  04/08/2019   mild chronic reactive gastritis. H pylori NEG.  No cause for IDA found.  . GAS INSERTION Right 10/31/2018   Procedure: Insertion Of C3F8 Gas;  Surgeon: Bernarda Caffey, MD;  Location: [place];  Service: Ophthalmology;  Laterality: Right;  . GASTRIC EMPTYING SCAN  04/20/2017   Mildly abnormal, particularly the 1st hour of emptying.  Marland Kitchen MEMBRANE PEEL Right 10/31/2018   Procedure: MEMBRANE PEEL;  Surgeon: Bernarda Caffey, MD;  Location: [place];  Service: Ophthalmology;  Laterality: Right;  . PARS PLANA VITRECTOMY Right 04/12/2018   Procedure: Right PARS PLANA VITRECTOMY WITH 25 GAUGE with intravitreal antibiotics;  Surgeon: Bernarda Caffey, MD;  Location: [place];  Service: Ophthalmology;  Laterality: Right;  . PARS PLANA VITRECTOMY Right 10/31/2018   Procedure: PARS PLANA VITRECTOMY WITH 25 GAUGE;  Surgeon: Bernarda Caffey, MD;  Location: [place];  Service: Ophthalmology;  Laterality: Right;  . PHOTOCOAGULATION WITH LASER Right 10/31/2018   Procedure: Photocoagulation With Laser;  Surgeon: Bernarda Caffey, MD;  Location: [place];  Service: Ophthalmology;   Laterality: Right;    Family History  Problem Relation Age of Onset  . Brain cancer Mother   . Diabetes Father   . Diabetes Maternal Grandmother   . Cataracts Maternal Grandmother   . Cervical cancer Paternal Grandmother   . Colon cancer Maternal Grandfather 96  . Amblyopia Neg Hx   . Blindness Neg Hx   . Glaucoma Neg Hx   . Macular degeneration Neg Hx   . Retinal detachment Neg Hx   . Strabismus Neg Hx   . Retinitis pigmentosa Neg Hx   . Esophageal cancer Neg Hx   . Stomach cancer Neg Hx   . Rectal cancer Neg Hx      Current Outpatient Medications:  .  HYDROcodone-acetaminophen (NORCO/VICODIN) 5-325 MG tablet, Take 1-2 tablets by mouth every 6 (six) hours as needed for moderate pain., Disp: 40 tablet, Rfl: 0 .  JANUMET 50-1000 MG tablet, TAKE 1 TABLET BY MOUTH TWICE DAILY WITH A MEAL, Disp: 180 tablet, Rfl: 0 .  liraglutide (VICTOZA) 18 MG/3ML SOPN, Inject 0.2 mLs (1.2 mg total) into the skin daily. Inject 0.6mg  SQ daily for 1  week, then increase to 1.2mg  SQ daily., Disp: 15 pen, Rfl: 3 .  losartan (COZAAR) 100 MG tablet, Take 1 tablet by mouth once daily, Disp: 90 tablet, Rfl: 1 .  metoCLOPramide (REGLAN) 5 MG tablet, Take 1 tablet (5 mg total) by mouth 3 (three) times daily with meals as needed for nausea., Disp: 60 tablet, Rfl: 1 .  metoprolol succinate (TOPROL-XL) 100 MG 24 hr tablet, TAKE 1 TABLET BY MOUTH ONCE DAILY TAKE  WITH  OR  IMMEDIATELY  FOLLOWING  A  MEAL, Disp: 30 tablet, Rfl: 0 .  Multiple Vitamin (MULTIVITAMIN WITH MINERALS) TABS tablet, Take 1 tablet by mouth daily., Disp: , Rfl:  .  OVER THE COUNTER MEDICATION, Take 2 capsules by mouth daily. Neu Remedy , Disp: , Rfl:  .  pantoprazole (PROTONIX) 40 MG tablet, Take 1 tablet by mouth once daily, Disp: 90 tablet, Rfl: 0 .  promethazine (PHENERGAN) 12.5 MG tablet, 1-2 tabs po q6h prn nausea, Disp: 30 tablet, Rfl: 1 .  rosuvastatin (CRESTOR) 10 MG tablet, Take 1 tablet (10 mg total) by mouth daily. (Patient taking  differently: Take 10 mg by mouth every other day. ), Disp: 30 tablet, Rfl: 4 .  INVOKANA 300 MG TABS tablet, TAKE 1 TABLET BY MOUTH ONCE DAILY BEFORE BREAKFAST (Patient not taking: Reported on 09/22/2019), Disp: 90 tablet, Rfl: 0  EXAM:  VITALS per patient if applicable: BP (!) 016/01 (BP Location: Left Arm, Patient Position: Sitting, Cuff Size: Normal)   Wt 167 lb (75.8 kg)   BMI 29.58 kg/m    GENERAL: alert, oriented, appears well and in no acute distress  HEENT: atraumatic, conjunttiva clear, no obvious abnormalities on inspection of external nose and ears  NECK: normal movements of the head and neck  LUNGS: on inspection no signs of respiratory distress, breathing rate appears normal, no obvious gross SOB, gasping or wheezing  CV: no obvious cyanosis  MS: moves all visible extremities without noticeable abnormality  PSYCH/NEURO: pleasant and cooperative, no obvious depression or anxiety, speech and thought processing grossly intact  LABS: none today  Lab Results  Component Value Date   TSH 1.37 06/23/2019   Lab Results  Component Value Date   WBC 11.0 06/23/2019   HGB 12.8 06/23/2019   HGB 13.6 06/23/2019   HCT 41 06/23/2019   MCV 84.6 05/28/2019   PLT 580 (A) 06/23/2019   Lab Results  Component Value Date   IRON 70 05/28/2019   TIBC 410 05/28/2019   FERRITIN 22 05/28/2019    Lab Results  Component Value Date   CREATININE 0.57 05/28/2019   BUN 11 05/28/2019   NA 136 05/28/2019   K 4.2 05/28/2019   CL 97 05/28/2019   CO2 27 05/28/2019   Lab Results  Component Value Date   ALT 50 (H) 05/28/2019   AST 34 05/28/2019   ALKPHOS 65 05/28/2019   BILITOT 0.2 05/28/2019   Lab Results  Component Value Date   CHOL 99 01/29/2019   Lab Results  Component Value Date   HDL 33 (L) 01/29/2019   Lab Results  Component Value Date   LDLCALC 39 01/29/2019   Lab Results  Component Value Date   TRIG 196 (H) 01/29/2019   Lab Results  Component Value Date    CHOLHDL 3.0 01/29/2019   Lab Results  Component Value Date   HGBA1C 8.3 (H) 05/28/2019    ASSESSMENT AND PLAN:  Discussed the following assessment and plan:  Type 2 diabetes mellitus with retinopathy  of both eyes, without long-term current use of insulin, macular edema presence unspecified, unspecified retinopathy severity ([place]) - Plan: Hemoglobin A1c, Comprehensive metabolic panel  Essential hypertension - Plan: Comprehensive metabolic panel  Hypercholesterolemia - Plan: Lipid panel, Comprehensive metabolic panel  Hepatic steatosis - Plan: Comprehensive metabolic panel  Elevated transaminase level - Plan: Comprehensive metabolic panel  IDA: continuing iron, in the midst of GYN eval for menorrhagia. GI eval normal.  PLAN: No new mgmt today. Check future labs. Will look into possible substitute for her invokana since insurer denied coverage despite prior auth submission. BPs pretty stable. Recent fluid retention possibly hormone related b/c she tends to get this prob with menses. Continue with GYN w/u for her menorrhagia, cont iron supp and periodic Hb monitoring via GYn. I'll check Hb at next f/u with me.  -we discussed possible serious and likely etiologies, options for evaluation and workup, limitations of telemedicine visit vs in person visit, treatment, treatment risks and precautions. Pt prefers to treat via telemedicine empirically rather then risking or undertaking an in person visit at this moment. Patient agrees to seek prompt in person care if worsening, new symptoms arise, or if is not improving with treatment.   I discussed the assessment and treatment plan with the patient. The patient was provided an opportunity to ask questions and all were answered. The patient agreed with the plan and demonstrated an understanding of the instructions.   The patient was advised to call back or seek an in-person evaluation if the symptoms worsen or if the condition fails to improve  as anticipated.  F/u: 4 mo, in-person appt earlier if she wants to further address her joint pain and stiffness  Signed:  Crissie Sickles, MD           09/22/2019

## 2019-09-24 ENCOUNTER — Other Ambulatory Visit: Payer: Self-pay | Admitting: Family Medicine

## 2019-09-29 NOTE — Progress Notes (Signed)
[facility] Note  09/30/2019     CHIEF COMPLAINT Patient presents for Retina Follow Up   HISTORY OF PRESENT ILLNESS: Gloria Gomez is a 49 y.o. female who presents to the clinic today for:   HPI    Retina Follow Up    Patient presents with  Diabetic Retinopathy.  In both eyes.  This started weeks ago.  Severity is moderate.  Duration of weeks.  Since onset it is stable.  I, the attending physician,  performed the HPI with the patient and updated documentation appropriately.          Comments    Pt states her vision has been stable in both eyes.  Patient reports that she has been retaining fluid in lower extremities, including feet and legs--patient informed PCP about this and PCP wants to monitor at this time.  Patient denies eye pain or discomfort and denies any new or worsening floaters or fol OU.        Last edited by Bernarda Caffey, MD on 09/30/2019  9:50 PM. (History)    pt states the swelling in her ankles has come back "with a vengeance", she says she had a televisit with her dr last week, he did not put her on a diuretic, but encouraged her to increase her water intake and he wants to see her in office for a visit, she is on Janumet and Victoza for diabetes, metoprolol and losartan for blood pressure, she states her insurance has denied Invokana twice bc they want her to use something that is less effective, she is in the process of appealing this   Referring physician:   HISTORICAL INFORMATION:   Selected notes from the MEDICAL RECORD NUMBER Referred for DM exam    CURRENT MEDICATIONS: No current outpatient medications on file. (Ophthalmic Drugs)   No current facility-administered medications for this visit. (Ophthalmic Drugs)   Current Outpatient Medications (Other)  Medication Sig  . HYDROcodone-acetaminophen (NORCO/VICODIN) 5-325 MG tablet Take 1-2 tablets by mouth every 6 (six) hours as needed for moderate pain.  Marland Kitchen JANUMET 50-1000 MG  tablet TAKE 1 TABLET BY MOUTH TWICE DAILY WITH A MEAL  . liraglutide (VICTOZA) 18 MG/3ML SOPN Inject 0.2 mLs (1.2 mg total) into the skin daily. Inject 0.6mg  SQ daily for 1 week, then increase to 1.2mg  SQ daily.  Marland Kitchen losartan (COZAAR) 100 MG tablet Take 1 tablet by mouth once daily  . metoCLOPramide (REGLAN) 5 MG tablet Take 1 tablet (5 mg total) by mouth 3 (three) times daily with meals as needed for nausea.  . metoprolol succinate (TOPROL-XL) 100 MG 24 hr tablet TAKE 1 TABLET BY MOUTH ONCE DAILY WITH  OR  IMMEDIATELY  FOLLOWING  A  MEAL  . Multiple Vitamin (MULTIVITAMIN WITH MINERALS) TABS tablet Take 1 tablet by mouth daily.  Marland Kitchen OVER THE COUNTER MEDICATION Take 2 capsules by mouth daily. Neu Remedy   . pantoprazole (PROTONIX) 40 MG tablet Take 1 tablet by mouth once daily  . promethazine (PHENERGAN) 12.5 MG tablet 1-2 tabs po q6h prn nausea  . rosuvastatin (CRESTOR) 10 MG tablet Take 1 tablet (10 mg total) by mouth daily. (Patient taking differently: Take 10 mg by mouth every other day. )   No current facility-administered medications for this visit. (Other)      REVIEW OF SYSTEMS: ROS    Positive for: Endocrine, Eyes   Negative for: Constitutional, Gastrointestinal, Neurological, Skin, Genitourinary, Musculoskeletal, HENT, Cardiovascular, Respiratory, Psychiatric, Allergic/Imm, Heme/Lymph  Last edited by [name] on 09/30/2019  2:33 PM. (History)       ALLERGIES Allergies  Allergen Reactions  . Gluten Meal Swelling  . Lisinopril Cough  . Pioglitazone Other (See Comments)    ELEVATED glucoses + worse chronic nausea    PAST MEDICAL HISTORY Past Medical History:  Diagnosis Date  . Abdominal bloating    likely from diab gastroparesis.  Dr. Havery Moros started trial of reglan 03/2019.  Marland Kitchen Benign brain tumor ([place])    Cystic lesion in cerebral aqueduct region with mild hydrocephalus-- stable MRI 02/2016.  Surveillance MRI 05/2017 --dilated cerebral aqueduct related to aqueductal  stenosis and subsequent mild hydrocephalus (due to the 11 mm stable cystic lesion in cerebral aqueduct---?congenitial?.  . Cataract    OU  . Dysmenorrhea    vicodin occ during first 2 days of cycle.  . Gluten intolerance    pt reports she underwent full GI w/u to r/o celiac dz  . Hepatic steatosis    ultrasound 08/2017. Hx of very mild elevation of ALT  . History of adenomatous polyp of colon 04/08/2019   recall Feb 2024  . Hyperlipidemia, mixed   . Hypertensive retinopathy of both eyes   . Insomnia   . Iron deficiency 01/2019   Hb 11.3. Hemoccults neg x 3 03/19/19. EGD and colonoscopy 04/08/19 showed NO cause for IDA.  Pt does have menorrhagia, though, so she'll see her GYN.  started FeSO4 325 qd approx 04/14/19.  . Menorrhagia    resulting in [place] 2021  . Proliferative diabetic retinopathy of both eyes ([place])    steroid injections 10/2017--improved  . Sensorineural hearing loss of left ear    Sudden left hearing loss summer 2016--no improvement with steroids 01/2015 so brain MRI done by Dr. Redmond Baseman and it showed brain tumor that was determined to be benign.  Pt's hearing not bad enough for hearing aid as of 06/2016.  . Type 2 diabetes with complication (HCC)    +microalbuminuria, diab retpthy, diabetic gastroparesis (gastric emptying study mildly abnl 03/2017).  Recommended lantus 08/2018 but pt declined. Mild microalbuminuria.  Marland Kitchen Uterine fibroid   . White coat hypertension    Past Surgical History:  Procedure Laterality Date  . ANOSCOPY  05/12/2019   Procedure: normal exam, minimal hemorrhoid disease. Hyertrophied anal papila, benign appearing, posterior midline. Surgeon: Leighton Ruff MD  . CHOLECYSTECTOMY  2000  . COLONOSCOPY  04/08/2019   5 adenomas, recall 3 yrs; no cause for IDA found.  Hypertrophied anal papillae->bx showed low grade dysplasia; GI referred her to colorectal surgeon.  . ESOPHAGOGASTRODUODENOSCOPY  04/08/2019   mild chronic reactive gastritis. H pylori NEG.  No cause  for IDA found.  . GAS INSERTION Right 10/31/2018   Procedure: Insertion Of C3F8 Gas;  Surgeon: Bernarda Caffey, MD;  Location: [place];  Service: Ophthalmology;  Laterality: Right;  . GASTRIC EMPTYING SCAN  04/20/2017   Mildly abnormal, particularly the 1st hour of emptying.  Marland Kitchen MEMBRANE PEEL Right 10/31/2018   Procedure: MEMBRANE PEEL;  Surgeon: Bernarda Caffey, MD;  Location: [place];  Service: Ophthalmology;  Laterality: Right;  . PARS PLANA VITRECTOMY Right 04/12/2018   Procedure: Right PARS PLANA VITRECTOMY WITH 25 GAUGE with intravitreal antibiotics;  Surgeon: Bernarda Caffey, MD;  Location: [place];  Service: Ophthalmology;  Laterality: Right;  . PARS PLANA VITRECTOMY Right 10/31/2018   Procedure: PARS PLANA VITRECTOMY WITH 25 GAUGE;  Surgeon: Bernarda Caffey, MD;  Location: [place];  Service: Ophthalmology;  Laterality: Right;  .  PHOTOCOAGULATION WITH LASER Right 10/31/2018   Procedure: Photocoagulation With Laser;  Surgeon: Bernarda Caffey, MD;  Location: [place];  Service: Ophthalmology;  Laterality: Right;    FAMILY HISTORY Family History  Problem Relation Age of Onset  . Brain cancer Mother   . Diabetes Father   . Diabetes Maternal Grandmother   . Cataracts Maternal Grandmother   . Cervical cancer Paternal Grandmother   . Colon cancer Maternal Grandfather 60  . Amblyopia Neg Hx   . Blindness Neg Hx   . Glaucoma Neg Hx   . Macular degeneration Neg Hx   . Retinal detachment Neg Hx   . Strabismus Neg Hx   . Retinitis pigmentosa Neg Hx   . Esophageal cancer Neg Hx   . Stomach cancer Neg Hx   . Rectal cancer Neg Hx     SOCIAL HISTORY Social History   Tobacco Use  . Smoking status: Never Smoker  . Smokeless tobacco: Never Used  Vaping Use  . Vaping Use: Never used  Substance Use Topics  . Alcohol use: No  . Drug use: No         OPHTHALMIC EXAM:  Base Eye Exam    Visual Acuity (Snellen - Linear)      Right Left   Dist [place] 20/25 -2    Dist cc  20/30 -1   Dist ph [place] NI    Dist ph cc   20/25 -2   Correction: Glasses       Tonometry (Tonopen, 2:53 PM)      Right Left   Pressure 16 16       Pupils      Dark Light Shape React APD   Right 3 2 Round Brisk 0   Left 3 2 Round Brisk 0       Visual Fields      Left Right    Full Full       Extraocular Movement      Right Left    Full Full       Neuro/Psych    Oriented x3: Yes   Mood/Affect: Normal       Dilation    Both eyes: 1.0% Mydriacyl, 2.5% Phenylephrine @ 2:53 PM        Slit Lamp and Fundus Exam    Slit Lamp Exam      Right Left   Lids/Lashes Dermatochalasis - upper lid, mild Meibomian gland dysfunction, Telangiectasia Dermatochalasis - upper lid, Meibomian gland dysfunction, Telangiectasia   Conjunctiva/Sclera White and quiet, sutures intact White and quiet   Cornea Clear, trace Punctate epithelial erosions, well healed temporal cataract wounds Trace Punctate epithelial erosions   Anterior Chamber Deep and quiet Deep and quiet   Iris Round and dilated, No NVI Round and dilated, No NVI   Lens PC IOL in good position, 1-2+ Posterior capsular opacification, PC folds 2+ Nuclear sclerosis, 2+ Cortical cataract   Vitreous post vitrectomy, trace pigment Vitreous syneresis, vitreous condensations, residual blood clots settling inferiorly and clearing       Fundus Exam      Right Left   Disc mild Pallor, Sharp rim Pink and sharp, Compact   C/D Ratio 0.2 0.0   Macula Flat, blunted foveal reflex, central cystic changes -- slightly improved, scattered MA/exudate, cluster of MA and exudates greatest ST to fovea -- persistent Flat, blunted foveal reflex, focal MA temporal macula w/ +cystic changes - improved, scattered punctate exudates - improving, trace ERM, light focal laser changes  Vessels mild Vascular attenuation, Tortuous Vascular attenuation, mild Tortuousity   Periphery Attached, tractional fibrosis in SN quadrant removed, scattered DBH greatest superiorly, 360 PRP, good laser fill in 360 attached,  360 PRP scars good posterior fill in        Refraction    Wearing Rx      Sphere Cylinder   Right -3.75 Sphere   Left -3.75 Sphere          IMAGING AND PROCEDURES  Imaging and Procedures for @TODAY @  OCT, Retina - OU - Both Eyes       Right Eye Quality was good. Central Foveal Thickness: 370. Progression has improved. Findings include abnormal foveal contour, epiretinal membrane, no SRF, intraretinal hyper-reflective material, vitreous traction, preretinal fibrosis (Mild interval improvement in IRF).   Left Eye Quality was good. Central Foveal Thickness: 291. Progression has been stable. Findings include normal foveal contour, intraretinal fluid, no SRF (persistent IRF temporal macula -- minimal; Mild ERM ).   Notes *Images captured and stored on drive  Diagnosis / Impression:  DME OU OD: persistent IRF -- ?IVA resistance OS: persistent IRF temporal macula -- minimal; Mild ERM    Clinical management:  See below  Abbreviations: NFP - Normal foveal profile. CME - cystoid macular edema. PED - pigment epithelial detachment. IRF - intraretinal fluid. SRF - subretinal fluid. EZ - ellipsoid zone. ERM - epiretinal membrane. ORA - outer retinal atrophy. ORT - outer retinal tubulation. SRHM - subretinal hyper-reflective material         Intravitreal Injection, Pharmacologic Agent - OD - Right Eye       Time Out 09/30/2019. 3:51 PM. Confirmed correct patient, procedure, site, and patient consented.   Anesthesia Topical anesthesia was used. Anesthetic medications included Lidocaine 2%, Proparacaine 0.5%.   Procedure Preparation included 5% betadine to ocular surface, eyelid speculum. A (32g) needle was used.   Injection:  2 mg aflibercept Alfonse Flavors) SOLN   NDC: 68115-726-20, Lot: [id number], Expiration date: 12/28/2019   Route: Intravitreal, Site: Right Eye, Waste: 0.05 mL  Post-op Post injection exam found visual acuity of at least counting fingers. The patient  tolerated the procedure well. There were no complications. The patient received written and verbal post procedure care education. Post injection medications were not given.   Notes **SAMPLE MEDICATION ADMINISTERED**               ASSESSMENT/PLAN:    ICD-10-CM   1. Proliferative diabetic retinopathy of both eyes with macular edema associated with type 2 diabetes mellitus (HCC)  G53.6468 Intravitreal Injection, Pharmacologic Agent - OD - Right Eye    aflibercept (EYLEA) SOLN 2 mg  2. Retinal edema  H35.81 OCT, Retina - OU - Both Eyes  3. Vitreous hemorrhage of left eye (HCC)  H43.12   4. Right endophthalmia  H44.001   5. Vitreous hemorrhage, right eye ([place])  H43.11   6. Essential hypertension  I10   7. Hypertensive retinopathy of both eyes  H35.033   8. Combined forms of age-related cataract of left eye  H25.812   9. Pseudophakia of right eye  Z96.1     1,2.  Proliferative diabetic retinopathy w/ DME, OU  - HbA1c 8.3% (03.31.21, 12.2.20), 8.6% (06.30.20), 8.4% (02.14.20)  - s/p IVA OD #1 9.20.19, #2 (10.25.19), #3 (11.15.19), #4 (12.17.19), #5 (01.14.20), #6 (2.11.20), #7 (05.29.20), #8 (08.19.20), #9 (10.30.20), #10 (12.09.20), #11 (01.11.21), #12 (02.15.21), #13 (03.23.21), #14 (04.20.21), #15 (06.08.21), #16 (07.06.21)  - s/p IVA OS #1 9.27.19, #2 (10.25.19), #3 (  11.15.19), #4 (12.17.19), #5 (01.14.20), #6 (2.11.20), #7 (04.26.20), #8 (05.29.20), #9 (06.26.20), #10 (08.05.20), #11 (11.11.20), #12 (12.09.20), #13 (01.11.21), #14 (02.15.21), #15 (03.23.21), #16 (04.20.21), #17 (06.08.21)  - S/P PRP OS (09.20.19), (5.19.20), (08.19.20), (04.28.21)  - S/P PRP OD (9.27.19 and 11.21.19), fill-in (04.14.20) (09.03.20, surgery)  - S/P focal laser OS (07.06.21)  - FA (9.20.19) shows +NVE OU and leaking MA and capillary nonperfusion  - repeat FA 11.15.19 shows NV regressing OU  - pre-op: OD w/ VA stable at 20/25, but there is some preretinal fibrosis / tractional membranes just superior to  disc and mild central DME  - s/p 25g PPV+MP+10% C3F8 gas OD (09.03.20) -- ERM/PRF removal OD  - BCVA 20/25-2 OD (stable)             - fibrosis/ERM stably improved; retina attached  - OCT shows OD: persistent IRF -- ?IVA resistance; OS: persistent IRF temporal macula -- minimal; Mild ERM  - discussed possible IVA resistance and switch in medicaiton  - recommend IVE OD #1 today, 08.3.21 -- sample  - pt wishes to proceed with procedures  - RBA of procedure discussed, questions answered  - informed consents obtained   - see procedure notes  - [place] informed consent form signed and scanned on 01.11.2021 (OU)  - Eylea informed consent form signed and scanned on 08.03.2021  - Eylea4U benefits investigation started, 08.03.21  - f/u 4 weeks -- DFE, OCT  3. Vitreous hemorrhage OS -- improved  - recurrent VH, onset 11.11.20  - initial onset 04.24.20  - etiology: secondary to PDR as described above (no RT/RD on exam)  - s/p PRP OS (9.20.19), (05.19.20), (08.19.20), (04.28.21)  - s/p IVA OS on 4.26.20, 5.29.20, 6.26.20, 8.5.20,11.11.20, 12.06.20 and so on as above  - today, further improvement in diffuse VH post-IVA OS -- pt reports improvement in floaters  - BCVA 20/25  4. History of Endophthalmitis OD  - s/p IVA OU 04/09/2018  - s/p 25g PPV w/ intravitreal vanc, ceftaz and cefepime OD, 2.14.2020  - s/p intravitreal tap / vanc and ceflaz injections (02.16.20)             - gram stain (2.14.20) shows G+ cocci, WBCs mostly PMNs;   - repeat gram stain from t/i (2.16.20) -- no organisms, just WBCs             - cultures from vitreous grew rare Staph warneri; cultures from t/i -- no growth             - doing well, BCVA 20/30             - inflammation/posterior debris resolved             - IOP 14 off Brimonidine  - completed po pred taper -- caused significant elevations in BG  - monitor  5. History of Vitreous Hemorrhage OD -- cleared from PPV x2 for endophthalmitis and ERM/preretinal  fibrosis  - secondary to PDR as described below  - S/P IVA OD #1 (09.20.19), #2 (10.25.19), #3 (11.15.19), #4 (12.16.19), #5 (03/12/2018)  - S/P PRP OD #1 (09.27.19), fill in OD (11.21.19) -- each somewhat limited inferiorly by residual VH  6,7. Hypertensive retinopathy OU  - discussed importance of tight BP control.  - monitor  8. Combined form age related cataract OS-   - The symptoms of cataract, surgical options, and treatments and risks were discussed with patient.  - discussed diagnosis and progression  9. Pseudophakia OD  - s/p  CE/IOL OD (Dr. Zenia Resides, 12.11.20)  - beautiful surgery, doing well   Ophthalmic Meds Ordered this visit:  Meds ordered this encounter  Medications  . aflibercept (EYLEA) SOLN 2 mg       Return in about 4 weeks (around 10/28/2019) for f/u PDR OU, DFE, OCT.  There are no Patient Instructions on file for this visit.   Explained the diagnoses, plan, and follow up with the patient and they expressed understanding.  Patient expressed understanding of the importance of proper follow up care.   This document serves as a record of services personally performed by Gardiner Sleeper, MD, PhD. It was created on their behalf by Estill Bakes, COT an ophthalmic technician. The creation of this record is the provider's dictation and/or activities during the visit.    Electronically signed by: Estill Bakes, COT 8.2.21 @ 10:01 PM  Gardiner Sleeper, M.D., Ph.D. Diseases & Surgery of the Retina and [place] 09/30/2019   I have reviewed the above documentation for accuracy and completeness, and I agree with the above. Gardiner Sleeper, M.D., Ph.D. 09/30/19 10:01 PM   Abbreviations: M myopia (nearsighted); A astigmatism; H hyperopia (farsighted); P presbyopia; Mrx spectacle prescription;  CTL contact lenses; OD right eye; OS left eye; OU both eyes  XT exotropia; ET esotropia; PEK punctate epithelial keratitis; PEE punctate  epithelial erosions; DES dry eye syndrome; MGD meibomian gland dysfunction; ATs artificial tears; PFAT's preservative free artificial tears; [place] nuclear sclerotic cataract; PSC posterior subcapsular cataract; ERM epi-retinal membrane; PVD posterior vitreous detachment; RD retinal detachment; DM diabetes mellitus; DR diabetic retinopathy; NPDR non-proliferative diabetic retinopathy; PDR proliferative diabetic retinopathy; CSME clinically significant macular edema; DME diabetic macular edema; dbh dot blot hemorrhages; CWS cotton wool spot; POAG primary open angle glaucoma; C/D cup-to-disc ratio; HVF humphrey visual field; GVF goldmann visual field; OCT optical coherence tomography; IOP intraocular pressure; BRVO Branch retinal vein occlusion; CRVO central retinal vein occlusion; CRAO central retinal artery occlusion; BRAO branch retinal artery occlusion; RT retinal tear; SB scleral buckle; PPV pars plana vitrectomy; VH Vitreous hemorrhage; PRP panretinal laser photocoagulation; IVK intravitreal kenalog; VMT vitreomacular traction; MH Macular hole;  NVD neovascularization of the disc; NVE neovascularization elsewhere; AREDS age related eye disease study; ARMD age related macular degeneration; POAG primary open angle glaucoma; EBMD epithelial/anterior basement membrane dystrophy; ACIOL anterior chamber intraocular lens; IOL intraocular lens; PCIOL posterior chamber intraocular lens; Phaco/IOL phacoemulsification with intraocular lens placement; [place] photorefractive keratectomy; LASIK laser assisted in situ keratomileusis; HTN hypertension; DM diabetes mellitus; COPD chronic obstructive pulmonary disease

## 2019-09-30 ENCOUNTER — Ambulatory Visit (INDEPENDENT_AMBULATORY_CARE_PROVIDER_SITE_OTHER): Payer: BLUE CROSS/BLUE SHIELD | Admitting: Ophthalmology

## 2019-09-30 ENCOUNTER — Encounter (INDEPENDENT_AMBULATORY_CARE_PROVIDER_SITE_OTHER): Payer: Self-pay | Admitting: Ophthalmology

## 2019-09-30 ENCOUNTER — Other Ambulatory Visit: Payer: Self-pay

## 2019-09-30 DIAGNOSIS — H35033 Hypertensive retinopathy, bilateral: Secondary | ICD-10-CM

## 2019-09-30 DIAGNOSIS — Z961 Presence of intraocular lens: Secondary | ICD-10-CM

## 2019-09-30 DIAGNOSIS — H3581 Retinal edema: Secondary | ICD-10-CM | POA: Diagnosis not present

## 2019-09-30 DIAGNOSIS — H25812 Combined forms of age-related cataract, left eye: Secondary | ICD-10-CM

## 2019-09-30 DIAGNOSIS — H44001 Unspecified purulent endophthalmitis, right eye: Secondary | ICD-10-CM

## 2019-09-30 DIAGNOSIS — E113513 Type 2 diabetes mellitus with proliferative diabetic retinopathy with macular edema, bilateral: Secondary | ICD-10-CM | POA: Diagnosis not present

## 2019-09-30 DIAGNOSIS — I10 Essential (primary) hypertension: Secondary | ICD-10-CM

## 2019-09-30 DIAGNOSIS — H4312 Vitreous hemorrhage, left eye: Secondary | ICD-10-CM

## 2019-09-30 DIAGNOSIS — H4311 Vitreous hemorrhage, right eye: Secondary | ICD-10-CM

## 2019-09-30 MED ORDER — AFLIBERCEPT 2MG/0.05ML IZ SOLN FOR KALEIDOSCOPE
2.0000 mg | INTRAVITREAL | Status: AC | PRN
  Administered 2019-09-30: 2 mg via INTRAVITREAL

## 2019-10-02 ENCOUNTER — Other Ambulatory Visit: Payer: Self-pay | Admitting: Family Medicine

## 2019-10-27 ENCOUNTER — Other Ambulatory Visit: Payer: Self-pay | Admitting: Family Medicine

## 2019-10-31 ENCOUNTER — Encounter (INDEPENDENT_AMBULATORY_CARE_PROVIDER_SITE_OTHER): Payer: BLUE CROSS/BLUE SHIELD | Admitting: Ophthalmology

## 2019-10-31 ENCOUNTER — Encounter (INDEPENDENT_AMBULATORY_CARE_PROVIDER_SITE_OTHER): Payer: Self-pay

## 2019-11-06 NOTE — Progress Notes (Signed)
[facility] Note  11/07/2019     CHIEF COMPLAINT Patient presents for Retina Follow Up   HISTORY OF PRESENT ILLNESS: Gloria Gomez is a 49 y.o. female who presents to the clinic today for:   HPI    Retina Follow Up    Patient presents with  Diabetic Retinopathy.  In both eyes.  This started months ago.  Severity is moderate.  Duration of 5 weeks.  Since onset it is stable.  I, the attending physician,  performed the HPI with the patient and updated documentation appropriately.          Comments    49 y/o female pt here for 5 wk f/u for PDR w/DME OU.  No change in [place] OU.  Denies pain, FOL.  Has occasional floaters OS.  BS 158 this a.m.  A1C unknown.  No gtts.       Last edited by Bernarda Caffey, MD on 11/07/2019  3:14 PM. (History)    pt states vision is stable, she states she is still dealing with extra swelling and fluid, she is taking Janumet bc her ins still will not approve Invokana, she states her blood sugar is running between 158-228   Referring physician:   HISTORICAL INFORMATION:   Selected notes from the MEDICAL RECORD NUMBER Referred for DM exam    CURRENT MEDICATIONS: No current outpatient medications on file. (Ophthalmic Drugs)   No current facility-administered medications for this visit. (Ophthalmic Drugs)   Current Outpatient Medications (Other)  Medication Sig   HYDROcodone-acetaminophen (NORCO/VICODIN) 5-325 MG tablet Take 1-2 tablets by mouth every 6 (six) hours as needed for moderate pain.   JANUMET 50-1000 MG tablet TAKE 1 TABLET BY MOUTH TWICE DAILY WITH A MEAL   liraglutide (VICTOZA) 18 MG/3ML SOPN Inject 0.2 mLs (1.2 mg total) into the skin daily. Inject 0.6mg  SQ daily for 1 week, then increase to 1.2mg  SQ daily.   losartan (COZAAR) 100 MG tablet Take 1 tablet by mouth once daily   metoCLOPramide (REGLAN) 5 MG tablet Take 1 tablet (5 mg total) by mouth 3 (three) times daily with meals as needed for nausea.    metoprolol succinate (TOPROL-XL) 100 MG 24 hr tablet TAKE 1 TABLET BY MOUTH ONCE DAILY WITH OR IMMEDIATELY FOLLOWING A MEAL   Multiple Vitamin (MULTIVITAMIN WITH MINERALS) TABS tablet Take 1 tablet by mouth daily.   OVER THE COUNTER MEDICATION Take 2 capsules by mouth daily. Neu Remedy    pantoprazole (PROTONIX) 40 MG tablet Take 1 tablet by mouth once daily   promethazine (PHENERGAN) 12.5 MG tablet 1-2 tabs po q6h prn nausea   rosuvastatin (CRESTOR) 10 MG tablet Take 1 tablet (10 mg total) by mouth daily. (Patient taking differently: Take 10 mg by mouth every other day. )   No current facility-administered medications for this visit. (Other)      REVIEW OF SYSTEMS: ROS    Positive for: Endocrine, Eyes   Negative for: Constitutional, Gastrointestinal, Neurological, Skin, Genitourinary, Musculoskeletal, HENT, Cardiovascular, Respiratory, Psychiatric, Allergic/Imm, Heme/Lymph   Last edited by Matthew Folks, COA on 11/07/2019  2:34 PM. (History)       ALLERGIES Allergies  Allergen Reactions   Gluten Meal Swelling   Lisinopril Cough   Pioglitazone Other (See Comments)    ELEVATED glucoses + worse chronic nausea    PAST MEDICAL HISTORY Past Medical History:  Diagnosis Date   Abdominal bloating    likely from diab gastroparesis.  Dr. Havery Moros started trial  of reglan 03/2019.   Benign brain tumor ([place])    Cystic lesion in cerebral aqueduct region with mild hydrocephalus-- stable MRI 02/2016.  Surveillance MRI 05/2017 --dilated cerebral aqueduct related to aqueductal stenosis and subsequent mild hydrocephalus (due to the 11 mm stable cystic lesion in cerebral aqueduct---?congenitial?.   Cataract    OU   Dysmenorrhea    vicodin occ during first 2 days of cycle.   Gluten intolerance    pt reports she underwent full GI w/u to r/o celiac dz   Hepatic steatosis    ultrasound 08/2017. Hx of very mild elevation of ALT   History of adenomatous polyp of colon 04/08/2019    recall Feb 2024   Hyperlipidemia, mixed    Hypertensive retinopathy of both eyes    Insomnia    Iron deficiency 01/2019   Hb 11.3. Hemoccults neg x 3 03/19/19. EGD and colonoscopy 04/08/19 showed NO cause for IDA.  Pt does have menorrhagia, though, so she'll see her GYN.  started FeSO4 325 qd approx 04/14/19.   Menorrhagia    resulting in IDA 2021   Proliferative diabetic retinopathy of both eyes ([place])    steroid injections 10/2017--improved   Sensorineural hearing loss of left ear    Sudden left hearing loss summer 2016--no improvement with steroids 01/2015 so brain MRI done by Dr. Redmond Baseman and it showed brain tumor that was determined to be benign.  Pt's hearing not bad enough for hearing aid as of 06/2016.   Type 2 diabetes with complication (HCC)    +microalbuminuria, diab retpthy, diabetic gastroparesis (gastric emptying study mildly abnl 03/2017).  Recommended lantus 08/2018 but pt declined. Mild microalbuminuria.   Uterine fibroid    White coat hypertension    Past Surgical History:  Procedure Laterality Date   ANOSCOPY  05/12/2019   Procedure: normal exam, minimal hemorrhoid disease. Hyertrophied anal papila, benign appearing, posterior midline. Surgeon: Leighton Ruff MD   CHOLECYSTECTOMY  2000   COLONOSCOPY  04/08/2019   5 adenomas, recall 3 yrs; no cause for IDA found.  Hypertrophied anal papillae->bx showed low grade dysplasia; GI referred her to colorectal surgeon.   ESOPHAGOGASTRODUODENOSCOPY  04/08/2019   mild chronic reactive gastritis. H pylori NEG.  No cause for IDA found.   GAS INSERTION Right 10/31/2018   Procedure: Insertion Of C3F8 Gas;  Surgeon: Bernarda Caffey, MD;  Location: [place];  Service: Ophthalmology;  Laterality: Right;   GASTRIC EMPTYING SCAN  04/20/2017   Mildly abnormal, particularly the 1st hour of emptying.   MEMBRANE PEEL Right 10/31/2018   Procedure: MEMBRANE PEEL;  Surgeon: Bernarda Caffey, MD;  Location: [place];  Service: Ophthalmology;  Laterality:  Right;   PARS PLANA VITRECTOMY Right 04/12/2018   Procedure: Right PARS PLANA VITRECTOMY WITH 25 GAUGE with intravitreal antibiotics;  Surgeon: Bernarda Caffey, MD;  Location: [place];  Service: Ophthalmology;  Laterality: Right;   PARS PLANA VITRECTOMY Right 10/31/2018   Procedure: PARS PLANA VITRECTOMY WITH 25 GAUGE;  Surgeon: Bernarda Caffey, MD;  Location: [place];  Service: Ophthalmology;  Laterality: Right;   PHOTOCOAGULATION WITH LASER Right 10/31/2018   Procedure: Photocoagulation With Laser;  Surgeon: Bernarda Caffey, MD;  Location: [place];  Service: Ophthalmology;  Laterality: Right;    FAMILY HISTORY Family History  Problem Relation Age of Onset   Brain cancer Mother    Diabetes Father    Diabetes Maternal Grandmother    Cataracts Maternal Grandmother    Cervical cancer Paternal Grandmother    Colon cancer Maternal Grandfather  49   Amblyopia Neg Hx    Blindness Neg Hx    Glaucoma Neg Hx    Macular degeneration Neg Hx    Retinal detachment Neg Hx    Strabismus Neg Hx    Retinitis pigmentosa Neg Hx    Esophageal cancer Neg Hx    Stomach cancer Neg Hx    Rectal cancer Neg Hx     SOCIAL HISTORY Social History   Tobacco Use   Smoking status: Never Smoker   Smokeless tobacco: Never Used  Vaping Use   Vaping Use: Never used  Substance Use Topics   Alcohol use: No   Drug use: No         OPHTHALMIC EXAM:  Base Eye Exam    Visual Acuity (Snellen - Linear)      Right Left   Dist [place] 20/25 -2    Dist cc  20/30 -   Dist ph [place] NI    Dist ph cc  20/25 -2       Tonometry (Tonopen, 2:35 PM)      Right Left   Pressure 15 16       Pupils      Dark Light Shape React APD   Right 3 2 Round Brisk None   Left 3 2 Round Brisk None       Visual Fields (Counting fingers)      Left Right    Full Full       Extraocular Movement      Right Left    Full, Ortho Full, Ortho       Neuro/Psych    Oriented x3: Yes   Mood/Affect: Normal       Dilation     Both eyes: 1.0% Mydriacyl, 2.5% Phenylephrine @ 2:35 PM        Slit Lamp and Fundus Exam    Slit Lamp Exam      Right Left   Lids/Lashes Dermatochalasis - upper lid, mild Meibomian gland dysfunction, Telangiectasia Dermatochalasis - upper lid, Meibomian gland dysfunction, Telangiectasia   Conjunctiva/Sclera White and quiet, sutures intact White and quiet   Cornea Clear, trace Punctate epithelial erosions, well healed temporal cataract wounds Trace Punctate epithelial erosions   Anterior Chamber Deep and quiet Deep and quiet   Iris Round and dilated, No NVI Round and dilated, No NVI   Lens PC IOL in good position, 1-2+ Posterior capsular opacification, PC folds 2+ Nuclear sclerosis, 2+ Cortical cataract   Vitreous post vitrectomy, trace pigment Vitreous syneresis, vitreous condensations, residual blood clots settling inferiorly and clearing       Fundus Exam      Right Left   Disc mild Pallor, Sharp rim Pink and sharp, Compact   C/D Ratio 0.2 0.0   Macula Flat, blunted foveal reflex, central cystic changes -- slightly improved, scattered MA/exudate, cluster of MA and exudates greatest ST to fovea -- persistent Flat, blunted foveal reflex, focal MA temporal macula w/ +cystic changes - improved, focal MA nasal macula, scattered punctate exudates - improving, trace ERM, light focal laser changes   Vessels mild Vascular attenuation, Tortuous Vascular attenuation, mild Tortuousity   Periphery Attached, tractional fibrosis in SN quadrant removed, scattered DBH greatest superiorly, 360 PRP, good laser fill in 360 attached, 360 PRP scars good posterior fill in          IMAGING AND PROCEDURES  Imaging and Procedures for @TODAY @  OCT, Retina - OU - Both Eyes       Right  Eye Quality was good. Central Foveal Thickness: 337. Progression has improved. Findings include abnormal foveal contour, epiretinal membrane, no SRF, intraretinal hyper-reflective material, vitreous traction, preretinal  fibrosis (Mild interval improvement in IRF/cystic changes).   Left Eye Quality was good. Central Foveal Thickness: 303. Progression has been stable. Findings include normal foveal contour, intraretinal fluid, no SRF (Persistent cystic changes; Mild ERM ).   Notes *Images captured and stored on drive  Diagnosis / Impression:  DME OU OD: Mild interval improvement in IRF/cystic changes -- ?IVA resistance OS: persistent IRF temporal macula -- minimal; Mild ERM    Clinical management:  See below  Abbreviations: NFP - Normal foveal profile. CME - cystoid macular edema. PED - pigment epithelial detachment. IRF - intraretinal fluid. SRF - subretinal fluid. EZ - ellipsoid zone. ERM - epiretinal membrane. ORA - outer retinal atrophy. ORT - outer retinal tubulation. SRHM - subretinal hyper-reflective material         Intravitreal Injection, Pharmacologic Agent - OD - Right Eye       Time Out 11/07/2019. 3:15 PM. Confirmed correct patient, procedure, site, and patient consented.   Anesthesia Topical anesthesia was used. Anesthetic medications included Lidocaine 2%, Proparacaine 0.5%.   Procedure Preparation included 5% betadine to ocular surface, eyelid speculum. A (32g) needle was used.   Injection:  2 mg aflibercept Alfonse Flavors) SOLN   NDC: A3590391, Lot: [id number], Expiration date: 01/28/2020   Route: Intravitreal, Site: Right Eye, Waste: 0.05 mL  Post-op Post injection exam found visual acuity of at least counting fingers. The patient tolerated the procedure well. There were no complications. The patient received written and verbal post procedure care education.                ASSESSMENT/PLAN:    ICD-10-CM   1. Proliferative diabetic retinopathy of both eyes with macular edema associated with type 2 diabetes mellitus (HCC)  U54.2706 Intravitreal Injection, Pharmacologic Agent - OD - Right Eye    aflibercept (EYLEA) SOLN 2 mg    CANCELED: Intravitreal Injection,  Pharmacologic Agent - OS - Left Eye  2. Retinal edema  H35.81 OCT, Retina - OU - Both Eyes  3. Vitreous hemorrhage of left eye (HCC)  H43.12   4. Right endophthalmia  H44.001   5. Vitreous hemorrhage, right eye ([place])  H43.11   6. Essential hypertension  I10   7. Hypertensive retinopathy of both eyes  H35.033   8. Combined forms of age-related cataract of left eye  H25.812   9. Pseudophakia of right eye  Z96.1     1,2.  Proliferative diabetic retinopathy w/ DME, OU  - HbA1c 8.3% (03.31.21, 12.2.20), 8.6% (06.30.20), 8.4% (02.14.20)  - s/p IVA OD #1 9.20.19, #2 (10.25.19), #3 (11.15.19), #4 (12.17.19), #5 (01.14.20), #6 (2.11.20), #7 (05.29.20), #8 (08.19.20), #9 (10.30.20), #10 (12.09.20), #11 (01.11.21), #12 (02.15.21), #13 (03.23.21), #14 (04.20.21), #15 (06.08.21), #16 (07.06.21)  - s/p IVA OS #1 9.27.19, #2 (10.25.19), #3 (11.15.19), #4 (12.17.19), #5 (01.14.20), #6 (2.11.20), #7 (04.26.20), #8 (05.29.20), #9 (06.26.20), #10 (08.05.20), #11 (11.11.20), #12 (12.09.20), #13 (01.11.21), #14 (02.15.21), #15 (03.23.21), #16 (04.20.21), #17 (06.08.21) -- IVA resistance  - s/p IVE OD #1 (08.03.21) -- sample  - S/P PRP OS (09.20.19), (5.19.20), (08.19.20), (04.28.21)  - S/P PRP OD (9.27.19 and 11.21.19), fill-in (04.14.20) (09.03.20, surgery)  - S/P focal laser OS (07.06.21)  - FA (9.20.19) shows +NVE OU and leaking MA and capillary nonperfusion  - repeat FA 11.15.19 shows NV regressing OU  - pre-op: OD w/ [facility]  stable at 20/25, but there is some preretinal fibrosis / tractional membranes just superior to disc and mild central DME  - s/p 25g PPV+MP+10% C3F8 gas OD (09.03.20) -- ERM/PRF removal OD  - BCVA 20/25-2 OD (stable)             - fibrosis/ERM stably improved; retina attached  - OCT shows OD: mild interval improvement in IRF/cystic changes--good response to IVE #1; OS: persistent IRF temporal macula -- minimal; Mild ERM  - recommend IVE OD #2 today, 09.10.21  - pt wishes to proceed with  procedures  - RBA of procedure discussed, questions answered  - informed consents obtained   - see procedure notes  - [place]ast[place] informed consent form signed and scanned on 01.11.2021 (OU)  - Eylea informed consent form signed and scanned on 08.03.2021  - [email or web address] benefits investigation started, 08.03.21 -- approved as of 09.10.21  - f/u 4 weeks -- DFE, OCT  3. Vitreous hemorrhage OS -- improved  - recurrent VH, onset 11.11.20  - initial onset 04.24.20  - etiology: secondary to PDR as described above (no RT/RD on exam)  - s/p PRP OS (9.20.19), (05.19.20), (08.19.20), (04.28.21)  - s/p IVA OS on 4.26.20, 5.29.20, 6.26.20, 8.5.20,11.11.20, 12.06.20 and so on as above  - today, further improvement in diffuse VH post-IVA OS -- pt reports improvement in floaters  - BCVA 20/25  4. History of Endophthalmitis OD  - s/p IVA OU 04/09/2018  - s/p 25g PPV w/ intravitreal vanc, ceftaz and cefepime OD, 2.14.2020  - s/p intravitreal tap / vanc and ceflaz injections (02.16.20)             - gram stain (2.14.20) shows G+ cocci, WBCs mostly PMNs;   - repeat gram stain from t/i (2.16.20) -- no organisms, just WBCs             - cultures from vitreous grew rare Staph warneri; cultures from t/i -- no growth             - doing well, BCVA 20/30             - inflammation/posterior debris resolved             - IOP 15 off Brimonidine  - completed po pred taper -- caused significant elevations in BG  - monitor  5. History of Vitreous Hemorrhage OD -- cleared from PPV x2 for endophthalmitis and ERM/preretinal fibrosis  - secondary to PDR as described below  - S/P IVA OD #1 (09.20.19), #2 (10.25.19), #3 (11.15.19), #4 (12.16.19), #5 (03/12/2018)  - S/P PRP OD #1 (09.27.19), fill in OD (11.21.19) -- each somewhat limited inferiorly by residual VH  6,7. Hypertensive retinopathy OU  - discussed importance of tight BP control.  - monitor  8. Combined form age related cataract OS-   - The symptoms of cataract,  surgical options, and treatments and risks were discussed with patient.  - discussed diagnosis and progression  9. Pseudophakia OD  - s/p CE/IOL OD (Dr. Zenia Resides, 12.11.20)  - beautiful surgery, doing well   Ophthalmic Meds Ordered this visit:  Meds ordered this encounter  Medications   aflibercept (EYLEA) SOLN 2 mg       Return in about 4 weeks (around 12/05/2019) for f/u PDR OU, DFE, OCT.  There are no Patient Instructions on file for this visit.   Explained the diagnoses, plan, and follow up with the patient and they expressed understanding.  Patient expressed understanding of the importance of  proper follow up care.   This document serves as a record of services personally performed by Gardiner Sleeper, MD, PhD. It was created on their behalf by San Jetty. Owens Shark, OA an ophthalmic technician. The creation of this record is the provider's dictation and/or activities during the visit.    Electronically signed by: San Jetty. Owens Shark, [place] 09.09.2021 10:12 PM  Gardiner Sleeper, M.D., Ph.D. Diseases & Surgery of the Retina and Vitreous [facility]  I have reviewed the above documentation for accuracy and completeness, and I agree with the above. Gardiner Sleeper, M.D., Ph.D. 11/07/19 10:12 PM   Abbreviations: M myopia (nearsighted); A astigmatism; H hyperopia (farsighted); P presbyopia; Mrx spectacle prescription;  CTL contact lenses; OD right eye; OS left eye; OU both eyes  XT exotropia; ET esotropia; PEK punctate epithelial keratitis; PEE punctate epithelial erosions; DES dry eye syndrome; MGD meibomian gland dysfunction; ATs artificial tears; PFAT's preservative free artificial tears; [place] nuclear sclerotic cataract; PSC posterior subcapsular cataract; ERM epi-retinal membrane; PVD posterior vitreous detachment; RD retinal detachment; DM diabetes mellitus; DR diabetic retinopathy; NPDR non-proliferative diabetic retinopathy; PDR proliferative diabetic retinopathy;  CSME clinically significant macular edema; DME diabetic macular edema; dbh dot blot hemorrhages; CWS cotton wool spot; POAG primary open angle glaucoma; C/D cup-to-disc ratio; HVF humphrey visual field; GVF goldmann visual field; OCT optical coherence tomography; IOP intraocular pressure; BRVO Branch retinal vein occlusion; CRVO central retinal vein occlusion; CRAO central retinal artery occlusion; BRAO branch retinal artery occlusion; RT retinal tear; SB scleral buckle; PPV pars plana vitrectomy; VH Vitreous hemorrhage; PRP panretinal laser photocoagulation; IVK intravitreal kenalog; VMT vitreomacular traction; MH Macular hole;  NVD neovascularization of the disc; NVE neovascularization elsewhere; AREDS age related eye disease study; ARMD age related macular degeneration; POAG primary open angle glaucoma; EBMD epithelial/anterior basement membrane dystrophy; ACIOL anterior chamber intraocular lens; IOL intraocular lens; PCIOL posterior chamber intraocular lens; Phaco/IOL phacoemulsification with intraocular lens placement; [place] photorefractive keratectomy; LASIK laser assisted in situ keratomileusis; HTN hypertension; DM diabetes mellitus; COPD chronic obstructive pulmonary disease

## 2019-11-07 ENCOUNTER — Other Ambulatory Visit: Payer: Self-pay

## 2019-11-07 ENCOUNTER — Encounter (INDEPENDENT_AMBULATORY_CARE_PROVIDER_SITE_OTHER): Payer: Self-pay | Admitting: Ophthalmology

## 2019-11-07 ENCOUNTER — Ambulatory Visit (INDEPENDENT_AMBULATORY_CARE_PROVIDER_SITE_OTHER): Payer: BLUE CROSS/BLUE SHIELD | Admitting: Ophthalmology

## 2019-11-07 DIAGNOSIS — E113513 Type 2 diabetes mellitus with proliferative diabetic retinopathy with macular edema, bilateral: Secondary | ICD-10-CM | POA: Diagnosis not present

## 2019-11-07 DIAGNOSIS — H4311 Vitreous hemorrhage, right eye: Secondary | ICD-10-CM

## 2019-11-07 DIAGNOSIS — H44001 Unspecified purulent endophthalmitis, right eye: Secondary | ICD-10-CM

## 2019-11-07 DIAGNOSIS — H3581 Retinal edema: Secondary | ICD-10-CM | POA: Diagnosis not present

## 2019-11-07 DIAGNOSIS — I10 Essential (primary) hypertension: Secondary | ICD-10-CM

## 2019-11-07 DIAGNOSIS — H25812 Combined forms of age-related cataract, left eye: Secondary | ICD-10-CM

## 2019-11-07 DIAGNOSIS — H4312 Vitreous hemorrhage, left eye: Secondary | ICD-10-CM | POA: Diagnosis not present

## 2019-11-07 DIAGNOSIS — H35033 Hypertensive retinopathy, bilateral: Secondary | ICD-10-CM

## 2019-11-07 DIAGNOSIS — Z961 Presence of intraocular lens: Secondary | ICD-10-CM

## 2019-11-07 MED ORDER — AFLIBERCEPT 2MG/0.05ML IZ SOLN FOR KALEIDOSCOPE
2.0000 mg | INTRAVITREAL | Status: AC | PRN
  Administered 2019-11-07: 2 mg via INTRAVITREAL

## 2019-11-24 ENCOUNTER — Ambulatory Visit (INDEPENDENT_AMBULATORY_CARE_PROVIDER_SITE_OTHER): Payer: BLUE CROSS/BLUE SHIELD | Admitting: Ophthalmology

## 2019-11-24 ENCOUNTER — Encounter (INDEPENDENT_AMBULATORY_CARE_PROVIDER_SITE_OTHER): Payer: Self-pay | Admitting: Ophthalmology

## 2019-11-24 ENCOUNTER — Other Ambulatory Visit: Payer: Self-pay

## 2019-11-24 VITALS — BP 164/91 | HR 93

## 2019-11-24 DIAGNOSIS — E113513 Type 2 diabetes mellitus with proliferative diabetic retinopathy with macular edema, bilateral: Secondary | ICD-10-CM

## 2019-11-24 DIAGNOSIS — H35033 Hypertensive retinopathy, bilateral: Secondary | ICD-10-CM

## 2019-11-24 DIAGNOSIS — H4312 Vitreous hemorrhage, left eye: Secondary | ICD-10-CM | POA: Diagnosis not present

## 2019-11-24 DIAGNOSIS — I10 Essential (primary) hypertension: Secondary | ICD-10-CM

## 2019-11-24 DIAGNOSIS — H44001 Unspecified purulent endophthalmitis, right eye: Secondary | ICD-10-CM | POA: Diagnosis not present

## 2019-11-24 DIAGNOSIS — H4311 Vitreous hemorrhage, right eye: Secondary | ICD-10-CM

## 2019-11-24 DIAGNOSIS — H3581 Retinal edema: Secondary | ICD-10-CM

## 2019-11-24 MED ORDER — BEVACIZUMAB CHEMO INJECTION 1.25MG/0.05ML SYRINGE FOR KALEIDOSCOPE
1.2500 mg | INTRAVITREAL | Status: AC | PRN
  Administered 2019-11-24: 1.25 mg via INTRAVITREAL

## 2019-11-24 NOTE — Progress Notes (Addendum)
[facility] Note  11/24/2019     CHIEF COMPLAINT Patient presents for Retina Follow Up   HISTORY OF PRESENT ILLNESS: Gloria Gomez is a 49 y.o. female who presents to the clinic today for:   HPI    Retina Follow Up    Patient presents with  Diabetic Retinopathy.  In both eyes.  This started 2 days ago.  I, the attending physician,  performed the HPI with the patient and updated documentation appropriately.          Comments    Patient here for retina follow up for decrease in vision. Patient states started having a change in vision OS 2 days ago. For a couple days could only see shadows. Today can see colors. No eye pain. No flashing lights. Used OTC dry eye gtts.        Last edited by Bernarda Caffey, MD on 11/24/2019  4:25 PM. (History)    pt states last Wednesday (09.22.21) she noticed bleeding in her eyes when she woke up in the middle of the night, she states the next day in the morning, she thought it was getting better, but it gradually got worse throughout the day, pt states she was in [place] at the time and called to get an appt for today, she states over the weekend she has been sleeping with her head elevated and trying not to move her head much, she states that has helped her vision improve since last Thursday   Referring physician:   HISTORICAL INFORMATION:   Selected notes from the MEDICAL RECORD NUMBER Referred for DM exam    CURRENT MEDICATIONS: No current outpatient medications on file. (Ophthalmic Drugs)   No current facility-administered medications for this visit. (Ophthalmic Drugs)   Current Outpatient Medications (Other)  Medication Sig  . HYDROcodone-acetaminophen (NORCO/VICODIN) 5-325 MG tablet Take 1-2 tablets by mouth every 6 (six) hours as needed for moderate pain.  Marland Kitchen JANUMET 50-1000 MG tablet TAKE 1 TABLET BY MOUTH TWICE DAILY WITH A MEAL  . liraglutide (VICTOZA) 18 MG/3ML SOPN Inject 0.2 mLs (1.2 mg total) into  the skin daily. Inject 0.6mg  SQ daily for 1 week, then increase to 1.2mg  SQ daily.  Marland Kitchen losartan (COZAAR) 100 MG tablet Take 1 tablet by mouth once daily  . metoCLOPramide (REGLAN) 5 MG tablet Take 1 tablet (5 mg total) by mouth 3 (three) times daily with meals as needed for nausea.  . metoprolol succinate (TOPROL-XL) 100 MG 24 hr tablet TAKE 1 TABLET BY MOUTH ONCE DAILY WITH OR IMMEDIATELY FOLLOWING A MEAL  . Multiple Vitamin (MULTIVITAMIN WITH MINERALS) TABS tablet Take 1 tablet by mouth daily.  Marland Kitchen OVER THE COUNTER MEDICATION Take 2 capsules by mouth daily. Neu Remedy   . pantoprazole (PROTONIX) 40 MG tablet Take 1 tablet by mouth once daily  . promethazine (PHENERGAN) 12.5 MG tablet 1-2 tabs po q6h prn nausea  . rosuvastatin (CRESTOR) 10 MG tablet Take 1 tablet (10 mg total) by mouth daily. (Patient taking differently: Take 10 mg by mouth every other day. )   No current facility-administered medications for this visit. (Other)      REVIEW OF SYSTEMS: ROS    Positive for: Endocrine, Eyes   Negative for: Constitutional, Gastrointestinal, Neurological, Skin, Genitourinary, Musculoskeletal, HENT, Cardiovascular, Respiratory, Psychiatric, Allergic/Imm, Heme/Lymph   Last edited by Theodore Demark, COA on 11/24/2019  2:15 PM. (History)       ALLERGIES Allergies  Allergen Reactions  . Gluten Meal  Swelling  . Lisinopril Cough  . Pioglitazone Other (See Comments)    ELEVATED glucoses + worse chronic nausea    PAST MEDICAL HISTORY Past Medical History:  Diagnosis Date  . Abdominal bloating    likely from diab gastroparesis.  Dr. Havery Moros started trial of reglan 03/2019.  Marland Kitchen Benign brain tumor ([place])    Cystic lesion in cerebral aqueduct region with mild hydrocephalus-- stable MRI 02/2016.  Surveillance MRI 05/2017 --dilated cerebral aqueduct related to aqueductal stenosis and subsequent mild hydrocephalus (due to the 11 mm stable cystic lesion in cerebral aqueduct---?congenitial?.  .  Cataract    OU  . Dysmenorrhea    vicodin occ during first 2 days of cycle.  . Gluten intolerance    pt reports she underwent full GI w/u to r/o celiac dz  . Hepatic steatosis    ultrasound 08/2017. Hx of very mild elevation of ALT  . History of adenomatous polyp of colon 04/08/2019   recall Feb 2024  . Hyperlipidemia, mixed   . Hypertensive retinopathy of both eyes   . Insomnia   . Iron deficiency 01/2019   Hb 11.3. Hemoccults neg x 3 03/19/19. EGD and colonoscopy 04/08/19 showed NO cause for IDA.  Pt does have menorrhagia, though, so she'll see her GYN.  started FeSO4 325 qd approx 04/14/19.  . Menorrhagia    resulting in [place] 2021  . Proliferative diabetic retinopathy of both eyes ([place])    steroid injections 10/2017--improved  . Sensorineural hearing loss of left ear    Sudden left hearing loss summer 2016--no improvement with steroids 01/2015 so brain MRI done by Dr. Redmond Baseman and it showed brain tumor that was determined to be benign.  Pt's hearing not bad enough for hearing aid as of 06/2016.  . Type 2 diabetes with complication (HCC)    +microalbuminuria, diab retpthy, diabetic gastroparesis (gastric emptying study mildly abnl 03/2017).  Recommended lantus 08/2018 but pt declined. Mild microalbuminuria.  Marland Kitchen Uterine fibroid   . White coat hypertension    Past Surgical History:  Procedure Laterality Date  . ANOSCOPY  05/12/2019   Procedure: normal exam, minimal hemorrhoid disease. Hyertrophied anal papila, benign appearing, posterior midline. Surgeon: Leighton Ruff MD  . CHOLECYSTECTOMY  2000  . COLONOSCOPY  04/08/2019   5 adenomas, recall 3 yrs; no cause for IDA found.  Hypertrophied anal papillae->bx showed low grade dysplasia; GI referred her to colorectal surgeon.  . ESOPHAGOGASTRODUODENOSCOPY  04/08/2019   mild chronic reactive gastritis. H pylori NEG.  No cause for IDA found.  . GAS INSERTION Right 10/31/2018   Procedure: Insertion Of C3F8 Gas;  Surgeon: Bernarda Caffey, MD;  Location:  [place];  Service: Ophthalmology;  Laterality: Right;  . GASTRIC EMPTYING SCAN  04/20/2017   Mildly abnormal, particularly the 1st hour of emptying.  Marland Kitchen MEMBRANE PEEL Right 10/31/2018   Procedure: MEMBRANE PEEL;  Surgeon: Bernarda Caffey, MD;  Location: [place];  Service: Ophthalmology;  Laterality: Right;  . PARS PLANA VITRECTOMY Right 04/12/2018   Procedure: Right PARS PLANA VITRECTOMY WITH 25 GAUGE with intravitreal antibiotics;  Surgeon: Bernarda Caffey, MD;  Location: [place];  Service: Ophthalmology;  Laterality: Right;  . PARS PLANA VITRECTOMY Right 10/31/2018   Procedure: PARS PLANA VITRECTOMY WITH 25 GAUGE;  Surgeon: Bernarda Caffey, MD;  Location: [place];  Service: Ophthalmology;  Laterality: Right;  . PHOTOCOAGULATION WITH LASER Right 10/31/2018   Procedure: Photocoagulation With Laser;  Surgeon: Bernarda Caffey, MD;  Location: [place];  Service: Ophthalmology;  Laterality: Right;  FAMILY HISTORY Family History  Problem Relation Age of Onset  . Brain cancer Mother   . Diabetes Father   . Diabetes Maternal Grandmother   . Cataracts Maternal Grandmother   . Cervical cancer Paternal Grandmother   . Colon cancer Maternal Grandfather 58  . Amblyopia Neg Hx   . Blindness Neg Hx   . Glaucoma Neg Hx   . Macular degeneration Neg Hx   . Retinal detachment Neg Hx   . Strabismus Neg Hx   . Retinitis pigmentosa Neg Hx   . Esophageal cancer Neg Hx   . Stomach cancer Neg Hx   . Rectal cancer Neg Hx     SOCIAL HISTORY Social History   Tobacco Use  . Smoking status: Never Smoker  . Smokeless tobacco: Never Used  Vaping Use  . Vaping Use: Never used  Substance Use Topics  . Alcohol use: No  . Drug use: No         OPHTHALMIC EXAM:  Base Eye Exam    Visual Acuity (Snellen - Linear)      Right Left   Dist [place] 20/30    Dist cc  20/100   Dist ph [place] 20/25 -1    Dist ph cc  20/70 +1       Tonometry (Tonopen, 2:10 PM)      Right Left   Pressure 15 18       Pupils      Dark Light Shape  React APD   Right 3 2 Round Brisk None   Left 3 2 Round Brisk None       Visual Fields (Counting fingers)      Left Right    Full Full       Extraocular Movement      Right Left    Full, Ortho Full, Ortho       Neuro/Psych    Oriented x3: Yes   Mood/Affect: Normal       Dilation    Both eyes: 1.0% Mydriacyl, 2.5% Phenylephrine @ 2:10 PM        Slit Lamp and Fundus Exam    Slit Lamp Exam      Right Left   Lids/Lashes Dermatochalasis - upper lid, mild Meibomian gland dysfunction, Telangiectasia Dermatochalasis - upper lid, Meibomian gland dysfunction, Telangiectasia   Conjunctiva/Sclera White and quiet, sutures intact White and quiet   Cornea Clear, trace Punctate epithelial erosions, well healed temporal cataract wounds Trace Punctate epithelial erosions   Anterior Chamber Deep and quiet Deep and quiet   Iris Round and dilated, No NVI Round and dilated, No NVI   Lens PC IOL in good position, 1-2+ Posterior capsular opacification, PC folds 2+ Nuclear sclerosis, 2+ Cortical cataract   Vitreous post vitrectomy, trace pigment Vitreous syneresis, +RBC, mild diffuse VH greatest inferiorly       Fundus Exam      Right Left   Disc mild Pallor, Sharp rim Pink and sharp, Compact   C/D Ratio 0.2 0.0   Macula Flat, blunted foveal reflex, central cystic changes -- slightly improved, scattered MA/exudate, cluster of MA and exudates greatest ST to fovea -- persistent Flat, blunted foveal reflex, focal MA temporal macula w/ +cystic changes, focal MA nasal macula, scattered punctate exudates; trace ERM, light focal laser changes   Vessels mild Vascular attenuation, Tortuous Vascular attenuation, mild Tortuousity   Periphery Attached, tractional fibrosis in SN quadrant removed, scattered DBH greatest superiorly, 360 PRP, good laser fill in 360 attached, 360 PRP  scars good posterior fill in, inferior periphery obscured by new VH, scattered IRH        Refraction    Wearing Rx      Sphere  Cylinder   Right -3.75 Sphere   Left -3.75 Sphere          IMAGING AND PROCEDURES  Imaging and Procedures for @TODAY @  OCT, Retina - OU - Both Eyes       Right Eye Quality was good. Central Foveal Thickness: 316. Progression has improved. Findings include abnormal foveal contour, epiretinal membrane, no SRF, intraretinal hyper-reflective material, vitreous traction, preretinal fibrosis (Mild interval improvement in IRF/cystic changes temporal macula).   Left Eye Quality was good. Central Foveal Thickness: 303. Progression has worsened. Findings include normal foveal contour, intraretinal fluid, no SRF, intraretinal hyper-reflective material (Interval development of vitreous hemorrhage/opacities, retinal cystic changes, stable).   Notes *Images captured and stored on drive  Diagnosis / Impression:  DME OU OD: Mild interval improvement in IRF/cystic changes temporal macula  -- ?IVA resistance OS: Interval development of vitreous hemorrhage/opacities; retinal cystic changes stable  Clinical management:  See below  Abbreviations: NFP - Normal foveal profile. CME - cystoid macular edema. PED - pigment epithelial detachment. IRF - intraretinal fluid. SRF - subretinal fluid. EZ - ellipsoid zone. ERM - epiretinal membrane. ORA - outer retinal atrophy. ORT - outer retinal tubulation. SRHM - subretinal hyper-reflective material         Intravitreal Injection, Pharmacologic Agent - OS - Left Eye       Time Out 11/24/2019. 2:56 PM. Confirmed correct patient, procedure, site, and patient consented.   Anesthesia Topical anesthesia was used. Anesthetic medications included Lidocaine 2%, Proparacaine 0.5%.   Procedure Preparation included 5% betadine to ocular surface, eyelid speculum. A supplied (32g) needle was used.   Injection:  1.25 mg Bevacizumab (AVASTIN) SOLN   NDC: 62947-654-65, Lot: [id number], Expiration date: 01/04/2020   Route: Intravitreal, Site: Left Eye, Waste: 0.05  mL  Post-op Post injection exam found visual acuity of at least counting fingers. The patient tolerated the procedure well. There were no complications. The patient received written and verbal post procedure care education.                ASSESSMENT/PLAN:    ICD-10-CM   1. Proliferative diabetic retinopathy of both eyes with macular edema associated with type 2 diabetes mellitus (HCC)  C12.7517 Intravitreal Injection, Pharmacologic Agent - OS - Left Eye    Bevacizumab (AVASTIN) SOLN 1.25 mg  2. Retinal edema  H35.81 OCT, Retina - OU - Both Eyes  3. Vitreous hemorrhage of left eye (HCC)  H43.12 Intravitreal Injection, Pharmacologic Agent - OS - Left Eye    Bevacizumab (AVASTIN) SOLN 1.25 mg  4. Right endophthalmia  H44.001   5. Vitreous hemorrhage, right eye ([place])  H43.11   6. Essential hypertension  I10   7. Hypertensive retinopathy of both eyes  H35.033     1,2.  Proliferative diabetic retinopathy w/ DME, OU  - HbA1c 8.3% (03.31.21, 12.2.20), 8.6% (06.30.20), 8.4% (02.14.20)  - s/p IVA OD #1 9.20.19, #2 (10.25.19), #3 (11.15.19), #4 (12.17.19), #5 (01.14.20), #6 (2.11.20), #7 (05.29.20), #8 (08.19.20), #9 (10.30.20), #10 (12.09.20), #11 (01.11.21), #12 (02.15.21), #13 (03.23.21), #14 (04.20.21), #15 (06.08.21), #16 (07.06.21)  - s/p IVA OS #1 9.27.19, #2 (10.25.19), #3 (11.15.19), #4 (12.17.19), #5 (01.14.20), #6 (2.11.20), #7 (04.26.20), #8 (05.29.20), #9 (06.26.20), #10 (08.05.20), #11 (11.11.20), #12 (12.09.20), #13 (01.11.21), #14 (02.15.21), #15 (03.23.21), #16 (04.20.21), #17 (06.08.21) --  IVA resistance  - s/p IVE OD #1 (08.03.21) -- sample, #2 (09.10.21)  - S/P PRP OS (09.20.19), (5.19.20), (08.19.20), (04.28.21)  - S/P PRP OD (9.27.19 and 11.21.19), fill-in (04.14.20) (09.03.20, surgery)  - S/P focal laser OS (07.06.21)  - FA (9.20.19) shows +NVE OU and leaking MA and capillary nonperfusion  - repeat FA 11.15.19 shows NV regressing OU  - pre-op: OD w/ VA stable at 20/25,  but there is some preretinal fibrosis / tractional membranes just superior to disc and mild central DME  - s/p 25g PPV+MP+10% C3F8 gas OD (09.03.20) -- ERM/PRF removal OD  - BCVA 20/25-2 OD (stable)             - fibrosis/ERM stably improved; retina attached  - OCT shows OD: mild interval improvement in IRF/cystic changes--good response to IVE #1; OS: Interval development of vitreous hemorrhage/opacities, retinal cystic changes stable  - recommend IVA OS #18 today, 09.27.21, for recurrent VH OS -- see below  - Avastin informed consent form signed and scanned on 01.11.2021 (OU)  - Eylea informed consent form signed and scanned on 08.03.2021  - [email or web address] benefits investigation started, 08.03.21 -- approved as of 09.10.21  - f/u Oct 8 as scheduled for DFE/OCT, possible injection OD (4 wk interval)  3. Vitreous hemorrhage OS -- recurrent  - recurrent VH, onset 09.22.21  - etiology: secondary to PDR as described above (no RT/RD on exam)  - s/p PRP OS (9.20.19), (05.19.20), (08.19.20), (04.28.21)  - s/p IVA OS on 4.26.20, 5.29.20, 6.26.20, 8.5.20,11.11.20, 12.06.20 and so on as above  - BCVA decreased today to 20/70 from 20/25  - recommend IVA OS #18 today, 09.27.21  - pt wishes to proceed  - RBA of procedure discussed, questions answered  - informed consent obtained and signed  - see procedure note  - f/u October 8 as scheduled for DFE/OCT, possible injection OD  4. History of Endophthalmitis OD  - s/p IVA OU 04/09/2018  - s/p 25g PPV w/ intravitreal vanc, ceftaz and cefepime OD, 2.14.2020  - s/p intravitreal tap / vanc and ceflaz injections (02.16.20)             - gram stain (2.14.20) shows G+ cocci, WBCs mostly PMNs;   - repeat gram stain from t/i (2.16.20) -- no organisms, just WBCs             - cultures from vitreous grew rare Staph warneri; cultures from t/i -- no growth             - doing well, BCVA 20/30             - inflammation/posterior debris resolved             - IOP 15 off  Brimonidine  - completed po pred taper -- caused significant elevations in BG  - monitor  5. History of Vitreous Hemorrhage OD -- cleared from PPV x2 for endophthalmitis and ERM/preretinal fibrosis  - secondary to PDR as described below  - S/P IVA OD #1 (09.20.19), #2 (10.25.19), #3 (11.15.19), #4 (12.16.19), #5 (03/12/2018)  - S/P PRP OD #1 (09.27.19), fill in OD (11.21.19) -- each somewhat limited inferiorly by residual VH  6,7. Hypertensive retinopathy OU  - BP elevated today (9.27.21) -- 164/91, left arm  - discussed importance of tight BP control.  - monitor  8. Combined form age related cataract OS-   - The symptoms of cataract, surgical options, and treatments and risks were discussed with patient.  - discussed  diagnosis and progression  9. Pseudophakia OD  - s/p CE/IOL OD (Dr. Zenia Resides, 12.11.20)  - beautiful surgery, doing well   Ophthalmic Meds Ordered this visit:  Meds ordered this encounter  Medications  . Bevacizumab (AVASTIN) SOLN 1.25 mg       Return for as scheduled (10.08.21) PDR OU, DFE, OCT.  There are no Patient Instructions on file for this visit.   Explained the diagnoses, plan, and follow up with the patient and they expressed understanding.  Patient expressed understanding of the importance of proper follow up care.   This document serves as a record of services personally performed by Gardiner Sleeper, MD, PhD. It was created on their behalf by San Jetty. Owens Shark, OA an ophthalmic technician. The creation of this record is the provider's dictation and/or activities during the visit.    Electronically signed by: San Jetty. Owens Shark, [place] 09.27.2021 4:33 PM  Gardiner Sleeper, M.D., Ph.D. Diseases & Surgery of the Retina and Vitreous [facility]  I have reviewed the above documentation for accuracy and completeness, and I agree with the above. Gardiner Sleeper, M.D., Ph.D. 11/24/19 4:33 PM   Abbreviations: M myopia (nearsighted); A  astigmatism; H hyperopia (farsighted); P presbyopia; Mrx spectacle prescription;  CTL contact lenses; OD right eye; OS left eye; OU both eyes  XT exotropia; ET esotropia; PEK punctate epithelial keratitis; PEE punctate epithelial erosions; DES dry eye syndrome; MGD meibomian gland dysfunction; ATs artificial tears; PFAT's preservative free artificial tears; [place] nuclear sclerotic cataract; PSC posterior subcapsular cataract; ERM epi-retinal membrane; PVD posterior vitreous detachment; RD retinal detachment; DM diabetes mellitus; DR diabetic retinopathy; NPDR non-proliferative diabetic retinopathy; PDR proliferative diabetic retinopathy; CSME clinically significant macular edema; DME diabetic macular edema; dbh dot blot hemorrhages; CWS cotton wool spot; POAG primary open angle glaucoma; C/D cup-to-disc ratio; HVF humphrey visual field; GVF goldmann visual field; OCT optical coherence tomography; IOP intraocular pressure; BRVO Branch retinal vein occlusion; CRVO central retinal vein occlusion; CRAO central retinal artery occlusion; BRAO branch retinal artery occlusion; RT retinal tear; SB scleral buckle; PPV pars plana vitrectomy; VH Vitreous hemorrhage; PRP panretinal laser photocoagulation; IVK intravitreal kenalog; VMT vitreomacular traction; MH Macular hole;  NVD neovascularization of the disc; NVE neovascularization elsewhere; AREDS age related eye disease study; ARMD age related macular degeneration; POAG primary open angle glaucoma; EBMD epithelial/anterior basement membrane dystrophy; ACIOL anterior chamber intraocular lens; IOL intraocular lens; PCIOL posterior chamber intraocular lens; Phaco/IOL phacoemulsification with intraocular lens placement; [place] photorefractive keratectomy; LASIK laser assisted in situ keratomileusis; HTN hypertension; DM diabetes mellitus; COPD chronic obstructive pulmonary disease

## 2019-11-26 ENCOUNTER — Other Ambulatory Visit: Payer: Self-pay | Admitting: Family Medicine

## 2019-12-02 NOTE — Progress Notes (Signed)
[facility] Note  12/05/2019     CHIEF COMPLAINT Patient presents for Retina Follow Up   HISTORY OF PRESENT ILLNESS: Gloria Gomez is a 49 y.o. female who presents to the clinic today for:   HPI    Retina Follow Up    Patient presents with  Diabetic Retinopathy.  In both eyes.  This started 4 weeks ago.  I, the attending physician,  performed the HPI with the patient and updated documentation appropriately.          Comments    Patient here for 4 weeks retina follow up for PDR OU. Patient states vision doing pretty good. Better than last visit. Sees some black squiggly lines-better than was. No eye pain. Has continued swelling of feet and ankles.       Last edited by Bernarda Caffey, MD on 12/05/2019  3:00 PM. (History)    pt states left eye vision has gotten better   Referring physician:   HISTORICAL INFORMATION:   Selected notes from the MEDICAL RECORD NUMBER Referred for DM exam    CURRENT MEDICATIONS: No current outpatient medications on file. (Ophthalmic Drugs)   No current facility-administered medications for this visit. (Ophthalmic Drugs)   Current Outpatient Medications (Other)  Medication Sig  . HYDROcodone-acetaminophen (NORCO/VICODIN) 5-325 MG tablet Take 1-2 tablets by mouth every 6 (six) hours as needed for moderate pain.  Marland Kitchen JANUMET 50-1000 MG tablet TAKE 1 TABLET BY MOUTH TWICE DAILY WITH A MEAL  . liraglutide (VICTOZA) 18 MG/3ML SOPN Inject 0.2 mLs (1.2 mg total) into the skin daily. Inject 0.6mg  SQ daily for 1 week, then increase to 1.2mg  SQ daily.  Marland Kitchen losartan (COZAAR) 100 MG tablet Take 1 tablet by mouth once daily  . metoCLOPramide (REGLAN) 5 MG tablet Take 1 tablet (5 mg total) by mouth 3 (three) times daily with meals as needed for nausea.  . metoprolol succinate (TOPROL-XL) 100 MG 24 hr tablet TAKE 1 TABLET BY MOUTH ONCE DAILY WITH  OR  IMMEDIATELY  FOLLOWING  A  MEAL  . Multiple Vitamin (MULTIVITAMIN WITH MINERALS)  TABS tablet Take 1 tablet by mouth daily.  Marland Kitchen OVER THE COUNTER MEDICATION Take 2 capsules by mouth daily. Neu Remedy   . pantoprazole (PROTONIX) 40 MG tablet Take 1 tablet by mouth once daily  . promethazine (PHENERGAN) 12.5 MG tablet 1-2 tabs po q6h prn nausea  . rosuvastatin (CRESTOR) 10 MG tablet Take 1 tablet (10 mg total) by mouth daily. (Patient taking differently: Take 10 mg by mouth every other day. )   No current facility-administered medications for this visit. (Other)      REVIEW OF SYSTEMS: ROS    Positive for: Endocrine, Eyes   Negative for: Constitutional, Gastrointestinal, Neurological, Skin, Genitourinary, Musculoskeletal, HENT, Cardiovascular, Respiratory, Psychiatric, Allergic/Imm, Heme/Lymph   Last edited by Theodore Demark, COA on 12/05/2019  2:54 PM. (History)       ALLERGIES Allergies  Allergen Reactions  . Gluten Meal Swelling  . Lisinopril Cough  . Pioglitazone Other (See Comments)    ELEVATED glucoses + worse chronic nausea    PAST MEDICAL HISTORY Past Medical History:  Diagnosis Date  . Abdominal bloating    likely from diab gastroparesis.  Dr. Havery Moros started trial of reglan 03/2019.  Marland Kitchen Benign brain tumor ([place])    Cystic lesion in cerebral aqueduct region with mild hydrocephalus-- stable MRI 02/2016.  Surveillance MRI 05/2017 --dilated cerebral aqueduct related to aqueductal stenosis and subsequent mild hydrocephalus (  due to the 11 mm stable cystic lesion in cerebral aqueduct---?congenitial?.  . Cataract    OU  . Dysmenorrhea    vicodin occ during first 2 days of cycle.  . Gluten intolerance    pt reports she underwent full GI w/u to r/o celiac dz  . Hepatic steatosis    ultrasound 08/2017. Hx of very mild elevation of ALT  . History of adenomatous polyp of colon 04/08/2019   recall Feb 2024  . Hyperlipidemia, mixed   . Hypertensive retinopathy of both eyes   . Insomnia   . Iron deficiency 01/2019   Hb 11.3. Hemoccults neg x 3 03/19/19. EGD  and colonoscopy 04/08/19 showed NO cause for IDA.  Pt does have menorrhagia, though, so she'll see her GYN.  started FeSO4 325 qd approx 04/14/19.  . Menorrhagia    resulting in [place] 2021  . Proliferative diabetic retinopathy of both eyes ([place])    steroid injections 10/2017--improved  . Sensorineural hearing loss of left ear    Sudden left hearing loss summer 2016--no improvement with steroids 01/2015 so brain MRI done by Dr. Redmond Baseman and it showed brain tumor that was determined to be benign.  Pt's hearing not bad enough for hearing aid as of 06/2016.  . Type 2 diabetes with complication (HCC)    +microalbuminuria, diab retpthy, diabetic gastroparesis (gastric emptying study mildly abnl 03/2017).  Recommended lantus 08/2018 but pt declined. Mild microalbuminuria.  Marland Kitchen Uterine fibroid   . White coat hypertension    Past Surgical History:  Procedure Laterality Date  . ANOSCOPY  05/12/2019   Procedure: normal exam, minimal hemorrhoid disease. Hyertrophied anal papila, benign appearing, posterior midline. Surgeon: Leighton Ruff MD  . CHOLECYSTECTOMY  2000  . COLONOSCOPY  04/08/2019   5 adenomas, recall 3 yrs; no cause for IDA found.  Hypertrophied anal papillae->bx showed low grade dysplasia; GI referred her to colorectal surgeon.  . ESOPHAGOGASTRODUODENOSCOPY  04/08/2019   mild chronic reactive gastritis. H pylori NEG.  No cause for IDA found.  . GAS INSERTION Right 10/31/2018   Procedure: Insertion Of C3F8 Gas;  Surgeon: Bernarda Caffey, MD;  Location: [place];  Service: Ophthalmology;  Laterality: Right;  . GASTRIC EMPTYING SCAN  04/20/2017   Mildly abnormal, particularly the 1st hour of emptying.  Marland Kitchen MEMBRANE PEEL Right 10/31/2018   Procedure: MEMBRANE PEEL;  Surgeon: Bernarda Caffey, MD;  Location: [place];  Service: Ophthalmology;  Laterality: Right;  . PARS PLANA VITRECTOMY Right 04/12/2018   Procedure: Right PARS PLANA VITRECTOMY WITH 25 GAUGE with intravitreal antibiotics;  Surgeon: Bernarda Caffey, MD;   Location: [place];  Service: Ophthalmology;  Laterality: Right;  . PARS PLANA VITRECTOMY Right 10/31/2018   Procedure: PARS PLANA VITRECTOMY WITH 25 GAUGE;  Surgeon: Bernarda Caffey, MD;  Location: [place];  Service: Ophthalmology;  Laterality: Right;  . PHOTOCOAGULATION WITH LASER Right 10/31/2018   Procedure: Photocoagulation With Laser;  Surgeon: Bernarda Caffey, MD;  Location: [place];  Service: Ophthalmology;  Laterality: Right;    FAMILY HISTORY Family History  Problem Relation Age of Onset  . Brain cancer Mother   . Diabetes Father   . Diabetes Maternal Grandmother   . Cataracts Maternal Grandmother   . Cervical cancer Paternal Grandmother   . Colon cancer Maternal Grandfather 53  . Amblyopia Neg Hx   . Blindness Neg Hx   . Glaucoma Neg Hx   . Macular degeneration Neg Hx   . Retinal detachment Neg Hx   . Strabismus Neg Hx   .  Retinitis pigmentosa Neg Hx   . Esophageal cancer Neg Hx   . Stomach cancer Neg Hx   . Rectal cancer Neg Hx     SOCIAL HISTORY Social History   Tobacco Use  . Smoking status: Never Smoker  . Smokeless tobacco: Never Used  Vaping Use  . Vaping Use: Never used  Substance Use Topics  . Alcohol use: No  . Drug use: No         OPHTHALMIC EXAM:  Base Eye Exam    Visual Acuity (Snellen - Linear)      Right Left   Dist [place] 20/40 -2    Dist cc  20/40 +2   Dist ph [place] 20/30 +2    Dist ph cc  20/30       Tonometry (Tonopen, 2:50 PM)      Right Left   Pressure 16 18       Pupils      Dark Light Shape React APD   Right 3 2 Round Brisk None   Left 3 2 Round Brisk None       Visual Fields (Counting fingers)      Left Right    Full Full       Extraocular Movement      Right Left    Full, Ortho Full, Ortho       Neuro/Psych    Oriented x3: Yes   Mood/Affect: Normal       Dilation    Both eyes: 1.0% Mydriacyl, 2.5% Phenylephrine @ 2:50 PM        Slit Lamp and Fundus Exam    Slit Lamp Exam      Right Left   Lids/Lashes Dermatochalasis  - upper lid, mild Meibomian gland dysfunction, Telangiectasia Dermatochalasis - upper lid, Meibomian gland dysfunction, Telangiectasia   Conjunctiva/Sclera White and quiet, sutures intact White and quiet   Cornea Clear, trace Punctate epithelial erosions, well healed temporal cataract wounds Trace Punctate epithelial erosions   Anterior Chamber Deep and quiet Deep and quiet   Iris Round and dilated, No NVI Round and dilated, No NVI   Lens PC IOL in good position, 1-2+ Posterior capsular opacification, PC folds 2+ Nuclear sclerosis, 2+ Cortical cataract   Vitreous post vitrectomy, trace pigment Vitreous syneresis, blood stained vitreous condensations clearing centrally and settling inferiorly       Fundus Exam      Right Left   Disc mild Pallor, Sharp rim Pink and sharp, Compact   C/D Ratio 0.2 0.0   Macula Flat, blunted foveal reflex, mild cystic changes ST to fovea-- slightly increased, scattered MA/exudate, cluster of MA and exudates greatest ST to fovea -- persistent Flat, blunted foveal reflex, trace cystic changes, scattered MA; trace ERM, light focal laser changes   Vessels mild Vascular attenuation, Tortuous Vascular attenuation, mild Tortuousity   Periphery Attached, tractional fibrosis in SN quadrant removed, scattered DBH greatest superiorly, 360 PRP, good laser fill in 360 attached, 360 PRP scars good posterior fill in, inferior periphery obscured by new VH, scattered IRH        Refraction    Wearing Rx      Sphere Cylinder   Right -3.75 Sphere   Left -3.75 Sphere          IMAGING AND PROCEDURES  Imaging and Procedures for @TODAY @  OCT, Retina - OU - Both Eyes       Right Eye Quality was good. Central Foveal Thickness: 325. Progression has worsened. Findings include abnormal  foveal contour, epiretinal membrane, no SRF, intraretinal hyper-reflective material, vitreous traction, preretinal fibrosis (Mild interval increase in temoral IRF/edema).   Left Eye Quality was  good. Central Foveal Thickness: 299. Progression has improved. Findings include normal foveal contour, intraretinal fluid, no SRF, intraretinal hyper-reflective material (Interval improvement in vitreous opacities and temporal cystic changes).   Notes *Images captured and stored on drive  Diagnosis / Impression:  DME OU OD: Mild interval increase in temoral IRF/edema OS: Interval improvement in vitreous opacities and temporal cystic changes  Clinical management:  See below  Abbreviations: NFP - Normal foveal profile. CME - cystoid macular edema. PED - pigment epithelial detachment. IRF - intraretinal fluid. SRF - subretinal fluid. EZ - ellipsoid zone. ERM - epiretinal membrane. ORA - outer retinal atrophy. ORT - outer retinal tubulation. SRHM - subretinal hyper-reflective material         Intravitreal Injection, Pharmacologic Agent - OD - Right Eye       Time Out 12/05/2019. 3:15 PM. Confirmed correct patient, procedure, site, and patient consented.   Anesthesia Topical anesthesia was used. Anesthetic medications included Lidocaine 2%, Proparacaine 0.5%.   Procedure Preparation included 5% betadine to ocular surface, eyelid speculum. A (32g) needle was used.   Injection:  2 mg aflibercept Alfonse Flavors) SOLN   NDC: A3590391, Lot: [id number], Expiration date: 02/28/2020   Route: Intravitreal, Site: Right Eye, Waste: 0.05 mL  Post-op Post injection exam found visual acuity of at least counting fingers. The patient tolerated the procedure well. There were no complications. The patient received written and verbal post procedure care education. Post injection medications were not given.                ASSESSMENT/PLAN:    ICD-10-CM   1. Proliferative diabetic retinopathy of both eyes with macular edema associated with type 2 diabetes mellitus (HCC)  M57.8469 Intravitreal Injection, Pharmacologic Agent - OD - Right Eye    aflibercept (EYLEA) SOLN 2 mg  2. Retinal edema  H35.81  OCT, Retina - OU - Both Eyes  3. Vitreous hemorrhage of left eye (HCC)  H43.12   4. Right endophthalmia  H44.001   5. Vitreous hemorrhage, right eye ([place])  H43.11   6. Essential hypertension  I10   7. Hypertensive retinopathy of both eyes  H35.033   8. Combined forms of age-related cataract of left eye  H25.812   9. Pseudophakia of right eye  Z96.1     1,2.  Proliferative diabetic retinopathy w/ DME, OU  - HbA1c 8.3% (03.31.21, 12.2.20), 8.6% (06.30.20), 8.4% (02.14.20)  - s/p IVA OD #1 9.20.19, #2 (10.25.19), #3 (11.15.19), #4 (12.17.19), #5 (01.14.20), #6 (2.11.20), #7 (05.29.20), #8 (08.19.20), #9 (10.30.20), #10 (12.09.20), #11 (01.11.21), #12 (02.15.21), #13 (03.23.21), #14 (04.20.21), #15 (06.08.21), #16 (07.06.21)  - s/p IVA OS #1 9.27.19, #2 (10.25.19), #3 (11.15.19), #4 (12.17.19), #5 (01.14.20), #6 (2.11.20), #7 (04.26.20), #8 (05.29.20), #9 (06.26.20), #10 (08.05.20), #11 (11.11.20), #12 (12.09.20), #13 (01.11.21), #14 (02.15.21), #15 (03.23.21), #16 (04.20.21), #17 (06.08.21) -- IVA resistance, #18 (09.27.21)  - s/p IVE OD #1 (08.03.21) -- sample, #2 (09.10.21)  - S/P PRP OS (09.20.19), (5.19.20), (08.19.20), (04.28.21)  - S/P PRP OD (9.27.19 and 11.21.19), fill-in (04.14.20) (09.03.20, surgery)  - S/P focal laser OS (07.06.21)  - FA (9.20.19) shows +NVE OU and leaking MA and capillary nonperfusion  - repeat FA 11.15.19 shows NV regressing OU  - pre-op: OD w/ VA stable at 20/25, but there is some preretinal fibrosis / tractional membranes just superior to disc  and mild central DME  - s/p 25g PPV+MP+10% C3F8 gas OD (09.03.20) -- ERM/PRF removal OD  - BCVA 20/30++ OD (decreased)             - fibrosis/ERM stably improved; retina attached  - OCT shows OD: Mild interval increase in temoral IRF/edema; OS: Interval improvement in vitreous opacities and temporal cystic changes  - recommend IVE OD #3 (10.08.21); OS not due yet until 10.25.21  - pt wishes to proceed with injection  -  RBA of procedure discussed, questions answered  `- informed consent obtained and signed  - see procedure note  - Avastin informed consent form signed and scanned on 01.11.2021 (OU)  - Eylea informed consent form signed and scanned on 08.03.2021  - [email or web address] benefits investigation started, 08.03.21 -- approved as of 09.10.21  - f/u week of October 25, DFE, OCT, possible injection OS  3. Vitreous hemorrhage OS -- recurrent  - recurrent VH, onset 09.22.21  - etiology: secondary to PDR as described above (no RT/RD on exam)  - s/p PRP OS (9.20.19), (05.19.20), (08.19.20), (04.28.21)  - s/p IVA OS on 4.26.20, 5.29.20, 6.26.20, 8.5.20,11.11.20, 12.06.20 and so on as above  - BCVA improved today to 20/30 from 20/70  - f/u October 25 for DFE/OCT, possible injection OS  4. History of Endophthalmitis OD  - s/p IVA OU 04/09/2018  - s/p 25g PPV w/ intravitreal vanc, ceftaz and cefepime OD, 2.14.2020  - s/p intravitreal tap / vanc and ceflaz injections (02.16.20)             - gram stain (2.14.20) shows G+ cocci, WBCs mostly PMNs;   - repeat gram stain from t/i (2.16.20) -- no organisms, just WBCs             - cultures from vitreous grew rare Staph warneri; cultures from t/i -- no growth             - doing well, BCVA 20/30             - inflammation/posterior debris resolved             - IOP 16 off Brimonidine  - completed po pred taper -- caused significant elevations in BG  - monitor  5. History of Vitreous Hemorrhage OD -- cleared from PPV x2 for endophthalmitis and ERM/preretinal fibrosis  - secondary to PDR as described below  - S/P IVA OD #1 (09.20.19), #2 (10.25.19), #3 (11.15.19), #4 (12.16.19), #5 (03/12/2018)  - S/P PRP OD #1 (09.27.19), fill in OD (11.21.19) -- each somewhat limited inferiorly by residual VH  6,7. Hypertensive retinopathy OU  - BP elevated today (9.27.21) -- 164/91, left arm  - discussed importance of tight BP control.  - monitor  8. Combined form age related  cataract OS-   - The symptoms of cataract, surgical options, and treatments and risks were discussed with patient.  - discussed diagnosis and progression  9. Pseudophakia OD  - s/p CE/IOL OD (Dr. Zenia Resides, 12.11.20)  - beautiful surgery, doing well   Ophthalmic Meds Ordered this visit:  Meds ordered this encounter  Medications  . aflibercept (EYLEA) SOLN 2 mg       Return for f/u week of October 25, PDR, [place], [place].  There are no Patient Instructions on file for this visit.   Explained the diagnoses, plan, and follow up with the patient and they expressed understanding.  Patient expressed understanding of the importance of proper follow up care.   This document  serves as a record of services personally performed by Gardiner Sleeper, MD, PhD. It was created on their behalf by San Jetty. Owens Shark, OA an ophthalmic technician. The creation of this record is the provider's dictation and/or activities during the visit.    Electronically signed by: San Jetty. [place], [place] 10.05.2021 1:38 AM  Gardiner Sleeper, M.D., Ph.D. Diseases & Surgery of the Retina and Vitreous [facility]  I have reviewed the above documentation for accuracy and completeness, and I agree with the above. Gardiner Sleeper, M.D., Ph.D. 12/06/19 1:38 AM   Abbreviations: M myopia (nearsighted); A astigmatism; H hyperopia (farsighted); P presbyopia; Mrx spectacle prescription;  CTL contact lenses; OD right eye; OS left eye; OU both eyes  XT exotropia; ET esotropia; PEK punctate epithelial keratitis; PEE punctate epithelial erosions; DES dry eye syndrome; MGD meibomian gland dysfunction; ATs artificial tears; PFAT's preservative free artificial tears; [place] nuclear sclerotic cataract; PSC posterior subcapsular cataract; ERM epi-retinal membrane; PVD posterior vitreous detachment; RD retinal detachment; DM diabetes mellitus; DR diabetic retinopathy; NPDR non-proliferative diabetic retinopathy; PDR proliferative  diabetic retinopathy; CSME clinically significant macular edema; DME diabetic macular edema; dbh dot blot hemorrhages; CWS cotton wool spot; POAG primary open angle glaucoma; C/D cup-to-disc ratio; HVF humphrey visual field; GVF goldmann visual field; OCT optical coherence tomography; IOP intraocular pressure; BRVO Branch retinal vein occlusion; CRVO central retinal vein occlusion; CRAO central retinal artery occlusion; BRAO branch retinal artery occlusion; RT retinal tear; SB scleral buckle; PPV pars plana vitrectomy; VH Vitreous hemorrhage; PRP panretinal laser photocoagulation; IVK intravitreal kenalog; VMT vitreomacular traction; MH Macular hole;  NVD neovascularization of the disc; NVE neovascularization elsewhere; AREDS age related eye disease study; ARMD age related macular degeneration; POAG primary open angle glaucoma; EBMD epithelial/anterior basement membrane dystrophy; ACIOL anterior chamber intraocular lens; IOL intraocular lens; PCIOL posterior chamber intraocular lens; Phaco/IOL phacoemulsification with intraocular lens placement; [place] photorefractive keratectomy; LASIK laser assisted in situ keratomileusis; HTN hypertension; DM diabetes mellitus; COPD chronic obstructive pulmonary disease

## 2019-12-05 ENCOUNTER — Encounter (INDEPENDENT_AMBULATORY_CARE_PROVIDER_SITE_OTHER): Payer: Self-pay | Admitting: Ophthalmology

## 2019-12-05 ENCOUNTER — Other Ambulatory Visit: Payer: Self-pay

## 2019-12-05 ENCOUNTER — Ambulatory Visit (INDEPENDENT_AMBULATORY_CARE_PROVIDER_SITE_OTHER): Payer: BLUE CROSS/BLUE SHIELD | Admitting: Ophthalmology

## 2019-12-05 DIAGNOSIS — H4312 Vitreous hemorrhage, left eye: Secondary | ICD-10-CM

## 2019-12-05 DIAGNOSIS — H4311 Vitreous hemorrhage, right eye: Secondary | ICD-10-CM

## 2019-12-05 DIAGNOSIS — H35033 Hypertensive retinopathy, bilateral: Secondary | ICD-10-CM

## 2019-12-05 DIAGNOSIS — H44001 Unspecified purulent endophthalmitis, right eye: Secondary | ICD-10-CM | POA: Diagnosis not present

## 2019-12-05 DIAGNOSIS — Z961 Presence of intraocular lens: Secondary | ICD-10-CM

## 2019-12-05 DIAGNOSIS — H25812 Combined forms of age-related cataract, left eye: Secondary | ICD-10-CM

## 2019-12-05 DIAGNOSIS — I10 Essential (primary) hypertension: Secondary | ICD-10-CM

## 2019-12-05 DIAGNOSIS — E113513 Type 2 diabetes mellitus with proliferative diabetic retinopathy with macular edema, bilateral: Secondary | ICD-10-CM

## 2019-12-05 DIAGNOSIS — H3581 Retinal edema: Secondary | ICD-10-CM | POA: Diagnosis not present

## 2019-12-06 DIAGNOSIS — E113513 Type 2 diabetes mellitus with proliferative diabetic retinopathy with macular edema, bilateral: Secondary | ICD-10-CM | POA: Diagnosis not present

## 2019-12-06 MED ORDER — AFLIBERCEPT 2MG/0.05ML IZ SOLN FOR KALEIDOSCOPE
2.0000 mg | INTRAVITREAL | Status: AC | PRN
  Administered 2019-12-06: 2 mg via INTRAVITREAL

## 2019-12-17 NOTE — Progress Notes (Signed)
[facility] Note  12/22/2019     CHIEF COMPLAINT Patient presents for Retina Follow Up   HISTORY OF PRESENT ILLNESS: Gloria Gomez is a 49 y.o. female who presents to the clinic today for:   HPI    Retina Follow Up    Patient presents with  Diabetic Retinopathy.  In both eyes.  This started months ago.  Severity is moderate.  Duration of 3 weeks.  Since onset it is stable.  I, the attending physician,  performed the HPI with the patient and updated documentation appropriately.          Comments    49 y/o female pt here for 3 wk f/u for PDR w/DME OU.  VA OD about the same as at last visit.  VA OS seems slightly improved,  No pain, FOL, floaters.  No gtts.  BS 138 yesterday a.m.  A1C unknown.       Last edited by Bernarda Caffey, MD on 12/22/2019  5:10 PM. (History)    pt states her vision is "pretty good", better than when she was here last, she states the swelling in her legs and feet has improved on its own, her BP this morning was 140/84   Referring physician:   HISTORICAL INFORMATION:   Selected notes from the MEDICAL RECORD NUMBER Referred for DM exam    CURRENT MEDICATIONS: No current outpatient medications on file. (Ophthalmic Drugs)   No current facility-administered medications for this visit. (Ophthalmic Drugs)   Current Outpatient Medications (Other)  Medication Sig  . HYDROcodone-acetaminophen (NORCO/VICODIN) 5-325 MG tablet Take 1-2 tablets by mouth every 6 (six) hours as needed for moderate pain.  Marland Kitchen JANUMET 50-1000 MG tablet TAKE 1 TABLET BY MOUTH TWICE DAILY WITH A MEAL  . liraglutide (VICTOZA) 18 MG/3ML SOPN Inject 0.2 mLs (1.2 mg total) into the skin daily. Inject 0.6mg  SQ daily for 1 week, then increase to 1.2mg  SQ daily.  Marland Kitchen losartan (COZAAR) 100 MG tablet Take 1 tablet by mouth once daily  . metoCLOPramide (REGLAN) 5 MG tablet Take 1 tablet (5 mg total) by mouth 3 (three) times daily with meals as needed for nausea.  .  metoprolol succinate (TOPROL-XL) 100 MG 24 hr tablet TAKE 1 TABLET BY MOUTH ONCE DAILY WITH  OR  IMMEDIATELY  FOLLOWING  A  MEAL  . Multiple Vitamin (MULTIVITAMIN WITH MINERALS) TABS tablet Take 1 tablet by mouth daily.  Marland Kitchen OVER THE COUNTER MEDICATION Take 2 capsules by mouth daily. Neu Remedy   . pantoprazole (PROTONIX) 40 MG tablet Take 1 tablet by mouth once daily  . promethazine (PHENERGAN) 12.5 MG tablet 1-2 tabs po q6h prn nausea  . rosuvastatin (CRESTOR) 10 MG tablet Take 1 tablet (10 mg total) by mouth daily. (Patient taking differently: Take 10 mg by mouth every other day. )   No current facility-administered medications for this visit. (Other)      REVIEW OF SYSTEMS: ROS    Positive for: Endocrine, Eyes   Negative for: Constitutional, Gastrointestinal, Neurological, Skin, Genitourinary, Musculoskeletal, HENT, Cardiovascular, Respiratory, Psychiatric, Allergic/Imm, Heme/Lymph   Last edited by Matthew Folks, COA on 12/22/2019  2:25 PM. (History)       ALLERGIES Allergies  Allergen Reactions  . Gluten Meal Swelling  . Lisinopril Cough  . Pioglitazone Other (See Comments)    ELEVATED glucoses + worse chronic nausea    PAST MEDICAL HISTORY Past Medical History:  Diagnosis Date  . Abdominal bloating    likely  from diab gastroparesis.  Dr. Havery Moros started trial of reglan 03/2019.  Marland Kitchen Benign brain tumor ([place])    Cystic lesion in cerebral aqueduct region with mild hydrocephalus-- stable MRI 02/2016.  Surveillance MRI 05/2017 --dilated cerebral aqueduct related to aqueductal stenosis and subsequent mild hydrocephalus (due to the 11 mm stable cystic lesion in cerebral aqueduct---?congenitial?.  . Cataract    OU  . Dysmenorrhea    vicodin occ during first 2 days of cycle.  . Gluten intolerance    pt reports she underwent full GI w/u to r/o celiac dz  . Hepatic steatosis    ultrasound 08/2017. Hx of very mild elevation of ALT  . History of adenomatous polyp of colon  04/08/2019   recall Feb 2024  . Hyperlipidemia, mixed   . Hypertensive retinopathy of both eyes   . Insomnia   . Iron deficiency 01/2019   Hb 11.3. Hemoccults neg x 3 03/19/19. EGD and colonoscopy 04/08/19 showed NO cause for IDA.  Pt does have menorrhagia, though, so she'll see her GYN.  started FeSO4 325 qd approx 04/14/19.  . Menorrhagia    resulting in [place] 2021  . Proliferative diabetic retinopathy of both eyes ([place])    steroid injections 10/2017--improved  . Sensorineural hearing loss of left ear    Sudden left hearing loss summer 2016--no improvement with steroids 01/2015 so brain MRI done by Dr. Redmond Baseman and it showed brain tumor that was determined to be benign.  Pt's hearing not bad enough for hearing aid as of 06/2016.  . Type 2 diabetes with complication (HCC)    +microalbuminuria, diab retpthy, diabetic gastroparesis (gastric emptying study mildly abnl 03/2017).  Recommended lantus 08/2018 but pt declined. Mild microalbuminuria.  Marland Kitchen Uterine fibroid   . White coat hypertension    Past Surgical History:  Procedure Laterality Date  . ANOSCOPY  05/12/2019   Procedure: normal exam, minimal hemorrhoid disease. Hyertrophied anal papila, benign appearing, posterior midline. Surgeon: Leighton Ruff MD  . CHOLECYSTECTOMY  2000  . COLONOSCOPY  04/08/2019   5 adenomas, recall 3 yrs; no cause for IDA found.  Hypertrophied anal papillae->bx showed low grade dysplasia; GI referred her to colorectal surgeon.  . ESOPHAGOGASTRODUODENOSCOPY  04/08/2019   mild chronic reactive gastritis. H pylori NEG.  No cause for IDA found.  . GAS INSERTION Right 10/31/2018   Procedure: Insertion Of C3F8 Gas;  Surgeon: Bernarda Caffey, MD;  Location: [place];  Service: Ophthalmology;  Laterality: Right;  . GASTRIC EMPTYING SCAN  04/20/2017   Mildly abnormal, particularly the 1st hour of emptying.  Marland Kitchen MEMBRANE PEEL Right 10/31/2018   Procedure: MEMBRANE PEEL;  Surgeon: Bernarda Caffey, MD;  Location: [place];  Service:  Ophthalmology;  Laterality: Right;  . PARS PLANA VITRECTOMY Right 04/12/2018   Procedure: Right PARS PLANA VITRECTOMY WITH 25 GAUGE with intravitreal antibiotics;  Surgeon: Bernarda Caffey, MD;  Location: [place];  Service: Ophthalmology;  Laterality: Right;  . PARS PLANA VITRECTOMY Right 10/31/2018   Procedure: PARS PLANA VITRECTOMY WITH 25 GAUGE;  Surgeon: Bernarda Caffey, MD;  Location: [place];  Service: Ophthalmology;  Laterality: Right;  . PHOTOCOAGULATION WITH LASER Right 10/31/2018   Procedure: Photocoagulation With Laser;  Surgeon: Bernarda Caffey, MD;  Location: [place];  Service: Ophthalmology;  Laterality: Right;    FAMILY HISTORY Family History  Problem Relation Age of Onset  . Brain cancer Mother   . Diabetes Father   . Diabetes Maternal Grandmother   . Cataracts Maternal Grandmother   . Cervical cancer Paternal  Grandmother   . Colon cancer Maternal Grandfather 33  . Amblyopia Neg Hx   . Blindness Neg Hx   . Glaucoma Neg Hx   . Macular degeneration Neg Hx   . Retinal detachment Neg Hx   . Strabismus Neg Hx   . Retinitis pigmentosa Neg Hx   . Esophageal cancer Neg Hx   . Stomach cancer Neg Hx   . Rectal cancer Neg Hx     SOCIAL HISTORY Social History   Tobacco Use  . Smoking status: Never Smoker  . Smokeless tobacco: Never Used  Vaping Use  . Vaping Use: Never used  Substance Use Topics  . Alcohol use: No  . Drug use: No         OPHTHALMIC EXAM:  Base Eye Exam    Visual Acuity (Snellen - Linear)      Right Left   Dist [place] 20/30    Dist cc  20/40   Dist ph [place] NI    Dist ph cc  20/30 +2       Tonometry (Tonopen, 2:48 PM)      Right Left   Pressure 17 17       Pupils      Dark Light Shape React APD   Right 3 2 Round Brisk None   Left 3 2 Round Brisk None       Visual Fields (Counting fingers)      Left Right    Full Full       Extraocular Movement      Right Left    Full, Ortho Full, Ortho       Neuro/Psych    Oriented x3: Yes   Mood/Affect:  Normal       Dilation    Both eyes: 1.0% Mydriacyl, 2.5% Phenylephrine @ 2:48 PM        Slit Lamp and Fundus Exam    Slit Lamp Exam      Right Left   Lids/Lashes Dermatochalasis - upper lid, mild Meibomian gland dysfunction, Telangiectasia Dermatochalasis - upper lid, Meibomian gland dysfunction, Telangiectasia   Conjunctiva/Sclera White and quiet, sutures intact White and quiet   Cornea Clear, trace Punctate epithelial erosions, well healed temporal cataract wounds Trace Punctate epithelial erosions   Anterior Chamber Deep and quiet Deep and quiet   Iris Round and dilated, No NVI Round and dilated, No NVI   Lens PC IOL in good position, 1-2+ Posterior capsular opacification, PC folds 2+ Nuclear sclerosis, 2+ Cortical cataract   Vitreous post vitrectomy, trace pigment Vitreous syneresis, blood stained vitreous condensations improving centrally and settling inferiorly       Fundus Exam      Right Left   Disc mild Pallor, Sharp rim Pink and sharp, Compact   C/D Ratio 0.2 0.0   Macula Flat, blunted foveal reflex, mild cystic changes ST to fovea-- slightly improved, scattered MA/exudate, cluster of MA and exudates greatest ST to fovea -- persistent Flat, good foveal reflex, trace cystic changes, scattered MA; trace ERM, light focal laser changes   Vessels attenuated, Tortuous attenuated, mild Tortuousity   Periphery Attached, tractional fibrosis in SN quadrant removed, scattered DBH greatest superiorly, 360 PRP, good laser fill in 360 attached, 360 PRP scars good posterior fill in, inferior periphery obscured by new VH, scattered IRH          IMAGING AND PROCEDURES  Imaging and Procedures for @TODAY @  OCT, Retina - OU - Both Eyes       Right  Eye Quality was good. Central Foveal Thickness: 317. Progression has improved. Findings include abnormal foveal contour, epiretinal membrane, no SRF, intraretinal hyper-reflective material, vitreous traction, preretinal fibrosis (interval  improvement in temoral IRF/DME).   Left Eye Quality was good. Central Foveal Thickness: 294. Progression has improved. Findings include normal foveal contour, intraretinal fluid, no SRF, intraretinal hyper-reflective material (Interval improvement in vitreous opacities; persistent cystic changes).   Notes *Images captured and stored on drive  Diagnosis / Impression:  DME OU OD: interval improvement in temoral IRF/DME OS: Interval improvement in vitreous opacities; persistent cystic changes  Clinical management:  See below  Abbreviations: NFP - Normal foveal profile. CME - cystoid macular edema. PED - pigment epithelial detachment. IRF - intraretinal fluid. SRF - subretinal fluid. EZ - ellipsoid zone. ERM - epiretinal membrane. ORA - outer retinal atrophy. ORT - outer retinal tubulation. SRHM - subretinal hyper-reflective material         Intravitreal Injection, Pharmacologic Agent - OS - Left Eye       Time Out 12/22/2019. 3:24 PM. Confirmed correct patient, procedure, site, and patient consented.   Anesthesia Topical anesthesia was used. Anesthetic medications included Lidocaine 2%, Proparacaine 0.5%.   Procedure Preparation included 5% betadine to ocular surface, eyelid speculum. A (32g) needle was used.   Injection:  2 mg aflibercept Alfonse Flavors) SOLN   NDC: A3590391, Lot: [id number], Expiration date: 02/28/2020   Route: Intravitreal, Site: Left Eye, Waste: 0.05 mL  Post-op Post injection exam found visual acuity of at least counting fingers. The patient tolerated the procedure well. There were no complications. The patient received written and verbal post procedure care education.                ASSESSMENT/PLAN:    ICD-10-CM   1. Proliferative diabetic retinopathy of both eyes with macular edema associated with type 2 diabetes mellitus (HCC)  H63.1497 Intravitreal Injection, Pharmacologic Agent - OS - Left Eye    aflibercept (EYLEA) SOLN 2 mg  2. Retinal edema   H35.81 OCT, Retina - OU - Both Eyes  3. Vitreous hemorrhage of left eye (HCC)  H43.12 Intravitreal Injection, Pharmacologic Agent - OS - Left Eye    aflibercept (EYLEA) SOLN 2 mg  4. Right endophthalmia  H44.001   5. Vitreous hemorrhage, right eye ([place])  H43.11   6. Essential hypertension  I10   7. Hypertensive retinopathy of both eyes  H35.033   8. Combined forms of age-related cataract of left eye  H25.812   9. Pseudophakia of right eye  Z96.1     1,2.  Proliferative diabetic retinopathy w/ DME, OU  - HbA1c 8.3% (03.31.21, 12.2.20), 8.6% (06.30.20), 8.4% (02.14.20)  - s/p IVA OD #1 9.20.19, #2 (10.25.19), #3 (11.15.19), #4 (12.17.19), #5 (01.14.20), #6 (2.11.20), #7 (05.29.20), #8 (08.19.20), #9 (10.30.20), #10 (12.09.20), #11 (01.11.21), #12 (02.15.21), #13 (03.23.21), #14 (04.20.21), #15 (06.08.21), #16 (07.06.21)  - s/p IVA OS #1 9.27.19, #2 (10.25.19), #3 (11.15.19), #4 (12.17.19), #5 (01.14.20), #6 (2.11.20), #7 (04.26.20), #8 (05.29.20), #9 (06.26.20), #10 (08.05.20), #11 (11.11.20), #12 (12.09.20), #13 (01.11.21), #14 (02.15.21), #15 (03.23.21), #16 (04.20.21), #17 (06.08.21) -- IVA resistance, #18 (09.27.21)  - s/p IVE OD #1 (08.03.21) -- sample, #2 (09.10.21), #3 (10.08.21)  - S/P PRP OS (09.20.19), (5.19.20), (08.19.20), (04.28.21)  - S/P PRP OD (9.27.19 and 11.21.19), fill-in (04.14.20) (09.03.20, surgery)  - S/P focal laser OS (07.06.21)  - FA (9.20.19) shows +NVE OU and leaking MA and capillary nonperfusion  - repeat FA 11.15.19 shows NV regressing OU  -  pre-op: OD w/ VA stable at 20/25, but there is some preretinal fibrosis / tractional membranes just superior to disc and mild central DME  - s/p 25g PPV+MP+10% C3F8 gas OD (09.03.20) -- ERM/PRF removal OD  - BCVA stable at 20/30 OD              - fibrosis/ERM stably improved; retina attached  - OCT shows OD: interval improvement in temoral IRF/DME; OS: Interval improvement in vitreous opacities and persistent cystic  changes  - recommend IVE OS #1 (10.25.21) for PDR w/ DME and VH  - pt wishes to proceed with injection  - RBA of procedure discussed, questions answered  `- informed consent obtained and signed  - see procedure note  - Avastin informed consent form signed and scanned on 01.11.2021 (OU)  - Eylea informed consent form signed and scanned on 08.03.2021  - [email or web address] benefits investigation started, 08.03.21 -- approved as of 09.10.21  - f/u 4 weeks, DFE, OCT, possible injection(s) +/- PRP laser OS  3. Vitreous hemorrhage OS -- recurrent  - recurrent VH, onset 09.22.21  - etiology: secondary to PDR as described above (no RT/RD on exam)  - s/p PRP OS (9.20.19), (05.19.20), (08.19.20), (04.28.21)  - s/p IVA OS on 4.26.20, 5.29.20, 6.26.20, 8.5.20,11.11.20, 12.06.20 and so on as above  - VH improving  - BCVA stable at 20/30 +2  - recommend IVE OS #1 (10.25.21) for PDR w/ DME and VH, as above  - f/u 4 weeks, DFE, OCT, possible injection(s) +/- PRP laser OS  4. History of Endophthalmitis OD  - s/p IVA OU 04/09/2018  - s/p 25g PPV w/ intravitreal vanc, ceftaz and cefepime OD, 2.14.2020  - s/p intravitreal tap / vanc and ceflaz injections (02.16.20)             - gram stain (2.14.20) shows G+ cocci, WBCs mostly PMNs;   - repeat gram stain from t/i (2.16.20) -- no organisms, just WBCs             - cultures from vitreous grew rare Staph warneri; cultures from t/i -- no growth             - doing well, BCVA 20/30             - inflammation/posterior debris resolved             - IOP 17 off Brimonidine  - completed po pred taper -- caused significant elevations in BG  - monitor  5. History of Vitreous Hemorrhage OD -- cleared from PPV x2 for endophthalmitis and ERM/preretinal fibrosis  - secondary to PDR as described below  - S/P IVA OD #1 (09.20.19), #2 (10.25.19), #3 (11.15.19), #4 (12.16.19), #5 (03/12/2018)  - S/P PRP OD #1 (09.27.19), fill in OD (11.21.19) -- each somewhat limited inferiorly by  residual VH  6,7. Hypertensive retinopathy OU  - BP elevated today (9.27.21) -- 164/91, left arm  - discussed importance of tight BP control.  - monitor  8. Combined form age related cataract OS-   - The symptoms of cataract, surgical options, and treatments and risks were discussed with patient.  - discussed diagnosis and progression  9. Pseudophakia OD  - s/p CE/IOL OD (Dr. Zenia Resides, 12.11.20)  - beautiful surgery, doing well   Ophthalmic Meds Ordered this visit:  Meds ordered this encounter  Medications  . aflibercept (EYLEA) SOLN 2 mg       Return in about 4 weeks (around 01/19/2020) for f/u PDR OU,  DFE, OCT.  There are no Patient Instructions on file for this visit.   This document serves as a record of services personally performed by Gardiner Sleeper, MD, PhD. It was created on their behalf by Leeann Must, Tea, an ophthalmic technician. The creation of this record is the provider's dictation and/or activities during the visit.    Electronically signed by: Leeann Must, COA @TODAY @ 9:09 PM  Gardiner Sleeper, M.D., Ph.D. Diseases & Surgery of the Retina and Vitreous [facility] 12/22/2019   I have reviewed the above documentation for accuracy and completeness, and I agree with the above. Gardiner Sleeper, M.D., Ph.D. 12/22/19 9:09 PM   Abbreviations: M myopia (nearsighted); A astigmatism; H hyperopia (farsighted); P presbyopia; Mrx spectacle prescription;  CTL contact lenses; OD right eye; OS left eye; OU both eyes  XT exotropia; ET esotropia; PEK punctate epithelial keratitis; PEE punctate epithelial erosions; DES dry eye syndrome; MGD meibomian gland dysfunction; ATs artificial tears; PFAT's preservative free artificial tears; [place] nuclear sclerotic cataract; PSC posterior subcapsular cataract; ERM epi-retinal membrane; PVD posterior vitreous detachment; RD retinal detachment; DM diabetes mellitus; DR diabetic retinopathy; NPDR  non-proliferative diabetic retinopathy; PDR proliferative diabetic retinopathy; CSME clinically significant macular edema; DME diabetic macular edema; dbh dot blot hemorrhages; CWS cotton wool spot; POAG primary open angle glaucoma; C/D cup-to-disc ratio; HVF humphrey visual field; GVF goldmann visual field; OCT optical coherence tomography; IOP intraocular pressure; BRVO Branch retinal vein occlusion; CRVO central retinal vein occlusion; CRAO central retinal artery occlusion; BRAO branch retinal artery occlusion; RT retinal tear; SB scleral buckle; PPV pars plana vitrectomy; VH Vitreous hemorrhage; PRP panretinal laser photocoagulation; IVK intravitreal kenalog; VMT vitreomacular traction; MH Macular hole;  NVD neovascularization of the disc; NVE neovascularization elsewhere; AREDS age related eye disease study; ARMD age related macular degeneration; POAG primary open angle glaucoma; EBMD epithelial/anterior basement membrane dystrophy; ACIOL anterior chamber intraocular lens; IOL intraocular lens; PCIOL posterior chamber intraocular lens; Phaco/IOL phacoemulsification with intraocular lens placement; [place] photorefractive keratectomy; LASIK laser assisted in situ keratomileusis; HTN hypertension; DM diabetes mellitus; COPD chronic obstructive pulmonary disease

## 2019-12-22 ENCOUNTER — Encounter (INDEPENDENT_AMBULATORY_CARE_PROVIDER_SITE_OTHER): Payer: Self-pay | Admitting: Ophthalmology

## 2019-12-22 ENCOUNTER — Ambulatory Visit (INDEPENDENT_AMBULATORY_CARE_PROVIDER_SITE_OTHER): Payer: BLUE CROSS/BLUE SHIELD | Admitting: Ophthalmology

## 2019-12-22 ENCOUNTER — Other Ambulatory Visit: Payer: Self-pay

## 2019-12-22 DIAGNOSIS — H35033 Hypertensive retinopathy, bilateral: Secondary | ICD-10-CM

## 2019-12-22 DIAGNOSIS — H4312 Vitreous hemorrhage, left eye: Secondary | ICD-10-CM | POA: Diagnosis not present

## 2019-12-22 DIAGNOSIS — Z961 Presence of intraocular lens: Secondary | ICD-10-CM

## 2019-12-22 DIAGNOSIS — H3581 Retinal edema: Secondary | ICD-10-CM | POA: Diagnosis not present

## 2019-12-22 DIAGNOSIS — E113513 Type 2 diabetes mellitus with proliferative diabetic retinopathy with macular edema, bilateral: Secondary | ICD-10-CM | POA: Diagnosis not present

## 2019-12-22 DIAGNOSIS — H44001 Unspecified purulent endophthalmitis, right eye: Secondary | ICD-10-CM | POA: Diagnosis not present

## 2019-12-22 DIAGNOSIS — H25812 Combined forms of age-related cataract, left eye: Secondary | ICD-10-CM

## 2019-12-22 DIAGNOSIS — H4311 Vitreous hemorrhage, right eye: Secondary | ICD-10-CM

## 2019-12-22 DIAGNOSIS — I10 Essential (primary) hypertension: Secondary | ICD-10-CM

## 2019-12-22 DIAGNOSIS — H25813 Combined forms of age-related cataract, bilateral: Secondary | ICD-10-CM

## 2019-12-22 MED ORDER — AFLIBERCEPT 2MG/0.05ML IZ SOLN FOR KALEIDOSCOPE
2.0000 mg | INTRAVITREAL | Status: AC | PRN
  Administered 2019-12-22: 2 mg via INTRAVITREAL

## 2019-12-24 ENCOUNTER — Other Ambulatory Visit: Payer: Self-pay | Admitting: Family Medicine

## 2019-12-26 ENCOUNTER — Other Ambulatory Visit: Payer: Self-pay | Admitting: Family Medicine

## 2020-01-02 ENCOUNTER — Other Ambulatory Visit: Payer: Self-pay | Admitting: Family Medicine

## 2020-01-06 ENCOUNTER — Other Ambulatory Visit: Payer: Self-pay | Admitting: Family Medicine

## 2020-01-19 ENCOUNTER — Other Ambulatory Visit: Payer: Self-pay

## 2020-01-19 ENCOUNTER — Encounter: Payer: Self-pay | Admitting: Family Medicine

## 2020-01-19 ENCOUNTER — Ambulatory Visit: Payer: BLUE CROSS/BLUE SHIELD | Admitting: Family Medicine

## 2020-01-19 VITALS — BP 156/84 | HR 94 | Temp 97.4°F | Resp 16 | Ht 63.0 in | Wt 169.0 lb

## 2020-01-19 DIAGNOSIS — I10 Essential (primary) hypertension: Secondary | ICD-10-CM

## 2020-01-19 DIAGNOSIS — Z862 Personal history of diseases of the blood and blood-forming organs and certain disorders involving the immune mechanism: Secondary | ICD-10-CM

## 2020-01-19 DIAGNOSIS — K3184 Gastroparesis: Secondary | ICD-10-CM

## 2020-01-19 DIAGNOSIS — Z23 Encounter for immunization: Secondary | ICD-10-CM | POA: Diagnosis not present

## 2020-01-19 DIAGNOSIS — E7800 Pure hypercholesterolemia, unspecified: Secondary | ICD-10-CM

## 2020-01-19 DIAGNOSIS — E118 Type 2 diabetes mellitus with unspecified complications: Secondary | ICD-10-CM

## 2020-01-19 LAB — CBC WITH DIFFERENTIAL/PLATELET
Basophils Absolute: 0.1 10*3/uL (ref 0.0–0.1)
Basophils Relative: 1 % (ref 0.0–3.0)
Eosinophils Absolute: 0.5 10*3/uL (ref 0.0–0.7)
Eosinophils Relative: 7.4 % — ABNORMAL HIGH (ref 0.0–5.0)
HCT: 37.4 % (ref 36.0–46.0)
Hemoglobin: 12.3 g/dL (ref 12.0–15.0)
Lymphocytes Relative: 25.3 % (ref 12.0–46.0)
Lymphs Abs: 1.8 10*3/uL (ref 0.7–4.0)
MCHC: 32.9 g/dL (ref 30.0–36.0)
MCV: 86.6 fl (ref 78.0–100.0)
Monocytes Absolute: 0.5 10*3/uL (ref 0.1–1.0)
Monocytes Relative: 6.7 % (ref 3.0–12.0)
Neutro Abs: 4.2 10*3/uL (ref 1.4–7.7)
Neutrophils Relative %: 59.6 % (ref 43.0–77.0)
Platelets: 429 10*3/uL — ABNORMAL HIGH (ref 150.0–400.0)
RBC: 4.32 Mil/uL (ref 3.87–5.11)
RDW: 14.2 % (ref 11.5–15.5)
WBC: 7 10*3/uL (ref 4.0–10.5)

## 2020-01-19 LAB — COMPREHENSIVE METABOLIC PANEL
ALT: 47 U/L — ABNORMAL HIGH (ref 0–35)
AST: 39 U/L — ABNORMAL HIGH (ref 0–37)
Albumin: 4 g/dL (ref 3.5–5.2)
Alkaline Phosphatase: 75 U/L (ref 39–117)
BUN: 13 mg/dL (ref 6–23)
CO2: 28 mEq/L (ref 19–32)
Calcium: 9.8 mg/dL (ref 8.4–10.5)
Chloride: 100 mEq/L (ref 96–112)
Creatinine, Ser: 0.53 mg/dL (ref 0.40–1.20)
GFR: 107.28 mL/min (ref >60.00)
Glucose, Bld: 265 mg/dL — ABNORMAL HIGH (ref 70–99)
Potassium: 4.4 mEq/L (ref 3.5–5.1)
Sodium: 137 mEq/L (ref 135–145)
Total Bilirubin: 0.3 mg/dL (ref 0.2–1.2)
Total Protein: 6.5 g/dL (ref 6.0–8.3)

## 2020-01-19 LAB — HEMOGLOBIN A1C: Hgb A1c MFr Bld: 11 % — ABNORMAL HIGH (ref 4.6–6.5)

## 2020-01-19 MED ORDER — METOCLOPRAMIDE HCL 5 MG PO TABS: 5.0000 mg | ORAL_TABLET | Freq: Three times a day (TID) | ORAL | 1 refills | Status: DC

## 2020-01-19 MED ORDER — METOPROLOL SUCCINATE ER 100 MG PO TB24
ORAL_TABLET | ORAL | 6 refills | Status: DC
Start: 2020-01-19 — End: 2020-08-25

## 2020-01-19 MED ORDER — LANTUS SOLOSTAR 100 UNIT/ML ~~LOC~~ SOPN: PEN_INJECTOR | SUBCUTANEOUS | 6 refills | Status: DC

## 2020-01-19 NOTE — Patient Instructions (Signed)
Start lantus insulin at 10 Units every night at bedtime. Increase by 1 unit every day until fasting glucose is consistently in the 100-110 range.  Increase metoprolol to 1 and 1/2 tabs once a day. If tolerating this dose x 3-4 days and bp not to goal of <130 over<80 then increase to two whole tabs every day.

## 2020-01-19 NOTE — Progress Notes (Signed)
[facility] Note  01/20/2020     CHIEF COMPLAINT Patient presents for Retina Follow Up   HISTORY OF PRESENT ILLNESS: Gloria Gomez is a 49 y.o. female who presents to the clinic today for:   HPI    Retina Follow Up    Patient presents with  Diabetic Retinopathy.  In both eyes.  This started months ago.  Severity is moderate.  Duration of 4 weeks.  Since onset it is stable.  I, the attending physician,  performed the HPI with the patient and updated documentation appropriately.          Comments    49 y/o female pt here for 4 wk f/u for PDR w/DME OU.  Feels VA OU may be slightly worse since last visit.  Denies pain, FOL, floaters.  No gtts.  BS 171 yesterday.  A1C unknown.       Last edited by Bernarda Caffey, MD on 01/20/2020  4:59 PM. (History)       Referring physician:   HISTORICAL INFORMATION:   Selected notes from the MEDICAL RECORD NUMBER Referred for DM exam   CURRENT MEDICATIONS: No current outpatient medications on file. (Ophthalmic Drugs)   No current facility-administered medications for this visit. (Ophthalmic Drugs)   Current Outpatient Medications (Other)  Medication Sig  . HYDROcodone-acetaminophen (NORCO/VICODIN) 5-325 MG tablet Take 1-2 tablets by mouth every 6 (six) hours as needed for moderate pain.  Marland Kitchen insulin glargine (LANTUS SOLOSTAR) 100 UNIT/ML Solostar Pen 10 Units SQ qhs, patient will be gradually titrating this dose to get to target fasting glucose range of 100-110  . JANUMET 50-1000 MG tablet TAKE 1 TABLET BY MOUTH TWICE DAILY WITH A MEAL  . liraglutide (VICTOZA) 18 MG/3ML SOPN Inject 0.2 mLs (1.2 mg total) into the skin daily. Inject 0.6mg  SQ daily for 1 week, then increase to 1.2mg  SQ daily.  Marland Kitchen losartan (COZAAR) 100 MG tablet Take 1 tablet by mouth once daily  . metoCLOPramide (REGLAN) 5 MG tablet Take 1 tablet (5 mg total) by mouth 4 (four) times daily -  before meals and at bedtime.  . metoprolol succinate  (TOPROL-XL) 100 MG 24 hr tablet 2 tabs po qd  . Multiple Vitamin (MULTIVITAMIN WITH MINERALS) TABS tablet Take 1 tablet by mouth daily.  Marland Kitchen OVER THE COUNTER MEDICATION Take 2 capsules by mouth daily. Neu Remedy   . pantoprazole (PROTONIX) 40 MG tablet Take 1 tablet by mouth once daily  . promethazine (PHENERGAN) 12.5 MG tablet 1-2 tabs po q6h prn nausea  . rosuvastatin (CRESTOR) 10 MG tablet Take 1 tablet (10 mg total) by mouth daily. (Patient taking differently: Take 10 mg by mouth every other day. )   No current facility-administered medications for this visit. (Other)      REVIEW OF SYSTEMS: ROS    Positive for: Endocrine, Eyes   Negative for: Constitutional, Gastrointestinal, Neurological, Skin, Genitourinary, Musculoskeletal, HENT, Cardiovascular, Respiratory, Psychiatric, Allergic/Imm, Heme/Lymph   Last edited by Matthew Folks, COA on 01/20/2020  2:19 PM. (History)       ALLERGIES Allergies  Allergen Reactions  . Gluten Meal Swelling  . Lisinopril Cough  . Pioglitazone Other (See Comments)    ELEVATED glucoses + worse chronic nausea    PAST MEDICAL HISTORY Past Medical History:  Diagnosis Date  . Abdominal bloating    likely from diab gastroparesis.  Dr. Havery Moros started trial of reglan 03/2019.  Marland Kitchen Benign brain tumor ([place])    Cystic lesion in  cerebral aqueduct region with mild hydrocephalus-- stable MRI 02/2016.  Surveillance MRI 05/2017 --dilated cerebral aqueduct related to aqueductal stenosis and subsequent mild hydrocephalus (due to the 11 mm stable cystic lesion in cerebral aqueduct---?congenitial?.  . Cataract    OU  . Dysmenorrhea    vicodin occ during first 2 days of cycle.  . Gluten intolerance    pt reports she underwent full GI w/u to r/o celiac dz  . Hepatic steatosis    ultrasound 08/2017. Hx of very mild elevation of ALT  . History of adenomatous polyp of colon 04/08/2019   recall Feb 2024  . Hyperlipidemia, mixed   . Hypertensive retinopathy of  both eyes   . Insomnia   . Iron deficiency 01/2019   Hb 11.3. Hemoccults neg x 3 03/19/19. EGD and colonoscopy 04/08/19 showed NO cause for IDA.  Pt does have menorrhagia, though, so she'll see her GYN.  started FeSO4 325 qd approx 04/14/19.  . Menorrhagia    resulting in [place] 2021  . Proliferative diabetic retinopathy of both eyes ([place])    steroid injections 10/2017--improved  . Sensorineural hearing loss of left ear    Sudden left hearing loss summer 2016--no improvement with steroids 01/2015 so brain MRI done by Dr. Redmond Baseman and it showed brain tumor that was determined to be benign.  Pt's hearing not bad enough for hearing aid as of 06/2016.  . Type 2 diabetes with complication (HCC)    +microalbuminuria, diab retpthy, diabetic gastroparesis (gastric emptying study mildly abnl 03/2017).  Recommended lantus 08/2018 but pt declined. Mild microalbuminuria.  Marland Kitchen Uterine fibroid   . White coat hypertension    Past Surgical History:  Procedure Laterality Date  . ANOSCOPY  05/12/2019   Procedure: normal exam, minimal hemorrhoid disease. Hyertrophied anal papila, benign appearing, posterior midline. Surgeon: Leighton Ruff MD  . CHOLECYSTECTOMY  2000  . COLONOSCOPY  04/08/2019   5 adenomas, recall 3 yrs; no cause for IDA found.  Hypertrophied anal papillae->bx showed low grade dysplasia; GI referred her to colorectal surgeon.  . ESOPHAGOGASTRODUODENOSCOPY  04/08/2019   mild chronic reactive gastritis. H pylori NEG.  No cause for IDA found.  . GAS INSERTION Right 10/31/2018   Procedure: Insertion Of C3F8 Gas;  Surgeon: Bernarda Caffey, MD;  Location: [facility];  Service: Ophthalmology;  Laterality: Right;  . GASTRIC EMPTYING SCAN  04/20/2017   Mildly abnormal, particularly the 1st hour of emptying.  Marland Kitchen MEMBRANE PEEL Right 10/31/2018   Procedure: MEMBRANE PEEL;  Surgeon: Bernarda Caffey, MD;  Location: [place];  Service: Ophthalmology;  Laterality: Right;  . PARS PLANA VITRECTOMY Right 04/12/2018   Procedure: Right PARS  PLANA VITRECTOMY WITH 25 GAUGE with intravitreal antibiotics;  Surgeon: Bernarda Caffey, MD;  Location: [place];  Service: Ophthalmology;  Laterality: Right;  . PARS PLANA VITRECTOMY Right 10/31/2018   Procedure: PARS PLANA VITRECTOMY WITH 25 GAUGE;  Surgeon: Bernarda Caffey, MD;  Location: [place];  Service: Ophthalmology;  Laterality: Right;  . PHOTOCOAGULATION WITH LASER Right 10/31/2018   Procedure: Photocoagulation With Laser;  Surgeon: Bernarda Caffey, MD;  Location: [place];  Service: Ophthalmology;  Laterality: Right;    FAMILY HISTORY Family History  Problem Relation Age of Onset  . Brain cancer Mother   . Diabetes Father   . Diabetes Maternal Grandmother   . Cataracts Maternal Grandmother   . Cervical cancer Paternal Grandmother   . Colon cancer Maternal Grandfather 24  . Amblyopia Neg Hx   . Blindness Neg Hx   .  Glaucoma Neg Hx   . Macular degeneration Neg Hx   . Retinal detachment Neg Hx   . Strabismus Neg Hx   . Retinitis pigmentosa Neg Hx   . Esophageal cancer Neg Hx   . Stomach cancer Neg Hx   . Rectal cancer Neg Hx     SOCIAL HISTORY Social History   Tobacco Use  . Smoking status: Never Smoker  . Smokeless tobacco: Never Used  Vaping Use  . Vaping Use: Never used  Substance Use Topics  . Alcohol use: No  . Drug use: No         OPHTHALMIC EXAM:  Base Eye Exam    Visual Acuity (Snellen - Linear)      Right Left   Dist [place] 20/30 +2    Dist cc  20/25 -2   Dist ph Rankin NI    Dist ph cc  NI       Tonometry (Tonopen, 2:40 PM)      Right Left   Pressure 15 16       Pupils      Dark Light Shape React APD   Right 3 2 Round Brisk None   Left 3 2 Round Brisk None       Visual Fields (Counting fingers)      Left Right    Full Full       Extraocular Movement      Right Left    Full, Ortho Full, Ortho       Neuro/Psych    Oriented x3: Yes   Mood/Affect: Normal       Dilation    Both eyes: 1.0% Mydriacyl, 2.5% Phenylephrine @ 2:40 PM        Slit Lamp  and Fundus Exam    Slit Lamp Exam      Right Left   Lids/Lashes Dermatochalasis - upper lid, mild Meibomian gland dysfunction, Telangiectasia Dermatochalasis - upper lid, Meibomian gland dysfunction, Telangiectasia   Conjunctiva/Sclera White and quiet, sutures intact White and quiet   Cornea Clear, trace Punctate epithelial erosions, well healed temporal cataract wounds Trace Punctate epithelial erosions   Anterior Chamber Deep and quiet Deep and quiet   Iris Round and dilated, No NVI Round and dilated, No NVI   Lens PC IOL in good position, 1-2+ Posterior capsular opacification, PC folds 2+ Nuclear sclerosis, 2+ Cortical cataract   Vitreous post vitrectomy, trace pigment Vitreous syneresis, blood stained vitreous condensations improving centrally and settling inferiorly       Fundus Exam      Right Left   Disc mild Pallor, Sharp rim Pink and sharp, Compact   C/D Ratio 0.2 0.0   Macula Flat, blunted foveal reflex, mild cystic changes ST to fovea-- slightly increased, scattered MA/exudate, cluster of MA and exudates greatest ST to fovea -- persistent Flat, good foveal reflex, trace cystic changes, scattered MA; trace ERM, light focal laser changes   Vessels attenuated, Tortuous attenuated, mild Tortuousity   Periphery Attached, scattered DBH greatest superiorly, 360 PRP, good laser fill in 360 attached, 360 PRP scars good posterior fill in, inferior periphery obscured by persistent VH, scattered IRH          IMAGING AND PROCEDURES  Imaging and Procedures for @TODAY @  OCT, Retina - OU - Both Eyes       Right Eye Quality was good. Central Foveal Thickness: 332. Progression has worsened. Findings include abnormal foveal contour, epiretinal membrane, no SRF, intraretinal hyper-reflective material (interval increase in temoral IRF/DME).  Left Eye Quality was good. Central Foveal Thickness: 281. Progression has improved. Findings include normal foveal contour, intraretinal fluid, no SRF,  intraretinal hyper-reflective material (Interval improvement in vitreous opacities; persistent cystic changes).   Notes *Images captured and stored on drive  Diagnosis / Impression:  DME OU OD: interval increase in temoral IRF/DME OS: Interval improvement in vitreous opacities; persistent cystic changes  Clinical management:  See below  Abbreviations: NFP - Normal foveal profile. CME - cystoid macular edema. PED - pigment epithelial detachment. IRF - intraretinal fluid. SRF - subretinal fluid. EZ - ellipsoid zone. ERM - epiretinal membrane. ORA - outer retinal atrophy. ORT - outer retinal tubulation. SRHM - subretinal hyper-reflective material         Intravitreal Injection, Pharmacologic Agent - OD - Right Eye       Time Out 01/20/2020. 3:20 PM. Confirmed correct patient, procedure, site, and patient consented.   Anesthesia Topical anesthesia was used. Anesthetic medications included Lidocaine 2%, Proparacaine 0.5%.   Procedure Preparation included 5% betadine to ocular surface, eyelid speculum. A (32g) needle was used.   Injection:  2 mg aflibercept Alfonse Flavors) SOLN   NDC: A3590391, Lot: [id number], Expiration date: 04/26/2020   Route: Intravitreal, Site: Right Eye, Waste: 0.05 mL  Post-op Post injection exam found visual acuity of at least counting fingers. The patient tolerated the procedure well. There were no complications. The patient received written and verbal post procedure care education. Post injection medications were not given.        Intravitreal Injection, Pharmacologic Agent - OS - Left Eye       Time Out 01/20/2020. 3:21 PM. Confirmed correct patient, procedure, site, and patient consented.   Anesthesia Topical anesthesia was used. Anesthetic medications included Lidocaine 2%, Proparacaine 0.5%.   Procedure Preparation included 5% betadine to ocular surface, eyelid speculum. A (32g) needle was used.   Injection:  2 mg aflibercept Alfonse Flavors) SOLN    NDC: M7179715, Lot: [id number], Expiration date: 11/26/2020   Route: Intravitreal, Site: Left Eye, Waste: 0.05 mL  Post-op Post injection exam found visual acuity of at least counting fingers. The patient tolerated the procedure well. There were no complications. The patient received written and verbal post procedure care education. Post injection medications were not given.                ASSESSMENT/PLAN:    ICD-10-CM   1. Proliferative diabetic retinopathy of both eyes with macular edema associated with type 2 diabetes mellitus (HCC)  R15.4008 Intravitreal Injection, Pharmacologic Agent - OD - Right Eye    Intravitreal Injection, Pharmacologic Agent - OS - Left Eye    aflibercept (EYLEA) SOLN 2 mg    aflibercept (EYLEA) SOLN 2 mg  2. Retinal edema  H35.81 OCT, Retina - OU - Both Eyes  3. Vitreous hemorrhage of left eye (HCC)  H43.12   4. Right endophthalmia  H44.001   5. Vitreous hemorrhage, right eye ([place])  H43.11   6. Essential hypertension  I10   7. Hypertensive retinopathy of both eyes  H35.033   8. Combined forms of age-related cataract of left eye  H25.812   9. Pseudophakia of right eye  Z96.1    1,2.  Proliferative diabetic retinopathy w/ DME, OU  - HbA1c 11.0% (11.22.21), 8.3% (03.31.21, 12.2.20), 8.6% (06.30.20), 8.4% (02.14.20)  - s/p IVA OD #1 9.20.19, #2 (10.25.19), #3 (11.15.19), #4 (12.17.19), #5 (01.14.20), #6 (2.11.20), #7 (05.29.20), #8 (08.19.20), #9 (10.30.20), #10 (12.09.20), #11 (01.11.21), #12 (02.15.21), #13 (03.23.21), #14 (04.20.21), #15 (  06.08.21), #16 (07.06.21)  - s/p IVA OS #1 9.27.19, #2 (10.25.19), #3 (11.15.19), #4 (12.17.19), #5 (01.14.20), #6 (2.11.20), #7 (04.26.20), #8 (05.29.20), #9 (06.26.20), #10 (08.05.20), #11 (11.11.20), #12 (12.09.20), #13 (01.11.21), #14 (02.15.21), #15 (03.23.21), #16 (04.20.21), #17 (06.08.21) -- IVA resistance, #18 (09.27.21)  - s/p IVE OD #1 (08.03.21) -- sample, #2 (09.10.21), #3 (10.08.21)  - s/p IVE OS #1  (10.25.21)  - S/P PRP OS (09.20.19), (5.19.20), (08.19.20), (04.28.21)  - S/P PRP OD (9.27.19 and 11.21.19), fill-in (04.14.20) (09.03.20, surgery)  - S/P focal laser OS (07.06.21)  - FA (9.20.19) shows +NVE OU and leaking MA and capillary nonperfusion  - repeat FA 11.15.19 shows NV regressing OU  - pre-op: OD w/ VA stable at 20/25, but there is some preretinal fibrosis / tractional membranes just superior to disc and mild central DME  - s/p 25g PPV+MP+10% C3F8 gas OD (09.03.20) -- ERM/PRF removal OD  - BCVA stable at 20/30 OD              - fibrosis/ERM stably improved; retina attached  - OCT shows OD: interval increase in temoral IRF/DME; OS: Interval improvement in vitreous opacities and persistent cystic changes  - recommend IVE OD #4, OS #2 (11.23.21) for PDR w/ DME and VH  - pt wishes to proceed with injection  - RBA of procedure discussed, questions answered  - informed consent obtained and signed  - see procedure note  - Avastin informed consent form signed and scanned on 01.11.2021 (OU)  - Eylea informed consent form signed and scanned on 08.03.2021  - Eylea4U benefits investigation started, 08.03.21 -- approved as of 09.10.21  - f/u 4 weeks, DFE, OCT, possible injection(s) +/- PRP laser OS  3. Vitreous hemorrhage OS -- recurrent  - recurrent VH, onset 09.22.21  - etiology: secondary to PDR as described above (no RT/RD on exam)  - s/p PRP OS (9.20.19), (05.19.20), (08.19.20), (04.28.21)  - s/p IVA OS on 4.26.20, 5.29.20, 6.26.20, 8.5.20,11.11.20, 12.06.20 and so on as above  - VH improving  - BCVA stable at 20/25-2  - recommend IVE OS #2 (11.23.21) for PDR w/ DME and VH, as above  - f/u 4 weeks, DFE, OCT, possible injection(s) +/- PRP laser OS  4. History of Endophthalmitis OD  - s/p IVA OU 04/09/2018  - s/p 25g PPV w/ intravitreal vanc, ceftaz and cefepime OD, 2.14.2020  - s/p intravitreal tap / vanc and ceflaz injections (02.16.20)             - gram stain (2.14.20) shows  G+ cocci, WBCs mostly PMNs;   - repeat gram stain from t/i (2.16.20) -- no organisms, just WBCs             - cultures from vitreous grew rare Staph warneri; cultures from t/i -- no growth             - doing well, BCVA 20/30             - inflammation/posterior debris resolved             - IOP 17 off Brimonidine  - completed po pred taper -- caused significant elevations in BG  - monitor  5. History of Vitreous Hemorrhage OD -- cleared from PPV x2 for endophthalmitis and ERM/preretinal fibrosis  - secondary to PDR as described below  - S/P IVA OD #1 (09.20.19), #2 (10.25.19), #3 (11.15.19), #4 (12.16.19), #5 (03/12/2018)  - S/P PRP OD #1 (09.27.19), fill in OD (11.21.19) -- each  somewhat limited inferiorly by residual VH  6,7. Hypertensive retinopathy OU  - BP elevated today (9.27.21) -- 164/91, left arm  - discussed importance of tight BP control.  - monitor  8. Combined form age related cataract OS-   - The symptoms of cataract, surgical options, and treatments and risks were discussed with patient.  - discussed diagnosis and progression  9. Pseudophakia OD  - s/p CE/IOL OD (Dr. Zenia Resides, 12.11.20)  - beautiful surgery, doing well  Ophthalmic Meds Ordered this visit:  Meds ordered this encounter  Medications  . aflibercept (EYLEA) SOLN 2 mg  . aflibercept (EYLEA) SOLN 2 mg      Return in about 4 weeks (around 02/17/2020) for 4 wk f/u for PDR w/DME OU w/DFE&OCT.  There are no Patient Instructions on file for this visit.  This document serves as a record of services personally performed by Gardiner Sleeper, MD, PhD. It was created on their behalf by San Jetty. Owens Shark, OA an ophthalmic technician. The creation of this record is the provider's dictation and/or activities during the visit.    Electronically signed by: San Jetty. Owens Shark, [place] 11.22.2021 5:03 PM   This document serves as a record of services personally performed by Gardiner Sleeper, MD, PhD. It was created on their  behalf by Estill Bakes, COT an ophthalmic technician. The creation of this record is the provider's dictation and/or activities during the visit.    Electronically signed by: Estill Bakes, [place] 11.22.21 @ 5:03 PM  Gardiner Sleeper, M.D., Ph.D. Diseases & Surgery of the Retina and [place] 11.23.21  I have reviewed the above documentation for accuracy and completeness, and I agree with the above. Gardiner Sleeper, M.D., Ph.D. 01/20/20 5:03 PM   Abbreviations: M myopia (nearsighted); A astigmatism; H hyperopia (farsighted); P presbyopia; Mrx spectacle prescription;  CTL contact lenses; OD right eye; OS left eye; OU both eyes  XT exotropia; ET esotropia; PEK punctate epithelial keratitis; PEE punctate epithelial erosions; DES dry eye syndrome; MGD meibomian gland dysfunction; ATs artificial tears; PFAT's preservative free artificial tears; [place] nuclear sclerotic cataract; PSC posterior subcapsular cataract; ERM epi-retinal membrane; PVD posterior vitreous detachment; RD retinal detachment; DM diabetes mellitus; DR diabetic retinopathy; NPDR non-proliferative diabetic retinopathy; PDR proliferative diabetic retinopathy; CSME clinically significant macular edema; DME diabetic macular edema; dbh dot blot hemorrhages; CWS cotton wool spot; POAG primary open angle glaucoma; C/D cup-to-disc ratio; HVF humphrey visual field; GVF goldmann visual field; OCT optical coherence tomography; IOP intraocular pressure; BRVO Branch retinal vein occlusion; CRVO central retinal vein occlusion; CRAO central retinal artery occlusion; BRAO branch retinal artery occlusion; RT retinal tear; SB scleral buckle; PPV pars plana vitrectomy; VH Vitreous hemorrhage; PRP panretinal laser photocoagulation; IVK intravitreal kenalog; VMT vitreomacular traction; MH Macular hole;  NVD neovascularization of the disc; NVE neovascularization elsewhere; AREDS age related eye disease study; ARMD age related macular  degeneration; POAG primary open angle glaucoma; EBMD epithelial/anterior basement membrane dystrophy; ACIOL anterior chamber intraocular lens; IOL intraocular lens; PCIOL posterior chamber intraocular lens; Phaco/IOL phacoemulsification with intraocular lens placement; [place] photorefractive keratectomy; LASIK laser assisted in situ keratomileusis; HTN hypertension; DM diabetes mellitus; COPD chronic obstructive pulmonary disease

## 2020-01-19 NOTE — Progress Notes (Signed)
OFFICE VISIT  02/03/2020  CC:  Chief Complaint  Patient presents with  . Follow-up    RCI, pt is not fasting   HPI:    Patient is a 49 y.o. Caucasian female who presents for 4 mo f/u DM, HTN, HLD, hx of IDA secondary to menorrhagia. A/P as of last visit: "Type 2 diabetes mellitus with retinopathy of both eyes, without long-term current use of insulin, macular edema presence unspecified, unspecified retinopathy severity ([place]) - Plan: Hemoglobin A1c, Comprehensive metabolic panel  Essential hypertension - Plan: Comprehensive metabolic panel  Hypercholesterolemia - Plan: Lipid panel, Comprehensive metabolic panel  Hepatic steatosis - Plan: Comprehensive metabolic panel  Elevated transaminase level - Plan: Comprehensive metabolic panel  IDA: continuing iron, in the midst of GYN eval for menorrhagia. GI eval normal.  PLAN: No new mgmt today. Check future labs. Will look into possible substitute for her invokana since insurer denied coverage despite prior auth submission. BPs pretty stable. Recent fluid retention possibly hormone related b/c she tends to get this prob with menses. Continue with GYN w/u for her menorrhagia, cont iron supp and periodic Hb monitoring via GYn. I'll check Hb at next f/u with me."  INTERIM HX: More prob x 1 mo, n/v starting early in morning, occurs almost daily, gets better by the middle of day.  Promethazine helps a little.  NO abd pain but "feels like a bowling ball in there".   Was on reglan but says it caused some loose stools, helped n/v though, was on it about 6 wks or so.   Glucoses have been higher over this time-"in the 200s" for at least a month.  Some polyuria but no polydipsia.  More swelling of feet and ankles lately but ok today.  Diet is pretty good.   Final appeal to insurer to get invokana failed.   No burning, tingling, or numbness in feet.  Takes 1 FeSo4 325mg  qd. Menses are infrequent but when it occurrs the bleeding is very  heavy x 2-3d.  BPs 140s-150s syst over mid 80s for the last 1-2 months.  No HR data available. Compliant with toprol xl 100mg  qd and losartan 100 qd.  ROS: no fevers, no CP, no SOB, no wheezing, no cough, no dizziness, no HAs, no rashes, no melena/hematochezia.  No polyuria or polydipsia.  No myalgias or arthralgias.  No focal weakness, paresthesias, or tremors.  No acute vision or hearing abnormalities. No palpitations.     Past Medical History:  Diagnosis Date  . Abdominal bloating    likely from diab gastroparesis.  Dr. Havery Moros started trial of reglan 03/2019.  Marland Kitchen Benign brain tumor ([place])    Cystic lesion in cerebral aqueduct region with mild hydrocephalus-- stable MRI 02/2016.  Surveillance MRI 05/2017 --dilated cerebral aqueduct related to aqueductal stenosis and subsequent mild hydrocephalus (due to the 11 mm stable cystic lesion in cerebral aqueduct---?congenitial?.  . Cataract    OU  . Dysmenorrhea    vicodin occ during first 2 days of cycle.  . Gluten intolerance    pt reports she underwent full GI w/u to r/o celiac dz  . Hepatic steatosis    ultrasound 08/2017. Hx of very mild elevation of ALT  . History of adenomatous polyp of colon 04/08/2019   recall Feb 2024  . Hyperlipidemia, mixed   . Hypertensive retinopathy of both eyes   . Insomnia   . Iron deficiency 01/2019   Hb 11.3. Hemoccults neg x 3 03/19/19. EGD and colonoscopy 04/08/19 showed NO cause  for IDA.  Pt does have menorrhagia, though, so she'll see her GYN.  started FeSO4 325 qd approx 04/14/19.  . Menorrhagia    resulting in [place] 2021  . Proliferative diabetic retinopathy of both eyes ([place])    steroid injections 10/2017--improved  . Sensorineural hearing loss of left ear    Sudden left hearing loss summer 2016--no improvement with steroids 01/2015 so brain MRI done by Dr. Redmond Baseman and it showed brain tumor that was determined to be benign.  Pt's hearing not bad enough for hearing aid as of 06/2016.  . Type 2 diabetes with  complication ([facility])    +microalbuminuria, diab retpthy, diabetic gastroparesis (gastric emptying study mildly abnl 03/2017).  Recommended lantus 08/2018 but pt declined. Mild microalbuminuria.  Marland Kitchen Uterine fibroid   . White coat hypertension     Past Surgical History:  Procedure Laterality Date  . ANOSCOPY  05/12/2019   Procedure: normal exam, minimal hemorrhoid disease. Hyertrophied anal papila, benign appearing, posterior midline. Surgeon: Leighton Ruff MD  . CHOLECYSTECTOMY  2000  . COLONOSCOPY  04/08/2019   5 adenomas, recall 3 yrs; no cause for IDA found.  Hypertrophied anal papillae->bx showed low grade dysplasia; GI referred her to colorectal surgeon.  . ESOPHAGOGASTRODUODENOSCOPY  04/08/2019   mild chronic reactive gastritis. H pylori NEG.  No cause for IDA found.  . GAS INSERTION Right 10/31/2018   Procedure: Insertion Of C3F8 Gas;  Surgeon: Bernarda Caffey, MD;  Location: [place];  Service: Ophthalmology;  Laterality: Right;  . GASTRIC EMPTYING SCAN  04/20/2017   Mildly abnormal, particularly the 1st hour of emptying.  Marland Kitchen MEMBRANE PEEL Right 10/31/2018   Procedure: MEMBRANE PEEL;  Surgeon: Bernarda Caffey, MD;  Location: [place];  Service: Ophthalmology;  Laterality: Right;  . PARS PLANA VITRECTOMY Right 04/12/2018   Procedure: Right PARS PLANA VITRECTOMY WITH 25 GAUGE with intravitreal antibiotics;  Surgeon: Bernarda Caffey, MD;  Location: [place];  Service: Ophthalmology;  Laterality: Right;  . PARS PLANA VITRECTOMY Right 10/31/2018   Procedure: PARS PLANA VITRECTOMY WITH 25 GAUGE;  Surgeon: Bernarda Caffey, MD;  Location: [place];  Service: Ophthalmology;  Laterality: Right;  . PHOTOCOAGULATION WITH LASER Right 10/31/2018   Procedure: Photocoagulation With Laser;  Surgeon: Bernarda Caffey, MD;  Location: [place];  Service: Ophthalmology;  Laterality: Right;    Outpatient Medications Prior to Visit  Medication Sig Dispense Refill  . HYDROcodone-acetaminophen (NORCO/VICODIN) 5-325 MG tablet Take 1-2 tablets  by mouth every 6 (six) hours as needed for moderate pain. 40 tablet 0  . JANUMET 50-1000 MG tablet TAKE 1 TABLET BY MOUTH TWICE DAILY WITH A MEAL 180 tablet 0  . liraglutide (VICTOZA) 18 MG/3ML SOPN Inject 0.2 mLs (1.2 mg total) into the skin daily. Inject 0.6mg  SQ daily for 1 week, then increase to 1.2mg  SQ daily. 15 pen 3  . Multiple Vitamin (MULTIVITAMIN WITH MINERALS) TABS tablet Take 1 tablet by mouth daily.    Marland Kitchen OVER THE COUNTER MEDICATION Take 2 capsules by mouth daily. Neu Remedy     . promethazine (PHENERGAN) 12.5 MG tablet 1-2 tabs po q6h prn nausea 30 tablet 1  . rosuvastatin (CRESTOR) 10 MG tablet Take 1 tablet (10 mg total) by mouth daily. (Patient taking differently: Take 10 mg by mouth every other day. ) 30 tablet 4  . losartan (COZAAR) 100 MG tablet Take 1 tablet by mouth once daily 90 tablet 0  . metoCLOPramide (REGLAN) 5 MG tablet Take 1 tablet (5 mg total) by mouth 3 (three) times  daily with meals as needed for nausea. 60 tablet 1  . metoprolol succinate (TOPROL-XL) 100 MG 24 hr tablet TAKE 1 TABLET BY MOUTH ONCE DAILY WITH  OR  IMMEDIATELY  FOLLOWING  A  MEAL 14 tablet 0  . pantoprazole (PROTONIX) 40 MG tablet Take 1 tablet by mouth once daily 14 tablet 0   No facility-administered medications prior to visit.    Allergies  Allergen Reactions  . Gluten Meal Swelling  . Lisinopril Cough  . Pioglitazone Other (See Comments)    ELEVATED glucoses + worse chronic nausea    ROS As per HPI  PE: Vitals with BMI 01/19/2020 11/24/2019 09/22/2019  Height 5\' 3"  - -  Weight 169 lbs - 167 lbs  BMI 27.06 - -  Systolic 237 628 315  Diastolic 84 91 84  Pulse 94 93 -    Gen: Alert, well appearing.  Patient is oriented to person, place, time, and situation. AFFECT: pleasant, lucid thought and speech. CV: RRR, no m/r/g.   LUNGS: CTA bilat, nonlabored resps, good aeration in all lung fields. ABD: soft, NT, ND, BS normal.  No hepatospenomegaly or mass.  No bruits. EXT: no clubbing  or cyanosis.  no edema.  Foot exam --no swelling, tenderness or skin or vascular lesions. Color and temperature is normal. Sensation is intact. Peripheral pulses are palpable. Toenails are normal.    LABS:  Lab Results  Component Value Date   TSH 1.37 06/23/2019   Lab Results  Component Value Date   WBC 7.0 01/19/2020   HGB 12.3 01/19/2020   HCT 37.4 01/19/2020   MCV 86.6 01/19/2020   PLT 429.0 (H) 01/19/2020   Lab Results  Component Value Date   IRON 54 01/19/2020   TIBC 383 01/19/2020   FERRITIN 52 01/19/2020    Lab Results  Component Value Date   CREATININE 0.53 01/19/2020   BUN 13 01/19/2020   NA 137 01/19/2020   K 4.4 01/19/2020   CL 100 01/19/2020   CO2 28 01/19/2020   Lab Results  Component Value Date   ALT 47 (H) 01/19/2020   AST 39 (H) 01/19/2020   ALKPHOS 75 01/19/2020   BILITOT 0.3 01/19/2020   Lab Results  Component Value Date   CHOL 99 01/29/2019   Lab Results  Component Value Date   HDL 33 (L) 01/29/2019   Lab Results  Component Value Date   LDLCALC 39 01/29/2019   Lab Results  Component Value Date   TRIG 196 (H) 01/29/2019   Lab Results  Component Value Date   CHOLHDL 3.0 01/29/2019   Lab Results  Component Value Date   HGBA1C 11.0 (H) 01/19/2020    IMPRESSION AND PLAN:  1) DM 2: poor control long term, not currently on insulin. Has recurrence of symptomatic diab gastroparesis lately. Restart reglan 5mg  qid. Try to get better gluc control->start lantus 10 U qhs and titrate slowly to goal fasting gluc of 100-110 range. Hba1c, lytes/cr, and urine microalb/cr today. Feet exam normal today.  2) HTN: not well controlled. Increase metoprolol to 1 and 1/2 tabs once a day. If tolerating this dose x 3-4 days and bp not to goal of <130 over<80 then increase to two whole tabs every day. Cont losartan 100mg  qd. BMET today.  3) HLD: tolerating rosuva 10mg  qd. LDL 39 Dec 2020. Not fasting today.  Needs rpt FLP next f/u visit.   Hepatic panel today.  4) IDA (hx of), due to menorrhagia. Tolerating 325mg  feSo4 qd. CBC  and iron panel today.  An After Visit Summary was printed and given to the patient.  FOLLOW UP: Return in about 4 weeks (around 02/16/2020) for routine chronic illness f/u.  Signed:  Crissie Sickles, MD           02/03/2020

## 2020-01-20 ENCOUNTER — Ambulatory Visit (INDEPENDENT_AMBULATORY_CARE_PROVIDER_SITE_OTHER): Payer: BLUE CROSS/BLUE SHIELD | Admitting: Ophthalmology

## 2020-01-20 ENCOUNTER — Encounter (INDEPENDENT_AMBULATORY_CARE_PROVIDER_SITE_OTHER): Payer: Self-pay | Admitting: Ophthalmology

## 2020-01-20 DIAGNOSIS — E113513 Type 2 diabetes mellitus with proliferative diabetic retinopathy with macular edema, bilateral: Secondary | ICD-10-CM

## 2020-01-20 DIAGNOSIS — H4312 Vitreous hemorrhage, left eye: Secondary | ICD-10-CM

## 2020-01-20 DIAGNOSIS — H44001 Unspecified purulent endophthalmitis, right eye: Secondary | ICD-10-CM

## 2020-01-20 DIAGNOSIS — H35033 Hypertensive retinopathy, bilateral: Secondary | ICD-10-CM

## 2020-01-20 DIAGNOSIS — I10 Essential (primary) hypertension: Secondary | ICD-10-CM

## 2020-01-20 DIAGNOSIS — H3581 Retinal edema: Secondary | ICD-10-CM

## 2020-01-20 DIAGNOSIS — H4311 Vitreous hemorrhage, right eye: Secondary | ICD-10-CM

## 2020-01-20 DIAGNOSIS — Z961 Presence of intraocular lens: Secondary | ICD-10-CM

## 2020-01-20 DIAGNOSIS — H25812 Combined forms of age-related cataract, left eye: Secondary | ICD-10-CM

## 2020-01-20 LAB — IRON,TIBC AND FERRITIN PANEL
%SAT: 14 % (calc) — ABNORMAL LOW (ref 16–45)
Ferritin: 52 ng/mL (ref 16–232)
Iron: 54 ug/dL (ref 45–160)
TIBC: 383 mcg/dL (calc) (ref 250–450)

## 2020-01-20 LAB — MICROALBUMIN / CREATININE URINE RATIO
Creatinine,U: 95.1 mg/dL
Microalb Creat Ratio: 100 mg/g — ABNORMAL HIGH (ref 0.0–30.0)
Microalb, Ur: 95.1 mg/dL — ABNORMAL HIGH (ref 0.0–1.9)

## 2020-01-20 MED ORDER — AFLIBERCEPT 2MG/0.05ML IZ SOLN FOR KALEIDOSCOPE
2.0000 mg | INTRAVITREAL | Status: AC | PRN
  Administered 2020-01-20: 2 mg via INTRAVITREAL

## 2020-01-26 ENCOUNTER — Other Ambulatory Visit: Payer: Self-pay | Admitting: Family Medicine

## 2020-01-29 ENCOUNTER — Other Ambulatory Visit: Payer: Self-pay

## 2020-01-29 ENCOUNTER — Telehealth: Payer: Self-pay | Admitting: Family Medicine

## 2020-01-29 MED ORDER — LOSARTAN POTASSIUM 100 MG PO TABS
100.0000 mg | ORAL_TABLET | Freq: Every day | ORAL | 0 refills | Status: DC
Start: 2020-01-29 — End: 2020-04-26

## 2020-01-29 NOTE — Telephone Encounter (Signed)
Patient would like 90 days refill due to upcoming insurance change in January.

## 2020-01-29 NOTE — Telephone Encounter (Signed)
Patient would like refill of Losartan, at least 90 days supply due to upcoming insurance change in January.

## 2020-01-29 NOTE — Telephone Encounter (Signed)
Patient advised new Rx for 90 d supply sent.

## 2020-02-07 ENCOUNTER — Other Ambulatory Visit: Payer: Self-pay | Admitting: Family Medicine

## 2020-02-13 NOTE — Progress Notes (Signed)
[facility] Note  02/17/2020     CHIEF COMPLAINT Patient presents for Retina Follow Up   HISTORY OF PRESENT ILLNESS: Gloria Gomez is a 49 y.o. female who presents to the clinic today for:   HPI    Retina Follow Up    Patient presents with  Diabetic Retinopathy.  In both eyes.  Severity is moderate.  Duration of 4 weeks.  Since onset it is stable.  I, the attending physician,  performed the HPI with the patient and updated documentation appropriately.          Comments    Patient states vision the same OU. BS was 137 this am. Last a1c was 9.2, checked 1 month ago. A1c was elevated, patient had to change to insulin due to lack of insurance coverage for oral medications.        Last edited by Bernarda Caffey, MD on 02/17/2020  9:37 PM. (History)    pt states vision is stable OU, she states she sees a "grey-ness" in her left eye, but not really floaters   Referring physician:   HISTORICAL INFORMATION:   Selected notes from the MEDICAL RECORD NUMBER Referred for DM exam   CURRENT MEDICATIONS: No current outpatient medications on file. (Ophthalmic Drugs)   No current facility-administered medications for this visit. (Ophthalmic Drugs)   Current Outpatient Medications (Other)  Medication Sig  . HYDROcodone-acetaminophen (NORCO/VICODIN) 5-325 MG tablet Take 1-2 tablets by mouth every 6 (six) hours as needed for moderate pain.  Marland Kitchen insulin glargine (LANTUS SOLOSTAR) 100 UNIT/ML Solostar Pen 10 Units SQ qhs, patient will be gradually titrating this dose to get to target fasting glucose range of 100-110  . JANUMET 50-1000 MG tablet TAKE 1 TABLET BY MOUTH TWICE DAILY WITH A MEAL  . losartan (COZAAR) 100 MG tablet Take 1 tablet (100 mg total) by mouth daily.  . metoCLOPramide (REGLAN) 5 MG tablet Take 1 tablet (5 mg total) by mouth 4 (four) times daily -  before meals and at bedtime.  . metoprolol succinate (TOPROL-XL) 100 MG 24 hr tablet 2 tabs po qd   . Multiple Vitamin (MULTIVITAMIN WITH MINERALS) TABS tablet Take 1 tablet by mouth daily.  Marland Kitchen OVER THE COUNTER MEDICATION Take 2 capsules by mouth daily. Neu Remedy  . pantoprazole (PROTONIX) 40 MG tablet Take 1 tablet by mouth once daily  . promethazine (PHENERGAN) 12.5 MG tablet 1-2 tabs po q6h prn nausea  . rosuvastatin (CRESTOR) 10 MG tablet Take 1 tablet (10 mg total) by mouth daily. (Patient taking differently: Take 10 mg by mouth every other day.)  . VICTOZA 18 MG/3ML SOPN INJECT 0.6MG  INTO THE SKIN DAILY FOR 1 WEEK, THEN INCREASE TO INJECT 1.2MG  DAILY   No current facility-administered medications for this visit. (Other)      REVIEW OF SYSTEMS: ROS    Positive for: Endocrine, Eyes   Negative for: Constitutional, Gastrointestinal, Neurological, Skin, Genitourinary, Musculoskeletal, HENT, Cardiovascular, Respiratory, Psychiatric, Allergic/Imm, Heme/Lymph   Last edited by Roselee Nova D, COT on 02/17/2020  2:26 PM. (History)       ALLERGIES Allergies  Allergen Reactions  . Gluten Meal Swelling  . Lisinopril Cough  . Pioglitazone Other (See Comments)    ELEVATED glucoses + worse chronic nausea    PAST MEDICAL HISTORY Past Medical History:  Diagnosis Date  . Abdominal bloating    likely from diab gastroparesis.  Dr. Havery Moros started trial of reglan 03/2019.  Marland Kitchen Benign brain tumor ([place])  Cystic lesion in cerebral aqueduct region with mild hydrocephalus-- stable MRI 02/2016.  Surveillance MRI 05/2017 --dilated cerebral aqueduct related to aqueductal stenosis and subsequent mild hydrocephalus (due to the 11 mm stable cystic lesion in cerebral aqueduct---?congenitial?.  . Cataract    OU  . Dysmenorrhea    vicodin occ during first 2 days of cycle.  . Gluten intolerance    pt reports she underwent full GI w/u to r/o celiac dz  . Hepatic steatosis    ultrasound 08/2017. Hx of very mild elevation of ALT  . History of adenomatous polyp of colon 04/08/2019   recall Feb 2024   . Hyperlipidemia, mixed   . Hypertensive retinopathy of both eyes   . Insomnia   . Iron deficiency 01/2019   Hb 11.3. Hemoccults neg x 3 03/19/19. EGD and colonoscopy 04/08/19 showed NO cause for IDA.  Pt does have menorrhagia, though, so she'll see her GYN.  started FeSO4 325 qd approx 04/14/19.  . Menorrhagia    resulting in [place] 2021  . Proliferative diabetic retinopathy of both eyes ([place])    steroid injections 10/2017--improved  . Sensorineural hearing loss of left ear    Sudden left hearing loss summer 2016--no improvement with steroids 01/2015 so brain MRI done by Dr. Redmond Baseman and it showed brain tumor that was determined to be benign.  Pt's hearing not bad enough for hearing aid as of 06/2016.  . Type 2 diabetes with complication (HCC)    +microalbuminuria, diab retpthy, diabetic gastroparesis (gastric emptying study mildly abnl 03/2017).  Recommended lantus 08/2018 but pt declined. Mild microalbuminuria.  Marland Kitchen Uterine fibroid   . White coat hypertension    Past Surgical History:  Procedure Laterality Date  . ANOSCOPY  05/12/2019   Procedure: normal exam, minimal hemorrhoid disease. Hyertrophied anal papila, benign appearing, posterior midline. Surgeon: Leighton Ruff MD  . CHOLECYSTECTOMY  2000  . COLONOSCOPY  04/08/2019   5 adenomas, recall 3 yrs; no cause for IDA found.  Hypertrophied anal papillae->bx showed low grade dysplasia; GI referred her to colorectal surgeon.  . ESOPHAGOGASTRODUODENOSCOPY  04/08/2019   mild chronic reactive gastritis. H pylori NEG.  No cause for IDA found.  . GAS INSERTION Right 10/31/2018   Procedure: Insertion Of C3F8 Gas;  Surgeon: Bernarda Caffey, MD;  Location: [place];  Service: Ophthalmology;  Laterality: Right;  . GASTRIC EMPTYING SCAN  04/20/2017   Mildly abnormal, particularly the 1st hour of emptying.  Marland Kitchen MEMBRANE PEEL Right 10/31/2018   Procedure: MEMBRANE PEEL;  Surgeon: Bernarda Caffey, MD;  Location: [place];  Service: Ophthalmology;  Laterality: Right;  . PARS  PLANA VITRECTOMY Right 04/12/2018   Procedure: Right PARS PLANA VITRECTOMY WITH 25 GAUGE with intravitreal antibiotics;  Surgeon: Bernarda Caffey, MD;  Location: [place];  Service: Ophthalmology;  Laterality: Right;  . PARS PLANA VITRECTOMY Right 10/31/2018   Procedure: PARS PLANA VITRECTOMY WITH 25 GAUGE;  Surgeon: Bernarda Caffey, MD;  Location: [place];  Service: Ophthalmology;  Laterality: Right;  . PHOTOCOAGULATION WITH LASER Right 10/31/2018   Procedure: Photocoagulation With Laser;  Surgeon: Bernarda Caffey, MD;  Location: [place];  Service: Ophthalmology;  Laterality: Right;    FAMILY HISTORY Family History  Problem Relation Age of Onset  . Brain cancer Mother   . Diabetes Father   . Diabetes Maternal Grandmother   . Cataracts Maternal Grandmother   . Cervical cancer Paternal Grandmother   . Colon cancer Maternal Grandfather 47  . Amblyopia Neg Hx   . Blindness Neg Hx   .  Glaucoma Neg Hx   . Macular degeneration Neg Hx   . Retinal detachment Neg Hx   . Strabismus Neg Hx   . Retinitis pigmentosa Neg Hx   . Esophageal cancer Neg Hx   . Stomach cancer Neg Hx   . Rectal cancer Neg Hx     SOCIAL HISTORY Social History   Tobacco Use  . Smoking status: Never Smoker  . Smokeless tobacco: Never Used  Vaping Use  . Vaping Use: Never used  Substance Use Topics  . Alcohol use: No  . Drug use: No         OPHTHALMIC EXAM:  Base Eye Exam    Visual Acuity (Snellen - Linear)      Right Left   Dist [place] 20/30 -2    Dist cc  20/25   Dist ph [place] 20/30 -1    Dist ph cc  NI   Correction: Glasses       Tonometry (Tonopen, 2:36 PM)      Right Left   Pressure 13 14       Pupils      Dark Light Shape React APD   Right 3 2 Round Brisk None   Left 3 2 Round Brisk None       Visual Fields (Counting fingers)      Left Right    Full Full       Extraocular Movement      Right Left    Full, Ortho Full, Ortho       Neuro/Psych    Oriented x3: Yes   Mood/Affect: Normal        Dilation    Both eyes: 1.0% Mydriacyl, 2.5% Phenylephrine @ 2:36 PM        Slit Lamp and Fundus Exam    Slit Lamp Exam      Right Left   Lids/Lashes Dermatochalasis - upper lid, mild Meibomian gland dysfunction, Telangiectasia Dermatochalasis - upper lid, Meibomian gland dysfunction, Telangiectasia   Conjunctiva/Sclera White and quiet, sutures intact White and quiet   Cornea Clear, trace Punctate epithelial erosions, well healed temporal cataract wounds Trace Punctate epithelial erosions   Anterior Chamber Deep and quiet Deep and quiet   Iris Round and dilated, No NVI Round and dilated, No NVI   Lens PC IOL in good position, 1-2+ Posterior capsular opacification, PC folds 2-3+ Nuclear sclerosis, 2-3+ Cortical cataract   Vitreous post vitrectomy, trace pigment Vitreous syneresis, blood stained vitreous condensations improving centrally and settling inferiorly, residual blood clots inferiorly, persistent blood stained floater just IT to disc       Fundus Exam      Right Left   Disc mild Pallor, Sharp rim Pink and sharp, Compact, PPA   C/D Ratio 0.2 0.0   Macula Flat, blunted foveal reflex, mild cystic changes ST to fovea-- slightly improved, persistent punctate exudate, scattered MA Flat, good foveal reflex, trace cystic changes, scattered MA; trace ERM, light focal laser changes, punctate exudates ST macula   Vessels attenuated, Tortuous attenuated, mild Tortuousity   Periphery Attached, scattered DBH greatest superiorly, 360 PRP, good laser fill in 360 attached, 360 PRP scars good posterior fill in -- room for fill in, inferior periphery obscured by persistent VH, scattered IRH        Refraction    Wearing Rx      Sphere Cylinder   Right -3.75 Sphere   Left -3.75 Sphere          IMAGING AND PROCEDURES  Imaging and Procedures for @TODAY @  OCT, Retina - OU - Both Eyes       Right Eye Quality was good. Central Foveal Thickness: 320. Progression has improved. Findings include  abnormal foveal contour, epiretinal membrane, no SRF, intraretinal hyper-reflective material (interval improvement in temporal IRF/DME).   Left Eye Quality was good. Central Foveal Thickness: 287. Progression has been stable. Findings include normal foveal contour, intraretinal fluid, no SRF, intraretinal hyper-reflective material (Persistent vitreous opacities; persistent cystic changes).   Notes *Images captured and stored on drive  Diagnosis / Impression:  DME OU OD: interval improvement in temporal IRF/DME OS: Persistent vitreous opacities; persistent cystic changes  Clinical management:  See below  Abbreviations: NFP - Normal foveal profile. CME - cystoid macular edema. PED - pigment epithelial detachment. IRF - intraretinal fluid. SRF - subretinal fluid. EZ - ellipsoid zone. ERM - epiretinal membrane. ORA - outer retinal atrophy. ORT - outer retinal tubulation. SRHM - subretinal hyper-reflective material         Intravitreal Injection, Pharmacologic Agent - OD - Right Eye       Time Out 02/17/2020. 3:21 PM. Confirmed correct patient, procedure, site, and patient consented.   Anesthesia Topical anesthesia was used. Anesthetic medications included Lidocaine 2%, Proparacaine 0.5%.   Procedure Preparation included 5% betadine to ocular surface, eyelid speculum. A (32g) needle was used.   Injection:  2 mg aflibercept Alfonse Flavors) SOLN   NDC: A3590391, Lot: [id number], Expiration date: 05/27/2020   Route: Intravitreal, Site: Right Eye, Waste: 0.05 mL  Post-op Post injection exam found visual acuity of at least counting fingers. The patient tolerated the procedure well. There were no complications. The patient received written and verbal post procedure care education. Post injection medications were not given.        Intravitreal Injection, Pharmacologic Agent - OS - Left Eye       Time Out 02/17/2020. 3:21 PM. Confirmed correct patient, procedure, site, and patient  consented.   Anesthesia Topical anesthesia was used. Anesthetic medications included Lidocaine 2%, Proparacaine 0.5%.   Procedure Preparation included 5% betadine to ocular surface, eyelid speculum. A (32g) needle was used.   Injection:  2 mg aflibercept Alfonse Flavors) SOLN   NDC: M7179715, Lot: [id number], Expiration date: 08/26/2020   Route: Intravitreal, Site: Left Eye, Waste: 0.05 mL  Post-op Post injection exam found visual acuity of at least counting fingers. The patient tolerated the procedure well. There were no complications. The patient received written and verbal post procedure care education. Post injection medications were not given.                ASSESSMENT/PLAN:    ICD-10-CM   1. Proliferative diabetic retinopathy of both eyes with macular edema associated with type 2 diabetes mellitus (HCC)  H60.7371 Intravitreal Injection, Pharmacologic Agent - OD - Right Eye    Intravitreal Injection, Pharmacologic Agent - OS - Left Eye    aflibercept (EYLEA) SOLN 2 mg    aflibercept (EYLEA) SOLN 2 mg  2. Retinal edema  H35.81 OCT, Retina - OU - Both Eyes  3. Vitreous hemorrhage of left eye (HCC)  H43.12 Intravitreal Injection, Pharmacologic Agent - OS - Left Eye    aflibercept (EYLEA) SOLN 2 mg  4. Vitreous hemorrhage, right eye ([place])  H43.11   5. Right endophthalmia  H44.001   6. Essential hypertension  I10   7. Hypertensive retinopathy of both eyes  H35.033   8. Combined forms of age-related cataract of left eye  H25.812   9.  Pseudophakia of right eye  Z96.1    1,2.  Proliferative diabetic retinopathy w/ DME, OU  - HbA1c 11.0% (11.22.21), 8.3% (03.31.21, 12.2.20), 8.6% (06.30.20), 8.4% (02.14.20)  - s/p IVA OD #1 9.20.19, #2 (10.25.19), #3 (11.15.19), #4 (12.17.19), #5 (01.14.20), #6 (2.11.20), #7 (05.29.20), #8 (08.19.20), #9 (10.30.20), #10 (12.09.20), #11 (01.11.21), #12 (02.15.21), #13 (03.23.21), #14 (04.20.21), #15 (06.08.21), #16 (07.06.21)  - s/p IVA OS #1 9.27.19,  #2 (10.25.19), #3 (11.15.19), #4 (12.17.19), #5 (01.14.20), #6 (2.11.20), #7 (04.26.20), #8 (05.29.20), #9 (06.26.20), #10 (08.05.20), #11 (11.11.20), #12 (12.09.20), #13 (01.11.21), #14 (02.15.21), #15 (03.23.21), #16 (04.20.21), #17 (06.08.21) -- IVA resistance, #18 (09.27.21)  - s/p IVE OD #1 (08.03.21) -- sample, #2 (09.10.21), #3 (10.08.21), #4 (11.23.21)  - s/p IVE OS #1 (10.25.21), #2 (11.23.21)  - S/P PRP OS (09.20.19), (5.19.20), (08.19.20), (04.28.21)  - S/P PRP OD (9.27.19 and 11.21.19), fill-in (04.14.20) (09.03.20, surgery)  - S/P focal laser OS (07.06.21)  - FA (9.20.19) shows +NVE OU and leaking MA and capillary nonperfusion  - repeat FA 11.15.19 shows NV regressing OU  - pre-op: OD w/ VA stable at 20/25, but there is some preretinal fibrosis / tractional membranes just superior to disc and mild central DME  - s/p 25g PPV+MP+10% C3F8 gas OD (09.03.20) -- ERM/PRF removal OD  - BCVA stable at 20/30 OD              - fibrosis/ERM stably improved; retina attached  - OCT shows OD: interval improvement in temoral IRF/DME; OS: persistent vitreous opacities and persistent cystic changes  - recommend IVE OD #5, OS #3 (12.21.21) for PDR w/ DME and VH  - pt wishes to proceed with injection  - RBA of procedure discussed, questions answered  - informed consent obtained and signed  - see procedure note  - Avastin informed consent form signed and scanned on 01.11.2021 (OU)  - Eylea informed consent form signed and scanned on 08.03.2021  - [email or web address] benefits investigation started, 08.03.21 -- approved as of 09.10.21  - f/u 4 weeks, DFE, OCT, possible injection(s) +/- PRP laser OS  3. Vitreous hemorrhage OS -- recurrent  - recurrent VH, onset 09.22.21  - etiology: secondary to PDR as described above (no RT/RD on exam)  - s/p PRP OS (9.20.19), (05.19.20), (08.19.20), (04.28.21)  - s/p IVA OS on 4.26.20, 5.29.20, 6.26.20, 8.5.20,11.11.20, 12.06.20 and so on as above  - VH persistent, but  mild  - BCVA stable at 20/25-2  - recommend IVE OS #3 for PDR w/ DME and VH, as above  - f/u 4 weeks, DFE, OCT, possible injection(s) +/- PRP laser OS  4. History of Endophthalmitis OD  - s/p IVA OU 04/09/2018  - s/p 25g PPV w/ intravitreal vanc, ceftaz and cefepime OD, 2.14.2020  - s/p intravitreal tap / vanc and ceflaz injections (02.16.20)             - gram stain (2.14.20) shows G+ cocci, WBCs mostly PMNs;   - repeat gram stain from t/i (2.16.20) -- no organisms, just WBCs             - cultures from vitreous grew rare Staph warneri; cultures from t/i -- no growth             - doing well, BCVA 20/30             - inflammation/posterior debris resolved             - IOP 13 off Brimonidine  -  completed po pred taper -- caused significant elevations in BG  - monitor  5. History of Vitreous Hemorrhage OD -- cleared from PPV x2 for endophthalmitis and ERM/preretinal fibrosis  - secondary to PDR as described below  - S/P IVA OD #1 (09.20.19), #2 (10.25.19), #3 (11.15.19), #4 (12.16.19), #5 (03/12/2018)  - S/P PRP OD #1 (09.27.19), fill in OD (11.21.19) -- each somewhat limited inferiorly by residual VH  6,7. Hypertensive retinopathy OU  - BP elevated today (9.27.21) -- 164/91, left arm  - discussed importance of tight BP control.  - monitor  8. Combined form age related cataract OS-   - The symptoms of cataract, surgical options, and treatments and risks were discussed with patient.  - discussed diagnosis and progression  9. Pseudophakia OD  - s/p CE/IOL OD (Dr. Zenia Resides, 12.11.20)  - beautiful surgery, doing well  Ophthalmic Meds Ordered this visit:  Meds ordered this encounter  Medications  . aflibercept (EYLEA) SOLN 2 mg  . aflibercept (EYLEA) SOLN 2 mg      Return in about 4 weeks (around 03/16/2020) for f/u PDR OU, DFE, OCT.  There are no Patient Instructions on file for this visit.  This document serves as a record of services personally performed by Gardiner Sleeper, MD, PhD. It was created on their behalf by San Jetty. Owens Shark, OA an ophthalmic technician. The creation of this record is the provider's dictation and/or activities during the visit.    Electronically signed by: San Jetty. Marguerita Merles 12.21.2021 9:41 PM  Gardiner Sleeper, M.D., Ph.D. Diseases & Surgery of the Retina and [place] 02/17/2020   I have reviewed the above documentation for accuracy and completeness, and I agree with the above. Gardiner Sleeper, M.D., Ph.D. 02/17/20 9:41 PM   Abbreviations: M myopia (nearsighted); A astigmatism; H hyperopia (farsighted); P presbyopia; Mrx spectacle prescription;  CTL contact lenses; OD right eye; OS left eye; OU both eyes  XT exotropia; ET esotropia; PEK punctate epithelial keratitis; PEE punctate epithelial erosions; DES dry eye syndrome; MGD meibomian gland dysfunction; ATs artificial tears; PFAT's preservative free artificial tears; [place] nuclear sclerotic cataract; PSC posterior subcapsular cataract; ERM epi-retinal membrane; PVD posterior vitreous detachment; RD retinal detachment; DM diabetes mellitus; DR diabetic retinopathy; NPDR non-proliferative diabetic retinopathy; PDR proliferative diabetic retinopathy; CSME clinically significant macular edema; DME diabetic macular edema; dbh dot blot hemorrhages; CWS cotton wool spot; POAG primary open angle glaucoma; C/D cup-to-disc ratio; HVF humphrey visual field; GVF goldmann visual field; OCT optical coherence tomography; IOP intraocular pressure; BRVO Branch retinal vein occlusion; CRVO central retinal vein occlusion; CRAO central retinal artery occlusion; BRAO branch retinal artery occlusion; RT retinal tear; SB scleral buckle; PPV pars plana vitrectomy; VH Vitreous hemorrhage; PRP panretinal laser photocoagulation; IVK intravitreal kenalog; VMT vitreomacular traction; MH Macular hole;  NVD neovascularization of the disc; NVE neovascularization elsewhere; AREDS age  related eye disease study; ARMD age related macular degeneration; POAG primary open angle glaucoma; EBMD epithelial/anterior basement membrane dystrophy; ACIOL anterior chamber intraocular lens; IOL intraocular lens; PCIOL posterior chamber intraocular lens; Phaco/IOL phacoemulsification with intraocular lens placement; [place] photorefractive keratectomy; LASIK laser assisted in situ keratomileusis; HTN hypertension; DM diabetes mellitus; COPD chronic obstructive pulmonary disease

## 2020-02-16 ENCOUNTER — Ambulatory Visit: Payer: BLUE CROSS/BLUE SHIELD | Admitting: Family Medicine

## 2020-02-16 NOTE — Progress Notes (Deleted)
OFFICE VISIT  02/16/2020  CC: No chief complaint on file.   HPI:    Patient is a 49 y.o. Caucasian female who presents for 1 month f/u gastroparesis symptoms in the setting of poorly controlled DM.  Also f/u uncontrolled HTN and is due for FLP to monitor HLD.  A/P as of last visit: "1) DM 2: poor control long term, not currently on insulin. Has recurrence of symptomatic diab gastroparesis lately. Restart reglan 5mg  qid. Try to get better gluc control->start lantus 10 U qhs and titrate slowly to goal fasting gluc of 100-110 range. Hba1c, lytes/cr, and urine microalb/cr today. Feet exam normal today.  2) HTN: not well controlled. Increase metoprolol to 1 and 1/2 tabs once a day. If tolerating this dose x 3-4 days and bp not to goal of <130 over<80 then increase to two whole tabs every day. Cont losartan 100mg  qd. BMET today.  3) HLD: tolerating rosuva 10mg  qd. LDL 39 Dec 2020. Not fasting today.  Needs rpt FLP next f/u visit.  Hepatic panel today.  4) IDA (hx of), due to menorrhagia. Tolerating 325mg  feSo4 qd. CBC and iron panel today."  INTERIM HX: ***  Past Medical History:  Diagnosis Date  . Abdominal bloating    likely from diab gastroparesis.  Dr. Havery Moros started trial of reglan 03/2019.  Marland Kitchen Benign brain tumor ([place])    Cystic lesion in cerebral aqueduct region with mild hydrocephalus-- stable MRI 02/2016.  Surveillance MRI 05/2017 --dilated cerebral aqueduct related to aqueductal stenosis and subsequent mild hydrocephalus (due to the 11 mm stable cystic lesion in cerebral aqueduct---?congenitial?.  . Cataract    OU  . Dysmenorrhea    vicodin occ during first 2 days of cycle.  . Gluten intolerance    pt reports she underwent full GI w/u to r/o celiac dz  . Hepatic steatosis    ultrasound 08/2017. Hx of very mild elevation of ALT  . History of adenomatous polyp of colon 04/08/2019   recall Feb 2024  . Hyperlipidemia, mixed   . Hypertensive retinopathy of both  eyes   . Insomnia   . Iron deficiency 01/2019   Hb 11.3. Hemoccults neg x 3 03/19/19. EGD and colonoscopy 04/08/19 showed NO cause for IDA.  Pt does have menorrhagia, though, so she'll see her GYN.  started FeSO4 325 qd approx 04/14/19.  . Menorrhagia    resulting in [place] 2021  . Proliferative diabetic retinopathy of both eyes ([place])    steroid injections 10/2017--improved  . Sensorineural hearing loss of left ear    Sudden left hearing loss summer 2016--no improvement with steroids 01/2015 so brain MRI done by Dr. Redmond Baseman and it showed brain tumor that was determined to be benign.  Pt's hearing not bad enough for hearing aid as of 06/2016.  . Type 2 diabetes with complication (HCC)    +microalbuminuria, diab retpthy, diabetic gastroparesis (gastric emptying study mildly abnl 03/2017).  Recommended lantus 08/2018 but pt declined. Mild microalbuminuria.  Marland Kitchen Uterine fibroid   . White coat hypertension     Past Surgical History:  Procedure Laterality Date  . ANOSCOPY  05/12/2019   Procedure: normal exam, minimal hemorrhoid disease. Hyertrophied anal papila, benign appearing, posterior midline. Surgeon: Leighton Ruff MD  . CHOLECYSTECTOMY  2000  . COLONOSCOPY  04/08/2019   5 adenomas, recall 3 yrs; no cause for IDA found.  Hypertrophied anal papillae->bx showed low grade dysplasia; GI referred her to colorectal surgeon.  . ESOPHAGOGASTRODUODENOSCOPY  04/08/2019   mild chronic reactive  gastritis. H pylori NEG.  No cause for IDA found.  . GAS INSERTION Right 10/31/2018   Procedure: Insertion Of C3F8 Gas;  Surgeon: Bernarda Caffey, MD;  Location: [facility];  Service: Ophthalmology;  Laterality: Right;  . GASTRIC EMPTYING SCAN  04/20/2017   Mildly abnormal, particularly the 1st hour of emptying.  Marland Kitchen MEMBRANE PEEL Right 10/31/2018   Procedure: MEMBRANE PEEL;  Surgeon: Bernarda Caffey, MD;  Location: [facility];  Service: Ophthalmology;  Laterality: Right;  . PARS PLANA VITRECTOMY Right 04/12/2018   Procedure: Right PARS  PLANA VITRECTOMY WITH 25 GAUGE with intravitreal antibiotics;  Surgeon: Bernarda Caffey, MD;  Location: [facility];  Service: Ophthalmology;  Laterality: Right;  . PARS PLANA VITRECTOMY Right 10/31/2018   Procedure: PARS PLANA VITRECTOMY WITH 25 GAUGE;  Surgeon: Bernarda Caffey, MD;  Location: [facility];  Service: Ophthalmology;  Laterality: Right;  . PHOTOCOAGULATION WITH LASER Right 10/31/2018   Procedure: Photocoagulation With Laser;  Surgeon: Bernarda Caffey, MD;  Location: [facility];  Service: Ophthalmology;  Laterality: Right;    Outpatient Medications Prior to Visit  Medication Sig Dispense Refill  . HYDROcodone-acetaminophen (NORCO/VICODIN) 5-325 MG tablet Take 1-2 tablets by mouth every 6 (six) hours as needed for moderate pain. 40 tablet 0  . insulin glargine (LANTUS SOLOSTAR) 100 UNIT/ML Solostar Pen 10 Units SQ qhs, patient will be gradually titrating this dose to get to target fasting glucose range of 100-110 15 mL 6  . JANUMET 50-1000 MG tablet TAKE 1 TABLET BY MOUTH TWICE DAILY WITH A MEAL 180 tablet 0  . losartan (COZAAR) 100 MG tablet Take 1 tablet (100 mg total) by mouth daily. 90 tablet 0  . metoCLOPramide (REGLAN) 5 MG tablet Take 1 tablet (5 mg total) by mouth 4 (four) times daily -  before meals and at bedtime. 120 tablet 1  . metoprolol succinate (TOPROL-XL) 100 MG 24 hr tablet 2 tabs po qd 60 tablet 6  . Multiple Vitamin (MULTIVITAMIN WITH MINERALS) TABS tablet Take 1 tablet by mouth daily.    Marland Kitchen OVER THE COUNTER MEDICATION Take 2 capsules by mouth daily. Neu Remedy     . pantoprazole (PROTONIX) 40 MG tablet Take 1 tablet by mouth once daily 90 tablet 0  . promethazine (PHENERGAN) 12.5 MG tablet 1-2 tabs po q6h prn nausea 30 tablet 1  . rosuvastatin (CRESTOR) 10 MG tablet Take 1 tablet (10 mg total) by mouth daily. (Patient taking differently: Take 10 mg by mouth every other day. ) 30 tablet 4  . VICTOZA 18 MG/3ML SOPN INJECT 0.6MG  INTO THE SKIN DAILY FOR 1 WEEK, THEN INCREASE TO INJECT 1.2MG   DAILY 6 mL 0   No facility-administered medications prior to visit.    Allergies  Allergen Reactions  . Gluten Meal Swelling  . Lisinopril Cough  . Pioglitazone Other (See Comments)    ELEVATED glucoses + worse chronic nausea    ROS As per HPI  PE: Vitals with BMI 01/19/2020 11/24/2019 09/22/2019  Height 5\' 3"  - -  Weight 169 lbs - 167 lbs  BMI 65.68 - -  Systolic 127 517 001  Diastolic 84 91 84  Pulse 94 93 -     ***  LABS:  Lab Results  Component Value Date   TSH 1.37 06/23/2019   Lab Results  Component Value Date   WBC 7.0 01/19/2020   HGB 12.3 01/19/2020   HCT 37.4 01/19/2020   MCV 86.6 01/19/2020   PLT 429.0 (H) 01/19/2020   Lab Results  Component Value  Date   IRON 54 01/19/2020   TIBC 383 01/19/2020   FERRITIN 52 01/19/2020   Lab Results  Component Value Date   VITAMINB12 967 01/29/2019    Lab Results  Component Value Date   CREATININE 0.53 01/19/2020   BUN 13 01/19/2020   NA 137 01/19/2020   K 4.4 01/19/2020   CL 100 01/19/2020   CO2 28 01/19/2020   Lab Results  Component Value Date   ALT 47 (H) 01/19/2020   AST 39 (H) 01/19/2020   ALKPHOS 75 01/19/2020   BILITOT 0.3 01/19/2020   Lab Results  Component Value Date   CHOL 99 01/29/2019   Lab Results  Component Value Date   HDL 33 (L) 01/29/2019   Lab Results  Component Value Date   LDLCALC 39 01/29/2019   Lab Results  Component Value Date   TRIG 196 (H) 01/29/2019   Lab Results  Component Value Date   CHOLHDL 3.0 01/29/2019   Lab Results  Component Value Date   HGBA1C 11.0 (H) 01/19/2020   IMPRESSION AND PLAN:  No problem-specific Assessment & Plan notes found for this encounter.   An After Visit Summary was printed and given to the patient.  FOLLOW UP: No follow-ups on file.  Signed:  Crissie Sickles, MD           02/16/2020

## 2020-02-17 ENCOUNTER — Other Ambulatory Visit: Payer: Self-pay

## 2020-02-17 ENCOUNTER — Encounter (INDEPENDENT_AMBULATORY_CARE_PROVIDER_SITE_OTHER): Payer: Self-pay | Admitting: Ophthalmology

## 2020-02-17 ENCOUNTER — Ambulatory Visit (INDEPENDENT_AMBULATORY_CARE_PROVIDER_SITE_OTHER): Payer: BLUE CROSS/BLUE SHIELD | Admitting: Ophthalmology

## 2020-02-17 DIAGNOSIS — H4312 Vitreous hemorrhage, left eye: Secondary | ICD-10-CM | POA: Diagnosis not present

## 2020-02-17 DIAGNOSIS — H25812 Combined forms of age-related cataract, left eye: Secondary | ICD-10-CM

## 2020-02-17 DIAGNOSIS — H3581 Retinal edema: Secondary | ICD-10-CM

## 2020-02-17 DIAGNOSIS — I10 Essential (primary) hypertension: Secondary | ICD-10-CM

## 2020-02-17 DIAGNOSIS — H4311 Vitreous hemorrhage, right eye: Secondary | ICD-10-CM

## 2020-02-17 DIAGNOSIS — H35033 Hypertensive retinopathy, bilateral: Secondary | ICD-10-CM

## 2020-02-17 DIAGNOSIS — E113513 Type 2 diabetes mellitus with proliferative diabetic retinopathy with macular edema, bilateral: Secondary | ICD-10-CM | POA: Diagnosis not present

## 2020-02-17 DIAGNOSIS — H44001 Unspecified purulent endophthalmitis, right eye: Secondary | ICD-10-CM

## 2020-02-17 DIAGNOSIS — Z961 Presence of intraocular lens: Secondary | ICD-10-CM

## 2020-02-17 MED ORDER — AFLIBERCEPT 2MG/0.05ML IZ SOLN FOR KALEIDOSCOPE
2.0000 mg | INTRAVITREAL | Status: AC | PRN
  Administered 2020-02-17: 2 mg via INTRAVITREAL

## 2020-02-26 ENCOUNTER — Other Ambulatory Visit: Payer: Self-pay

## 2020-02-26 ENCOUNTER — Ambulatory Visit: Payer: BLUE CROSS/BLUE SHIELD | Admitting: Family Medicine

## 2020-02-26 ENCOUNTER — Encounter: Payer: Self-pay | Admitting: Family Medicine

## 2020-02-26 VITALS — BP 148/85 | HR 93 | Temp 97.6°F | Resp 16 | Ht 63.0 in | Wt 173.6 lb

## 2020-02-26 DIAGNOSIS — I10 Essential (primary) hypertension: Secondary | ICD-10-CM

## 2020-02-26 DIAGNOSIS — E1143 Type 2 diabetes mellitus with diabetic autonomic (poly)neuropathy: Secondary | ICD-10-CM | POA: Diagnosis not present

## 2020-02-26 MED ORDER — LANTUS SOLOSTAR 100 UNIT/ML ~~LOC~~ SOPN
PEN_INJECTOR | SUBCUTANEOUS | 6 refills | Status: DC
Start: 2020-02-26 — End: 2020-05-27

## 2020-02-26 MED ORDER — AMLODIPINE BESYLATE 5 MG PO TABS: 5.0000 mg | ORAL_TABLET | Freq: Every day | ORAL | 2 refills | Status: DC

## 2020-02-26 NOTE — Progress Notes (Signed)
OFFICE VISIT  02/26/2020  CC:  Chief Complaint  Patient presents with  . Follow-up    RCI, pt is not fasting    HPI:    Patient is a 49 y.o. Caucasian female who presents for 5 week f/u poorly controlled DM with recent re-emergence of diabetic gastroparesis + f/u uncontrolled HTN. A/P as of last visit: "1) DM 2: poor control long term, not currently on insulin. Has recurrence of symptomatic diab gastroparesis lately. Restart reglan 5mg  qid. Try to get better gluc control->start lantus 10 U qhs and titrate slowly to goal fasting gluc of 100-110 range. Hba1c, lytes/cr, and urine microalb/cr today. Feet exam normal today.  2) HTN: not well controlled. Increase metoprolol to 1 and 1/2 tabs once a day. If tolerating this dose x 3-4 days and bp not to goal of <130 over<80 then increase to two whole tabs every day. Cont losartan 100mg  qd. BMET today.  3) HLD: tolerating rosuva 10mg  qd. LDL 39 Dec 2020. Not fasting today.  Needs rpt FLP next f/u visit.  Hepatic panel today.  4) IDA (hx of), due to menorrhagia. Tolerating 325mg  feSo4 qd. CBC and iron panel today."  INTERIM HX: Has titrated lantus up to 31 units, fastings now into 114-170 fasting range. Occ hs check of glucose have been around 215.  Has increased the toprol up to 2 full 100 mg tabs every day. Home bp's 130s over mid 80s now.  Says nausea is much improved, much less vomiting. Still with bloating/heaviness in stomach.   Her worse sx's are in postprandial time period.   Past Medical History:  Diagnosis Date  . Abdominal bloating    likely from diab gastroparesis.  Dr. started trial of reglan 03/2019.  02-17-2004 Benign brain tumor ([facility])    Cystic lesion in cerebral aqueduct region with mild hydrocephalus-- stable MRI 02/2016.  Surveillance MRI 05/2017 --dilated cerebral aqueduct related to aqueductal stenosis and subsequent mild hydrocephalus (due to the 11 mm stable cystic lesion in cerebral  aqueduct---?congenitial?.  . Cataract    OU  . Dysmenorrhea    vicodin occ during first 2 days of cycle.  . Gluten intolerance    pt reports she underwent full GI w/u to r/o celiac dz  . Hepatic steatosis    ultrasound 08/2017. Hx of very mild elevation of ALT  . History of adenomatous polyp of colon 04/08/2019   recall Feb 2024  . Hyperlipidemia, mixed   . Hypertensive retinopathy of both eyes   . Insomnia   . Iron deficiency 01/2019   Hb 11.3. Hemoccults neg x 3 03/19/19. EGD and colonoscopy 04/08/19 showed NO cause for IDA.  Pt does have menorrhagia, though, so she'll see her GYN.  started FeSO4 325 qd approx 04/14/19.  . Menorrhagia    resulting in I[place] 2021  . Proliferative diabetic retinopathy of both eyes ([facility])    steroid injections 10/2017--improved  . Sensorineural hearing loss of left ear    Sudden left hearing loss summer 2016--no improvement with steroids 01/2015 so brain MRI done by Dr. 04/16/19 and it showed brain tumor that was determined to be benign.  Pt's hearing not bad enough for hearing aid as of 06/2016.  . Type 2 diabetes with complication (HCC)    +microalbuminuria, diab retpthy, diabetic gastroparesis (gastric emptying study mildly abnl 03/2017).  Recommended lantus 08/2018 but pt declined. Mild microalbuminuria.  Jenne Pane Uterine fibroid   . White coat hypertension     Past Surgical History:  Procedure Laterality  Date  . ANOSCOPY  05/12/2019   Procedure: normal exam, minimal hemorrhoid disease. Hyertrophied anal papila, benign appearing, posterior midline. Surgeon: Leighton Ruff MD  . CHOLECYSTECTOMY  2000  . COLONOSCOPY  04/08/2019   5 adenomas, recall 3 yrs; no cause for IDA found.  Hypertrophied anal papillae->bx showed low grade dysplasia; GI referred her to colorectal surgeon.  . ESOPHAGOGASTRODUODENOSCOPY  04/08/2019   mild chronic reactive gastritis. H pylori NEG.  No cause for IDA found.  . GAS INSERTION Right 10/31/2018   Procedure: Insertion Of C3F8 Gas;  Surgeon:  Bernarda Caffey, MD;  Location: [facility];  Service: Ophthalmology;  Laterality: Right;  . GASTRIC EMPTYING SCAN  04/20/2017   Mildly abnormal, particularly the 1st hour of emptying.  Marland Kitchen MEMBRANE PEEL Right 10/31/2018   Procedure: MEMBRANE PEEL;  Surgeon: Bernarda Caffey, MD;  Location: [facility];  Service: Ophthalmology;  Laterality: Right;  . PARS PLANA VITRECTOMY Right 04/12/2018   Procedure: Right PARS PLANA VITRECTOMY WITH 25 GAUGE with intravitreal antibiotics;  Surgeon: Bernarda Caffey, MD;  Location: [facility];  Service: Ophthalmology;  Laterality: Right;  . PARS PLANA VITRECTOMY Right 10/31/2018   Procedure: PARS PLANA VITRECTOMY WITH 25 GAUGE;  Surgeon: Bernarda Caffey, MD;  Location: [facility];  Service: Ophthalmology;  Laterality: Right;  . PHOTOCOAGULATION WITH LASER Right 10/31/2018   Procedure: Photocoagulation With Laser;  Surgeon: Bernarda Caffey, MD;  Location: [facility];  Service: Ophthalmology;  Laterality: Right;    Outpatient Medications Prior to Visit  Medication Sig Dispense Refill  . HYDROcodone-acetaminophen (NORCO/VICODIN) 5-325 MG tablet Take 1-2 tablets by mouth every 6 (six) hours as needed for moderate pain. 40 tablet 0  . JANUMET 50-1000 MG tablet TAKE 1 TABLET BY MOUTH TWICE DAILY WITH A MEAL 180 tablet 0  . losartan (COZAAR) 100 MG tablet Take 1 tablet (100 mg total) by mouth daily. 90 tablet 0  . metoCLOPramide (REGLAN) 5 MG tablet Take 1 tablet (5 mg total) by mouth 4 (four) times daily -  before meals and at bedtime. 120 tablet 1  . metoprolol succinate (TOPROL-XL) 100 MG 24 hr tablet 2 tabs po qd 60 tablet 6  . Multiple Vitamin (MULTIVITAMIN WITH MINERALS) TABS tablet Take 1 tablet by mouth daily.    Marland Kitchen OVER THE COUNTER MEDICATION Take 2 capsules by mouth daily. Neu Remedy    . pantoprazole (PROTONIX) 40 MG tablet Take 1 tablet by mouth once daily 90 tablet 0  . promethazine (PHENERGAN) 12.5 MG tablet 1-2 tabs po q6h prn nausea 30 tablet 1  . rosuvastatin (CRESTOR) 10 MG tablet Take 1 tablet  (10 mg total) by mouth daily. (Patient taking differently: Take 10 mg by mouth every other day.) 30 tablet 4  . VICTOZA 18 MG/3ML SOPN INJECT 0.6MG  INTO THE SKIN DAILY FOR 1 WEEK, THEN INCREASE TO INJECT 1.2MG  DAILY 6 mL 0  . insulin glargine (LANTUS SOLOSTAR) 100 UNIT/ML Solostar Pen 10 Units SQ qhs, patient will be gradually titrating this dose to get to target fasting glucose range of 100-110 15 mL 6   No facility-administered medications prior to visit.    Allergies  Allergen Reactions  . Gluten Meal Swelling  . Lisinopril Cough  . Pioglitazone Other (See Comments)    ELEVATED glucoses + worse chronic nausea    ROS As per HPI  PE: Vitals with BMI 02/26/2020 01/19/2020 11/24/2019  Height 5\' 3"  5\' 3"  -  Weight 173 lbs 10 oz 169 lbs -  BMI 99991111 99991111 -  Systolic 123456  [id number] 123456  Diastolic 85 84 91  Pulse 93 94 93    Gen: Alert, well appearing.  Patient is oriented to person, place, time, and situation. AFFECT: pleasant, lucid thought and speech. No further exam today.  LABS:  Lab Results  Component Value Date   TSH 1.37 06/23/2019   Lab Results  Component Value Date   WBC 7.0 01/19/2020   HGB 12.3 01/19/2020   HCT 37.4 01/19/2020   MCV 86.6 01/19/2020   PLT 429.0 (H) 01/19/2020   Lab Results  Component Value Date   IRON 54 01/19/2020   TIBC 383 01/19/2020   FERRITIN 52 01/19/2020    Lab Results  Component Value Date   CREATININE 0.53 01/19/2020   BUN 13 01/19/2020   NA 137 01/19/2020   K 4.4 01/19/2020   CL 100 01/19/2020   CO2 28 01/19/2020   Lab Results  Component Value Date   ALT 47 (H) 01/19/2020   AST 39 (H) 01/19/2020   ALKPHOS 75 01/19/2020   BILITOT 0.3 01/19/2020   Lab Results  Component Value Date   CHOL 99 01/29/2019   Lab Results  Component Value Date   HDL 33 (L) 01/29/2019   Lab Results  Component Value Date   LDLCALC 39 01/29/2019   Lab Results  Component Value Date   TRIG 196 (H) 01/29/2019   Lab Results  Component  Value Date   CHOLHDL 3.0 01/29/2019   Lab Results  Component Value Date   HGBA1C 11.0 (H) 01/19/2020   IMPRESSION AND PLAN:  1) DM, poor control, with diabetic gastroparesis "flare": She is improving overall on reglan qid and with getting on lantus and titrating this up gradually. Continue with this plan. Next a1c in 2 mo.  2) HTN, better control. Will add amlodipine 5mg  qd. Cont toprol xl 200 mg qd and losartan 100 mg qd.  3) HLD: tolerating rosuva 10mg  every other day. Hopefully she'll come fasting next f/u visit in 2 mo and I'll then be able to recheck lipid panel.  An After Visit Summary was printed and given to the patient.  FOLLOW UP: Return in about 2 months (around 04/26/2020) for routine chronic illness f/u. Next cpe 6 mo  Signed:  Crissie Sickles, MD           02/26/2020

## 2020-02-28 DIAGNOSIS — D259 Leiomyoma of uterus, unspecified: Secondary | ICD-10-CM

## 2020-02-28 DIAGNOSIS — M353 Polymyalgia rheumatica: Secondary | ICD-10-CM

## 2020-02-28 HISTORY — DX: Polymyalgia rheumatica: M35.3

## 2020-02-28 HISTORY — DX: Leiomyoma of uterus, unspecified: D25.9

## 2020-03-12 ENCOUNTER — Other Ambulatory Visit: Payer: Self-pay | Admitting: Family Medicine

## 2020-03-12 ENCOUNTER — Telehealth: Payer: Self-pay | Admitting: Family Medicine

## 2020-03-12 DIAGNOSIS — R609 Edema, unspecified: Secondary | ICD-10-CM

## 2020-03-12 MED ORDER — FUROSEMIDE 20 MG PO TABS: 20.0000 mg | ORAL_TABLET | Freq: Two times a day (BID) | ORAL | 1 refills | Status: DC

## 2020-03-12 NOTE — Telephone Encounter (Signed)
Patient called to report update on unexplained weight gain since 02/26/20 appt. She states she has gained another 9lb since that appointment. She feels like it is fluid and states she "looks like I'm pregnant." Also says it makes her short of breath at night due to pressure. She wonders if she should be on a water pill or referred back to GI. Please call patient to advise.

## 2020-03-12 NOTE — Telephone Encounter (Signed)
Left patient detailed message per DPR  

## 2020-03-12 NOTE — Telephone Encounter (Signed)
Please advise, thanks.

## 2020-03-12 NOTE — Telephone Encounter (Signed)
OK. The metaclopramide (reglan) she is on could cause fluid retention but I would have expected it to cause this before just the last 10d b/c she's been on it much longer than that.  Also, since it seems to be helping her gastroparesis some I don't really want her to stop it. I will start a daily dose of lasix fluid pill and have her get lab visit in 1 wk to check bmet, I'll enter order. Also, I do recommend she call and make appt with GI for recheck about her gastroparesis/abdominal bloating.  She has seen them before for this so she does not need a new referral.-thx

## 2020-03-15 NOTE — Telephone Encounter (Signed)
Left detailed message for patient to do follow up call notifying us she received message or send mychart message.

## 2020-03-16 ENCOUNTER — Encounter (INDEPENDENT_AMBULATORY_CARE_PROVIDER_SITE_OTHER): Payer: BLUE CROSS/BLUE SHIELD | Admitting: Ophthalmology

## 2020-03-16 ENCOUNTER — Other Ambulatory Visit: Payer: Self-pay

## 2020-03-16 DIAGNOSIS — E118 Type 2 diabetes mellitus with unspecified complications: Secondary | ICD-10-CM

## 2020-03-16 DIAGNOSIS — E1143 Type 2 diabetes mellitus with diabetic autonomic (poly)neuropathy: Secondary | ICD-10-CM

## 2020-03-16 MED ORDER — VICTOZA 18 MG/3ML ~~LOC~~ SOPN: PEN_INJECTOR | SUBCUTANEOUS | 0 refills | Status: DC

## 2020-03-16 NOTE — Progress Notes (Shared)
[facility] Note  03/18/2020     CHIEF COMPLAINT Patient presents for No chief complaint on file.   HISTORY OF PRESENT ILLNESS: Gloria Gomez is a 50 y.o. female who presents to the clinic today for:   pt states vision is stable OU, she states she sees a "grey-ness" in her left eye, but not really floaters   Referring physician:   HISTORICAL INFORMATION:   Selected notes from the MEDICAL RECORD NUMBER Referred for DM exam   CURRENT MEDICATIONS: No current outpatient medications on file. (Ophthalmic Drugs)   No current facility-administered medications for this visit. (Ophthalmic Drugs)   Current Outpatient Medications (Other)  Medication Sig  . amLODipine (NORVASC) 5 MG tablet Take 1 tablet (5 mg total) by mouth daily.  . furosemide (LASIX) 20 MG tablet Take 1 tablet (20 mg total) by mouth 2 (two) times daily.  Marland Kitchen HYDROcodone-acetaminophen (NORCO/VICODIN) 5-325 MG tablet Take 1-2 tablets by mouth every 6 (six) hours as needed for moderate pain.  Marland Kitchen insulin glargine (LANTUS SOLOSTAR) 100 UNIT/ML Solostar Pen 40 Units SQ qhs, patient will be gradually titrating this dose to get to target fasting glucose range of 100-110  . JANUMET 50-1000 MG tablet TAKE 1 TABLET BY MOUTH TWICE DAILY WITH A MEAL  . liraglutide (VICTOZA) 18 MG/3ML SOPN INJECT 0.6MG  INTO THE SKIN DAILY FOR 1 WEEK, THEN INCREASE TO INJECT 1.2MG  DAILY  . losartan (COZAAR) 100 MG tablet Take 1 tablet (100 mg total) by mouth daily.  . metoCLOPramide (REGLAN) 5 MG tablet Take 1 tablet (5 mg total) by mouth 4 (four) times daily -  before meals and at bedtime.  . metoprolol succinate (TOPROL-XL) 100 MG 24 hr tablet 2 tabs po qd  . Multiple Vitamin (MULTIVITAMIN WITH MINERALS) TABS tablet Take 1 tablet by mouth daily.  Marland Kitchen OVER THE COUNTER MEDICATION Take 2 capsules by mouth daily. Neu Remedy  . pantoprazole (PROTONIX) 40 MG tablet Take 1 tablet by mouth once daily  . promethazine (PHENERGAN)  12.5 MG tablet 1-2 tabs po q6h prn nausea  . rosuvastatin (CRESTOR) 10 MG tablet Take 1 tablet (10 mg total) by mouth daily. (Patient taking differently: Take 10 mg by mouth every other day.)   No current facility-administered medications for this visit. (Other)      REVIEW OF SYSTEMS:    ALLERGIES Allergies  Allergen Reactions  . Gluten Meal Swelling  . Lisinopril Cough  . Pioglitazone Other (See Comments)    ELEVATED glucoses + worse chronic nausea    PAST MEDICAL HISTORY Past Medical History:  Diagnosis Date  . Abdominal bloating    likely from diab gastroparesis.  Dr. Havery Moros started trial of reglan 03/2019.  Marland Kitchen Benign brain tumor ([place])    Cystic lesion in cerebral aqueduct region with mild hydrocephalus-- stable MRI 02/2016.  Surveillance MRI 05/2017 --dilated cerebral aqueduct related to aqueductal stenosis and subsequent mild hydrocephalus (due to the 11 mm stable cystic lesion in cerebral aqueduct---?congenitial?.  . Cataract    OU  . Dysmenorrhea    vicodin occ during first 2 days of cycle.  . Gluten intolerance    pt reports she underwent full GI w/u to r/o celiac dz  . Hepatic steatosis    ultrasound 08/2017. Hx of very mild elevation of ALT  . History of adenomatous polyp of colon 04/08/2019   recall Feb 2024  . Hyperlipidemia, mixed   . Hypertensive retinopathy of both eyes   . Insomnia   .  Iron deficiency 01/2019   Hb 11.3. Hemoccults neg x 3 03/19/19. EGD and colonoscopy 04/08/19 showed NO cause for IDA.  Pt does have menorrhagia, though, so she'll see her GYN.  started FeSO4 325 qd approx 04/14/19.  . Menorrhagia    resulting in [place] 2021  . Proliferative diabetic retinopathy of both eyes ([place])    steroid injections 10/2017--improved  . Sensorineural hearing loss of left ear    Sudden left hearing loss summer 2016--no improvement with steroids 01/2015 so brain MRI done by Dr. Redmond Baseman and it showed brain tumor that was determined to be benign.  Pt's hearing not  bad enough for hearing aid as of 06/2016.  . Type 2 diabetes with complication (HCC)    +microalbuminuria, diab retpthy, diabetic gastroparesis (gastric emptying study mildly abnl 03/2017).  Recommended lantus 08/2018 but pt declined. Mild microalbuminuria.  Marland Kitchen Uterine fibroid   . White coat hypertension    Past Surgical History:  Procedure Laterality Date  . ANOSCOPY  05/12/2019   Procedure: normal exam, minimal hemorrhoid disease. Hyertrophied anal papila, benign appearing, posterior midline. Surgeon: Leighton Ruff MD  . CHOLECYSTECTOMY  2000  . COLONOSCOPY  04/08/2019   5 adenomas, recall 3 yrs; no cause for IDA found.  Hypertrophied anal papillae->bx showed low grade dysplasia; GI referred her to colorectal surgeon.  . ESOPHAGOGASTRODUODENOSCOPY  04/08/2019   mild chronic reactive gastritis. H pylori NEG.  No cause for IDA found.  . GAS INSERTION Right 10/31/2018   Procedure: Insertion Of C3F8 Gas;  Surgeon: Bernarda Caffey, MD;  Location: [place];  Service: Ophthalmology;  Laterality: Right;  . GASTRIC EMPTYING SCAN  04/20/2017   Mildly abnormal, particularly the 1st hour of emptying.  Marland Kitchen MEMBRANE PEEL Right 10/31/2018   Procedure: MEMBRANE PEEL;  Surgeon: Bernarda Caffey, MD;  Location: [place];  Service: Ophthalmology;  Laterality: Right;  . PARS PLANA VITRECTOMY Right 04/12/2018   Procedure: Right PARS PLANA VITRECTOMY WITH 25 GAUGE with intravitreal antibiotics;  Surgeon: Bernarda Caffey, MD;  Location: [place];  Service: Ophthalmology;  Laterality: Right;  . PARS PLANA VITRECTOMY Right 10/31/2018   Procedure: PARS PLANA VITRECTOMY WITH 25 GAUGE;  Surgeon: Bernarda Caffey, MD;  Location: [place];  Service: Ophthalmology;  Laterality: Right;  . PHOTOCOAGULATION WITH LASER Right 10/31/2018   Procedure: Photocoagulation With Laser;  Surgeon: Bernarda Caffey, MD;  Location: [place];  Service: Ophthalmology;  Laterality: Right;    FAMILY HISTORY Family History  Problem Relation Age of Onset  . Brain cancer Mother    . Diabetes Father   . Diabetes Maternal Grandmother   . Cataracts Maternal Grandmother   . Cervical cancer Paternal Grandmother   . Colon cancer Maternal Grandfather 47  . Amblyopia Neg Hx   . Blindness Neg Hx   . Glaucoma Neg Hx   . Macular degeneration Neg Hx   . Retinal detachment Neg Hx   . Strabismus Neg Hx   . Retinitis pigmentosa Neg Hx   . Esophageal cancer Neg Hx   . Stomach cancer Neg Hx   . Rectal cancer Neg Hx     SOCIAL HISTORY Social History   Tobacco Use  . Smoking status: Never Smoker  . Smokeless tobacco: Never Used  Vaping Use  . Vaping Use: Never used  Substance Use Topics  . Alcohol use: No  . Drug use: No         OPHTHALMIC EXAM:  Not recorded     IMAGING AND PROCEDURES  Imaging and Procedures for @  [id number]          ASSESSMENT/PLAN:    ICD-10-CM   1. Proliferative diabetic retinopathy of both eyes with macular edema associated with type 2 diabetes mellitus ([place])  [id number]   2. Retinal edema  H35.81   3. Vitreous hemorrhage of left eye (HCC)  H43.12   4. Right endophthalmia  H44.001   5. Vitreous hemorrhage, right eye ([place])  H43.11   6. Essential hypertension  I10   7. Hypertensive retinopathy of both eyes  H35.033   8. Combined forms of age-related cataract of left eye  H25.812   9. Pseudophakia of right eye  Z96.1    1,2.  Proliferative diabetic retinopathy w/ DME, OU  - HbA1c 11.0% (11.22.21), 8.3% (03.31.21, 12.2.20), 8.6% (06.30.20), 8.4% (02.14.20)  - s/p IVA OD #1 9.20.19, #2 (10.25.19), #3 (11.15.19), #4 (12.17.19), #5 (01.14.20), #6 (2.11.20), #7 (05.29.20), #8 (08.19.20), #9 (10.30.20), #10 (12.09.20), #11 (01.11.21), #12 (02.15.21), #13 (03.23.21), #14 (04.20.21), #15 (06.08.21), #16 (07.06.21)  - s/p IVA OS #1 9.27.19, #2 (10.25.19), #3 (11.15.19), #4 (12.17.19), #5 (01.14.20), #6 (2.11.20), #7 (04.26.20), #8 (05.29.20), #9 (06.26.20), #10 (08.05.20), #11 (11.11.20), #12 (12.09.20), #13 (01.11.21), #14 (02.15.21), #15  (03.23.21), #16 (04.20.21), #17 (06.08.21) -- IVA resistance, #18 (09.27.21)  - s/p IVE OD #1 (08.03.21) -- sample, #2 (09.10.21), #3 (10.08.21), #4 (11.23.21), #5 (12.21.21)  - s/p IVE OS #1 (10.25.21), #2 (11.23.21), #3 (12.21.21)  - S/P PRP OS (09.20.19), (5.19.20), (08.19.20), (04.28.21)  - S/P PRP OD (9.27.19 and 11.21.19), fill-in (04.14.20) (09.03.20, surgery)  - S/P focal laser OS (07.06.21)  - FA (9.20.19) shows +NVE OU and leaking MA and capillary nonperfusion  - repeat FA 11.15.19 shows NV regressing OU  - pre-op: OD w/ VA stable at 20/25, but there is some preretinal fibrosis / tractional membranes just superior to disc and mild central DME  - s/p 25g PPV+MP+10% C3F8 gas OD (09.03.20) -- ERM/PRF removal OD  - BCVA stable at 20/30 OD              - fibrosis/ERM stably improved; retina attached  - OCT shows OD: interval improvement in temoral IRF/DME; OS: persistent vitreous opacities and persistent cystic changes  - recommend IVE OD #6, OS #4 (01.20.22) for PDR w/ DME and VH  - pt wishes to proceed with injection  - RBA of procedure discussed, questions answered  - informed consent obtained and signed  - see procedure note  - [place] informed consent form signed and scanned on 01.11.2021 (OU)  - E[place] informed consent form signed and scanned on 08.03.2021  - [email or web address] benefits investigation started, 08.03.21 -- approved as of 09.10.21  - f/u 4 weeks, DFE, OCT, possible injection(s) +/- PRP laser OS  3. Vitreous hemorrhage OS -- recurrent  - recurrent VH, onset 09.22.21  - etiology: secondary to PDR as described above (no RT/RD on exam)  - s/p PRP OS (9.20.19), (05.19.20), (08.19.20), (04.28.21)  - s/p IVA OS on (4.26.20), (5.29.20), (6.26.20), (8.5.20), (11.11.20), (12.06.20) and so on as above  - VH persistent, but mild  - BCVA stable at 20/25-2  - recommend IVE OS #4 for PDR w/ DME and VH, as above  - f/u 4 weeks, DFE, OCT, possible injection(s) +/- PRP laser OS  4.  History of Endophthalmitis OD  - s/p IVA OU 04/09/2018  - s/p 25g PPV w/ intravitreal vanc, ceftaz and cefepime OD, 2.14.2020  - s/p intravitreal tap / vanc and ceflaz injections (02.16.20)             -  gram stain (2.14.20) shows G+ cocci, WBCs mostly PMNs;   - repeat gram stain from t/i (2.16.20) -- no organisms, just WBCs             - cultures from vitreous grew rare Staph warneri; cultures from t/i -- no growth             - doing well, BCVA 20/30             - inflammation/posterior debris resolved             - IOP 13 off Brimonidine  - completed po pred taper -- caused significant elevations in BG  - monitor  5. History of Vitreous Hemorrhage OD -- cleared from PPV x2 for endophthalmitis and ERM/preretinal fibrosis  - secondary to PDR as described below  - S/P IVA OD #1 (09.20.19), #2 (10.25.19), #3 (11.15.19), #4 (12.16.19), #5 (03/12/2018)  - S/P PRP OD #1 (09.27.19), fill in OD (11.21.19) -- each somewhat limited inferiorly by residual VH  6,7. Hypertensive retinopathy OU  - BP elevated today (9.27.21) -- 164/91, left arm  - discussed importance of tight BP control.  - monitor  8. Combined form age related cataract OS-   - The symptoms of cataract, surgical options, and treatments and risks were discussed with patient.  - discussed diagnosis and progression  9. Pseudophakia OD  - s/p CE/IOL OD (Dr. Zenia Resides, 12.11.20)  - beautiful surgery, doing well  Ophthalmic Meds Ordered this visit:  No orders of the defined types were placed in this encounter.     No follow-ups on file.  There are no Patient Instructions on file for this visit.  total hip arthroplasty  Gardiner Sleeper, M.D., Ph.D. Diseases & Surgery of the Retina and [place] 03/18/2020     Abbreviations: M myopia (nearsighted); A astigmatism; H hyperopia (farsighted); P presbyopia; Mrx spectacle prescription;  CTL contact lenses; OD right eye; OS left eye; OU both  eyes  XT exotropia; ET esotropia; PEK punctate epithelial keratitis; PEE punctate epithelial erosions; DES dry eye syndrome; MGD meibomian gland dysfunction; ATs artificial tears; PFAT's preservative free artificial tears; [place] nuclear sclerotic cataract; PSC posterior subcapsular cataract; ERM epi-retinal membrane; PVD posterior vitreous detachment; RD retinal detachment; DM diabetes mellitus; DR diabetic retinopathy; NPDR non-proliferative diabetic retinopathy; PDR proliferative diabetic retinopathy; CSME clinically significant macular edema; DME diabetic macular edema; dbh dot blot hemorrhages; CWS cotton wool spot; POAG primary open angle glaucoma; C/D cup-to-disc ratio; HVF humphrey visual field; GVF goldmann visual field; OCT optical coherence tomography; IOP intraocular pressure; BRVO Branch retinal vein occlusion; CRVO central retinal vein occlusion; CRAO central retinal artery occlusion; BRAO branch retinal artery occlusion; RT retinal tear; SB scleral buckle; PPV pars plana vitrectomy; VH Vitreous hemorrhage; PRP panretinal laser photocoagulation; IVK intravitreal kenalog; VMT vitreomacular traction; MH Macular hole;  NVD neovascularization of the disc; NVE neovascularization elsewhere; AREDS age related eye disease study; ARMD age related macular degeneration; POAG primary open angle glaucoma; EBMD epithelial/anterior basement membrane dystrophy; ACIOL anterior chamber intraocular lens; IOL intraocular lens; PCIOL posterior chamber intraocular lens; Phaco/IOL phacoemulsification with intraocular lens placement; [place] photorefractive keratectomy; LASIK laser assisted in situ keratomileusis; HTN hypertension; DM diabetes mellitus; COPD chronic obstructive pulmonary disease

## 2020-03-17 ENCOUNTER — Ambulatory Visit (INDEPENDENT_AMBULATORY_CARE_PROVIDER_SITE_OTHER): Payer: 59 | Admitting: Ophthalmology

## 2020-03-17 ENCOUNTER — Encounter (INDEPENDENT_AMBULATORY_CARE_PROVIDER_SITE_OTHER): Payer: Self-pay | Admitting: Ophthalmology

## 2020-03-17 ENCOUNTER — Other Ambulatory Visit: Payer: Self-pay

## 2020-03-17 DIAGNOSIS — H4311 Vitreous hemorrhage, right eye: Secondary | ICD-10-CM | POA: Diagnosis not present

## 2020-03-17 DIAGNOSIS — H44001 Unspecified purulent endophthalmitis, right eye: Secondary | ICD-10-CM

## 2020-03-17 DIAGNOSIS — H35033 Hypertensive retinopathy, bilateral: Secondary | ICD-10-CM

## 2020-03-17 DIAGNOSIS — H4312 Vitreous hemorrhage, left eye: Secondary | ICD-10-CM | POA: Diagnosis not present

## 2020-03-17 DIAGNOSIS — H3581 Retinal edema: Secondary | ICD-10-CM

## 2020-03-17 DIAGNOSIS — E113513 Type 2 diabetes mellitus with proliferative diabetic retinopathy with macular edema, bilateral: Secondary | ICD-10-CM | POA: Diagnosis not present

## 2020-03-17 DIAGNOSIS — Z961 Presence of intraocular lens: Secondary | ICD-10-CM

## 2020-03-17 DIAGNOSIS — H25812 Combined forms of age-related cataract, left eye: Secondary | ICD-10-CM

## 2020-03-17 DIAGNOSIS — I10 Essential (primary) hypertension: Secondary | ICD-10-CM

## 2020-03-17 MED ORDER — AFLIBERCEPT 2MG/0.05ML IZ SOLN FOR KALEIDOSCOPE
2.0000 mg | INTRAVITREAL | Status: AC | PRN
  Administered 2020-03-17: 2 mg via INTRAVITREAL

## 2020-03-17 NOTE — Progress Notes (Signed)
[facility] Note  03/17/2020     CHIEF COMPLAINT Patient presents for Retina Follow Up   HISTORY OF PRESENT ILLNESS: Gloria Gomez is a 50 y.o. female who presents to the clinic today for:   HPI    Retina Follow Up    Patient presents with  Diabetic Retinopathy.  In both eyes.  This started months ago.  Severity is moderate.  Duration of 4 weeks.  Since onset it is stable.  I, the attending physician,  performed the HPI with the patient and updated documentation appropriately.          Comments    50 y/o female pt here for 4 wk f/u for PDR w/DME OU.  No change in [place] OU.  Denies pain, FOL, floaters.  No gtts.  BS 121 this a.m.  A1C 9.0.       Last edited by Bernarda Caffey, MD on 03/17/2020  8:28 PM. (History)     Referring physician:   HISTORICAL INFORMATION:   Selected notes from the MEDICAL RECORD NUMBER Referred for DM exam   CURRENT MEDICATIONS: No current outpatient medications on file. (Ophthalmic Drugs)   No current facility-administered medications for this visit. (Ophthalmic Drugs)   Current Outpatient Medications (Other)  Medication Sig  . amLODipine (NORVASC) 5 MG tablet Take 1 tablet (5 mg total) by mouth daily.  . furosemide (LASIX) 20 MG tablet Take 1 tablet (20 mg total) by mouth 2 (two) times daily.  Marland Kitchen HYDROcodone-acetaminophen (NORCO/VICODIN) 5-325 MG tablet Take 1-2 tablets by mouth every 6 (six) hours as needed for moderate pain.  Marland Kitchen insulin glargine (LANTUS SOLOSTAR) 100 UNIT/ML Solostar Pen 40 Units SQ qhs, patient will be gradually titrating this dose to get to target fasting glucose range of 100-110  . JANUMET 50-1000 MG tablet TAKE 1 TABLET BY MOUTH TWICE DAILY WITH A MEAL  . liraglutide (VICTOZA) 18 MG/3ML SOPN INJECT 0.6MG  INTO THE SKIN DAILY FOR 1 WEEK, THEN INCREASE TO INJECT 1.2MG  DAILY  . losartan (COZAAR) 100 MG tablet Take 1 tablet (100 mg total) by mouth daily.  . metoCLOPramide (REGLAN) 5 MG tablet Take 1  tablet (5 mg total) by mouth 4 (four) times daily -  before meals and at bedtime.  . metoprolol succinate (TOPROL-XL) 100 MG 24 hr tablet 2 tabs po qd  . Multiple Vitamin (MULTIVITAMIN WITH MINERALS) TABS tablet Take 1 tablet by mouth daily.  Marland Kitchen OVER THE COUNTER MEDICATION Take 2 capsules by mouth daily. Neu Remedy  . pantoprazole (PROTONIX) 40 MG tablet Take 1 tablet by mouth once daily  . promethazine (PHENERGAN) 12.5 MG tablet 1-2 tabs po q6h prn nausea  . rosuvastatin (CRESTOR) 10 MG tablet Take 1 tablet (10 mg total) by mouth daily. (Patient taking differently: Take 10 mg by mouth every other day.)   No current facility-administered medications for this visit. (Other)      REVIEW OF SYSTEMS: ROS    Positive for: Endocrine, Eyes   Negative for: Constitutional, Gastrointestinal, Neurological, Skin, Genitourinary, Musculoskeletal, HENT, Cardiovascular, Respiratory, Psychiatric, Allergic/Imm, Heme/Lymph   Last edited by Matthew Folks, COA on 03/17/2020  2:34 PM. (History)       ALLERGIES Allergies  Allergen Reactions  . Gluten Meal Swelling  . Lisinopril Cough  . Pioglitazone Other (See Comments)    ELEVATED glucoses + worse chronic nausea    PAST MEDICAL HISTORY Past Medical History:  Diagnosis Date  . Abdominal bloating    likely from diab  gastroparesis.  Dr. Havery Moros started trial of reglan 03/2019.  Marland Kitchen Benign brain tumor ([place])    Cystic lesion in cerebral aqueduct region with mild hydrocephalus-- stable MRI 02/2016.  Surveillance MRI 05/2017 --dilated cerebral aqueduct related to aqueductal stenosis and subsequent mild hydrocephalus (due to the 11 mm stable cystic lesion in cerebral aqueduct---?congenitial?.  . Cataract    OU  . Dysmenorrhea    vicodin occ during first 2 days of cycle.  . Gluten intolerance    pt reports she underwent full GI w/u to r/o celiac dz  . Hepatic steatosis    ultrasound 08/2017. Hx of very mild elevation of ALT  . History of adenomatous  polyp of colon 04/08/2019   recall Feb 2024  . Hyperlipidemia, mixed   . Hypertensive retinopathy of both eyes   . Insomnia   . Iron deficiency 01/2019   Hb 11.3. Hemoccults neg x 3 03/19/19. EGD and colonoscopy 04/08/19 showed NO cause for IDA.  Pt does have menorrhagia, though, so she'll see her GYN.  started FeSO4 325 qd approx 04/14/19.  . Menorrhagia    resulting in [place] 2021  . Proliferative diabetic retinopathy of both eyes ([place])    steroid injections 10/2017--improved  . Sensorineural hearing loss of left ear    Sudden left hearing loss summer 2016--no improvement with steroids 01/2015 so brain MRI done by Dr. Redmond Baseman and it showed brain tumor that was determined to be benign.  Pt's hearing not bad enough for hearing aid as of 06/2016.  . Type 2 diabetes with complication (HCC)    +microalbuminuria, diab retpthy, diabetic gastroparesis (gastric emptying study mildly abnl 03/2017).  Recommended lantus 08/2018 but pt declined. Mild microalbuminuria.  Marland Kitchen Uterine fibroid   . White coat hypertension    Past Surgical History:  Procedure Laterality Date  . ANOSCOPY  05/12/2019   Procedure: normal exam, minimal hemorrhoid disease. Hyertrophied anal papila, benign appearing, posterior midline. Surgeon: Leighton Ruff MD  . CHOLECYSTECTOMY  2000  . COLONOSCOPY  04/08/2019   5 adenomas, recall 3 yrs; no cause for IDA found.  Hypertrophied anal papillae->bx showed low grade dysplasia; GI referred her to colorectal surgeon.  . ESOPHAGOGASTRODUODENOSCOPY  04/08/2019   mild chronic reactive gastritis. H pylori NEG.  No cause for IDA found.  . GAS INSERTION Right 10/31/2018   Procedure: Insertion Of C3F8 Gas;  Surgeon: Bernarda Caffey, MD;  Location: [place];  Service: Ophthalmology;  Laterality: Right;  . GASTRIC EMPTYING SCAN  04/20/2017   Mildly abnormal, particularly the 1st hour of emptying.  Marland Kitchen MEMBRANE PEEL Right 10/31/2018   Procedure: MEMBRANE PEEL;  Surgeon: Bernarda Caffey, MD;  Location: [place];   Service: Ophthalmology;  Laterality: Right;  . PARS PLANA VITRECTOMY Right 04/12/2018   Procedure: Right PARS PLANA VITRECTOMY WITH 25 GAUGE with intravitreal antibiotics;  Surgeon: Bernarda Caffey, MD;  Location: [place];  Service: Ophthalmology;  Laterality: Right;  . PARS PLANA VITRECTOMY Right 10/31/2018   Procedure: PARS PLANA VITRECTOMY WITH 25 GAUGE;  Surgeon: Bernarda Caffey, MD;  Location: [place];  Service: Ophthalmology;  Laterality: Right;  . PHOTOCOAGULATION WITH LASER Right 10/31/2018   Procedure: Photocoagulation With Laser;  Surgeon: Bernarda Caffey, MD;  Location: [place];  Service: Ophthalmology;  Laterality: Right;    FAMILY HISTORY Family History  Problem Relation Age of Onset  . Brain cancer Mother   . Diabetes Father   . Diabetes Maternal Grandmother   . Cataracts Maternal Grandmother   . Cervical cancer Paternal Grandmother   .  Colon cancer Maternal Grandfather 12  . Amblyopia Neg Hx   . Blindness Neg Hx   . Glaucoma Neg Hx   . Macular degeneration Neg Hx   . Retinal detachment Neg Hx   . Strabismus Neg Hx   . Retinitis pigmentosa Neg Hx   . Esophageal cancer Neg Hx   . Stomach cancer Neg Hx   . Rectal cancer Neg Hx     SOCIAL HISTORY Social History   Tobacco Use  . Smoking status: Never Smoker  . Smokeless tobacco: Never Used  Vaping Use  . Vaping Use: Never used  Substance Use Topics  . Alcohol use: No  . Drug use: No         OPHTHALMIC EXAM:  Base Eye Exam    Visual Acuity (Snellen - Linear)      Right Left   Dist [place] 20/25 -2    Dist cc  20/20 -2   Dist ph [place] NI        Tonometry (Tonopen, 2:36 PM)      Right Left   Pressure 13 14       Pupils      Dark Light Shape React APD   Right 3 2 Round Brisk None   Left 3 2 Round Brisk None       Visual Fields (Counting fingers)      Left Right    Full Full       Extraocular Movement      Right Left    Full, Ortho Full, Ortho       Neuro/Psych    Oriented x3: Yes   Mood/Affect: Normal        Dilation    Both eyes: 1.0% Mydriacyl, 2.5% Phenylephrine @ 2:36 PM        Slit Lamp and Fundus Exam    Slit Lamp Exam      Right Left   Lids/Lashes Dermatochalasis - upper lid, mild Meibomian gland dysfunction, Telangiectasia Dermatochalasis - upper lid, Meibomian gland dysfunction, Telangiectasia   Conjunctiva/Sclera White and quiet, sutures intact White and quiet   Cornea Clear, trace Punctate epithelial erosions, well healed temporal cataract wounds Trace Punctate epithelial erosions   Anterior Chamber Deep and quiet Deep and quiet   Iris Round and dilated, No NVI Round and dilated, No NVI   Lens PC IOL in good position, 1-2+ Posterior capsular opacification, PC folds 2-3+ Nuclear sclerosis, 2-3+ Cortical cataract   Vitreous post vitrectomy, trace pigment Vitreous syneresis, blood stained vitreous condensations improving centrally and settling inferiorly, residual blood clots inferiorly - improving, persistent blood stained floater just IN to disc - improving       Fundus Exam      Right Left   Disc mild Pallor, Sharp rim, temporal PPA Pink and sharp, Compact, PPA   C/D Ratio 0.2 0.0   Macula Flat, blunted foveal reflex, mild cystic changes ST to fovea-- persistent, persistent punctate exudate, scattered MA Flat, good foveal reflex, trace cystic changes, scattered MA; trace ERM, light focal laser changes, punctate exudates ST macula   Vessels attenuated, Tortuous attenuated, mild Tortuousity   Periphery Attached, scattered DBH greatest superiorly, 360 PRP, good laser fill in 360 attached, 360 PRP scars good posterior fill in -- room for fill in, inferior periphery obscured by persistent VH, scattered IRH          IMAGING AND PROCEDURES  Imaging and Procedures for @TODAY @  OCT, Retina - OU - Both Eyes  Right Eye Quality was good. Central Foveal Thickness: 336. Progression has been stable. Findings include abnormal foveal contour, epiretinal membrane, no SRF,  intraretinal hyper-reflective material (persistent IRF -- no significant improvement).   Left Eye Quality was good. Central Foveal Thickness: 287. Progression has improved. Findings include normal foveal contour, intraretinal fluid, no SRF, intraretinal hyper-reflective material (Interval improvement in IRF/cystic changes; interval improvement in vitreous opacities).   Notes *Images captured and stored on drive  Diagnosis / Impression:  DME OU OD: persistent IRF -- no significant improvement OS: Interval improvement in IRF/cystic changes; interval improvement in vitreous opacities  Clinical management:  See below  Abbreviations: NFP - Normal foveal profile. CME - cystoid macular edema. PED - pigment epithelial detachment. IRF - intraretinal fluid. SRF - subretinal fluid. EZ - ellipsoid zone. ERM - epiretinal membrane. ORA - outer retinal atrophy. ORT - outer retinal tubulation. SRHM - subretinal hyper-reflective material         Intravitreal Injection, Pharmacologic Agent - OD - Right Eye       Time Out 03/17/2020. 3:10 PM. Confirmed correct patient, procedure, site, and patient consented.   Anesthesia Topical anesthesia was used. Anesthetic medications included Lidocaine 2%, Proparacaine 0.5%.   Procedure Preparation included 5% betadine to ocular surface, eyelid speculum. A (32g) needle was used.   Injection:  2 mg aflibercept Alfonse Flavors) SOLN   NDC: L3553933, Lot: [id number], Expiration date: 08/27/2020   Route: Intravitreal, Site: Right Eye, Waste: 0.05 mL  Post-op Post injection exam found visual acuity of at least counting fingers. The patient tolerated the procedure well. There were no complications. The patient received written and verbal post procedure care education. Post injection medications were not given.   Notes **SAMPLE MEDICATION ADMINISTERED**               ASSESSMENT/PLAN:    ICD-10-CM   1. Proliferative diabetic retinopathy of both eyes with  macular edema associated with type 2 diabetes mellitus (HCC)  [id number] Intravitreal Injection, Pharmacologic Agent - OD - Right Eye    aflibercept (EYLEA) SOLN 2 mg  2. Retinal edema  H35.81 OCT, Retina - OU - Both Eyes  3. Vitreous hemorrhage of left eye (HCC)  H43.12   4. Vitreous hemorrhage, right eye ([place])  H43.11   5. Right endophthalmia  H44.001   6. Essential hypertension  I10   7. Hypertensive retinopathy of both eyes  H35.033   8. Combined forms of age-related cataract of left eye  H25.812   9. Pseudophakia of right eye  Z96.1    1,2.  Proliferative diabetic retinopathy w/ DME, OU  - HbA1c 11.0% (11.22.21), 8.3% (03.31.21, 12.2.20), 8.6% (06.30.20), 8.4% (02.14.20)  - s/p IVA OD #1 9.20.19, #2 (10.25.19), #3 (11.15.19), #4 (12.17.19), #5 (01.14.20), #6 (2.11.20), #7 (05.29.20), #8 (08.19.20), #9 (10.30.20), #10 (12.09.20), #11 (01.11.21), #12 (02.15.21), #13 (03.23.21), #14 (04.20.21), #15 (06.08.21), #16 (07.06.21)  - s/p IVA OS #1 9.27.19, #2 (10.25.19), #3 (11.15.19), #4 (12.17.19), #5 (01.14.20), #6 (2.11.20), #7 (04.26.20), #8 (05.29.20), #9 (06.26.20), #10 (08.05.20), #11 (11.11.20), #12 (12.09.20), #13 (01.11.21), #14 (02.15.21), #15 (03.23.21), #16 (04.20.21), #17 (06.08.21) -- IVA resistance, #18 (09.27.21)  - s/p IVE OD #1 (08.03.21) -- sample, #2 (09.10.21), #3 (10.08.21), #4 (11.23.21), #5 (12.21.21)  - s/p IVE OS #1 (10.25.21), #2 (11.23.21), #3 (12.21.21)  - S/P PRP OS (09.20.19), (5.19.20), (08.19.20), (04.28.21)  - S/P PRP OD (9.27.19 and 11.21.19), fill-in (04.14.20) (09.03.20, surgery)  - S/P focal laser OS (07.06.21)  - FA (9.20.19) shows +NVE OU  and leaking MA and capillary nonperfusion  - repeat FA 11.15.19 shows NV regressing OU  - pre-op: OD w/ VA stable at 20/25, but there is some preretinal fibrosis / tractional membranes just superior to disc and mild central DME  - s/p 25g PPV+MP+10% C3F8 gas OD (09.03.20) -- ERM/PRF removal OD  - BCVA improved to 20/25  from 20/30 OD              - fibrosis/ERM stably improved; retina attached  - OCT shows OD: persistent IRF/DME; OS: Interval improvement in IRF/cystic changes; interval improvement in vitreous opacities  - recommend IVE OD #6 [SAMPLE] (01.19.22) for DME **change in insurance for 2022-- unable to verify injection benefit**  - pt wishes to proceed with injection  - RBA of procedure discussed, questions answered  - informed consent obtained and signed  - see procedure note  - Avastin informed consent form signed and scanned on 01.11.2021 (OU)  - Eylea informed consent form signed and scanned on 08.03.2021  - [email or web address] benefits investigation started, 08.03.21 -- approved as of 09.10.21 -- 2022 insurance verification for injection pending as of 01.19.22  - f/u 2 weeks, DFE, OCT, possible injection OS +/- PRP laser OS  3. Vitreous hemorrhage OS -- recurrent, improving  - recurrent VH, onset 09.22.21  - etiology: secondary to PDR as described above (no RT/RD on exam)  - s/p PRP OS (9.20.19), (05.19.20), (08.19.20), (04.28.21)  - s/p IVA OS on 4.26.20, 5.29.20, 6.26.20, 8.5.20,11.11.20, 12.06.20 and so on as above  - VH persistent, but mild  - BCVA improved to 20/20 from 20/25-2  - f/u 2 weeks, DFE, OCT, possible injection(s) +/- PRP laser OS  4. History of Endophthalmitis OD  - s/p IVA OU 04/09/2018  - s/p 25g PPV w/ intravitreal vanc, ceftaz and cefepime OD, 2.14.2020  - s/p intravitreal tap / vanc and ceflaz injections (02.16.20)             - gram stain (2.14.20) shows G+ cocci, WBCs mostly PMNs;   - repeat gram stain from t/i (2.16.20) -- no organisms, just WBCs             - cultures from vitreous grew rare Staph warneri; cultures from t/i -- no growth             - doing well, BCVA 20/30             - inflammation/posterior debris resolved             - IOP 13 off Brimonidine  - completed po pred taper -- caused significant elevations in BG  - monitor  5. History of Vitreous Hemorrhage  OD -- cleared from PPV x2 for endophthalmitis and ERM/preretinal fibrosis  - secondary to PDR as described below  - S/P IVA OD #1 (09.20.19), #2 (10.25.19), #3 (11.15.19), #4 (12.16.19), #5 (03/12/2018)  - S/P PRP OD #1 (09.27.19), fill in OD (11.21.19) -- each somewhat limited inferiorly by residual VH  6,7. Hypertensive retinopathy OU  - BP elevated today (9.27.21) -- 164/91, left arm  - discussed importance of tight BP control.  - monitor  8. Combined form age related cataract OS-   - The symptoms of cataract, surgical options, and treatments and risks were discussed with patient.  - discussed diagnosis and progression  9. Pseudophakia OD  - s/p CE/IOL OD (Dr. Zenia Resides, 12.11.20)  - beautiful surgery, doing well  Ophthalmic Meds Ordered this visit:  Meds ordered this encounter  Medications  .  aflibercept (EYLEA) SOLN 2 mg      Return in about 2 weeks (around 03/31/2020) for f/u PDR OU, DFE, OCT.  There are no Patient Instructions on file for this visit.  This document serves as a record of services personally performed by Gardiner Sleeper, MD, PhD. It was created on their behalf by Estill Bakes, COT an ophthalmic technician. The creation of this record is the provider's dictation and/or activities during the visit.    Electronically signed by: Estill Bakes, COT 1.19.22 @ 8:43 PM   This document serves as a record of services personally performed by Gardiner Sleeper, MD, PhD. It was created on their behalf by San Jetty. Owens Shark, OA an ophthalmic technician. The creation of this record is the provider's dictation and/or activities during the visit.    Electronically signed by: San Jetty. Marguerita Merles 01.19.2022 8:43 PM  Gardiner Sleeper, M.D., Ph.D. Diseases & Surgery of the Retina and [place] 03/17/2020   I have reviewed the above documentation for accuracy and completeness, and I agree with the above. Gardiner Sleeper, M.D., Ph.D. 03/17/20 8:43  PM   Abbreviations: M myopia (nearsighted); A astigmatism; H hyperopia (farsighted); P presbyopia; Mrx spectacle prescription;  CTL contact lenses; OD right eye; OS left eye; OU both eyes  XT exotropia; ET esotropia; PEK punctate epithelial keratitis; PEE punctate epithelial erosions; DES dry eye syndrome; MGD meibomian gland dysfunction; ATs artificial tears; PFAT's preservative free artificial tears; [place] nuclear sclerotic cataract; PSC posterior subcapsular cataract; ERM epi-retinal membrane; PVD posterior vitreous detachment; RD retinal detachment; DM diabetes mellitus; DR diabetic retinopathy; NPDR non-proliferative diabetic retinopathy; PDR proliferative diabetic retinopathy; CSME clinically significant macular edema; DME diabetic macular edema; dbh dot blot hemorrhages; CWS cotton wool spot; POAG primary open angle glaucoma; C/D cup-to-disc ratio; HVF humphrey visual field; GVF goldmann visual field; OCT optical coherence tomography; IOP intraocular pressure; BRVO Branch retinal vein occlusion; CRVO central retinal vein occlusion; CRAO central retinal artery occlusion; BRAO branch retinal artery occlusion; RT retinal tear; SB scleral buckle; PPV pars plana vitrectomy; VH Vitreous hemorrhage; PRP panretinal laser photocoagulation; IVK intravitreal kenalog; VMT vitreomacular traction; MH Macular hole;  NVD neovascularization of the disc; NVE neovascularization elsewhere; AREDS age related eye disease study; ARMD age related macular degeneration; POAG primary open angle glaucoma; EBMD epithelial/anterior basement membrane dystrophy; ACIOL anterior chamber intraocular lens; IOL intraocular lens; PCIOL posterior chamber intraocular lens; Phaco/IOL phacoemulsification with intraocular lens placement; [place] photorefractive keratectomy; LASIK laser assisted in situ keratomileusis; HTN hypertension; DM diabetes mellitus; COPD chronic obstructive pulmonary disease

## 2020-03-18 ENCOUNTER — Encounter (INDEPENDENT_AMBULATORY_CARE_PROVIDER_SITE_OTHER): Payer: Self-pay | Admitting: Ophthalmology

## 2020-03-18 DIAGNOSIS — H3581 Retinal edema: Secondary | ICD-10-CM

## 2020-03-18 DIAGNOSIS — H4311 Vitreous hemorrhage, right eye: Secondary | ICD-10-CM

## 2020-03-18 DIAGNOSIS — I10 Essential (primary) hypertension: Secondary | ICD-10-CM

## 2020-03-18 DIAGNOSIS — H35033 Hypertensive retinopathy, bilateral: Secondary | ICD-10-CM

## 2020-03-18 DIAGNOSIS — H4312 Vitreous hemorrhage, left eye: Secondary | ICD-10-CM

## 2020-03-18 DIAGNOSIS — H25812 Combined forms of age-related cataract, left eye: Secondary | ICD-10-CM

## 2020-03-18 DIAGNOSIS — E113513 Type 2 diabetes mellitus with proliferative diabetic retinopathy with macular edema, bilateral: Secondary | ICD-10-CM

## 2020-03-18 DIAGNOSIS — H44001 Unspecified purulent endophthalmitis, right eye: Secondary | ICD-10-CM

## 2020-03-18 DIAGNOSIS — Z961 Presence of intraocular lens: Secondary | ICD-10-CM

## 2020-03-30 NOTE — Progress Notes (Signed)
[facility] Note  03/31/2020     CHIEF COMPLAINT Patient presents for Retina Follow Up   HISTORY OF PRESENT ILLNESS: Gloria Gomez is a 50 y.o. female who presents to the clinic today for:   HPI    Retina Follow Up    Patient presents with  Diabetic Retinopathy.  In both eyes.  This started months ago.  Severity is moderate.  Duration of 2 weeks.  Since onset it is stable.  I, the attending physician,  performed the HPI with the patient and updated documentation appropriately.          Comments    50 y/o female pt here for 2 wk f/u for PDR w/DME OU.  No change in [place] OU noticed.  Denies pain, FOL, floaters.  No gtts.  BS 139 this a.m.  A1C 9.2       Last edited by Bernarda Caffey, MD on 03/31/2020 10:39 PM. (History)     Referring physician:   HISTORICAL INFORMATION:   Selected notes from the MEDICAL RECORD NUMBER Referred for DM exam   CURRENT MEDICATIONS: No current outpatient medications on file. (Ophthalmic Drugs)   No current facility-administered medications for this visit. (Ophthalmic Drugs)   Current Outpatient Medications (Other)  Medication Sig  . amLODipine (NORVASC) 5 MG tablet Take 1 tablet (5 mg total) by mouth daily.  . furosemide (LASIX) 20 MG tablet Take 1 tablet (20 mg total) by mouth 2 (two) times daily.  Marland Kitchen HYDROcodone-acetaminophen (NORCO/VICODIN) 5-325 MG tablet Take 1-2 tablets by mouth every 6 (six) hours as needed for moderate pain.  Marland Kitchen insulin glargine (LANTUS SOLOSTAR) 100 UNIT/ML Solostar Pen 40 Units SQ qhs, patient will be gradually titrating this dose to get to target fasting glucose range of 100-110  . JANUMET 50-1000 MG tablet TAKE 1 TABLET BY MOUTH TWICE DAILY WITH A MEAL  . liraglutide (VICTOZA) 18 MG/3ML SOPN INJECT 0.6MG INTO THE SKIN DAILY FOR 1 WEEK, THEN INCREASE TO INJECT 1.2MG DAILY  . losartan (COZAAR) 100 MG tablet Take 1 tablet (100 mg total) by mouth daily.  . metoCLOPramide (REGLAN) 5 MG tablet Take 1  tablet (5 mg total) by mouth 4 (four) times daily -  before meals and at bedtime.  . metoprolol succinate (TOPROL-XL) 100 MG 24 hr tablet 2 tabs po qd  . Multiple Vitamin (MULTIVITAMIN WITH MINERALS) TABS tablet Take 1 tablet by mouth daily.  Marland Kitchen OVER THE COUNTER MEDICATION Take 2 capsules by mouth daily. Neu Remedy  . pantoprazole (PROTONIX) 40 MG tablet Take 1 tablet by mouth once daily  . promethazine (PHENERGAN) 12.5 MG tablet 1-2 tabs po q6h prn nausea  . rosuvastatin (CRESTOR) 10 MG tablet Take 1 tablet (10 mg total) by mouth daily. (Patient taking differently: Take 10 mg by mouth every other day.)   No current facility-administered medications for this visit. (Other)      REVIEW OF SYSTEMS: ROS    Positive for: Endocrine, Eyes   Negative for: Constitutional, Gastrointestinal, Neurological, Skin, Genitourinary, Musculoskeletal, HENT, Cardiovascular, Respiratory, Psychiatric, Allergic/Imm, Heme/Lymph   Last edited by Matthew Folks, COA on 03/31/2020  2:30 PM. (History)       ALLERGIES Allergies  Allergen Reactions  . Gluten Meal Swelling  . Lisinopril Cough  . Pioglitazone Other (See Comments)    ELEVATED glucoses + worse chronic nausea    PAST MEDICAL HISTORY Past Medical History:  Diagnosis Date  . Abdominal bloating    likely from diab  gastroparesis.  Dr. Havery Moros started trial of reglan 03/2019.  Marland Kitchen Benign brain tumor ([place])    Cystic lesion in cerebral aqueduct region with mild hydrocephalus-- stable MRI 02/2016.  Surveillance MRI 05/2017 --dilated cerebral aqueduct related to aqueductal stenosis and subsequent mild hydrocephalus (due to the 11 mm stable cystic lesion in cerebral aqueduct---?congenitial?.  . Cataract    OU  . Dysmenorrhea    vicodin occ during first 2 days of cycle.  . Gluten intolerance    pt reports she underwent full GI w/u to r/o celiac dz  . Hepatic steatosis    ultrasound 08/2017. Hx of very mild elevation of ALT  . History of adenomatous  polyp of colon 04/08/2019   recall Feb 2024  . Hyperlipidemia, mixed   . Hypertensive retinopathy    OU  . Hypertensive retinopathy of both eyes   . Insomnia   . Iron deficiency 01/2019   Hb 11.3. Hemoccults neg x 3 03/19/19. EGD and colonoscopy 04/08/19 showed NO cause for IDA.  Pt does have menorrhagia, though, so she'll see her GYN.  started FeSO4 325 qd approx 04/14/19.  . Menorrhagia    resulting in [place] 2021  . Proliferative diabetic retinopathy of both eyes ([place])    steroid injections 10/2017--improved  . Sensorineural hearing loss of left ear    Sudden left hearing loss summer 2016--no improvement with steroids 01/2015 so brain MRI done by Dr. Redmond Baseman and it showed brain tumor that was determined to be benign.  Pt's hearing not bad enough for hearing aid as of 06/2016.  . Type 2 diabetes with complication (HCC)    +microalbuminuria, diab retpthy, diabetic gastroparesis (gastric emptying study mildly abnl 03/2017).  Recommended lantus 08/2018 but pt declined. Mild microalbuminuria.  Marland Kitchen Uterine fibroid   . White coat hypertension    Past Surgical History:  Procedure Laterality Date  . ANOSCOPY  05/12/2019   Procedure: normal exam, minimal hemorrhoid disease. Hyertrophied anal papila, benign appearing, posterior midline. Surgeon: Leighton Ruff MD  . CHOLECYSTECTOMY  2000  . COLONOSCOPY  04/08/2019   5 adenomas, recall 3 yrs; no cause for IDA found.  Hypertrophied anal papillae->bx showed low grade dysplasia; GI referred her to colorectal surgeon.  . ESOPHAGOGASTRODUODENOSCOPY  04/08/2019   mild chronic reactive gastritis. H pylori NEG.  No cause for IDA found.  . GAS INSERTION Right 10/31/2018   Procedure: Insertion Of C3F8 Gas;  Surgeon: Bernarda Caffey, MD;  Location: [facility];  Service: Ophthalmology;  Laterality: Right;  . GASTRIC EMPTYING SCAN  04/20/2017   Mildly abnormal, particularly the 1st hour of emptying.  Marland Kitchen MEMBRANE PEEL Right 10/31/2018   Procedure: MEMBRANE PEEL;  Surgeon: Bernarda Caffey, MD;  Location: [facility];  Service: Ophthalmology;  Laterality: Right;  . PARS PLANA VITRECTOMY Right 04/12/2018   Procedure: Right PARS PLANA VITRECTOMY WITH 25 GAUGE with intravitreal antibiotics;  Surgeon: Bernarda Caffey, MD;  Location: [facility];  Service: Ophthalmology;  Laterality: Right;  . PARS PLANA VITRECTOMY Right 10/31/2018   Procedure: PARS PLANA VITRECTOMY WITH 25 GAUGE;  Surgeon: Bernarda Caffey, MD;  Location: [facility];  Service: Ophthalmology;  Laterality: Right;  . PHOTOCOAGULATION WITH LASER Right 10/31/2018   Procedure: Photocoagulation With Laser;  Surgeon: Bernarda Caffey, MD;  Location: [facility] [place];  Service: Ophthalmology;  Laterality: Right;    FAMILY HISTORY Family History  Problem Relation Age of Onset  . Brain cancer Mother   . Diabetes Father   . Diabetes Maternal Grandmother   . Cataracts Maternal Grandmother   .  Cervical cancer Paternal Grandmother   . Colon cancer Maternal Grandfather 22  . Amblyopia Neg Hx   . Blindness Neg Hx   . Glaucoma Neg Hx   . Macular degeneration Neg Hx   . Retinal detachment Neg Hx   . Strabismus Neg Hx   . Retinitis pigmentosa Neg Hx   . Esophageal cancer Neg Hx   . Stomach cancer Neg Hx   . Rectal cancer Neg Hx     SOCIAL HISTORY Social History   Tobacco Use  . Smoking status: Never Smoker  . Smokeless tobacco: Never Used  Vaping Use  . Vaping Use: Never used  Substance Use Topics  . Alcohol use: No  . Drug use: No         OPHTHALMIC EXAM:  Base Eye Exam    Visual Acuity (Snellen - Linear)      Right Left   Dist [place] 20/25 -2    Dist cc  20/30 -2   Dist ph [place] NI    Dist ph cc  20/25 -2   Correction: Glasses       Tonometry (Tonopen, 2:33 PM)      Right Left   Pressure 12 13       Pupils      Dark Light Shape React APD   Right 3 2 Round Brisk None   Left 3 2 Round Brisk None       Visual Fields (Counting fingers)      Left Right    Full Full       Extraocular Movement      Right Left    Full, Ortho Full,  Ortho       Neuro/Psych    Oriented x3: Yes   Mood/Affect: Normal       Dilation    Both eyes: 1.0% Mydriacyl, 2.5% Phenylephrine @ 2:32 PM        Slit Lamp and Fundus Exam    Slit Lamp Exam      Right Left   Lids/Lashes Dermatochalasis - upper lid, mild Meibomian gland dysfunction, Telangiectasia Dermatochalasis - upper lid, Meibomian gland dysfunction, Telangiectasia   Conjunctiva/Sclera White and quiet, sutures intact White and quiet   Cornea Clear, trace Punctate epithelial erosions, well healed temporal cataract wounds Trace Punctate epithelial erosions   Anterior Chamber Deep and quiet Deep and quiet   Iris Round and dilated, No NVI Round and dilated, No NVI   Lens PC IOL in good position, 1-2+ Posterior capsular opacification, PC folds 2-3+ Nuclear sclerosis, 2-3+ Cortical cataract   Vitreous post vitrectomy, trace pigment Vitreous syneresis, blood stained vitreous condensations improving centrally and settling inferiorly, residual blood clots inferiorly - improving, persistent blood stained floater just IN to disc - improving       Fundus Exam      Right Left   Disc mild Pallor, Sharp rim, temporal PPA Pink and sharp, Compact, PPA   C/D Ratio 0.2 0.0   Macula Flat, blunted foveal reflex, mild cystic changes ST to fovea--improved, persistent punctate exudate, scattered MA ST mac Flat, good foveal reflex, trace cystic changes, scattered MA; trace ERM, light focal laser changes, punctate exudates ST macula--improved   Vessels attenuated, Tortuous attenuated, mild Tortuousity   Periphery Attached, scattered DBH greatest superiorly, 360 PRP, good laser fill in 360 attached, scattered IRH; 360 PRP scars -- room for fill in, inferior periphery obscured by persistent VH          IMAGING AND PROCEDURES  Imaging  and Procedures for _0 @  OCT, Retina - OU - Both Eyes       Right Eye Quality was good. Central Foveal Thickness: 316. Progression has improved. Findings include  abnormal foveal contour, epiretinal membrane, no SRF, intraretinal hyper-reflective material, intraretinal fluid (Interval improvement in IRF).   Left Eye Quality was good. Central Foveal Thickness: 282. Progression has improved. Findings include normal foveal contour, intraretinal fluid, no SRF, intraretinal hyper-reflective material (Persistent non-central IRF/cystic changes; mild improvement in vitreous opacities).   Notes *Images captured and stored on drive  Diagnosis / Impression:  DME OU OD: Interval improvement in IRF OS: Persistent non-central IRF/cystic changes; mild improvement in vitreous opacities  Clinical management:  See below  Abbreviations: NFP - Normal foveal profile. CME - cystoid macular edema. PED - pigment epithelial detachment. IRF - intraretinal fluid. SRF - subretinal fluid. EZ - ellipsoid zone. ERM - epiretinal membrane. ORA - outer retinal atrophy. ORT - outer retinal tubulation. SRHM - subretinal hyper-reflective material         Panretinal Photocoagulation - OS - Left Eye       Time Out Confirmed correct patient, procedure, site, and patient consented.   Anesthesia Topical anesthesia was used. Anesthetic medications included Proparacaine 0.5%.   Post-op The patient tolerated the procedure well. There were no complications. The patient received written and verbal post procedure care education.   Notes LASER PROCEDURE NOTE  Diagnosis:   Proliferative Diabetic Retinopathy, left EYE  Procedure:  Pan-retinal photocoagulation using slit lamp laser left EYE, fill-in  Anesthesia:  Topical  Surgeon: Bernarda Caffey, MD, PhD   Informed consent obtained, operative eye marked, and time out performed prior to initiation of laser.   Lumenis [id number] slit lamp laser Pattern: 3x3 square Power: 290 mW Duration: 30 msec  Spot size: 200 microns  # spots: 1418 spots -- 563 fill-in  Complications: None.  Patient tolerated the procedure well and  received written and verbal post-procedure care information/education.                 ASSESSMENT/PLAN:    ICD-10-CM   1. Proliferative diabetic retinopathy of both eyes with macular edema associated with type 2 diabetes mellitus ([place])  O75.6433 Panretinal Photocoagulation - OS - Left Eye  2. Retinal edema  H35.81 OCT, Retina - OU - Both Eyes  3. Vitreous hemorrhage of left eye (HCC)  H43.12 Panretinal Photocoagulation - OS - Left Eye  4. Right endophthalmia  H44.001   5. Vitreous hemorrhage, right eye ([place])  H43.11   6. Essential hypertension  I10   7. Hypertensive retinopathy of both eyes  H35.033   8. Combined forms of age-related cataract of left eye  H25.812   9. Pseudophakia of right eye  Z96.1    1,2.  Proliferative diabetic retinopathy w/ DME, OU  - HbA1c 11.0% (11.22.21), 8.3% (03.31.21, 12.2.20), 8.6% (06.30.20), 8.4% (02.14.20)  - s/p IVA OD #1 9.20.19, #2 (10.25.19), #3 (11.15.19), #4 (12.17.19), #5 (01.14.20), #6 (2.11.20), #7 (05.29.20), #8 (08.19.20), #9 (10.30.20), #10 (12.09.20), #11 (01.11.21), #12 (02.15.21), #13 (03.23.21), #14 (04.20.21), #15 (06.08.21), #16 (07.06.21)  - s/p IVA OS #1 9.27.19, #2 (10.25.19), #3 (11.15.19), #4 (12.17.19), #5 (01.14.20), #6 (2.11.20), #7 (04.26.20), #8 (05.29.20), #9 (06.26.20), #10 (08.05.20), #11 (11.11.20), #12 (12.09.20), #13 (01.11.21), #14 (02.15.21), #15 (03.23.21), #16 (04.20.21), #17 (06.08.21) -- IVA resistance, #18 (09.27.21)  - s/p IVE OD #1 (08.03.21) -- sample, #2 (09.10.21), #3 (10.08.21), #4 (11.23.21), #5 (12.21.21), #6 (01.19.22) sample  - s/p IVE OS #1 (  10.25.21), #2 (11.23.21), #3 (12.21.21)  - S/P PRP OS (09.20.19), (5.19.20), (08.19.20), (04.28.21)  - S/P PRP OD (9.27.19 and 11.21.19), fill-in (04.14.20) (09.03.20, surgery)  - S/P focal laser OS (07.06.21)  - FA (9.20.19) shows +NVE OU and leaking MA and capillary nonperfusion  - repeat FA 11.15.19 shows NV regressing OU  - pre-op: OD w/ VA stable at 20/25,  but there is some preretinal fibrosis / tractional membranes just superior to disc and mild central DME  - s/p 25g PPV+MP+10% C3F8 gas OD (09.03.20) -- ERM/PRF removal OD  - BCVA stable at 20/25 OD              - fibrosis/ERM stably improved; retina attached  - OCT shows OD: Interval improvement in IRF; OS: Persistent non-central IRF/cystic changes; mild improvement in vitreous opacities  - Avastin informed consent form signed and scanned on 01.11.2021 (OU)  - Eylea informed consent form signed and scanned on 08.03.2021  - [email or web address] benefits investigation started, 08.03.21 -- approved as of 09.10.21 -- 2022 insurance verification for injection pending as of 01.19.22  - f/u 2 weeks, DFE, OCT, possible inj.  3. Vitreous hemorrhage OS -- recurrent, improving  - recurrent VH, onset 09.22.21  - etiology: secondary to PDR as described above (no RT/RD on exam)  - s/p PRP OS (9.20.19), (05.19.20), (08.19.20), (04.28.21)  - s/p IVA OS on 4.26.20, 5.29.20, 6.26.20, 8.5.20,11.11.20, 12.06.20 and so on as above  - VH clear centrally and settled inferiorly  - BCVA slighlty decreased to 20/25 from 20/20  - recommend PRP OS fill-in OS today, 2.2.22  - RBA of procedure discussed, questions answered  - informed consent obtained and signed  - see procedure note  - start Lotemax SM QID x7 days -- samples given   - f/u 2 weeks DFE, OCT, possible inj.  4. History of Endophthalmitis OD  - s/p IVA OU 04/09/2018  - s/p 25g PPV w/ intravitreal vanc, ceftaz and cefepime OD, 2.14.2020  - s/p intravitreal tap / vanc and ceflaz injections (02.16.20)             - gram stain (2.14.20) shows G+ cocci, WBCs mostly PMNs;   - repeat gram stain from t/i (2.16.20) -- no organisms, just WBCs             - cultures from vitreous grew rare Staph warneri; cultures from t/i -- no growth             - doing well, BCVA 20/30             - inflammation/posterior debris resolved             - IOP 13 off Brimonidine  - completed  po pred taper -- caused significant elevations in BG  - monitor  5. History of Vitreous Hemorrhage OD -- cleared from PPV x2 for endophthalmitis and ERM/preretinal fibrosis  - secondary to PDR as described below  - S/P IVA OD #1 (09.20.19), #2 (10.25.19), #3 (11.15.19), #4 (12.16.19), #5 (03/12/2018)  - S/P PRP OD #1 (09.27.19), fill in OD (11.21.19) -- each somewhat limited inferiorly by residual VH  6,7. Hypertensive retinopathy OU  - BP elevated today (9.27.21) -- 164/91, left arm  - discussed importance of tight BP control.  - monitor  8. Combined form age related cataract OS-   - The symptoms of cataract, surgical options, and treatments and risks were discussed with patient.  - discussed diagnosis and progression  9. Pseudophakia OD  - s/p  CE/IOL OD (Dr. Zenia Resides, 12.11.20)  - beautiful surgeries, doing well  Ophthalmic Meds Ordered this visit:  No orders of the defined types were placed in this encounter.     Return in about 2 weeks (around 04/14/2020) for 2 wk f/u for PDR OU/VH OS.  S/p PRP fill-in OS.  DFE/OCT/poss. inj..  There are no Patient Instructions on file for this visit.  This document serves as a record of services personally performed by Gardiner Sleeper, MD, PhD. It was created on their behalf by Roselee Nova, COMT. The creation of this record is the provider's dictation and/or activities during the visit.  Electronically signed by: Roselee Nova, COMT 03/31/20 10:47 PM  This document serves as a record of services personally performed by Gardiner Sleeper, MD, PhD. It was created on their behalf by Estill Bakes, COT an ophthalmic technician. The creation of this record is the provider's dictation and/or activities during the visit.    Electronically signed by: Estill Bakes, COT 2.2.22 @ 10:47 PM  Gardiner Sleeper, M.D., Ph.D. Diseases & Surgery of the Retina and [place] 2.2.22  I have reviewed the above documentation for  accuracy and completeness, and I agree with the above. Gardiner Sleeper, M.D., Ph.D. 03/31/20 10:47 PM  Abbreviations: M myopia (nearsighted); A astigmatism; H hyperopia (farsighted); P presbyopia; Mrx spectacle prescription;  CTL contact lenses; OD right eye; OS left eye; OU both eyes  XT exotropia; ET esotropia; PEK punctate epithelial keratitis; PEE punctate epithelial erosions; DES dry eye syndrome; MGD meibomian gland dysfunction; ATs artificial tears; PFAT's preservative free artificial tears; [place] nuclear sclerotic cataract; PSC posterior subcapsular cataract; ERM epi-retinal membrane; PVD posterior vitreous detachment; RD retinal detachment; DM diabetes mellitus; DR diabetic retinopathy; NPDR non-proliferative diabetic retinopathy; PDR proliferative diabetic retinopathy; CSME clinically significant macular edema; DME diabetic macular edema; dbh dot blot hemorrhages; CWS cotton wool spot; POAG primary open angle glaucoma; C/D cup-to-disc ratio; HVF humphrey visual field; GVF goldmann visual field; OCT optical coherence tomography; IOP intraocular pressure; BRVO Branch retinal vein occlusion; CRVO central retinal vein occlusion; CRAO central retinal artery occlusion; BRAO branch retinal artery occlusion; RT retinal tear; SB scleral buckle; PPV pars plana vitrectomy; VH Vitreous hemorrhage; PRP panretinal laser photocoagulation; IVK intravitreal kenalog; VMT vitreomacular traction; MH Macular hole;  NVD neovascularization of the disc; NVE neovascularization elsewhere; AREDS age related eye disease study; ARMD age related macular degeneration; POAG primary open angle glaucoma; EBMD epithelial/anterior basement membrane dystrophy; ACIOL anterior chamber intraocular lens; IOL intraocular lens; PCIOL posterior chamber intraocular lens; Phaco/IOL phacoemulsification with intraocular lens placement; [place] photorefractive keratectomy; LASIK laser assisted in situ keratomileusis; HTN hypertension; DM diabetes mellitus;  COPD chronic obstructive pulmonary disease

## 2020-03-31 ENCOUNTER — Encounter (INDEPENDENT_AMBULATORY_CARE_PROVIDER_SITE_OTHER): Payer: Self-pay | Admitting: Ophthalmology

## 2020-03-31 ENCOUNTER — Other Ambulatory Visit: Payer: Self-pay

## 2020-03-31 ENCOUNTER — Ambulatory Visit (INDEPENDENT_AMBULATORY_CARE_PROVIDER_SITE_OTHER): Payer: 59 | Admitting: Ophthalmology

## 2020-03-31 DIAGNOSIS — H35033 Hypertensive retinopathy, bilateral: Secondary | ICD-10-CM

## 2020-03-31 DIAGNOSIS — H44001 Unspecified purulent endophthalmitis, right eye: Secondary | ICD-10-CM

## 2020-03-31 DIAGNOSIS — H4312 Vitreous hemorrhage, left eye: Secondary | ICD-10-CM | POA: Diagnosis not present

## 2020-03-31 DIAGNOSIS — H3581 Retinal edema: Secondary | ICD-10-CM

## 2020-03-31 DIAGNOSIS — H25812 Combined forms of age-related cataract, left eye: Secondary | ICD-10-CM

## 2020-03-31 DIAGNOSIS — H4311 Vitreous hemorrhage, right eye: Secondary | ICD-10-CM

## 2020-03-31 DIAGNOSIS — E113513 Type 2 diabetes mellitus with proliferative diabetic retinopathy with macular edema, bilateral: Secondary | ICD-10-CM

## 2020-03-31 DIAGNOSIS — Z961 Presence of intraocular lens: Secondary | ICD-10-CM

## 2020-03-31 DIAGNOSIS — I10 Essential (primary) hypertension: Secondary | ICD-10-CM

## 2020-04-13 ENCOUNTER — Other Ambulatory Visit: Payer: Self-pay | Admitting: Family Medicine

## 2020-04-13 DIAGNOSIS — E118 Type 2 diabetes mellitus with unspecified complications: Secondary | ICD-10-CM

## 2020-04-13 NOTE — Progress Notes (Signed)
[facility] Note  04/14/2020     CHIEF COMPLAINT Patient presents for Retina Follow Up   HISTORY OF PRESENT ILLNESS: Gloria Gomez is a 50 y.o. female who presents to the clinic today for:   HPI    Retina Follow Up    Patient presents with  Diabetic Retinopathy.  In both eyes.  This started weeks ago.  Severity is moderate.  Duration of weeks.  Since onset it is stable.  I, the attending physician,  performed the HPI with the patient and updated documentation appropriately.          Comments    Pt states vision is the same OU.  Pt denies eye pain or discomfort and denies any new or worsening floaters or fol OU.       Last edited by Bernarda Caffey, MD on 04/14/2020  4:11 PM. (History)     Referring physician:   HISTORICAL INFORMATION:   Selected notes from the MEDICAL RECORD NUMBER Referred for DM exam   CURRENT MEDICATIONS: No current outpatient medications on file. (Ophthalmic Drugs)   No current facility-administered medications for this visit. (Ophthalmic Drugs)   Current Outpatient Medications (Other)  Medication Sig  . amLODipine (NORVASC) 5 MG tablet Take 1 tablet (5 mg total) by mouth daily.  . furosemide (LASIX) 20 MG tablet Take 1 tablet (20 mg total) by mouth 2 (two) times daily.  Marland Kitchen HYDROcodone-acetaminophen (NORCO/VICODIN) 5-325 MG tablet Take 1-2 tablets by mouth every 6 (six) hours as needed for moderate pain.  Marland Kitchen insulin glargine (LANTUS SOLOSTAR) 100 UNIT/ML Solostar Pen 40 Units SQ qhs, patient will be gradually titrating this dose to get to target fasting glucose range of 100-110  . JANUMET 50-1000 MG tablet TAKE 1 TABLET BY MOUTH TWICE DAILY WITH A MEAL  . losartan (COZAAR) 100 MG tablet Take 1 tablet (100 mg total) by mouth daily.  . metoCLOPramide (REGLAN) 5 MG tablet Take 1 tablet (5 mg total) by mouth 4 (four) times daily -  before meals and at bedtime.  . metoprolol succinate (TOPROL-XL) 100 MG 24 hr tablet 2 tabs po qd   . Multiple Vitamin (MULTIVITAMIN WITH MINERALS) TABS tablet Take 1 tablet by mouth daily.  Marland Kitchen OVER THE COUNTER MEDICATION Take 2 capsules by mouth daily. Neu Remedy  . pantoprazole (PROTONIX) 40 MG tablet Take 1 tablet by mouth once daily  . promethazine (PHENERGAN) 12.5 MG tablet 1-2 tabs po q6h prn nausea  . rosuvastatin (CRESTOR) 10 MG tablet Take 1 tablet (10 mg total) by mouth daily. (Patient taking differently: Take 10 mg by mouth every other day.)  . VICTOZA 18 MG/3ML SOPN INJECT 0.6MG INTO THE SKIN DAILY FOR 1 WEEK THEN INCREASE TO INJECT 1.2MG DAILY   No current facility-administered medications for this visit. (Other)      REVIEW OF SYSTEMS: ROS    Positive for: Endocrine, Eyes   Negative for: Constitutional, Gastrointestinal, Neurological, Skin, Genitourinary, Musculoskeletal, HENT, Cardiovascular, Respiratory, Psychiatric, Allergic/Imm, Heme/Lymph   Last edited by [name] on 04/14/2020  2:39 PM. (History)       ALLERGIES Allergies  Allergen Reactions  . Gluten Meal Swelling  . Lisinopril Cough  . Pioglitazone Other (See Comments)    ELEVATED glucoses + worse chronic nausea    PAST MEDICAL HISTORY Past Medical History:  Diagnosis Date  . Abdominal bloating    likely from diab gastroparesis.  Dr. Havery Moros started trial of reglan 03/2019.  Marland Kitchen Benign brain tumor (  [place])    Cystic lesion in cerebral aqueduct region with mild hydrocephalus-- stable MRI 02/2016.  Surveillance MRI 05/2017 --dilated cerebral aqueduct related to aqueductal stenosis and subsequent mild hydrocephalus (due to the 11 mm stable cystic lesion in cerebral aqueduct---?congenitial?.  . Cataract    OU  . Dysmenorrhea    vicodin occ during first 2 days of cycle.  . Gluten intolerance    pt reports she underwent full GI w/u to r/o celiac dz  . Hepatic steatosis    ultrasound 08/2017. Hx of very mild elevation of ALT  . History of adenomatous polyp of colon 04/08/2019   recall Feb 2024  .  Hyperlipidemia, mixed   . Hypertensive retinopathy    OU  . Hypertensive retinopathy of both eyes   . Insomnia   . Iron deficiency 01/2019   Hb 11.3. Hemoccults neg x 3 03/19/19. EGD and colonoscopy 04/08/19 showed NO cause for IDA.  Pt does have menorrhagia, though, so she'll see her GYN.  started FeSO4 325 qd approx 04/14/19.  . Menorrhagia    resulting in [place] 2021  . Proliferative diabetic retinopathy of both eyes ([place])    steroid injections 10/2017--improved  . Sensorineural hearing loss of left ear    Sudden left hearing loss summer 2016--no improvement with steroids 01/2015 so brain MRI done by Dr. Redmond Baseman and it showed brain tumor that was determined to be benign.  Pt's hearing not bad enough for hearing aid as of 06/2016.  . Type 2 diabetes with complication (HCC)    +microalbuminuria, diab retpthy, diabetic gastroparesis (gastric emptying study mildly abnl 03/2017).  Recommended lantus 08/2018 but pt declined. Mild microalbuminuria.  Marland Kitchen Uterine fibroid   . White coat hypertension    Past Surgical History:  Procedure Laterality Date  . ANOSCOPY  05/12/2019   Procedure: normal exam, minimal hemorrhoid disease. Hyertrophied anal papila, benign appearing, posterior midline. Surgeon: Leighton Ruff MD  . CHOLECYSTECTOMY  2000  . COLONOSCOPY  04/08/2019   5 adenomas, recall 3 yrs; no cause for IDA found.  Hypertrophied anal papillae->bx showed low grade dysplasia; GI referred her to colorectal surgeon.  . ESOPHAGOGASTRODUODENOSCOPY  04/08/2019   mild chronic reactive gastritis. H pylori NEG.  No cause for IDA found.  . GAS INSERTION Right 10/31/2018   Procedure: Insertion Of C3F8 Gas;  Surgeon: Bernarda Caffey, MD;  Location: [facility];  Service: Ophthalmology;  Laterality: Right;  . GASTRIC EMPTYING SCAN  04/20/2017   Mildly abnormal, particularly the 1st hour of emptying.  Marland Kitchen MEMBRANE PEEL Right 10/31/2018   Procedure: MEMBRANE PEEL;  Surgeon: Bernarda Caffey, MD;  Location: [facility];  Service:  Ophthalmology;  Laterality: Right;  . PARS PLANA VITRECTOMY Right 04/12/2018   Procedure: Right PARS PLANA VITRECTOMY WITH 25 GAUGE with intravitreal antibiotics;  Surgeon: Bernarda Caffey, MD;  Location: [facility];  Service: Ophthalmology;  Laterality: Right;  . PARS PLANA VITRECTOMY Right 10/31/2018   Procedure: PARS PLANA VITRECTOMY WITH 25 GAUGE;  Surgeon: Bernarda Caffey, MD;  Location: [facility];  Service: Ophthalmology;  Laterality: Right;  . PHOTOCOAGULATION WITH LASER Right 10/31/2018   Procedure: Photocoagulation With Laser;  Surgeon: Bernarda Caffey, MD;  Location: [facility];  Service: Ophthalmology;  Laterality: Right;    FAMILY HISTORY Family History  Problem Relation Age of Onset  . Brain cancer Mother   . Diabetes Father   . Diabetes Maternal Grandmother   . Cataracts Maternal Grandmother   . Cervical cancer Paternal Grandmother   . Colon cancer Maternal Grandfather  62  . Amblyopia Neg Hx   . Blindness Neg Hx   . Glaucoma Neg Hx   . Macular degeneration Neg Hx   . Retinal detachment Neg Hx   . Strabismus Neg Hx   . Retinitis pigmentosa Neg Hx   . Esophageal cancer Neg Hx   . Stomach cancer Neg Hx   . Rectal cancer Neg Hx     SOCIAL HISTORY Social History   Tobacco Use  . Smoking status: Never Smoker  . Smokeless tobacco: Never Used  Vaping Use  . Vaping Use: Never used  Substance Use Topics  . Alcohol use: No  . Drug use: No         OPHTHALMIC EXAM:  Base Eye Exam    Visual Acuity (Snellen - Linear)      Right Left   Dist [place] 20/25 -2 20/30 -2   Dist ph [place] NI NI   Correction: Glasses       Tonometry (Tonopen, 2:45 PM)      Right Left   Pressure 13 13       Pupils      Dark Light Shape React APD   Right 3 2 Round Brisk 0   Left 3 2 Round Brisk 0       Visual Fields      Left Right    Full Full       Extraocular Movement      Right Left    Full Full       Neuro/Psych    Oriented x3: Yes   Mood/Affect: Normal       Dilation    Both eyes: 1.0%  Mydriacyl, 2.5% Phenylephrine @ 2:45 PM        Slit Lamp and Fundus Exam    Slit Lamp Exam      Right Left   Lids/Lashes Dermatochalasis - upper lid, mild Meibomian gland dysfunction, Telangiectasia Dermatochalasis - upper lid, Meibomian gland dysfunction, Telangiectasia   Conjunctiva/Sclera White and quiet, sutures intact White and quiet   Cornea Clear, trace Punctate epithelial erosions, well healed temporal cataract wounds Trace Punctate epithelial erosions   Anterior Chamber Deep and quiet Deep and quiet   Iris Round and dilated, No NVI Round and dilated, No NVI   Lens PC IOL in good position, 1-2+ Posterior capsular opacification, PC folds 2-3+ Nuclear sclerosis, 2-3+ Cortical cataract   Vitreous post vitrectomy, trace pigment Vitreous syneresis, blood stained vitreous condensations improving centrally and settling inferiorly, residual blood clots inferiorly - improving, persistent blood stained floater just IN to disc - improving       Fundus Exam      Right Left   Disc mild Pallor, Sharp rim, temporal PPA Pink and sharp, Compact, PPA   C/D Ratio 0.2 0.0   Macula Flat, blunted foveal reflex, mild cystic changes ST to fovea -- improved, persistent punctate exudate, scattered MA ST mac Flat, good foveal reflex, trace cystic changes, scattered MA; trace ERM, light focal laser changes, punctate exudates ST macula--improved   Vessels attenuated, Tortuous attenuated, mild Tortuousity   Periphery Attached, scattered DBH greatest superiorly, 360 PRP, good laser fill in 360 attached, scattered IRH; 360 PRP scars -- with good early fill in changes, inferior periphery obscured by persistent VH        Refraction    Wearing Rx      Sphere Cylinder   Right -3.75 Sphere   Left -3.75 Sphere  IMAGING AND PROCEDURES  Imaging and Procedures for _0 @  OCT, Retina - OU - Both Eyes       Right Eye Quality was good. Central Foveal Thickness: 318. Progression has been stable.  Findings include abnormal foveal contour, epiretinal membrane, no SRF, intraretinal hyper-reflective material, intraretinal fluid (Persistent cystic changes temporal macula).   Left Eye Quality was good. Central Foveal Thickness: 286. Progression has improved. Findings include normal foveal contour, intraretinal fluid, no SRF, intraretinal hyper-reflective material (Persistent cystic changes).   Notes *Images captured and stored on drive  Diagnosis / Impression:  DME OU OD: Persistent cystic changes temporal macula OS: Persistent cystic changes  Clinical management:  See below  Abbreviations: NFP - Normal foveal profile. CME - cystoid macular edema. PED - pigment epithelial detachment. IRF - intraretinal fluid. SRF - subretinal fluid. EZ - ellipsoid zone. ERM - epiretinal membrane. ORA - outer retinal atrophy. ORT - outer retinal tubulation. SRHM - subretinal hyper-reflective material         Intravitreal Injection, Pharmacologic Agent - OD - Right Eye       Time Out 04/14/2020. 3:08 PM. Confirmed correct patient, procedure, site, and patient consented.   Anesthesia Topical anesthesia was used. Anesthetic medications included Lidocaine 2%, Proparacaine 0.5%.   Procedure Preparation included 5% betadine to ocular surface, eyelid speculum. A (32g) needle was used.   Injection:  2 mg aflibercept Alfonse Flavors) SOLN   NDC: A3590391, Lot: [id number], Expiration date: 07/27/2020   Route: Intravitreal, Site: Right Eye, Waste: 0.05 mL  Post-op Post injection exam found visual acuity of at least counting fingers. The patient tolerated the procedure well. There were no complications. The patient received written and verbal post procedure care education. Post injection medications were not given.                ASSESSMENT/PLAN:    ICD-10-CM   1. Proliferative diabetic retinopathy of both eyes with macular edema associated with type 2 diabetes mellitus (HCC)  M38.4665 Intravitreal  Injection, Pharmacologic Agent - OD - Right Eye    aflibercept (EYLEA) SOLN 2 mg  2. Retinal edema  H35.81 OCT, Retina - OU - Both Eyes  3. Vitreous hemorrhage of left eye (HCC)  H43.12   4. Right endophthalmia  H44.001   5. Vitreous hemorrhage, right eye ([place])  H43.11   6. Essential hypertension  I10   7. Hypertensive retinopathy of both eyes  H35.033   8. Combined forms of age-related cataract of left eye  H25.812   9. Pseudophakia of right eye  Z96.1    1,2.  Proliferative diabetic retinopathy w/ DME, OU  - HbA1c 11.0% (11.22.21), 8.3% (03.31.21, 12.2.20), 8.6% (06.30.20), 8.4% (02.14.20)  - s/p IVA OD #1 9.20.19, #2 (10.25.19), #3 (11.15.19), #4 (12.17.19), #5 (01.14.20), #6 (2.11.20), #7 (05.29.20), #8 (08.19.20), #9 (10.30.20), #10 (12.09.20), #11 (01.11.21), #12 (02.15.21), #13 (03.23.21), #14 (04.20.21), #15 (06.08.21), #16 (07.06.21)  - s/p IVA OS #1 9.27.19, #2 (10.25.19), #3 (11.15.19), #4 (12.17.19), #5 (01.14.20), #6 (2.11.20), #7 (04.26.20), #8 (05.29.20), #9 (06.26.20), #10 (08.05.20), #11 (11.11.20), #12 (12.09.20), #13 (01.11.21), #14 (02.15.21), #15 (03.23.21), #16 (04.20.21), #17 (06.08.21) -- IVA resistance, #18 (09.27.21)  - s/p IVE OD #1 (08.03.21) -- sample, #2 (09.10.21), #3 (10.08.21), #4 (11.23.21), #5 (12.21.21), #6 (01.19.22) sample  - s/p IVE OS #1 (10.25.21), #2 (11.23.21), #3 (12.21.21)  - S/P PRP OS (09.20.19), (5.19.20), (08.19.20), (04.28.21), (02.02.22)  - S/P PRP OD (9.27.19 and 11.21.19), fill-in (04.14.20) (09.03.20, surgery)  - S/P focal laser OS (  07.06.21)  - FA (9.20.19) shows +NVE OU and leaking MA and capillary nonperfusion  - repeat FA 11.15.19 shows NV regressing OU  - pre-op: OD w/ VA stable at 20/25, but there is some preretinal fibrosis / tractional membranes just superior to disc and mild central DME  - s/p 25g PPV+MP+10% C3F8 gas OD (09.03.20) -- ERM/PRF removal OD  - BCVA stable at 20/25 OD              - fibrosis/ERM stably improved; retina  attached  - OCT shows OD: Persistent cystic changes temporal macula; OS: Persistent cystic changes  - recommend IVE OD #7 today, 02.16.22  - pt wishes to proceed with injection  - RBA of procedure discussed, questions answered  - informed consent obtained   - see procedure note  - Avastin informed consent form signed and scanned on 01.11.2021 (OU)  - Eylea informed consent form signed and scanned on 08.03.2021  - [email or web address] benefits investigation started, 08.03.21 -- approved as of 09.10.21 -- 2022 insurance verified as of 02.16.22  - f/u 5 weeks, DFE, OCT, possible inj.  3. Vitreous hemorrhage OS -- recurrent, improving  - recurrent VH, onset 09.22.21  - etiology: secondary to PDR as described above (no RT/RD on exam)  - s/p PRP OS (9.20.19), (05.19.20), (08.19.20), (04.28.21)  - s/p PRP fill in (02.02.22)  - s/p IVA OS on 4.26.20, 5.29.20, 6.26.20, 8.5.20,11.11.20, 12.06.20 and so on as above  - VH clear centrally and settled inferiorly  - BCVA slighlty decreased to 20/30 from 20/25  - completed Lotemax SM QID x7 days  - f/u 5 weeks DFE, OCT, possible injection  4. History of Endophthalmitis OD  - s/p IVA OU 04/09/2018  - s/p 25g PPV w/ intravitreal vanc, ceftaz and cefepime OD, 2.14.2020  - s/p intravitreal tap / vanc and ceflaz injections (02.16.20)             - gram stain (2.14.20) shows G+ cocci, WBCs mostly PMNs;   - repeat gram stain from t/i (2.16.20) -- no organisms, just WBCs             - cultures from vitreous grew rare Staph warneri; cultures from t/i -- no growth             - doing well, BCVA 20/25             - inflammation/posterior debris resolved             - IOP 13 off Brimonidine  - completed po pred taper -- caused significant elevations in BG  - monitor  5. History of Vitreous Hemorrhage OD -- cleared from PPV x2 for endophthalmitis and ERM/preretinal fibrosis  - secondary to PDR as described below  - S/P IVA OD #1 (09.20.19), #2 (10.25.19), #3 (11.15.19),  #4 (12.16.19), #5 (03/12/2018)  - S/P PRP OD #1 (09.27.19), fill in OD (11.21.19) -- each somewhat limited inferiorly by residual VH  6,7. Hypertensive retinopathy OU  - BP elevated today (9.27.21) -- 164/91, left arm  - discussed importance of tight BP control.  - monitor  8. Combined form age related cataract OS-   - The symptoms of cataract, surgical options, and treatments and risks were discussed with patient.  - discussed diagnosis and progression  9. Pseudophakia OD  - s/p CE/IOL OD (Dr. Zenia Resides, 12.11.20)  - beautiful surgeries, doing well  Ophthalmic Meds Ordered this visit:  Meds ordered this encounter  Medications  . aflibercept (EYLEA) SOLN 2  mg      Return in about 5 weeks (around 05/19/2020) for f/u PDR OU, DFE, OCT.  There are no Patient Instructions on file for this visit.  This document serves as a record of services personally performed by Gardiner Sleeper, MD, PhD. It was created on their behalf by Roselee Nova, COMT. The creation of this record is the provider's dictation and/or activities during the visit.  Electronically signed by: Roselee Nova, COMT 04/14/20 4:16 PM   This document serves as a record of services personally performed by Gardiner Sleeper, MD, PhD. It was created on their behalf by San Jetty. Owens Shark, OA an ophthalmic technician. The creation of this record is the provider's dictation and/or activities during the visit.    Electronically signed by: San Jetty. Owens Shark, [place] 02.16.2022 4:16 PM  Gardiner Sleeper, M.D., Ph.D. Diseases & Surgery of the Retina and Vitreous [facility]  I have reviewed the above documentation for accuracy and completeness, and I agree with the above. Gardiner Sleeper, M.D., Ph.D. 04/14/20 4:16 PM  Abbreviations: M myopia (nearsighted); A astigmatism; H hyperopia (farsighted); P presbyopia; Mrx spectacle prescription;  CTL contact lenses; OD right eye; OS left eye; OU both eyes  XT exotropia; ET  esotropia; PEK punctate epithelial keratitis; PEE punctate epithelial erosions; DES dry eye syndrome; MGD meibomian gland dysfunction; ATs artificial tears; PFAT's preservative free artificial tears; [place] nuclear sclerotic cataract; PSC posterior subcapsular cataract; ERM epi-retinal membrane; PVD posterior vitreous detachment; RD retinal detachment; DM diabetes mellitus; DR diabetic retinopathy; NPDR non-proliferative diabetic retinopathy; PDR proliferative diabetic retinopathy; CSME clinically significant macular edema; DME diabetic macular edema; dbh dot blot hemorrhages; CWS cotton wool spot; POAG primary open angle glaucoma; C/D cup-to-disc ratio; HVF humphrey visual field; GVF goldmann visual field; OCT optical coherence tomography; IOP intraocular pressure; BRVO Branch retinal vein occlusion; CRVO central retinal vein occlusion; CRAO central retinal artery occlusion; BRAO branch retinal artery occlusion; RT retinal tear; SB scleral buckle; PPV pars plana vitrectomy; VH Vitreous hemorrhage; PRP panretinal laser photocoagulation; IVK intravitreal kenalog; VMT vitreomacular traction; MH Macular hole;  NVD neovascularization of the disc; NVE neovascularization elsewhere; AREDS age related eye disease study; ARMD age related macular degeneration; POAG primary open angle glaucoma; EBMD epithelial/anterior basement membrane dystrophy; ACIOL anterior chamber intraocular lens; IOL intraocular lens; PCIOL posterior chamber intraocular lens; Phaco/IOL phacoemulsification with intraocular lens placement; [place] photorefractive keratectomy; LASIK laser assisted in situ keratomileusis; HTN hypertension; DM diabetes mellitus; COPD chronic obstructive pulmonary disease

## 2020-04-14 ENCOUNTER — Ambulatory Visit (INDEPENDENT_AMBULATORY_CARE_PROVIDER_SITE_OTHER): Payer: 59 | Admitting: Ophthalmology

## 2020-04-14 ENCOUNTER — Other Ambulatory Visit: Payer: Self-pay

## 2020-04-14 ENCOUNTER — Encounter (INDEPENDENT_AMBULATORY_CARE_PROVIDER_SITE_OTHER): Payer: Self-pay | Admitting: Ophthalmology

## 2020-04-14 DIAGNOSIS — H4311 Vitreous hemorrhage, right eye: Secondary | ICD-10-CM | POA: Diagnosis not present

## 2020-04-14 DIAGNOSIS — H35033 Hypertensive retinopathy, bilateral: Secondary | ICD-10-CM

## 2020-04-14 DIAGNOSIS — H4312 Vitreous hemorrhage, left eye: Secondary | ICD-10-CM

## 2020-04-14 DIAGNOSIS — H3581 Retinal edema: Secondary | ICD-10-CM | POA: Diagnosis not present

## 2020-04-14 DIAGNOSIS — E113513 Type 2 diabetes mellitus with proliferative diabetic retinopathy with macular edema, bilateral: Secondary | ICD-10-CM | POA: Diagnosis not present

## 2020-04-14 DIAGNOSIS — I10 Essential (primary) hypertension: Secondary | ICD-10-CM

## 2020-04-14 DIAGNOSIS — H44001 Unspecified purulent endophthalmitis, right eye: Secondary | ICD-10-CM

## 2020-04-14 DIAGNOSIS — Z961 Presence of intraocular lens: Secondary | ICD-10-CM

## 2020-04-14 DIAGNOSIS — H25812 Combined forms of age-related cataract, left eye: Secondary | ICD-10-CM

## 2020-04-14 MED ORDER — AFLIBERCEPT 2MG/0.05ML IZ SOLN FOR KALEIDOSCOPE
2.0000 mg | INTRAVITREAL | Status: AC | PRN
  Administered 2020-04-14: 2 mg via INTRAVITREAL

## 2020-04-20 ENCOUNTER — Ambulatory Visit: Payer: BLUE CROSS/BLUE SHIELD

## 2020-04-21 ENCOUNTER — Other Ambulatory Visit: Payer: Self-pay

## 2020-04-22 ENCOUNTER — Ambulatory Visit (INDEPENDENT_AMBULATORY_CARE_PROVIDER_SITE_OTHER): Payer: 59 | Admitting: Family Medicine

## 2020-04-22 ENCOUNTER — Other Ambulatory Visit: Payer: Self-pay | Admitting: Family Medicine

## 2020-04-22 ENCOUNTER — Encounter: Payer: Self-pay | Admitting: Family Medicine

## 2020-04-22 VITALS — BP 140/84 | HR 82 | Temp 97.3°F | Resp 16 | Ht 63.0 in | Wt 176.2 lb

## 2020-04-22 DIAGNOSIS — D330 Benign neoplasm of brain, supratentorial: Secondary | ICD-10-CM

## 2020-04-22 DIAGNOSIS — E7800 Pure hypercholesterolemia, unspecified: Secondary | ICD-10-CM

## 2020-04-22 DIAGNOSIS — E1143 Type 2 diabetes mellitus with diabetic autonomic (poly)neuropathy: Secondary | ICD-10-CM

## 2020-04-22 DIAGNOSIS — K3184 Gastroparesis: Secondary | ICD-10-CM

## 2020-04-22 DIAGNOSIS — I10 Essential (primary) hypertension: Secondary | ICD-10-CM | POA: Diagnosis not present

## 2020-04-22 DIAGNOSIS — Z794 Long term (current) use of insulin: Secondary | ICD-10-CM | POA: Diagnosis not present

## 2020-04-22 DIAGNOSIS — R7401 Elevation of levels of liver transaminase levels: Secondary | ICD-10-CM | POA: Diagnosis not present

## 2020-04-22 DIAGNOSIS — E11319 Type 2 diabetes mellitus with unspecified diabetic retinopathy without macular edema: Secondary | ICD-10-CM

## 2020-04-22 DIAGNOSIS — R140 Abdominal distension (gaseous): Secondary | ICD-10-CM

## 2020-04-22 LAB — LIPID PANEL
Cholesterol: 141 mg/dL (ref 0–200)
HDL: 43.2 mg/dL (ref >39.00)
LDL Cholesterol: 58 mg/dL (ref 0–99)
NonHDL: 97.43
Total CHOL/HDL Ratio: 3
Triglycerides: 195 mg/dL — ABNORMAL HIGH (ref 0.0–149.0)
VLDL: 39 mg/dL (ref 0.0–40.0)

## 2020-04-22 LAB — COMPREHENSIVE METABOLIC PANEL
ALT: 70 U/L — ABNORMAL HIGH (ref 0–35)
AST: 42 U/L — ABNORMAL HIGH (ref 0–37)
Albumin: 4 g/dL (ref 3.5–5.2)
Alkaline Phosphatase: 62 U/L (ref 39–117)
BUN: 19 mg/dL (ref 6–23)
CO2: 28 mEq/L (ref 19–32)
Calcium: 9.5 mg/dL (ref 8.4–10.5)
Chloride: 104 mEq/L (ref 96–112)
Creatinine, Ser: 0.53 mg/dL (ref 0.40–1.20)
GFR: 107.08 mL/min (ref >60.00)
Glucose, Bld: 151 mg/dL — ABNORMAL HIGH (ref 70–99)
Potassium: 4.9 mEq/L (ref 3.5–5.1)
Sodium: 139 mEq/L (ref 135–145)
Total Bilirubin: 0.3 mg/dL (ref 0.2–1.2)
Total Protein: 6.4 g/dL (ref 6.0–8.3)

## 2020-04-22 LAB — HEMOGLOBIN A1C: Hgb A1c MFr Bld: 9.6 % — ABNORMAL HIGH (ref 4.6–6.5)

## 2020-04-22 LAB — MICROALBUMIN / CREATININE URINE RATIO
Creatinine,U: 91.7 mg/dL
Microalb Creat Ratio: 325 mg/g — ABNORMAL HIGH (ref 0.0–30.0)
Microalb, Ur: 297.9 mg/dL — ABNORMAL HIGH (ref 0.0–1.9)

## 2020-04-22 MED ORDER — VERAPAMIL HCL ER 120 MG PO TBCR: 120.0000 mg | EXTENDED_RELEASE_TABLET | Freq: Every day | ORAL | 3 refills | Status: DC

## 2020-04-22 MED ORDER — METFORMIN HCL 1000 MG PO TABS: 1000.0000 mg | ORAL_TABLET | Freq: Two times a day (BID) | ORAL | 3 refills | Status: DC

## 2020-04-22 NOTE — Progress Notes (Signed)
OFFICE VISIT  04/22/2020  CC:  Chief Complaint  Patient presents with  . Follow-up    RCI, 2 mo. Pt is fasting   HPI:    Patient is a 50 y.o. Caucasian female who presents for 2 mo f/u DM, HTN, HLD, diab gastroparesis. A/P as of last visit: "1) DM, poor control, with diabetic gastroparesis "flare": She is improving overall on reglan qid and with getting on lantus and titrating this up gradually. Continue with this plan. Next a1c in 2 mo.  2) HTN, better control. Will add amlodipine 5mg  qd. Cont toprol xl 200 mg qd and losartan 100 mg qd.  3) HLD: tolerating rosuva 10mg  every other day. Hopefully she'll come fasting next f/u visit in 2 mo and I'll then be able to recheck lipid panel.  INTERIM HX: She complained of feeling fluid retention in abd region/wt gain of 9 lb after last visit so I rx'd lasix 20mg  bid prn on 03/12/20 and recommended she contact her GI MD for f/u appt. Lost some wt and feels better in abd and legs after taking the lasix for a week.  Has appt with GI in about a month. From GI standpoint says all is about the same: constant feeling of abd bloating that gets worse with eating.  Eats small portions.  No longer having any signif prob with postprand nauasea.  Has some intermittent constipation for which she takes no meds.  No diarrhea.  DM: lantus is at 50 U now, some definite periods of unexplained hyperglyc but back to 140s or so fasting last few days.  Also janumet and victoza currently.  HTN: home avg 135/80, HR usually 90s.  Amlod 5mg , losartan 100, and toprol xl 100 bid.  HLD: tolerating rosuva 10mg  every other day.  ROS: no fevers, no CP, no SOB, no wheezing, no cough, no dizziness, no HAs, no rashes, no melena/hematochezia.  No polyuria or polydipsia.  No myalgias or arthralgias.  No focal weakness, paresthesias, or tremors.  No acute vision or hearing abnormalities.   No palpitations.      Past Medical History:  Diagnosis Date  . Abdominal bloating     likely from diab gastroparesis.  Dr. Havery Moros started trial of reglan 03/2019.  Marland Kitchen Benign brain tumor ([facility])    Cystic lesion in cerebral aqueduct region with mild hydrocephalus-- stable MRI 02/2016.  Surveillance MRI 05/2017 --dilated cerebral aqueduct related to aqueductal stenosis and subsequent mild hydrocephalus (due to the 11 mm stable cystic lesion in cerebral aqueduct---?congenitial?.  . Cataract    OU  . Dysmenorrhea    vicodin occ during first 2 days of cycle.  . Gluten intolerance    pt reports she underwent full GI w/u to r/o celiac dz  . Hepatic steatosis    ultrasound 08/2017. Hx of very mild elevation of ALT  . History of adenomatous polyp of colon 04/08/2019   recall Feb 2024  . Hyperlipidemia, mixed   . Hypertensive retinopathy    OU  . Hypertensive retinopathy of both eyes   . Insomnia   . Iron deficiency 01/2019   Hb 11.3. Hemoccults neg x 3 03/19/19. EGD and colonoscopy 04/08/19 showed NO cause for IDA.  Pt does have menorrhagia, though, so she'll see her GYN.  started FeSO4 325 qd approx 04/14/19.  . Menorrhagia    resulting in [place] 2021  . Proliferative diabetic retinopathy of both eyes ([place])    steroid injections 10/2017--improved  . Sensorineural hearing loss of left ear  Sudden left hearing loss summer 2016--no improvement with steroids 01/2015 so brain MRI done by Dr. Redmond Baseman and it showed brain tumor that was determined to be benign.  Pt's hearing not bad enough for hearing aid as of 06/2016.  . Type 2 diabetes with complication ([facility])    +microalbuminuria, diab retpthy, diabetic gastroparesis (gastric emptying study mildly abnl 03/2017).  Recommended lantus 08/2018 but pt declined. Mild microalbuminuria.  Marland Kitchen Uterine fibroid   . White coat hypertension     Past Surgical History:  Procedure Laterality Date  . ANOSCOPY  05/12/2019   Procedure: normal exam, minimal hemorrhoid disease. Hyertrophied anal papila, benign appearing, posterior midline. Surgeon: Leighton Ruff MD  . CHOLECYSTECTOMY  2000  . COLONOSCOPY  04/08/2019   5 adenomas, recall 3 yrs; no cause for IDA found.  Hypertrophied anal papillae->bx showed low grade dysplasia; GI referred her to colorectal surgeon.  . ESOPHAGOGASTRODUODENOSCOPY  04/08/2019   mild chronic reactive gastritis. H pylori NEG.  No cause for IDA found.  . GAS INSERTION Right 10/31/2018   Procedure: Insertion Of C3F8 Gas;  Surgeon: Bernarda Caffey, MD;  Location: [facility];  Service: Ophthalmology;  Laterality: Right;  . GASTRIC EMPTYING SCAN  04/20/2017   Mildly abnormal, particularly the 1st hour of emptying.  Marland Kitchen MEMBRANE PEEL Right 10/31/2018   Procedure: MEMBRANE PEEL;  Surgeon: Bernarda Caffey, MD;  Location: [place];  Service: Ophthalmology;  Laterality: Right;  . PARS PLANA VITRECTOMY Right 04/12/2018   Procedure: Right PARS PLANA VITRECTOMY WITH 25 GAUGE with intravitreal antibiotics;  Surgeon: Bernarda Caffey, MD;  Location: [place];  Service: Ophthalmology;  Laterality: Right;  . PARS PLANA VITRECTOMY Right 10/31/2018   Procedure: PARS PLANA VITRECTOMY WITH 25 GAUGE;  Surgeon: Bernarda Caffey, MD;  Location: [place];  Service: Ophthalmology;  Laterality: Right;  . PHOTOCOAGULATION WITH LASER Right 10/31/2018   Procedure: Photocoagulation With Laser;  Surgeon: Bernarda Caffey, MD;  Location: [place];  Service: Ophthalmology;  Laterality: Right;    Outpatient Medications Prior to Visit  Medication Sig Dispense Refill  . amLODipine (NORVASC) 5 MG tablet Take 1 tablet (5 mg total) by mouth daily. 30 tablet 2  . HYDROcodone-acetaminophen (NORCO/VICODIN) 5-325 MG tablet Take 1-2 tablets by mouth every 6 (six) hours as needed for moderate pain. 40 tablet 0  . insulin glargine (LANTUS SOLOSTAR) 100 UNIT/ML Solostar Pen 40 Units SQ qhs, patient will be gradually titrating this dose to get to target fasting glucose range of 100-110 15 mL 6  . losartan (COZAAR) 100 MG tablet Take 1 tablet (100 mg total) by mouth daily. 90 tablet 0  . metoCLOPramide  (REGLAN) 5 MG tablet Take 1 tablet (5 mg total) by mouth 4 (four) times daily -  before meals and at bedtime. 120 tablet 1  . metoprolol succinate (TOPROL-XL) 100 MG 24 hr tablet 2 tabs po qd 60 tablet 6  . Multiple Vitamin (MULTIVITAMIN WITH MINERALS) TABS tablet Take 1 tablet by mouth daily.    Marland Kitchen OVER THE COUNTER MEDICATION Take 2 capsules by mouth daily. Neu Remedy    . pantoprazole (PROTONIX) 40 MG tablet Take 1 tablet by mouth once daily 90 tablet 0  . promethazine (PHENERGAN) 12.5 MG tablet 1-2 tabs po q6h prn nausea 30 tablet 1  . rosuvastatin (CRESTOR) 10 MG tablet Take 1 tablet (10 mg total) by mouth daily. (Patient taking differently: Take 10 mg by mouth every other day.) 30 tablet 4  . VICTOZA 18 MG/3ML SOPN INJECT 0.6MG  INTO THE SKIN  DAILY FOR 1 WEEK THEN INCREASE TO INJECT 1.2MG  DAILY 6 mL 0  . JANUMET 50-1000 MG tablet TAKE 1 TABLET BY MOUTH TWICE DAILY WITH A MEAL 180 tablet 0  . furosemide (LASIX) 20 MG tablet Take 1 tablet (20 mg total) by mouth 2 (two) times daily. (Patient not taking: Reported on 04/22/2020) 15 tablet 1   No facility-administered medications prior to visit.    Allergies  Allergen Reactions  . Gluten Meal Swelling  . Lisinopril Cough  . Pioglitazone Other (See Comments)    ELEVATED glucoses + worse chronic nausea    ROS As per HPI  PE: Vitals with BMI 04/22/2020 02/26/2020 01/19/2020  Height 5\' 3"  5\' 3"  5\' 3"   Weight 176 lbs 3 oz 173 lbs 10 oz 169 lbs  BMI 31.22 19.14 78.29  Systolic 562 130 865  Diastolic 84 85 84  Pulse 82 93 94     Gen: Alert, well appearing.  Patient is oriented to person, place, time, and situation. AFFECT: pleasant, lucid thought and speech. CV: RRR, no m/r/g.   LUNGS: CTA bilat, nonlabored resps, good aeration in all lung fields. EXT: no clubbing or cyanosis.  No pitting edema but slight doughy nonpitting edema, L>R LL/ankle (chronic).   LABS:  Lab Results  Component Value Date   TSH 1.37 06/23/2019   Lab Results   Component Value Date   WBC 7.0 01/19/2020   HGB 12.3 01/19/2020   HCT 37.4 01/19/2020   MCV 86.6 01/19/2020   PLT 429.0 (H) 01/19/2020   Lab Results  Component Value Date   IRON 54 01/19/2020   TIBC 383 01/19/2020   FERRITIN 52 01/19/2020    Lab Results  Component Value Date   CREATININE 0.53 01/19/2020   BUN 13 01/19/2020   NA 137 01/19/2020   K 4.4 01/19/2020   CL 100 01/19/2020   CO2 28 01/19/2020   Lab Results  Component Value Date   ALT 47 (H) 01/19/2020   AST 39 (H) 01/19/2020   ALKPHOS 75 01/19/2020   BILITOT 0.3 01/19/2020   Lab Results  Component Value Date   CHOL 99 01/29/2019   Lab Results  Component Value Date   HDL 33 (L) 01/29/2019   Lab Results  Component Value Date   LDLCALC 39 01/29/2019   Lab Results  Component Value Date   TRIG 196 (H) 01/29/2019   Lab Results  Component Value Date   CHOLHDL 3.0 01/29/2019   Lab Results  Component Value Date   HGBA1C 11.0 (H) 01/19/2020   IMPRESSION AND PLAN:  1) DM 2. Cont to titrate lantus to get fasting gluc consistently 100-110 range. Change janumet to metformin since she's on victoza and januvia no longer needed (eRx'd metformin 1000 mg bid today).  Continue victoza qd. Hba1c and urine microalb/cr today  2) HTN: stable. Cont toprol xl 100mg  bid, losartan 100mg  qd, and amlodipine 5mg  qd. Lytes/cr today.  3) HLD: tolerating rosova 10mg  qod. FLP and hepatic panel today.  4) Diabetic gastroparesis. Some improvement with reglan.  Has GI f/u in about a month. Some "fluid retention" in abd may have been amlodipine effect. Got better with brief course of lasix (mild LE swelling improved with this as well).    An After Visit Summary was printed and given to the patient.  FOLLOW UP: Return in about 3 months (around 07/20/2020) for routine chronic illness f/u. Next cpe 4-6 mo  Signed:  Crissie Sickles, MD  04/22/2020    

## 2020-04-24 ENCOUNTER — Other Ambulatory Visit: Payer: Self-pay | Admitting: Family Medicine

## 2020-05-11 ENCOUNTER — Other Ambulatory Visit: Payer: Self-pay | Admitting: Family Medicine

## 2020-05-11 DIAGNOSIS — E118 Type 2 diabetes mellitus with unspecified complications: Secondary | ICD-10-CM

## 2020-05-17 NOTE — Progress Notes (Signed)
**Note Gloria-Identified via Obfuscation** [facility] Note  05/18/2020     CHIEF COMPLAINT Patient presents for Retina Follow Up   HISTORY OF PRESENT ILLNESS: Gloria Gomez is a 50 y.o. female who presents to the clinic today for:   HPI    Retina Follow Up    Patient presents with  Diabetic Retinopathy.  In both eyes.  This started weeks ago.  Severity is moderate.  Duration of weeks.  Since onset it is stable.  I, the attending physician,  performed the HPI with the patient and updated documentation appropriately.          Comments    Pt states vision seems a little worse OD and still sees "shadows" OS.  Pt denies eye pain or discomfort.  Pt denies any new or worsening floaters or fol OU.       Last edited by Bernarda Caffey, MD on 05/18/2020  9:33 PM. (History)     Referring physician:   HISTORICAL INFORMATION:   Selected notes from the MEDICAL RECORD NUMBER Referred for DM exam   CURRENT MEDICATIONS: No current outpatient medications on file. (Ophthalmic Drugs)   No current facility-administered medications for this visit. (Ophthalmic Drugs)   Current Outpatient Medications (Other)  Medication Sig  . amLODipine (NORVASC) 5 MG tablet Take 1 tablet (5 mg total) by mouth daily.  Marland Kitchen HYDROcodone-acetaminophen (NORCO/VICODIN) 5-325 MG tablet Take 1-2 tablets by mouth every 6 (six) hours as needed for moderate pain.  Marland Kitchen insulin glargine (LANTUS SOLOSTAR) 100 UNIT/ML Solostar Pen 40 Units SQ qhs, patient will be gradually titrating this dose to get to target fasting glucose range of 100-110  . losartan (COZAAR) 100 MG tablet Take 1 tablet by mouth once daily  . metFORMIN (GLUCOPHAGE) 1000 MG tablet Take 1 tablet (1,000 mg total) by mouth 2 (two) times daily with a meal.  . metoCLOPramide (REGLAN) 5 MG tablet Take 1 tablet (5 mg total) by mouth 4 (four) times daily -  before meals and at bedtime.  . metoprolol succinate (TOPROL-XL) 100 MG 24 hr tablet 2 tabs po qd  . Multiple Vitamin  (MULTIVITAMIN WITH MINERALS) TABS tablet Take 1 tablet by mouth daily.  Marland Kitchen OVER THE COUNTER MEDICATION Take 2 capsules by mouth daily. Neu Remedy  . pantoprazole (PROTONIX) 40 MG tablet Take 1 tablet by mouth once daily  . promethazine (PHENERGAN) 12.5 MG tablet 1-2 tabs po q6h prn nausea  . rosuvastatin (CRESTOR) 10 MG tablet Take 1 tablet (10 mg total) by mouth daily. (Patient taking differently: Take 10 mg by mouth every other day.)  . verapamil (CALAN-SR) 120 MG CR tablet Take 1 tablet (120 mg total) by mouth at bedtime.  Marland Kitchen VICTOZA 18 MG/3ML SOPN INJECT 0.6MG INTO THE SKIN DAILY FOR 1 WEEK, THEN INCREASE TO INJECT 1.2MG DAILY   No current facility-administered medications for this visit. (Other)      REVIEW OF SYSTEMS: ROS    Negative for: Constitutional, Gastrointestinal, Neurological, Skin, Genitourinary, Musculoskeletal, HENT, Endocrine, Cardiovascular, Eyes, Respiratory, Psychiatric, Allergic/Imm, Heme/Lymph   Last edited by [name] on 05/18/2020  2:30 PM. (History)       ALLERGIES Allergies  Allergen Reactions  . Gluten Meal Swelling  . Lisinopril Cough  . Pioglitazone Other (See Comments)    ELEVATED glucoses + worse chronic nausea    PAST MEDICAL HISTORY Past Medical History:  Diagnosis Date  . Abdominal bloating    likely from diab gastroparesis.  Dr. Havery Moros started trial of reglan  03/2019.  Marland Kitchen Benign brain tumor ([place])    Cystic lesion in cerebral aqueduct region with mild hydrocephalus-- stable MRI 02/2016.  Surveillance MRI 05/2017 --dilated cerebral aqueduct related to aqueductal stenosis and subsequent mild hydrocephalus (due to the 11 mm stable cystic lesion in cerebral aqueduct---?congenitial?.  . Cataract    OU  . Dysmenorrhea    vicodin occ during first 2 days of cycle.  . Gluten intolerance    pt reports she underwent full GI w/u to r/o celiac dz  . Hepatic steatosis    ultrasound 08/2017. Hx of very mild elevation of ALT  . History of  adenomatous polyp of colon 04/08/2019   recall Feb 2024  . Hyperlipidemia, mixed   . Hypertensive retinopathy    OU  . Hypertensive retinopathy of both eyes   . Insomnia   . Iron deficiency 01/2019   Hb 11.3. Hemoccults neg x 3 03/19/19. EGD and colonoscopy 04/08/19 showed NO cause for IDA.  Pt does have menorrhagia, though, so she'll see her GYN.  started FeSO4 325 qd approx 04/14/19.  . Menorrhagia    resulting in [place] 2021  . Proliferative diabetic retinopathy of both eyes ([place])    steroid injections 10/2017--improved  . Sensorineural hearing loss of left ear    Sudden left hearing loss summer 2016--no improvement with steroids 01/2015 so brain MRI done by Dr. Redmond Baseman and it showed brain tumor that was determined to be benign.  Pt's hearing not bad enough for hearing aid as of 06/2016.  . Type 2 diabetes with complication (HCC)    +microalbuminuria, diab retpthy, diabetic gastroparesis (gastric emptying study mildly abnl 03/2017).  Recommended lantus 08/2018 but pt declined. Mild microalbuminuria.  Marland Kitchen Uterine fibroid   . White coat hypertension    Past Surgical History:  Procedure Laterality Date  . ANOSCOPY  05/12/2019   Procedure: normal exam, minimal hemorrhoid disease. Hyertrophied anal papila, benign appearing, posterior midline. Surgeon: Leighton Ruff MD  . CHOLECYSTECTOMY  2000  . COLONOSCOPY  04/08/2019   5 adenomas, recall 3 yrs; no cause for IDA found.  Hypertrophied anal papillae->bx showed low grade dysplasia; GI referred her to colorectal surgeon.  . ESOPHAGOGASTRODUODENOSCOPY  04/08/2019   mild chronic reactive gastritis. H pylori NEG.  No cause for IDA found.  . GAS INSERTION Right 10/31/2018   Procedure: Insertion Of C3F8 Gas;  Surgeon: Bernarda Caffey, MD;  Location: [place];  Service: Ophthalmology;  Laterality: Right;  . GASTRIC EMPTYING SCAN  04/20/2017   Mildly abnormal, particularly the 1st hour of emptying.  Marland Kitchen MEMBRANE PEEL Right 10/31/2018   Procedure: MEMBRANE PEEL;   Surgeon: Bernarda Caffey, MD;  Location: [place];  Service: Ophthalmology;  Laterality: Right;  . PARS PLANA VITRECTOMY Right 04/12/2018   Procedure: Right PARS PLANA VITRECTOMY WITH 25 GAUGE with intravitreal antibiotics;  Surgeon: Bernarda Caffey, MD;  Location: [place];  Service: Ophthalmology;  Laterality: Right;  . PARS PLANA VITRECTOMY Right 10/31/2018   Procedure: PARS PLANA VITRECTOMY WITH 25 GAUGE;  Surgeon: Bernarda Caffey, MD;  Location: [place];  Service: Ophthalmology;  Laterality: Right;  . PHOTOCOAGULATION WITH LASER Right 10/31/2018   Procedure: Photocoagulation With Laser;  Surgeon: Bernarda Caffey, MD;  Location: [place];  Service: Ophthalmology;  Laterality: Right;    FAMILY HISTORY Family History  Problem Relation Age of Onset  . Brain cancer Mother   . Diabetes Father   . Diabetes Maternal Grandmother   . Cataracts Maternal Grandmother   . Cervical cancer Paternal Grandmother   .  Colon cancer Maternal Grandfather 50  . Amblyopia Neg Hx   . Blindness Neg Hx   . Glaucoma Neg Hx   . Macular degeneration Neg Hx   . Retinal detachment Neg Hx   . Strabismus Neg Hx   . Retinitis pigmentosa Neg Hx   . Esophageal cancer Neg Hx   . Stomach cancer Neg Hx   . Rectal cancer Neg Hx     SOCIAL HISTORY Social History   Tobacco Use  . Smoking status: Never Smoker  . Smokeless tobacco: Never Used  Vaping Use  . Vaping Use: Never used  Substance Use Topics  . Alcohol use: No  . Drug use: No         OPHTHALMIC EXAM:  Base Eye Exam    Visual Acuity (Snellen - Linear)      Right Left   Dist [place] 20/30 -2    Dist cc  20/30 -2   Dist ph [place] 20/25 -2    Dist ph cc  NI   Correction: Glasses       Tonometry (Tonopen, 2:38 PM)      Right Left   Pressure 14 14       Pupils      Dark Light Shape React APD   Right 2 1 Round Minimal 0   Left 2 1 Round Minimal 0       Visual Fields      Left Right    Full Full       Extraocular Movement      Right Left    Full Full        Neuro/Psych    Oriented x3: Yes   Mood/Affect: Normal       Dilation    Both eyes: 1.0% Mydriacyl, 2.5% Phenylephrine @ 2:38 PM        Slit Lamp and Fundus Exam    Slit Lamp Exam      Right Left   Lids/Lashes Dermatochalasis - upper lid, mild Meibomian gland dysfunction, Telangiectasia Dermatochalasis - upper lid, Meibomian gland dysfunction, Telangiectasia   Conjunctiva/Sclera White and quiet, sutures intact White and quiet   Cornea Clear, trace Punctate epithelial erosions, well healed temporal cataract wounds Trace Punctate epithelial erosions   Anterior Chamber Deep and quiet Deep and quiet   Iris Round and dilated, No NVI Round and dilated, No NVI   Lens PC IOL in good position, 1-2+ Posterior capsular opacification, PC folds 2-3+ Nuclear sclerosis, 2-3+ Cortical cataract   Vitreous post vitrectomy, trace pigment Vitreous syneresis, blood stained vitreous condensations improving centrally and settling inferiorly, residual blood clots inferiorly - improving, persistent blood stained floater just IN to disc - improving       Fundus Exam      Right Left   Disc mild Pallor, Sharp rim, temporal PPA Pink and sharp, Compact, PPA   C/D Ratio 0.2 0.0   Macula Flat, blunted foveal reflex, mild cystic changes ST to fovea -- slightly increased, persistent punctate exudate, scattered MA ST mac Flat, good foveal reflex, trace cystic changes with interval increase in IRF, scattered MA; trace ERM, light focal laser changes   Vessels attenuated, Tortuous attenuated, mild Tortuousity   Periphery Attached, scattered DBH greatest superiorly, 360 PRP, good laser fill in 360 attached, scattered IRH; 360 PRP scars -- with good early fill in changes, inferior periphery obscured by persistent VH        Refraction    Wearing Rx      Sphere  Cylinder   Right -3.75 Sphere   Left -3.75 Sphere          IMAGING AND PROCEDURES  Imaging and Procedures for _0 @  OCT, Retina - OU - Both Eyes        Right Eye Quality was good. Central Foveal Thickness: 369. Progression has worsened. Findings include abnormal foveal contour, epiretinal membrane, no SRF, intraretinal hyper-reflective material, intraretinal fluid (Interval increase in cystic changes temporal macula).   Left Eye Quality was good. Central Foveal Thickness: 304. Progression has worsened. Findings include normal foveal contour, intraretinal fluid, no SRF, intraretinal hyper-reflective material (Mild interval increase in IRF).   Notes *Images captured and stored on drive  Diagnosis / Impression:  DME OU OD: Interval increse cystic changes temporal macula OS: mild interval increase in IRF  Clinical management:  See below  Abbreviations: NFP - Normal foveal profile. CME - cystoid macular edema. PED - pigment epithelial detachment. IRF - intraretinal fluid. SRF - subretinal fluid. EZ - ellipsoid zone. ERM - epiretinal membrane. ORA - outer retinal atrophy. ORT - outer retinal tubulation. SRHM - subretinal hyper-reflective material         Intravitreal Injection, Pharmacologic Agent - OD - Right Eye       Time Out 05/18/2020. 3:28 PM. Confirmed correct patient, procedure, site, and patient consented.   Anesthesia Topical anesthesia was used. Anesthetic medications included Lidocaine 2%, Proparacaine 0.5%.   Procedure Preparation included 5% betadine to ocular surface, eyelid speculum. A (32g) needle was used.   Injection:  2 mg aflibercept Alfonse Flavors) SOLN   NDC: A3590391, Lot: [id number], Expiration date: 10/28/2020   Route: Intravitreal, Site: Right Eye, Waste: 0.05 mL  Post-op Post injection exam found visual acuity of at least counting fingers. The patient tolerated the procedure well. There were no complications. The patient received written and verbal post procedure care education. Post injection medications were not given.        Intravitreal Injection, Pharmacologic Agent - OS - Left Eye        Time Out 05/18/2020. 3:28 PM. Confirmed correct patient, procedure, site, and patient consented.   Anesthesia Topical anesthesia was used. Anesthetic medications included Lidocaine 2%, Proparacaine 0.5%.   Procedure Preparation included 5% betadine to ocular surface, eyelid speculum. A (32g) needle was used.   Injection:  2 mg aflibercept Alfonse Flavors) SOLN   NDC: M7179715, Lot: [id number], Expiration date: 12/28/2020   Route: Intravitreal, Site: Left Eye, Waste: 0.05 mL  Post-op Post injection exam found visual acuity of at least counting fingers. The patient tolerated the procedure well. There were no complications. The patient received written and verbal post procedure care education. Post injection medications were not given.                ASSESSMENT/PLAN:    ICD-10-CM   1. Proliferative diabetic retinopathy of both eyes with macular edema associated with type 2 diabetes mellitus (HCC)  G95.6213 Intravitreal Injection, Pharmacologic Agent - OD - Right Eye    Intravitreal Injection, Pharmacologic Agent - OS - Left Eye    aflibercept (EYLEA) SOLN 2 mg    aflibercept (EYLEA) SOLN 2 mg  2. Retinal edema  H35.81 OCT, Retina - OU - Both Eyes  3. Vitreous hemorrhage of left eye (HCC)  H43.12   4. Right endophthalmia  H44.001   5. Vitreous hemorrhage, right eye ([place])  H43.11   6. Essential hypertension  I10   7. Hypertensive retinopathy of both eyes  H35.033   8. Combined forms of  age-related cataract of left eye  H25.812   9. Pseudophakia of right eye  Z96.1    1,2.  Proliferative diabetic retinopathy w/ DME, OU  - HbA1c 11.0% (11.22.21), 8.3% (03.31.21, 12.2.20), 8.6% (06.30.20), 8.4% (02.14.20)  - s/p IVA OD #1 9.20.19, #2 (10.25.19), #3 (11.15.19), #4 (12.17.19), #5 (01.14.20), #6 (2.11.20), #7 (05.29.20), #8 (08.19.20), #9 (10.30.20), #10 (12.09.20), #11 (01.11.21), #12 (02.15.21), #13 (03.23.21), #14 (04.20.21), #15 (06.08.21), #16 (07.06.21)  - s/p IVA OS #1 9.27.19,  #2 (10.25.19), #3 (11.15.19), #4 (12.17.19), #5 (01.14.20), #6 (2.11.20), #7 (04.26.20), #8 (05.29.20), #9 (06.26.20), #10 (08.05.20), #11 (11.11.20), #12 (12.09.20), #13 (01.11.21), #14 (02.15.21), #15 (03.23.21), #16 (04.20.21), #17 (06.08.21) -- IVA resistance, #18 (09.27.21)  - s/p IVE OD #1 (08.03.21) -- sample, #2 (09.10.21), #3 (10.08.21), #4 (11.23.21), #5 (12.21.21), #6 (01.19.22) sample, #7 (2.16.22)  - s/p IVE OS #1 (10.25.21), #2 (11.23.21), #3 (12.21.21)  - S/P PRP OS (09.20.19), (5.19.20), (08.19.20), (04.28.21), (02.02.22)  - S/P PRP OD (9.27.19 and 11.21.19), fill-in (04.14.20) (09.03.20, surgery)  - S/P focal laser OS (07.06.21)  - FA (9.20.19) shows +NVE OU and leaking MA and capillary nonperfusion  - repeat FA 11.15.19 shows NV regressing OU  - pre-op: OD w/ VA stable at 20/25, but there is some preretinal fibrosis / tractional membranes just superior to disc and mild central DME  - s/p 25g PPV+MP+10% C3F8 gas OD (09.03.20) -- ERM/PRF removal OD  - BCVA stable at 20/25 OD, 20/30-2 OS             - fibrosis/ERM stably improved; retina attached  - OCT shows OD: interval increase in cystic changes temporal macula; OS: mild interval increase in IRF  - recommend IVE OD #8 and OS #4 today, 03.22.22  - pt wishes to proceed with injections  - RBA of procedure discussed, questions answered  - informed consent obtained   - see procedure note  - Avastin informed consent form signed and scanned on 01.11.2021 (OU)  - Eylea informed consent form signed and scanned on 08.03.2021  - [email or web address] benefits investigation started, 08.03.21 -- approved as of 09.10.21 -- 2022 insurance verified as of 02.16.22  - f/u 4 weeks, DFE, OCT, possible inj.  3. Vitreous hemorrhage OS -- recurrent, improving  - recurrent VH, onset 09.22.21  - etiology: secondary to PDR as described above (no RT/RD on exam)  - s/p PRP OS (9.20.19), (05.19.20), (08.19.20), (04.28.21)  - s/p PRP fill in (02.02.22)  - s/p IVA  OS on 4.26.20, 5.29.20, 6.26.20, 8.5.20,11.11.20, 12.06.20 and so on as above  - VH clear centrally and settled inferiorly  - BCVA slighlty decreased to 20/30 from 20/25  - completed Lotemax SM QID x7 days  - f/u 4 weeks DFE, OCT, possible injection  4. History of Endophthalmitis OD  - s/p IVA OU 04/09/2018  - s/p 25g PPV w/ intravitreal vanc, ceftaz and cefepime OD, 2.14.2020  - s/p intravitreal tap / vanc and ceflaz injections (02.16.20)             - gram stain (2.14.20) shows G+ cocci, WBCs mostly PMNs;   - repeat gram stain from t/i (2.16.20) -- no organisms, just WBCs             - cultures from vitreous grew rare Staph warneri; cultures from t/i -- no growth             - doing well, BCVA 20/25             -  inflammation/posterior debris resolved             - IOP 13 off Brimonidine  - completed po pred taper -- caused significant elevations in BG  - monitor  5. History of Vitreous Hemorrhage OD -- cleared from PPV x2 for endophthalmitis and ERM/preretinal fibrosis  - secondary to PDR as described below  - S/P IVA OD #1 (09.20.19), #2 (10.25.19), #3 (11.15.19), #4 (12.16.19), #5 (03/12/2018)  - S/P PRP OD #1 (09.27.19), fill in OD (11.21.19) -- each somewhat limited inferiorly by residual VH  6,7. Hypertensive retinopathy OU  - BP elevated today (9.27.21) -- 164/91, left arm  - discussed importance of tight BP control.  - monitor  8. Combined form age related cataract OS-   - The symptoms of cataract, surgical options, and treatments and risks were discussed with patient.  - discussed diagnosis and progression  9. Pseudophakia OD  - s/p CE/IOL OD (Dr. Zenia Resides, 12.11.20)  - beautiful surgeries, doing well  Ophthalmic Meds Ordered this visit:  Meds ordered this encounter  Medications  . aflibercept (EYLEA) SOLN 2 mg  . aflibercept (EYLEA) SOLN 2 mg      Return in about 4 weeks (around 06/15/2020).  There are no Patient Instructions on file for this visit.  This  document serves as a record of services personally performed by Gardiner Sleeper, MD, PhD. It was created on their behalf by Estill Bakes, COT an ophthalmic technician. The creation of this record is the provider's dictation and/or activities during the visit.    Electronically signed by: Estill Bakes, COT 3.21.22 @ 9:38 PM   This document serves as a record of services personally performed by Gardiner Sleeper, MD, PhD. It was created on their behalf by Roselee Nova, COMT. The creation of this record is the provider's dictation and/or activities during the visit.  Electronically signed by: Roselee Nova, COMT 05/18/20 9:38 PM  Gardiner Sleeper, M.D., Ph.D. Diseases & Surgery of the Retina and [place] 05/18/2020   I have reviewed the above documentation for accuracy and completeness, and I agree with the above. Gardiner Sleeper, M.D., Ph.D. 05/18/20 9:38 PM   Abbreviations: M myopia (nearsighted); A astigmatism; H hyperopia (farsighted); P presbyopia; Mrx spectacle prescription;  CTL contact lenses; OD right eye; OS left eye; OU both eyes  XT exotropia; ET esotropia; PEK punctate epithelial keratitis; PEE punctate epithelial erosions; DES dry eye syndrome; MGD meibomian gland dysfunction; ATs artificial tears; PFAT's preservative free artificial tears; [place] nuclear sclerotic cataract; PSC posterior subcapsular cataract; ERM epi-retinal membrane; PVD posterior vitreous detachment; RD retinal detachment; DM diabetes mellitus; DR diabetic retinopathy; NPDR non-proliferative diabetic retinopathy; PDR proliferative diabetic retinopathy; CSME clinically significant macular edema; DME diabetic macular edema; dbh dot blot hemorrhages; CWS cotton wool spot; POAG primary open angle glaucoma; C/D cup-to-disc ratio; HVF humphrey visual field; GVF goldmann visual field; OCT optical coherence tomography; IOP intraocular pressure; BRVO Branch retinal vein occlusion; CRVO central  retinal vein occlusion; CRAO central retinal artery occlusion; BRAO branch retinal artery occlusion; RT retinal tear; SB scleral buckle; PPV pars plana vitrectomy; VH Vitreous hemorrhage; PRP panretinal laser photocoagulation; IVK intravitreal kenalog; VMT vitreomacular traction; MH Macular hole;  NVD neovascularization of the disc; NVE neovascularization elsewhere; AREDS age related eye disease study; ARMD age related macular degeneration; POAG primary open angle glaucoma; EBMD epithelial/anterior basement membrane dystrophy; ACIOL anterior chamber intraocular lens; IOL intraocular lens; PCIOL posterior chamber intraocular lens; Phaco/IOL phacoemulsification with  intraocular lens placement; [place] photorefractive keratectomy; LASIK laser assisted in situ keratomileusis; HTN hypertension; DM diabetes mellitus; COPD chronic obstructive pulmonary disease

## 2020-05-18 ENCOUNTER — Other Ambulatory Visit: Payer: Self-pay

## 2020-05-18 ENCOUNTER — Ambulatory Visit (INDEPENDENT_AMBULATORY_CARE_PROVIDER_SITE_OTHER): Payer: 59 | Admitting: Ophthalmology

## 2020-05-18 ENCOUNTER — Encounter (INDEPENDENT_AMBULATORY_CARE_PROVIDER_SITE_OTHER): Payer: Self-pay | Admitting: Ophthalmology

## 2020-05-18 DIAGNOSIS — H4311 Vitreous hemorrhage, right eye: Secondary | ICD-10-CM

## 2020-05-18 DIAGNOSIS — H4312 Vitreous hemorrhage, left eye: Secondary | ICD-10-CM | POA: Diagnosis not present

## 2020-05-18 DIAGNOSIS — E113513 Type 2 diabetes mellitus with proliferative diabetic retinopathy with macular edema, bilateral: Secondary | ICD-10-CM | POA: Diagnosis not present

## 2020-05-18 DIAGNOSIS — H3581 Retinal edema: Secondary | ICD-10-CM

## 2020-05-18 DIAGNOSIS — H44001 Unspecified purulent endophthalmitis, right eye: Secondary | ICD-10-CM | POA: Diagnosis not present

## 2020-05-18 DIAGNOSIS — I10 Essential (primary) hypertension: Secondary | ICD-10-CM

## 2020-05-18 DIAGNOSIS — H25812 Combined forms of age-related cataract, left eye: Secondary | ICD-10-CM

## 2020-05-18 DIAGNOSIS — Z961 Presence of intraocular lens: Secondary | ICD-10-CM

## 2020-05-18 DIAGNOSIS — H35033 Hypertensive retinopathy, bilateral: Secondary | ICD-10-CM

## 2020-05-18 MED ORDER — AFLIBERCEPT 2MG/0.05ML IZ SOLN FOR KALEIDOSCOPE
2.0000 mg | INTRAVITREAL | Status: AC | PRN
  Administered 2020-05-18: 2 mg via INTRAVITREAL

## 2020-05-24 ENCOUNTER — Other Ambulatory Visit: Payer: Self-pay | Admitting: Family Medicine

## 2020-05-27 ENCOUNTER — Telehealth: Payer: Self-pay | Admitting: Family Medicine

## 2020-05-27 ENCOUNTER — Other Ambulatory Visit: Payer: Self-pay

## 2020-05-27 MED ORDER — LANTUS SOLOSTAR 100 UNIT/ML ~~LOC~~ SOPN: PEN_INJECTOR | SUBCUTANEOUS | 3 refills | Status: DC

## 2020-05-27 NOTE — Telephone Encounter (Signed)
Rx sent, patient made aware.

## 2020-05-27 NOTE — Telephone Encounter (Signed)
Patient called with update on her insulin dosage: She is taking 65-70 units daily and her blood sugar seems to stay around 101. Please update insulin RX sig.

## 2020-05-27 NOTE — Telephone Encounter (Signed)
Yes ok to change sig to reflect current insulin dosing

## 2020-05-27 NOTE — Telephone Encounter (Signed)
Please advise if ok to change Rx

## 2020-06-07 ENCOUNTER — Other Ambulatory Visit: Payer: Self-pay | Admitting: Family Medicine

## 2020-06-07 DIAGNOSIS — K3184 Gastroparesis: Secondary | ICD-10-CM

## 2020-06-07 DIAGNOSIS — E118 Type 2 diabetes mellitus with unspecified complications: Secondary | ICD-10-CM

## 2020-06-14 NOTE — Progress Notes (Signed)
[facility] Note  06/15/2020     CHIEF COMPLAINT Patient presents for Retina Follow Up   HISTORY OF PRESENT ILLNESS: Gloria Gomez is a 50 y.o. female who presents to the clinic today for:   HPI    Retina Follow Up    Patient presents with  Diabetic Retinopathy.  In both eyes.  This started 4 weeks ago.  Since onset it is stable.  I, the attending physician,  performed the HPI with the patient and updated documentation appropriately.          Comments    Pt here for retinal follow up 4 wks. Pt states vision is about the same, still having blurriness in OS as before but not worsened. No ocular pain or discomfort. BS was 96 this morning, last A1C taken was 9.1 about 2 months ago.        Last edited by Bernarda Caffey, MD on 06/15/2020 11:18 PM. (History)    pt states she feels like her vision is stable, she still has a slightly blurry spot in her left eye, she states the fluid she has been retaining has been okay for the past few days, but she had a 12 day span where it was "really bad", her dr gave her lasix to take as needed, she says her blood sugars are "great" right now, last A1c was 9.1 in February   Referring physician:   HISTORICAL INFORMATION:   Selected notes from the MEDICAL RECORD NUMBER Referred for DM exam   CURRENT MEDICATIONS: No current outpatient medications on file. (Ophthalmic Drugs)   No current facility-administered medications for this visit. (Ophthalmic Drugs)   Current Outpatient Medications (Other)  Medication Sig  . amLODipine (NORVASC) 5 MG tablet Take 1 tablet by mouth once daily  . furosemide (LASIX) 20 MG tablet Take 20 mg by mouth 2 (two) times daily.  Marland Kitchen HYDROcodone-acetaminophen (NORCO/VICODIN) 5-325 MG tablet Take 1-2 tablets by mouth every 6 (six) hours as needed for moderate pain.  Marland Kitchen insulin glargine (LANTUS SOLOSTAR) 100 UNIT/ML Solostar Pen 65-70 units SQ qhs, patient will be gradually titrating this dose to  get to target fasting glucose range of 100-110.  . liraglutide (VICTOZA) 18 MG/3ML SOPN INJECT 1.2MG INTO THE SKIN DAILY  . losartan (COZAAR) 100 MG tablet Take 1 tablet by mouth once daily  . metFORMIN (GLUCOPHAGE) 1000 MG tablet Take 1 tablet (1,000 mg total) by mouth 2 (two) times daily with a meal.  . metoCLOPramide (REGLAN) 5 MG tablet TAKE 1 TABLET BY MOUTH 4 TIMES DAILY BEFORE MEAL(S) AND AT BEDTIME  . metoprolol succinate (TOPROL-XL) 100 MG 24 hr tablet 2 tabs po qd  . Multiple Vitamin (MULTIVITAMIN WITH MINERALS) TABS tablet Take 1 tablet by mouth daily.  Marland Kitchen OVER THE COUNTER MEDICATION Take 2 capsules by mouth daily. Neu Remedy  . pantoprazole (PROTONIX) 40 MG tablet Take 1 tablet by mouth once daily  . promethazine (PHENERGAN) 12.5 MG tablet 1-2 tabs po q6h prn nausea  . rosuvastatin (CRESTOR) 10 MG tablet Take 1 tablet (10 mg total) by mouth daily. (Patient taking differently: Take 10 mg by mouth every other day.)  . verapamil (CALAN-SR) 120 MG CR tablet Take 1 tablet (120 mg total) by mouth at bedtime.   No current facility-administered medications for this visit. (Other)      REVIEW OF SYSTEMS: ROS    Positive for: Endocrine, Cardiovascular   Negative for: Constitutional, Gastrointestinal, Neurological, Skin, Genitourinary, Musculoskeletal, HENT, Eyes,  Respiratory, Psychiatric, Allergic/Imm, Heme/Lymph   Last edited by Kingsley Spittle, COT on 06/15/2020  2:22 PM. (History)       ALLERGIES Allergies  Allergen Reactions  . Gluten Meal Swelling  . Lisinopril Cough  . Pioglitazone Other (See Comments)    ELEVATED glucoses + worse chronic nausea    PAST MEDICAL HISTORY Past Medical History:  Diagnosis Date  . Abdominal bloating    likely from diab gastroparesis.  Dr. Havery Moros started trial of reglan 03/2019.  Marland Kitchen Benign brain tumor ([place])    Cystic lesion in cerebral aqueduct region with mild hydrocephalus-- stable MRI 02/2016.  Surveillance MRI 05/2017 --dilated  cerebral aqueduct related to aqueductal stenosis and subsequent mild hydrocephalus (due to the 11 mm stable cystic lesion in cerebral aqueduct---?congenitial?.  . Cataract    OU  . Dysmenorrhea    vicodin occ during first 2 days of cycle.  . Gluten intolerance    pt reports she underwent full GI w/u to r/o celiac dz  . Hepatic steatosis    ultrasound 08/2017. Hx of very mild elevation of ALT  . History of adenomatous polyp of colon 04/08/2019   recall Feb 2024  . Hyperlipidemia, mixed   . Hypertensive retinopathy    OU  . Hypertensive retinopathy of both eyes   . Insomnia   . Iron deficiency 01/2019   Hb 11.3. Hemoccults neg x 3 03/19/19. EGD and colonoscopy 04/08/19 showed NO cause for IDA.  Pt does have menorrhagia, though, so she'll see her GYN.  started FeSO4 325 qd approx 04/14/19.  . Menorrhagia    resulting in [place] 2021  . Proliferative diabetic retinopathy of both eyes ([place])    steroid injections 10/2017--improved  . Sensorineural hearing loss of left ear    Sudden left hearing loss summer 2016--no improvement with steroids 01/2015 so brain MRI done by Dr. Redmond Baseman and it showed brain tumor that was determined to be benign.  Pt's hearing not bad enough for hearing aid as of 06/2016.  . Type 2 diabetes with complication (HCC)    +microalbuminuria, diab retpthy, diabetic gastroparesis (gastric emptying study mildly abnl 03/2017).  Recommended lantus 08/2018 but pt declined. Mild microalbuminuria.  Marland Kitchen Uterine fibroid   . White coat hypertension    Past Surgical History:  Procedure Laterality Date  . ANOSCOPY  05/12/2019   Procedure: normal exam, minimal hemorrhoid disease. Hyertrophied anal papila, benign appearing, posterior midline. Surgeon: Leighton Ruff MD  . CHOLECYSTECTOMY  2000  . COLONOSCOPY  04/08/2019   5 adenomas, recall 3 yrs; no cause for IDA found.  Hypertrophied anal papillae->bx showed low grade dysplasia; GI referred her to colorectal surgeon.  .  ESOPHAGOGASTRODUODENOSCOPY  04/08/2019   mild chronic reactive gastritis. H pylori NEG.  No cause for IDA found.  . GAS INSERTION Right 10/31/2018   Procedure: Insertion Of C3F8 Gas;  Surgeon: Bernarda Caffey, MD;  Location: [place];  Service: Ophthalmology;  Laterality: Right;  . GASTRIC EMPTYING SCAN  04/20/2017   Mildly abnormal, particularly the 1st hour of emptying.  Marland Kitchen MEMBRANE PEEL Right 10/31/2018   Procedure: MEMBRANE PEEL;  Surgeon: Bernarda Caffey, MD;  Location: [place];  Service: Ophthalmology;  Laterality: Right;  . PARS PLANA VITRECTOMY Right 04/12/2018   Procedure: Right PARS PLANA VITRECTOMY WITH 25 GAUGE with intravitreal antibiotics;  Surgeon: Bernarda Caffey, MD;  Location: [place];  Service: Ophthalmology;  Laterality: Right;  . PARS PLANA VITRECTOMY Right 10/31/2018   Procedure: PARS PLANA VITRECTOMY WITH 25 GAUGE;  Surgeon:  Bernarda Caffey, MD;  Location: [place];  Service: Ophthalmology;  Laterality: Right;  . PHOTOCOAGULATION WITH LASER Right 10/31/2018   Procedure: Photocoagulation With Laser;  Surgeon: Bernarda Caffey, MD;  Location: [place];  Service: Ophthalmology;  Laterality: Right;    FAMILY HISTORY Family History  Problem Relation Age of Onset  . Brain cancer Mother   . Diabetes Father   . Diabetes Maternal Grandmother   . Cataracts Maternal Grandmother   . Cervical cancer Paternal Grandmother   . Colon cancer Maternal Grandfather 25  . Amblyopia Neg Hx   . Blindness Neg Hx   . Glaucoma Neg Hx   . Macular degeneration Neg Hx   . Retinal detachment Neg Hx   . Strabismus Neg Hx   . Retinitis pigmentosa Neg Hx   . Esophageal cancer Neg Hx   . Stomach cancer Neg Hx   . Rectal cancer Neg Hx     SOCIAL HISTORY Social History   Tobacco Use  . Smoking status: Never Smoker  . Smokeless tobacco: Never Used  Vaping Use  . Vaping Use: Never used  Substance Use Topics  . Alcohol use: No  . Drug use: No         OPHTHALMIC EXAM:  Base Eye Exam    Visual Acuity (Snellen -  Linear)      Right Left   Dist [place] 20/30 +2    Dist cc  20/30 -1   Dist ph [place] NI    Dist ph cc  20/30 +2   Correction: Glasses       Tonometry (Tonopen, 2:29 PM)      Right Left   Pressure 17 15       Pupils      Dark Light Shape React APD   Right 2 1 Round Minimal None   Left 2 1 Round Minimal None       Visual Fields (Counting fingers)      Left Right    Full Full       Extraocular Movement      Right Left    Full, Ortho Full, Ortho       Neuro/Psych    Oriented x3: Yes   Mood/Affect: Normal       Dilation    Both eyes: 1.0% Mydriacyl, 2.5% Phenylephrine @ 2:30 PM        Slit Lamp and Fundus Exam    Slit Lamp Exam      Right Left   Lids/Lashes Dermatochalasis - upper lid, mild Meibomian gland dysfunction, Telangiectasia Dermatochalasis - upper lid, Meibomian gland dysfunction, Telangiectasia   Conjunctiva/Sclera White and quiet, sutures intact White and quiet   Cornea Clear, trace Punctate epithelial erosions, well healed temporal cataract wounds Trace Punctate epithelial erosions   Anterior Chamber Deep and quiet Deep and quiet   Iris Round and dilated, No NVI Round and dilated, No NVI   Lens PC IOL in good position, 1-2+ Posterior capsular opacification, PC folds 2-3+ Nuclear sclerosis, 2-3+ Cortical cataract   Vitreous post vitrectomy, trace pigment Vitreous syneresis, blood stained vitreous condensations improving centrally and settling inferiorly, residual blood clots inferiorly - improving, persistent blood stained floater just IN to disc - improving       Fundus Exam      Right Left   Disc mild Pallor, Sharp rim, temporal PPA Pink and sharp, Compact, PPA   C/D Ratio 0.2 0.0   Macula Flat, good foveal reflex, mild cystic changes ST to fovea --  improved, persistent punctate exudate, scattered MA ST mac -- improved Flat, good foveal reflex, trace cystic changes -- improved, scattered MA; trace ERM, light focal laser changes   Vessels attenuated, Tortuous  attenuated, mild Tortuousity   Periphery Attached, scattered DBH greatest superiorly, 360 PRP, good laser fill in 360 attached, scattered IRH; 360 PRP scars -- with good early fill in changes, inferior periphery obscured by persistent VH        Refraction    Wearing Rx      Sphere Cylinder   Right -3.75 Sphere   Left -3.75 Sphere          IMAGING AND PROCEDURES  Imaging and Procedures for _0 @  OCT, Retina - OU - Both Eyes       Right Eye Quality was good. Central Foveal Thickness: 334. Progression has improved. Findings include abnormal foveal contour, epiretinal membrane, no SRF, intraretinal hyper-reflective material, intraretinal fluid (Interval improvement in cystic changes temporal macula).   Left Eye Quality was good. Central Foveal Thickness: 287. Progression has improved. Findings include normal foveal contour, intraretinal fluid, no SRF, intraretinal hyper-reflective material (interval improvement in IRF, just trace cystic changes remaining).   Notes *Images captured and stored on drive  Diagnosis / Impression:  DME OU OD: Interval improvement in cystic changes temporal macula OS: interval improvement in IRF, just trace cystic changes remaining  Clinical management:  See below  Abbreviations: NFP - Normal foveal profile. CME - cystoid macular edema. PED - pigment epithelial detachment. IRF - intraretinal fluid. SRF - subretinal fluid. EZ - ellipsoid zone. ERM - epiretinal membrane. ORA - outer retinal atrophy. ORT - outer retinal tubulation. SRHM - subretinal hyper-reflective material         Intravitreal Injection, Pharmacologic Agent - OD - Right Eye       Time Out 06/15/2020. 2:47 PM. Confirmed correct patient, procedure, site, and patient consented.   Anesthesia Topical anesthesia was used. Anesthetic medications included Lidocaine 2%, Proparacaine 0.5%.   Procedure Preparation included 5% betadine to ocular surface, eyelid speculum. A (32g)  needle was used.   Injection:  2 mg aflibercept Alfonse Flavors) SOLN   NDC: A3590391, Lot: [id number], Expiration date: 01/27/2021   Route: Intravitreal, Site: Right Eye, Waste: 0.05 mL  Post-op Post injection exam found visual acuity of at least counting fingers. The patient tolerated the procedure well. There were no complications. The patient received written and verbal post procedure care education. Post injection medications were not given.                ASSESSMENT/PLAN:    ICD-10-CM   1. Proliferative diabetic retinopathy of both eyes with macular edema associated with type 2 diabetes mellitus (HCC)  U13.2440 Intravitreal Injection, Pharmacologic Agent - OD - Right Eye    aflibercept (EYLEA) SOLN 2 mg  2. Retinal edema  H35.81 OCT, Retina - OU - Both Eyes  3. Vitreous hemorrhage of left eye (HCC)  H43.12   4. Right endophthalmia  H44.001   5. Vitreous hemorrhage, right eye ([place])  H43.11   6. Essential hypertension  I10   7. Hypertensive retinopathy of both eyes  H35.033   8. Combined forms of age-related cataract of left eye  H25.812   9. Pseudophakia of right eye  Z96.1    1,2.  Proliferative diabetic retinopathy w/ DME, OU  - HbA1c 9.1% (02.24.22) 11.0% (11.22.21), 8.3% (03.31.21), 12.2.20), 8.6% (06.30.20), 8.4% (02.14.20)  - s/p IVA OD #1 9.20.19, #2 (10.25.19), #3 (11.15.19), #4 (12.17.19), #5 (01.14.20), #  6 (2.11.20), #7 (05.29.20), #8 (08.19.20), #9 (10.30.20), #10 (12.09.20), #11 (01.11.21), #12 (02.15.21), #13 (03.23.21), #14 (04.20.21), #15 (06.08.21), #16 (07.06.21)  - s/p IVA OS #1 9.27.19, #2 (10.25.19), #3 (11.15.19), #4 (12.17.19), #5 (01.14.20), #6 (2.11.20), #7 (04.26.20), #8 (05.29.20), #9 (06.26.20), #10 (08.05.20), #11 (11.11.20), #12 (12.09.20), #13 (01.11.21), #14 (02.15.21), #15 (03.23.21), #16 (04.20.21), #17 (06.08.21) -- IVA resistance, #18 (09.27.21)  - s/p IVE OD #1 (08.03.21) -- sample, #2 (09.10.21), #3 (10.08.21), #4 (11.23.21), #5 (12.21.21), #6  (01.19.22) sample, #7 (2.16.22), #8 (3.22.22)  - s/p IVE OS #1 (10.25.21), #2 (11.23.21), #3 (12.21.21), #4 (3.22.22)  - S/P PRP OS (09.20.19), (5.19.20), (08.19.20), (04.28.21), (02.02.22)  - S/P PRP OD (9.27.19 and 11.21.19), fill-in (04.14.20) (09.03.20, surgery)  - S/P focal laser OS (07.06.21)  - FA (9.20.19) shows +NVE OU and leaking MA and capillary nonperfusion  - repeat FA 11.15.19 shows NV regressing OU  - pre-op: OD w/ VA stable at 20/25, but there is some preretinal fibrosis / tractional membranes just superior to disc and mild central DME  - s/p 25g PPV+MP+10% C3F8 gas OD (09.03.20) -- ERM/PRF removal OD  - BCVA stable at 20/30 OU             - fibrosis/ERM stably improved; retina attached  - OCT shows OD: interval improvement in cystic changes temporal macula; OS: mild interval improvement in IRF, just trace cystic changes remain  - recommend IVE OD #9 today, 04.19.22 -- will hold OS  - pt in agreement  - RBA of procedure discussed, questions answered  - informed consent obtained   - see procedure note  - Avastin informed consent form signed and scanned on 01.11.2021 (OU)  - Eylea informed consent form signed and scanned on 08.03.2021  - [email or web address] benefits investigation started, 08.03.21 -- approved as of 09.10.21 -- 2022 insurance verified as of 02.16.22  - f/u 5 weeks, DFE, OCT, possible injection(s)  3. Vitreous hemorrhage OS -- recurrent, improving  - recurrent VH, onset 09.22.21  - etiology: secondary to PDR as described above (no RT/RD on exam)  - s/p PRP OS (9.20.19), (05.19.20), (08.19.20), (04.28.21)  - s/p PRP fill in (02.02.22)  - s/p IVA OS on 4.26.20, 5.29.20, 6.26.20, 8.5.20,11.11.20, 12.06.20 and so on as above  - VH clear centrally and settled inferiorly  - BCVA stable at 20/30   - f/u 5 weeks DFE, OCT, possible injection  4. History of Endophthalmitis OD  - s/p IVA OU 04/09/2018  - s/p 25g PPV w/ intravitreal vanc, ceftaz and cefepime OD, 2.14.2020  -  s/p intravitreal tap / vanc and ceflaz injections (02.16.20)             - gram stain (2.14.20) shows G+ cocci, WBCs mostly PMNs;   - repeat gram stain from t/i (2.16.20) -- no organisms, just WBCs             - cultures from vitreous grew rare Staph warneri; cultures from t/i -- no growth             - doing well, BCVA 20/25             - inflammation/posterior debris resolved             - IOP 17 off Brimonidine  - completed po pred taper -- caused significant elevations in BG  - monitor  5. History of Vitreous Hemorrhage OD -- cleared from PPV x2 for endophthalmitis and ERM/preretinal fibrosis  - secondary to PDR as described  below  - S/P IVA OD #1 (09.20.19), #2 (10.25.19), #3 (11.15.19), #4 (12.16.19), #5 (03/12/2018)  - S/P PRP OD #1 (09.27.19), fill in OD (11.21.19) -- each somewhat limited inferiorly by residual VH  6,7. Hypertensive retinopathy OU  - BP elevated today (9.27.21) -- 164/91, left arm  - discussed importance of tight BP control.  - monitor  8. Combined form age related cataract OS-   - The symptoms of cataract, surgical options, and treatments and risks were discussed with patient.  - discussed diagnosis and progression  9. Pseudophakia OD  - s/p CE/IOL OD (Dr. Zenia Resides, 12.11.20)  - beautiful surgery, doing well  Ophthalmic Meds Ordered this visit:  Meds ordered this encounter  Medications  . aflibercept (EYLEA) SOLN 2 mg      Return in about 5 weeks (around 07/20/2020) for f/u PDR OU, DFE, OCT.  There are no Patient Instructions on file for this visit.  This document serves as a record of services personally performed by Gardiner Sleeper, MD, PhD. It was created on their behalf by Estill Bakes, COT an ophthalmic technician. The creation of this record is the provider's dictation and/or activities during the visit.    Electronically signed by: Estill Bakes, COT 4.18.22 @ 11:22 PM   This document serves as a record of services personally performed by  Gardiner Sleeper, MD, PhD. It was created on their behalf by San Jetty. Owens Shark, OA an ophthalmic technician. The creation of this record is the provider's dictation and/or activities during the visit.    Electronically signed by: San Jetty. Owens Shark, [place] 04.19.2022 11:22 PM  Gardiner Sleeper, M.D., Ph.D. Diseases & Surgery of the Retina and [place] 06/15/2020   I have reviewed the above documentation for accuracy and completeness, and I agree with the above. Gardiner Sleeper, M.D., Ph.D. 06/15/20 11:22 PM   Abbreviations: M myopia (nearsighted); A astigmatism; H hyperopia (farsighted); P presbyopia; Mrx spectacle prescription;  CTL contact lenses; OD right eye; OS left eye; OU both eyes  XT exotropia; ET esotropia; PEK punctate epithelial keratitis; PEE punctate epithelial erosions; DES dry eye syndrome; MGD meibomian gland dysfunction; ATs artificial tears; PFAT's preservative free artificial tears; [place] nuclear sclerotic cataract; PSC posterior subcapsular cataract; ERM epi-retinal membrane; PVD posterior vitreous detachment; RD retinal detachment; DM diabetes mellitus; DR diabetic retinopathy; NPDR non-proliferative diabetic retinopathy; PDR proliferative diabetic retinopathy; CSME clinically significant macular edema; DME diabetic macular edema; dbh dot blot hemorrhages; CWS cotton wool spot; POAG primary open angle glaucoma; C/D cup-to-disc ratio; HVF humphrey visual field; GVF goldmann visual field; OCT optical coherence tomography; IOP intraocular pressure; BRVO Branch retinal vein occlusion; CRVO central retinal vein occlusion; CRAO central retinal artery occlusion; BRAO branch retinal artery occlusion; RT retinal tear; SB scleral buckle; PPV pars plana vitrectomy; VH Vitreous hemorrhage; PRP panretinal laser photocoagulation; IVK intravitreal kenalog; VMT vitreomacular traction; MH Macular hole;  NVD neovascularization of the disc; NVE neovascularization elsewhere;  AREDS age related eye disease study; ARMD age related macular degeneration; POAG primary open angle glaucoma; EBMD epithelial/anterior basement membrane dystrophy; ACIOL anterior chamber intraocular lens; IOL intraocular lens; PCIOL posterior chamber intraocular lens; Phaco/IOL phacoemulsification with intraocular lens placement; [place] photorefractive keratectomy; LASIK laser assisted in situ keratomileusis; HTN hypertension; DM diabetes mellitus; COPD chronic obstructive pulmonary disease

## 2020-06-15 ENCOUNTER — Other Ambulatory Visit: Payer: Self-pay

## 2020-06-15 ENCOUNTER — Encounter (INDEPENDENT_AMBULATORY_CARE_PROVIDER_SITE_OTHER): Payer: Self-pay | Admitting: Ophthalmology

## 2020-06-15 ENCOUNTER — Ambulatory Visit (INDEPENDENT_AMBULATORY_CARE_PROVIDER_SITE_OTHER): Payer: 59 | Admitting: Ophthalmology

## 2020-06-15 DIAGNOSIS — E113513 Type 2 diabetes mellitus with proliferative diabetic retinopathy with macular edema, bilateral: Secondary | ICD-10-CM | POA: Diagnosis not present

## 2020-06-15 DIAGNOSIS — I10 Essential (primary) hypertension: Secondary | ICD-10-CM

## 2020-06-15 DIAGNOSIS — H44001 Unspecified purulent endophthalmitis, right eye: Secondary | ICD-10-CM

## 2020-06-15 DIAGNOSIS — H4312 Vitreous hemorrhage, left eye: Secondary | ICD-10-CM

## 2020-06-15 DIAGNOSIS — H25812 Combined forms of age-related cataract, left eye: Secondary | ICD-10-CM

## 2020-06-15 DIAGNOSIS — H4311 Vitreous hemorrhage, right eye: Secondary | ICD-10-CM

## 2020-06-15 DIAGNOSIS — H3581 Retinal edema: Secondary | ICD-10-CM | POA: Diagnosis not present

## 2020-06-15 DIAGNOSIS — Z961 Presence of intraocular lens: Secondary | ICD-10-CM

## 2020-06-15 DIAGNOSIS — H35033 Hypertensive retinopathy, bilateral: Secondary | ICD-10-CM

## 2020-06-15 LAB — HM DIABETES EYE EXAM

## 2020-06-15 MED ORDER — AFLIBERCEPT 2MG/0.05ML IZ SOLN FOR KALEIDOSCOPE
2.0000 mg | INTRAVITREAL | Status: AC | PRN
  Administered 2020-06-15: 2 mg via INTRAVITREAL

## 2020-07-02 ENCOUNTER — Other Ambulatory Visit: Payer: Self-pay

## 2020-07-02 ENCOUNTER — Encounter: Payer: Self-pay | Admitting: Family Medicine

## 2020-07-02 ENCOUNTER — Ambulatory Visit (INDEPENDENT_AMBULATORY_CARE_PROVIDER_SITE_OTHER): Payer: 59 | Admitting: Family Medicine

## 2020-07-02 VITALS — BP 135/75 | HR 84 | Temp 98.0°F | Resp 16 | Ht 63.0 in | Wt 188.8 lb

## 2020-07-02 DIAGNOSIS — K7581 Nonalcoholic steatohepatitis (NASH): Secondary | ICD-10-CM | POA: Diagnosis not present

## 2020-07-02 DIAGNOSIS — R140 Abdominal distension (gaseous): Secondary | ICD-10-CM | POA: Diagnosis not present

## 2020-07-02 DIAGNOSIS — R0609 Other forms of dyspnea: Secondary | ICD-10-CM

## 2020-07-02 DIAGNOSIS — R635 Abnormal weight gain: Secondary | ICD-10-CM | POA: Diagnosis not present

## 2020-07-02 DIAGNOSIS — R600 Localized edema: Secondary | ICD-10-CM

## 2020-07-02 DIAGNOSIS — R0600 Dyspnea, unspecified: Secondary | ICD-10-CM

## 2020-07-02 DIAGNOSIS — R609 Edema, unspecified: Secondary | ICD-10-CM | POA: Diagnosis not present

## 2020-07-02 DIAGNOSIS — R0601 Orthopnea: Secondary | ICD-10-CM

## 2020-07-02 LAB — COMPREHENSIVE METABOLIC PANEL
ALT: 61 U/L — ABNORMAL HIGH (ref 0–35)
AST: 34 U/L (ref 0–37)
Albumin: 4 g/dL (ref 3.5–5.2)
Alkaline Phosphatase: 72 U/L (ref 39–117)
BUN: 18 mg/dL (ref 6–23)
CO2: 25 mEq/L (ref 19–32)
Calcium: 9.7 mg/dL (ref 8.4–10.5)
Chloride: 106 mEq/L (ref 96–112)
Creatinine, Ser: 0.53 mg/dL (ref 0.40–1.20)
GFR: 106.94 mL/min (ref >60.00)
Glucose, Bld: 121 mg/dL — ABNORMAL HIGH (ref 70–99)
Potassium: 4.5 mEq/L (ref 3.5–5.1)
Sodium: 142 mEq/L (ref 135–145)
Total Bilirubin: 0.2 mg/dL (ref 0.2–1.2)
Total Protein: 6.5 g/dL (ref 6.0–8.3)

## 2020-07-02 LAB — PROTIME-INR
INR: 0.9 ratio (ref 0.8–1.0)
Prothrombin Time: 10.6 s (ref 9.6–13.1)

## 2020-07-02 LAB — TSH: TSH: 4.69 u[IU]/mL — ABNORMAL HIGH (ref 0.35–4.50)

## 2020-07-02 LAB — CBC
HCT: 33.7 % — ABNORMAL LOW (ref 36.0–46.0)
Hemoglobin: 11.1 g/dL — ABNORMAL LOW (ref 12.0–15.0)
MCHC: 33 g/dL (ref 30.0–36.0)
MCV: 84.8 fl (ref 78.0–100.0)
Platelets: 422 10*3/uL — ABNORMAL HIGH (ref 150.0–400.0)
RBC: 3.98 Mil/uL (ref 3.87–5.11)
RDW: 15.2 % (ref 11.5–15.5)
WBC: 10.9 10*3/uL — ABNORMAL HIGH (ref 4.0–10.5)

## 2020-07-02 MED ORDER — FUROSEMIDE 20 MG PO TABS: ORAL_TABLET | ORAL | 1 refills | Status: DC

## 2020-07-02 NOTE — Progress Notes (Signed)
OFFICE VISIT  07/02/2020  CC:  Chief Complaint  Patient presents with  . Weight gain    Started Feb and has gotten worse since then. Husband states it is affecting her breathing and activity. Still has bilateral leg edema   HPI:    Patient is a 50 y.o. Caucasian female who presents for weight gain. A/P as of last visit on 04/22/20: "1) DM 2. Cont to titrate lantus to get fasting gluc consistently 100-110 range. Change janumet to metformin since she's on victoza and januvia no longer needed (eRx'd metformin 1000 mg bid today).  Continue victoza qd. Hba1c and urine microalb/cr today  2) HTN: stable. Cont toprol xl 100mg  bid, losartan 100mg  qd, and amlodipine 5mg  qd. Lytes/cr today.  3) HLD: tolerating rosova 10mg  qod. FLP and hepatic panel today.  4) Diabetic gastroparesis. Some improvement with reglan.  Has GI f/u in about a month. Some "fluid retention" in abd may have been amlodipine effect. Got better with brief course of lasix (mild LE swelling improved with this as well). "  INTERIM HX: Gradually increased feeling of swelling, mostly feet and ankles and abd, some in hands and face more recently.  Says eats low Na level in diet.  Feeling more winded with typical daily activities: walking to garden for example, has to rest.  Lying supine it is harder to get deep breaths.  Having some PND as well. No chest pain.  L leg seems a little more swollen than R. Has recently taken 5d course of lasix 20mg  qd but didn't note signif change in urine output and did not lose wt.  Started lantus insulin 02/2020: up to 65-70 U lantus---fastings around 110 avg now.  Says postprandial bloating/upset stomach/heaviness still about the same.  Takes reglan with each meal. No n/v.  She does have chronic constipation.  Has BM q2-3d, small amount, constantly feels like she doesn't empty colon.  Stool appears normal.  No laxative or stool softener or fiber supplement being taken.  ROS as above,  plus--> no fevers, no wheezing, no cough, no dizziness, no HAs, no rashes, no melena/hematochezia.  No polyuria or polydipsia.  No myalgias or arthralgias.  No focal weakness, paresthesias, or tremors.  No acute vision or hearing abnormalities.  No dysuria or unusual/new urinary urgency or frequency.  No abd pain/cramping.  No palpitations.    Past Medical History:  Diagnosis Date  . Abdominal bloating    likely from diab gastroparesis.  Dr. Havery Moros started trial of reglan 03/2019.  Marland Kitchen Benign brain tumor ([place])    Cystic lesion in cerebral aqueduct region with mild hydrocephalus-- stable MRI 02/2016.  Surveillance MRI 05/2017 --dilated cerebral aqueduct related to aqueductal stenosis and subsequent mild hydrocephalus (due to the 11 mm stable cystic lesion in cerebral aqueduct---?congenitial?.  . Cataract    OU  . Dysmenorrhea    vicodin occ during first 2 days of cycle.  . Gluten intolerance    pt reports she underwent full GI w/u to r/o celiac dz  . Hepatic steatosis    ultrasound 08/2017. Hx of very mild elevation of ALT  . History of adenomatous polyp of colon 04/08/2019   recall Feb 2024  . Hyperlipidemia, mixed   . Hypertensive retinopathy    OU  . Hypertensive retinopathy of both eyes   . Insomnia   . Iron deficiency 01/2019   Hb 11.3. Hemoccults neg x 3 03/19/19. EGD and colonoscopy 04/08/19 showed NO cause for IDA.  Pt does have menorrhagia, though,  so she'll see her GYN.  started FeSO4 325 qd approx 04/14/19.  . Menorrhagia    resulting in [place] 2021  . Proliferative diabetic retinopathy of both eyes ([place])    steroid injections 10/2017--improved  . Sensorineural hearing loss of left ear    Sudden left hearing loss summer 2016--no improvement with steroids 01/2015 so brain MRI done by Dr. Redmond Baseman and it showed brain tumor that was determined to be benign.  Pt's hearing not bad enough for hearing aid as of 06/2016.  . Type 2 diabetes with complication (HCC)    +microalbuminuria, diab  retpthy, diabetic gastroparesis (gastric emptying study mildly abnl 03/2017).  Recommended lantus 08/2018 but pt declined. Mild microalbuminuria.  Marland Kitchen Uterine fibroid   . White coat hypertension     Past Surgical History:  Procedure Laterality Date  . ANOSCOPY  05/12/2019   Procedure: normal exam, minimal hemorrhoid disease. Hyertrophied anal papila, benign appearing, posterior midline. Surgeon: Leighton Ruff MD  . CHOLECYSTECTOMY  2000  . COLONOSCOPY  04/08/2019   5 adenomas, recall 3 yrs; no cause for IDA found.  Hypertrophied anal papillae->bx showed low grade dysplasia; GI referred her to colorectal surgeon.  . ESOPHAGOGASTRODUODENOSCOPY  04/08/2019   mild chronic reactive gastritis. H pylori NEG.  No cause for IDA found.  . GAS INSERTION Right 10/31/2018   Procedure: Insertion Of C3F8 Gas;  Surgeon: Bernarda Caffey, MD;  Location: [place];  Service: Ophthalmology;  Laterality: Right;  . GASTRIC EMPTYING SCAN  04/20/2017   Mildly abnormal, particularly the 1st hour of emptying.  Marland Kitchen MEMBRANE PEEL Right 10/31/2018   Procedure: MEMBRANE PEEL;  Surgeon: Bernarda Caffey, MD;  Location: [place];  Service: Ophthalmology;  Laterality: Right;  . PARS PLANA VITRECTOMY Right 04/12/2018   Procedure: Right PARS PLANA VITRECTOMY WITH 25 GAUGE with intravitreal antibiotics;  Surgeon: Bernarda Caffey, MD;  Location: [place];  Service: Ophthalmology;  Laterality: Right;  . PARS PLANA VITRECTOMY Right 10/31/2018   Procedure: PARS PLANA VITRECTOMY WITH 25 GAUGE;  Surgeon: Bernarda Caffey, MD;  Location: [place];  Service: Ophthalmology;  Laterality: Right;  . PHOTOCOAGULATION WITH LASER Right 10/31/2018   Procedure: Photocoagulation With Laser;  Surgeon: Bernarda Caffey, MD;  Location: [place];  Service: Ophthalmology;  Laterality: Right;    Outpatient Medications Prior to Visit  Medication Sig Dispense Refill  . amLODipine (NORVASC) 5 MG tablet Take 1 tablet by mouth once daily 90 tablet 0  . insulin glargine (LANTUS SOLOSTAR) 100  UNIT/ML Solostar Pen 65-70 units SQ qhs, patient will be gradually titrating this dose to get to target fasting glucose range of 100-110. 15 mL 3  . liraglutide (VICTOZA) 18 MG/3ML SOPN INJECT 1.2MG  INTO THE SKIN DAILY 18 mL 0  . losartan (COZAAR) 100 MG tablet Take 1 tablet by mouth once daily 90 tablet 0  . metFORMIN (GLUCOPHAGE) 1000 MG tablet Take 1 tablet (1,000 mg total) by mouth 2 (two) times daily with a meal. 180 tablet 3  . metoCLOPramide (REGLAN) 5 MG tablet TAKE 1 TABLET BY MOUTH 4 TIMES DAILY BEFORE MEAL(S) AND AT BEDTIME 120 tablet 0  . metoprolol succinate (TOPROL-XL) 100 MG 24 hr tablet 2 tabs po qd 60 tablet 6  . Multiple Vitamin (MULTIVITAMIN WITH MINERALS) TABS tablet Take 1 tablet by mouth daily.    Marland Kitchen OVER THE COUNTER MEDICATION Take 2 capsules by mouth daily. Neu Remedy    . pantoprazole (PROTONIX) 40 MG tablet Take 1 tablet by mouth once daily 90 tablet 0  .  rosuvastatin (CRESTOR) 10 MG tablet Take 1 tablet (10 mg total) by mouth daily. (Patient taking differently: Take 10 mg by mouth every other day.) 30 tablet 4  . verapamil (CALAN-SR) 120 MG CR tablet Take 1 tablet (120 mg total) by mouth at bedtime. 30 tablet 3  . furosemide (LASIX) 20 MG tablet Take 20 mg by mouth 2 (two) times daily.    Marland Kitchen HYDROcodone-acetaminophen (NORCO/VICODIN) 5-325 MG tablet Take 1-2 tablets by mouth every 6 (six) hours as needed for moderate pain. (Patient not taking: Reported on 07/02/2020) 40 tablet 0  . promethazine (PHENERGAN) 12.5 MG tablet 1-2 tabs po q6h prn nausea (Patient not taking: Reported on 07/02/2020) 30 tablet 1   No facility-administered medications prior to visit.    Allergies  Allergen Reactions  . Gluten Meal Swelling  . Lisinopril Cough  . Pioglitazone Other (See Comments)    ELEVATED glucoses + worse chronic nausea    ROS As per HPI  PE: Vitals with BMI 07/02/2020 04/22/2020 02/26/2020  Height 5\' 3"  5\' 3"  5\' 3"   Weight 188 lbs 13 oz 176 lbs 3 oz 173 lbs 10 oz  BMI 33.45  57.84 69.62  Systolic 952 841 324  Diastolic 75 84 85  Pulse 84 82 93     Gen: Alert, well appearing.  Patient is oriented to person, place, time, and situation. AFFECT: pleasant, lucid thought and speech. CV: RRR, no m/r/g.   LUNGS: CTA bilat, nonlabored resps, good aeration in all lung fields. ABD: mild/mod distention, no tenderness.  BS normal.  No discernible HSM or mass.  No bruit. EXT: no clubbing or cyanosis.  2+ pitting edema in both LL's from just below knees down into feet. No skin changes.  No signifcant asymmetry of LL's.  No tenderness of LL's.    LABS:    Chemistry      Component Value Date/Time   NA 139 04/22/2020 1104   K 4.9 04/22/2020 1104   CL 104 04/22/2020 1104   CO2 28 04/22/2020 1104   BUN 19 04/22/2020 1104   CREATININE 0.53 04/22/2020 1104   CREATININE 0.59 01/29/2019 1102      Component Value Date/Time   CALCIUM 9.5 04/22/2020 1104   ALKPHOS 62 04/22/2020 1104   AST 42 (H) 04/22/2020 1104   ALT 70 (H) 04/22/2020 1104   BILITOT 0.3 04/22/2020 1104     Lab Results  Component Value Date   WBC 7.0 01/19/2020   HGB 12.3 01/19/2020   HCT 37.4 01/19/2020   MCV 86.6 01/19/2020   PLT 429.0 (H) 01/19/2020   Lab Results  Component Value Date   TSH 1.37 06/23/2019   Lab Results  Component Value Date   CHOL 141 04/22/2020   HDL 43.20 04/22/2020   LDLCALC 58 04/22/2020   LDLDIRECT 77.0 07/19/2016   TRIG 195.0 (H) 04/22/2020   CHOLHDL 3 04/22/2020   Lab Results  Component Value Date   HGBA1C 9.6 (H) 04/22/2020    IMPRESSION AND PLAN:  1) Fluid retention, wt gain of 13 lbs over the last 2.5 mo. Given her report of DOE, orthopnea, and PND (+ her increased edema) I'll check echocardiogram. Given her hx of NAFLD I'll check abd u/s for signs of any progression to hepatosplenomegaly/portal HTN or cirrhosis.   Check CMET, PT/INR, CBC, TSH.  Recent low dose daily (x 5d) lasix no signif help so I'll do 40mg  qAM and 20mg  qPM x 5d and I'll see her  back in 5-7d and recheck bmet  at that time.  2) DM: better control on lantus as she has titrated this up. Next a1c in 1 mo or so.  If still >8 then will talk to her about adding mealtime insulin. Cont metformin 1000mg  bid and victiza 1.2mg  qd.  3) Diabetic gastroparesis: pretty stable, though not ideal control overall. Cont reglan 5mg  qid prn. She has not seen Dr. Havery Moros, her GI MD, since 02/2019 so it would be good for her to f/u with him in the near future.  4) Chronic constipation: pt not taking anything. Recommended she start miralax 1 capful qd and titrate up.  An After Visit Summary was printed and given to the patient.  FOLLOW UP: Return for 5-7d f/u edema and check bmet.  Signed:  Crissie Sickles, MD           07/02/2020

## 2020-07-07 ENCOUNTER — Other Ambulatory Visit: Payer: Self-pay

## 2020-07-07 ENCOUNTER — Ambulatory Visit (INDEPENDENT_AMBULATORY_CARE_PROVIDER_SITE_OTHER): Payer: 59

## 2020-07-07 ENCOUNTER — Encounter: Payer: Self-pay | Admitting: Family Medicine

## 2020-07-07 DIAGNOSIS — R140 Abdominal distension (gaseous): Secondary | ICD-10-CM | POA: Diagnosis not present

## 2020-07-07 DIAGNOSIS — K7581 Nonalcoholic steatohepatitis (NASH): Secondary | ICD-10-CM | POA: Diagnosis not present

## 2020-07-07 DIAGNOSIS — R635 Abnormal weight gain: Secondary | ICD-10-CM | POA: Diagnosis not present

## 2020-07-07 DIAGNOSIS — R600 Localized edema: Secondary | ICD-10-CM | POA: Diagnosis not present

## 2020-07-12 ENCOUNTER — Ambulatory Visit (INDEPENDENT_AMBULATORY_CARE_PROVIDER_SITE_OTHER): Payer: 59 | Admitting: Family Medicine

## 2020-07-12 ENCOUNTER — Other Ambulatory Visit: Payer: Self-pay

## 2020-07-12 ENCOUNTER — Encounter: Payer: Self-pay | Admitting: Family Medicine

## 2020-07-12 VITALS — BP 114/71 | HR 88 | Temp 98.3°F | Resp 16 | Ht 63.0 in | Wt 184.8 lb

## 2020-07-12 DIAGNOSIS — Z862 Personal history of diseases of the blood and blood-forming organs and certain disorders involving the immune mechanism: Secondary | ICD-10-CM

## 2020-07-12 DIAGNOSIS — D649 Anemia, unspecified: Secondary | ICD-10-CM

## 2020-07-12 DIAGNOSIS — R809 Proteinuria, unspecified: Secondary | ICD-10-CM | POA: Diagnosis not present

## 2020-07-12 DIAGNOSIS — R609 Edema, unspecified: Secondary | ICD-10-CM

## 2020-07-12 LAB — MICROALBUMIN / CREATININE URINE RATIO
Creatinine,U: 70.8 mg/dL
Microalb Creat Ratio: 237.4 mg/g — ABNORMAL HIGH (ref 0.0–30.0)
Microalb, Ur: 168.1 mg/dL — ABNORMAL HIGH (ref 0.0–1.9)

## 2020-07-12 NOTE — Progress Notes (Signed)
OFFICE VISIT  07/12/2020  CC:  Chief Complaint  Patient presents with  . Follow-up    edema   HPI:    Patient is a 50 y.o. Caucasian female who presents accompanied by her husband for 10d f/u fluid retention. A/P as of last visit: "1) Fluid retention, wt gain of 13 lbs over the last 2.5 mo. Given her report of DOE, orthopnea, and PND (+ her increased edema) I'll check echocardiogram. Given her hx of NAFLD I'll check abd u/s for signs of any progression to hepatosplenomegaly/portal HTN or cirrhosis.   Check CMET, PT/INR, CBC, TSH.  Recent low dose daily (x 5d) lasix no signif help so I'll do 40mg  qAM and 20mg  qPM x 5d and I'll see her back in 5-7d and recheck bmet at that time.  2) DM: better control on lantus as she has titrated this up. Next a1c in 1 mo or so.  If still >8 then will talk to her about adding mealtime insulin. Cont metformin 1000mg  bid and victiza 1.2mg  qd.  3) Diabetic gastroparesis: pretty stable, though not ideal control overall. Cont reglan 5mg  qid prn. She has not seen Dr. Havery Moros, her GI MD, since 02/2019 so it would be good for her to f/u with him in the near future.  4) Chronic constipation: pt not taking anything. Recommended she start miralax 1 capful qd and titrate up."  INTERIM HX: Tolerated the inc lasix dosing w/out too much problem.   No longer having any orthopnea or PND. Feet and ankles swelling has gone down some, is worse with up and around more, go down when less active and elevating. Feeling better overall. She limits Na.   Eye MD still has her on "light duty" d/t retinopathy probs.  Abd u/s 07/07/20 showed diffuse echogenicity of hepatic parenchyma c/w hepatic steatosis, a known finding for her.  Echocardiogram has not been scheduled yet--she hasn't heard from scheduling yet.  Has hx of IDA d/t chronic vaginal blood loss/menorrhagia in the past-->Hb came up with oral iron. Most recent Hb was 11.1, MCV 85. Has not had menses in the  last 5 mo.  Not taking separate iron supp but takes MVI with iron.  Currently taking 65-70 U qd lantus qd, 1.2mg  victoza qd, and metformin 1g bid. Has hx of microalbuminuria->297 Feb 2022.  ROS as above, plus--> no fevers, no CP, no SOB at rest but gets mild DOE, no wheezing, no cough, no dizziness, no HAs, no rashes, no melena/hematochezia.  No polyuria or polydipsia.  No myalgias or arthralgias.  No focal weakness, paresthesias, or tremors.  No acute vision or hearing abnormalities.  No dysuria or unusual/new urinary urgency or frequency.  No n/v/d or abd pain.  No palpitations.     Past Medical History:  Diagnosis Date  . Abdominal bloating    likely from diab gastroparesis.  Dr. Havery Moros started trial of reglan 03/2019.  Marland Kitchen Benign brain tumor ([place])    Cystic lesion in cerebral aqueduct region with mild hydrocephalus-- stable MRI 02/2016.  Surveillance MRI 05/2017 --dilated cerebral aqueduct related to aqueductal stenosis and subsequent mild hydrocephalus (due to the 11 mm stable cystic lesion in cerebral aqueduct---?congenitial?.  . Cataract    OU  . Dysmenorrhea    vicodin occ during first 2 days of cycle.  . Gluten intolerance    pt reports she underwent full GI w/u to r/o celiac dz  . Hepatic steatosis    ultrasound 08/2017. Hx of very mild elevation of ALT.  Stable on u/s 06/2020  . History of adenomatous polyp of colon 04/08/2019   recall Feb 2024  . Hyperlipidemia, mixed   . Hypertensive retinopathy    OU  . Hypertensive retinopathy of both eyes   . Insomnia   . Iron deficiency 01/2019   Hb 11.3. Hemoccults neg x 3 03/19/19. EGD and colonoscopy 04/08/19 showed NO cause for IDA.  Pt does have menorrhagia, though, so she'll see her GYN.  started FeSO4 325 qd approx 04/14/19.  . Menorrhagia    resulting in [place] 2021  . Proliferative diabetic retinopathy of both eyes ([place])    steroid injections 10/2017--improved  . Sensorineural hearing loss of left ear    Sudden left hearing loss  summer 2016--no improvement with steroids 01/2015 so brain MRI done by Dr. Redmond Baseman and it showed brain tumor that was determined to be benign.  Pt's hearing not bad enough for hearing aid as of 06/2016.  . Type 2 diabetes with complication (HCC)    +microalbuminuria, diab retpthy, diabetic gastroparesis (gastric emptying study mildly abnl 03/2017).  Recommended lantus 08/2018 but pt declined. Mild microalbuminuria.  Marland Kitchen Uterine fibroid   . White coat hypertension     Past Surgical History:  Procedure Laterality Date  . ANOSCOPY  05/12/2019   Procedure: normal exam, minimal hemorrhoid disease. Hyertrophied anal papila, benign appearing, posterior midline. Surgeon: Leighton Ruff MD  . CHOLECYSTECTOMY  2000  . COLONOSCOPY  04/08/2019   5 adenomas, recall 3 yrs; no cause for IDA found.  Hypertrophied anal papillae->bx showed low grade dysplasia; GI referred her to colorectal surgeon.  . ESOPHAGOGASTRODUODENOSCOPY  04/08/2019   mild chronic reactive gastritis. H pylori NEG.  No cause for IDA found.  . GAS INSERTION Right 10/31/2018   Procedure: Insertion Of C3F8 Gas;  Surgeon: Bernarda Caffey, MD;  Location: [place];  Service: Ophthalmology;  Laterality: Right;  . GASTRIC EMPTYING SCAN  04/20/2017   Mildly abnormal, particularly the 1st hour of emptying.  Marland Kitchen MEMBRANE PEEL Right 10/31/2018   Procedure: MEMBRANE PEEL;  Surgeon: Bernarda Caffey, MD;  Location: [place];  Service: Ophthalmology;  Laterality: Right;  . PARS PLANA VITRECTOMY Right 04/12/2018   Procedure: Right PARS PLANA VITRECTOMY WITH 25 GAUGE with intravitreal antibiotics;  Surgeon: Bernarda Caffey, MD;  Location: [place];  Service: Ophthalmology;  Laterality: Right;  . PARS PLANA VITRECTOMY Right 10/31/2018   Procedure: PARS PLANA VITRECTOMY WITH 25 GAUGE;  Surgeon: Bernarda Caffey, MD;  Location: [place];  Service: Ophthalmology;  Laterality: Right;  . PHOTOCOAGULATION WITH LASER Right 10/31/2018   Procedure: Photocoagulation With Laser;  Surgeon: Bernarda Caffey, MD;  Location: [place];  Service: Ophthalmology;  Laterality: Right;    Outpatient Medications Prior to Visit  Medication Sig Dispense Refill  . amLODipine (NORVASC) 5 MG tablet Take 1 tablet by mouth once daily 90 tablet 0  . furosemide (LASIX) 20 MG tablet 2 tabs po qAM and 1 tab po qPM 90 tablet 1  . insulin glargine (LANTUS SOLOSTAR) 100 UNIT/ML Solostar Pen 65-70 units SQ qhs, patient will be gradually titrating this dose to get to target fasting glucose range of 100-110. 15 mL 3  . liraglutide (VICTOZA) 18 MG/3ML SOPN INJECT 1.2MG  INTO THE SKIN DAILY 18 mL 0  . losartan (COZAAR) 100 MG tablet Take 1 tablet by mouth once daily 90 tablet 0  . metFORMIN (GLUCOPHAGE) 1000 MG tablet Take 1 tablet (1,000 mg total) by mouth 2 (two) times daily with a meal. 180 tablet 3  .  metoCLOPramide (REGLAN) 5 MG tablet TAKE 1 TABLET BY MOUTH 4 TIMES DAILY BEFORE MEAL(S) AND AT BEDTIME 120 tablet 0  . metoprolol succinate (TOPROL-XL) 100 MG 24 hr tablet 2 tabs po qd 60 tablet 6  . Multiple Vitamin (MULTIVITAMIN WITH MINERALS) TABS tablet Take 1 tablet by mouth daily.    Marland Kitchen OVER THE COUNTER MEDICATION Take 2 capsules by mouth daily. Neu Remedy    . pantoprazole (PROTONIX) 40 MG tablet Take 1 tablet by mouth once daily 90 tablet 0  . rosuvastatin (CRESTOR) 10 MG tablet Take 1 tablet (10 mg total) by mouth daily. (Patient taking differently: Take 10 mg by mouth every other day.) 30 tablet 4  . verapamil (CALAN-SR) 120 MG CR tablet Take 1 tablet (120 mg total) by mouth at bedtime. 30 tablet 3  . HYDROcodone-acetaminophen (NORCO/VICODIN) 5-325 MG tablet Take 1-2 tablets by mouth every 6 (six) hours as needed for moderate pain. (Patient not taking: No sig reported) 40 tablet 0  . promethazine (PHENERGAN) 12.5 MG tablet 1-2 tabs po q6h prn nausea (Patient not taking: No sig reported) 30 tablet 1   No facility-administered medications prior to visit.    Allergies  Allergen Reactions  . Gluten Meal Swelling   . Lisinopril Cough  . Pioglitazone Other (See Comments)    ELEVATED glucoses + worse chronic nausea    ROS As per HPI  PE: Vitals with BMI 07/12/2020 07/02/2020 04/22/2020  Height 5\' 3"  5\' 3"  5\' 3"   Weight 184 lbs 13 oz 188 lbs 13 oz 176 lbs 3 oz  BMI 32.74 22.02 54.27  Systolic 062 376 283  Diastolic 71 75 84  Pulse 88 84 82   Gen: Alert, well appearing.  Patient is oriented to person, place, time, and situation. AFFECT: pleasant, lucid thought and speech. CV: RRR, no m/r/g.   LUNGS: CTA bilat, nonlabored resps, good aeration in all lung fields. EXT: no clubbing or cyanosis.  2+ pitting edema in both LLs down into feet, L side starts a bit higher up her LL than R leg.   LABS:  Lab Results  Component Value Date   TSH 4.69 (H) 07/02/2020   Lab Results  Component Value Date   WBC 10.9 (H) 07/02/2020   HGB 11.1 (L) 07/02/2020   HCT 33.7 (L) 07/02/2020   MCV 84.8 07/02/2020   PLT 422.0 (H) 07/02/2020   Lab Results  Component Value Date   IRON 54 01/19/2020   TIBC 383 01/19/2020   FERRITIN 52 01/19/2020   Lab Results  Component Value Date   CREATININE 0.53 07/02/2020   BUN 18 07/02/2020   NA 142 07/02/2020   K 4.5 07/02/2020   CL 106 07/02/2020   CO2 25 07/02/2020   Lab Results  Component Value Date   ALT 61 (H) 07/02/2020   AST 34 07/02/2020   ALKPHOS 72 07/02/2020   BILITOT 0.2 07/02/2020   Lab Results  Component Value Date   CHOL 141 04/22/2020   Lab Results  Component Value Date   HDL 43.20 04/22/2020   Lab Results  Component Value Date   LDLCALC 58 04/22/2020   Lab Results  Component Value Date   TRIG 195.0 (H) 04/22/2020   Lab Results  Component Value Date   CHOLHDL 3 04/22/2020   Lab Results  Component Value Date   HGBA1C 9.6 (H) 04/22/2020   IMPRESSION AND PLAN:  1) Fluid retention: unclear etiology. Liver/biliary system looked fine on u/s. Awaiting scheduling of echocardiogram. This condition  is improved with inc dosing of  lasix since last visit. Wt loss 4 lbs. In 10d. Given her hx of albuminuria I'll recheck microalb/cr today.  Her serum protein/albumin levels have been consistently normal. No changes made today.  Continue low Na intake. Await labs from today to see if lasix dose can be continued as-is.  2) DM: better control on lantus as she has titrated this up. Next a1c due in 2 wks or so.  If still >8 then will talk to her about adding mealtime insulin. Cont metformin 1000mg  bid and victiza 1.2mg  qd.  3) Diabetic gastroparesis: pretty stable, though not ideal control overall. Cont reglan 5mg  qid prn. She has not seen Dr. Havery Moros, her GI MD, since 02/2019 so it would be good for her to f/u with him in the near future.  An After Visit Summary was printed and given to the patient.  FOLLOW UP: No follow-ups on file.  Signed:  Crissie Sickles, MD           07/12/2020

## 2020-07-13 ENCOUNTER — Telehealth: Payer: Self-pay

## 2020-07-13 LAB — BASIC METABOLIC PANEL
BUN/Creatinine Ratio: 39 (calc) — ABNORMAL HIGH (ref 6–22)
BUN: 27 mg/dL — ABNORMAL HIGH (ref 7–25)
CO2: 23 mmol/L (ref 20–32)
Calcium: 9.9 mg/dL (ref 8.6–10.4)
Chloride: 103 mmol/L (ref 98–110)
Creat: 0.7 mg/dL (ref 0.50–1.05)
Glucose, Bld: 152 mg/dL — ABNORMAL HIGH (ref 65–99)
Potassium: 4.8 mmol/L (ref 3.5–5.3)
Sodium: 139 mmol/L (ref 135–146)

## 2020-07-13 LAB — CBC
HCT: 35.7 % (ref 35.0–45.0)
Hemoglobin: 11.7 g/dL (ref 11.7–15.5)
MCH: 27.5 pg (ref 27.0–33.0)
MCHC: 32.8 g/dL (ref 32.0–36.0)
MCV: 83.8 fL (ref 80.0–100.0)
MPV: 9.8 fL (ref 7.5–12.5)
Platelets: 473 10*3/uL — ABNORMAL HIGH (ref 140–400)
RBC: 4.26 10*6/uL (ref 3.80–5.10)
RDW: 14.1 % (ref 11.0–15.0)
WBC: 11.3 10*3/uL — ABNORMAL HIGH (ref 3.8–10.8)

## 2020-07-13 LAB — IRON,TIBC AND FERRITIN PANEL
%SAT: 10 % (calc) — ABNORMAL LOW (ref 16–45)
Ferritin: 26 ng/mL (ref 16–232)
Iron: 44 ug/dL — ABNORMAL LOW (ref 45–160)
TIBC: 447 mcg/dL (calc) (ref 250–450)

## 2020-07-13 LAB — MAGNESIUM: Magnesium: 1.8 mg/dL (ref 1.5–2.5)

## 2020-07-13 MED ORDER — VERAPAMIL HCL ER 240 MG PO TBCR: 120.0000 mg | EXTENDED_RELEASE_TABLET | Freq: Every day | ORAL | 6 refills | Status: DC

## 2020-07-13 NOTE — Telephone Encounter (Signed)
-----   Message from Tammi Sou, MD sent at 07/13/2020  8:05 AM EDT ----- All labs stable. Urine protein level same as 2 mo ago. The only thing new to do to try to slow this down is to increase her verapamil to 240 mg at bedtime. Pls do rx for verapamil 240 mg CR (Calan SR), 1 tab po qhs, #30, RF x 6. No change in lasix dosing at this time->continue 2 tabs in morning and 1 in evening. We'll repeat the urine protein test in 45mo.

## 2020-07-13 NOTE — Telephone Encounter (Signed)
Spoke with pt regarding labs and instructions.   

## 2020-07-14 ENCOUNTER — Telehealth: Payer: Self-pay | Admitting: Family Medicine

## 2020-07-14 ENCOUNTER — Other Ambulatory Visit: Payer: Self-pay

## 2020-07-14 MED ORDER — VERAPAMIL HCL ER 240 MG PO TBCR
240.0000 mg | EXTENDED_RELEASE_TABLET | Freq: Every day | ORAL | 6 refills | Status: DC
Start: 2020-07-14 — End: 2021-03-11

## 2020-07-14 NOTE — Telephone Encounter (Signed)
Pt was instructed to take whole tab. See phone note on results from 5/17. New rx will be sent and call [name] back to let her know regarding update.

## 2020-07-14 NOTE — Telephone Encounter (Signed)
[name]-[facility] is calling to get clarification on patients Verapamil. Prescription says 0.5 at bedtime, but patient states she's supposed to take the full 240 mg.  Please call her back at  [phone] and advise.

## 2020-07-14 NOTE — Telephone Encounter (Signed)
Spoke with Gloria Gomez at the pharmacy regarding rx sent with sig directions 0.5 qhs, pt has already picked up rx. Advised this would only be for 15 d supply since error on rx directions and remaining refills cancelled. New rx sent with correct sig directions, 1 tab po qd. LM for pt to return call regarding rx.

## 2020-07-15 ENCOUNTER — Other Ambulatory Visit: Payer: Self-pay | Admitting: Family Medicine

## 2020-07-16 NOTE — Progress Notes (Signed)
[facility] Note  07/23/2020     CHIEF COMPLAINT Patient presents for Retina Follow Up   HISTORY OF PRESENT ILLNESS: Gloria Gomez is a 50 y.o. female who presents to the clinic today for:   HPI    Retina Follow Up    Patient presents with  Diabetic Retinopathy.  In both eyes.  This started years ago.  Severity is moderate.  Duration of 5 weeks.  Since onset it is stable.  I, the attending physician,  performed the HPI with the patient and updated documentation appropriately.          Comments    50 y/o female pt here for 5 wk f/u for PDR OU.  No change in [place] OU.  Denies pain, FOL, floaters.  No gtts.  BS 103 this a.m.  Last A1C 9.2.  Pt has had unexplained excessive bloating, weight gain and fatigue over the last 3 mos.  Other doctors currently do not have an explanation for symptoms.  Pt awaiting insurance approval for further testing.  BP has been doing well.       Last edited by Bernarda Caffey, MD on 07/23/2020  3:18 PM. (History)    pt states vision seems stable, she has had unexpected weight gain, fatigue and excessive swelling so she is on 44m of lasix   Referring physician:   HISTORICAL INFORMATION:   Selected notes from the MEDICAL RECORD NUMBER Referred for DM exam   CURRENT MEDICATIONS: No current outpatient medications on file. (Ophthalmic Drugs)   No current facility-administered medications for this visit. (Ophthalmic Drugs)   Current Outpatient Medications (Other)  Medication Sig  . amLODipine (NORVASC) 5 MG tablet Take 1 tablet by mouth once daily  . furosemide (LASIX) 20 MG tablet 2 tabs po qAM and 1 tab po qPM  . HYDROcodone-acetaminophen (NORCO/VICODIN) 5-325 MG tablet Take 1-2 tablets by mouth every 6 (six) hours as needed for moderate pain. (Patient not taking: No sig reported)  . insulin glargine (LANTUS SOLOSTAR) 100 UNIT/ML Solostar Pen 65-70 units SQ qhs, patient will be gradually titrating this dose to get to target  fasting glucose range of 100-110.  . liraglutide (VICTOZA) 18 MG/3ML SOPN INJECT 1.2MG INTO THE SKIN DAILY  . losartan (COZAAR) 100 MG tablet Take 1 tablet by mouth once daily  . metFORMIN (GLUCOPHAGE) 1000 MG tablet Take 1 tablet (1,000 mg total) by mouth 2 (two) times daily with a meal.  . metoCLOPramide (REGLAN) 5 MG tablet TAKE 1 TABLET BY MOUTH 4 TIMES DAILY BEFORE MEAL(S) AND AT BEDTIME  . metoprolol succinate (TOPROL-XL) 100 MG 24 hr tablet 2 tabs po qd  . Multiple Vitamin (MULTIVITAMIN WITH MINERALS) TABS tablet Take 1 tablet by mouth daily.  .Marland KitchenOVER THE COUNTER MEDICATION Take 2 capsules by mouth daily. Neu Remedy  . pantoprazole (PROTONIX) 40 MG tablet Take 1 tablet by mouth once daily  . promethazine (PHENERGAN) 12.5 MG tablet 1-2 tabs po q6h prn nausea (Patient not taking: No sig reported)  . rosuvastatin (CRESTOR) 10 MG tablet Take 1 tablet by mouth once daily  . verapamil (CALAN-SR) 240 MG CR tablet Take 1 tablet (240 mg total) by mouth at bedtime.   No current facility-administered medications for this visit. (Other)      REVIEW OF SYSTEMS: ROS    Positive for: Endocrine, Eyes   Negative for: Constitutional, Gastrointestinal, Neurological, Skin, Genitourinary, Musculoskeletal, HENT, Cardiovascular, Respiratory, Psychiatric, Allergic/Imm, Heme/Lymph   Last edited by BEstill Bakes  G, COA on 07/23/2020  2:47 PM. (History)       ALLERGIES Allergies  Allergen Reactions  . Gluten Meal Swelling  . Lisinopril Cough  . Pioglitazone Other (See Comments)    ELEVATED glucoses + worse chronic nausea    PAST MEDICAL HISTORY Past Medical History:  Diagnosis Date  . Abdominal bloating    likely from diab gastroparesis.  Dr. Havery Moros started trial of reglan 03/2019.  Marland Kitchen Benign brain tumor ([place])    Cystic lesion in cerebral aqueduct region with mild hydrocephalus-- stable MRI 02/2016.  Surveillance MRI 05/2017 --dilated cerebral aqueduct related to aqueductal stenosis and  subsequent mild hydrocephalus (due to the 11 mm stable cystic lesion in cerebral aqueduct---?congenitial?.  . Cataract    OU  . Dysmenorrhea    vicodin occ during first 2 days of cycle.  . Gluten intolerance    pt reports she underwent full GI w/u to r/o celiac dz  . Hepatic steatosis    ultrasound 08/2017. Hx of very mild elevation of ALT.  Stable on u/s 06/2020  . History of adenomatous polyp of colon 04/08/2019   recall Feb 2024  . Hyperlipidemia, mixed   . Hypertensive retinopathy    OU  . Hypertensive retinopathy of both eyes   . Insomnia   . Iron deficiency 01/2019   Hb 11.3. Hemoccults neg x 3 03/19/19. EGD and colonoscopy 04/08/19 showed NO cause for IDA.  Pt does have menorrhagia, though, so she'll see her GYN.  started FeSO4 325 qd approx 04/14/19.  . Menorrhagia    resulting in [place] 2021  . Proliferative diabetic retinopathy of both eyes ([place])    steroid injections 10/2017--improved  . Sensorineural hearing loss of left ear    Sudden left hearing loss summer 2016--no improvement with steroids 01/2015 so brain MRI done by Dr. Redmond Baseman and it showed brain tumor that was determined to be benign.  Pt's hearing not bad enough for hearing aid as of 06/2016.  . Type 2 diabetes with complication (HCC)    +microalbuminuria, diab retpthy, diabetic gastroparesis (gastric emptying study mildly abnl 03/2017).  Recommended lantus 08/2018 but pt declined. Mild microalbuminuria.  Marland Kitchen Uterine fibroid   . White coat hypertension    Past Surgical History:  Procedure Laterality Date  . ANOSCOPY  05/12/2019   Procedure: normal exam, minimal hemorrhoid disease. Hyertrophied anal papila, benign appearing, posterior midline. Surgeon: Leighton Ruff MD  . CHOLECYSTECTOMY  2000  . COLONOSCOPY  04/08/2019   5 adenomas, recall 3 yrs; no cause for IDA found.  Hypertrophied anal papillae->bx showed low grade dysplasia; GI referred her to colorectal surgeon.  . ESOPHAGOGASTRODUODENOSCOPY  04/08/2019   mild chronic  reactive gastritis. H pylori NEG.  No cause for IDA found.  . GAS INSERTION Right 10/31/2018   Procedure: Insertion Of C3F8 Gas;  Surgeon: Bernarda Caffey, MD;  Location: [facility];  Service: Ophthalmology;  Laterality: Right;  . GASTRIC EMPTYING SCAN  04/20/2017   Mildly abnormal, particularly the 1st hour of emptying.  Marland Kitchen MEMBRANE PEEL Right 10/31/2018   Procedure: MEMBRANE PEEL;  Surgeon: Bernarda Caffey, MD;  Location: [facility];  Service: Ophthalmology;  Laterality: Right;  . PARS PLANA VITRECTOMY Right 04/12/2018   Procedure: Right PARS PLANA VITRECTOMY WITH 25 GAUGE with intravitreal antibiotics;  Surgeon: Bernarda Caffey, MD;  Location: [facility];  Service: Ophthalmology;  Laterality: Right;  . PARS PLANA VITRECTOMY Right 10/31/2018   Procedure: PARS PLANA VITRECTOMY WITH 25 GAUGE;  Surgeon: Bernarda Caffey, MD;  Location[facility]  OR;  Service: Ophthalmology;  Laterality: Right;  . PHOTOCOAGULATION WITH LASER Right 10/31/2018   Procedure: Photocoagulation With Laser;  Surgeon: Bernarda Caffey, MD;  Location: [facility];  Service: Ophthalmology;  Laterality: Right;    FAMILY HISTORY Family History  Problem Relation Age of Onset  . Brain cancer Mother   . Diabetes Father   . Diabetes Maternal Grandmother   . Cataracts Maternal Grandmother   . Cervical cancer Paternal Grandmother   . Colon cancer Maternal Grandfather 51  . Amblyopia Neg Hx   . Blindness Neg Hx   . Glaucoma Neg Hx   . Macular degeneration Neg Hx   . Retinal detachment Neg Hx   . Strabismus Neg Hx   . Retinitis pigmentosa Neg Hx   . Esophageal cancer Neg Hx   . Stomach cancer Neg Hx   . Rectal cancer Neg Hx     SOCIAL HISTORY Social History   Tobacco Use  . Smoking status: Never Smoker  . Smokeless tobacco: Never Used  Vaping Use  . Vaping Use: Never used  Substance Use Topics  . Alcohol use: No  . Drug use: No         OPHTHALMIC EXAM:  Base Eye Exam    Visual Acuity (Snellen - Linear)      Right Left   Dist [place] 20/30 +2    Dist cc   20/20 -2   Dist ph [place] NI        Tonometry (Tonopen, 2:49 PM)      Right Left   Pressure 14 17       Pupils      Dark Light Shape React APD   Right 2 1 Round Minimal None   Left 2 1 Round Minimal None       Visual Fields (Counting fingers)      Left Right    Full Full       Extraocular Movement      Right Left    Full, Ortho Full, Ortho       Neuro/Psych    Oriented x3: Yes   Mood/Affect: Normal       Dilation    Both eyes: 1.0% Mydriacyl, 2.5% Phenylephrine @ 2:49 PM        Slit Lamp and Fundus Exam    Slit Lamp Exam      Right Left   Lids/Lashes Dermatochalasis - upper lid, mild Meibomian gland dysfunction, Telangiectasia Dermatochalasis - upper lid, Meibomian gland dysfunction, Telangiectasia   Conjunctiva/Sclera White and quiet, sutures intact White and quiet   Cornea Clear, trace Punctate epithelial erosions, well healed temporal cataract wounds Trace Punctate epithelial erosions   Anterior Chamber Deep and quiet Deep and quiet   Iris Round and dilated, No NVI Round and dilated, No NVI   Lens PC IOL in good position, 1-2+ Posterior capsular opacification, PC folds 2-3+ Nuclear sclerosis, 2-3+ Cortical cataract   Vitreous post vitrectomy, trace pigment Vitreous syneresis, blood stained vitreous condensations improving centrally and settling inferiorly, residual blood clots inferiorly - improving, persistent blood stained floater just IN to disc - improving       Fundus Exam      Right Left   Disc mild Pallor, Sharp rim, temporal PPA Pink and sharp, Compact, PPA   C/D Ratio 0.2 0.0   Macula Flat, good foveal reflex, mild cystic changes ST to fovea -- persistent, persistent punctate exudate, scattered MA ST mac -- improved Flat, good foveal reflex, trace cystic changes --  improved, scattered MA; trace ERM, light focal laser changes   Vessels attenuated, Tortuous attenuated, mild Tortuousity   Periphery Attached, scattered DBH greatest superiorly, 360 PRP, good  laser fill in 360 attached, scattered IRH; 360 PRP scars -- with good early fill in changes, inferior periphery obscured by persistent VH          IMAGING AND PROCEDURES  Imaging and Procedures for _0 @  OCT, Retina - OU - Both Eyes       Right Eye Quality was good. Central Foveal Thickness: 351. Progression has been stable. Findings include abnormal foveal contour, epiretinal membrane, no SRF, intraretinal hyper-reflective material, intraretinal fluid (Persistent IRF/IRHM temporal macula).   Left Eye Quality was good. Central Foveal Thickness: 290. Progression has been stable. Findings include normal foveal contour, intraretinal fluid, no SRF, intraretinal hyper-reflective material (Interval improvement in temporal, non-central IRF -- best seen on widefield).   Notes *Images captured and stored on drive  Diagnosis / Impression:  DME OU OD: Persistent IRF/IRHM temporal macula OS: Interval improvement in temporal, non-central IRF -- best seen on widefield  Clinical management:  See below  Abbreviations: NFP - Normal foveal profile. CME - cystoid macular edema. PED - pigment epithelial detachment. IRF - intraretinal fluid. SRF - subretinal fluid. EZ - ellipsoid zone. ERM - epiretinal membrane. ORA - outer retinal atrophy. ORT - outer retinal tubulation. SRHM - subretinal hyper-reflective material         Intravitreal Injection, Pharmacologic Agent - OD - Right Eye       Time Out 07/23/2020. 3:03 PM. Confirmed correct patient, procedure, site, and patient consented.   Anesthesia Topical anesthesia was used. Anesthetic medications included Lidocaine 2%, Proparacaine 0.5%.   Procedure Preparation included 5% betadine to ocular surface, eyelid speculum. A (32g) needle was used.   Injection:  2 mg aflibercept Alfonse Flavors) SOLN   NDC: A3590391, Lot: [id number], Expiration date: 04/26/2021   Route: Intravitreal, Site: Right Eye, Waste: 0.05 mL  Post-op Post injection  exam found visual acuity of at least counting fingers. The patient tolerated the procedure well. There were no complications. The patient received written and verbal post procedure care education. Post injection medications were not given.                ASSESSMENT/PLAN:    ICD-10-CM   1. Proliferative diabetic retinopathy of both eyes with macular edema associated with type 2 diabetes mellitus (HCC)  Z30.8657 Intravitreal Injection, Pharmacologic Agent - OD - Right Eye    aflibercept (EYLEA) SOLN 2 mg  2. Retinal edema  H35.81 OCT, Retina - OU - Both Eyes  3. Vitreous hemorrhage of left eye (HCC)  H43.12   4. Right endophthalmia  H44.001   5. Vitreous hemorrhage, right eye ([place])  H43.11   6. Essential hypertension  I10   7. Hypertensive retinopathy of both eyes  H35.033   8. Combined forms of age-related cataract of left eye  H25.812   9. Pseudophakia of right eye  Z96.1    1,2.  Proliferative diabetic retinopathy w/ DME, OU  - HbA1c 9.1% (02.24.22) 11.0% (11.22.21), 8.3% (03.31.21), 12.2.20), 8.6% (06.30.20), 8.4% (02.14.20)  - s/p IVA OD #1 9.20.19, #2 (10.25.19), #3 (11.15.19), #4 (12.17.19), #5 (01.14.20), #6 (2.11.20), #7 (05.29.20), #8 (08.19.20), #9 (10.30.20), #10 (12.09.20), #11 (01.11.21), #12 (02.15.21), #13 (03.23.21), #14 (04.20.21), #15 (06.08.21), #16 (07.06.21)  - s/p IVA OS #1 9.27.19, #2 (10.25.19), #3 (11.15.19), #4 (12.17.19), #5 (01.14.20), #6 (2.11.20), #7 (04.26.20), #8 (05.29.20), #9 (06.26.20), #10 (08.05.20), #11 (  11.11.20), #12 (12.09.20), #13 (01.11.21), #14 (02.15.21), #15 (03.23.21), #16 (04.20.21), #17 (06.08.21) -- IVA resistance, #18 (09.27.21)  - s/p IVE OD #1 (08.03.21) -- sample, #2 (09.10.21), #3 (10.08.21), #4 (11.23.21), #5 (12.21.21), #6 (01.19.22) sample, #7 (2.16.22), #8 (3.22.22), #9 (04.19.22)  - s/p IVE OS #1 (10.25.21), #2 (11.23.21), #3 (12.21.21), #4 (3.22.22)  - S/P PRP OS (09.20.19), (5.19.20), (08.19.20), (04.28.21), (02.02.22)  - S/P  PRP OD (9.27.19 and 11.21.19), fill-in (04.14.20) (09.03.20, surgery)  - S/P focal laser OS (07.06.21)  - FA (9.20.19) shows +NVE OU and leaking MA and capillary nonperfusion  - repeat FA 11.15.19 shows NV regressing OU  - pre-op: OD w/ VA stable at 20/25, but there is some preretinal fibrosis / tractional membranes just superior to disc and mild central DME  - s/p 25g PPV+MP+10% C3F8 gas OD (09.03.20) -- ERM/PRF removal OD  - BCVA stable at 20/30 OD; 20/20 OS             - fibrosis/ERM stably improved; retina attached  - OCT shows OD: interval improvement in cystic changes temporal macula; OS: mild interval improvement in IRF, just trace cystic changes remain  - recommend IVE OD #10 today, 05.27.22 -- will hold OS  - pt in agreement  - RBA of procedure discussed, questions answered  - informed consent obtained   - see procedure note  - Avastin informed consent form signed and scanned on 01.11.2021 (OU)  - Eylea informed consent form signed and scanned on 08.03.2021  - [email or web address] benefits investigation started, 08.03.21 -- approved as of 09.10.21 -- 2022 insurance verified as of 02.16.22  - f/u 4-5 weeks, DFE, OCT, possible injection(s)  3. Vitreous hemorrhage OS -- recurrent, improving  - recurrent VH, onset 09.22.21  - etiology: secondary to PDR as described above (no RT/RD on exam)  - s/p PRP OS (9.20.19), (05.19.20), (08.19.20), (04.28.21), (02.02.22)  - s/p IVA OS on 4.26.20, 5.29.20, 6.26.20, 8.5.20,11.11.20, 12.06.20 and so on as above  - VH clear centrally and settled inferiorly   - BCVA improved to 20/20 from 20/30   - f/u 5 weeks DFE, OCT, possible injection  4. History of Endophthalmitis OD  - s/p IVA OU 04/09/2018  - s/p 25g PPV w/ intravitreal vanc, ceftaz and cefepime OD, 2.14.2020  - s/p intravitreal tap / vanc and ceflaz injections (02.16.20)             - gram stain (2.14.20) shows G+ cocci, WBCs mostly PMNs;   - repeat gram stain from t/i (2.16.20) -- no organisms, just  WBCs             - cultures from vitreous grew rare Staph warneri; cultures from t/i -- no growth             - doing well, BCVA 20/25             - inflammation/posterior debris resolved             - IOP 14 off Brimonidine  - completed po pred taper -- caused significant elevations in BG  - monitor   5. History of Vitreous Hemorrhage OD -- cleared from PPV x2 for endophthalmitis and ERM/preretinal fibrosis  - secondary to PDR as described below  - S/P IVA OD #1 (09.20.19), #2 (10.25.19), #3 (11.15.19), #4 (12.16.19), #5 (03/12/2018)  - S/P PRP OD #1 (09.27.19), fill in OD (11.21.19) -- each somewhat limited inferiorly by residual VH  6,7. Hypertensive retinopathy OU  - BP elevated today (9.27.21) --  164/91, left arm  - discussed importance of tight BP control.  - monitor   8. Combined form age related cataract OS-   - The symptoms of cataract, surgical options, and treatments and risks were discussed with patient.  - discussed diagnosis and progression   9. Pseudophakia OD  - s/p CE/IOL OD (Dr. Zenia Resides, 12.11.20)  - beautiful surgery, doing well  Ophthalmic Meds Ordered this visit:  Meds ordered this encounter  Medications  . aflibercept (EYLEA) SOLN 2 mg      Return for f/u 4-5 weeks, PDR OU, DFE, OCT.  There are no Patient Instructions on file for this visit.  This document serves as a record of services personally performed by Gardiner Sleeper, MD, PhD. It was created on their behalf by Leonie Douglas, an ophthalmic technician. The creation of this record is the provider's dictation and/or activities during the visit.    Electronically signed by: Leonie Douglas COA, 07/23/20  3:22 PM   This document serves as a record of services personally performed by Gardiner Sleeper, MD, PhD. It was created on their behalf by San Jetty. Owens Shark, OA an ophthalmic technician. The creation of this record is the provider's dictation and/or activities during the visit.    Electronically signed  by: San Jetty. Owens Shark, [place] 05.27.2022 3:22 PM  Gardiner Sleeper, M.D., Ph.D. Diseases & Surgery of the Retina and [place] 07/23/2020   I have reviewed the above documentation for accuracy and completeness, and I agree with the above. Gardiner Sleeper, M.D., Ph.D. 07/23/20 3:22 PM    Abbreviations: M myopia (nearsighted); A astigmatism; H hyperopia (farsighted); P presbyopia; Mrx spectacle prescription;  CTL contact lenses; OD right eye; OS left eye; OU both eyes  XT exotropia; ET esotropia; PEK punctate epithelial keratitis; PEE punctate epithelial erosions; DES dry eye syndrome; MGD meibomian gland dysfunction; ATs artificial tears; PFAT's preservative free artificial tears; [place] nuclear sclerotic cataract; PSC posterior subcapsular cataract; ERM epi-retinal membrane; PVD posterior vitreous detachment; RD retinal detachment; DM diabetes mellitus; DR diabetic retinopathy; NPDR non-proliferative diabetic retinopathy; PDR proliferative diabetic retinopathy; CSME clinically significant macular edema; DME diabetic macular edema; dbh dot blot hemorrhages; CWS cotton wool spot; POAG primary open angle glaucoma; C/D cup-to-disc ratio; HVF humphrey visual field; GVF goldmann visual field; OCT optical coherence tomography; IOP intraocular pressure; BRVO Branch retinal vein occlusion; CRVO central retinal vein occlusion; CRAO central retinal artery occlusion; BRAO branch retinal artery occlusion; RT retinal tear; SB scleral buckle; PPV pars plana vitrectomy; VH Vitreous hemorrhage; PRP panretinal laser photocoagulation; IVK intravitreal kenalog; VMT vitreomacular traction; MH Macular hole;  NVD neovascularization of the disc; NVE neovascularization elsewhere; AREDS age related eye disease study; ARMD age related macular degeneration; POAG primary open angle glaucoma; EBMD epithelial/anterior basement membrane dystrophy; ACIOL anterior chamber intraocular lens; IOL intraocular lens;  PCIOL posterior chamber intraocular lens; Phaco/IOL phacoemulsification with intraocular lens placement; [place] photorefractive keratectomy; LASIK laser assisted in situ keratomileusis; HTN hypertension; DM diabetes mellitus; COPD chronic obstructive pulmonary disease

## 2020-07-20 ENCOUNTER — Ambulatory Visit: Payer: 59 | Admitting: Family Medicine

## 2020-07-20 NOTE — Progress Notes (Deleted)
OFFICE VISIT  07/20/2020  CC: No chief complaint on file.  HPI:    Patient is a 50 y.o. Caucasian female who presents for f/u DM 2, fluid retention, HTN, HLD. A/P as of last visit: "1) Fluid retention: unclear etiology. Liver/biliary system looked fine on u/s. Awaiting scheduling of echocardiogram. This condition is improved with inc dosing of lasix since last visit. Wt loss 4 lbs. In 10d. Given her hx of albuminuria I'll recheck microalb/cr today.  Her serum protein/albumin levels have been consistently normal. No changes made today.  Continue low Na intake. Await labs from today to see if lasix dose can be continued as-is.  2) DM: better control on lantus as she has titrated this up. Next a1c due in 2 wks or so. If still >8 then will talk to her about adding mealtime insulin. Cont metformin 1000mg  bid and victiza 1.2mg  qd.  3) Diabetic gastroparesis: pretty stable, though not ideal control overall. Cont reglan 5mg  qid prn. She has not seen Dr. Havery Moros, her GI MD, since 02/2019 so it would be good for her to f/u with him in the near future."  INTERIM HX: *** 07/12/20 (8 days ago) lab result note by me:  "All labs stable.  Urine protein level same as 2 mo ago. The only thing new to do to try to slow this down is to increase her verapamil to 240 mg at bedtime. Pls do rx for verapamil 240 mg CR (Calan SR), 1 tab po qhs, #30, RF x 6. No change in lasix dosing at this time->continue 2 20mg  tabs in morning and 1 in evening. We'll repeat the urine protein test in 24mo."  Fluid retention:  DM (with albuminuria, retinopathy and gastroparesis): on lantus *** units/day, victoza 1.2mg  qd, and metformin 1000 bid.  HTN:   Past Medical History:  Diagnosis Date  . Abdominal bloating    likely from diab gastroparesis.  Dr. Havery Moros started trial of reglan 03/2019.  Marland Kitchen Benign brain tumor ([place])    Cystic lesion in cerebral aqueduct region with mild hydrocephalus-- stable MRI 02/2016.   Surveillance MRI 05/2017 --dilated cerebral aqueduct related to aqueductal stenosis and subsequent mild hydrocephalus (due to the 11 mm stable cystic lesion in cerebral aqueduct---?congenitial?.  . Cataract    OU  . Dysmenorrhea    vicodin occ during first 2 days of cycle.  . Gluten intolerance    pt reports she underwent full GI w/u to r/o celiac dz  . Hepatic steatosis    ultrasound 08/2017. Hx of very mild elevation of ALT.  Stable on u/s 06/2020  . History of adenomatous polyp of colon 04/08/2019   recall Feb 2024  . Hyperlipidemia, mixed   . Hypertensive retinopathy    OU  . Hypertensive retinopathy of both eyes   . Insomnia   . Iron deficiency 01/2019   Hb 11.3. Hemoccults neg x 3 03/19/19. EGD and colonoscopy 04/08/19 showed NO cause for IDA.  Pt does have menorrhagia, though, so she'll see her GYN.  started FeSO4 325 qd approx 04/14/19.  . Menorrhagia    resulting in [place] 2021  . Proliferative diabetic retinopathy of both eyes ([place])    steroid injections 10/2017--improved  . Sensorineural hearing loss of left ear    Sudden left hearing loss summer 2016--no improvement with steroids 01/2015 so brain MRI done by Dr. Redmond Baseman and it showed brain tumor that was determined to be benign.  Pt's hearing not bad enough for hearing aid as of 06/2016.  Marland Kitchen  Type 2 diabetes with complication (HCC)    +microalbuminuria, diab retpthy, diabetic gastroparesis (gastric emptying study mildly abnl 03/2017).  Recommended lantus 08/2018 but pt declined. Mild microalbuminuria.  Marland Kitchen Uterine fibroid   . White coat hypertension     Past Surgical History:  Procedure Laterality Date  . ANOSCOPY  05/12/2019   Procedure: normal exam, minimal hemorrhoid disease. Hyertrophied anal papila, benign appearing, posterior midline. Surgeon: Leighton Ruff MD  . CHOLECYSTECTOMY  2000  . COLONOSCOPY  04/08/2019   5 adenomas, recall 3 yrs; no cause for IDA found.  Hypertrophied anal papillae->bx showed low grade dysplasia; GI  referred her to colorectal surgeon.  . ESOPHAGOGASTRODUODENOSCOPY  04/08/2019   mild chronic reactive gastritis. H pylori NEG.  No cause for IDA found.  . GAS INSERTION Right 10/31/2018   Procedure: Insertion Of C3F8 Gas;  Surgeon: Bernarda Caffey, MD;  Location: [place];  Service: Ophthalmology;  Laterality: Right;  . GASTRIC EMPTYING SCAN  04/20/2017   Mildly abnormal, particularly the 1st hour of emptying.  Marland Kitchen MEMBRANE PEEL Right 10/31/2018   Procedure: MEMBRANE PEEL;  Surgeon: Bernarda Caffey, MD;  Location: [place];  Service: Ophthalmology;  Laterality: Right;  . PARS PLANA VITRECTOMY Right 04/12/2018   Procedure: Right PARS PLANA VITRECTOMY WITH 25 GAUGE with intravitreal antibiotics;  Surgeon: Bernarda Caffey, MD;  Location: [place];  Service: Ophthalmology;  Laterality: Right;  . PARS PLANA VITRECTOMY Right 10/31/2018   Procedure: PARS PLANA VITRECTOMY WITH 25 GAUGE;  Surgeon: Bernarda Caffey, MD;  Location: [place];  Service: Ophthalmology;  Laterality: Right;  . PHOTOCOAGULATION WITH LASER Right 10/31/2018   Procedure: Photocoagulation With Laser;  Surgeon: Bernarda Caffey, MD;  Location: [place];  Service: Ophthalmology;  Laterality: Right;    Outpatient Medications Prior to Visit  Medication Sig Dispense Refill  . amLODipine (NORVASC) 5 MG tablet Take 1 tablet by mouth once daily 90 tablet 0  . furosemide (LASIX) 20 MG tablet 2 tabs po qAM and 1 tab po qPM 90 tablet 1  . HYDROcodone-acetaminophen (NORCO/VICODIN) 5-325 MG tablet Take 1-2 tablets by mouth every 6 (six) hours as needed for moderate pain. (Patient not taking: No sig reported) 40 tablet 0  . insulin glargine (LANTUS SOLOSTAR) 100 UNIT/ML Solostar Pen 65-70 units SQ qhs, patient will be gradually titrating this dose to get to target fasting glucose range of 100-110. 15 mL 3  . liraglutide (VICTOZA) 18 MG/3ML SOPN INJECT 1.2MG  INTO THE SKIN DAILY 18 mL 0  . losartan (COZAAR) 100 MG tablet Take 1 tablet by mouth once daily 90 tablet 0  . metFORMIN  (GLUCOPHAGE) 1000 MG tablet Take 1 tablet (1,000 mg total) by mouth 2 (two) times daily with a meal. 180 tablet 3  . metoCLOPramide (REGLAN) 5 MG tablet TAKE 1 TABLET BY MOUTH 4 TIMES DAILY BEFORE MEAL(S) AND AT BEDTIME 120 tablet 0  . metoprolol succinate (TOPROL-XL) 100 MG 24 hr tablet 2 tabs po qd 60 tablet 6  . Multiple Vitamin (MULTIVITAMIN WITH MINERALS) TABS tablet Take 1 tablet by mouth daily.    Marland Kitchen OVER THE COUNTER MEDICATION Take 2 capsules by mouth daily. Neu Remedy    . pantoprazole (PROTONIX) 40 MG tablet Take 1 tablet by mouth once daily 90 tablet 0  . promethazine (PHENERGAN) 12.5 MG tablet 1-2 tabs po q6h prn nausea (Patient not taking: No sig reported) 30 tablet 1  . rosuvastatin (CRESTOR) 10 MG tablet Take 1 tablet by mouth once daily 30 tablet 0  . verapamil (CALAN-SR)  240 MG CR tablet Take 1 tablet (240 mg total) by mouth at bedtime. 30 tablet 6   No facility-administered medications prior to visit.    Allergies  Allergen Reactions  . Gluten Meal Swelling  . Lisinopril Cough  . Pioglitazone Other (See Comments)    ELEVATED glucoses + worse chronic nausea    ROS As per HPI  PE: Vitals with BMI 07/12/2020 07/02/2020 04/22/2020  Height 5\' 3"  5\' 3"  5\' 3"   Weight 184 lbs 13 oz 188 lbs 13 oz 176 lbs 3 oz  BMI 32.74 59.56 38.75  Systolic 643 329 518  Diastolic 71 75 84  Pulse 88 84 82     ***  LABS:  Lab Results  Component Value Date   TSH 4.69 (H) 07/02/2020   Lab Results  Component Value Date   WBC 11.3 (H) 07/12/2020   HGB 11.7 07/12/2020   HCT 35.7 07/12/2020   MCV 83.8 07/12/2020   PLT 473 (H) 07/12/2020   Lab Results  Component Value Date   IRON 44 (L) 07/12/2020   TIBC 447 07/12/2020   FERRITIN 26 07/12/2020   Lab Results  Component Value Date   VITAMINB12 967 01/29/2019    Lab Results  Component Value Date   CREATININE 0.70 07/12/2020   BUN 27 (H) 07/12/2020   NA 139 07/12/2020   K 4.8 07/12/2020   CL 103 07/12/2020   CO2 23  07/12/2020   Lab Results  Component Value Date   ALT 61 (H) 07/02/2020   AST 34 07/02/2020   ALKPHOS 72 07/02/2020   BILITOT 0.2 07/02/2020   Lab Results  Component Value Date   CHOL 141 04/22/2020   Lab Results  Component Value Date   HDL 43.20 04/22/2020   Lab Results  Component Value Date   LDLCALC 58 04/22/2020   Lab Results  Component Value Date   TRIG 195.0 (H) 04/22/2020   Lab Results  Component Value Date   CHOLHDL 3 04/22/2020   Lab Results  Component Value Date   HGBA1C 9.6 (H) 04/22/2020   IMPRESSION AND PLAN:  1) DM: with albuminuria, diab retinopathy, and diab gastroparesis. Control complicated by activity limitations (sometimes complete bedrest) by opth d/t her diab retinopathy + procedures. Hba1c today.  2) HTN Lytes/cr today.  3) HLD: LDL goal <70.  It was 58 three mo ago. Cont crestor 10mg  qd. Plan rpt FLP and hepatic panel 3 mo.  4) Fluid retention:  unclear etiology. Liver/biliary system looked fine on u/s.  Serum protein normal. Awaiting scheduling of echocardiogram. Lasix dosing: *** BMET today.  5) NASH: documented fatty liver on u/s 07/07/20 + mild chronic AST and ALT elevation. Monitor transaminases again in 3 mo. Checking chronic hep serologies today.  An After Visit Summary was printed and given to the patient.  FOLLOW UP: No follow-ups on file.  Signed:  Crissie Sickles, MD           07/20/2020

## 2020-07-21 ENCOUNTER — Telehealth: Payer: Self-pay

## 2020-07-21 NOTE — Telephone Encounter (Signed)
PA sent via covermymed on 07/21/20  Key: [id number]   Medication:  Lantus SoloStar 100UNIT/ML pen-injectors   Dx: G90.301, Z79.4   Per Dr. Anitra Lauth pt has tried and failed N/A   Waiting for response.    Outcome: Additional Information Required Member should be able to get the drug/product without a PA at this time.   Spoke with Donnetta Simpers at the pharmacy to provide update, rx went thru for no charge.

## 2020-07-22 ENCOUNTER — Other Ambulatory Visit: Payer: Self-pay | Admitting: Radiology

## 2020-07-22 DIAGNOSIS — R140 Abdominal distension (gaseous): Secondary | ICD-10-CM

## 2020-07-23 ENCOUNTER — Ambulatory Visit (INDEPENDENT_AMBULATORY_CARE_PROVIDER_SITE_OTHER): Payer: 59 | Admitting: Ophthalmology

## 2020-07-23 ENCOUNTER — Other Ambulatory Visit: Payer: Self-pay

## 2020-07-23 ENCOUNTER — Encounter (INDEPENDENT_AMBULATORY_CARE_PROVIDER_SITE_OTHER): Payer: Self-pay | Admitting: Ophthalmology

## 2020-07-23 DIAGNOSIS — H25812 Combined forms of age-related cataract, left eye: Secondary | ICD-10-CM

## 2020-07-23 DIAGNOSIS — E113513 Type 2 diabetes mellitus with proliferative diabetic retinopathy with macular edema, bilateral: Secondary | ICD-10-CM

## 2020-07-23 DIAGNOSIS — H3581 Retinal edema: Secondary | ICD-10-CM

## 2020-07-23 DIAGNOSIS — H44001 Unspecified purulent endophthalmitis, right eye: Secondary | ICD-10-CM | POA: Diagnosis not present

## 2020-07-23 DIAGNOSIS — H4312 Vitreous hemorrhage, left eye: Secondary | ICD-10-CM

## 2020-07-23 DIAGNOSIS — H4311 Vitreous hemorrhage, right eye: Secondary | ICD-10-CM

## 2020-07-23 DIAGNOSIS — Z961 Presence of intraocular lens: Secondary | ICD-10-CM

## 2020-07-23 DIAGNOSIS — H35033 Hypertensive retinopathy, bilateral: Secondary | ICD-10-CM

## 2020-07-23 DIAGNOSIS — I10 Essential (primary) hypertension: Secondary | ICD-10-CM

## 2020-07-23 MED ORDER — AFLIBERCEPT 2MG/0.05ML IZ SOLN FOR KALEIDOSCOPE
2.0000 mg | INTRAVITREAL | Status: AC | PRN
  Administered 2020-07-23: 2 mg via INTRAVITREAL

## 2020-07-27 ENCOUNTER — Other Ambulatory Visit: Payer: Self-pay | Admitting: Family Medicine

## 2020-07-27 DIAGNOSIS — K3184 Gastroparesis: Secondary | ICD-10-CM

## 2020-08-02 ENCOUNTER — Other Ambulatory Visit: Payer: Self-pay | Admitting: Family Medicine

## 2020-08-09 ENCOUNTER — Ambulatory Visit (INDEPENDENT_AMBULATORY_CARE_PROVIDER_SITE_OTHER): Payer: 59 | Admitting: Family Medicine

## 2020-08-09 ENCOUNTER — Encounter: Payer: Self-pay | Admitting: Family Medicine

## 2020-08-09 ENCOUNTER — Other Ambulatory Visit: Payer: Self-pay

## 2020-08-09 VITALS — BP 111/65 | HR 86 | Temp 98.0°F | Resp 16 | Ht 63.0 in | Wt 190.8 lb

## 2020-08-09 DIAGNOSIS — I10 Essential (primary) hypertension: Secondary | ICD-10-CM | POA: Diagnosis not present

## 2020-08-09 DIAGNOSIS — E1129 Type 2 diabetes mellitus with other diabetic kidney complication: Secondary | ICD-10-CM | POA: Diagnosis not present

## 2020-08-09 DIAGNOSIS — E7800 Pure hypercholesterolemia, unspecified: Secondary | ICD-10-CM

## 2020-08-09 DIAGNOSIS — E118 Type 2 diabetes mellitus with unspecified complications: Secondary | ICD-10-CM

## 2020-08-09 DIAGNOSIS — R809 Proteinuria, unspecified: Secondary | ICD-10-CM | POA: Diagnosis not present

## 2020-08-09 DIAGNOSIS — R609 Edema, unspecified: Secondary | ICD-10-CM

## 2020-08-09 LAB — BASIC METABOLIC PANEL
BUN: 20 mg/dL (ref 6–23)
CO2: 27 mEq/L (ref 19–32)
Calcium: 9.9 mg/dL (ref 8.4–10.5)
Chloride: 104 mEq/L (ref 96–112)
Creatinine, Ser: 0.64 mg/dL (ref 0.40–1.20)
GFR: 102.11 mL/min (ref >60.00)
Glucose, Bld: 112 mg/dL — ABNORMAL HIGH (ref 70–99)
Potassium: 4.6 mEq/L (ref 3.5–5.1)
Sodium: 142 mEq/L (ref 135–145)

## 2020-08-09 LAB — HEMOGLOBIN A1C: Hgb A1c MFr Bld: 7.8 % — ABNORMAL HIGH (ref 4.6–6.5)

## 2020-08-09 NOTE — Progress Notes (Signed)
OFFICE VISIT  08/09/2020  CC:  Chief Complaint  Patient presents with   Follow-up    RCI; 4 weeks.    HPI:    Patient is a 50 y.o. Caucasian female who presents for 4 wk f/u fluid retention, DM, HTN, HLD. A/P as of last visit: "1) Fluid retention: unclear etiology. Liver/biliary system looked fine on u/s. Awaiting scheduling of echocardiogram. This condition is improved with inc dosing of lasix since last visit. Wt loss 4 lbs. In 10d. Given her hx of albuminuria I'll recheck microalb/cr today.  Her serum protein/albumin levels have been consistently normal. No changes made today.  Continue low Na intake. Await labs from today to see if lasix dose can be continued as-is.   2) DM: better control on lantus as she has titrated this up. Next a1c due in 2 wks or so.  If still >8 then will talk to her about adding mealtime insulin. Cont metformin 1000mg  bid and victiza 1.2mg  qd.   3) Diabetic gastroparesis: pretty stable, though not ideal control overall. Cont reglan 5mg  qid prn. She has not seen Dr. Havery Moros, her GI MD, since 02/2019 so it would be good for her to f/u with him in the near future."  INTERIM HX: Microalbuminuria elevated (but stable) last visit so I increased her extended releast verapamil to 240mg  qd. Also, renal fxn and lytes stable so I continued her on lasix 40mg  qAM and 20mg  qPM.  Feeling well.   Lantus 65-70 U qd typically.  Glucoses normal at home.  Echocardiogram is scheduled for about mid July. Notes no side effect from inc dose of verapamil. Lasix 40 qam and 2qhs.  HTN: home bp's 130/80 region consistently.  [facility] has ordered a CT abd/pelvis w/contrast for further eval of her general abd/pelvic distention.    ROS as above, plus--> no fevers, no CP, no SOB, no wheezing, no cough, no dizziness, no HAs, no rashes, no melena/hematochezia.  No polyuria or polydipsia.  No myalgias or arthralgias.  No focal weakness, paresthesias, or  tremors.  No acute vision or hearing abnormalities.  No dysuria or unusual/new urinary urgency or frequency.  No recent changes in chronic LL pitting edema and her feeling of bloating/abd distention. No n/v/d or abd pain.  No palpitations.    Past Medical History:  Diagnosis Date   Abdominal bloating    likely from diab gastroparesis.  Dr. Havery Moros started trial of reglan 03/2019.   Benign brain tumor ([place])    Cystic lesion in cerebral aqueduct region with mild hydrocephalus-- stable MRI 02/2016.  Surveillance MRI 05/2017 --dilated cerebral aqueduct related to aqueductal stenosis and subsequent mild hydrocephalus (due to the 11 mm stable cystic lesion in cerebral aqueduct---?congenitial?.   Cataract    OU   Dysmenorrhea    vicodin occ during first 2 days of cycle.   Gluten intolerance    pt reports she underwent full GI w/u to r/o celiac dz   Hepatic steatosis    ultrasound 08/2017. Hx of very mild elevation of ALT.  Stable on u/s 06/2020   History of adenomatous polyp of colon 04/08/2019   recall Feb 2024   Hyperlipidemia, mixed    Hypertension    +white coat component   Hypertensive retinopathy    OU   Hypertensive retinopathy of both eyes    Insomnia    Iron deficiency 01/2019   Hb 11.3. Hemoccults neg x 3 03/19/19. EGD and colonoscopy 04/08/19 showed NO cause for IDA.  Pt  does have menorrhagia, though, so she'll see her GYN.  started FeSO4 325 qd approx 04/14/19.   Menorrhagia    resulting in [place] 2021   Proliferative diabetic retinopathy of both eyes ([place])    steroid injections 10/2017--improved   Sensorineural hearing loss of left ear    Sudden left hearing loss summer 2016--no improvement with steroids 01/2015 so brain MRI done by Dr. Redmond Baseman and it showed brain tumor that was determined to be benign.  Pt's hearing not bad enough for hearing aid as of 06/2016.   Type 2 diabetes with complication (HCC)    +microalbuminuria, diab retpthy, diabetic gastroparesis (gastric emptying study  mildly abnl 03/2017).  Recommended lantus 08/2018 but pt declined. Mild microalbuminuria.   Uterine fibroid     Past Surgical History:  Procedure Laterality Date   ANOSCOPY  05/12/2019   Procedure: normal exam, minimal hemorrhoid disease. Hyertrophied anal papila, benign appearing, posterior midline. Surgeon: Leighton Ruff MD   CHOLECYSTECTOMY  2000   COLONOSCOPY  04/08/2019   5 adenomas, recall 3 yrs; no cause for IDA found.  Hypertrophied anal papillae->bx showed low grade dysplasia; GI referred her to colorectal surgeon.   ESOPHAGOGASTRODUODENOSCOPY  04/08/2019   mild chronic reactive gastritis. H pylori NEG.  No cause for IDA found.   GAS INSERTION Right 10/31/2018   Procedure: Insertion Of C3F8 Gas;  Surgeon: Bernarda Caffey, MD;  Location: [place];  Service: Ophthalmology;  Laterality: Right;   GASTRIC EMPTYING SCAN  04/20/2017   Mildly abnormal, particularly the 1st hour of emptying.   MEMBRANE PEEL Right 10/31/2018   Procedure: MEMBRANE PEEL;  Surgeon: Bernarda Caffey, MD;  Location: [place];  Service: Ophthalmology;  Laterality: Right;   PARS PLANA VITRECTOMY Right 04/12/2018   Procedure: Right PARS PLANA VITRECTOMY WITH 25 GAUGE with intravitreal antibiotics;  Surgeon: Bernarda Caffey, MD;  Location: [place];  Service: Ophthalmology;  Laterality: Right;   PARS PLANA VITRECTOMY Right 10/31/2018   Procedure: PARS PLANA VITRECTOMY WITH 25 GAUGE;  Surgeon: Bernarda Caffey, MD;  Location: [place];  Service: Ophthalmology;  Laterality: Right;   PHOTOCOAGULATION WITH LASER Right 10/31/2018   Procedure: Photocoagulation With Laser;  Surgeon: Bernarda Caffey, MD;  Location: [place];  Service: Ophthalmology;  Laterality: Right;    Outpatient Medications Prior to Visit  Medication Sig Dispense Refill   amLODipine (NORVASC) 5 MG tablet Take 1 tablet by mouth once daily 90 tablet 0   furosemide (LASIX) 20 MG tablet 2 tabs po qAM and 1 tab po qPM 90 tablet 1   insulin glargine (LANTUS SOLOSTAR) 100 UNIT/ML Solostar Pen  65-70 units SQ qhs, patient will be gradually titrating this dose to get to target fasting glucose range of 100-110. 15 mL 3   liraglutide (VICTOZA) 18 MG/3ML SOPN INJECT 1.2MG  INTO THE SKIN DAILY 18 mL 0   losartan (COZAAR) 100 MG tablet Take 1 tablet by mouth once daily 90 tablet 0   metFORMIN (GLUCOPHAGE) 1000 MG tablet Take 1 tablet (1,000 mg total) by mouth 2 (two) times daily with a meal. 180 tablet 3   metoCLOPramide (REGLAN) 5 MG tablet TAKE 1 TABLET BY MOUTH 4 TIMES DAILY BEFORE MEAL(S) AND AT BEDTIME 120 tablet 2   metoprolol succinate (TOPROL-XL) 100 MG 24 hr tablet 2 tabs po qd 60 tablet 6   Multiple Vitamin (MULTIVITAMIN WITH MINERALS) TABS tablet Take 1 tablet by mouth daily.     OVER THE COUNTER MEDICATION Take 2 capsules by mouth daily. Neu Remedy  pantoprazole (PROTONIX) 40 MG tablet TAKE 1 TABLET BY MOUTH ONCE DAILY . 30 tablet 0   rosuvastatin (CRESTOR) 10 MG tablet Take 1 tablet by mouth once daily 30 tablet 0   verapamil (CALAN-SR) 240 MG CR tablet Take 1 tablet (240 mg total) by mouth at bedtime. 30 tablet 6   HYDROcodone-acetaminophen (NORCO/VICODIN) 5-325 MG tablet Take 1-2 tablets by mouth every 6 (six) hours as needed for moderate pain. (Patient not taking: No sig reported) 40 tablet 0   promethazine (PHENERGAN) 12.5 MG tablet 1-2 tabs po q6h prn nausea (Patient not taking: No sig reported) 30 tablet 1   No facility-administered medications prior to visit.    Allergies  Allergen Reactions   Gluten Meal Swelling   Lisinopril Cough   Pioglitazone Other (See Comments)    ELEVATED glucoses + worse chronic nausea    ROS As per HPI  PE: Vitals with BMI 08/09/2020 07/12/2020 07/02/2020  Height 5\' 3"  5\' 3"  5\' 3"   Weight 190 lbs 13 oz 184 lbs 13 oz 188 lbs 13 oz  BMI 33.81 38.25 05.39  Systolic 767 341 937  Diastolic 65 71 75  Pulse 86 88 84    Gen: Alert, well appearing.  Patient is oriented to person, place, time, and situation. AFFECT: pleasant, lucid thought  and speech. CV: RRR, no m/r/g.   LUNGS: CTA bilat, nonlabored resps, good aeration in all lung fields. EXT: no clubbing or cyanosis.  2-3 + bilat LL pitting edema, L leg edema starts a bit higher up LL than R side.   LABS:  Lab Results  Component Value Date   TSH 4.69 (H) 07/02/2020   Lab Results  Component Value Date   WBC 11.3 (H) 07/12/2020   HGB 11.7 07/12/2020   HCT 35.7 07/12/2020   MCV 83.8 07/12/2020   PLT 473 (H) 07/12/2020   Lab Results  Component Value Date   CREATININE 0.70 07/12/2020   BUN 27 (H) 07/12/2020   NA 139 07/12/2020   K 4.8 07/12/2020   CL 103 07/12/2020   CO2 23 07/12/2020   Lab Results  Component Value Date   ALT 61 (H) 07/02/2020   AST 34 07/02/2020   ALKPHOS 72 07/02/2020   BILITOT 0.2 07/02/2020   Lab Results  Component Value Date   CHOL 141 04/22/2020   Lab Results  Component Value Date   HDL 43.20 04/22/2020   Lab Results  Component Value Date   LDLCALC 58 04/22/2020   Lab Results  Component Value Date   TRIG 195.0 (H) 04/22/2020   Lab Results  Component Value Date   CHOLHDL 3 04/22/2020   Lab Results  Component Value Date   HGBA1C 9.6 (H) 04/22/2020   POC HbA1c today= 6.8%  IMPRESSION AND PLAN:  1) DM 2 with multiple complications (albuminuria, retinopathy, gastroparesis). Her sCr and electrolytes have consistently been stable, most recent 1 mo ago.  POC HbA1c today 6.8%.   Since I have to draw blood after all (renal fxn needed for her CT abd/pelv w/contrast tomorrow) I'll do venous Hba1c today as well. She has albuminuria --she is tolerating recent increase in verapamil cr to 240 qd. Cont lantus, victoza, and metformin. Plan recheck urine microalb/cr at next f/u in 3 mo.  2) Fluid retention: w/u unrevealing. Echo to be done in about a month. ABD u/s showed fatty liver but o/w normal. GYN MD ordered CT abd/pelv with contrast to be done tomorrow. Will check renal function today in prep for  this since it has been  almost a month since her last check. Cont lasix 40mg  qAM and 20mg  qPM  3) HTN: stable.  Cont amlodipine 5 qd, losartan 100 qd, toprol xl 200 qd, and verapamil SR 240 qd.  4) HLD: tolerating rosuvastatin 10mg  qd. LDL goal <70.  Her LDL was 58 about 4 mo ago. Plan rpt lipids 3 mo.   An After Visit Summary was printed and given to the patient.  FOLLOW UP: No follow-ups on file.  Signed:  Crissie Sickles, MD           08/09/2020

## 2020-08-10 ENCOUNTER — Inpatient Hospital Stay: Admission: RE | Admit: 2020-08-10 | Payer: 59 | Source: Ambulatory Visit

## 2020-08-11 ENCOUNTER — Other Ambulatory Visit: Payer: Self-pay | Admitting: Family Medicine

## 2020-08-13 ENCOUNTER — Other Ambulatory Visit: Payer: Self-pay | Admitting: Family Medicine

## 2020-08-16 ENCOUNTER — Other Ambulatory Visit: Payer: Self-pay | Admitting: Family Medicine

## 2020-08-16 DIAGNOSIS — E118 Type 2 diabetes mellitus with unspecified complications: Secondary | ICD-10-CM

## 2020-08-17 ENCOUNTER — Ambulatory Visit (HOSPITAL_COMMUNITY): Payer: 59

## 2020-08-20 ENCOUNTER — Encounter (INDEPENDENT_AMBULATORY_CARE_PROVIDER_SITE_OTHER): Payer: 59 | Admitting: Ophthalmology

## 2020-08-23 ENCOUNTER — Encounter: Payer: Self-pay | Admitting: Family Medicine

## 2020-08-23 ENCOUNTER — Ambulatory Visit (HOSPITAL_COMMUNITY)
Admission: RE | Admit: 2020-08-23 | Discharge: 2020-08-23 | Disposition: A | Discharge status: Home / Self Care | Payer: 59 | Source: Ambulatory Visit | Attending: Family Medicine | Admitting: Family Medicine

## 2020-08-23 ENCOUNTER — Other Ambulatory Visit: Payer: Self-pay | Admitting: Family Medicine

## 2020-08-23 ENCOUNTER — Other Ambulatory Visit: Payer: Self-pay

## 2020-08-23 DIAGNOSIS — R0601 Orthopnea: Secondary | ICD-10-CM | POA: Diagnosis not present

## 2020-08-23 DIAGNOSIS — R600 Localized edema: Secondary | ICD-10-CM | POA: Diagnosis not present

## 2020-08-23 DIAGNOSIS — I10 Essential (primary) hypertension: Secondary | ICD-10-CM | POA: Diagnosis not present

## 2020-08-23 DIAGNOSIS — R0609 Other forms of dyspnea: Secondary | ICD-10-CM | POA: Diagnosis not present

## 2020-08-23 DIAGNOSIS — E785 Hyperlipidemia, unspecified: Secondary | ICD-10-CM | POA: Diagnosis not present

## 2020-08-23 DIAGNOSIS — R0600 Dyspnea, unspecified: Secondary | ICD-10-CM | POA: Diagnosis not present

## 2020-08-23 DIAGNOSIS — R635 Abnormal weight gain: Secondary | ICD-10-CM | POA: Diagnosis not present

## 2020-08-23 HISTORY — PX: TRANSTHORACIC ECHOCARDIOGRAM: SHX275

## 2020-08-23 LAB — ECHOCARDIOGRAM COMPLETE
Area-P 1/2: 4.41 cm2
S' Lateral: 2.9 cm

## 2020-08-24 ENCOUNTER — Other Ambulatory Visit: Payer: Self-pay | Admitting: Family Medicine

## 2020-08-24 NOTE — Progress Notes (Signed)
[facility] Note  08/25/2020     CHIEF COMPLAINT Patient presents for Retina Follow Up   HISTORY OF PRESENT ILLNESS: Gloria Gomez is a 50 y.o. female who presents to the clinic today for:   HPI     Retina Follow Up   Patient presents with  Diabetic Retinopathy.  In both eyes.  This started years ago.  Severity is moderate.  Duration of 4.5 weeks.  Since onset it is stable.  I, the attending physician,  performed the HPI with the patient and updated documentation appropriately.        Comments   50 y/o female pt here for 4.5 wk f/u for PDR w/DME OU.  No change in [place] OU.  Denies pain, FOL, floaters.  No gtts.  BS 113 this a.m.  A1C 6.8.      Last edited by Bernarda Caffey, MD on 08/25/2020  4:50 PM.    Pt feels [facility] OU is about the same.   Referring physician:   HISTORICAL INFORMATION:   Selected notes from the MEDICAL RECORD NUMBER Referred for DM exam   CURRENT MEDICATIONS: No current outpatient medications on file. (Ophthalmic Drugs)   No current facility-administered medications for this visit. (Ophthalmic Drugs)   Current Outpatient Medications (Other)  Medication Sig   amLODipine (NORVASC) 5 MG tablet Take 1 tablet by mouth once daily   furosemide (LASIX) 20 MG tablet TAKE 2 TABLETS BY MOUTH IN THE MORNING AND 1 IN THE EVENING   HYDROcodone-acetaminophen (NORCO/VICODIN) 5-325 MG tablet Take 1-2 tablets by mouth every 6 (six) hours as needed for moderate pain. (Patient not taking: No sig reported)   insulin glargine (LANTUS SOLOSTAR) 100 UNIT/ML Solostar Pen INJECT 65-70 UNITS INTO THE SKIN AT BEDTIME. GRADUALLY TITRATING THIS DOSE TO GET TO [employer] FASTING GLUCOSE RANGE OF 100-110   losartan (COZAAR) 100 MG tablet Take 1 tablet by mouth once daily   metFORMIN (GLUCOPHAGE) 1000 MG tablet Take 1 tablet (1,000 mg total) by mouth 2 (two) times daily with a meal.   metoCLOPramide (REGLAN) 5 MG tablet TAKE 1 TABLET BY MOUTH 4 TIMES DAILY  BEFORE MEAL(S) AND AT BEDTIME   metoprolol succinate (TOPROL-XL) 100 MG 24 hr tablet Take 2 tablets by mouth once daily   Multiple Vitamin (MULTIVITAMIN WITH MINERALS) TABS tablet Take 1 tablet by mouth daily.   OVER THE COUNTER MEDICATION Take 2 capsules by mouth daily. Neu Remedy   pantoprazole (PROTONIX) 40 MG tablet TAKE 1 TABLET BY MOUTH ONCE DAILY . APPOINTMENT REQUIRED FOR FUTURE REFILLS   promethazine (PHENERGAN) 12.5 MG tablet 1-2 tabs po q6h prn nausea (Patient not taking: No sig reported)   rosuvastatin (CRESTOR) 10 MG tablet Take 1 tablet by mouth once daily   verapamil (CALAN-SR) 240 MG CR tablet Take 1 tablet (240 mg total) by mouth at bedtime.   VICTOZA 18 MG/3ML SOPN INJECT 1.2MG INTO THE SKIN DAILY   No current facility-administered medications for this visit. (Other)      REVIEW OF SYSTEMS: ROS   Positive for: Endocrine, Eyes Negative for: Constitutional, Gastrointestinal, Neurological, Skin, Genitourinary, Musculoskeletal, HENT, Cardiovascular, Respiratory, Psychiatric, Allergic/Imm, Heme/Lymph Last edited by Matthew Folks, COA on 08/25/2020  2:01 PM.        ALLERGIES Allergies  Allergen Reactions   Gluten Meal Swelling   Lisinopril Cough   Pioglitazone Other (See Comments)    ELEVATED glucoses + worse chronic nausea    PAST MEDICAL HISTORY Past Medical History:  Diagnosis Date   Abdominal bloating    likely from diab gastroparesis.  Dr. Havery Moros started trial of reglan 03/2019.   Benign brain tumor ([place])    Cystic lesion in cerebral aqueduct region with mild hydrocephalus-- stable MRI 02/2016.  Surveillance MRI 05/2017 --dilated cerebral aqueduct related to aqueductal stenosis and subsequent mild hydrocephalus (due to the 11 mm stable cystic lesion in cerebral aqueduct---?congenitial?.   Cataract    OU   Dysmenorrhea    vicodin occ during first 2 days of cycle.   Gluten intolerance    pt reports she underwent full GI w/u to r/o celiac dz   Hepatic  steatosis    ultrasound 08/2017. Hx of very mild elevation of ALT.  Stable on u/s 06/2020   History of adenomatous polyp of colon 04/08/2019   recall Feb 2024   Hyperlipidemia, mixed    Hypertension    +white coat component   Hypertensive retinopathy    OU   Hypertensive retinopathy of both eyes    Insomnia    Iron deficiency 01/2019   Hb 11.3. Hemoccults neg x 3 03/19/19. EGD and colonoscopy 04/08/19 showed NO cause for IDA.  Pt does have menorrhagia, though, so she'll see her GYN.  started FeSO4 325 qd approx 04/14/19.   Menorrhagia    resulting in IDA 2021   Proliferative diabetic retinopathy of both eyes ([place])    steroid injections 10/2017--improved   Sensorineural hearing loss of left ear    Sudden left hearing loss summer 2016--no improvement with steroids 01/2015 so brain MRI done by Dr. Redmond Baseman and it showed brain tumor that was determined to be benign.  Pt's hearing not bad enough for hearing aid as of 06/2016.   Type 2 diabetes with complication (HCC)    +microalbuminuria, diab retpthy, diabetic gastroparesis (gastric emptying study mildly abnl 03/2017).  Recommended lantus 08/2018 but pt declined. Mild microalbuminuria.   Uterine fibroid    Past Surgical History:  Procedure Laterality Date   ANOSCOPY  05/12/2019   Procedure: normal exam, minimal hemorrhoid disease. Hyertrophied anal papila, benign appearing, posterior midline. Surgeon: Leighton Ruff MD   CHOLECYSTECTOMY  2000   COLONOSCOPY  04/08/2019   5 adenomas, recall 3 yrs; no cause for IDA found.  Hypertrophied anal papillae->bx showed low grade dysplasia; GI referred her to colorectal surgeon.   ESOPHAGOGASTRODUODENOSCOPY  04/08/2019   mild chronic reactive gastritis. H pylori NEG.  No cause for IDA found.   GAS INSERTION Right 10/31/2018   Procedure: Insertion Of C3F8 Gas;  Surgeon: Bernarda Caffey, MD;  Location: [place];  Service: Ophthalmology;  Laterality: Right;   GASTRIC EMPTYING SCAN  04/20/2017   Mildly abnormal,  particularly the 1st hour of emptying.   MEMBRANE PEEL Right 10/31/2018   Procedure: MEMBRANE PEEL;  Surgeon: Bernarda Caffey, MD;  Location: [place];  Service: Ophthalmology;  Laterality: Right;   PARS PLANA VITRECTOMY Right 04/12/2018   Procedure: Right PARS PLANA VITRECTOMY WITH 25 GAUGE with intravitreal antibiotics;  Surgeon: Bernarda Caffey, MD;  Location: [place];  Service: Ophthalmology;  Laterality: Right;   PARS PLANA VITRECTOMY Right 10/31/2018   Procedure: PARS PLANA VITRECTOMY WITH 25 GAUGE;  Surgeon: Bernarda Caffey, MD;  Location: [place];  Service: Ophthalmology;  Laterality: Right;   PHOTOCOAGULATION WITH LASER Right 10/31/2018   Procedure: Photocoagulation With Laser;  Surgeon: Bernarda Caffey, MD;  Location: [place];  Service: Ophthalmology;  Laterality: Right;   TRANSTHORACIC ECHOCARDIOGRAM  08/23/2020   Grd I DD, o/w normal.  FAMILY HISTORY Family History  Problem Relation Age of Onset   Brain cancer Mother    Diabetes Father    Diabetes Maternal Grandmother    Cataracts Maternal Grandmother    Cervical cancer Paternal Grandmother    Colon cancer Maternal Grandfather 96   Amblyopia Neg Hx    Blindness Neg Hx    Glaucoma Neg Hx    Macular degeneration Neg Hx    Retinal detachment Neg Hx    Strabismus Neg Hx    Retinitis pigmentosa Neg Hx    Esophageal cancer Neg Hx    Stomach cancer Neg Hx    Rectal cancer Neg Hx     SOCIAL HISTORY Social History   Tobacco Use   Smoking status: Never   Smokeless tobacco: Never  Vaping Use   Vaping Use: Never used  Substance Use Topics   Alcohol use: No   Drug use: No         OPHTHALMIC EXAM:  Base Eye Exam     Visual Acuity (Snellen - Linear)       Right Left   Dist [place] 20/30    Dist cc  20/25   Dist ph [place] 20/25 -2          Tonometry (Tonopen, 2:20 PM)       Right Left   Pressure 15 16         Pupils       Dark Light Shape React APD   Right 2 1 Round Minimal None   Left 2 1 Round Minimal None          Visual Fields (Counting fingers)       Left Right    Full Full         Extraocular Movement       Right Left    Full, Ortho Full, Ortho         Neuro/Psych     Oriented x3: Yes   Mood/Affect: Normal         Dilation     Both eyes: 1.0% Mydriacyl, 2.5% Phenylephrine @ 2:20 PM           Slit Lamp and Fundus Exam     Slit Lamp Exam       Right Left   Lids/Lashes Dermatochalasis - upper lid, mild Meibomian gland dysfunction, Telangiectasia Dermatochalasis - upper lid, Meibomian gland dysfunction, Telangiectasia   Conjunctiva/Sclera White and quiet, sutures intact White and quiet   Cornea Clear, trace Punctate epithelial erosions, well healed temporal cataract wounds Trace Punctate epithelial erosions   Anterior Chamber Deep and quiet Deep and quiet   Iris Round and dilated, No NVI Round and dilated, No NVI   Lens PC IOL in good position, 1-2+ Posterior capsular opacification, PC folds 2-3+ Nuclear sclerosis, 2-3+ Cortical cataract   Vitreous post vitrectomy, trace pigment Vitreous syneresis, blood stained vitreous condensations improving centrally and settling inferiorly, residual blood clots inferiorly - improving, persistent blood stained floater just IN to disc - improving         Fundus Exam       Right Left   Disc mild Pallor, Sharp rim, temporal PPA Pink and sharp, Compact, PPA   C/D Ratio 0.2 0.0   Macula Flat, good foveal reflex, mild cystic changes ST to fovea -- persistent, persistent punctate exudate, scattered MA ST mac -- improved Flat, good foveal reflex, trace cystic changes -- slightly increased, scattered MA; trace ERM, light focal laser changes   Vessels  attenuated, Tortuous attenuated, mild Tortuousity   Periphery Attached, scattered DBH greatest superiorly, 360 PRP, good laser fill in 360 attached, scattered IRH; 360 PRP scars -- with good early fill in changes, inferior periphery obscured by persistent VH            IMAGING AND  PROCEDURES  Imaging and Procedures for _0 @  OCT, Retina - OU - Both Eyes       Right Eye Quality was good. Central Foveal Thickness: 337. Progression has improved. Findings include abnormal foveal contour, epiretinal membrane, no SRF, intraretinal hyper-reflective material, intraretinal fluid (Persistent IRF/IRHM temporal macula--slightly improved).   Left Eye Quality was good. Central Foveal Thickness: 312. Progression has worsened. Findings include normal foveal contour, intraretinal fluid, no SRF, intraretinal hyper-reflective material (Interval increase in IRF centrally and temp mac).   Notes *Images captured and stored on drive  Diagnosis / Impression:  DME OU OD: Persistent IRF/IRHM temporal macula--slightly improved OS: Interval increase in IRF centrally and temp mac  Clinical management:  See below  Abbreviations: NFP - Normal foveal profile. CME - cystoid macular edema. PED - pigment epithelial detachment. IRF - intraretinal fluid. SRF - subretinal fluid. EZ - ellipsoid zone. ERM - epiretinal membrane. ORA - outer retinal atrophy. ORT - outer retinal tubulation. SRHM - subretinal hyper-reflective material       Intravitreal Injection, Pharmacologic Agent - OD - Right Eye       Time Out 08/25/2020. 2:42 PM. Confirmed correct patient, procedure, site, and patient consented.   Anesthesia Topical anesthesia was used. Anesthetic medications included Lidocaine 2%, Proparacaine 0.5%.   Procedure Preparation included 5% betadine to ocular surface, eyelid speculum. A (32g) needle was used.   Injection: 2 mg aflibercept 2 MG/0.05ML   Route: Intravitreal, Site: Right Eye   NDC: A3590391, Lot: [id number], Expiration date: 06/26/2021, Waste: 0.05 mL   Post-op Post injection exam found visual acuity of at least counting fingers. The patient tolerated the procedure well. There were no complications. The patient received written and verbal post procedure care education.  Post injection medications were not given.      Intravitreal Injection, Pharmacologic Agent - OS - Left Eye       Time Out 08/25/2020. 3:12 PM. Confirmed correct patient, procedure, site, and patient consented.   Anesthesia Topical anesthesia was used. Anesthetic medications included Lidocaine 2%, Proparacaine 0.5%.   Procedure Preparation included 5% betadine to ocular surface, eyelid speculum. A (32g) needle was used.   Injection: 2 mg aflibercept 2 MG/0.05ML   Route: Intravitreal, Site: Left Eye   NDC: A3590391, Lot: [id number], Expiration date: 04/26/2021, Waste: 0.05 mL   Post-op Post injection exam found visual acuity of at least counting fingers. The patient tolerated the procedure well. There were no complications. The patient received written and verbal post procedure care education. Post injection medications were not given.              ASSESSMENT/PLAN:    ICD-10-CM   1. Proliferative diabetic retinopathy of both eyes with macular edema associated with type 2 diabetes mellitus (HCC)  W26.3785 Intravitreal Injection, Pharmacologic Agent - OD - Right Eye    Intravitreal Injection, Pharmacologic Agent - OS - Left Eye    aflibercept (EYLEA) SOLN 2 mg    aflibercept (EYLEA) SOLN 2 mg    2. Retinal edema  H35.81 OCT, Retina - OU - Both Eyes    3. Vitreous hemorrhage of left eye (HCC)  H43.12     4. Right endophthalmia  H44.001  5. Vitreous hemorrhage, right eye (HCC)  H43.11     6. Essential hypertension  I10     7. Hypertensive retinopathy of both eyes  H35.033     8. Combined forms of age-related cataract of left eye  H25.812     9. Pseudophakia of right eye  Z96.1       1,2.  Proliferative diabetic retinopathy w/ DME, OU  - HbA1c 9.1% (02.24.22) 11.0% (11.22.21), 8.3% (03.31.21), 12.2.20), 8.6% (06.30.20), 8.4% (02.14.20)  - s/p IVA OD #1 9.20.19, #2 (10.25.19), #3 (11.15.19), #4 (12.17.19), #5 (01.14.20), #6 (2.11.20), #7 (05.29.20), #8  (08.19.20), #9 (10.30.20), #10 (12.09.20), #11 (01.11.21), #12 (02.15.21), #13 (03.23.21), #14 (04.20.21), #15 (06.08.21), #16 (07.06.21)  - s/p IVA OS #1 9.27.19, #2 (10.25.19), #3 (11.15.19), #4 (12.17.19), #5 (01.14.20), #6 (2.11.20), #7 (04.26.20), #8 (05.29.20), #9 (06.26.20), #10 (08.05.20), #11 (11.11.20), #12 (12.09.20), #13 (01.11.21), #14 (02.15.21), #15 (03.23.21), #16 (04.20.21), #17 (06.08.21) -- IVA resistance, #18 (09.27.21)  - s/p IVE OD #1 (08.03.21) -- sample, #2 (09.10.21), #3 (10.08.21), #4 (11.23.21), #5 (12.21.21), #6 (01.19.22) sample, #7 (2.16.22), #8 (3.22.22), #9 (04.19.22), #10 (05.27.22)  - s/p IVE OS #1 (10.25.21), #2 (11.23.21), #3 (12.21.21), #4 (3.22.22)  - S/P PRP OS (09.20.19), (5.19.20), (08.19.20), (04.28.21), (02.02.22)  - S/P PRP OD (9.27.19 and 11.21.19), fill-in (04.14.20) (09.03.20, surgery)  - S/P focal laser OS (07.06.21)  - FA (9.20.19) shows +NVE OU and leaking MA and capillary nonperfusion  - repeat FA 11.15.19 shows NV regressing OU  - pre-op: OD w/ VA stable at 20/25, but there is some preretinal fibrosis / tractional membranes just superior to disc and mild central DME  - s/p 25g PPV+MP+10% C3F8 gas OD (09.03.20) -- ERM/PRF removal OD  - BCVA improved to 20/25 OD, down to 20/25 from 20/20 OS             - fibrosis/ERM stably improved; retina attached  - OCT shows OD: Persistent IRF/IRHM temporal macula--slightly improved at 4.5 wks; OS worse: Interval increase in IRF centrally and temp mac at 14 wks post injection  - recommend IVE OD #11, OS #5 today, 06.29.22 w/ f/u in 5 wks  - pt in agreement  - RBA of procedure discussed, questions answered  - informed consent obtained   - see procedure note  - Avastin informed consent form signed and scanned on 01.11.2021 (OU)  - Eylea informed consent form signed and scanned on 08.03.2021  - [email or web address] benefits investigation started, 08.03.21 -- approved as of 09.10.21 -- 2022 insurance verified as of  02.16.22  - f/u 5 weeks, DFE, OCT, possible injection(s)  3. Vitreous hemorrhage OS -- recurrent, improving  - recurrent VH, onset 09.22.21  - etiology: secondary to PDR as described above (no RT/RD on exam)  - s/p PRP OS (9.20.19), (05.19.20), (08.19.20), (04.28.21), (02.02.22)  - s/p IVA OS on 4.26.20, 5.29.20, 6.26.20, 8.5.20,11.11.20, 12.06.20 and so on as above  - VH clear centrally and settled inferiorly   - BCVA down to 20/25 from 20/20 from DME  - f/u 5 weeks DFE, OCT, possible injection  4. History of Endophthalmitis OD  - s/p IVA OU 04/09/2018  - s/p 25g PPV w/ intravitreal vanc, ceftaz and cefepime OD, 2.14.2020  - s/p intravitreal tap / vanc and ceflaz injections (02.16.20)             - gram stain (2.14.20) shows G+ cocci, WBCs mostly PMNs;   - repeat gram stain from t/i (2.16.20) -- no organisms, just WBCs             -  cultures from vitreous grew rare Staph warneri; cultures from t/i -- no growth             - doing well, BCVA 20/25             - inflammation/posterior debris resolved             - IOP 14 off Brimonidine  - completed po pred taper -- caused significant elevations in BG  - monitor  5. History of Vitreous Hemorrhage OD -- cleared from PPV x2 for endophthalmitis and ERM/preretinal fibrosis  - secondary to PDR as described below  - S/P IVA OD #1 (09.20.19), #2 (10.25.19), #3 (11.15.19), #4 (12.16.19), #5 (03/12/2018)  - S/P PRP OD #1 (09.27.19), fill in OD (11.21.19) -- each somewhat limited inferiorly by residual VH  6,7. Hypertensive retinopathy OU  - BP elevated today (9.27.21) -- 164/91, left arm  - discussed importance of tight BP control.  - monitor  8. Combined form age related cataract OS-   - The symptoms of cataract, surgical options, and treatments and risks were discussed with patient.  - discussed diagnosis and progression  9. Pseudophakia OD  - s/p CE/IOL OD (Dr. Zenia Resides, 12.11.20)  - beautiful surgery, doing well  Ophthalmic Meds  Ordered this visit:  Meds ordered this encounter  Medications   aflibercept (EYLEA) SOLN 2 mg   aflibercept (EYLEA) SOLN 2 mg       Return in about 5 weeks (around 09/29/2020) for 5 wk f/u for PDR w/DME OU w/DFE/OCT/likely inj..  There are no Patient Instructions on file for this visit.  This document serves as a record of services personally performed by Gardiner Sleeper, MD, PhD. It was created on their behalf by Roselee Nova, COMT. The creation of this record is the provider's dictation and/or activities during the visit.  Electronically signed by: Roselee Nova, COMT 08/25/20 4:57 PM  This document serves as a record of services personally performed by Gardiner Sleeper, MD, PhD. It was created on their behalf by Estill Bakes, COT an ophthalmic technician. The creation of this record is the provider's dictation and/or activities during the visit.    Electronically signed by: Estill Bakes, COT 6.29.22 @ 4:57 PM   Gardiner Sleeper, M.D., Ph.D. Diseases & Surgery of the Retina and [place] 08/25/20  I have reviewed the above documentation for accuracy and completeness, and I agree with the above. Gardiner Sleeper, M.D., Ph.D. 08/25/20 4:57 PM   Abbreviations: M myopia (nearsighted); A astigmatism; H hyperopia (farsighted); P presbyopia; Mrx spectacle prescription;  CTL contact lenses; OD right eye; OS left eye; OU both eyes  XT exotropia; ET esotropia; PEK punctate epithelial keratitis; PEE punctate epithelial erosions; DES dry eye syndrome; MGD meibomian gland dysfunction; ATs artificial tears; PFAT's preservative free artificial tears; [place] nuclear sclerotic cataract; PSC posterior subcapsular cataract; ERM epi-retinal membrane; PVD posterior vitreous detachment; RD retinal detachment; DM diabetes mellitus; DR diabetic retinopathy; NPDR non-proliferative diabetic retinopathy; PDR proliferative diabetic retinopathy; CSME clinically significant macular edema;  DME diabetic macular edema; dbh dot blot hemorrhages; CWS cotton wool spot; POAG primary open angle glaucoma; C/D cup-to-disc ratio; HVF humphrey visual field; GVF goldmann visual field; OCT optical coherence tomography; IOP intraocular pressure; BRVO Branch retinal vein occlusion; CRVO central retinal vein occlusion; CRAO central retinal artery occlusion; BRAO branch retinal artery occlusion; RT retinal tear; SB scleral buckle; PPV pars plana vitrectomy; VH Vitreous hemorrhage; PRP panretinal laser photocoagulation; IVK intravitreal  kenalog; VMT vitreomacular traction; MH Macular hole;  NVD neovascularization of the disc; NVE neovascularization elsewhere; AREDS age related eye disease study; ARMD age related macular degeneration; POAG primary open angle glaucoma; EBMD epithelial/anterior basement membrane dystrophy; ACIOL anterior chamber intraocular lens; IOL intraocular lens; PCIOL posterior chamber intraocular lens; Phaco/IOL phacoemulsification with intraocular lens placement; [place] photorefractive keratectomy; LASIK laser assisted in situ keratomileusis; HTN hypertension; DM diabetes mellitus; COPD chronic obstructive pulmonary disease

## 2020-08-25 ENCOUNTER — Encounter (INDEPENDENT_AMBULATORY_CARE_PROVIDER_SITE_OTHER): Payer: Self-pay | Admitting: Ophthalmology

## 2020-08-25 ENCOUNTER — Ambulatory Visit (INDEPENDENT_AMBULATORY_CARE_PROVIDER_SITE_OTHER): Payer: 59 | Admitting: Ophthalmology

## 2020-08-25 ENCOUNTER — Other Ambulatory Visit: Payer: Self-pay

## 2020-08-25 DIAGNOSIS — H35033 Hypertensive retinopathy, bilateral: Secondary | ICD-10-CM

## 2020-08-25 DIAGNOSIS — I10 Essential (primary) hypertension: Secondary | ICD-10-CM

## 2020-08-25 DIAGNOSIS — H25812 Combined forms of age-related cataract, left eye: Secondary | ICD-10-CM

## 2020-08-25 DIAGNOSIS — H44001 Unspecified purulent endophthalmitis, right eye: Secondary | ICD-10-CM

## 2020-08-25 DIAGNOSIS — H3581 Retinal edema: Secondary | ICD-10-CM | POA: Diagnosis not present

## 2020-08-25 DIAGNOSIS — E113513 Type 2 diabetes mellitus with proliferative diabetic retinopathy with macular edema, bilateral: Secondary | ICD-10-CM | POA: Diagnosis not present

## 2020-08-25 DIAGNOSIS — H4311 Vitreous hemorrhage, right eye: Secondary | ICD-10-CM | POA: Diagnosis not present

## 2020-08-25 DIAGNOSIS — H4312 Vitreous hemorrhage, left eye: Secondary | ICD-10-CM

## 2020-08-25 DIAGNOSIS — Z961 Presence of intraocular lens: Secondary | ICD-10-CM

## 2020-08-25 MED ORDER — AFLIBERCEPT 2MG/0.05ML IZ SOLN FOR KALEIDOSCOPE
2.0000 mg | INTRAVITREAL | Status: AC | PRN
  Administered 2020-08-25: 2 mg via INTRAVITREAL

## 2020-09-02 ENCOUNTER — Other Ambulatory Visit: Payer: Self-pay | Admitting: Family Medicine

## 2020-09-07 ENCOUNTER — Encounter: Payer: Self-pay | Admitting: Family Medicine

## 2020-09-14 DIAGNOSIS — E113513 Type 2 diabetes mellitus with proliferative diabetic retinopathy with macular edema, bilateral: Secondary | ICD-10-CM

## 2020-09-15 ENCOUNTER — Other Ambulatory Visit: Payer: Self-pay | Admitting: Family Medicine

## 2020-09-15 ENCOUNTER — Telehealth: Payer: Self-pay | Admitting: Family Medicine

## 2020-09-15 DIAGNOSIS — E118 Type 2 diabetes mellitus with unspecified complications: Secondary | ICD-10-CM

## 2020-09-15 NOTE — Telephone Encounter (Signed)
Patient requesting referral to [facility], Dr. Alanson Aly for management of her diabetes. Please place referral.

## 2020-09-15 NOTE — Telephone Encounter (Signed)
Please advise if okay?

## 2020-09-15 NOTE — Telephone Encounter (Signed)
OK, referral ordered 

## 2020-09-24 NOTE — Progress Notes (Signed)
[facility] Note  09/29/2020     CHIEF COMPLAINT Patient presents for Retina Follow Up   HISTORY OF PRESENT ILLNESS: Gloria Gomez is a 50 y.o. female who presents to the clinic today for:   HPI     Retina Follow Up   Patient presents with  Diabetic Retinopathy.  In both eyes.  This started years ago.  Severity is moderate.  Duration of 5 weeks.  Since onset it is stable.  I, the attending physician,  performed the HPI with the patient and updated documentation appropriately.        Comments   50 y/o female pt here for 5 wk f/u for PDR w/DME OU.  No change in [place] OU.  Denies pain, FOL, floaters.  No gtts.  BS 119 this morning.  HgA1C 6.8 08/2020.      Last edited by Bernarda Caffey, MD on 09/29/2020  4:07 PM.     Pt feels like vision is doing pretty well, she is trying to get a referral to an endocrinologist due to swelling she can't get under control   Referring physician:   HISTORICAL INFORMATION:   Selected notes from the [facility] Referred for DM exam   CURRENT MEDICATIONS: No current outpatient medications on file. (Ophthalmic Drugs)   No current facility-administered medications for this visit. (Ophthalmic Drugs)   Current Outpatient Medications (Other)  Medication Sig   amLODipine (NORVASC) 5 MG tablet Take 1 tablet by mouth once daily   furosemide (LASIX) 20 MG tablet TAKE 2 TABLETS BY MOUTH IN THE MORNING AND 1 IN THE EVENING   HYDROcodone-acetaminophen (NORCO/VICODIN) 5-325 MG tablet Take 1-2 tablets by mouth every 6 (six) hours as needed for moderate pain. (Patient not taking: No sig reported)   LANTUS SOLOSTAR 100 UNIT/ML Solostar Pen INJECT 65-70 UNITS INTO THE SKIN AT BEDTIME. GRADUALLY TITRATING THIS DOSE TO GET TO [employer] FASTING GLUCOSE RANGE OF 100-110   losartan (COZAAR) 100 MG tablet Take 1 tablet by mouth once daily   metFORMIN (GLUCOPHAGE) 1000 MG tablet Take 1 tablet (1,000 mg total) by mouth 2 (two) times  daily with a meal.   metoCLOPramide (REGLAN) 5 MG tablet TAKE 1 TABLET BY MOUTH 4 TIMES DAILY BEFORE MEAL(S) AND AT BEDTIME   metoprolol succinate (TOPROL-XL) 100 MG 24 hr tablet Take 2 tablets by mouth once daily   Multiple Vitamin (MULTIVITAMIN WITH MINERALS) TABS tablet Take 1 tablet by mouth daily.   OVER THE COUNTER MEDICATION Take 2 capsules by mouth daily. Neu Remedy   pantoprazole (PROTONIX) 40 MG tablet TAKE 1 TABLET BY MOUTH ONCE DAILY . APPOINTMENT REQUIRED FOR FUTURE REFILLS   promethazine (PHENERGAN) 12.5 MG tablet 1-2 tabs po q6h prn nausea (Patient not taking: No sig reported)   rosuvastatin (CRESTOR) 10 MG tablet Take 1 tablet by mouth once daily   verapamil (CALAN-SR) 240 MG CR tablet Take 1 tablet (240 mg total) by mouth at bedtime.   VICTOZA 18 MG/3ML SOPN INJECT 1.2MG INTO THE SKIN DAILY   No current facility-administered medications for this visit. (Other)      REVIEW OF SYSTEMS: ROS   Positive for: Endocrine, Eyes Negative for: Constitutional, Gastrointestinal, Neurological, Skin, Genitourinary, Musculoskeletal, HENT, Cardiovascular, Respiratory, Psychiatric, Allergic/Imm, Heme/Lymph Last edited by Matthew Folks, COA on 09/29/2020  1:29 PM.         ALLERGIES Allergies  Allergen Reactions   Gluten Meal Swelling   Lisinopril Cough   Pioglitazone Other (See  Comments)    ELEVATED glucoses + worse chronic nausea    PAST MEDICAL HISTORY Past Medical History:  Diagnosis Date   Abdominal bloating    likely from diab gastroparesis.  Dr. Havery Moros started trial of reglan 03/2019.   Benign brain tumor ([place])    Cystic lesion in cerebral aqueduct region with mild hydrocephalus-- stable MRI 02/2016.  Surveillance MRI 05/2017 --dilated cerebral aqueduct related to aqueductal stenosis and subsequent mild hydrocephalus (due to the 11 mm stable cystic lesion in cerebral aqueduct---?congenitial?.   Cataract    OU   Dysmenorrhea    vicodin occ during first 2 days of  cycle.   Gluten intolerance    pt reports she underwent full GI w/u to r/o celiac dz   Hepatic steatosis    ultrasound 08/2017. Hx of very mild elevation of ALT.  Stable on u/s 06/2020   History of adenomatous polyp of colon 04/08/2019   recall Feb 2024   Hyperlipidemia, mixed    Hypertension    +white coat component   Hypertensive retinopathy    OU   Hypertensive retinopathy of both eyes    Insomnia    Iron deficiency 01/2019   Hb 11.3. Hemoccults neg x 3 03/19/19. EGD and colonoscopy 04/08/19 showed NO cause for IDA.  Pt does have menorrhagia, though, so she'll see her GYN.  started FeSO4 325 qd approx 04/14/19.   Menorrhagia    resulting in IDA 2021   Proliferative diabetic retinopathy of both eyes ([place])    steroid injections 10/2017--improved   Sensorineural hearing loss of left ear    Sudden left hearing loss summer 2016--no improvement with steroids 01/2015 so brain MRI done by Dr. Redmond Baseman and it showed brain tumor that was determined to be benign.  Pt's hearing not bad enough for hearing aid as of 06/2016.   Type 2 diabetes with complication (HCC)    +microalbuminuria, diab retpthy, diabetic gastroparesis (gastric emptying study mildly abnl 03/2017).  Recommended lantus 08/2018 but pt declined. Mild microalbuminuria.   Uterine fibroid    Past Surgical History:  Procedure Laterality Date   ANOSCOPY  05/12/2019   Procedure: normal exam, minimal hemorrhoid disease. Hyertrophied anal papila, benign appearing, posterior midline. Surgeon: Leighton Ruff MD   CHOLECYSTECTOMY  2000   COLONOSCOPY  04/08/2019   5 adenomas, recall 3 yrs; no cause for IDA found.  Hypertrophied anal papillae->bx showed low grade dysplasia; GI referred her to colorectal surgeon.   ESOPHAGOGASTRODUODENOSCOPY  04/08/2019   mild chronic reactive gastritis. H pylori NEG.  No cause for IDA found.   GAS INSERTION Right 10/31/2018   Procedure: Insertion Of C3F8 Gas;  Surgeon: Bernarda Caffey, MD;  Location: [place];  Service:  Ophthalmology;  Laterality: Right;   GASTRIC EMPTYING SCAN  04/20/2017   Mildly abnormal, particularly the 1st hour of emptying.   MEMBRANE PEEL Right 10/31/2018   Procedure: MEMBRANE PEEL;  Surgeon: Bernarda Caffey, MD;  Location: [place];  Service: Ophthalmology;  Laterality: Right;   PARS PLANA VITRECTOMY Right 04/12/2018   Procedure: Right PARS PLANA VITRECTOMY WITH 25 GAUGE with intravitreal antibiotics;  Surgeon: Bernarda Caffey, MD;  Location: [place];  Service: Ophthalmology;  Laterality: Right;   PARS PLANA VITRECTOMY Right 10/31/2018   Procedure: PARS PLANA VITRECTOMY WITH 25 GAUGE;  Surgeon: Bernarda Caffey, MD;  Location: [place];  Service: Ophthalmology;  Laterality: Right;   PHOTOCOAGULATION WITH LASER Right 10/31/2018   Procedure: Photocoagulation With Laser;  Surgeon: Bernarda Caffey, MD;  Location: [place];  Service: Ophthalmology;  Laterality: Right;   TRANSTHORACIC ECHOCARDIOGRAM  08/23/2020   Grd I DD, o/w normal.    FAMILY HISTORY Family History  Problem Relation Age of Onset   Brain cancer Mother    Diabetes Father    Diabetes Maternal Grandmother    Cataracts Maternal Grandmother    Cervical cancer Paternal Grandmother    Colon cancer Maternal Grandfather 31   Amblyopia Neg Hx    Blindness Neg Hx    Glaucoma Neg Hx    Macular degeneration Neg Hx    Retinal detachment Neg Hx    Strabismus Neg Hx    Retinitis pigmentosa Neg Hx    Esophageal cancer Neg Hx    Stomach cancer Neg Hx    Rectal cancer Neg Hx     SOCIAL HISTORY Social History   Tobacco Use   Smoking status: Never   Smokeless tobacco: Never  Vaping Use   Vaping Use: Never used  Substance Use Topics   Alcohol use: No   Drug use: No         OPHTHALMIC EXAM:  Base Eye Exam     Visual Acuity (Snellen - Linear)       Right Left   Dist [place] 20/25 -2    Dist cc  20/30 -2   Dist ph cc  20/25 -2    Correction: Glasses         Tonometry (Tonopen, 1:31 PM)       Right Left   Pressure 11 15          Pupils       Dark Light Shape React APD   Right 2 1 Round Minimal None   Left 2 1 Round Minimal None         Visual Fields (Counting fingers)       Left Right    Full Full         Extraocular Movement       Right Left    Full, Ortho Full, Ortho         Neuro/Psych     Oriented x3: Yes   Mood/Affect: Normal         Dilation     Both eyes: 1.0% Mydriacyl, 2.5% Phenylephrine @ 1:31 PM           Slit Lamp and Fundus Exam     Slit Lamp Exam       Right Left   Lids/Lashes Dermatochalasis - upper lid, mild Meibomian gland dysfunction, Telangiectasia Dermatochalasis - upper lid, Meibomian gland dysfunction, Telangiectasia   Conjunctiva/Sclera White and quiet, sutures intact White and quiet   Cornea Clear, trace Punctate epithelial erosions, well healed temporal cataract wounds Trace Punctate epithelial erosions   Anterior Chamber Deep and quiet Deep and quiet   Iris Round and dilated, No NVI Round and dilated, No NVI   Lens PC IOL in good position, 1-2+ Posterior capsular opacification, PC folds 2-3+ Nuclear sclerosis, 2-3+ Cortical cataract   Vitreous post vitrectomy, trace pigment Vitreous syneresis, blood stained vitreous condensations improving centrally and settling inferiorly, residual blood clots inferiorly - improving, persistent blood stained floater just IN to disc - improving         Fundus Exam       Right Left   Disc mild Pallor, Sharp rim, temporal PPA Pink and sharp, Compact, PPA   C/D Ratio 0.2 0.0   Macula Flat, good foveal reflex, mild cystic changes ST to fovea -- persistent, persistent punctate  exudate, scattered MA ST mac -- improved Flat, good foveal reflex, trace cystic changes -- vastly improved, scattered MA; trace ERM, light focal laser changes   Vessels attenuated, Tortuous attenuated, mild Tortuousity   Periphery Attached, scattered DBH greatest superiorly, 360 PRP, good laser fill in 360 attached, scattered IRH; 360 PRP  scars -- with good fill in changes, inferior periphery obscured by persistent VH            IMAGING AND PROCEDURES  Imaging and Procedures for _0 @  OCT, Retina - OU - Both Eyes       Right Eye Quality was good. Central Foveal Thickness: 327. Progression has been stable. Findings include abnormal foveal contour, epiretinal membrane, no SRF, intraretinal hyper-reflective material, intraretinal fluid (Persistent IRF temporal macula).   Left Eye Quality was good. Central Foveal Thickness: 284. Progression has improved. Findings include normal foveal contour, no SRF, intraretinal hyper-reflective material, intraretinal fluid (Interval improvement in IRF centrally, just trace cystic changes temporal macula visible on widefield).   Notes *Images captured and stored on drive  Diagnosis / Impression:  DME OU OD: Persistent IRF temporal macula OS: Interval improvement in IRF centrally, just trace cystic changes temporal macula visible on widefield  Clinical management:  See below  Abbreviations: NFP - Normal foveal profile. CME - cystoid macular edema. PED - pigment epithelial detachment. IRF - intraretinal fluid. SRF - subretinal fluid. EZ - ellipsoid zone. ERM - epiretinal membrane. ORA - outer retinal atrophy. ORT - outer retinal tubulation. SRHM - subretinal hyper-reflective material       Intravitreal Injection, Pharmacologic Agent - OD - Right Eye       Time Out 09/29/2020. 1:32 PM. Confirmed correct patient, procedure, site, and patient consented.   Anesthesia Topical anesthesia was used. Anesthetic medications included Lidocaine 2%, Proparacaine 0.5%.   Procedure Preparation included 5% betadine to ocular surface, eyelid speculum. A (32g) needle was used.   Injection: 2 mg aflibercept 2 MG/0.05ML   Route: Intravitreal, Site: Right Eye   NDC: A3590391, Lot: [id number], Expiration date: 07/27/2021, Waste: 0.05 mL   Post-op Post injection exam found visual  acuity of at least counting fingers. The patient tolerated the procedure well. There were no complications. The patient received written and verbal post procedure care education. Post injection medications were not given.      Intravitreal Injection, Pharmacologic Agent - OS - Left Eye       Time Out 09/29/2020. 1:32 PM. Confirmed correct patient, procedure, site, and patient consented.   Anesthesia Topical anesthesia was used. Anesthetic medications included Lidocaine 2%, Proparacaine 0.5%.   Procedure Preparation included 5% betadine to ocular surface, eyelid speculum. A (32g) needle was used.   Injection: 2 mg aflibercept 2 MG/0.05ML   Route: Intravitreal, Site: Left Eye   NDC: A3590391, Lot: [id number], Expiration date: 06/26/2021, Waste: 0.05 mL   Post-op Post injection exam found visual acuity of at least counting fingers. The patient tolerated the procedure well. There were no complications. The patient received written and verbal post procedure care education. Post injection medications were not given.            ASSESSMENT/PLAN:    ICD-10-CM   1. Proliferative diabetic retinopathy of both eyes with macular edema associated with type 2 diabetes mellitus (HCC)  H82.9937 Intravitreal Injection, Pharmacologic Agent - OD - Right Eye    Intravitreal Injection, Pharmacologic Agent - OS - Left Eye    aflibercept (EYLEA) SOLN 2 mg    aflibercept (EYLEA) SOLN 2 mg  2. Retinal edema  H35.81 OCT, Retina - OU - Both Eyes    3. Vitreous hemorrhage of left eye (HCC)  H43.12     4. Right endophthalmia  H44.001     5. Vitreous hemorrhage, right eye ([place])  H43.11     6. Essential hypertension  I10     7. Hypertensive retinopathy of both eyes  H35.033     8. Combined forms of age-related cataract of left eye  H25.812     9. Pseudophakia of right eye  Z96.1        1,2.  Proliferative diabetic retinopathy w/ DME, OU  - HbA1c 9.1% (02.24.22) 11.0% (11.22.21), 8.3%  (03.31.21), 12.2.20), 8.6% (06.30.20), 8.4% (02.14.20)  - s/p IVA OD #1 9.20.19, #2 (10.25.19), #3 (11.15.19), #4 (12.17.19), #5 (01.14.20), #6 (2.11.20), #7 (05.29.20), #8 (08.19.20), #9 (10.30.20), #10 (12.09.20), #11 (01.11.21), #12 (02.15.21), #13 (03.23.21), #14 (04.20.21), #15 (06.08.21), #16 (07.06.21)  - s/p IVA OS #1 9.27.19, #2 (10.25.19), #3 (11.15.19), #4 (12.17.19), #5 (01.14.20), #6 (2.11.20), #7 (04.26.20), #8 (05.29.20), #9 (06.26.20), #10 (08.05.20), #11 (11.11.20), #12 (12.09.20), #13 (01.11.21), #14 (02.15.21), #15 (03.23.21), #16 (04.20.21), #17 (06.08.21) -- IVA resistance, #18 (09.27.21)  - s/p IVE OD #1 (08.03.21) -- sample, #2 (09.10.21), #3 (10.08.21), #4 (11.23.21), #5 (12.21.21), #6 (01.19.22) sample, #7 (2.16.22), #8 (3.22.22), #9 (04.19.22), #10 (05.27.22), #11 (06.29.22)  - s/p IVE OS #1 (10.25.21), #2 (11.23.21), #3 (12.21.21), #4 (3.22.22), #5 (06.29.22)  - S/P PRP OS (09.20.19), (5.19.20), (08.19.20), (04.28.21), (02.02.22)  - S/P PRP OD (9.27.19 and 11.21.19), fill-in (04.14.20) (09.03.20, surgery)  - S/P focal laser OS (07.06.21)  - FA (9.20.19) shows +NVE OU and leaking MA and capillary nonperfusion  - repeat FA 11.15.19 shows NV regressing OU  - pre-op: OD w/ VA stable at 20/25, but there is some preretinal fibrosis / tractional membranes just superior to disc and mild central DME  - s/p 25g PPV+MP+10% C3F8 gas OD (09.03.20) -- ERM/PRF removal OD  - BCVA stable at 20/25 OU             - fibrosis/ERM stably improved; retina attached  - OCT shows OD: Persistent IRF temporal macula; OS: Interval improvement in IRF centrally, just trace cystic changes temporal macula visible on widefield at 5 weeks  - discussed possible benefit of focal laser OD  - recommend IVE OD #12, OS #6 today, 08.03.22 w/ f/u in 5 wks  - pt in agreement  - RBA of procedure discussed, questions answered  - informed consent obtained   - see procedure note  - Avastin informed consent form  signed and scanned on 01.11.2021 (OU)  - Eylea informed consent form signed and scanned on 08.03.2021  - [email or web address] benefits investigation started, 08.03.21 -- approved as of 09.10.21 -- 2022 insurance verified as of 02.16.22  - f/u 5 weeks, DFE, OCT, possible injection(s), possible focal laser OD  3. Vitreous hemorrhage OS -- recurrent, improved  - recurrent VH, onset 09.22.21  - etiology: secondary to PDR as described above (no RT/RD on exam)  - s/p PRP OS (9.20.19), (05.19.20), (08.19.20), (04.28.21), (02.02.22)  - s/p IVA OS on 4.26.20, 5.29.20, 6.26.20, 8.5.20,11.11.20, 12.06.20 and so on as above  - VH clear centrally and settled inferiorly -- now white  - BCVA stable at 20/25  - f/u 5 weeks DFE, OCT, possible injection  4. History of Endophthalmitis OD  - s/p IVA OU 04/09/2018  - s/p 25g PPV w/ intravitreal vanc, ceftaz and cefepime OD, 2.14.2020  -  s/p intravitreal tap / vanc and ceflaz injections (02.16.20)             - gram stain (2.14.20) shows G+ cocci, WBCs mostly PMNs;   - repeat gram stain from t/i (2.16.20) -- no organisms, just WBCs             - cultures from vitreous grew rare Staph warneri; cultures from t/i -- no growth             - doing well, BCVA 20/25             - inflammation/posterior debris resolved             - IOP 11 off Brimonidine  - completed po pred taper -- caused significant elevations in BG  - monitor  5. History of Vitreous Hemorrhage OD -- cleared from PPV x2 for endophthalmitis and ERM/preretinal fibrosis  - secondary to PDR as described below  - S/P IVA OD #1 (09.20.19), #2 (10.25.19), #3 (11.15.19), #4 (12.16.19), #5 (03/12/2018)  - S/P PRP OD #1 (09.27.19), fill in OD (11.21.19) -- each somewhat limited inferiorly by residual VH  6,7. Hypertensive retinopathy OU  - BP elevated today (9.27.21) -- 164/91, left arm  - discussed importance of tight BP control.  - monitor  8. Combined form age related cataract OS-   - The symptoms of  cataract, surgical options, and treatments and risks were discussed with patient.  - discussed diagnosis and progression  9. Pseudophakia OD  - s/p CE/IOL OD (Dr. Zenia Resides, 12.11.20)  - beautiful surgery, doing well  Ophthalmic Meds Ordered this visit:  Meds ordered this encounter  Medications   aflibercept (EYLEA) SOLN 2 mg   aflibercept (EYLEA) SOLN 2 mg        Return in about 5 weeks (around 11/03/2020) for f/u PDR OU, DFE, OCT.  There are no Patient Instructions on file for this visit.  This document serves as a record of services personally performed by Gardiner Sleeper, MD, PhD. It was created on their behalf by Roselee Nova, COMT. The creation of this record is the provider's dictation and/or activities during the visit.  Electronically signed by: Roselee Nova, COMT 09/29/20 4:10 PM  This document serves as a record of services personally performed by Gardiner Sleeper, MD, PhD. It was created on their behalf by San Jetty. Owens Shark, OA an ophthalmic technician. The creation of this record is the provider's dictation and/or activities during the visit.    Electronically signed by: San Jetty. Owens Shark, [place] 08.03.2022 4:10 PM   Gardiner Sleeper, M.D., Ph.D. Diseases & Surgery of the Retina and Vitreous [facility]  I have reviewed the above documentation for accuracy and completeness, and I agree with the above. Gardiner Sleeper, M.D., Ph.D. 09/29/20 4:10 PM   Abbreviations: M myopia (nearsighted); A astigmatism; H hyperopia (farsighted); P presbyopia; Mrx spectacle prescription;  CTL contact lenses; OD right eye; OS left eye; OU both eyes  XT exotropia; ET esotropia; PEK punctate epithelial keratitis; PEE punctate epithelial erosions; DES dry eye syndrome; MGD meibomian gland dysfunction; ATs artificial tears; PFAT's preservative free artificial tears; [place] nuclear sclerotic cataract; PSC posterior subcapsular cataract; ERM epi-retinal membrane; PVD posterior vitreous  detachment; RD retinal detachment; DM diabetes mellitus; DR diabetic retinopathy; NPDR non-proliferative diabetic retinopathy; PDR proliferative diabetic retinopathy; CSME clinically significant macular edema; DME diabetic macular edema; dbh dot blot hemorrhages; CWS cotton wool spot; POAG primary open angle glaucoma; C/D cup-to-disc  ratio; HVF humphrey visual field; GVF goldmann visual field; OCT optical coherence tomography; IOP intraocular pressure; BRVO Branch retinal vein occlusion; CRVO central retinal vein occlusion; CRAO central retinal artery occlusion; BRAO branch retinal artery occlusion; RT retinal tear; SB scleral buckle; PPV pars plana vitrectomy; VH Vitreous hemorrhage; PRP panretinal laser photocoagulation; IVK intravitreal kenalog; VMT vitreomacular traction; MH Macular hole;  NVD neovascularization of the disc; NVE neovascularization elsewhere; AREDS age related eye disease study; ARMD age related macular degeneration; POAG primary open angle glaucoma; EBMD epithelial/anterior basement membrane dystrophy; ACIOL anterior chamber intraocular lens; IOL intraocular lens; PCIOL posterior chamber intraocular lens; Phaco/IOL phacoemulsification with intraocular lens placement; [place] photorefractive keratectomy; LASIK laser assisted in situ keratomileusis; HTN hypertension; DM diabetes mellitus; COPD chronic obstructive pulmonary disease

## 2020-09-29 ENCOUNTER — Other Ambulatory Visit: Payer: Self-pay

## 2020-09-29 ENCOUNTER — Encounter (INDEPENDENT_AMBULATORY_CARE_PROVIDER_SITE_OTHER): Payer: Self-pay | Admitting: Ophthalmology

## 2020-09-29 ENCOUNTER — Ambulatory Visit (INDEPENDENT_AMBULATORY_CARE_PROVIDER_SITE_OTHER): Payer: 59 | Admitting: Ophthalmology

## 2020-09-29 DIAGNOSIS — H3581 Retinal edema: Secondary | ICD-10-CM | POA: Diagnosis not present

## 2020-09-29 DIAGNOSIS — H4311 Vitreous hemorrhage, right eye: Secondary | ICD-10-CM

## 2020-09-29 DIAGNOSIS — H35033 Hypertensive retinopathy, bilateral: Secondary | ICD-10-CM

## 2020-09-29 DIAGNOSIS — H44001 Unspecified purulent endophthalmitis, right eye: Secondary | ICD-10-CM

## 2020-09-29 DIAGNOSIS — I10 Essential (primary) hypertension: Secondary | ICD-10-CM | POA: Diagnosis not present

## 2020-09-29 DIAGNOSIS — Z961 Presence of intraocular lens: Secondary | ICD-10-CM

## 2020-09-29 DIAGNOSIS — H4312 Vitreous hemorrhage, left eye: Secondary | ICD-10-CM

## 2020-09-29 DIAGNOSIS — E113513 Type 2 diabetes mellitus with proliferative diabetic retinopathy with macular edema, bilateral: Secondary | ICD-10-CM

## 2020-09-29 DIAGNOSIS — H25812 Combined forms of age-related cataract, left eye: Secondary | ICD-10-CM

## 2020-09-29 MED ORDER — AFLIBERCEPT 2MG/0.05ML IZ SOLN FOR KALEIDOSCOPE
2.0000 mg | INTRAVITREAL | Status: AC | PRN
  Administered 2020-09-29: 2 mg via INTRAVITREAL

## 2020-10-19 ENCOUNTER — Other Ambulatory Visit: Payer: Self-pay | Admitting: Family Medicine

## 2020-10-20 ENCOUNTER — Other Ambulatory Visit: Payer: Self-pay | Admitting: Family Medicine

## 2020-11-02 NOTE — Progress Notes (Signed)
[facility] Note  11/03/2020     CHIEF COMPLAINT Patient presents for Retina Follow Up   HISTORY OF PRESENT ILLNESS: Gloria Gomez is a 50 y.o. female who presents to the clinic today for:   HPI     Retina Follow Up   Patient presents with  Diabetic Retinopathy.  In both eyes.  This started years ago.  Severity is moderate.  Duration of 5 weeks.  Since onset it is stable.  I, the attending physician,  performed the HPI with the patient and updated documentation appropriately.        Comments   50 y/o female pt here for 5 wk f/u for PDR OU.  Feels VA OS may be slightly weaker.  No change in [place] OD.  Denies pain, FOL, floaters.  No gtts.  BS 117 this a.m.  A1C 6.8.      Last edited by Bernarda Caffey, MD on 11/03/2020  9:08 PM.    Pt doing well overall.  Seems to have less floaters.  Referring physician:   HISTORICAL INFORMATION:   Selected notes from the MEDICAL RECORD NUMBER Referred for DM exam   CURRENT MEDICATIONS: No current outpatient medications on file. (Ophthalmic Drugs)   No current facility-administered medications for this visit. (Ophthalmic Drugs)   Current Outpatient Medications (Other)  Medication Sig   amLODipine (NORVASC) 5 MG tablet Take 1 tablet by mouth once daily   furosemide (LASIX) 20 MG tablet TAKE 2 TABLETS BY MOUTH IN THE MORNING AND 1 IN THE EVENING   HYDROcodone-acetaminophen (NORCO/VICODIN) 5-325 MG tablet Take 1-2 tablets by mouth every 6 (six) hours as needed for moderate pain. (Patient not taking: No sig reported)   LANTUS SOLOSTAR 100 UNIT/ML Solostar Pen INJECT 65-70 UNITS INTO THE SKIN AT BEDTIME. GRADUALLY TITRATING TO THIS DOSE TO GET [employer] FASTING GLUCOSE RANGE OF 100-110   losartan (COZAAR) 100 MG tablet Take 1 tablet by mouth once daily   metFORMIN (GLUCOPHAGE) 1000 MG tablet Take 1 tablet (1,000 mg total) by mouth 2 (two) times daily with a meal.   metoCLOPramide (REGLAN) 5 MG tablet TAKE 1 TABLET BY  MOUTH 4 TIMES DAILY BEFORE MEAL(S) AND AT BEDTIME   metoprolol succinate (TOPROL-XL) 100 MG 24 hr tablet Take 2 tablets by mouth once daily   Multiple Vitamin (MULTIVITAMIN WITH MINERALS) TABS tablet Take 1 tablet by mouth daily.   OVER THE COUNTER MEDICATION Take 2 capsules by mouth daily. Neu Remedy   pantoprazole (PROTONIX) 40 MG tablet TAKE 1 TABLET BY MOUTH ONCE DAILY . APPOINTMENT REQUIRED FOR FUTURE REFILLS   promethazine (PHENERGAN) 12.5 MG tablet 1-2 tabs po q6h prn nausea (Patient not taking: No sig reported)   rosuvastatin (CRESTOR) 10 MG tablet Take 1 tablet by mouth once daily   verapamil (CALAN-SR) 240 MG CR tablet Take 1 tablet (240 mg total) by mouth at bedtime.   VICTOZA 18 MG/3ML SOPN INJECT 1.2MG INTO THE SKIN DAILY   No current facility-administered medications for this visit. (Other)    REVIEW OF SYSTEMS: ROS   Positive for: Endocrine, Eyes Negative for: Constitutional, Gastrointestinal, Neurological, Skin, Genitourinary, Musculoskeletal, HENT, Cardiovascular, Respiratory, Psychiatric, Allergic/Imm, Heme/Lymph Last edited by Matthew Folks, COA on 11/03/2020  2:15 PM.      ALLERGIES Allergies  Allergen Reactions   Gluten Meal Swelling   Lisinopril Cough   Pioglitazone Other (See Comments)    ELEVATED glucoses + worse chronic nausea    PAST MEDICAL HISTORY Past  Medical History:  Diagnosis Date   Abdominal bloating    likely from diab gastroparesis.  Dr. Havery Moros started trial of reglan 03/2019.   Benign brain tumor ([place])    Cystic lesion in cerebral aqueduct region with mild hydrocephalus-- stable MRI 02/2016.  Surveillance MRI 05/2017 --dilated cerebral aqueduct related to aqueductal stenosis and subsequent mild hydrocephalus (due to the 11 mm stable cystic lesion in cerebral aqueduct---?congenitial?.   Cataract    OU   Dysmenorrhea    vicodin occ during first 2 days of cycle.   Gluten intolerance    pt reports she underwent full GI w/u to r/o celiac dz    Hepatic steatosis    ultrasound 08/2017. Hx of very mild elevation of ALT.  Stable on u/s 06/2020   History of adenomatous polyp of colon 04/08/2019   recall Feb 2024   Hyperlipidemia, mixed    Hypertension    +white coat component   Hypertensive retinopathy    OU   Hypertensive retinopathy of both eyes    Insomnia    Iron deficiency 01/2019   Hb 11.3. Hemoccults neg x 3 03/19/19. EGD and colonoscopy 04/08/19 showed NO cause for IDA.  Pt does have menorrhagia, though, so she'll see her GYN.  started FeSO4 325 qd approx 04/14/19.   Menorrhagia    resulting in IDA 2021   Proliferative diabetic retinopathy of both eyes ([place])    steroid injections 10/2017--improved   Sensorineural hearing loss of left ear    Sudden left hearing loss summer 2016--no improvement with steroids 01/2015 so brain MRI done by Dr. Redmond Baseman and it showed brain tumor that was determined to be benign.  Pt's hearing not bad enough for hearing aid as of 06/2016.   Type 2 diabetes with complication (HCC)    +microalbuminuria, diab retpthy, diabetic gastroparesis (gastric emptying study mildly abnl 03/2017).  Recommended lantus 08/2018 but pt declined. Mild microalbuminuria.   Uterine fibroid    Past Surgical History:  Procedure Laterality Date   ANOSCOPY  05/12/2019   Procedure: normal exam, minimal hemorrhoid disease. Hyertrophied anal papila, benign appearing, posterior midline. Surgeon: Leighton Ruff MD   CHOLECYSTECTOMY  2000   COLONOSCOPY  04/08/2019   5 adenomas, recall 3 yrs; no cause for IDA found.  Hypertrophied anal papillae->bx showed low grade dysplasia; GI referred her to colorectal surgeon.   ESOPHAGOGASTRODUODENOSCOPY  04/08/2019   mild chronic reactive gastritis. H pylori NEG.  No cause for IDA found.   GAS INSERTION Right 10/31/2018   Procedure: Insertion Of C3F8 Gas;  Surgeon: Bernarda Caffey, MD;  Location: [facility];  Service: Ophthalmology;  Laterality: Right;   GASTRIC EMPTYING SCAN  04/20/2017   Mildly  abnormal, particularly the 1st hour of emptying.   MEMBRANE PEEL Right 10/31/2018   Procedure: MEMBRANE PEEL;  Surgeon: Bernarda Caffey, MD;  Location: [facility];  Service: Ophthalmology;  Laterality: Right;   PARS PLANA VITRECTOMY Right 04/12/2018   Procedure: Right PARS PLANA VITRECTOMY WITH 25 GAUGE with intravitreal antibiotics;  Surgeon: Bernarda Caffey, MD;  Location: [facility];  Service: Ophthalmology;  Laterality: Right;   PARS PLANA VITRECTOMY Right 10/31/2018   Procedure: PARS PLANA VITRECTOMY WITH 25 GAUGE;  Surgeon: Bernarda Caffey, MD;  Location: [facility];  Service: Ophthalmology;  Laterality: Right;   PHOTOCOAGULATION WITH LASER Right 10/31/2018   Procedure: Photocoagulation With Laser;  Surgeon: Bernarda Caffey, MD;  Location: [facility];  Service: Ophthalmology;  Laterality: Right;   TRANSTHORACIC ECHOCARDIOGRAM  08/23/2020   Grd I DD, o/w  normal.    FAMILY HISTORY Family History  Problem Relation Age of Onset   Brain cancer Mother    Diabetes Father    Diabetes Maternal Grandmother    Cataracts Maternal Grandmother    Cervical cancer Paternal Grandmother    Colon cancer Maternal Grandfather 84   Amblyopia Neg Hx    Blindness Neg Hx    Glaucoma Neg Hx    Macular degeneration Neg Hx    Retinal detachment Neg Hx    Strabismus Neg Hx    Retinitis pigmentosa Neg Hx    Esophageal cancer Neg Hx    Stomach cancer Neg Hx    Rectal cancer Neg Hx     SOCIAL HISTORY Social History   Tobacco Use   Smoking status: Never   Smokeless tobacco: Never  Vaping Use   Vaping Use: Never used  Substance Use Topics   Alcohol use: No   Drug use: No         OPHTHALMIC EXAM:  Base Eye Exam     Visual Acuity (Snellen - Linear)       Right Left   Dist [place] 20/25 -2    Dist cc  20/30 -   Dist ph [place] NI    Dist ph cc  20/25 -2    Correction: Glasses         Tonometry (Tonopen, 2:17 PM)       Right Left   Pressure 10 12         Pupils       Dark Light Shape React APD   Right 2 1  Round Minimal None   Left 2 1 Round Minimal None         Visual Fields (Counting fingers)       Left Right    Full Full         Extraocular Movement       Right Left    Full, Ortho Full, Ortho         Neuro/Psych     Oriented x3: Yes   Mood/Affect: Normal         Dilation     Both eyes: 1.0% Mydriacyl, 2.5% Phenylephrine @ 2:17 PM           Slit Lamp and Fundus Exam     Slit Lamp Exam       Right Left   Lids/Lashes Dermatochalasis - upper lid, mild Meibomian gland dysfunction, Telangiectasia Dermatochalasis - upper lid, Meibomian gland dysfunction, Telangiectasia   Conjunctiva/Sclera White and quiet, sutures intact White and quiet   Cornea Clear, well healed temporal cataract wounds Trace Punctate epithelial erosions   Anterior Chamber Deep and quiet Deep and quiet   Iris Round and dilated, No NVI Round and dilated, No NVI   Lens PC IOL in good position, 1-2+ Posterior capsular opacification, PC folds 2-3+ Nuclear sclerosis, 2-3+ Cortical cataract   Vitreous post vitrectomy, trace pigment Vitreous syneresis, blood stained vitreous condensations improving centrally and settling inferiorly, residual blood clots inferiorly - improving, persistent blood stained floater just IN to disc - improving--now white in color         Fundus Exam       Right Left   Disc mild Pallor, Sharp rim, temporal PPA Pink and sharp, Compact, PPA   C/D Ratio 0.2 0.0   Macula Flat, good foveal reflex, mild cystic changes ST to fovea -- persistent, persistent punctate exudate, scattered MA ST mac -- improved, focal MAs temporal macula--good  targets for focal laser. Flat, good foveal reflex, trace cystic changes -- vastly improved, scattered MA; trace ERM, light focal laser changes   Vessels attenuated, Tortuous attenuated, mild Tortuousity   Periphery Attached, scattered DBH greatest superiorly, 360 PRP, good laser fill in 360 attached, scattered IRH; 360 PRP scars -- with good fill  in changes            IMAGING AND PROCEDURES  Imaging and Procedures for _0 @  OCT, Retina - OU - Both Eyes       Right Eye Quality was good. Central Foveal Thickness: 335. Progression has improved. Findings include abnormal foveal contour, epiretinal membrane, no SRF, intraretinal hyper-reflective material, intraretinal fluid (Persistent IRF temporal macula--?mild improvement).   Left Eye Quality was good. Central Foveal Thickness: 276. Progression has been stable. Findings include normal foveal contour, no SRF, intraretinal hyper-reflective material, intraretinal fluid (Interval improvement in IRF centrally, just trace cystic changes temporal macula visible on widefield).   Notes *Images captured and stored on drive  Diagnosis / Impression:  DME OU OD: Persistent IRF temporal macula--?mild improvement OS: Interval improvement in IRF centrally, just trace cystic changes temporal macula visible on widefield  Clinical management:  See below  Abbreviations: NFP - Normal foveal profile. CME - cystoid macular edema. PED - pigment epithelial detachment. IRF - intraretinal fluid. SRF - subretinal fluid. EZ - ellipsoid zone. ERM - epiretinal membrane. ORA - outer retinal atrophy. ORT - outer retinal tubulation. SRHM - subretinal hyper-reflective material       Intravitreal Injection, Pharmacologic Agent - OD - Right Eye       Time Out 11/03/2020. 3:52 PM. Confirmed correct patient, procedure, site, and patient consented.   Anesthesia Topical anesthesia was used. Anesthetic medications included Lidocaine 2%, Proparacaine 0.5%.   Procedure Preparation included 5% betadine to ocular surface, eyelid speculum. A (32g) needle was used.   Injection: 2 mg aflibercept 2 MG/0.05ML   Route: Intravitreal, Site: Right Eye   NDC: A3590391, Lot: [id number], Expiration date: 07/26/2021, Waste: 0.05 mL   Post-op Post injection exam found visual acuity of at least counting fingers.  The patient tolerated the procedure well. There were no complications. The patient received written and verbal post procedure care education. Post injection medications were not given.      Intravitreal Injection, Pharmacologic Agent - OS - Left Eye       Time Out 11/03/2020. 3:53 PM. Confirmed correct patient, procedure, site, and patient consented.   Anesthesia Topical anesthesia was used. Anesthetic medications included Lidocaine 2%, Proparacaine 0.5%.   Procedure Preparation included 5% betadine to ocular surface, eyelid speculum. A (32g) needle was used.   Injection: 2 mg aflibercept 2 MG/0.05ML   Route: Intravitreal, Site: Left Eye   NDC: A3590391, Lot: [id number], Expiration date: 08/26/2021, Waste: 0.05 mL   Post-op Post injection exam found visual acuity of at least counting fingers. The patient tolerated the procedure well. There were no complications. The patient received written and verbal post procedure care education. Post injection medications were not given.            ASSESSMENT/PLAN:    ICD-10-CM   1. Proliferative diabetic retinopathy of both eyes with macular edema associated with type 2 diabetes mellitus (HCC)  I96.7893 Intravitreal Injection, Pharmacologic Agent - OD - Right Eye    Intravitreal Injection, Pharmacologic Agent - OS - Left Eye    aflibercept (EYLEA) SOLN 2 mg    aflibercept (EYLEA) SOLN 2 mg    2. Retinal edema  H35.81 OCT, Retina - OU - Both Eyes    3. Vitreous hemorrhage of left eye (HCC)  H43.12     4. Right endophthalmia  H44.001     5. Vitreous hemorrhage, right eye ([place])  H43.11     6. Essential hypertension  I10     7. Hypertensive retinopathy of both eyes  H35.033     8. Combined forms of age-related cataract of left eye  H25.812     9. Pseudophakia of right eye  Z96.1     1,2.  Proliferative diabetic retinopathy w/ DME, OU  - HbA1c 7.8% (06.13.22), 9.1% (02.24.22) 11.0% (11.22.21)  - s/p IVA OD #1 9.20.19, #2  (10.25.19), #3 (11.15.19), #4 (12.17.19), #5 (01.14.20), #6 (2.11.20), #7 (05.29.20), #8 (08.19.20), #9 (10.30.20), #10 (12.09.20), #11 (01.11.21), #12 (02.15.21), #13 (03.23.21), #14 (04.20.21), #15 (06.08.21), #16 (07.06.21) -- IVA resistance  - s/p IVA OS #1 9.27.19, #2 (10.25.19), #3 (11.15.19), #4 (12.17.19), #5 (01.14.20), #6 (2.11.20), #7 (04.26.20), #8 (05.29.20), #9 (06.26.20), #10 (08.05.20), #11 (11.11.20), #12 (12.09.20), #13 (01.11.21), #14 (02.15.21), #15 (03.23.21), #16 (04.20.21), #17 (06.08.21) -- IVA resistance, #18 (09.27.21)  - s/p IVE OD #1 (08.03.21) -- sample, #2 (09.10.21), #3 (10.08.21), #4 (11.23.21), #5 (12.21.21), #6 (01.19.22) sample, #7 (2.16.22), #8 (3.22.22), #9 (04.19.22), #10 (05.27.22), #11 (06.29.22), #12 (08.03.22)  - s/p IVE OS #1 (10.25.21), #2 (11.23.21), #3 (12.21.21), #4 (3.22.22), #5 (06.29.22), #6 (08.03.22)  - S/P PRP OS (09.20.19), (5.19.20), (08.19.20), (04.28.21), (02.02.22)  - S/P PRP OD (9.27.19 and 11.21.19), fill-in (04.14.20) (09.03.20, surgery)  - S/P focal laser OS (07.06.21)  - FA (9.20.19) shows +NVE OU and leaking MA and capillary nonperfusion  - repeat FA 11.15.19 shows NV regressing OU  - pre-op: OD w/ VA stable at 20/25, but there is some preretinal fibrosis / tractional membranes just superior to disc and mild central DME  - s/p 25g PPV+MP+10% C3F8 gas OD (09.03.20) -- ERM/PRF removal OD  - BCVA stable at 20/25 OU             - fibrosis/ERM stably improved; retina attached  - OCT shows OD: Persistent IRF temporal macula--?mild improvrment; OS: Interval improvement in IRF centrally, just trace cystic changes temporal macula visible on widefield at 5 weeks  - discussed possible benefit of focal laser OD  - recommend IVE OD #13, OS #7 today, 09.07.22 w/ f/u in 5 wks for repeat injection, but will f/u in 2 wks for focal laser  - pt in agreement  - RBA of procedure discussed, questions answered  - informed consent obtained   - see procedure  note  - Avastin informed consent form signed and scanned on 01.11.2021 (OU)  - Eylea informed consent form signed and scanned on 08.03.2021  - [email or web address] benefits investigation started, 08.03.21 -- approved as of 09.10.21 -- 2022 insurance verified as of 02.16.22  - f/u 2 weeks, DFE, OCT, focal laser OD  3. Vitreous hemorrhage OS -- recurrent, improved  - recurrent VH, onset 09.22.21  - etiology: secondary to PDR as described above (no RT/RD on exam)  - s/p PRP OS (9.20.19), (05.19.20), (08.19.20), (04.28.21), (02.02.22)  - s/p IVA OS on 4.26.20, 5.29.20, 6.26.20, 8.5.20,11.11.20, 12.06.20 and so on as above  - VH clear centrally and settled inferiorly -- now white  - BCVA stable at 20/25  - f/u 5 weeks DFE, OCT, possible injection  4. History of Endophthalmitis OD  - s/p IVA OU 04/09/2018  - s/p 25g PPV w/ intravitreal vanc, ceftaz and  cefepime OD, 2.14.2020  - s/p intravitreal tap / vanc and ceflaz injections (02.16.20)             - gram stain (2.14.20) shows G+ cocci, WBCs mostly PMNs;   - repeat gram stain from t/i (2.16.20) -- no organisms, just WBCs             - cultures from vitreous grew rare Staph warneri; cultures from t/i -- no growth             - doing well, BCVA 20/25             - inflammation/posterior debris resolved             - IOP 10 off Brimonidine  - completed po pred taper -- caused significant elevations in BG  - monitor  5. History of Vitreous Hemorrhage OD -- cleared from PPV x2 for endophthalmitis and ERM/preretinal fibrosis  - secondary to PDR as described below  - S/P IVA OD #1 (09.20.19), #2 (10.25.19), #3 (11.15.19), #4 (12.16.19), #5 (03/12/2018)  - S/P PRP OD #1 (09.27.19), fill in OD (11.21.19) -- each somewhat limited inferiorly by residual VH  6,7. Hypertensive retinopathy OU  - BP elevated today (9.27.21) -- 164/91, left arm  - discussed importance of tight BP control.  - monitor  8. Combined form age related cataract OS-   - The symptoms of  cataract, surgical options, and treatments and risks were discussed with patient.  - discussed diagnosis and progression  9. Pseudophakia OD  - s/p CE/IOL OD (Dr. Zenia Resides, 12.11.20)  - beautiful surgery, doing well  Ophthalmic Meds Ordered this visit:  Meds ordered this encounter  Medications   aflibercept (EYLEA) SOLN 2 mg   aflibercept (EYLEA) SOLN 2 mg      Return in about 2 weeks (around 11/17/2020) for PDR OU w/DFE/OCT/focal laser OD.  There are no Patient Instructions on file for this visit.  This document serves as a record of services personally performed by Gardiner Sleeper, MD, PhD. It was created on their behalf by Orvan Falconer, an ophthalmic technician. The creation of this record is the provider's dictation and/or activities during the visit.    Electronically signed by: Orvan Falconer, OA, 11/03/20  9:14 PM   Gardiner Sleeper, M.D., Ph.D. Diseases & Surgery of the Retina and [place] 11/03/2020   I have reviewed the above documentation for accuracy and completeness, and I agree with the above. Gardiner Sleeper, M.D., Ph.D. 11/03/20 9:14 PM  Abbreviations: M myopia (nearsighted); A astigmatism; H hyperopia (farsighted); P presbyopia; Mrx spectacle prescription;  CTL contact lenses; OD right eye; OS left eye; OU both eyes  XT exotropia; ET esotropia; PEK punctate epithelial keratitis; PEE punctate epithelial erosions; DES dry eye syndrome; MGD meibomian gland dysfunction; ATs artificial tears; PFAT's preservative free artificial tears; [place] nuclear sclerotic cataract; PSC posterior subcapsular cataract; ERM epi-retinal membrane; PVD posterior vitreous detachment; RD retinal detachment; DM diabetes mellitus; DR diabetic retinopathy; NPDR non-proliferative diabetic retinopathy; PDR proliferative diabetic retinopathy; CSME clinically significant macular edema; DME diabetic macular edema; dbh dot blot hemorrhages; CWS cotton wool spot; POAG  primary open angle glaucoma; C/D cup-to-disc ratio; HVF humphrey visual field; GVF goldmann visual field; OCT optical coherence tomography; IOP intraocular pressure; BRVO Branch retinal vein occlusion; CRVO central retinal vein occlusion; CRAO central retinal artery occlusion; BRAO branch retinal artery occlusion; RT retinal tear; SB scleral buckle; PPV pars plana vitrectomy; VH Vitreous hemorrhage;  PRP panretinal laser photocoagulation; IVK intravitreal kenalog; VMT vitreomacular traction; MH Macular hole;  NVD neovascularization of the disc; NVE neovascularization elsewhere; AREDS age related eye disease study; ARMD age related macular degeneration; POAG primary open angle glaucoma; EBMD epithelial/anterior basement membrane dystrophy; ACIOL anterior chamber intraocular lens; IOL intraocular lens; PCIOL posterior chamber intraocular lens; Phaco/IOL phacoemulsification with intraocular lens placement; [place] photorefractive keratectomy; LASIK laser assisted in situ keratomileusis; HTN hypertension; DM diabetes mellitus; COPD chronic obstructive pulmonary disease

## 2020-11-03 ENCOUNTER — Encounter (INDEPENDENT_AMBULATORY_CARE_PROVIDER_SITE_OTHER): Payer: Self-pay | Admitting: Ophthalmology

## 2020-11-03 ENCOUNTER — Other Ambulatory Visit: Payer: Self-pay

## 2020-11-03 ENCOUNTER — Ambulatory Visit (INDEPENDENT_AMBULATORY_CARE_PROVIDER_SITE_OTHER): Payer: 59 | Admitting: Ophthalmology

## 2020-11-03 DIAGNOSIS — E113513 Type 2 diabetes mellitus with proliferative diabetic retinopathy with macular edema, bilateral: Secondary | ICD-10-CM

## 2020-11-03 DIAGNOSIS — H4311 Vitreous hemorrhage, right eye: Secondary | ICD-10-CM

## 2020-11-03 DIAGNOSIS — H35033 Hypertensive retinopathy, bilateral: Secondary | ICD-10-CM

## 2020-11-03 DIAGNOSIS — I10 Essential (primary) hypertension: Secondary | ICD-10-CM

## 2020-11-03 DIAGNOSIS — H44001 Unspecified purulent endophthalmitis, right eye: Secondary | ICD-10-CM | POA: Diagnosis not present

## 2020-11-03 DIAGNOSIS — H3581 Retinal edema: Secondary | ICD-10-CM

## 2020-11-03 DIAGNOSIS — H25812 Combined forms of age-related cataract, left eye: Secondary | ICD-10-CM

## 2020-11-03 DIAGNOSIS — H4312 Vitreous hemorrhage, left eye: Secondary | ICD-10-CM | POA: Diagnosis not present

## 2020-11-03 DIAGNOSIS — Z961 Presence of intraocular lens: Secondary | ICD-10-CM

## 2020-11-03 MED ORDER — AFLIBERCEPT 2MG/0.05ML IZ SOLN FOR KALEIDOSCOPE
2.0000 mg | INTRAVITREAL | Status: AC | PRN
  Administered 2020-11-03: 2 mg via INTRAVITREAL

## 2020-11-08 ENCOUNTER — Other Ambulatory Visit: Payer: Self-pay | Admitting: Family Medicine

## 2020-11-08 DIAGNOSIS — E118 Type 2 diabetes mellitus with unspecified complications: Secondary | ICD-10-CM

## 2020-11-10 ENCOUNTER — Other Ambulatory Visit: Payer: Self-pay

## 2020-11-10 ENCOUNTER — Ambulatory Visit (INDEPENDENT_AMBULATORY_CARE_PROVIDER_SITE_OTHER): Payer: 59 | Admitting: Family Medicine

## 2020-11-10 ENCOUNTER — Encounter: Payer: Self-pay | Admitting: Family Medicine

## 2020-11-10 VITALS — BP 130/78 | HR 87 | Temp 97.6°F | Resp 16 | Ht 63.0 in | Wt 198.8 lb

## 2020-11-10 DIAGNOSIS — Z23 Encounter for immunization: Secondary | ICD-10-CM | POA: Diagnosis not present

## 2020-11-10 DIAGNOSIS — K760 Fatty (change of) liver, not elsewhere classified: Secondary | ICD-10-CM

## 2020-11-10 DIAGNOSIS — I10 Essential (primary) hypertension: Secondary | ICD-10-CM

## 2020-11-10 DIAGNOSIS — K3184 Gastroparesis: Secondary | ICD-10-CM

## 2020-11-10 DIAGNOSIS — E7800 Pure hypercholesterolemia, unspecified: Secondary | ICD-10-CM

## 2020-11-10 DIAGNOSIS — M353 Polymyalgia rheumatica: Secondary | ICD-10-CM | POA: Diagnosis not present

## 2020-11-10 DIAGNOSIS — E118 Type 2 diabetes mellitus with unspecified complications: Secondary | ICD-10-CM

## 2020-11-10 DIAGNOSIS — R7989 Other specified abnormal findings of blood chemistry: Secondary | ICD-10-CM

## 2020-11-10 LAB — COMPREHENSIVE METABOLIC PANEL
ALT: 82 U/L — ABNORMAL HIGH (ref 0–35)
AST: 72 U/L — ABNORMAL HIGH (ref 0–37)
Albumin: 3.9 g/dL (ref 3.5–5.2)
Alkaline Phosphatase: 83 U/L (ref 39–117)
BUN: 15 mg/dL (ref 6–23)
CO2: 24 mEq/L (ref 19–32)
Calcium: 9.6 mg/dL (ref 8.4–10.5)
Chloride: 102 mEq/L (ref 96–112)
Creatinine, Ser: 0.74 mg/dL (ref 0.40–1.20)
GFR: 93.32 mL/min (ref >60.00)
Glucose, Bld: 117 mg/dL — ABNORMAL HIGH (ref 70–99)
Potassium: 4 mEq/L (ref 3.5–5.1)
Sodium: 140 mEq/L (ref 135–145)
Total Bilirubin: 0.2 mg/dL (ref 0.2–1.2)
Total Protein: 6.8 g/dL (ref 6.0–8.3)

## 2020-11-10 LAB — SEDIMENTATION RATE: Sed Rate: 70 mm/h — ABNORMAL HIGH (ref 0–30)

## 2020-11-10 LAB — TSH: TSH: 4.24 u[IU]/mL (ref 0.35–5.50)

## 2020-11-10 LAB — HEMOGLOBIN A1C: Hgb A1c MFr Bld: 8.6 % — ABNORMAL HIGH (ref 4.6–6.5)

## 2020-11-10 LAB — T4, FREE: Free T4: 0.78 ng/dL (ref 0.60–1.60)

## 2020-11-10 MED ORDER — GABAPENTIN 300 MG PO CAPS: 300.0000 mg | ORAL_CAPSULE | Freq: Three times a day (TID) | ORAL | 3 refills | Status: DC

## 2020-11-10 MED ORDER — METOCLOPRAMIDE HCL 5 MG PO TABS: ORAL_TABLET | ORAL | 3 refills | Status: DC

## 2020-11-10 MED ORDER — METOPROLOL SUCCINATE ER 100 MG PO TB24: ORAL_TABLET | ORAL | 3 refills | Status: DC

## 2020-11-10 MED ORDER — AMLODIPINE BESYLATE 5 MG PO TABS: 5.0000 mg | ORAL_TABLET | Freq: Every day | ORAL | 3 refills | Status: DC

## 2020-11-10 MED ORDER — ROSUVASTATIN CALCIUM 10 MG PO TABS: 10.0000 mg | ORAL_TABLET | Freq: Every day | ORAL | 3 refills | Status: DC

## 2020-11-10 MED ORDER — PANTOPRAZOLE SODIUM 40 MG PO TBEC: DELAYED_RELEASE_TABLET | ORAL | 3 refills | Status: DC

## 2020-11-10 NOTE — Progress Notes (Signed)
OFFICE VISIT  11/10/2020  CC:  Chief Complaint  Patient presents with   Follow-up    RCI, pt is not fasting   HPI:    Patient is a 50 y.o. Caucasian female who presents for 3 mo f/u HTN, HLD, and fluid retention/chronic abd bloating.   A/P as of last visit: "1) DM 2 with multiple complications (albuminuria, retinopathy, gastroparesis). Her sCr and electrolytes have consistently been stable, most recent 1 mo ago.  POC HbA1c today 6.8%.   Since I have to draw blood after all (renal fxn needed for her CT abd/pelv w/contrast tomorrow) I'll do venous Hba1c today as well. She has albuminuria --she is tolerating recent increase in verapamil cr to 240 qd. Cont lantus, victoza, and metformin. Plan recheck urine microalb/cr at next f/u in 3 mo.   2) Fluid retention: w/u unrevealing. Echo to be done in about a month. ABD u/s showed fatty liver but o/w normal. GYN MD ordered CT abd/pelv with contrast to be done tomorrow. Will check renal function today in prep for this since it has been almost a month since her last check. Cont lasix 46m qAM and 228mqPM   3) HTN: stable.  Cont amlodipine 5 qd, losartan 100 qd, toprol xl 200 qd, and verapamil SR 240 qd.   4) HLD: tolerating rosuvastatin 1014md. LDL goal <70.  Her LDL was 58 about 4 mo ago. Plan rpt lipids 3 mo."  INTERIM HX: The CT planned by GYN got denied by insurance. Still with constant bloating, some recurrent nausea requiring phenergan.  Inc pressure in upper abd not too long after eating ANY type of food.  NO abd pain. Emesis x 1 d last week.   DM: pt requested referral to endocrinology for mgmt of her DM 08/2020 so I ordered this->Dr. MonAlanson Aly HP.  HTN: home bp consistently   HLD: rosuva 10 every other day.   Fluid retention/abd bloating: echo 08/23/20 normal except grd I DD. She did not get the CT abd planned by her GYN. She does have diabetic gastroparesis.    Reports a few years of gradually worsening pain in  all muscles and joints, quite stiff, waxes and wanes in intensity.  Affects her daily.  Chronic fatigue.  ROS as above, plus--> no fevers, no CP, no SOB, no wheezing, no cough, no dizziness, no HAs, no rashes, no melena/hematochezia.  No polyuria or polydipsia. No focal weakness, paresthesias, or tremors.  No acute vision or hearing abnormalities.  No dysuria or unusual/new urinary urgency or frequency.  No recent changes in lower legs.  No palpitations.    Past Medical History:  Diagnosis Date   Abdominal bloating    likely from diab gastroparesis.  Dr. ArmHavery Morosarted trial of reglan 03/2019.   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  Surveillance MRI 05/2017 --dilated cerebral aqueduct related to aqueductal stenosis and subsequent mild hydrocephalus (due to the 11 mm stable cystic lesion in cerebral aqueduct---?congenitial?.   Cataract    OU   Dysmenorrhea    vicodin occ during first 2 days of cycle.   Gluten intolerance    pt reports she underwent full GI w/u to r/o celiac dz   Hepatic steatosis    ultrasound 08/2017. Hx of very mild elevation of ALT.  Stable on u/s 06/2020   History of adenomatous polyp of colon 04/08/2019   recall Feb 2024   Hyperlipidemia, mixed  Hypertension    +white coat component   Hypertensive retinopathy    OU   Hypertensive retinopathy of both eyes    Insomnia    Iron deficiency 01/2019   Hb 11.3. Hemoccults neg x 3 03/19/19. EGD and colonoscopy 04/08/19 showed NO cause for IDA.  Pt does have menorrhagia, though, so she'll see her GYN.  started FeSO4 325 qd approx 04/14/19.   Menorrhagia    resulting in IDA 2021   Proliferative diabetic retinopathy of both eyes ([place])    steroid injections 10/2017--improved   Sensorineural hearing loss of left ear    Sudden left hearing loss summer 2016--no improvement with steroids 01/2015 so brain MRI done by Dr. Redmond Baseman and it showed brain tumor  that was determined to be benign.  Pt's hearing not bad enough for hearing aid as of 06/2016.   Type 2 diabetes with complication (HCC)    +microalbuminuria, diab retpthy, diabetic gastroparesis (gastric emptying study mildly abnl 03/2017).  Recommended lantus 08/2018 but pt declined. Mild microalbuminuria.   Uterine fibroid    Uterine fibroid 2022   per pt report, pelvic u/s in GYN office    Past Surgical History:  Procedure Laterality Date   ANOSCOPY  05/12/2019   Procedure: normal exam, minimal hemorrhoid disease. Hyertrophied anal papila, benign appearing, posterior midline. Surgeon: Leighton Ruff MD   CHOLECYSTECTOMY  2000   COLONOSCOPY  04/08/2019   5 adenomas, recall 3 yrs; no cause for IDA found.  Hypertrophied anal papillae->bx showed low grade dysplasia; GI referred her to colorectal surgeon.   ESOPHAGOGASTRODUODENOSCOPY  04/08/2019   mild chronic reactive gastritis. H pylori NEG.  No cause for IDA found.   GAS INSERTION Right 10/31/2018   Procedure: Insertion Of C3F8 Gas;  Surgeon: Bernarda Caffey, MD;  Location: [place];  Service: Ophthalmology;  Laterality: Right;   GASTRIC EMPTYING SCAN  04/20/2017   Mildly abnormal, particularly the 1st hour of emptying.   MEMBRANE PEEL Right 10/31/2018   Procedure: MEMBRANE PEEL;  Surgeon: Bernarda Caffey, MD;  Location: [place];  Service: Ophthalmology;  Laterality: Right;   PARS PLANA VITRECTOMY Right 04/12/2018   Procedure: Right PARS PLANA VITRECTOMY WITH 25 GAUGE with intravitreal antibiotics;  Surgeon: Bernarda Caffey, MD;  Location: [place];  Service: Ophthalmology;  Laterality: Right;   PARS PLANA VITRECTOMY Right 10/31/2018   Procedure: PARS PLANA VITRECTOMY WITH 25 GAUGE;  Surgeon: Bernarda Caffey, MD;  Location: [place];  Service: Ophthalmology;  Laterality: Right;   PHOTOCOAGULATION WITH LASER Right 10/31/2018   Procedure: Photocoagulation With Laser;  Surgeon: Bernarda Caffey, MD;  Location: [place];  Service: Ophthalmology;  Laterality: Right;    TRANSTHORACIC ECHOCARDIOGRAM  08/23/2020   Grd I DD, o/w normal.    Outpatient Medications Prior to Visit  Medication Sig Dispense Refill   furosemide (LASIX) 20 MG tablet TAKE 2 TABLETS BY MOUTH IN THE MORNING AND 1 IN THE EVENING 90 tablet 2   LANTUS SOLOSTAR 100 UNIT/ML Solostar Pen INJECT 65-70 UNITS SUBCUTANEOUSLY AT BEDTIME GRADUALLY  TITRATING  TO  THIS  DOSE  TO  GET  TARGET  FASTING  GLUCOSE  RANGE  OF  100-110 15 mL 0   losartan (COZAAR) 100 MG tablet Take 1 tablet by mouth once daily 90 tablet 0   metFORMIN (GLUCOPHAGE) 1000 MG tablet Take 1 tablet (1,000 mg total) by mouth 2 (two) times daily with a meal. 180 tablet 3   Multiple Vitamin (MULTIVITAMIN WITH MINERALS) TABS tablet Take 1 tablet by mouth daily.  OVER THE COUNTER MEDICATION Take 2 capsules by mouth daily. Neu Remedy     promethazine (PHENERGAN) 12.5 MG tablet 1-2 tabs po q6h prn nausea 30 tablet 1   verapamil (CALAN-SR) 240 MG CR tablet Take 1 tablet (240 mg total) by mouth at bedtime. 30 tablet 6   VICTOZA 18 MG/3ML SOPN INJECT 1.2MG INTO THE SKIN DAILY 18 mL 0   HYDROcodone-acetaminophen (NORCO/VICODIN) 5-325 MG tablet Take 1-2 tablets by mouth every 6 (six) hours as needed for moderate pain. (Patient not taking: No sig reported) 40 tablet 0   amLODipine (NORVASC) 5 MG tablet Take 1 tablet by mouth once daily 90 tablet 0   metoCLOPramide (REGLAN) 5 MG tablet TAKE 1 TABLET BY MOUTH 4 TIMES DAILY BEFORE MEAL(S) AND AT BEDTIME 120 tablet 2   metoprolol succinate (TOPROL-XL) 100 MG 24 hr tablet Take 2 tablets by mouth once daily 180 tablet 0   pantoprazole (PROTONIX) 40 MG tablet TAKE 1 TABLET BY MOUTH ONCE DAILY . APPOINTMENT REQUIRED FOR FUTURE REFILLS 90 tablet 0   rosuvastatin (CRESTOR) 10 MG tablet Take 1 tablet by mouth once daily 30 tablet 0   No facility-administered medications prior to visit.    Allergies  Allergen Reactions   Gluten Meal Swelling   Lisinopril Cough   Pioglitazone Other (See Comments)     ELEVATED glucoses + worse chronic nausea    ROS As per HPI  PE: Vitals with BMI 11/10/2020 08/09/2020 07/12/2020  Height _0  _1  _2   Weight 198 lbs 13 oz 190 lbs 13 oz 184 lbs 13 oz  BMI 35.22 58.85 02.77  Systolic 412 878 676  Diastolic 78 65 71  Pulse 87 86 88   Gen: Alert, well appearing.  Patient is oriented to person, place, time, and situation. AFFECT: pleasant, lucid thought and speech. No signif TTP in back or UE's.  Mild TTP thighs and calves. Mild weakness against resistance bilat arms. No joint swelling, redness, or warmth. LEGS with 2+ L LL pitting and trace RLL pitting.  LABS:   Lab Results  Component Value Date   ESRSEDRATE 24 (H) 03/15/2017   Lab Results  Component Value Date   TSH 4.69 (H) 07/02/2020   Lab Results  Component Value Date   WBC 11.3 (H) 07/12/2020   HGB 11.7 07/12/2020   HCT 35.7 07/12/2020   MCV 83.8 07/12/2020   PLT 473 (H) 07/12/2020   Lab Results  Component Value Date   CREATININE 0.64 08/09/2020   BUN 20 08/09/2020   NA 142 08/09/2020   K 4.6 08/09/2020   CL 104 08/09/2020   CO2 27 08/09/2020   Lab Results  Component Value Date   ALT 61 (H) 07/02/2020   AST 34 07/02/2020   ALKPHOS 72 07/02/2020   BILITOT 0.2 07/02/2020   Lab Results  Component Value Date   CHOL 141 04/22/2020   Lab Results  Component Value Date   HDL 43.20 04/22/2020   Lab Results  Component Value Date   LDLCALC 58 04/22/2020   Lab Results  Component Value Date   TRIG 195.0 (H) 04/22/2020   Lab Results  Component Value Date   CHOLHDL 3 04/22/2020   Lab Results  Component Value Date   HGBA1C 7.8 (H) 08/09/2020   IMPRESSION AND PLAN:  1) Widespread musculoskeletal pain, fatigue-->chronic.  Suspect fibromyalgia syndrome. Will check ESR to r/o PMR. Low susp inflammatory arthritis. Start trial of gabapentin: 300 qhs and titrate up by 1 pill every  5d until taking 300 tid.  2) HTN: well controlled on losartan 100 qd, amlod 5 qd,  toprol xl 200 qd, and calan SR 240 qd. Lytes/cr today.  3) Chronic bloating, worse postprandially. Unclear if response to reglan-->she'll try ween off and see if sx's happen to get worse. Abd u/s has been unremarkable. Will ref to GI to see if further eval recommended, possible trial of diff med, etc.  4) DM 2, not well controlled.  Referred to endo but pt got no call to set this up. Will try referring to diff endo (Dr. Kelton Pillar with [facility]). Hba1c today. She has hx of albuminuria and is on max med tx for this (losartan and verapamil).  5) HLD: tolerating rosuvastatin 70m qd. LDL goal <70.  Her LDL was 58 about 7 mo ago. Pt not fasting today.  Plan rpt lipids future o/v when fasting.  6) Preventative health care:  Flu and prevnar 20 vaccines given today.  An After Visit Summary was printed and given to the patient.  FOLLOW UP: Return in about 4 weeks (around 12/08/2020) for f/u fibro.  Signed:  PCrissie Sickles MD           11/10/2020

## 2020-11-11 ENCOUNTER — Other Ambulatory Visit: Payer: Self-pay | Admitting: Family Medicine

## 2020-11-16 ENCOUNTER — Ambulatory Visit (INDEPENDENT_AMBULATORY_CARE_PROVIDER_SITE_OTHER): Payer: 59 | Admitting: Ophthalmology

## 2020-11-16 ENCOUNTER — Other Ambulatory Visit: Payer: Self-pay

## 2020-11-16 ENCOUNTER — Encounter (INDEPENDENT_AMBULATORY_CARE_PROVIDER_SITE_OTHER): Payer: Self-pay | Admitting: Ophthalmology

## 2020-11-16 DIAGNOSIS — I10 Essential (primary) hypertension: Secondary | ICD-10-CM

## 2020-11-16 DIAGNOSIS — E113513 Type 2 diabetes mellitus with proliferative diabetic retinopathy with macular edema, bilateral: Secondary | ICD-10-CM | POA: Diagnosis not present

## 2020-11-16 DIAGNOSIS — H35033 Hypertensive retinopathy, bilateral: Secondary | ICD-10-CM | POA: Diagnosis not present

## 2020-11-16 DIAGNOSIS — H3581 Retinal edema: Secondary | ICD-10-CM

## 2020-11-16 DIAGNOSIS — H44001 Unspecified purulent endophthalmitis, right eye: Secondary | ICD-10-CM

## 2020-11-16 DIAGNOSIS — H25812 Combined forms of age-related cataract, left eye: Secondary | ICD-10-CM

## 2020-11-16 DIAGNOSIS — H4311 Vitreous hemorrhage, right eye: Secondary | ICD-10-CM

## 2020-11-16 DIAGNOSIS — H4313 Vitreous hemorrhage, bilateral: Secondary | ICD-10-CM | POA: Diagnosis not present

## 2020-11-16 DIAGNOSIS — Z961 Presence of intraocular lens: Secondary | ICD-10-CM

## 2020-11-16 DIAGNOSIS — H4312 Vitreous hemorrhage, left eye: Secondary | ICD-10-CM

## 2020-11-16 NOTE — Progress Notes (Signed)
[facility] Note  11/16/2020     CHIEF COMPLAINT Patient presents for Retina Follow Up   HISTORY OF PRESENT ILLNESS: Gloria Gomez is a 50 y.o. female who presents to the clinic today for:  HPI     Retina Follow Up   Patient presents with  Diabetic Retinopathy.  In both eyes.  This started 2 weeks ago.  I, the attending physician,  performed the HPI with the patient and updated documentation appropriately.        Comments   Patient here for 2 weeks retina follow up for PDR OU focal laser OD.  Patient states vision doing ok. OS notices at night when turns over sees shading in OS. Goes away. When uses extra pillow holds head still.  Has new DX fibromyalgia and gabapentin was added. Takes 300 mg at night.       Last edited by Bernarda Caffey, MD on 11/16/2020  4:17 PM.       Referring physician:   HISTORICAL INFORMATION:   Selected notes from the MEDICAL RECORD NUMBER Referred for DM exam   CURRENT MEDICATIONS: No current outpatient medications on file. (Ophthalmic Drugs)   No current facility-administered medications for this visit. (Ophthalmic Drugs)   Current Outpatient Medications (Other)  Medication Sig   amLODipine (NORVASC) 5 MG tablet Take 1 tablet (5 mg total) by mouth daily.   furosemide (LASIX) 20 MG tablet TAKE 2 TABLETS BY MOUTH IN THE MORNING AND 1 IN THE EVENING   gabapentin (NEURONTIN) 300 MG capsule Take 1 capsule (300 mg total) by mouth 3 (three) times daily.   HYDROcodone-acetaminophen (NORCO/VICODIN) 5-325 MG tablet Take 1-2 tablets by mouth every 6 (six) hours as needed for moderate pain. (Patient not taking: No sig reported)   LANTUS SOLOSTAR 100 UNIT/ML Solostar Pen INJECT 65-70 UNITS SUBCUTANEOUSLY AT BEDTIME GRADUALLY  TITRATING  TO  THIS  DOSE  TO  GET  TARGET  FASTING  GLUCOSE  RANGE  OF  100-110   losartan (COZAAR) 100 MG tablet Take 1 tablet by mouth once daily   metFORMIN (GLUCOPHAGE) 1000 MG tablet Take 1 tablet  (1,000 mg total) by mouth 2 (two) times daily with a meal.   metoCLOPramide (REGLAN) 5 MG tablet TAKE 1 TABLET BY MOUTH 4 TIMES DAILY BEFORE MEAL(S) AND AT BEDTIME   metoprolol succinate (TOPROL-XL) 100 MG 24 hr tablet Take 2 tablets by mouth once daily   Multiple Vitamin (MULTIVITAMIN WITH MINERALS) TABS tablet Take 1 tablet by mouth daily.   OVER THE COUNTER MEDICATION Take 2 capsules by mouth daily. Neu Remedy   pantoprazole (PROTONIX) 40 MG tablet TAKE 1 TABLET BY MOUTH ONCE DAILY .   promethazine (PHENERGAN) 12.5 MG tablet 1-2 tabs po q6h prn nausea   rosuvastatin (CRESTOR) 10 MG tablet Take 1 tablet (10 mg total) by mouth daily.   verapamil (CALAN-SR) 240 MG CR tablet Take 1 tablet (240 mg total) by mouth at bedtime.   VICTOZA 18 MG/3ML SOPN INJECT 1.2MG INTO THE SKIN DAILY   No current facility-administered medications for this visit. (Other)    REVIEW OF SYSTEMS: ROS   Positive for: Endocrine, Eyes Negative for: Constitutional, Gastrointestinal, Neurological, Skin, Genitourinary, Musculoskeletal, HENT, Cardiovascular, Respiratory, Psychiatric, Allergic/Imm, Heme/Lymph Last edited by Theodore Demark, COA on 11/16/2020  1:52 PM.       ALLERGIES Allergies  Allergen Reactions   Gluten Meal Swelling   Lisinopril Cough   Pioglitazone Other (See Comments)  ELEVATED glucoses + worse chronic nausea    PAST MEDICAL HISTORY Past Medical History:  Diagnosis Date   Abdominal bloating    likely from diab gastroparesis.  Dr. Havery Moros started trial of reglan 03/2019.   Benign brain tumor ([place])    Cystic lesion in cerebral aqueduct region with mild hydrocephalus-- stable MRI 02/2016.  Surveillance MRI 05/2017 --dilated cerebral aqueduct related to aqueductal stenosis and subsequent mild hydrocephalus (due to the 11 mm stable cystic lesion in cerebral aqueduct---?congenitial?.   Cataract    OU   Dysmenorrhea    vicodin occ during first 2 days of cycle.   Gluten intolerance    pt  reports she underwent full GI w/u to r/o celiac dz   Hepatic steatosis    ultrasound 08/2017. Hx of very mild elevation of ALT.  Stable on u/s 06/2020   History of adenomatous polyp of colon 04/08/2019   recall Feb 2024   Hyperlipidemia, mixed    Hypertension    +white coat component   Hypertensive retinopathy    OU   Hypertensive retinopathy of both eyes    Insomnia    Iron deficiency 01/2019   Hb 11.3. Hemoccults neg x 3 03/19/19. EGD and colonoscopy 04/08/19 showed NO cause for IDA.  Pt does have menorrhagia, though, so she'll see her GYN.  started FeSO4 325 qd approx 04/14/19.   Menorrhagia    resulting in IDA 2021   Proliferative diabetic retinopathy of both eyes ([place])    steroid injections 10/2017--improved   Sensorineural hearing loss of left ear    Sudden left hearing loss summer 2016--no improvement with steroids 01/2015 so brain MRI done by Dr. Redmond Baseman and it showed brain tumor that was determined to be benign.  Pt's hearing not bad enough for hearing aid as of 06/2016.   Type 2 diabetes with complication (HCC)    +microalbuminuria, diab retpthy, diabetic gastroparesis (gastric emptying study mildly abnl 03/2017).  Recommended lantus 08/2018 but pt declined. Mild microalbuminuria.   Uterine fibroid    Uterine fibroid 2022   per pt report, pelvic u/s in GYN office   Past Surgical History:  Procedure Laterality Date   ANOSCOPY  05/12/2019   Procedure: normal exam, minimal hemorrhoid disease. Hyertrophied anal papila, benign appearing, posterior midline. Surgeon: Leighton Ruff MD   CHOLECYSTECTOMY  2000   COLONOSCOPY  04/08/2019   5 adenomas, recall 3 yrs; no cause for IDA found.  Hypertrophied anal papillae->bx showed low grade dysplasia; GI referred her to colorectal surgeon.   ESOPHAGOGASTRODUODENOSCOPY  04/08/2019   mild chronic reactive gastritis. H pylori NEG.  No cause for IDA found.   GAS INSERTION Right 10/31/2018   Procedure: Insertion Of C3F8 Gas;  Surgeon: Bernarda Caffey,  MD;  Location: [place];  Service: Ophthalmology;  Laterality: Right;   GASTRIC EMPTYING SCAN  04/20/2017   Mildly abnormal, particularly the 1st hour of emptying.   MEMBRANE PEEL Right 10/31/2018   Procedure: MEMBRANE PEEL;  Surgeon: Bernarda Caffey, MD;  Location: [place];  Service: Ophthalmology;  Laterality: Right;   PARS PLANA VITRECTOMY Right 04/12/2018   Procedure: Right PARS PLANA VITRECTOMY WITH 25 GAUGE with intravitreal antibiotics;  Surgeon: Bernarda Caffey, MD;  Location: [place];  Service: Ophthalmology;  Laterality: Right;   PARS PLANA VITRECTOMY Right 10/31/2018   Procedure: PARS PLANA VITRECTOMY WITH 25 GAUGE;  Surgeon: Bernarda Caffey, MD;  Location: [place];  Service: Ophthalmology;  Laterality: Right;   PHOTOCOAGULATION WITH LASER Right 10/31/2018   Procedure: Photocoagulation With  Laser;  Surgeon: Bernarda Caffey, MD;  Location: [place];  Service: Ophthalmology;  Laterality: Right;   TRANSTHORACIC ECHOCARDIOGRAM  08/23/2020   Grd I DD, o/w normal.    FAMILY HISTORY Family History  Problem Relation Age of Onset   Brain cancer Mother    Diabetes Father    Diabetes Maternal Grandmother    Cataracts Maternal Grandmother    Cervical cancer Paternal Grandmother    Colon cancer Maternal Grandfather 10   Amblyopia Neg Hx    Blindness Neg Hx    Glaucoma Neg Hx    Macular degeneration Neg Hx    Retinal detachment Neg Hx    Strabismus Neg Hx    Retinitis pigmentosa Neg Hx    Esophageal cancer Neg Hx    Stomach cancer Neg Hx    Rectal cancer Neg Hx     SOCIAL HISTORY Social History   Tobacco Use   Smoking status: Never   Smokeless tobacco: Never  Vaping Use   Vaping Use: Never used  Substance Use Topics   Alcohol use: No   Drug use: No         OPHTHALMIC EXAM:  Base Eye Exam     Visual Acuity (Snellen - Linear)       Right Left   Dist Limon 20/20 -2    Dist cc  20/30   Dist ph cc  20/25 +2    Correction: Glasses         Tonometry (Tonopen, 1:46 PM)       Right  Left   Pressure 18 17         Pupils       Dark Light Shape React APD   Right 3 2 Round Minimal None   Left 3 2 Round Minimal None         Visual Fields (Counting fingers)       Left Right    Full Full         Extraocular Movement       Right Left    Full, Ortho Full, Ortho         Neuro/Psych     Oriented x3: Yes   Mood/Affect: Normal         Dilation     Right eye:            Slit Lamp and Fundus Exam     Slit Lamp Exam       Right Left   Lids/Lashes Dermatochalasis - upper lid, mild Meibomian gland dysfunction, Telangiectasia Dermatochalasis - upper lid, Meibomian gland dysfunction, Telangiectasia   Conjunctiva/Sclera White and quiet White and quiet   Cornea Clear, well healed temporal cataract wounds Trace Punctate epithelial erosions   Anterior Chamber Deep and quiet Deep and quiet   Iris Round and dilated, No NVI Round and dilated, No NVI   Lens PC IOL in good position, 1-2+ Posterior capsular opacification, PC folds 2-3+ Nuclear sclerosis, 2-3+ Cortical cataract   Vitreous post vitrectomy, trace pigment Vitreous syneresis, blood stained vitreous condensations improving centrally and settling inferiorly, residual blood clots inferiorly - improving, persistent blood stained floater just IN to disc - improving--now white in color         Fundus Exam       Right Left   Disc mild Pallor, Sharp rim, temporal PPA Pink and sharp, Compact, PPA   C/D Ratio 0.2 0.0   Macula Flat, good foveal reflex, mild cystic changes ST to fovea -- persistent, persistent  punctate exudate, scattered MA ST mac -- improved, focal MAs temporal macula--good targets for focal laser. Flat, good foveal reflex, trace cystic changes -- vastly improved, scattered MA; trace ERM, light focal laser changes   Vessels attenuated, Tortuous attenuated, mild Tortuousity   Periphery Attached, scattered DBH greatest superiorly, 360 PRP, good laser fill in 360 attached, scattered IRH;  360 PRP scars -- with good fill in changes           Refraction     Wearing Rx       Sphere Cylinder   Right -3.75 Sphere   Left -3.75 Sphere            IMAGING AND PROCEDURES  Imaging and Procedures for _0 @  OCT, Retina - OU - Both Eyes       Right Eye Quality was good. Central Foveal Thickness: 311. Progression has improved. Findings include abnormal foveal contour, epiretinal membrane, no SRF, intraretinal hyper-reflective material, intraretinal fluid (Interval improvement in IRF temporal macula).   Left Eye Quality was good. Central Foveal Thickness: 276. Progression has been stable. Findings include normal foveal contour, no SRF, intraretinal hyper-reflective material, intraretinal fluid (Interval improvement in IRF centrally, just trace cystic changes temporal macula visible on widefield).   Notes *Images captured and stored on drive  Diagnosis / Impression:  DME OU OD: Interval improvement in IRF temporal macula OS: Interval improvement in IRF centrally, just trace cystic changes temporal macula visible on widefield  Clinical management:  See below  Abbreviations: NFP - Normal foveal profile. CME - cystoid macular edema. PED - pigment epithelial detachment. IRF - intraretinal fluid. SRF - subretinal fluid. EZ - ellipsoid zone. ERM - epiretinal membrane. ORA - outer retinal atrophy. ORT - outer retinal tubulation. SRHM - subretinal hyper-reflective material       Focal Laser - OD - Right Eye       LASER PROCEDURE NOTE  Diagnosis:   Diabetic macular edema, RIGHT EYE  Procedure:  Focal laser photocoagulation using slit lamp laser, RIGHT EYE  Anesthesia:  Topical  Surgeon: Bernarda Caffey, MD, PhD   Informed consent obtained, operative eye marked, and time out performed prior to initiation of laser.   Lumenis [id number] Focal/Grid laser Power: 80 mW Duration: 50 msec  Spot size: 100 microns  # spots: 83 spots placed to MAs temporal  macula.  Complications: None.  RTC: 2-3 wks  Patient tolerated the procedure well and received written and verbal post-procedure care information/education.              ASSESSMENT/PLAN:    ICD-10-CM   1. Proliferative diabetic retinopathy of both eyes with macular edema associated with type 2 diabetes mellitus (HCC)  U72.5366 Focal Laser - OD - Right Eye    2. Retinal edema  H35.81 OCT, Retina - OU - Both Eyes    3. Vitreous hemorrhage of left eye (HCC)  H43.12     4. Right endophthalmia  H44.001     5. Vitreous hemorrhage, right eye ([place])  H43.11     6. Essential hypertension  I10     7. Hypertensive retinopathy of both eyes  H35.033     8. Combined forms of age-related cataract of left eye  H25.812     9. Pseudophakia of right eye  Z96.1      1,2.  Proliferative diabetic retinopathy w/ DME, OU  - HbA1c 7.8% (06.13.22), 9.1% (02.24.22) 11.0% (11.22.21)  - s/p IVA OD #1 9.20.19, #2 (10.25.19), #3 (11.15.19), #4 (12.17.19), #5 (  01.14.20), #6 (2.11.20), #7 (05.29.20), #8 (08.19.20), #9 (10.30.20), #10 (12.09.20), #11 (01.11.21), #12 (02.15.21), #13 (03.23.21), #14 (04.20.21), #15 (06.08.21), #16 (07.06.21) -- IVA resistance  - s/p IVA OS #1 9.27.19, #2 (10.25.19), #3 (11.15.19), #4 (12.17.19), #5 (01.14.20), #6 (2.11.20), #7 (04.26.20), #8 (05.29.20), #9 (06.26.20), #10 (08.05.20), #11 (11.11.20), #12 (12.09.20), #13 (01.11.21), #14 (02.15.21), #15 (03.23.21), #16 (04.20.21), #17 (06.08.21) -- IVA resistance, #18 (09.27.21)  - s/p IVE OD #1 (08.03.21) -- sample, #2 (09.10.21), #3 (10.08.21), #4 (11.23.21), #5 (12.21.21), #6 (01.19.22) sample, #7 (2.16.22), #8 (3.22.22), #9 (04.19.22), #10 (05.27.22), #11 (06.29.22), #12 (08.03.22), #13 (9.7.22)  - s/p IVE OS #1 (10.25.21), #2 (11.23.21), #3 (12.21.21), #4 (3.22.22), #5 (06.29.22), #6 (08.03.22), #7 (9.7.22)  - S/P PRP OS (09.20.19), (5.19.20), (08.19.20), (04.28.21), (02.02.22)  - S/P PRP OD (9.27.19 and 11.21.19), fill-in  (04.14.20) (09.03.20, surgery)  - S/P focal laser OS (07.06.21)  - FA (9.20.19) shows +NVE OU and leaking MA and capillary nonperfusion  - repeat FA 11.15.19 shows NV regressing OU  - pre-op: OD w/ VA stable at 20/25, but there is some preretinal fibrosis / tractional membranes just superior to disc and mild central DME  - s/p 25g PPV+MP+10% C3F8 gas OD (09.03.20) -- ERM/PRF removal OD  - BCVA stable at 20/25 OU             - fibrosis/ERM stably improved; retina attached  - OCT shows OD: Persistent IRF temporal macula--improved; OS: Interval improvement in IRF centrally, just trace cystic changes temporal macula visible on widefield at 5 weeks  - discussed possible benefit of focal laser OD  - recommend focal laser OD today, 9.20.22 -- possible decrease anti-VEGF injection burden  - pt in agreement  - RBA of procedure discussed, questions answered  - informed consent obtained   - see procedure note  - Avast[place] informed consent form signed and scanned on 01.11.2021 (OU)  - Eylea informed consent form signed and scanned on 08.03.2021  - [email or web address] benefits investigation started, 08.03.21 -- approved as of 09.10.21 -- 2022 insurance verified as of 02.16.22  - f/u 2-3 weeks, DFE, OCT, possible injection  3. Vitreous hemorrhage OS -- recurrent, improved  - recurrent VH, onset 09.22.21  - etiology: secondary to PDR as described above (no RT/RD on exam)  - s/p PRP OS (9.20.19), (05.19.20), (08.19.20), (04.28.21), (02.02.22)  - s/p IVA OS on 4.26.20, 5.29.20, 6.26.20, 8.5.20,11.11.20, 12.06.20 and so on as above  - VH clear centrally and settled inferiorly -- now white  - BCVA stable at 20/25  - f/u 5 weeks DFE, OCT, possible injection  4. History of Endophthalmitis OD  - s/p IVA OU 04/09/2018  - s/p 25g PPV w/ intravitreal vanc, ceftaz and cefepime OD, 2.14.2020  - s/p intravitreal tap / vanc and ceflaz injections (02.16.20)             - gram stain (2.14.20) shows G+ cocci, WBCs mostly PMNs;    - repeat gram stain from t/i (2.16.20) -- no organisms, just WBCs             - cultures from vitreous grew rare Staph warneri; cultures from t/i -- no growth             - doing well, BCVA 20/25             - inflammation/posterior debris resolved             - IOP 10 off Brimonidine  - completed po pred taper -- caused  significant elevations in BG  - monitor  5. History of Vitreous Hemorrhage OD -- cleared from PPV x2 for endophthalmitis and ERM/preretinal fibrosis  - secondary to PDR as described below  - S/P IVA OD #1 (09.20.19), #2 (10.25.19), #3 (11.15.19), #4 (12.16.19), #5 (03/12/2018)  - S/P PRP OD #1 (09.27.19), fill in OD (11.21.19) -- each somewhat limited inferiorly by residual VH  6,7. Hypertensive retinopathy OU  - BP elevated today (9.27.21) -- 164/91, left arm  - discussed importance of tight BP control.  - monitor  8. Combined form age related cataract OS-   - The symptoms of cataract, surgical options, and treatments and risks were discussed with patient.  - discussed diagnosis and progression  9. Pseudophakia OD  - s/p CE/IOL OD (Dr. Zenia Resides, 12.11.20)  - beautiful surgery, doing well  Ophthalmic Meds Ordered this visit:  No orders of the defined types were placed in this encounter.     Return for 2-3 wks, Dilated Exam, OCT, Possible Injxn.  There are no Patient Instructions on file for this visit.  This document serves as a record of services personally performed by Gardiner Sleeper, MD, PhD. It was created on their behalf by Estill Bakes, COT an ophthalmic technician. The creation of this record is the provider's dictation and/or activities during the visit.    Electronically signed by: Estill Bakes, COT 9.20.22 @ 1:54 AM   Gardiner Sleeper, M.D., Ph.D. Diseases & Surgery of the Retina and Vitreous [facility]  I have reviewed the above documentation for accuracy and completeness, and I agree with the above. Gardiner Sleeper,  M.D., Ph.D. 11/17/20 1:54 AM   Abbreviations: M myopia (nearsighted); A astigmatism; H hyperopia (farsighted); P presbyopia; Mrx spectacle prescription;  CTL contact lenses; OD right eye; OS left eye; OU both eyes  XT exotropia; ET esotropia; PEK punctate epithelial keratitis; PEE punctate epithelial erosions; DES dry eye syndrome; MGD meibomian gland dysfunction; ATs artificial tears; PFAT's preservative free artificial tears; [place] nuclear sclerotic cataract; PSC posterior subcapsular cataract; ERM epi-retinal membrane; PVD posterior vitreous detachment; RD retinal detachment; DM diabetes mellitus; DR diabetic retinopathy; NPDR non-proliferative diabetic retinopathy; PDR proliferative diabetic retinopathy; CSME clinically significant macular edema; DME diabetic macular edema; dbh dot blot hemorrhages; CWS cotton wool spot; POAG primary open angle glaucoma; C/D cup-to-disc ratio; HVF humphrey visual field; GVF goldmann visual field; OCT optical coherence tomography; IOP intraocular pressure; BRVO Branch retinal vein occlusion; CRVO central retinal vein occlusion; CRAO central retinal artery occlusion; BRAO branch retinal artery occlusion; RT retinal tear; SB scleral buckle; PPV pars plana vitrectomy; VH Vitreous hemorrhage; PRP panretinal laser photocoagulation; IVK intravitreal kenalog; VMT vitreomacular traction; MH Macular hole;  NVD neovascularization of the disc; NVE neovascularization elsewhere; AREDS age related eye disease study; ARMD age related macular degeneration; POAG primary open angle glaucoma; EBMD epithelial/anterior basement membrane dystrophy; ACIOL anterior chamber intraocular lens; IOL intraocular lens; PCIOL posterior chamber intraocular lens; Phaco/IOL phacoemulsification with intraocular lens placement; [place] photorefractive keratectomy; LASIK laser assisted in situ keratomileusis; HTN hypertension; DM diabetes mellitus; COPD chronic obstructive pulmonary disease

## 2020-11-18 ENCOUNTER — Telehealth: Payer: Self-pay

## 2020-11-18 ENCOUNTER — Encounter: Payer: Self-pay | Admitting: Family Medicine

## 2020-11-18 ENCOUNTER — Other Ambulatory Visit: Payer: Self-pay | Admitting: Family Medicine

## 2020-11-18 LAB — ANA SCREEN,IFA,REFLEX TITER/PATTERN,REFLEX MPLX 11 AB CASCADE
14-3-3 eta Protein: <0.2 ng/mL (ref <0.2)
Anti Nuclear Antibody (ANA): NEGATIVE
Cyclic Citrullin Peptide Ab: <16 UNITS
Rheumatoid fact SerPl-aCnc: <14 IU/mL (ref <14)

## 2020-11-18 LAB — T3: T3, Total: 129 ng/dL (ref 76–181)

## 2020-11-18 LAB — TEST AUTHORIZATION

## 2020-11-18 MED ORDER — PREDNISONE 20 MG PO TABS: 20.0000 mg | ORAL_TABLET | Freq: Every day | ORAL | 0 refills | Status: DC

## 2020-11-18 NOTE — Telephone Encounter (Signed)
Pt returned call regarding labs, advised provider would follow up when available to call back. She voiced understanding.   Tammi Sou, MD  11/18/2020  8:54 AM EDT Back to Top    I called pt and LM on VM stating I'd call back again later

## 2020-11-18 NOTE — Progress Notes (Signed)
noted 

## 2020-11-19 NOTE — Telephone Encounter (Signed)
Pt returned call and advised of recommendations. She was also given contact information for [place], [phone]

## 2020-11-19 NOTE — Telephone Encounter (Signed)
LM for pt to return call.  [7:23 [place] call Gloria Gomez and tell her she can call [facility] endocrinology to schedule appt with Dr. Kelton Pillar: [phone].

## 2020-11-25 ENCOUNTER — Telehealth: Payer: Self-pay

## 2020-11-25 MED ORDER — LANTUS SOLOSTAR 100 UNIT/ML ~~LOC~~ SOPN: PEN_INJECTOR | SUBCUTANEOUS | 0 refills | Status: DC

## 2020-11-25 NOTE — Telephone Encounter (Signed)
RF request for Lantus  LOV: 11/10/20 Next ov: 12/09/20 Last written: 11/09/20(52mL,0)  Please review and advise, med pending

## 2020-11-25 NOTE — Telephone Encounter (Signed)
Refill sent and patient was made aware of completion.

## 2020-11-25 NOTE — Telephone Encounter (Signed)
Ok to rf as previously rx'd, rf x 1

## 2020-11-25 NOTE — Telephone Encounter (Signed)
  Encourage patient to contact the pharmacy for refills or they can request refills through [place]: 11/10/20  NEXT APPOINTMENT DATE: 12/09/20  MEDICATION: LANTUS SOLOSTAR 100 UNIT/ML Solostar Pen  COMMENTS: Patient states she is on prednisone currently and it is causing her blood sugar to go higher than normal which is then making her run out quicker than normal. She has been having to take about 90, requesting a short supply to last until she comes in for an appointment on the 13th.  PHARMACY: [place], [place]

## 2020-11-30 NOTE — Progress Notes (Addendum)
[facility] Note  12/06/2020     CHIEF COMPLAINT Patient presents for Retina Follow Up   HISTORY OF PRESENT ILLNESS: Gloria Gomez is a 50 y.o. female who presents to the clinic today for:  HPI     Retina Follow Up   Patient presents with  Diabetic Retinopathy.  In both eyes.  This started years ago.  Severity is moderate.  Duration of 3 weeks.  Since onset it is stable.  I, the attending physician,  performed the HPI with the patient and updated documentation appropriately.        Comments   50 y/o female pt here for 3 wk f/u for PDR OU.  No change in [place] OU.  Denies pain, FOL, floaters.  No gtts.  BS 130 this a.m.  A1C 8.6 4 wks ago.  One of pt's docs wants to put her on Prednisone for a form of arthritis, but pt is hesitant to due to the possibility of it making her BS spike.  Pt has recently had to double her insulin.       Last edited by Bernarda Caffey, MD on 12/07/2020 10:37 PM.    Pt states her dr started her on 40m of po prednisone for 2.5 weeks for arthritis, pt states the medication has helped with her joint stiffness, but she has had to double her insulin bc of it, she states her blood sugar was 130 this am    Referring physician:   HISTORICAL INFORMATION:   Selected notes from the MEDICAL RECORD NUMBER Referred for DM exam   CURRENT MEDICATIONS: No current outpatient medications on file. (Ophthalmic Drugs)   No current facility-administered medications for this visit. (Ophthalmic Drugs)   Current Outpatient Medications (Other)  Medication Sig   amLODipine (NORVASC) 5 MG tablet Take 1 tablet (5 mg total) by mouth daily.   furosemide (LASIX) 20 MG tablet TAKE 2 TABLETS BY MOUTH IN THE MORNING AND 1 IN THE EVENING   gabapentin (NEURONTIN) 300 MG capsule Take 1 capsule (300 mg total) by mouth 3 (three) times daily.   HYDROcodone-acetaminophen (NORCO/VICODIN) 5-325 MG tablet Take 1-2 tablets by mouth every 6 (six) hours as needed  for moderate pain. (Patient not taking: No sig reported)   LANTUS SOLOSTAR 100 UNIT/ML Solostar Pen INJECT 65-70 UNITS SUBCUTANEOUSLY AT BEDTIME GRADUALLY  TITRATING  TO  THIS  DOSE  TO  GET  TARGET  FASTING  GLUCOSE  RANGE  OF  100-110   losartan (COZAAR) 100 MG tablet Take 1 tablet by mouth once daily   metFORMIN (GLUCOPHAGE) 1000 MG tablet Take 1 tablet (1,000 mg total) by mouth 2 (two) times daily with a meal.   metoCLOPramide (REGLAN) 5 MG tablet TAKE 1 TABLET BY MOUTH 4 TIMES DAILY BEFORE MEAL(S) AND AT BEDTIME   metoprolol succinate (TOPROL-XL) 100 MG 24 hr tablet Take 2 tablets by mouth once daily   Multiple Vitamin (MULTIVITAMIN WITH MINERALS) TABS tablet Take 1 tablet by mouth daily.   OVER THE COUNTER MEDICATION Take 2 capsules by mouth daily. Neu Remedy   pantoprazole (PROTONIX) 40 MG tablet TAKE 1 TABLET BY MOUTH ONCE DAILY .   predniSONE (DELTASONE) 20 MG tablet Take 1 tablet (20 mg total) by mouth daily with breakfast.   promethazine (PHENERGAN) 12.5 MG tablet 1-2 tabs po q6h prn nausea   rosuvastatin (CRESTOR) 10 MG tablet Take 1 tablet (10 mg total) by mouth daily.   verapamil (CALAN-SR) 240 MG CR tablet  Take 1 tablet (240 mg total) by mouth at bedtime.   VICTOZA 18 MG/3ML SOPN INJECT 1.2MG INTO THE SKIN DAILY   No current facility-administered medications for this visit. (Other)    REVIEW OF SYSTEMS: ROS   Positive for: Endocrine, Eyes Negative for: Constitutional, Gastrointestinal, Neurological, Skin, Genitourinary, Musculoskeletal, HENT, Cardiovascular, Respiratory, Psychiatric, Allergic/Imm, Heme/Lymph Last edited by Matthew Folks, COA on 12/06/2020  2:10 PM.        ALLERGIES Allergies  Allergen Reactions   Gluten Meal Swelling   Lisinopril Cough   Pioglitazone Other (See Comments)    ELEVATED glucoses + worse chronic nausea    PAST MEDICAL HISTORY Past Medical History:  Diagnosis Date   Abdominal bloating    likely from diab gastroparesis.  Dr.  Havery Moros started trial of reglan 03/2019.   Benign brain tumor ([place])    Cystic lesion in cerebral aqueduct region with mild hydrocephalus-- stable MRI 02/2016.  Surveillance MRI 05/2017 --dilated cerebral aqueduct related to aqueductal stenosis and subsequent mild hydrocephalus (due to the 11 mm stable cystic lesion in cerebral aqueduct---?congenitial?.   Cataract    OU   Dysmenorrhea    vicodin occ during first 2 days of cycle.   Gluten intolerance    pt reports she underwent full GI w/u to r/o celiac dz   Hepatic steatosis    ultrasound 08/2017. Hx of very mild elevation of ALT.  Stable on u/s 06/2020   History of adenomatous polyp of colon 04/08/2019   recall Feb 2024   Hyperlipidemia, mixed    Hypertension    +white coat component   Hypertensive retinopathy of both eyes    Insomnia    Iron deficiency 01/2019   Hb 11.3. Hemoccults neg x 3 03/19/19. EGD and colonoscopy 04/08/19 showed NO cause for IDA.  Pt does have menorrhagia, though, so she'll see her GYN.  started FeSO4 325 qd approx 04/14/19.   Menorrhagia    resulting in IDA 2021   Proliferative diabetic retinopathy of both eyes ([place])    steroid injections 10/2017--improved   Sensorineural hearing loss of left ear    Sudden left hearing loss summer 2016--no improvement with steroids 01/2015 so brain MRI done by Dr. Redmond Baseman and it showed brain tumor that was determined to be benign.  Pt's hearing not bad enough for hearing aid as of 06/2016.   Type 2 diabetes with complication (HCC)    +microalbuminuria, diab retpthy, diabetic gastroparesis (gastric emptying study mildly abnl 03/2017).  Recommended lantus 08/2018 but pt declined. Mild microalbuminuria.   Uterine fibroid 2022   per pt report, pelvic u/s in GYN office   Past Surgical History:  Procedure Laterality Date   ANOSCOPY  05/12/2019   Procedure: normal exam, minimal hemorrhoid disease. Hyertrophied anal papila, benign appearing, posterior midline. Surgeon: Leighton Ruff MD    CHOLECYSTECTOMY  2000   COLONOSCOPY  04/08/2019   5 adenomas, recall 3 yrs; no cause for IDA found.  Hypertrophied anal papillae->bx showed low grade dysplasia; GI referred her to colorectal surgeon.   ESOPHAGOGASTRODUODENOSCOPY  04/08/2019   mild chronic reactive gastritis. H pylori NEG.  No cause for IDA found.   GAS INSERTION Right 10/31/2018   Procedure: Insertion Of C3F8 Gas;  Surgeon: Bernarda Caffey, MD;  Location: [facility];  Service: Ophthalmology;  Laterality: Right;   GASTRIC EMPTYING SCAN  04/20/2017   Mildly abnormal, particularly the 1st hour of emptying.   MEMBRANE PEEL Right 10/31/2018   Procedure: MEMBRANE PEEL;  Surgeon: Bernarda Caffey, MD;  Location: [facility];  Service: Ophthalmology;  Laterality: Right;   PARS PLANA VITRECTOMY Right 04/12/2018   Procedure: Right PARS PLANA VITRECTOMY WITH 25 GAUGE with intravitreal antibiotics;  Surgeon: Bernarda Caffey, MD;  Location: [facility];  Service: Ophthalmology;  Laterality: Right;   PARS PLANA VITRECTOMY Right 10/31/2018   Procedure: PARS PLANA VITRECTOMY WITH 25 GAUGE;  Surgeon: Bernarda Caffey, MD;  Location: [facility];  Service: Ophthalmology;  Laterality: Right;   PHOTOCOAGULATION WITH LASER Right 10/31/2018   Procedure: Photocoagulation With Laser;  Surgeon: Bernarda Caffey, MD;  Location: [facility];  Service: Ophthalmology;  Laterality: Right;   TRANSTHORACIC ECHOCARDIOGRAM  08/23/2020   Grd I DD, o/w normal.    FAMILY HISTORY Family History  Problem Relation Age of Onset   Brain cancer Mother    Diabetes Father    Diabetes Maternal Grandmother    Cataracts Maternal Grandmother    Cervical cancer Paternal Grandmother    Colon cancer Maternal Grandfather 64   Amblyopia Neg Hx    Blindness Neg Hx    Glaucoma Neg Hx    Macular degeneration Neg Hx    Retinal detachment Neg Hx    Strabismus Neg Hx    Retinitis pigmentosa Neg Hx    Esophageal cancer Neg Hx    Stomach cancer Neg Hx    Rectal cancer Neg Hx     SOCIAL HISTORY Social History    Tobacco Use   Smoking status: Never   Smokeless tobacco: Never  Vaping Use   Vaping Use: Never used  Substance Use Topics   Alcohol use: No   Drug use: No       OPHTHALMIC EXAM:  Base Eye Exam     Visual Acuity (Snellen - Linear)       Right Left   Dist [place] 20/25    Dist cc  20/25   Dist ph [place] NI    Dist ph cc  NI    Correction: Glasses         Tonometry (Tonopen, 2:11 PM)       Right Left   Pressure 15 16         Pupils       Dark Light Shape React APD   Right 3 2 Round Brisk None   Left 3 2 Round Brisk None         Visual Fields (Counting fingers)       Left Right    Full Full         Extraocular Movement       Right Left    Full, Ortho Full, Ortho         Neuro/Psych     Oriented x3: Yes   Mood/Affect: Normal         Dilation     Both eyes: 1.0% Mydriacyl, 2.5% Phenylephrine @ 2:11 PM           Slit Lamp and Fundus Exam     Slit Lamp Exam       Right Left   Lids/Lashes Dermatochalasis - upper lid, mild Meibomian gland dysfunction, Telangiectasia Dermatochalasis - upper lid, Meibomian gland dysfunction, Telangiectasia   Conjunctiva/Sclera White and quiet White and quiet   Cornea Clear, well healed temporal cataract wounds Trace Punctate epithelial erosions   Anterior Chamber Deep and quiet Deep and quiet   Iris Round and dilated, No NVI Round and dilated, No NVI   Lens PC IOL in good position, 1-2+ Posterior capsular opacification, PC folds 2-3+  Nuclear sclerosis, 2-3+ Cortical cataract   Vitreous post vitrectomy, trace pigment Vitreous syneresis, blood stained vitreous condensations improving centrally and settling inferiorly, residual blood clots inferiorly - improving, persistent blood stained floater just IN to disc - improving--now white in color         Fundus Exam       Right Left   Disc mild Pallor, Sharp rim, temporal PPA Pink and sharp, Compact, PPA   C/D Ratio 0.2 0.0   Macula Flat, good foveal reflex,  mild cystic changes ST to fovea -- slighlty increased, persistent punctate exudate, scattered MA ST mac -- improved, focal MAs temporal macula, good early focal laser changes Flat, good foveal reflex, trace cystic changes -- slightly increased, scattered MA; trace ERM, light focal laser changes   Vessels attenuated, Tortuous attenuated, mild Tortuousity   Periphery Attached, scattered DBH greatest superiorly, 360 PRP, good laser fill in 360 attached, scattered IRH; 360 PRP scars -- with good fill in changes           IMAGING AND PROCEDURES  Imaging and Procedures for _0 @  OCT, Retina - OU - Both Eyes       Right Eye Quality was good. Central Foveal Thickness: 338. Progression has worsened. Findings include abnormal foveal contour, epiretinal membrane, no SRF, intraretinal hyper-reflective material, intraretinal fluid (Interval increase in IRF temporal macula).   Left Eye Quality was good. Central Foveal Thickness: 276. Progression has been stable. Findings include normal foveal contour, no SRF, intraretinal hyper-reflective material, intraretinal fluid (stable improvement in IRF centrally, just trace non-central cystic changes temporal macula visible on widefield).   Notes *Images captured and stored on drive  Diagnosis / Impression:  DME OU OD: Interval increase in IRF temporal macula OS: stable improvement in IRF centrally, just trace non-central cystic changes temporal macula visible on widefield  Clinical management:  See below  Abbreviations: NFP - Normal foveal profile. CME - cystoid macular edema. PED - pigment epithelial detachment. IRF - intraretinal fluid. SRF - subretinal fluid. EZ - ellipsoid zone. ERM - epiretinal membrane. ORA - outer retinal atrophy. ORT - outer retinal tubulation. SRHM - subretinal hyper-reflective material       Intravitreal Injection, Pharmacologic Agent - OD - Right Eye       Time Out 12/06/2020. 2:47 PM. Confirmed correct patient,  procedure, site, and patient consented.   Anesthesia Topical anesthesia was used. Anesthetic medications included Lidocaine 2%, Proparacaine 0.5%.   Procedure Preparation included 5% betadine to ocular surface, eyelid speculum. A (32g) needle was used.   Injection: 2 mg aflibercept 2 MG/0.05ML   Route: Intravitreal, Site: Right Eye   NDC: A3590391, Lot: [id number], Expiration date: 10/26/2021, Waste: 0.05 mL   Post-op Post injection exam found visual acuity of at least counting fingers. The patient tolerated the procedure well. There were no complications. The patient received written and verbal post procedure care education. Post injection medications were not given.      Intravitreal Injection, Pharmacologic Agent - OS - Left Eye       Time Out 12/06/2020. 2:48 PM. Confirmed correct patient, procedure, site, and patient consented.   Anesthesia Topical anesthesia was used. Anesthetic medications included Lidocaine 2%, Proparacaine 0.5%.   Procedure Preparation included 5% betadine to ocular surface, eyelid speculum. A (32g) needle was used.   Injection: 2 mg aflibercept 2 MG/0.05ML   Route: Intravitreal, Site: Left Eye   NDC: M7179715, Lot: [id number], Expiration date: 05/26/2021, Waste: 0.05 mL   Post-op Post injection exam found visual  acuity of at least counting fingers. The patient tolerated the procedure well. There were no complications. The patient received written and verbal post procedure care education. Post injection medications were not given.            ASSESSMENT/PLAN:   ICD-10-CM   1. Proliferative diabetic retinopathy of both eyes with macular edema associated with type 2 diabetes mellitus (HCC)  U72.5366 Intravitreal Injection, Pharmacologic Agent - OD - Right Eye    Intravitreal Injection, Pharmacologic Agent - OS - Left Eye    aflibercept (EYLEA) SOLN 2 mg    aflibercept (EYLEA) SOLN 2 mg    2. Retinal edema  H35.81 OCT, Retina - OU - Both  Eyes    3. Vitreous hemorrhage of left eye (HCC)  H43.12     4. Right endophthalmia  H44.001     5. Vitreous hemorrhage, right eye ([place])  H43.11     6. Essential hypertension  I10     7. Hypertensive retinopathy of both eyes  H35.033     8. Combined forms of age-related cataract of left eye  H25.812     9. Pseudophakia of right eye  Z96.1     10. Combined forms of age-related cataract of both eyes  H25.813     11. Vitreous hemorrhage of right eye (HCC)  H43.11      1,2.  Proliferative diabetic retinopathy w/ DME, OU  - HbA1c 7.8% (06.13.22), 9.1% (02.24.22) 11.0% (11.22.21)  - s/p IVA OD #1 9.20.19, #2 (10.25.19), #3 (11.15.19), #4 (12.17.19), #5 (01.14.20), #6 (2.11.20), #7 (05.29.20), #8 (08.19.20), #9 (10.30.20), #10 (12.09.20), #11 (01.11.21), #12 (02.15.21), #13 (03.23.21), #14 (04.20.21), #15 (06.08.21), #16 (07.06.21) -- IVA resistance  - s/p IVA OS #1 9.27.19, #2 (10.25.19), #3 (11.15.19), #4 (12.17.19), #5 (01.14.20), #6 (2.11.20), #7 (04.26.20), #8 (05.29.20), #9 (06.26.20), #10 (08.05.20), #11 (11.11.20), #12 (12.09.20), #13 (01.11.21), #14 (02.15.21), #15 (03.23.21), #16 (04.20.21), #17 (06.08.21) -- IVA resistance, #18 (09.27.21)  - s/p IVE OD #1 (08.03.21) -- sample, #2 (09.10.21), #3 (10.08.21), #4 (11.23.21), #5 (12.21.21), #6 (01.19.22) sample, #7 (2.16.22), #8 (3.22.22), #9 (04.19.22), #10 (05.27.22), #11 (06.29.22), #12 (08.03.22), #13 (09.07.22)  - s/p IVE OS #1 (10.25.21), #2 (11.23.21), #3 (12.21.21), #4 (3.22.22), #5 (06.29.22), #6 (08.03.22), #7 (09.07.22)  - S/P PRP OS (09.20.19), (5.19.20), (08.19.20), (04.28.21), (02.02.22)  - S/P PRP OD (9.27.19 and 11.21.19), fill-in (04.14.20) (09.03.20, surgery)  - S/P focal laser OS (07.06.21), OD (09.20.22)  - FA (9.20.19) shows +NVE OU and leaking MA and capillary nonperfusion  - repeat FA 11.15.19 shows NV regressing OU  - pre-op: OD w/ VA stable at 20/25, but there is some preretinal fibrosis / tractional membranes  just superior to disc and mild central DME  - s/p 25g PPV+MP+10% C3F8 gas OD (09.03.20) -- ERM/PRF removal OD  - BCVA stable at 20/25 OU             - fibrosis/ERM stably improved; retina attached  - OCT shows OD: Interval increase in IRF temporal macula; OS: stable improvement in IRF centrally, just trace non-central cystic changes temporal macula visible on widefield  - notably, IRF OD increased despite focal laser -- pt reports recent increase in BG due to taking 92m po prednisone for PMR  - recommend IVE OU (OD #14 and OS #8) today, 10.10.22  - pt wishes to proceed with injection  - RBA of procedure discussed, questions answered - see procedure note  - Avast[place] informed consent form signed and scanned on 01.11.2021 (  OU)  - Eylea informed consent form signed and scanned on 08.03.2021  - [email or web address] benefits investigation started, 08.03.21 -- approved as of 09.10.21 -- 2022 insurance verified as of 02.16.22  - f/u 5 weeks, DFE, OCT, possible injection  3. Vitreous hemorrhage OS -- recurrent, improved  - recurrent VH, onset 09.22.21  - etiology: secondary to PDR as described above (no RT/RD on exam)  - s/p PRP OS (9.20.19), (05.19.20), (08.19.20), (04.28.21), (02.02.22)  - s/p IVA OS on 4.26.20, 5.29.20, 6.26.20, 8.5.20,11.11.20, 12.06.20 and so on as above  - VH clear centrally and settled inferiorly -- now white  - BCVA stable at 20/25  - f/u 5 weeks DFE, OCT, possible injection  4. History of Endophthalmitis OD  - s/p IVA OU 04/09/2018  - s/p 25g PPV w/ intravitreal vanc, ceftaz and cefepime OD, 2.14.2020  - s/p intravitreal tap / vanc and ceflaz injections (02.16.20)             - gram stain (2.14.20) shows G+ cocci, WBCs mostly PMNs;   - repeat gram stain from t/i (2.16.20) -- no organisms, just WBCs             - cultures from vitreous grew rare Staph warneri; cultures from t/i -- no growth             - doing well, BCVA 20/25             - inflammation/posterior debris resolved              - IOP 10 off Brimonidine  - completed po pred taper -- caused significant elevations in BG  - monitor  5. History of Vitreous Hemorrhage OD -- cleared from PPV x2 for endophthalmitis and ERM/preretinal fibrosis  - secondary to PDR as described below  - S/P IVA OD #1 (09.20.19), #2 (10.25.19), #3 (11.15.19), #4 (12.16.19), #5 (03/12/2018)  - S/P PRP OD #1 (09.27.19), fill in OD (11.21.19) -- each somewhat limited inferiorly by residual VH  6,7. Hypertensive retinopathy OU  - BP elevated today (9.27.21) -- 164/91, left arm  - discussed importance of tight BP control.  - monitor  8. Combined form age related cataract OS-   - The symptoms of cataract, surgical options, and treatments and risks were discussed with patient.  - discussed diagnosis and progression  9. Pseudophakia OD  - s/p CE/IOL OD (Dr. Zenia Resides, 12.11.20)  - beautiful surgery, doing well  Ophthalmic Meds Ordered this visit:  Meds ordered this encounter  Medications   aflibercept (EYLEA) SOLN 2 mg   aflibercept (EYLEA) SOLN 2 mg      Return in about 5 weeks (around 01/10/2021) for f/u PDR OU, DFE, OCT.  There are no Patient Instructions on file for this visit.  This document serves as a record of services personally performed by Gardiner Sleeper, MD, PhD. It was created on their behalf by Leeann Must, [place], an ophthalmic technician. The creation of this record is the provider's dictation and/or activities during the visit.    Electronically signed by: Leeann Must, COA _0 @ 10:43 PM  This document serves as a record of services personally performed by Gardiner Sleeper, MD, PhD. It was created on their behalf by San Jetty. Owens Shark, OA an ophthalmic technician. The creation of this record is the provider's dictation and/or activities during the visit.    Electronically signed by: San Jetty. [place], [place] 10.10.2022 10:43 PM  Gardiner Sleeper, M.D., Ph.D. Diseases & Surgery of the Retina  and Vitreous [facility] 12/06/2020   I have reviewed the above documentation for accuracy and completeness, and I agree with the above. Gardiner Sleeper, M.D., Ph.D. 12/07/20 10:43 PM  Abbreviations: M myopia (nearsighted); A astigmatism; H hyperopia (farsighted); P presbyopia; Mrx spectacle prescription;  CTL contact lenses; OD right eye; OS left eye; OU both eyes  XT exotropia; ET esotropia; PEK punctate epithelial keratitis; PEE punctate epithelial erosions; DES dry eye syndrome; MGD meibomian gland dysfunction; ATs artificial tears; PFAT's preservative free artificial tears; [place] nuclear sclerotic cataract; PSC posterior subcapsular cataract; ERM epi-retinal membrane; PVD posterior vitreous detachment; RD retinal detachment; DM diabetes mellitus; DR diabetic retinopathy; NPDR non-proliferative diabetic retinopathy; PDR proliferative diabetic retinopathy; CSME clinically significant macular edema; DME diabetic macular edema; dbh dot blot hemorrhages; CWS cotton wool spot; POAG primary open angle glaucoma; C/D cup-to-disc ratio; HVF humphrey visual field; GVF goldmann visual field; OCT optical coherence tomography; IOP intraocular pressure; BRVO Branch retinal vein occlusion; CRVO central retinal vein occlusion; CRAO central retinal artery occlusion; BRAO branch retinal artery occlusion; RT retinal tear; SB scleral buckle; PPV pars plana vitrectomy; VH Vitreous hemorrhage; PRP panretinal laser photocoagulation; IVK intravitreal kenalog; VMT vitreomacular traction; MH Macular hole;  NVD neovascularization of the disc; NVE neovascularization elsewhere; AREDS age related eye disease study; ARMD age related macular degeneration; POAG primary open angle glaucoma; EBMD epithelial/anterior basement membrane dystrophy; ACIOL anterior chamber intraocular lens; IOL intraocular lens; PCIOL posterior chamber intraocular lens; Phaco/IOL phacoemulsification with intraocular lens placement; [place] photorefractive  keratectomy; LASIK laser assisted in situ keratomileusis; HTN hypertension; DM diabetes mellitus; COPD chronic obstructive pulmonary disease

## 2020-12-06 ENCOUNTER — Other Ambulatory Visit: Payer: Self-pay

## 2020-12-06 ENCOUNTER — Encounter (INDEPENDENT_AMBULATORY_CARE_PROVIDER_SITE_OTHER): Payer: Self-pay | Admitting: Ophthalmology

## 2020-12-06 ENCOUNTER — Ambulatory Visit (INDEPENDENT_AMBULATORY_CARE_PROVIDER_SITE_OTHER): Payer: 59 | Admitting: Ophthalmology

## 2020-12-06 DIAGNOSIS — H25812 Combined forms of age-related cataract, left eye: Secondary | ICD-10-CM

## 2020-12-06 DIAGNOSIS — H35033 Hypertensive retinopathy, bilateral: Secondary | ICD-10-CM | POA: Diagnosis not present

## 2020-12-06 DIAGNOSIS — H25813 Combined forms of age-related cataract, bilateral: Secondary | ICD-10-CM

## 2020-12-06 DIAGNOSIS — Z961 Presence of intraocular lens: Secondary | ICD-10-CM

## 2020-12-06 DIAGNOSIS — H44001 Unspecified purulent endophthalmitis, right eye: Secondary | ICD-10-CM | POA: Diagnosis not present

## 2020-12-06 DIAGNOSIS — H4313 Vitreous hemorrhage, bilateral: Secondary | ICD-10-CM | POA: Diagnosis not present

## 2020-12-06 DIAGNOSIS — I10 Essential (primary) hypertension: Secondary | ICD-10-CM

## 2020-12-06 DIAGNOSIS — H4312 Vitreous hemorrhage, left eye: Secondary | ICD-10-CM

## 2020-12-06 DIAGNOSIS — H4311 Vitreous hemorrhage, right eye: Secondary | ICD-10-CM

## 2020-12-06 DIAGNOSIS — E113513 Type 2 diabetes mellitus with proliferative diabetic retinopathy with macular edema, bilateral: Secondary | ICD-10-CM

## 2020-12-06 DIAGNOSIS — H3581 Retinal edema: Secondary | ICD-10-CM

## 2020-12-07 ENCOUNTER — Encounter (INDEPENDENT_AMBULATORY_CARE_PROVIDER_SITE_OTHER): Payer: 59 | Admitting: Ophthalmology

## 2020-12-07 ENCOUNTER — Encounter (INDEPENDENT_AMBULATORY_CARE_PROVIDER_SITE_OTHER): Payer: Self-pay | Admitting: Ophthalmology

## 2020-12-07 MED ORDER — AFLIBERCEPT 2MG/0.05ML IZ SOLN FOR KALEIDOSCOPE
2.0000 mg | INTRAVITREAL | Status: AC | PRN
  Administered 2020-12-06: 2 mg via INTRAVITREAL

## 2020-12-08 ENCOUNTER — Other Ambulatory Visit: Payer: Self-pay

## 2020-12-08 ENCOUNTER — Other Ambulatory Visit: Payer: Self-pay | Admitting: Family Medicine

## 2020-12-09 ENCOUNTER — Encounter: Payer: Self-pay | Admitting: Family Medicine

## 2020-12-09 ENCOUNTER — Ambulatory Visit (INDEPENDENT_AMBULATORY_CARE_PROVIDER_SITE_OTHER): Payer: 59 | Admitting: Family Medicine

## 2020-12-09 VITALS — BP 112/76 | HR 88 | Temp 98.0°F | Wt 195.0 lb

## 2020-12-09 DIAGNOSIS — R700 Elevated erythrocyte sedimentation rate: Secondary | ICD-10-CM | POA: Diagnosis not present

## 2020-12-09 DIAGNOSIS — R140 Abdominal distension (gaseous): Secondary | ICD-10-CM

## 2020-12-09 DIAGNOSIS — M353 Polymyalgia rheumatica: Secondary | ICD-10-CM

## 2020-12-09 DIAGNOSIS — E118 Type 2 diabetes mellitus with unspecified complications: Secondary | ICD-10-CM | POA: Diagnosis not present

## 2020-12-09 MED ORDER — LANTUS SOLOSTAR 100 UNIT/ML ~~LOC~~ SOPN: PEN_INJECTOR | SUBCUTANEOUS | 0 refills | Status: DC

## 2020-12-09 MED ORDER — PREDNISONE 5 MG PO TABS: ORAL_TABLET | ORAL | 0 refills | Status: DC

## 2020-12-09 NOTE — Telephone Encounter (Signed)
Pt has scheduled appt with provider this afternoon

## 2020-12-09 NOTE — Progress Notes (Signed)
OFFICE VISIT  12/09/2020  CC:  Chief Complaint  Patient presents with   Follow-up    4 week. Fibro, pt not fasting   HPI:    Patient is a 50 y.o. female who presents for 1 mo f/u musculoskeletal pain, DM, chronic bloating, and hx of IDA. A/P as of last visit: "1) Widespread musculoskeletal pain, fatigue-->chronic.  Suspect fibromyalgia syndrome. Will check ESR to r/o PMR. Low susp inflammatory arthritis. Start trial of gabapentin: 300 qhs and titrate up by 1 pill every 5d until taking 300 tid.   2) HTN: well controlled on losartan 100 qd, amlod 5 qd, toprol xl 200 qd, and calan SR 240 qd. Lytes/cr today.   3) Chronic bloating, worse postprandially. Unclear if response to reglan-->she'll try ween off and see if sx's happen to get worse. Abd u/s has been unremarkable. Will ref to GI to see if further eval recommended, possible trial of diff med, etc.   4) DM 2, not well controlled.  Referred to endo but pt got no call to set this up. Will try referring to diff endo (Dr. Kelton Pillar with [facility]). Hba1c today. She has hx of albuminuria and is on max med tx for this (losartan and verapamil).   5) HLD: tolerating rosuvastatin 10mg  qd. LDL goal <70.  Her LDL was 58 about 7 mo ago. Pt not fasting today.  Plan rpt lipids future o/v when fasting.   6) Preventative health care:  Flu and prevnar 20 vaccines given today."  INTERIM HX: Feeling pretty well. Labs last visit showed elev ESR (70) -->working dx polymyalgia rheumatica.  Started her on prednisone 20mg  qd. Remainder of labs normal except stable mild AST/ALT elev->72 and 82 respectively. Also, Hba1c was 8.6%.  She says her pain significantly improved after getting on the prednisone I prescribed.  Mainly improved in hips and shoulders.  Has been able to be quite a bit more active as a result.  Unfortunately her glucoses have significantly elevated on the prednisone as we had worried about.  She has had to increase her  Lantus to 90 to 100 units daily. Additionally she reports today that her eye doctor is not concerned that the prednisone is making her eye problems worse.  He mentioned it to her to ask me if there was a plan B for treatment of her PMR.  Gabapentin at 300 mg 3 times a day has also significantly helped with her chronic pain.  No side effects.  Last visit 1 month ago I ordered new referral to endocrinology as well as GI and nothing has been scheduled.  Chronic feeling of bloating has not changed.  Past Medical History:  Diagnosis Date   Abdominal bloating    likely from diab gastroparesis.  Dr. Havery Moros started trial of reglan 03/2019.   Benign brain tumor ([place])    Cystic lesion in cerebral aqueduct region with mild hydrocephalus-- stable MRI 02/2016.  Surveillance MRI 05/2017 --dilated cerebral aqueduct related to aqueductal stenosis and subsequent mild hydrocephalus (due to the 11 mm stable cystic lesion in cerebral aqueduct---?congenitial?.   Cataract    OU   Dysmenorrhea    vicodin occ during first 2 days of cycle.   Gluten intolerance    pt reports she underwent full GI w/u to r/o celiac dz   Hepatic steatosis    ultrasound 08/2017. Hx of very mild elevation of ALT.  Stable on u/s 06/2020   History of adenomatous polyp of colon 04/08/2019   recall Feb 2024  Hyperlipidemia, mixed    Hypertension    +white coat component   Hypertensive retinopathy of both eyes    Insomnia    Iron deficiency 01/2019   Hb 11.3. Hemoccults neg x 3 03/19/19. EGD and colonoscopy 04/08/19 showed NO cause for IDA.  Pt does have menorrhagia, though, so she'll see her GYN.  started FeSO4 325 qd approx 04/14/19.   Menorrhagia    resulting in IDA 2021   Proliferative diabetic retinopathy of both eyes ([place])    steroid injections 10/2017--improved   Sensorineural hearing loss of left ear    Sudden left hearing loss summer 2016--no improvement with steroids 01/2015 so brain MRI done by Dr. Redmond Baseman and it showed brain  tumor that was determined to be benign.  Pt's hearing not bad enough for hearing aid as of 06/2016.   Type 2 diabetes with complication (H[facility])    +microalbuminuria, diab retpthy, diabetic gastroparesis (gastric emptying study mildly abnl 03/2017).  Recommended lantus 08/2018 but pt declined. Mild microalbuminuria.   Uterine fibroid 2022   per pt report, pelvic u/s in GYN office    Past Surgical History:  Procedure Laterality Date   ANOSCOPY  05/12/2019   Procedure: normal exam, minimal hemorrhoid disease. Hyertrophied anal papila, benign appearing, posterior midline. Surgeon: Leighton Ruff MD   CHOLECYSTECTOMY  2000   COLONOSCOPY  04/08/2019   5 adenomas, recall 3 yrs; no cause for IDA found.  Hypertrophied anal papillae->bx showed low grade dysplasia; GI referred her to colorectal surgeon.   ESOPHAGOGASTRODUODENOSCOPY  04/08/2019   mild chronic reactive gastritis. H pylori NEG.  No cause for IDA found.   GAS INSERTION Right 10/31/2018   Procedure: Insertion Of C3F8 Gas;  Surgeon: Bernarda Caffey, MD;  Location: [facility];  Service: Ophthalmology;  Laterality: Right;   GASTRIC EMPTYING SCAN  04/20/2017   Mildly abnormal, particularly the 1st hour of emptying.   MEMBRANE PEEL Right 10/31/2018   Procedure: MEMBRANE PEEL;  Surgeon: Bernarda Caffey, MD;  Location: [facility];  Service: Ophthalmology;  Laterality: Right;   PARS PLANA VITRECTOMY Right 04/12/2018   Procedure: Right PARS PLANA VITRECTOMY WITH 25 GAUGE with intravitreal antibiotics;  Surgeon: Bernarda Caffey, MD;  Location: [facility];  Service: Ophthalmology;  Laterality: Right;   PARS PLANA VITRECTOMY Right 10/31/2018   Procedure: PARS PLANA VITRECTOMY WITH 25 GAUGE;  Surgeon: Bernarda Caffey, MD;  Location: [place];  Service: Ophthalmology;  Laterality: Right;   PHOTOCOAGULATION WITH LASER Right 10/31/2018   Procedure: Photocoagulation With Laser;  Surgeon: Bernarda Caffey, MD;  Location: [facility];  Service: Ophthalmology;  Laterality: Right;   TRANSTHORACIC  ECHOCARDIOGRAM  08/23/2020   Grd I DD, o/w normal.    Outpatient Medications Prior to Visit  Medication Sig Dispense Refill   amLODipine (NORVASC) 5 MG tablet Take 1 tablet (5 mg total) by mouth daily. 90 tablet 3   gabapentin (NEURONTIN) 300 MG capsule Take 1 capsule (300 mg total) by mouth 3 (three) times daily. 90 capsule 3   HYDROcodone-acetaminophen (NORCO/VICODIN) 5-325 MG tablet Take 1-2 tablets by mouth every 6 (six) hours as needed for moderate pain. 40 tablet 0   losartan (COZAAR) 100 MG tablet Take 1 tablet by mouth once daily 90 tablet 0   metFORMIN (GLUCOPHAGE) 1000 MG tablet Take 1 tablet (1,000 mg total) by mouth 2 (two) times daily with a meal. 180 tablet 3   metoCLOPramide (REGLAN) 5 MG tablet TAKE 1 TABLET BY MOUTH 4 TIMES DAILY BEFORE MEAL(S) AND AT BEDTIME 360 tablet 3  metoprolol succinate (TOPROL-XL) 100 MG 24 hr tablet Take 2 tablets by mouth once daily 180 tablet 3   Multiple Vitamin (MULTIVITAMIN WITH MINERALS) TABS tablet Take 1 tablet by mouth daily.     OVER THE COUNTER MEDICATION Take 2 capsules by mouth daily. Neu Remedy     pantoprazole (PROTONIX) 40 MG tablet TAKE 1 TABLET BY MOUTH ONCE DAILY . 90 tablet 3   promethazine (PHENERGAN) 12.5 MG tablet 1-2 tabs po q6h prn nausea 30 tablet 1   rosuvastatin (CRESTOR) 10 MG tablet Take 1 tablet (10 mg total) by mouth daily. 90 tablet 3   verapamil (CALAN-SR) 240 MG CR tablet Take 1 tablet (240 mg total) by mouth at bedtime. 30 tablet 6   VICTOZA 18 MG/3ML SOPN INJECT 1.$RemoveBefor'2MG'KsvIoJbbhLOU$  INTO THE SKIN DAILY 18 mL 0   furosemide (LASIX) 20 MG tablet TAKE 2 TABLETS BY MOUTH IN THE MORNING AND 1 IN THE EVENING 90 tablet 2   LANTUS SOLOSTAR 100 UNIT/ML Solostar Pen INJECT 65-70 UNITS SUBCUTANEOUSLY AT BEDTIME GRADUALLY  TITRATING  TO  THIS  DOSE  TO  GET  TARGET  FASTING  GLUCOSE  RANGE  OF  100-110 15 mL 0   predniSONE (DELTASONE) 20 MG tablet Take 1 tablet (20 mg total) by mouth daily with breakfast. 30 tablet 0   No  facility-administered medications prior to visit.    Allergies  Allergen Reactions   Gluten Meal Swelling   Lisinopril Cough   Pioglitazone Other (See Comments)    ELEVATED glucoses + worse chronic nausea    ROS As per HPI  PE: Vitals with BMI 12/09/2020 11/10/2020 08/09/2020  Height - $Remove'5\' 3"'SxdXYoL$  $RemoveB'5\' 3"'IDGOxDio$   Weight 195 lbs 198 lbs 13 oz 190 lbs 13 oz  BMI 34.55 29.79 89.21  Systolic 194 174 081  Diastolic 76 78 65  Pulse 88 87 86   Gen: Alert, well appearing.  Patient is oriented to person, place, time, and situation. AFFECT: pleasant, lucid thought and speech. CV: RRR, no m/r/g.   LUNGS: CTA bilat, nonlabored resps, good aeration in all lung fields. EXT: no clubbing or cyanosis.  2+ pitting  edema both legs from mid tibia down.   LABS:  Lab Results  Component Value Date   ESRSEDRATE 70 (H) 11/10/2020     Chemistry      Component Value Date/Time   NA 140 11/10/2020 1338   K 4.0 11/10/2020 1338   CL 102 11/10/2020 1338   CO2 24 11/10/2020 1338   BUN 15 11/10/2020 1338   CREATININE 0.74 11/10/2020 1338   CREATININE 0.70 07/12/2020 1404      Component Value Date/Time   CALCIUM 9.6 11/10/2020 1338   ALKPHOS 83 11/10/2020 1338   AST 72 (H) 11/10/2020 1338   ALT 82 (H) 11/10/2020 1338   BILITOT 0.2 11/10/2020 1338     Lab Results  Component Value Date   WBC 11.3 (H) 07/12/2020   HGB 11.7 07/12/2020   HCT 35.7 07/12/2020   MCV 83.8 07/12/2020   PLT 473 (H) 07/12/2020   Lab Results  Component Value Date   IRON 44 (L) 07/12/2020   TIBC 447 07/12/2020   FERRITIN 26 07/12/2020   Lab Results  Component Value Date   HGBA1C 8.6 (H) 11/10/2020   Lab Results  Component Value Date   LIPASE 54.0 03/15/2017    IMPRESSION AND PLAN:  #1: Widespread musculoskeletal pain.  I do think she has polymyalgia rheumatica and likely also has fibromyalgia syndrome.  Improved on prednisone but we have to taper her off this due to the complications it is causing for her eyes.  We will  plan prednisone 10 mg a day for 7 days, then 5 mg a day for 7 days, then stop. Will refer to rheumatology for confirmation of PMR diagnosis and consideration of treatments alternative to prednisone for her.  Rechecking sed rate today.  2.  Chronic bloating.  She has a known diagnosis of diabetic gastroparesis but it does not seem to have responded to use of Reglan.  Lab and imaging work-up unrevealing.  We will check celiac labs today.  We are following up to see if we can expedite her GI referral that we ordered 1 month ago.  #3.  Diabetes type 2 with multiple complications.  Most recent A1c was 8.6% one month ago.  She has been taking Victoza and metformin as well as Lantus at ever-increasing doses.  Insurance/financial constraints have prevented use of SGLT2 inhibitors.  Avoiding Actos due to her fluid retention/bloating. We have tried to refer her to endocrinology for the last couple of months but are not getting anywhere.  New referral to Dr. Buddy Duty at [facility] ordered today.  An After Visit Summary was printed and given to the patient.  FOLLOW UP: Return in about 3 months (around 03/11/2021).  Signed:  Crissie Sickles, MD           12/09/2020

## 2020-12-10 ENCOUNTER — Other Ambulatory Visit: Payer: 59

## 2020-12-10 DIAGNOSIS — R140 Abdominal distension (gaseous): Secondary | ICD-10-CM

## 2020-12-10 LAB — TISSUE TRANSGLUTAMINASE, IGA: (tTG) Ab, IgA: <1 U/mL

## 2020-12-10 LAB — SEDIMENTATION RATE: Sed Rate: 57 mm/hr — ABNORMAL HIGH (ref 0–30)

## 2020-12-10 NOTE — Addendum Note (Signed)
Addended by: Octavio Manns E on: 12/10/2020 03:43 PM   Modules accepted: Orders

## 2020-12-13 NOTE — Progress Notes (Signed)
Labs done last week were normal.

## 2020-12-15 LAB — IGA: Immunoglobulin A: 138 mg/dL (ref 47–310)

## 2020-12-23 ENCOUNTER — Other Ambulatory Visit: Payer: Self-pay | Admitting: Family Medicine

## 2020-12-23 ENCOUNTER — Telehealth: Payer: Self-pay

## 2020-12-23 DIAGNOSIS — E118 Type 2 diabetes mellitus with unspecified complications: Secondary | ICD-10-CM

## 2020-12-23 DIAGNOSIS — M353 Polymyalgia rheumatica: Secondary | ICD-10-CM

## 2020-12-23 DIAGNOSIS — R700 Elevated erythrocyte sedimentation rate: Secondary | ICD-10-CM

## 2020-12-23 NOTE — Telephone Encounter (Signed)
Pt is need of new referral for rheumatology sine [facility] not accepting new pts

## 2020-12-23 NOTE — Telephone Encounter (Signed)
OK. New referral ordered (Dr. Lenna Gilford or Dr. Amil Amen).

## 2020-12-23 NOTE — Telephone Encounter (Signed)
Patient returned call and given update information.

## 2020-12-23 NOTE — Telephone Encounter (Signed)
New referral entered. LM for pt to provide update, contact number [phone]

## 2020-12-23 NOTE — Telephone Encounter (Signed)
Pt called stating that [facility] is not taking new pts. Referral was placed on 12/09/20.   Please advise of new referral/location.

## 2020-12-23 NOTE — Telephone Encounter (Signed)
Pls enter new endocrinology referral to Dr. Steffanie Dunn [facility] I believe)

## 2020-12-24 NOTE — Telephone Encounter (Signed)
Nothing further needed 

## 2020-12-24 NOTE — Telephone Encounter (Signed)
LM for pt regarding referral update, contact info listed below  [address] [place], [place] [address] [phone]

## 2020-12-24 NOTE — Telephone Encounter (Signed)
Pt called and given information below.

## 2020-12-28 ENCOUNTER — Other Ambulatory Visit: Payer: Self-pay | Admitting: Family Medicine

## 2021-01-06 NOTE — Progress Notes (Signed)
[facility] Note  01/10/2021     CHIEF COMPLAINT Patient presents for Retina Follow Up   HISTORY OF PRESENT ILLNESS: Gloria Gomez is a 50 y.o. female who presents to the clinic today for:  HPI     Retina Follow Up   Patient presents with  Diabetic Retinopathy.  In both eyes.  This started years ago.  Severity is moderate.  Duration of 5 weeks.  Since onset it is stable.  I, the attending physician,  performed the HPI with the patient and updated documentation appropriately.        Comments   50 y/o female pt here for 5 wk f/u for PDR w/DME OU.  No change in [place] OU noticed.  Denies pain, FOL, floaters.  No gtts.  BS 110 this a.m.  A1C 8.6.      Last edited by Bernarda Caffey, MD on 01/10/2021 11:50 PM.     Pt has a new dx of PMR per her PCP, she states he put her on Prednisone for 3 weeks, but it was making her blood sugar too high, so she called him and told him that she could not take it anymore, he has referred her to a rheumatologist, but cannot get in until February, pt saw an endocrinologist last week and she decreased her insulin by 30 units and gave her a Libre device to check her blood sugar, she states her numbers have been very good, she goes back to see her in a couple weeks and may change her Victoza to [place]    Referring physician:   HISTORICAL INFORMATION:   Selected notes from the MEDICAL RECORD NUMBER Referred for DM exam   CURRENT MEDICATIONS: No current outpatient medications on file. (Ophthalmic Drugs)   No current facility-administered medications for this visit. (Ophthalmic Drugs)   Current Outpatient Medications (Other)  Medication Sig   amLODipine (NORVASC) 5 MG tablet Take 1 tablet (5 mg total) by mouth daily.   furosemide (LASIX) 20 MG tablet TAKE 2 TABLETS BY MOUTH IN THE MORNING AND 1 IN THE EVENING   gabapentin (NEURONTIN) 300 MG capsule Take 1 capsule (300 mg total) by mouth 3 (three) times daily.    HYDROcodone-acetaminophen (NORCO/VICODIN) 5-325 MG tablet Take 1-2 tablets by mouth every 6 (six) hours as needed for moderate pain.   LANTUS SOLOSTAR 100 UNIT/ML Solostar Pen INJECT 90 TO 100 UNITS SUBCUTANEOUSLY AT BEDTIME GRADUALLY TITRATING TO THIS DOSE TO GET [employer] FASTING GLUCOSE RANGE OF 100-110   losartan (COZAAR) 100 MG tablet Take 1 tablet by mouth once daily   metFORMIN (GLUCOPHAGE) 1000 MG tablet Take 1 tablet (1,000 mg total) by mouth 2 (two) times daily with a meal.   metoCLOPramide (REGLAN) 5 MG tablet TAKE 1 TABLET BY MOUTH 4 TIMES DAILY BEFORE MEAL(S) AND AT BEDTIME   metoprolol succinate (TOPROL-XL) 100 MG 24 hr tablet Take 2 tablets by mouth once daily   Multiple Vitamin (MULTIVITAMIN WITH MINERALS) TABS tablet Take 1 tablet by mouth daily.   OVER THE COUNTER MEDICATION Take 2 capsules by mouth daily. Neu Remedy   pantoprazole (PROTONIX) 40 MG tablet TAKE 1 TABLET BY MOUTH ONCE DAILY .   predniSONE (DELTASONE) 5 MG tablet 2 tabs po qd x 7d, then 1 tab po qd x 7d   promethazine (PHENERGAN) 12.5 MG tablet 1-2 tabs po q6h prn nausea   rosuvastatin (CRESTOR) 10 MG tablet Take 1 tablet (10 mg total) by mouth daily.   verapamil (  CALAN-SR) 240 MG CR tablet Take 1 tablet (240 mg total) by mouth at bedtime.   VICTOZA 18 MG/3ML SOPN INJECT 1.2MG INTO THE SKIN DAILY   No current facility-administered medications for this visit. (Other)   REVIEW OF SYSTEMS: ROS   Positive for: Endocrine, Eyes Negative for: Constitutional, Gastrointestinal, Neurological, Skin, Genitourinary, Musculoskeletal, HENT, Cardiovascular, Respiratory, Psychiatric, Allergic/Imm, Heme/Lymph Last edited by Matthew Folks, COA on 01/10/2021 12:58 PM.     ALLERGIES Allergies  Allergen Reactions   Gluten Meal Swelling   Lisinopril Cough   Pioglitazone Other (See Comments)    ELEVATED glucoses + worse chronic nausea    PAST MEDICAL HISTORY Past Medical History:  Diagnosis Date   Abdominal bloating     likely from diab gastroparesis.  Dr. Havery Moros started trial of reglan 03/2019.   Benign brain tumor ([place])    Cystic lesion in cerebral aqueduct region with mild hydrocephalus-- stable MRI 02/2016.  Surveillance MRI 05/2017 --dilated cerebral aqueduct related to aqueductal stenosis and subsequent mild hydrocephalus (due to the 11 mm stable cystic lesion in cerebral aqueduct---?congenitial?.   Cataract    OU   Dysmenorrhea    vicodin occ during first 2 days of cycle.   Gluten intolerance    pt reports she underwent full GI w/u to r/o celiac dz   Hepatic steatosis    ultrasound 08/2017. Hx of very mild elevation of ALT.  Stable on u/s 06/2020   History of adenomatous polyp of colon 04/08/2019   recall Feb 2024   Hyperlipidemia, mixed    Hypertension    +white coat component   Hypertensive retinopathy of both eyes    Insomnia    Iron deficiency 01/2019   Hb 11.3. Hemoccults neg x 3 03/19/19. EGD and colonoscopy 04/08/19 showed NO cause for IDA.  Pt does have menorrhagia, though, so she'll see her GYN.  started FeSO4 325 qd approx 04/14/19.   Menorrhagia    resulting in IDA 2021   Proliferative diabetic retinopathy of both eyes ([place])    steroid injections 10/2017--improved   Sensorineural hearing loss of left ear    Sudden left hearing loss summer 2016--no improvement with steroids 01/2015 so brain MRI done by Dr. Redmond Baseman and it showed brain tumor that was determined to be benign.  Pt's hearing not bad enough for hearing aid as of 06/2016.   Type 2 diabetes with complication (HCC)    +microalbuminuria, diab retpthy, diabetic gastroparesis (gastric emptying study mildly abnl 03/2017).  Recommended lantus 08/2018 but pt declined. Mild microalbuminuria.   Uterine fibroid 2022   per pt report, pelvic u/s in GYN office   Past Surgical History:  Procedure Laterality Date   ANOSCOPY  05/12/2019   Procedure: normal exam, minimal hemorrhoid disease. Hyertrophied anal papila, benign appearing, posterior  midline. Surgeon: Leighton Ruff MD   CHOLECYSTECTOMY  2000   COLONOSCOPY  04/08/2019   5 adenomas, recall 3 yrs; no cause for IDA found.  Hypertrophied anal papillae->bx showed low grade dysplasia; GI referred her to colorectal surgeon.   ESOPHAGOGASTRODUODENOSCOPY  04/08/2019   mild chronic reactive gastritis. H pylori NEG.  No cause for IDA found.   GAS INSERTION Right 10/31/2018   Procedure: Insertion Of C3F8 Gas;  Surgeon: Bernarda Caffey, MD;  Location: [place] [facility];  Service: Ophthalmology;  Laterality: Right;   GASTRIC EMPTYING SCAN  04/20/2017   Mildly abnormal, particularly the 1st hour of emptying.   MEMBRANE PEEL Right 10/31/2018   Procedure: MEMBRANE PEEL;  Surgeon: Bernarda Caffey, MD;  Location: [facility];  Service: Ophthalmology;  Laterality: Right;   PARS PLANA VITRECTOMY Right 04/12/2018   Procedure: Right PARS PLANA VITRECTOMY WITH 25 GAUGE with intravitreal antibiotics;  Surgeon: Bernarda Caffey, MD;  Location: [facility];  Service: Ophthalmology;  Laterality: Right;   PARS PLANA VITRECTOMY Right 10/31/2018   Procedure: PARS PLANA VITRECTOMY WITH 25 GAUGE;  Surgeon: Bernarda Caffey, MD;  Location: [facility];  Service: Ophthalmology;  Laterality: Right;   PHOTOCOAGULATION WITH LASER Right 10/31/2018   Procedure: Photocoagulation With Laser;  Surgeon: Bernarda Caffey, MD;  Location: [facility];  Service: Ophthalmology;  Laterality: Right;   TRANSTHORACIC ECHOCARDIOGRAM  08/23/2020   Grd I DD, o/w normal.    FAMILY HISTORY Family History  Problem Relation Age of Onset   Brain cancer Mother    Diabetes Father    Diabetes Maternal Grandmother    Cataracts Maternal Grandmother    Cervical cancer Paternal Grandmother    Colon cancer Maternal Grandfather 40   Amblyopia Neg Hx    Blindness Neg Hx    Glaucoma Neg Hx    Macular degeneration Neg Hx    Retinal detachment Neg Hx    Strabismus Neg Hx    Retinitis pigmentosa Neg Hx    Esophageal cancer Neg Hx    Stomach cancer Neg Hx    Rectal cancer Neg Hx      SOCIAL HISTORY Social History   Tobacco Use   Smoking status: Never   Smokeless tobacco: Never  Vaping Use   Vaping Use: Never used  Substance Use Topics   Alcohol use: No   Drug use: No       OPHTHALMIC EXAM:  Base Eye Exam     Visual Acuity (Snellen - Linear)       Right Left   Dist [place] 20/25 -2    Dist cc  20/25   Dist ph [place] NI    Dist ph cc  NI    Correction: Glasses         Tonometry (Tonopen, 1:06 PM)       Right Left   Pressure 13 10         Pupils       Dark Light Shape React APD   Right 3 2 Round Brisk None   Left 3 2 Round Brisk None         Visual Fields (Counting fingers)       Left Right    Full Full         Extraocular Movement       Right Left    Full, Ortho Full, Ortho         Neuro/Psych     Oriented x3: Yes   Mood/Affect: Normal         Dilation     Both eyes: 1.0% Mydriacyl, 2.5% Phenylephrine @ 1:06 PM           Slit Lamp and Fundus Exam     Slit Lamp Exam       Right Left   Lids/Lashes Dermatochalasis - upper lid, mild Meibomian gland dysfunction, Telangiectasia Dermatochalasis - upper lid, Meibomian gland dysfunction, Telangiectasia   Conjunctiva/Sclera White and quiet White and quiet   Cornea Clear, well healed temporal cataract wounds Trace Punctate epithelial erosions   Anterior Chamber Deep and quiet Deep and quiet   Iris Round and dilated, No NVI Round and dilated, No NVI   Lens PC IOL in good position, 1-2+ Posterior capsular opacification, PC folds  2-3+ Nuclear sclerosis, 2-3+ Cortical cataract   Vitreous post vitrectomy, trace pigment Vitreous syneresis, blood stained vitreous condensations setteled inferiorly and white, persistent blood stained floater just IN to disc - improving--now white in color         Fundus Exam       Right Left   Disc mild Pallor, Sharp rim, temporal PPA Pink and sharp, Compact, PPA   C/D Ratio 0.2 0.0   Macula Flat, good foveal reflex, mild cystic  changes ST to fovea -- slighlty increased, persistent punctate exudate, scattered MA ST mac -- improved, focal MAs temporal macula, +focal laser changes Flat, good foveal reflex, trace cystic changes -- slightly increased, scattered MA; trace ERM, light focal laser changes   Vessels attenuated, Tortuous attenuated, mild Tortuousity   Periphery Attached, scattered DBH greatest superiorly, 360 PRP, good laser fill in 360 attached, scattered IRH; 360 PRP scars -- with good fill in changes           IMAGING AND PROCEDURES  Imaging and Procedures for _0 @  OCT, Retina - OU - Both Eyes       Right Eye Quality was good. Central Foveal Thickness: 414. Progression has worsened. Findings include abnormal foveal contour, epiretinal membrane, no SRF, intraretinal hyper-reflective material, intraretinal fluid (Interval increase in IRF central and temporal macula).   Left Eye Quality was good. Central Foveal Thickness: 275. Progression has been stable. Findings include normal foveal contour, no SRF, intraretinal hyper-reflective material, intraretinal fluid (non-central cystic changes slightly increased).   Notes *Images captured and stored on drive  Diagnosis / Impression:  DME OU OD: Interval increase in IRF central and temporal macula OS: non-central cystic changes slightly increased  Clinical management:  See below  Abbreviations: NFP - Normal foveal profile. CME - cystoid macular edema. PED - pigment epithelial detachment. IRF - intraretinal fluid. SRF - subretinal fluid. EZ - ellipsoid zone. ERM - epiretinal membrane. ORA - outer retinal atrophy. ORT - outer retinal tubulation. SRHM - subretinal hyper-reflective material       Intravitreal Injection, Pharmacologic Agent - OD - Right Eye       Time Out 01/10/2021. 1:47 PM. Confirmed correct patient, procedure, site, and patient consented.   Anesthesia Topical anesthesia was used. Anesthetic medications included Lidocaine 2%,  Proparacaine 0.5%.   Procedure Preparation included 5% betadine to ocular surface, eyelid speculum. A (32g) needle was used.   Injection: 2 mg aflibercept 2 MG/0.05ML   Route: Intravitreal, Site: Right Eye   NDC: A3590391, Lot: [id number], Expiration date: 11/26/2021, Waste: 0.05 mL   Post-op Post injection exam found visual acuity of at least counting fingers. The patient tolerated the procedure well. There were no complications. The patient received written and verbal post procedure care education. Post injection medications were not given.      Intravitreal Injection, Pharmacologic Agent - OS - Left Eye       Time Out 01/10/2021. 1:48 PM. Confirmed correct patient, procedure, site, and patient consented.   Anesthesia Topical anesthesia was used. Anesthetic medications included Lidocaine 2%, Proparacaine 0.5%.   Procedure Preparation included 5% betadine to ocular surface, eyelid speculum. A (32g) needle was used.   Injection: 2 mg aflibercept 2 MG/0.05ML   Route: Intravitreal, Site: Left Eye   NDC: M7179715, Lot: [id number], Expiration date: 04/26/2022, Waste: 0.05 mL   Post-op Post injection exam found visual acuity of at least counting fingers. The patient tolerated the procedure well. There were no complications. The patient received written and verbal post procedure  care education. Post injection medications were not given.             ASSESSMENT/PLAN:   ICD-10-CM   1. Proliferative diabetic retinopathy of both eyes with macular edema associated with type 2 diabetes mellitus (HCC)  X52.8413 Intravitreal Injection, Pharmacologic Agent - OD - Right Eye    Intravitreal Injection, Pharmacologic Agent - OS - Left Eye    aflibercept (EYLEA) SOLN 2 mg    aflibercept (EYLEA) SOLN 2 mg    2. Retinal edema  H35.81 OCT, Retina - OU - Both Eyes    3. Vitreous hemorrhage of left eye (HCC)  H43.12     4. Right endophthalmia  H44.001     5. Vitreous hemorrhage,  right eye ([place])  H43.11     6. Essential hypertension  I10     7. Hypertensive retinopathy of both eyes  H35.033     8. Combined forms of age-related cataract of left eye  H25.812     9. Pseudophakia of right eye  Z96.1       1,2.  Proliferative diabetic retinopathy w/ DME, OU  - HbA1c 7.8% (06.13.22), 9.1% (02.24.22) 11.0% (11.22.21)  - s/p IVA OD #1 9.20.19, #2 (10.25.19), #3 (11.15.19), #4 (12.17.19), #5 (01.14.20), #6 (2.11.20), #7 (05.29.20), #8 (08.19.20), #9 (10.30.20), #10 (12.09.20), #11 (01.11.21), #12 (02.15.21), #13 (03.23.21), #14 (04.20.21), #15 (06.08.21), #16 (07.06.21) -- IVA resistance  - s/p IVA OS #1 9.27.19, #2 (10.25.19), #3 (11.15.19), #4 (12.17.19), #5 (01.14.20), #6 (2.11.20), #7 (04.26.20), #8 (05.29.20), #9 (06.26.20), #10 (08.05.20), #11 (11.11.20), #12 (12.09.20), #13 (01.11.21), #14 (02.15.21), #15 (03.23.21), #16 (04.20.21), #17 (06.08.21) -- IVA resistance, #18 (09.27.21)  - s/p IVE OD #1 (08.03.21) -- sample, #2 (09.10.21), #3 (10.08.21), #4 (11.23.21), #5 (12.21.21), #6 (01.19.22) sample, #7 (2.16.22), #8 (3.22.22), #9 (04.19.22), #10 (05.27.22), #11 (06.29.22), #12 (08.03.22), #13 (09.07.22), #14 (10.10.22)  - s/p IVE OS #1 (10.25.21), #2 (11.23.21), #3 (12.21.21), #4 (3.22.22), #5 (06.29.22), #6 (08.03.22), #7 (09.07.22), #8 (10.10.22)  - S/P PRP OS (09.20.19), (5.19.20), (08.19.20), (04.28.21), (02.02.22)  - S/P PRP OD (9.27.19 and 11.21.19), fill-in (04.14.20) (09.03.20, surgery)  - S/P focal laser OS (07.06.21), OD (09.20.22)  - FA (9.20.19) shows +NVE OU and leaking MA and capillary nonperfusion  - repeat FA 11.15.19 shows NV regressing OU  - pre-op: OD w/ VA stable at 20/25, but there is some preretinal fibrosis / tractional membranes just superior to disc and mild central DME  - s/p 25g PPV+MP+10% C3F8 gas OD (09.03.20) -- ERM/PRF removal OD  - BCVA stable at 20/25 OU             - fibrosis/ERM stably improved; retina attached  - OCT shows OD:  Interval increase in IRF central and temporal macula; OS: non-central cystic changes slightly increased  - recommend IVE OU (OD #15 and OS #9) today, 11.14.22  - pt wishes to proceed with injection  - RBA of procedure discussed, questions answered - see procedure note  - Avastin informed consent form signed and scanned on 01.11.2021 (OU)  - Eylea informed consent form signed and scanned on 08.03.2021  - [email or web address] benefits investigation started, 08.03.21 -- approved as of 09.10.21 -- 2022 insurance verified as of 02.16.22  - f/u 5 weeks, DFE, OCT, possible injection  3. Vitreous hemorrhage OS -- recurrent, improved  - recurrent VH, onset 09.22.21  - etiology: secondary to PDR as described above (no RT/RD on exam)  - s/p PRP OS (9.20.19), (05.19.20), (08.19.20), (04.28.21), (02.02.22)  -  s/p IVA OS on 4.26.20, 5.29.20, 6.26.20, 8.5.20,11.11.20, 12.06.20 and so on as above  - VH clear centrally and settled inferiorly -- now white  - BCVA stable at 20/25  - f/u 5 weeks DFE, OCT, possible injection  4. History of Endophthalmitis OD  - s/p IVA OU 04/09/2018  - s/p 25g PPV w/ intravitreal vanc, ceftaz and cefepime OD, 2.14.2020  - s/p intravitreal tap / vanc and ceflaz injections (02.16.20)             - gram stain (2.14.20) shows G+ cocci, WBCs mostly PMNs;   - repeat gram stain from t/i (2.16.20) -- no organisms, just WBCs             - cultures from vitreous grew rare Staph warneri; cultures from t/i -- no growth             - doing well, BCVA 20/25             - inflammation/posterior debris resolved             - IOP 10 off Brimonidine  - completed po pred taper -- caused significant elevations in BG  - monitor  5. History of Vitreous Hemorrhage OD -- cleared from PPV x2 for endophthalmitis and ERM/preretinal fibrosis  - secondary to PDR as described below  - S/P IVA OD #1 (09.20.19), #2 (10.25.19), #3 (11.15.19), #4 (12.16.19), #5 (03/12/2018)  - S/P PRP OD #1 (09.27.19), fill in OD  (11.21.19) -- each somewhat limited inferiorly by residual VH  6,7. Hypertensive retinopathy OU  - BP elevated today (9.27.21) -- 164/91, left arm  - discussed importance of tight BP control.  - monitor  8. Combined form age related cataract OS-   - The symptoms of cataract, surgical options, and treatments and risks were discussed with patient.  - discussed diagnosis and progression  9. Pseudophakia OD  - s/p CE/IOL OD (Dr. Zenia Resides, 12.11.20)  - beautiful surgery, doing well  Ophthalmic Meds Ordered this visit:  Meds ordered this encounter  Medications   aflibercept (EYLEA) SOLN 2 mg   aflibercept (EYLEA) SOLN 2 mg       Return in about 4 weeks (around 02/07/2021) for f/u PDR OU, DFE, OCT.  There are no Patient Instructions on file for this visit.  This document serves as a record of services personally performed by Gardiner Sleeper, MD, PhD. It was created on their behalf by Roselee Nova, COMT. The creation of this record is the provider's dictation and/or activities during the visit.  Electronically signed by: Roselee Nova, COMT 01/11/21 12:00 AM  This document serves as a record of services personally performed by Gardiner Sleeper, MD, PhD. It was created on their behalf by San Jetty. Owens Shark, OA an ophthalmic technician. The creation of this record is the provider's dictation and/or activities during the visit.    Electronically signed by: San Jetty. Owens Shark, [place] 11.14.2022 12:00 AM  Gardiner Sleeper, M.D., Ph.D. Diseases & Surgery of the Retina and Vitreous [facility]  I have reviewed the above documentation for accuracy and completeness, and I agree with the above. Gardiner Sleeper, M.D., Ph.D. 01/11/21 12:01 AM   Abbreviations: M myopia (nearsighted); A astigmatism; H hyperopia (farsighted); P presbyopia; Mrx spectacle prescription;  CTL contact lenses; OD right eye; OS left eye; OU both eyes  XT exotropia; ET esotropia; PEK punctate epithelial  keratitis; PEE punctate epithelial erosions; DES dry eye syndrome; MGD meibomian gland  dysfunction; ATs artificial tears; PFAT's preservative free artificial tears; [place] nuclear sclerotic cataract; PSC posterior subcapsular cataract; ERM epi-retinal membrane; PVD posterior vitreous detachment; RD retinal detachment; DM diabetes mellitus; DR diabetic retinopathy; NPDR non-proliferative diabetic retinopathy; PDR proliferative diabetic retinopathy; CSME clinically significant macular edema; DME diabetic macular edema; dbh dot blot hemorrhages; CWS cotton wool spot; POAG primary open angle glaucoma; C/D cup-to-disc ratio; HVF humphrey visual field; GVF goldmann visual field; OCT optical coherence tomography; IOP intraocular pressure; BRVO Branch retinal vein occlusion; CRVO central retinal vein occlusion; CRAO central retinal artery occlusion; BRAO branch retinal artery occlusion; RT retinal tear; SB scleral buckle; PPV pars plana vitrectomy; VH Vitreous hemorrhage; PRP panretinal laser photocoagulation; IVK intravitreal kenalog; VMT vitreomacular traction; MH Macular hole;  NVD neovascularization of the disc; NVE neovascularization elsewhere; AREDS age related eye disease study; ARMD age related macular degeneration; POAG primary open angle glaucoma; EBMD epithelial/anterior basement membrane dystrophy; ACIOL anterior chamber intraocular lens; IOL intraocular lens; PCIOL posterior chamber intraocular lens; Phaco/IOL phacoemulsification with intraocular lens placement; [place] photorefractive keratectomy; LASIK laser assisted in situ keratomileusis; HTN hypertension; DM diabetes mellitus; COPD chronic obstructive pulmonary disease

## 2021-01-10 ENCOUNTER — Ambulatory Visit (INDEPENDENT_AMBULATORY_CARE_PROVIDER_SITE_OTHER): Payer: 59 | Admitting: Ophthalmology

## 2021-01-10 ENCOUNTER — Encounter (INDEPENDENT_AMBULATORY_CARE_PROVIDER_SITE_OTHER): Payer: Self-pay | Admitting: Ophthalmology

## 2021-01-10 ENCOUNTER — Other Ambulatory Visit: Payer: Self-pay

## 2021-01-10 DIAGNOSIS — I10 Essential (primary) hypertension: Secondary | ICD-10-CM | POA: Diagnosis not present

## 2021-01-10 DIAGNOSIS — H25812 Combined forms of age-related cataract, left eye: Secondary | ICD-10-CM

## 2021-01-10 DIAGNOSIS — H3581 Retinal edema: Secondary | ICD-10-CM

## 2021-01-10 DIAGNOSIS — H35033 Hypertensive retinopathy, bilateral: Secondary | ICD-10-CM

## 2021-01-10 DIAGNOSIS — H4313 Vitreous hemorrhage, bilateral: Secondary | ICD-10-CM

## 2021-01-10 DIAGNOSIS — E113513 Type 2 diabetes mellitus with proliferative diabetic retinopathy with macular edema, bilateral: Secondary | ICD-10-CM

## 2021-01-10 DIAGNOSIS — Z961 Presence of intraocular lens: Secondary | ICD-10-CM

## 2021-01-10 DIAGNOSIS — H4312 Vitreous hemorrhage, left eye: Secondary | ICD-10-CM

## 2021-01-10 DIAGNOSIS — H44001 Unspecified purulent endophthalmitis, right eye: Secondary | ICD-10-CM | POA: Diagnosis not present

## 2021-01-10 DIAGNOSIS — H4311 Vitreous hemorrhage, right eye: Secondary | ICD-10-CM

## 2021-01-10 MED ORDER — AFLIBERCEPT 2MG/0.05ML IZ SOLN FOR KALEIDOSCOPE
2.0000 mg | INTRAVITREAL | Status: AC | PRN
Start: 2021-01-10 — End: 2021-01-10
  Administered 2021-01-10: 2 mg via INTRAVITREAL

## 2021-01-10 MED ORDER — AFLIBERCEPT 2MG/0.05ML IZ SOLN FOR KALEIDOSCOPE
2.0000 mg | INTRAVITREAL | Status: AC | PRN
  Administered 2021-01-10: 2 mg via INTRAVITREAL

## 2021-01-19 ENCOUNTER — Other Ambulatory Visit: Payer: Self-pay | Admitting: Family Medicine

## 2021-02-03 NOTE — Progress Notes (Signed)
[facility] Note  02/07/2021     CHIEF COMPLAINT Patient presents for Retina Follow Up   HISTORY OF PRESENT ILLNESS: Gloria Gomez is a 50 y.o. female who presents to the clinic today for:  HPI     Retina Follow Up   Patient presents with  Diabetic Retinopathy.  In both eyes.  Severity is moderate.  Duration of 4 weeks.  Since onset it is stable.  I, the attending physician,  performed the HPI with the patient and updated documentation appropriately.        Comments   Pt here for 4 wk f/u PDR OU. Pt states no vision changes or issues. Blood sugar this morning was 128. Most recent A1C level was 8.6.       Last edited by Bernarda Caffey, MD on 02/07/2021  2:18 PM.    Pt states her vision may be improving, but she still has some blurriness, she goes back to see her endocrinologist next week, her long acting insulin was reduced from 90-100 units to 30-40 units, pt will also see her PCP next week to get a referral to a new rheumotologist    Referring physician:   HISTORICAL INFORMATION:   Selected notes from the MEDICAL RECORD NUMBER Referred for DM exam   CURRENT MEDICATIONS: No current outpatient medications on file. (Ophthalmic Drugs)   No current facility-administered medications for this visit. (Ophthalmic Drugs)   Current Outpatient Medications (Other)  Medication Sig   amLODipine (NORVASC) 5 MG tablet Take 1 tablet (5 mg total) by mouth daily.   furosemide (LASIX) 20 MG tablet TAKE 2 TABLETS BY MOUTH IN THE MORNING AND 1 IN THE EVENING   gabapentin (NEURONTIN) 300 MG capsule Take 1 capsule (300 mg total) by mouth 3 (three) times daily.   LANTUS SOLOSTAR 100 UNIT/ML Solostar Pen INJECT 90 TO 100 UNITS SUBCUTANEOUSLY AT BEDTIME GRADUALLY TITRATING TO THIS DOSE TO GET TARGET FASTING GLUCOSE RANGE OF 100-110 (Patient taking differently: 30-40 Units. INJECT 90 TO 100 UNITS SUBCUTANEOUSLY AT BEDTIME GRADUALLY TITRATING TO THIS DOSE TO GET  TARGET FASTING GLUCOSE RANGE OF 100-110)   losartan (COZAAR) 100 MG tablet Take 1 tablet by mouth once daily   metFORMIN (GLUCOPHAGE) 1000 MG tablet Take 1 tablet (1,000 mg total) by mouth 2 (two) times daily with a meal.   metoprolol succinate (TOPROL-XL) 100 MG 24 hr tablet Take 2 tablets by mouth once daily   Multiple Vitamin (MULTIVITAMIN WITH MINERALS) TABS tablet Take 1 tablet by mouth daily.   OVER THE COUNTER MEDICATION Take 2 capsules by mouth daily. Neu Remedy   pantoprazole (PROTONIX) 40 MG tablet TAKE 1 TABLET BY MOUTH ONCE DAILY .   promethazine (PHENERGAN) 12.5 MG tablet 1-2 tabs po q6h prn nausea   rosuvastatin (CRESTOR) 10 MG tablet Take 1 tablet (10 mg total) by mouth daily.   verapamil (CALAN-SR) 240 MG CR tablet Take 1 tablet (240 mg total) by mouth at bedtime.   VICTOZA 18 MG/3ML SOPN INJECT 1.2MG INTO THE SKIN DAILY   HYDROcodone-acetaminophen (NORCO/VICODIN) 5-325 MG tablet Take 1-2 tablets by mouth every 6 (six) hours as needed for moderate pain. (Patient not taking: Reported on 02/07/2021)   metoCLOPramide (REGLAN) 5 MG tablet TAKE 1 TABLET BY MOUTH 4 TIMES DAILY BEFORE MEAL(S) AND AT BEDTIME (Patient not taking: Reported on 02/07/2021)   predniSONE (DELTASONE) 5 MG tablet 2 tabs po qd x 7d, then 1 tab po qd x 7d (Patient not taking: Reported  on 02/07/2021)   No current facility-administered medications for this visit. (Other)   REVIEW OF SYSTEMS: ROS   Positive for: Endocrine, Eyes Negative for: Constitutional, Gastrointestinal, Neurological, Skin, Genitourinary, Musculoskeletal, HENT, Cardiovascular, Respiratory, Psychiatric, Allergic/Imm, Heme/Lymph Last edited by Kingsley Spittle, COT on 02/07/2021  1:10 PM.      ALLERGIES Allergies  Allergen Reactions   Gluten Meal Swelling   Lisinopril Cough   Pioglitazone Other (See Comments)    ELEVATED glucoses + worse chronic nausea    PAST MEDICAL HISTORY Past Medical History:  Diagnosis Date   Abdominal  bloating    likely from diab gastroparesis.  Dr. Havery Moros started trial of reglan 03/2019.   Benign brain tumor ([place])    Cystic lesion in cerebral aqueduct region with mild hydrocephalus-- stable MRI 02/2016.  Surveillance MRI 05/2017 --dilated cerebral aqueduct related to aqueductal stenosis and subsequent mild hydrocephalus (due to the 11 mm stable cystic lesion in cerebral aqueduct---?congenitial?.   Cataract    OU   Dysmenorrhea    vicodin occ during first 2 days of cycle.   Gluten intolerance    pt reports she underwent full GI w/u to r/o celiac dz   Hepatic steatosis    ultrasound 08/2017. Hx of very mild elevation of ALT.  Stable on u/s 06/2020   History of adenomatous polyp of colon 04/08/2019   recall Feb 2024   Hyperlipidemia, mixed    Hypertension    +white coat component   Hypertensive retinopathy of both eyes    Insomnia    Iron deficiency 01/2019   Hb 11.3. Hemoccults neg x 3 03/19/19. EGD and colonoscopy 04/08/19 showed NO cause for IDA.  Pt does have menorrhagia, though, so she'll see her GYN.  started FeSO4 325 qd approx 04/14/19.   Menorrhagia    resulting in IDA 2021   Proliferative diabetic retinopathy of both eyes ([place])    steroid injections 10/2017--improved   Sensorineural hearing loss of left ear    Sudden left hearing loss summer 2016--no improvement with steroids 01/2015 so brain MRI done by Dr. Redmond Baseman and it showed brain tumor that was determined to be benign.  Pt's hearing not bad enough for hearing aid as of 06/2016.   Type 2 diabetes with complication (HCC)    +microalbuminuria, diab retpthy, diabetic gastroparesis (gastric emptying study mildly abnl 03/2017).  Recommended lantus 08/2018 but pt declined. Mild microalbuminuria.   Uterine fibroid 2022   per pt report, pelvic u/s in GYN office   Past Surgical History:  Procedure Laterality Date   ANOSCOPY  05/12/2019   Procedure: normal exam, minimal hemorrhoid disease. Hyertrophied anal papila, benign appearing,  posterior midline. Surgeon: Leighton Ruff MD   CHOLECYSTECTOMY  2000   COLONOSCOPY  04/08/2019   5 adenomas, recall 3 yrs; no cause for IDA found.  Hypertrophied anal papillae->bx showed low grade dysplasia; GI referred her to colorectal surgeon.   ESOPHAGOGASTRODUODENOSCOPY  04/08/2019   mild chronic reactive gastritis. H pylori NEG.  No cause for IDA found.   GAS INSERTION Right 10/31/2018   Procedure: Insertion Of C3F8 Gas;  Surgeon: Bernarda Caffey, MD;  Location: [place];  Service: Ophthalmology;  Laterality: Right;   GASTRIC EMPTYING SCAN  04/20/2017   Mildly abnormal, particularly the 1st hour of emptying.   MEMBRANE PEEL Right 10/31/2018   Procedure: MEMBRANE PEEL;  Surgeon: Bernarda Caffey, MD;  Location: [place];  Service: Ophthalmology;  Laterality: Right;   PARS PLANA VITRECTOMY Right 04/12/2018   Procedure: Right PARS PLANA  VITRECTOMY WITH 25 GAUGE with intravitreal antibiotics;  Surgeon: Bernarda Caffey, MD;  Location: [place];  Service: Ophthalmology;  Laterality: Right;   PARS PLANA VITRECTOMY Right 10/31/2018   Procedure: PARS PLANA VITRECTOMY WITH 25 GAUGE;  Surgeon: Bernarda Caffey, MD;  Location: [place];  Service: Ophthalmology;  Laterality: Right;   PHOTOCOAGULATION WITH LASER Right 10/31/2018   Procedure: Photocoagulation With Laser;  Surgeon: Bernarda Caffey, MD;  Location: [place];  Service: Ophthalmology;  Laterality: Right;   TRANSTHORACIC ECHOCARDIOGRAM  08/23/2020   Grd I DD, o/w normal.    FAMILY HISTORY Family History  Problem Relation Age of Onset   Brain cancer Mother    Diabetes Father    Diabetes Maternal Grandmother    Cataracts Maternal Grandmother    Cervical cancer Paternal Grandmother    Colon cancer Maternal Grandfather 44   Amblyopia Neg Hx    Blindness Neg Hx    Glaucoma Neg Hx    Macular degeneration Neg Hx    Retinal detachment Neg Hx    Strabismus Neg Hx    Retinitis pigmentosa Neg Hx    Esophageal cancer Neg Hx    Stomach cancer Neg Hx    Rectal  cancer Neg Hx    SOCIAL HISTORY Social History   Tobacco Use   Smoking status: Never   Smokeless tobacco: Never  Vaping Use   Vaping Use: Never used  Substance Use Topics   Alcohol use: No   Drug use: No       OPHTHALMIC EXAM:  Base Eye Exam     Visual Acuity (Snellen - Linear)       Right Left   Dist [place] 20/30 -2    Dist cc  20/30 +1   Dist ph Vivian NI    Dist ph cc  20/25 -1    Correction: Glasses         Tonometry (Tonopen, 1:20 PM)       Right Left   Pressure 16 13         Pupils       Dark Light Shape React APD   Right 3 2 Round Brisk None   Left 3 2 Round Brisk None         Visual Fields (Counting fingers)       Left Right    Full Full         Extraocular Movement       Right Left    Full, Ortho Full, Ortho         Neuro/Psych     Oriented x3: Yes   Mood/Affect: Normal         Dilation     Both eyes: 1.0% Mydriacyl, 2.5% Phenylephrine @ 1:21 PM           Slit Lamp and Fundus Exam     Slit Lamp Exam       Right Left   Lids/Lashes Dermatochalasis - upper lid, mild Meibomian gland dysfunction, Telangiectasia Dermatochalasis - upper lid, Meibomian gland dysfunction, Telangiectasia   Conjunctiva/Sclera White and quiet White and quiet   Cornea Clear, well healed temporal cataract wounds Trace Punctate epithelial erosions   Anterior Chamber Deep and quiet Deep and quiet   Iris Round and dilated, No NVI Round and dilated, No NVI   Lens PC IOL in good position, 1-2+ Posterior capsular opacification, PC folds 2-3+ Nuclear sclerosis, 2-3+ Cortical cataract   Anterior Vitreous post vitrectomy, trace pigment Vitreous syneresis, blood stained vitreous condensations setteled  inferiorly and white, persistent blood stained floater just IN to disc - improving -- now white in color         Fundus Exam       Right Left   Disc mild Pallor, Sharp rim, temporal PPA Pink and sharp, Compact, PPA   C/D Ratio 0.2 0.0   Macula Flat, good  foveal reflex, mild cystic changes ST to fovea -- slighlty improved, persistent punctate exudate - slightly improved, scattered MA ST mac -- improved, +focal laser changes Flat, good foveal reflex, trace cystic changes -- improved, scattered MA; trace ERM, light focal laser changes   Vessels attenuated, Tortuous attenuated, mild Tortuousity   Periphery Attached, scattered DBH greatest superiorly, 360 PRP, good laser fill in 360 attached, scattered IRH; 360 PRP scars -- with good fill in changes           Refraction     Wearing Rx       Sphere Cylinder   Right -3.75 Sphere   Left -3.75 Sphere           IMAGING AND PROCEDURES  Imaging and Procedures for _0 @  OCT, Retina - OU - Both Eyes       Right Eye Quality was good. Central Foveal Thickness: 360. Progression has improved. Findings include abnormal foveal contour, epiretinal membrane, no SRF, intraretinal hyper-reflective material, intraretinal fluid (Mild Interval improvement in IRF and foveal contour).   Left Eye Quality was good. Central Foveal Thickness: 274. Progression has been stable. Findings include normal foveal contour, no SRF, intraretinal hyper-reflective material, intraretinal fluid (non-central cystic changes improved).   Notes *Images captured and stored on drive  Diagnosis / Impression:  DME OU OD: Mild Interval improvement in IRF and foveal contour OS: non-central cystic changes improved  Clinical management:  See below  Abbreviations: NFP - Normal foveal profile. CME - cystoid macular edema. PED - pigment epithelial detachment. IRF - intraretinal fluid. SRF - subretinal fluid. EZ - ellipsoid zone. ERM - epiretinal membrane. ORA - outer retinal atrophy. ORT - outer retinal tubulation. SRHM - subretinal hyper-reflective material       Intravitreal Injection, Pharmacologic Agent - OD - Right Eye       Time Out 02/07/2021. 2:17 PM. Confirmed correct patient, procedure, site, and patient  consented.   Anesthesia Topical anesthesia was used. Anesthetic medications included Lidocaine 2%, Proparacaine 0.5%.   Procedure Preparation included 5% betadine to ocular surface, eyelid speculum. A (32g) needle was used.   Injection: 2 mg aflibercept 2 MG/0.05ML   Route: Intravitreal, Site: Right Eye   NDC: [id number], Lot: [id number], Expiration date: 12/19/2020, Waste: 0.05 mL   Post-op Post injection exam found visual acuity of at least counting fingers. The patient tolerated the procedure well. There were no complications. The patient received written and verbal post procedure care education. Post injection medications were not given.            ASSESSMENT/PLAN:   ICD-10-CM   1. Proliferative diabetic retinopathy of both eyes with macular edema associated with type 2 diabetes mellitus (HCC)  R97.5883 Intravitreal Injection, Pharmacologic Agent - OD - Right Eye    aflibercept (EYLEA) SOLN 2 mg    2. Retinal edema  H35.81 OCT, Retina - OU - Both Eyes    3. Vitreous hemorrhage of left eye (HCC)  H43.12     4. Right endophthalmia  H44.001     5. Vitreous hemorrhage, right eye ([place])  H43.11     6. Essential hypertension  I10  7. Hypertensive retinopathy of both eyes  H35.033     8. Combined forms of age-related cataract of left eye  H25.812     9. Pseudophakia of right eye  Z96.1      1,2.  Proliferative diabetic retinopathy w/ DME, OU  - HbA1c 8.6% (9.14.22), 7.8% (06.13.22), 9.1% (02.24.22) 11.0% (11.22.21)  - s/p IVA OD #1 9.20.19, #2 (10.25.19), #3 (11.15.19), #4 (12.17.19), #5 (01.14.20), #6 (2.11.20), #7 (05.29.20), #8 (08.19.20), #9 (10.30.20), #10 (12.09.20), #11 (01.11.21), #12 (02.15.21), #13 (03.23.21), #14 (04.20.21), #15 (06.08.21), #16 (07.06.21) -- IVA resistance  - s/p IVA OS #1 9.27.19, #2 (10.25.19), #3 (11.15.19), #4 (12.17.19), #5 (01.14.20), #6 (2.11.20), #7 (04.26.20), #8 (05.29.20), #9 (06.26.20), #10 (08.05.20), #11 (11.11.20), #12  (12.09.20), #13 (01.11.21), #14 (02.15.21), #15 (03.23.21), #16 (04.20.21), #17 (06.08.21) -- IVA resistance, #18 (09.27.21)  - s/p IVE OD #1 (08.03.21) -- sample, #2 (09.10.21), #3 (10.08.21), #4 (11.23.21), #5 (12.21.21), #6 (01.19.22) sample, #7 (2.16.22), #8 (3.22.22), #9 (04.19.22), #10 (05.27.22), #11 (06.29.22), #12 (08.03.22), #13 (09.07.22), #14 (10.10.22), #15 (11.14.22)  - s/p IVE OS #1 (10.25.21), #2 (11.23.21), #3 (12.21.21), #4 (3.22.22), #5 (06.29.22), #6 (08.03.22), #7 (09.07.22), #8 (10.10.22), #9 (11.14.22)  - S/P PRP OS (09.20.19), (5.19.20), (08.19.20), (04.28.21), (02.02.22)  - S/P PRP OD (9.27.19 and 11.21.19), fill-in (04.14.20) (09.03.20, surgery)  - S/P focal laser OS (07.06.21), OD (09.20.22)  - FA (9.20.19) shows +NVE OU and leaking MA and capillary nonperfusion  - repeat FA 11.15.19 shows NV regressing OU  - pre-op: OD w/ VA stable at 20/25, but there is some preretinal fibrosis / tractional membranes just superior to disc and mild central DME  - s/p 25g PPV+MP+10% C3F8 gas OD (09.03.20) -- ERM/PRF removal OD  - BCVA 20/30 OD, 20/25 OS             - fibrosis/ERM stably improved; retina attached  - OCT shows OD: mild interval improvement in IRF central and temporal macula; OS: non-central cystic changes slightly improved at 4 wks  - recommend IVE OD #16 today, 12.12.22  - will hold off on OS today  - pt wishes in agreement  - RBA of procedure discussed, questions answered - see procedure note  - [place]ast[place] informed consent form signed and scanned on 01.11.2021 (OU)  - Eylea informed consent form signed and scanned on 08.03.2021  - [email or web address] benefits investigation started, 08.03.21 -- approved as of 09.10.21 -- 2022 insurance verified as of 02.16.22  - f/u 4 weeks, DFE, OCT, possible injection  3. Vitreous hemorrhage OS -- recurrent, improved  - recurrent VH, onset 09.22.21  - etiology: secondary to PDR as described above (no RT/RD on exam)  - s/p PRP OS (9.20.19),  (05.19.20), (08.19.20), (04.28.21), (02.02.22)  - s/p IVA OS on 4.26.20, 5.29.20, 6.26.20, 8.5.20,11.11.20, 12.06.20 and so on as above  - VH clear centrally and settled inferiorly -- now white  - BCVA stable at 20/25  - f/u 4 weeks DFE, OCT, possible injection  4. History of Endophthalmitis OD  - s/p IVA OU 04/09/2018  - s/p 25g PPV w/ intravitreal vanc, ceftaz and cefepime OD, 2.14.2020  - s/p intravitreal tap / vanc and ceflaz injections (02.16.20)             - gram stain (2.14.20) shows G+ cocci, WBCs mostly PMNs;   - repeat gram stain from t/i (2.16.20) -- no organisms, just WBCs             - cultures from vitreous grew rare Staph  warneri; cultures from t/i -- no growth             - doing well, BCVA 20/25             - inflammation/posterior debris resolved             - IOP 16 off Brimonidine  - completed po pred taper -- caused significant elevations in BG  - monitor  5. History of Vitreous Hemorrhage OD -- cleared from PPV x2 for endophthalmitis and ERM/preretinal fibrosis  - secondary to PDR as described below  - S/P IVA OD #1 (09.20.19), #2 (10.25.19), #3 (11.15.19), #4 (12.16.19), #5 (03/12/2018)  - S/P PRP OD #1 (09.27.19), fill in OD (11.21.19) -- each somewhat limited inferiorly by residual VH  6,7. Hypertensive retinopathy OU  - BP elevated today (9.27.21) -- 164/91, left arm  - discussed importance of tight BP control.  - monitor  8. Combined form age related cataract OS-   - The symptoms of cataract, surgical options, and treatments and risks were discussed with patient.  - discussed diagnosis and progression  9. Pseudophakia OD  - s/p CE/IOL OD (Dr. Zenia Resides, 12.11.20)  - beautiful surgery, doing well  Ophthalmic Meds Ordered this visit:  Meds ordered this encounter  Medications   aflibercept (EYLEA) SOLN 2 mg     Return in about 4 weeks (around 03/07/2021) for f/u PDR OU, DFE, OCT.  There are no Patient Instructions on file for this visit.  This  document serves as a record of services personally performed by Gardiner Sleeper, MD, PhD. It was created on their behalf by Roselee Nova, COMT. The creation of this record is the provider's dictation and/or activities during the visit.  Electronically signed by: Roselee Nova, COMT 02/07/21 2:19 PM  This document serves as a record of services personally performed by Gardiner Sleeper, MD, PhD. It was created on their behalf by San Jetty. Owens Shark, OA an ophthalmic technician. The creation of this record is the provider's dictation and/or activities during the visit.    Electronically signed by: San Jetty. Owens Shark, OA  2:19 PM  Gardiner Sleeper, M.D., Ph.D. Diseases & Surgery of the Retina and Vitreous [facility]  I have reviewed the above documentation for accuracy and completeness, and I agree with the above. Gardiner Sleeper, M.D., Ph.D. 02/07/21 2:22 PM   Abbreviations: M myopia (nearsighted); A astigmatism; H hyperopia (farsighted); P presbyopia; Mrx spectacle prescription;  CTL contact lenses; OD right eye; OS left eye; OU both eyes  XT exotropia; ET esotropia; PEK punctate epithelial keratitis; PEE punctate epithelial erosions; DES dry eye syndrome; MGD meibomian gland dysfunction; ATs artificial tears; PFAT's preservative free artificial tears; [place] nuclear sclerotic cataract; PSC posterior subcapsular cataract; ERM epi-retinal membrane; PVD posterior vitreous detachment; RD retinal detachment; DM diabetes mellitus; DR diabetic retinopathy; NPDR non-proliferative diabetic retinopathy; PDR proliferative diabetic retinopathy; CSME clinically significant macular edema; DME diabetic macular edema; dbh dot blot hemorrhages; CWS cotton wool spot; POAG primary open angle glaucoma; C/D cup-to-disc ratio; HVF humphrey visual field; GVF goldmann visual field; OCT optical coherence tomography; IOP intraocular pressure; BRVO Branch retinal vein occlusion; CRVO central retinal vein occlusion;  CRAO central retinal artery occlusion; BRAO branch retinal artery occlusion; RT retinal tear; SB scleral buckle; PPV pars plana vitrectomy; VH Vitreous hemorrhage; PRP panretinal laser photocoagulation; IVK intravitreal kenalog; VMT vitreomacular traction; MH Macular hole;  NVD neovascularization of the disc; NVE neovascularization elsewhere; AREDS age related eye  disease study; ARMD age related macular degeneration; POAG primary open angle glaucoma; EBMD epithelial/anterior basement membrane dystrophy; ACIOL anterior chamber intraocular lens; IOL intraocular lens; PCIOL posterior chamber intraocular lens; Phaco/IOL phacoemulsification with intraocular lens placement; [place] photorefractive keratectomy; LASIK laser assisted in situ keratomileusis; HTN hypertension; DM diabetes mellitus; COPD chronic obstructive pulmonary disease

## 2021-02-07 ENCOUNTER — Other Ambulatory Visit: Payer: Self-pay

## 2021-02-07 ENCOUNTER — Encounter (INDEPENDENT_AMBULATORY_CARE_PROVIDER_SITE_OTHER): Payer: Self-pay | Admitting: Ophthalmology

## 2021-02-07 ENCOUNTER — Ambulatory Visit (INDEPENDENT_AMBULATORY_CARE_PROVIDER_SITE_OTHER): Payer: 59 | Admitting: Ophthalmology

## 2021-02-07 DIAGNOSIS — I10 Essential (primary) hypertension: Secondary | ICD-10-CM | POA: Diagnosis not present

## 2021-02-07 DIAGNOSIS — H44001 Unspecified purulent endophthalmitis, right eye: Secondary | ICD-10-CM

## 2021-02-07 DIAGNOSIS — H3581 Retinal edema: Secondary | ICD-10-CM

## 2021-02-07 DIAGNOSIS — E113513 Type 2 diabetes mellitus with proliferative diabetic retinopathy with macular edema, bilateral: Secondary | ICD-10-CM | POA: Diagnosis not present

## 2021-02-07 DIAGNOSIS — H4311 Vitreous hemorrhage, right eye: Secondary | ICD-10-CM

## 2021-02-07 DIAGNOSIS — H25812 Combined forms of age-related cataract, left eye: Secondary | ICD-10-CM

## 2021-02-07 DIAGNOSIS — H35033 Hypertensive retinopathy, bilateral: Secondary | ICD-10-CM

## 2021-02-07 DIAGNOSIS — Z961 Presence of intraocular lens: Secondary | ICD-10-CM

## 2021-02-07 DIAGNOSIS — H4312 Vitreous hemorrhage, left eye: Secondary | ICD-10-CM

## 2021-02-07 DIAGNOSIS — H4313 Vitreous hemorrhage, bilateral: Secondary | ICD-10-CM

## 2021-02-07 MED ORDER — AFLIBERCEPT 2MG/0.05ML IZ SOLN FOR KALEIDOSCOPE
2.0000 mg | INTRAVITREAL | Status: AC | PRN
Start: 2021-02-07 — End: 2021-02-07
  Administered 2021-02-07: 2 mg via INTRAVITREAL

## 2021-03-04 NOTE — Progress Notes (Shared)
[facility] Note  03/07/2021     CHIEF COMPLAINT Patient presents for No chief complaint on file.    HISTORY OF PRESENT ILLNESS: Gloria Gomez is a 51 y.o. female who presents to the clinic today for:   Pt states her vision may be improving, but she still has some blurriness, she goes back to see her endocrinologist next week, her long acting insulin was reduced from 90-100 units to 30-40 units, pt will also see her PCP next week to get a referral to a new rheumotologist    Referring physician:   HISTORICAL INFORMATION:   Selected notes from the MEDICAL RECORD NUMBER Referred for DM exam   CURRENT MEDICATIONS: No current outpatient medications on file. (Ophthalmic Drugs)   No current facility-administered medications for this visit. (Ophthalmic Drugs)   Current Outpatient Medications (Other)  Medication Sig   amLODipine (NORVASC) 5 MG tablet Take 1 tablet (5 mg total) by mouth daily.   furosemide (LASIX) 20 MG tablet TAKE 2 TABLETS BY MOUTH IN THE MORNING AND 1 IN THE EVENING   gabapentin (NEURONTIN) 300 MG capsule Take 1 capsule (300 mg total) by mouth 3 (three) times daily.   HYDROcodone-acetaminophen (NORCO/VICODIN) 5-325 MG tablet Take 1-2 tablets by mouth every 6 (six) hours as needed for moderate pain. (Patient not taking: Reported on 02/07/2021)   LANTUS SOLOSTAR 100 UNIT/ML Solostar Pen INJECT 90 TO 100 UNITS SUBCUTANEOUSLY AT BEDTIME GRADUALLY TITRATING TO THIS DOSE TO GET [employer]ARGET FASTING GLUCOSE RANGE OF 100-110 (Patient taking differently: 30-40 Units. INJECT 90 TO 100 UNITS SUBCUTANEOUSLY AT BEDTIME GRADUALLY TITRATING TO THIS DOSE TO GET TARGET FASTING GLUCOSE RANGE OF 100-110)   losartan (COZAAR) 100 MG tablet Take 1 tablet by mouth once daily   metFORMIN (GLUCOPHAGE) 1000 MG tablet Take 1 tablet (1,000 mg total) by mouth 2 (two) times daily with a meal.   metoCLOPramide (REGLAN) 5 MG tablet TAKE 1 TABLET BY MOUTH 4 TIMES DAILY BEFORE  MEAL(S) AND AT BEDTIME (Patient not taking: Reported on 02/07/2021)   metoprolol succinate (TOPROL-XL) 100 MG 24 hr tablet Take 2 tablets by mouth once daily   Multiple Vitamin (MULTIVITAMIN WITH MINERALS) TABS tablet Take 1 tablet by mouth daily.   OVER THE COUNTER MEDICATION Take 2 capsules by mouth daily. Neu Remedy   pantoprazole (PROTONIX) 40 MG tablet TAKE 1 TABLET BY MOUTH ONCE DAILY .   predniSONE (DELTASONE) 5 MG tablet 2 tabs po qd x 7d, then 1 tab po qd x 7d (Patient not taking: Reported on 02/07/2021)   promethazine (PHENERGAN) 12.5 MG tablet 1-2 tabs po q6h prn nausea   rosuvastatin (CRESTOR) 10 MG tablet Take 1 tablet (10 mg total) by mouth daily.   verapamil (CALAN-SR) 240 MG CR tablet Take 1 tablet (240 mg total) by mouth at bedtime.   VICTOZA 18 MG/3ML SOPN INJECT 1.2MG  INTO THE SKIN DAILY   No current facility-administered medications for this visit. (Other)   REVIEW OF SYSTEMS:    ALLERGIES Allergies  Allergen Reactions   Gluten Meal Swelling   Lisinopril Cough   Pioglitazone Other (See Comments)    ELEVATED glucoses + worse chronic nausea    PAST MEDICAL HISTORY Past Medical History:  Diagnosis Date   Abdominal bloating    likely from diab gastroparesis.  Dr. Havery Moros started trial of reglan 03/2019.   Benign brain tumor ([place])    Cystic lesion in cerebral aqueduct region with mild hydrocephalus-- stable MRI 02/2016.  Surveillance MRI  05/2017 --dilated cerebral aqueduct related to aqueductal stenosis and subsequent mild hydrocephalus (due to the 11 mm stable cystic lesion in cerebral aqueduct---?congenitial?.   Cataract    OU   Dysmenorrhea    vicodin occ during first 2 days of cycle.   Gluten intolerance    pt reports she underwent full GI w/u to r/o celiac dz   Hepatic steatosis    ultrasound 08/2017. Hx of very mild elevation of ALT.  Stable on u/s 06/2020   History of adenomatous polyp of colon 04/08/2019   recall Feb 2024   Hyperlipidemia, mixed     Hypertension    +white coat component   Hypertensive retinopathy of both eyes    Insomnia    Iron deficiency 01/2019   Hb 11.3. Hemoccults neg x 3 03/19/19. EGD and colonoscopy 04/08/19 showed NO cause for IDA.  Pt does have menorrhagia, though, so she'll see her GYN.  started FeSO4 325 qd approx 04/14/19.   Menorrhagia    resulting in IDA 2021   Proliferative diabetic retinopathy of both eyes ([place])    steroid injections 10/2017--improved   Sensorineural hearing loss of left ear    Sudden left hearing loss summer 2016--no improvement with steroids 01/2015 so brain MRI done by Dr. Redmond Baseman and it showed brain tumor that was determined to be benign.  Pt's hearing not bad enough for hearing aid as of 06/2016.   Type 2 diabetes with complication (HCC)    +microalbuminuria, diab retpthy, diabetic gastroparesis (gastric emptying study mildly abnl 03/2017).  Recommended lantus 08/2018 but pt declined. Mild microalbuminuria.   Uterine fibroid 2022   per pt report, pelvic u/s in GYN office   Past Surgical History:  Procedure Laterality Date   ANOSCOPY  05/12/2019   Procedure: normal exam, minimal hemorrhoid disease. Hyertrophied anal papila, benign appearing, posterior midline. Surgeon: Leighton Ruff MD   CHOLECYSTECTOMY  2000   COLONOSCOPY  04/08/2019   5 adenomas, recall 3 yrs; no cause for IDA found.  Hypertrophied anal papillae->bx showed low grade dysplasia; GI referred her to colorectal surgeon.   ESOPHAGOGASTRODUODENOSCOPY  04/08/2019   mild chronic reactive gastritis. H pylori NEG.  No cause for IDA found.   GAS INSERTION Right 10/31/2018   Procedure: Insertion Of C3F8 Gas;  Surgeon: Bernarda Caffey, MD;  Location: [place];  Service: Ophthalmology;  Laterality: Right;   GASTRIC EMPTYING SCAN  04/20/2017   Mildly abnormal, particularly the 1st hour of emptying.   MEMBRANE PEEL Right 10/31/2018   Procedure: MEMBRANE PEEL;  Surgeon: Bernarda Caffey, MD;  Location: [place];  Service: Ophthalmology;   Laterality: Right;   PARS PLANA VITRECTOMY Right 04/12/2018   Procedure: Right PARS PLANA VITRECTOMY WITH 25 GAUGE with intravitreal antibiotics;  Surgeon: Bernarda Caffey, MD;  Location: [place];  Service: Ophthalmology;  Laterality: Right;   PARS PLANA VITRECTOMY Right 10/31/2018   Procedure: PARS PLANA VITRECTOMY WITH 25 GAUGE;  Surgeon: Bernarda Caffey, MD;  Location: [place];  Service: Ophthalmology;  Laterality: Right;   PHOTOCOAGULATION WITH LASER Right 10/31/2018   Procedure: Photocoagulation With Laser;  Surgeon: Bernarda Caffey, MD;  Location: [place];  Service: Ophthalmology;  Laterality: Right;   TRANSTHORACIC ECHOCARDIOGRAM  08/23/2020   Grd I DD, o/w normal.    FAMILY HISTORY Family History  Problem Relation Age of Onset   Brain cancer Mother    Diabetes Father    Diabetes Maternal Grandmother    Cataracts Maternal Grandmother    Cervical cancer Paternal Grandmother  Colon cancer Maternal Grandfather 38   Amblyopia Neg Hx    Blindness Neg Hx    Glaucoma Neg Hx    Macular degeneration Neg Hx    Retinal detachment Neg Hx    Strabismus Neg Hx    Retinitis pigmentosa Neg Hx    Esophageal cancer Neg Hx    Stomach cancer Neg Hx    Rectal cancer Neg Hx    SOCIAL HISTORY Social History   Tobacco Use   Smoking status: Never   Smokeless tobacco: Never  Vaping Use   Vaping Use: Never used  Substance Use Topics   Alcohol use: No   Drug use: No       OPHTHALMIC EXAM:  Not recorded    IMAGING AND PROCEDURES  Imaging and Procedures for @TODAY @          ASSESSMENT/PLAN:   ICD-10-CM   1. Proliferative diabetic retinopathy of both eyes with macular edema associated with type 2 diabetes mellitus ([place])  V37.1062     2. Vitreous hemorrhage of left eye (HCC)  H43.12     3. Right endophthalmia  H44.001     4. Vitreous hemorrhage, right eye ([place])  H43.11     5. Essential hypertension  I10     6. Hypertensive retinopathy of both eyes  H35.033     7. Combined forms  of age-related cataract of left eye  H25.812     8. Pseudophakia of right eye  Z96.1       1.  Proliferative diabetic retinopathy w/ DME, OU  - HbA1c 8.6% (9.14.22), 7.8% (06.13.22), 9.1% (02.24.22) 11.0% (11.22.21)  - s/p IVA OD #1 9.20.19, #2 (10.25.19), #3 (11.15.19), #4 (12.17.19), #5 (01.14.20), #6 (2.11.20), #7 (05.29.20), #8 (08.19.20), #9 (10.30.20), #10 (12.09.20), #11 (01.11.21), #12 (02.15.21), #13 (03.23.21), #14 (04.20.21), #15 (06.08.21), #16 (07.06.21) -- IVA resistance  - s/p IVA OS #1 9.27.19, #2 (10.25.19), #3 (11.15.19), #4 (12.17.19), #5 (01.14.20), #6 (2.11.20), #7 (04.26.20), #8 (05.29.20), #9 (06.26.20), #10 (08.05.20), #11 (11.11.20), #12 (12.09.20), #13 (01.11.21), #14 (02.15.21), #15 (03.23.21), #16 (04.20.21), #17 (06.08.21) -- IVA resistance, #18 (09.27.21)  - s/p IVE OD #1 (08.03.21) -- sample, #2 (09.10.21), #3 (10.08.21), #4 (11.23.21), #5 (12.21.21), #6 (01.19.22) sample, #7 (2.16.22), #8 (3.22.22), #9 (04.19.22), #10 (05.27.22), #11 (06.29.22), #12 (08.03.22), #13 (09.07.22), #14 (10.10.22), #15 (11.14.22), #16 (12.12.22)  - s/p IVE OS #1 (10.25.21), #2 (11.23.21), #3 (12.21.21), #4 (3.22.22), #5 (06.29.22), #6 (08.03.22), #7 (09.07.22), #8 (10.10.22), #9 (11.14.22)  - S/P PRP OS (09.20.19), (5.19.20), (08.19.20), (04.28.21), (02.02.22)  - S/P PRP OD (9.27.19 and 11.21.19), fill-in (04.14.20) (09.03.20, surgery)  - S/P focal laser OS (07.06.21), OD (09.20.22)  - FA (9.20.19) shows +NVE OU and leaking MA and capillary nonperfusion  - repeat FA 11.15.19 shows NV regressing OU  - pre-op: OD w/ VA stable at 20/25, but there is some preretinal fibrosis / tractional membranes just superior to disc and mild central DME  - s/p 25g PPV+MP+10% C3F8 gas OD (09.03.20) -- ERM/PRF removal OD  - BCVA 20/30 OD, 20/25 OS             - fibrosis/ERM stably improved; retina attached  - OCT shows OD: mild interval improvement in IRF central and temporal macula; OS: non-central cystic  changes slightly improved at 4 wks  - recommend IVE OD #17 today, 03/07/21  - will hold off on OS today  - pt wishes in agreement  - RBA of procedure discussed, questions answered - see procedure note  -  [place] informed consent form signed and scanned on 01.11.2021 (OU)  - [place] informed consent form signed and scanned on 08.03.2021  - [email or web address] benefits investigation started, 08.03.21 -- approved as of 09.10.21 -- 2022 insurance verified as of 02.16.22  - f/u 4 weeks, DFE, OCT, possible injection  2. Vitreous hemorrhage OS -- recurrent, improved  - recurrent VH, onset 09.22.21  - etiology: secondary to PDR as described above (no RT/RD on exam)  - s/p PRP OS (9.20.19), (05.19.20), (08.19.20), (04.28.21), (02.02.22)  - s/p IVA OS on 4.26.20, 5.29.20, 6.26.20, 8.5.20,11.11.20, 12.06.20 and so on as above  - VH clear centrally and settled inferiorly -- now white  - BCVA stable at 20/25  - f/u in 4 weeks DFE, OCT, possible injection  3. History of Endophthalmitis OD  - s/p IVA OU 04/09/2018  - s/p 25g PPV w/ intravitreal vanc, ceftaz and cefepime OD, 2.14.2020  - s/p intravitreal tap / vanc and ceflaz injections (02.16.20)             - gram stain (2.14.20) shows G+ cocci, WBCs mostly PMNs;   - repeat gram stain from t/i (2.16.20) -- no organisms, just WBCs             - cultures from vitreous grew rare Staph warneri; cultures from t/i -- no growth             - doing well, BCVA 20/25             - inflammation/posterior debris resolved             - IOP 16 off Brimonidine  - completed po pred taper -- caused significant elevations in BG  - monitor  4. History of Vitreous Hemorrhage OD -- cleared from PPV x2 for endophthalmitis and ERM/preretinal fibrosis  - secondary to PDR as described below  - S/P IVA OD #1 (09.20.19), #2 (10.25.19), #3 (11.15.19), #4 (12.16.19), #5 (03/12/2018)  - S/P PRP OD #1 (09.27.19), fill in OD (11.21.19) -- each somewhat limited inferiorly by residual  VH  5,6. Hypertensive retinopathy OU  - BP elevated today (9.27.21) -- 164/91, left arm  - discussed importance of tight BP control.  - monitor  7. Combined form age related cataract OS-   - The symptoms of cataract, surgical options, and treatments and risks were discussed with patient.  - discussed diagnosis and progression  8. Pseudophakia OD  - s/p CE/IOL OD (Dr. Zenia Resides, 12.11.20)  - beautiful surgery, doing well  Ophthalmic Meds Ordered this visit:  No orders of the defined types were placed in this encounter.    No follow-ups on file.  There are no Patient Instructions on file for this visit.  This document serves as a record of services personally performed by Gardiner Sleeper, MD, PhD. It was created on their behalf by Roselee Nova, COMT. The creation of this record is the provider's dictation and/or activities during the visit.  Electronically signed by: Roselee Nova, COMT 03/04/21 2:30 PM   Gardiner Sleeper, M.D., Ph.D. Diseases & Surgery of the Retina and Vitreous [facility]: M myopia (nearsighted); A astigmatism; H hyperopia (farsighted); P presbyopia; Mrx spectacle prescription;  CTL contact lenses; OD right eye; OS left eye; OU both eyes  XT exotropia; ET esotropia; PEK punctate epithelial keratitis; PEE punctate epithelial erosions; DES dry eye syndrome; MGD meibomian gland dysfunction; ATs artificial tears; PFAT's preservative free artificial tears; [place] nuclear sclerotic cataract;  PSC posterior subcapsular cataract; ERM epi-retinal membrane; PVD posterior vitreous detachment; RD retinal detachment; DM diabetes mellitus; DR diabetic retinopathy; NPDR non-proliferative diabetic retinopathy; PDR proliferative diabetic retinopathy; CSME clinically significant macular edema; DME diabetic macular edema; dbh dot blot hemorrhages; CWS cotton wool spot; POAG primary open angle glaucoma; C/D cup-to-disc ratio; HVF humphrey visual  field; GVF goldmann visual field; OCT optical coherence tomography; IOP intraocular pressure; BRVO Branch retinal vein occlusion; CRVO central retinal vein occlusion; CRAO central retinal artery occlusion; BRAO branch retinal artery occlusion; RT retinal tear; SB scleral buckle; PPV pars plana vitrectomy; VH Vitreous hemorrhage; PRP panretinal laser photocoagulation; IVK intravitreal kenalog; VMT vitreomacular traction; MH Macular hole;  NVD neovascularization of the disc; NVE neovascularization elsewhere; AREDS age related eye disease study; ARMD age related macular degeneration; POAG primary open angle glaucoma; EBMD epithelial/anterior basement membrane dystrophy; ACIOL anterior chamber intraocular lens; IOL intraocular lens; PCIOL posterior chamber intraocular lens; Phaco/IOL phacoemulsification with intraocular lens placement; [place] photorefractive keratectomy; LASIK laser assisted in situ keratomileusis; HTN hypertension; DM diabetes mellitus; COPD chronic obstructive pulmonary disease

## 2021-03-07 ENCOUNTER — Encounter (INDEPENDENT_AMBULATORY_CARE_PROVIDER_SITE_OTHER): Payer: Self-pay | Admitting: Ophthalmology

## 2021-03-07 DIAGNOSIS — H44001 Unspecified purulent endophthalmitis, right eye: Secondary | ICD-10-CM

## 2021-03-07 DIAGNOSIS — H35033 Hypertensive retinopathy, bilateral: Secondary | ICD-10-CM

## 2021-03-07 DIAGNOSIS — H25812 Combined forms of age-related cataract, left eye: Secondary | ICD-10-CM

## 2021-03-07 DIAGNOSIS — H4312 Vitreous hemorrhage, left eye: Secondary | ICD-10-CM

## 2021-03-07 DIAGNOSIS — Z961 Presence of intraocular lens: Secondary | ICD-10-CM

## 2021-03-07 DIAGNOSIS — I10 Essential (primary) hypertension: Secondary | ICD-10-CM

## 2021-03-07 DIAGNOSIS — H4311 Vitreous hemorrhage, right eye: Secondary | ICD-10-CM

## 2021-03-07 DIAGNOSIS — E113513 Type 2 diabetes mellitus with proliferative diabetic retinopathy with macular edema, bilateral: Secondary | ICD-10-CM

## 2021-03-08 ENCOUNTER — Other Ambulatory Visit: Payer: Self-pay

## 2021-03-08 ENCOUNTER — Ambulatory Visit (INDEPENDENT_AMBULATORY_CARE_PROVIDER_SITE_OTHER): Payer: 59 | Admitting: Ophthalmology

## 2021-03-08 ENCOUNTER — Encounter (INDEPENDENT_AMBULATORY_CARE_PROVIDER_SITE_OTHER): Payer: Self-pay | Admitting: Ophthalmology

## 2021-03-08 DIAGNOSIS — H25812 Combined forms of age-related cataract, left eye: Secondary | ICD-10-CM

## 2021-03-08 DIAGNOSIS — H44001 Unspecified purulent endophthalmitis, right eye: Secondary | ICD-10-CM

## 2021-03-08 DIAGNOSIS — H4313 Vitreous hemorrhage, bilateral: Secondary | ICD-10-CM

## 2021-03-08 DIAGNOSIS — I10 Essential (primary) hypertension: Secondary | ICD-10-CM | POA: Diagnosis not present

## 2021-03-08 DIAGNOSIS — H35033 Hypertensive retinopathy, bilateral: Secondary | ICD-10-CM | POA: Diagnosis not present

## 2021-03-08 DIAGNOSIS — Z961 Presence of intraocular lens: Secondary | ICD-10-CM

## 2021-03-08 DIAGNOSIS — E113513 Type 2 diabetes mellitus with proliferative diabetic retinopathy with macular edema, bilateral: Secondary | ICD-10-CM | POA: Diagnosis not present

## 2021-03-08 DIAGNOSIS — H4311 Vitreous hemorrhage, right eye: Secondary | ICD-10-CM

## 2021-03-08 DIAGNOSIS — H4312 Vitreous hemorrhage, left eye: Secondary | ICD-10-CM

## 2021-03-08 MED ORDER — AFLIBERCEPT 2MG/0.05ML IZ SOLN FOR KALEIDOSCOPE
2.0000 mg | INTRAVITREAL | Status: AC | PRN
  Administered 2021-03-08: 2 mg via INTRAVITREAL

## 2021-03-08 NOTE — Progress Notes (Signed)
[facility] Note  03/08/2021     CHIEF COMPLAINT Patient presents for Retina Follow Up  HISTORY OF PRESENT ILLNESS: Gloria Gomez is a 51 y.o. female who presents to the clinic today for:  HPI     Retina Follow Up   Patient presents with  Diabetic Retinopathy.  In both eyes.  Duration of 4 weeks.  Since onset it is stable.  I, the attending physician,  performed the HPI with the patient and updated documentation appropriately.        Comments   4 week follow up PDR OU- She doesn't see as well in the mornings that the afternoon.  Vision appears about the same. At night she sees some shadows out of OS.  This is when she is laying down and turns over or gets up at night.   PCP reduced insulin from 100-70-30-24 now.  A1C 7.4, BS 124 this am      Last edited by Bernarda Caffey, MD on 03/08/2021  9:40 AM.    Pt states [facility] OU    Referring physician:   HISTORICAL INFORMATION:   Selected notes from the MEDICAL RECORD NUMBER Referred for DM exam   CURRENT MEDICATIONS: No current outpatient medications on file. (Ophthalmic Drugs)   No current facility-administered medications for this visit. (Ophthalmic Drugs)   Current Outpatient Medications (Other)  Medication Sig   amLODipine (NORVASC) 5 MG tablet Take 1 tablet (5 mg total) by mouth daily.   empagliflozin (JARDIANCE) 10 MG TABS tablet Take 10 mg by mouth daily.   furosemide (LASIX) 20 MG tablet TAKE 2 TABLETS BY MOUTH IN THE MORNING AND 1 IN THE EVENING   gabapentin (NEURONTIN) 300 MG capsule Take 1 capsule (300 mg total) by mouth 3 (three) times daily.   LANTUS SOLOSTAR 100 UNIT/ML Solostar Pen INJECT 90 TO 100 UNITS SUBCUTANEOUSLY AT BEDTIME GRADUALLY TITRATING TO THIS DOSE TO GET TARGET FASTING GLUCOSE RANGE OF 100-110 (Patient taking differently: 30-40 Units. INJECT 90 TO 100 UNITS SUBCUTANEOUSLY AT BEDTIME GRADUALLY TITRATING TO THIS DOSE TO GET TARGET FASTING GLUCOSE RANGE OF 100-110)    losartan (COZAAR) 100 MG tablet Take 1 tablet by mouth once daily   metFORMIN (GLUCOPHAGE) 1000 MG tablet Take 1 tablet (1,000 mg total) by mouth 2 (two) times daily with a meal.   metoprolol succinate (TOPROL-XL) 100 MG 24 hr tablet Take 2 tablets by mouth once daily   Multiple Vitamin (MULTIVITAMIN WITH MINERALS) TABS tablet Take 1 tablet by mouth daily.   OVER THE COUNTER MEDICATION Take 2 capsules by mouth daily. Neu Remedy   pantoprazole (PROTONIX) 40 MG tablet TAKE 1 TABLET BY MOUTH ONCE DAILY .   promethazine (PHENERGAN) 12.5 MG tablet 1-2 tabs po q6h prn nausea   rosuvastatin (CRESTOR) 10 MG tablet Take 1 tablet (10 mg total) by mouth daily.   verapamil (CALAN-SR) 240 MG CR tablet Take 1 tablet (240 mg total) by mouth at bedtime.   VICTOZA 18 MG/3ML SOPN INJECT 1.2MG INTO THE SKIN DAILY   HYDROcodone-acetaminophen (NORCO/VICODIN) 5-325 MG tablet Take 1-2 tablets by mouth every 6 (six) hours as needed for moderate pain. (Patient not taking: Reported on 02/07/2021)   metoCLOPramide (REGLAN) 5 MG tablet TAKE 1 TABLET BY MOUTH 4 TIMES DAILY BEFORE MEAL(S) AND AT BEDTIME (Patient not taking: Reported on 02/07/2021)   predniSONE (DELTASONE) 5 MG tablet 2 tabs po qd x 7d, then 1 tab po qd x 7d (Patient not taking: Reported on 02/07/2021)  No current facility-administered medications for this visit. (Other)   REVIEW OF SYSTEMS: ROS   Positive for: Endocrine, Eyes Negative for: Constitutional, Gastrointestinal, Neurological, Skin, Genitourinary, Musculoskeletal, HENT, Cardiovascular, Respiratory, Psychiatric, Allergic/Imm, Heme/Lymph Last edited by Leonie Douglas, COA on 03/08/2021  8:43 AM.      ALLERGIES Allergies  Allergen Reactions   Gluten Meal Swelling   Lisinopril Cough   Pioglitazone Other (See Comments)    ELEVATED glucoses + worse chronic nausea    PAST MEDICAL HISTORY Past Medical History:  Diagnosis Date   Abdominal bloating    likely from diab gastroparesis.  Dr.  Havery Moros started trial of reglan 03/2019.   Benign brain tumor ([place])    Cystic lesion in cerebral aqueduct region with mild hydrocephalus-- stable MRI 02/2016.  Surveillance MRI 05/2017 --dilated cerebral aqueduct related to aqueductal stenosis and subsequent mild hydrocephalus (due to the 11 mm stable cystic lesion in cerebral aqueduct---?congenitial?.   Cataract    OU   Dysmenorrhea    vicodin occ during first 2 days of cycle.   Gluten intolerance    pt reports she underwent full GI w/u to r/o celiac dz   Hepatic steatosis    ultrasound 08/2017. Hx of very mild elevation of ALT.  Stable on u/s 06/2020   History of adenomatous polyp of colon 04/08/2019   recall Feb 2024   Hyperlipidemia, mixed    Hypertension    +white coat component   Hypertensive retinopathy of both eyes    Insomnia    Iron deficiency 01/2019   Hb 11.3. Hemoccults neg x 3 03/19/19. EGD and colonoscopy 04/08/19 showed NO cause for IDA.  Pt does have menorrhagia, though, so she'll see her GYN.  started FeSO4 325 qd approx 04/14/19.   Menorrhagia    resulting in IDA 2021   Proliferative diabetic retinopathy of both eyes ([place])    steroid injections 10/2017--improved   Sensorineural hearing loss of left ear    Sudden left hearing loss summer 2016--no improvement with steroids 01/2015 so brain MRI done by Dr. Redmond Baseman and it showed brain tumor that was determined to be benign.  Pt's hearing not bad enough for hearing aid as of 06/2016.   Type 2 diabetes with complication (HCC)    +microalbuminuria, diab retpthy, diabetic gastroparesis (gastric emptying study mildly abnl 03/2017).  Recommended lantus 08/2018 but pt declined. Mild microalbuminuria.   Uterine fibroid 2022   per pt report, pelvic u/s in GYN office   Past Surgical History:  Procedure Laterality Date   ANOSCOPY  05/12/2019   Procedure: normal exam, minimal hemorrhoid disease. Hyertrophied anal papila, benign appearing, posterior midline. Surgeon: Leighton Ruff MD    CHOLECYSTECTOMY  2000   COLONOSCOPY  04/08/2019   5 adenomas, recall 3 yrs; no cause for IDA found.  Hypertrophied anal papillae->bx showed low grade dysplasia; GI referred her to colorectal surgeon.   ESOPHAGOGASTRODUODENOSCOPY  04/08/2019   mild chronic reactive gastritis. H pylori NEG.  No cause for IDA found.   GAS INSERTION Right 10/31/2018   Procedure: Insertion Of C3F8 Gas;  Surgeon: Bernarda Caffey, MD;  Location: [place];  Service: Ophthalmology;  Laterality: Right;   GASTRIC EMPTYING SCAN  04/20/2017   Mildly abnormal, particularly the 1st hour of emptying.   MEMBRANE PEEL Right 10/31/2018   Procedure: MEMBRANE PEEL;  Surgeon: Bernarda Caffey, MD;  Location: [place];  Service: Ophthalmology;  Laterality: Right;   PARS PLANA VITRECTOMY Right 04/12/2018   Procedure: Right PARS PLANA VITRECTOMY WITH 25 GAUGE with  intravitreal antibiotics;  Surgeon: Bernarda Caffey, MD;  Location: [place];  Service: Ophthalmology;  Laterality: Right;   PARS PLANA VITRECTOMY Right 10/31/2018   Procedure: PARS PLANA VITRECTOMY WITH 25 GAUGE;  Surgeon: Bernarda Caffey, MD;  Location: [place];  Service: Ophthalmology;  Laterality: Right;   PHOTOCOAGULATION WITH LASER Right 10/31/2018   Procedure: Photocoagulation With Laser;  Surgeon: Bernarda Caffey, MD;  Location: [place];  Service: Ophthalmology;  Laterality: Right;   TRANSTHORACIC ECHOCARDIOGRAM  08/23/2020   Grd I DD, o/w normal.    FAMILY HISTORY Family History  Problem Relation Age of Onset   Brain cancer Mother    Diabetes Father    Diabetes Maternal Grandmother    Cataracts Maternal Grandmother    Cervical cancer Paternal Grandmother    Colon cancer Maternal Grandfather 42   Amblyopia Neg Hx    Blindness Neg Hx    Glaucoma Neg Hx    Macular degeneration Neg Hx    Retinal detachment Neg Hx    Strabismus Neg Hx    Retinitis pigmentosa Neg Hx    Esophageal cancer Neg Hx    Stomach cancer Neg Hx    Rectal cancer Neg Hx    SOCIAL HISTORY Social History    Tobacco Use   Smoking status: Never   Smokeless tobacco: Never  Vaping Use   Vaping Use: Never used  Substance Use Topics   Alcohol use: No   Drug use: No       OPHTHALMIC EXAM:  Base Eye Exam     Visual Acuity (Snellen - Linear)       Right Left   Dist [place] 20/30    Dist cc  20/30 +1   Dist ph [place] 20/25 -2    Dist ph cc  20/25 -2    Correction: Glasses         Tonometry (Tonopen, 8:52 AM)       Right Left   Pressure 14 13         Pupils       Dark Light Shape React APD   Right 3 2 Round Slow None   Left 3 2 Round Slow None         Visual Fields (Counting fingers)       Left Right    Full Full         Extraocular Movement       Right Left    Full Full         Neuro/Psych     Oriented x3: Yes   Mood/Affect: Normal         Dilation     Both eyes: 1.0% Mydriacyl, 2.5% Phenylephrine @ 8:53 AM           Slit Lamp and Fundus Exam     Slit Lamp Exam       Right Left   Lids/Lashes Dermatochalasis - upper lid, mild Meibomian gland dysfunction, Telangiectasia Dermatochalasis - upper lid, Meibomian gland dysfunction, Telangiectasia   Conjunctiva/Sclera White and quiet White and quiet   Cornea Clear, well healed temporal cataract wounds Trace Punctate epithelial erosions   Anterior Chamber Deep and quiet Deep and quiet   Iris Round and dilated, No NVI Round and dilated, No NVI   Lens PC IOL in good position, 1-2+ Posterior capsular opacification, PC folds 2-3+ Nuclear sclerosis, 2-3+ Cortical cataract   Anterior Vitreous post vitrectomy, trace pigment Vitreous syneresis, blood stained vitreous condensations setteled inferiorly and white -- no read heme  Fundus Exam       Right Left   Disc mild Pallor, Sharp rim, temporal PPA Pink and sharp, Compact, PPA   C/D Ratio 0.2 0.0   Macula Flat, good foveal reflex, mild cystic changes ST to fovea -- persistent, punctate exudate - improved, scattered MA ST mac -- improved, +focal  laser changes Flat, good foveal reflex, trace cystic changes, scattered MA; trace ERM, light focal laser changes   Vessels attenuated, Tortuous attenuated, mild Tortuousity   Periphery Attached, scattered DBH greatest superiorly, 360 PRP, good laser fill in 360 attached, scattered IRH; 360 PRP scars -- with good fill in changes           IMAGING AND PROCEDURES  Imaging and Procedures for _0 @  OCT, Retina - OU - Both Eyes       Right Eye Quality was good. Central Foveal Thickness: 369. Progression has worsened. Findings include abnormal foveal contour, epiretinal membrane, no SRF, intraretinal hyper-reflective material, intraretinal fluid (Persistent IRF -- slightly increased).   Left Eye Quality was good. Central Foveal Thickness: 277. Progression has been stable. Findings include normal foveal contour, no SRF, intraretinal hyper-reflective material, intraretinal fluid (non-central cystic changes -- stably improved greatest temporal macula).   Notes *Images captured and stored on drive  Diagnosis / Impression:  DME OU OD: Persistent IRF -- slightly increased OS: non-central cystic changes -- stably improved greatest temporal macula  Clinical management:  See below  Abbreviations: NFP - Normal foveal profile. CME - cystoid macular edema. PED - pigment epithelial detachment. IRF - intraretinal fluid. SRF - subretinal fluid. EZ - ellipsoid zone. ERM - epiretinal membrane. ORA - outer retinal atrophy. ORT - outer retinal tubulation. SRHM - subretinal hyper-reflective material       Intravitreal Injection, Pharmacologic Agent - OD - Right Eye       Time Out 03/08/2021. 9:28 AM. Confirmed correct patient, procedure, site, and patient consented.   Anesthesia Topical anesthesia was used. Anesthetic medications included Lidocaine 2%, Proparacaine 0.5%.   Procedure Preparation included 5% betadine to ocular surface, eyelid speculum. A (32g) needle was used.   Injection: 2  mg aflibercept 2 MG/0.05ML   Route: Intravitreal, Site: Right Eye   NDC: 03491-791-50, Lot: [id number], Expiration date: 03/30/2021, Waste: 0.05 mL   Post-op Post injection exam found visual acuity of at least counting fingers. The patient tolerated the procedure well. There were no complications. The patient received written and verbal post procedure care education. Post injection medications were not given.   Notes **SAMPLE MEDICATION ADMINISTERED**             ASSESSMENT/PLAN:   ICD-10-CM   1. Proliferative diabetic retinopathy of both eyes with macular edema associated with type 2 diabetes mellitus (HCC)  E11.3513 OCT, Retina - OU - Both Eyes    Intravitreal Injection, Pharmacologic Agent - OD - Right Eye    aflibercept (EYLEA) SOLN 2 mg    2. Vitreous hemorrhage of left eye (HCC)  H43.12     3. Right endophthalmia  H44.001     4. Vitreous hemorrhage, right eye ([place])  H43.11     5. Essential hypertension  I10     6. Hypertensive retinopathy of both eyes  H35.033     7. Combined forms of age-related cataract of left eye  H25.812     8. Pseudophakia of right eye  Z96.1      1.  Proliferative diabetic retinopathy w/ DME, OU  - HbA1c 8.6% (9.14.22), 7.8% (06.13.22), 9.1% (02.24.22)  11.0% (11.22.21)  - s/p IVA OD #1 9.20.19, #2 (10.25.19), #3 (11.15.19), #4 (12.17.19), #5 (01.14.20), #6 (2.11.20), #7 (05.29.20), #8 (08.19.20), #9 (10.30.20), #10 (12.09.20), #11 (01.11.21), #12 (02.15.21), #13 (03.23.21), #14 (04.20.21), #15 (06.08.21), #16 (07.06.21) -- IVA resistance  - s/p IVA OS #1 9.27.19, #2 (10.25.19), #3 (11.15.19), #4 (12.17.19), #5 (01.14.20), #6 (2.11.20), #7 (04.26.20), #8 (05.29.20), #9 (06.26.20), #10 (08.05.20), #11 (11.11.20), #12 (12.09.20), #13 (01.11.21), #14 (02.15.21), #15 (03.23.21), #16 (04.20.21), #17 (06.08.21) -- IVA resistance, #18 (09.27.21)  - s/p IVE OD #1 (08.03.21) -- sample, #2 (09.10.21), #3 (10.08.21), #4 (11.23.21), #5 (12.21.21), #6  (01.19.22) sample, #7 (2.16.22), #8 (3.22.22), #9 (04.19.22), #10 (05.27.22), #11 (06.29.22), #12 (08.03.22), #13 (09.07.22), #14 (10.10.22), #15 (11.14.22), #16 (12.12.22)  - s/p IVE OS #1 (10.25.21), #2 (11.23.21), #3 (12.21.21), #4 (3.22.22), #5 (06.29.22), #6 (08.03.22), #7 (09.07.22), #8 (10.10.22), #9 (11.14.22)  - S/P PRP OS (09.20.19), (5.19.20), (08.19.20), (04.28.21), (02.02.22)  - S/P PRP OD (9.27.19 and 11.21.19), fill-in (04.14.20) (09.03.20, surgery)  - S/P focal laser OS (07.06.21), OD (09.20.22)  - FA (9.20.19) shows +NVE OU and leaking MA and capillary nonperfusion  - repeat FA 11.15.19 shows NV regressing OU  - pre-op: OD w/ VA stable at 20/25, but there is some preretinal fibrosis / tractional membranes just superior to disc and mild central DME  - s/p 25g PPV+MP+10% C3F8 gas OD (09.03.20) -- ERM/PRF removal OD  - BCVA 20/25 OU             - fibrosis/ERM stably improved; retina attached  - OCT shows OD: Persistent IRF -- slightly increased; OS: non-central cystic changes -- stably improved greatest temporal macula  - recommend IVE OD (sample) #17 today, 01.10.22 -- [employer] prior auth pending  - will hold off on OS today  - pt wishes in agreement  - RBA of procedure discussed, questions answered - see procedure note  - Avastin informed consent form signed and scanned on 01.11.2021 (OU)  - Eylea informed consent form signed and scanned on 08.03.2021  - f/u 4 weeks, DFE, OCT, possible injection  2. Vitreous hemorrhage OS -- stably improved  - recurrent VH, onset 09.22.21  - etiology: secondary to PDR as described above (no RT/RD on exam)  - s/p PRP OS (9.20.19), (05.19.20), (08.19.20), (04.28.21), (02.02.22)  - s/p IVA OS on 4.26.20, 5.29.20, 6.26.20, 8.5.20,11.11.20, 12.06.20 and so on as above  - VH clear centrally and settled inferiorly -- now white  - BCVA stable at 20/25  - f/u 4 weeks DFE, OCT, possible injection  3. History of Endophthalmitis OD  - s/p IVA OU  04/09/2018  - s/p 25g PPV w/ intravitreal vanc, ceftaz and cefepime OD, 2.14.2020  - s/p intravitreal tap / vanc and ceflaz injections (02.16.20)             - gram stain (2.14.20) shows G+ cocci, WBCs mostly PMNs;   - repeat gram stain from t/i (2.16.20) -- no organisms, just WBCs             - cultures from vitreous grew rare Staph warneri; cultures from t/i -- no growth             - doing well, BCVA 20/25             - inflammation/posterior debris resolved             - IOP 14 off Brimonidine  - completed po pred taper -- caused significant elevations in BG  - monitor  4. History of Vitreous Hemorrhage OD -- cleared from PPV x2 for endophthalmitis and ERM/preretinal fibrosis  - secondary to PDR as described below  - S/P IVA OD #1 (09.20.19), #2 (10.25.19), #3 (11.15.19), #4 (12.16.19), #5 (03/12/2018)  - S/P PRP OD #1 (09.27.19), fill in OD (11.21.19) -- each somewhat limited inferiorly by residual VH  5,6. Hypertensive retinopathy OU  - BP elevated today (9.27.21) -- 164/91, left arm  - discussed importance of tight BP control.  - monitor  7. Combined form age related cataract OS-   - The symptoms of cataract, surgical options, and treatments and risks were discussed with patient.  - discussed diagnosis and progression  8. Pseudophakia OD  - s/p CE/IOL OD (Dr. Zenia Resides, 12.11.20)  - beautiful surgery, doing well  Ophthalmic Meds Ordered this visit:  Meds ordered this encounter  Medications   aflibercept (EYLEA) SOLN 2 mg     Return in about 4 weeks (around 04/05/2021) for f/u PDR OU, DFE, OCT.  There are no Patient Instructions on file for this visit.  This document serves as a record of services personally performed by Gardiner Sleeper, MD, PhD. It was created on their behalf by San Jetty. Owens Shark, OA an ophthalmic technician. The creation of this record is the provider's dictation and/or activities during the visit.    Electronically signed by: San Jetty. Owens Shark, [place]  01.10.2023 12:11 PM   Gardiner Sleeper, M.D., Ph.D. Diseases & Surgery of the Retina and Vitreous [facility]  I have reviewed the above documentation for accuracy and completeness, and I agree with the above. Gardiner Sleeper, M.D., Ph.D. 03/08/21 12:11 PM   Abbreviations: M myopia (nearsighted); A astigmatism; H hyperopia (farsighted); P presbyopia; Mrx spectacle prescription;  CTL contact lenses; OD right eye; OS left eye; OU both eyes  XT exotropia; ET esotropia; PEK punctate epithelial keratitis; PEE punctate epithelial erosions; DES dry eye syndrome; MGD meibomian gland dysfunction; ATs artificial tears; PFAT's preservative free artificial tears; [place] nuclear sclerotic cataract; PSC posterior subcapsular cataract; ERM epi-retinal membrane; PVD posterior vitreous detachment; RD retinal detachment; DM diabetes mellitus; DR diabetic retinopathy; NPDR non-proliferative diabetic retinopathy; PDR proliferative diabetic retinopathy; CSME clinically significant macular edema; DME diabetic macular edema; dbh dot blot hemorrhages; CWS cotton wool spot; POAG primary open angle glaucoma; C/D cup-to-disc ratio; HVF humphrey visual field; GVF goldmann visual field; OCT optical coherence tomography; IOP intraocular pressure; BRVO Branch retinal vein occlusion; CRVO central retinal vein occlusion; CRAO central retinal artery occlusion; BRAO branch retinal artery occlusion; RT retinal tear; SB scleral buckle; PPV pars plana vitrectomy; VH Vitreous hemorrhage; PRP panretinal laser photocoagulation; IVK intravitreal kenalog; VMT vitreomacular traction; MH Macular hole;  NVD neovascularization of the disc; NVE neovascularization elsewhere; AREDS age related eye disease study; ARMD age related macular degeneration; POAG primary open angle glaucoma; EBMD epithelial/anterior basement membrane dystrophy; ACIOL anterior chamber intraocular lens; IOL intraocular lens; PCIOL posterior chamber intraocular  lens; Phaco/IOL phacoemulsification with intraocular lens placement; [place] photorefractive keratectomy; LASIK laser assisted in situ keratomileusis; HTN hypertension; DM diabetes mellitus; COPD chronic obstructive pulmonary disease

## 2021-03-10 ENCOUNTER — Other Ambulatory Visit: Payer: Self-pay

## 2021-03-10 DIAGNOSIS — Z794 Long term (current) use of insulin: Secondary | ICD-10-CM | POA: Insufficient documentation

## 2021-03-10 DIAGNOSIS — E785 Hyperlipidemia, unspecified: Secondary | ICD-10-CM | POA: Insufficient documentation

## 2021-03-11 ENCOUNTER — Ambulatory Visit: Payer: 59 | Admitting: Family Medicine

## 2021-03-11 ENCOUNTER — Encounter: Payer: Self-pay | Admitting: Family Medicine

## 2021-03-11 VITALS — BP 114/72 | HR 92 | Temp 97.5°F | Ht 63.0 in | Wt 194.4 lb

## 2021-03-11 DIAGNOSIS — I10 Essential (primary) hypertension: Secondary | ICD-10-CM

## 2021-03-11 DIAGNOSIS — M353 Polymyalgia rheumatica: Secondary | ICD-10-CM | POA: Diagnosis not present

## 2021-03-11 DIAGNOSIS — K760 Fatty (change of) liver, not elsewhere classified: Secondary | ICD-10-CM

## 2021-03-11 DIAGNOSIS — E782 Mixed hyperlipidemia: Secondary | ICD-10-CM

## 2021-03-11 MED ORDER — LOSARTAN POTASSIUM 100 MG PO TABS
100.0000 mg | ORAL_TABLET | Freq: Every day | ORAL | 3 refills | Status: DC
Start: 2021-03-11 — End: 2022-05-02

## 2021-03-11 MED ORDER — VERAPAMIL HCL ER 240 MG PO TBCR: 240.0000 mg | EXTENDED_RELEASE_TABLET | Freq: Every day | ORAL | 3 refills | Status: DC

## 2021-03-11 NOTE — Addendum Note (Signed)
Addended by: Marchia Bond on: 03/11/2021 02:36 PM   Modules accepted: Orders

## 2021-03-11 NOTE — Progress Notes (Signed)
OFFICE VISIT  03/11/2021  CC: f/u HTN, HLD, pain  HPI:    Patient is a 51 y.o. female who presents accompanied by her husband Gloria Gomez for 3-month follow-up hypertension, hyperlipidemia, widespread musculoskeletal pain and fatigue. A/P as of last visit: "#1: Widespread musculoskeletal pain.  I do think she has polymyalgia rheumatica and likely also has fibromyalgia syndrome.  Improved on prednisone but we have to taper her off this due to the complications it is causing for her eyes.  We will plan prednisone 10 mg a day for 7 days, then 5 mg a day for 7 days, then stop. Will refer to rheumatology for confirmation of PMR diagnosis and consideration of treatments alternative to prednisone for her.  Rechecking sed rate today.  2.  Chronic bloating.  She has a known diagnosis of diabetic gastroparesis but it does not seem to have responded to use of Reglan.  Lab and imaging work-up unrevealing.  We will check celiac labs today.  We are following up to see if we can expedite her GI referral that we ordered 1 month ago.   #3.  Diabetes type 2 with multiple complications.  Most recent A1c was 8.6% one month ago.  She has been taking Victoza and metformin as well as Lantus at ever-increasing doses.  Insurance/financial constraints have prevented use of SGLT2 inhibitors.  Avoiding Actos due to her fluid retention/bloating. We have tried to refer her to endocrinology for the last couple of months but are not getting anywhere.  New referral to Dr. Buddy Duty at [place] ordered today."  INTERIM HX: Gloria Gomez is doing pretty well. Eyes have been better of late.  She is now established with endocrinologist, Dr. Madelin Rear with [facility]. Most recent office note from him dated 02/17/2021--hemoglobin A1c was 7.4% at that time.  Her Lantus was reduced to 24 units/day, prandial insulin was reduced to 6 to 8 units before every meal, Jardiance 10 mg daily started.  Victoza at the 1.8 mg  dose continued. Patient is pleased with how this is all going.  Blood pressure at that visit was 126/64, heart rate 87, weight 197.2 pounds Occasional home blood pressure check is normal.  Pain level is overall better on gabapentin 300 mg twice a day.  She occasionally takes an additional tab as needed increased pain.  It does make her little drowsy when she takes her third dose.  Main pain areas are shoulders, hips, and ankles.  She does not note any joint redness or swelling. Status of rheumatology referral: She has her initial appt next week.  PMP AWARE reviewed today:  Nothing is listed.   ROS as above, plus--> no fevers, no CP, no SOB, no wheezing, no cough, no dizziness, no HAs, no rashes, no melena/hematochezia.  No polyuria or polydipsia.    No focal weakness, paresthesias, or tremors.  No acute vision or hearing abnormalities.  No dysuria or unusual/new urinary urgency or frequency.   No n/v/d or abd pain.  No palpitations.     Past Medical History:  Diagnosis Date   Abdominal bloating    likely from diab gastroparesis.  Dr. Havery Moros started trial of reglan 03/2019.   Benign brain tumor ([place])    Cystic lesion in cerebral aqueduct region with mild hydrocephalus-- stable MRI 02/2016.  Surveillance MRI 05/2017 --dilated cerebral aqueduct related to aqueductal stenosis and subsequent mild hydrocephalus (due to the 11 mm stable cystic lesion in cerebral aqueduct---?congenitial?.   Cataract    OU  Dysmenorrhea    vicodin occ during first 2 days of cycle.   Gluten intolerance    pt reports she underwent full GI w/u to r/o celiac dz   Hepatic steatosis    ultrasound 08/2017. Hx of very mild elevation of ALT.  Stable on u/s 06/2020   History of adenomatous polyp of colon 04/08/2019   recall Feb 2024   Hyperlipidemia, mixed    Hypertension    +white coat component   Hypertensive retinopathy of both eyes    Insomnia    Iron deficiency 01/2019   Hb 11.3. Hemoccults neg x 3 03/19/19.  EGD and colonoscopy 04/08/19 showed NO cause for IDA.  Pt does have menorrhagia, though, so she'll see her GYN.  started FeSO4 325 qd approx 04/14/19.   Menorrhagia    resulting in IDA 2021   Proliferative diabetic retinopathy of both eyes ([place])    steroid injections 10/2017--improved   Sensorineural hearing loss of left ear    Sudden left hearing loss summer 2016--no improvement with steroids 01/2015 so brain MRI done by Dr. Redmond Baseman and it showed brain tumor that was determined to be benign.  Pt's hearing not bad enough for hearing aid as of 06/2016.   Type 2 diabetes with complication ([facility])    +microalbuminuria, diab retpthy, diabetic gastroparesis (gastric emptying study mildly abnl 03/2017).  Recommended lantus 08/2018 but pt declined. Mild microalbuminuria.   Uterine fibroid 2022   per pt report, pelvic u/s in GYN office    Past Surgical History:  Procedure Laterality Date   ANOSCOPY  05/12/2019   Procedure: normal exam, minimal hemorrhoid disease. Hyertrophied anal papila, benign appearing, posterior midline. Surgeon: Leighton Ruff MD   CHOLECYSTECTOMY  2000   COLONOSCOPY  04/08/2019   5 adenomas, recall 3 yrs; no cause for IDA found.  Hypertrophied anal papillae->bx showed low grade dysplasia; GI referred her to colorectal surgeon.   ESOPHAGOGASTRODUODENOSCOPY  04/08/2019   mild chronic reactive gastritis. H pylori NEG.  No cause for IDA found.   GAS INSERTION Right 10/31/2018   Procedure: Insertion Of C3F8 Gas;  Surgeon: Bernarda Caffey, MD;  Location: [place];  Service: Ophthalmology;  Laterality: Right;   GASTRIC EMPTYING SCAN  04/20/2017   Mildly abnormal, particularly the 1st hour of emptying.   MEMBRANE PEEL Right 10/31/2018   Procedure: MEMBRANE PEEL;  Surgeon: Bernarda Caffey, MD;  Location: [place];  Service: Ophthalmology;  Laterality: Right;   PARS PLANA VITRECTOMY Right 04/12/2018   Procedure: Right PARS PLANA VITRECTOMY WITH 25 GAUGE with intravitreal antibiotics;  Surgeon: Bernarda Caffey, MD;  Location: [place];  Service: Ophthalmology;  Laterality: Right;   PARS PLANA VITRECTOMY Right 10/31/2018   Procedure: PARS PLANA VITRECTOMY WITH 25 GAUGE;  Surgeon: Bernarda Caffey, MD;  Location: [place];  Service: Ophthalmology;  Laterality: Right;   PHOTOCOAGULATION WITH LASER Right 10/31/2018   Procedure: Photocoagulation With Laser;  Surgeon: Bernarda Caffey, MD;  Location: [place];  Service: Ophthalmology;  Laterality: Right;   TRANSTHORACIC ECHOCARDIOGRAM  08/23/2020   Grd I DD, o/w normal.    Outpatient Medications Prior to Visit  Medication Sig Dispense Refill   amLODipine (NORVASC) 5 MG tablet Take 1 tablet (5 mg total) by mouth daily. 90 tablet 3   empagliflozin (JARDIANCE) 10 MG TABS tablet Take 10 mg by mouth daily.     furosemide (LASIX) 20 MG tablet TAKE 2 TABLETS BY MOUTH IN THE MORNING AND 1 IN THE EVENING 90 tablet 2   gabapentin (NEURONTIN) 300  MG capsule Take 1 capsule (300 mg total) by mouth 3 (three) times daily. 90 capsule 3   HYDROcodone-acetaminophen (NORCO/VICODIN) 5-325 MG tablet Take 1-2 tablets by mouth every 6 (six) hours as needed for moderate pain. 40 tablet 0   insulin aspart (NOVOLOG) 100 UNIT/ML injection Inject into the skin 3 (three) times daily before meals. Per endocrinologist     LANTUS SOLOSTAR 100 UNIT/ML Solostar Pen INJECT 90 TO 100 UNITS SUBCUTANEOUSLY AT BEDTIME GRADUALLY TITRATING TO THIS DOSE TO GET [employer] FASTING GLUCOSE RANGE OF 100-110 (Patient taking differently: 24-30 Units. INJECT 90 TO 100 UNITS SUBCUTANEOUSLY AT BEDTIME GRADUALLY TITRATING TO THIS DOSE TO GET TARGET FASTING GLUCOSE RANGE OF 100-110) 15 mL 3   metFORMIN (GLUCOPHAGE) 1000 MG tablet Take 1 tablet (1,000 mg total) by mouth 2 (two) times daily with a meal. 180 tablet 3   metoCLOPramide (REGLAN) 5 MG tablet TAKE 1 TABLET BY MOUTH 4 TIMES DAILY BEFORE MEAL(S) AND AT BEDTIME 360 tablet 3   metoprolol succinate (TOPROL-XL) 100 MG 24 hr tablet Take 2 tablets by mouth once daily 180  tablet 3   Multiple Vitamin (MULTIVITAMIN WITH MINERALS) TABS tablet Take 1 tablet by mouth daily.     OVER THE COUNTER MEDICATION Take 2 capsules by mouth daily. Neu Remedy     pantoprazole (PROTONIX) 40 MG tablet TAKE 1 TABLET BY MOUTH ONCE DAILY . 90 tablet 3   promethazine (PHENERGAN) 12.5 MG tablet 1-2 tabs po q6h prn nausea 30 tablet 1   rosuvastatin (CRESTOR) 10 MG tablet Take 1 tablet (10 mg total) by mouth daily. 90 tablet 3   VICTOZA 18 MG/3ML SOPN INJECT 1.2MG  INTO THE SKIN DAILY 18 mL 0   losartan (COZAAR) 100 MG tablet Take 1 tablet by mouth once daily 90 tablet 0   verapamil (CALAN-SR) 240 MG CR tablet Take 1 tablet (240 mg total) by mouth at bedtime. 30 tablet 6   predniSONE (DELTASONE) 5 MG tablet 2 tabs po qd x 7d, then 1 tab po qd x 7d (Patient not taking: Reported on 02/07/2021) 21 tablet 0   No facility-administered medications prior to visit.    Allergies  Allergen Reactions   Gluten Meal Swelling   Lisinopril Cough   Pioglitazone Other (See Comments)    ELEVATED glucoses + worse chronic nausea    ROS As per HPI  PE: Vitals with BMI 03/11/2021 12/09/2020 11/10/2020  Height 5\' 3"  - 5\' 3"   Weight 194 lbs 6 oz 195 lbs 198 lbs 13 oz  BMI 34.44 46.50 35.46  Systolic 568 127 517  Diastolic 72 76 78  Pulse 92 88 87     Physical Exam  Gen: Alert, well appearing.  Patient is oriented to person, place, time, and situation. AFFECT: pleasant, lucid thought and speech. CV: RRR, no m/r/g.   LUNGS: CTA bilat, nonlabored resps, good aeration in all lung fields.  LABS:  Last CBC Lab Results  Component Value Date   WBC 11.3 (H) 07/12/2020   HGB 11.7 07/12/2020   HCT 35.7 07/12/2020   MCV 83.8 07/12/2020   MCH 27.5 07/12/2020   RDW 14.1 07/12/2020   PLT 473 (H) 07/12/2020   Lab Results  Component Value Date   IRON 44 (L) 07/12/2020   TIBC 447 07/12/2020   FERRITIN 26 07/12/2020   Lab Results  Component Value Date   GYFVCBSW96 759 16/38/4665   Last  metabolic panel Lab Results  Component Value Date   GLUCOSE 117 (H) 11/10/2020  NA 140 11/10/2020   K 4.0 11/10/2020   CL 102 11/10/2020   CO2 24 11/10/2020   BUN 15 11/10/2020   CREATININE 0.74 11/10/2020   GFRNONAA >60 10/31/2018   CALCIUM 9.6 11/10/2020   PROT 6.8 11/10/2020   ALBUMIN 3.9 11/10/2020   BILITOT 0.2 11/10/2020   ALKPHOS 83 11/10/2020   AST 72 (H) 11/10/2020   ALT 82 (H) 11/10/2020   ANIONGAP 12 10/31/2018   Last lipids Lab Results  Component Value Date   CHOL 141 04/22/2020   HDL 43.20 04/22/2020   LDLCALC 58 04/22/2020   LDLDIRECT 77.0 07/19/2016   TRIG 195.0 (H) 04/22/2020   CHOLHDL 3 04/22/2020   Last hemoglobin A1c Lab Results  Component Value Date   HGBA1C 8.6 (H) 11/10/2020   Last thyroid functions Lab Results  Component Value Date   TSH 4.24 11/10/2020   T3TOTAL 129 11/10/2020   IMPRESSION AND PLAN:  #1 hypertension, well controlled on amlodipine 5 mg a day, losartan 100/day, and Toprol-XL 200/day. She is also on verapamil SR 240 mg a day.  #2 hyperlipidemia.  Doing well on Crestor 10 mg a day. Her last LDL was 58--this was almost a year ago.  She is not fasting today. Plan on fasting lipid panel at next follow-up in 4 months. Hepatic panel today.  #3 fibromyalgia, with recent suspicion of polymyalgia rheumatica. She had responded some clinically with prednisone and her sed rate came down some. However, her eye doctor had recommended that she not be on prednisone so we stopped it. She has rheumatology evaluation next week for further expert evaluation and management. Continue gabapentin 300 mg 3 times daily, but we discussed possible gradual increase of this medication.  Therapeutic expectations and side effect profile of medication discussed today.  Patient's questions answered.  #4 diabetes type 2, multiple complications. She is doing significantly better managed by endocrinologist since November 2022. Med list updated today.  An  After Visit Summary was printed and given to the patient.  FOLLOW UP: Return in about 4 months (around 07/09/2021).  Signed:  Crissie Sickles, MD           03/11/2021

## 2021-03-12 LAB — COMPREHENSIVE METABOLIC PANEL
AG Ratio: 1.5 (calc) (ref 1.0–2.5)
ALT: 20 U/L (ref 6–29)
AST: 19 U/L (ref 10–35)
Albumin: 3.8 g/dL (ref 3.6–5.1)
Alkaline phosphatase (APISO): 76 U/L (ref 37–153)
BUN: 20 mg/dL (ref 7–25)
CO2: 26 mmol/L (ref 20–32)
Calcium: 9.2 mg/dL (ref 8.6–10.4)
Chloride: 106 mmol/L (ref 98–110)
Creat: 0.93 mg/dL (ref 0.50–1.03)
Globulin: 2.6 g/dL (calc) (ref 1.9–3.7)
Glucose, Bld: 92 mg/dL (ref 65–99)
Potassium: 4.7 mmol/L (ref 3.5–5.3)
Sodium: 143 mmol/L (ref 135–146)
Total Bilirubin: 0.1 mg/dL — ABNORMAL LOW (ref 0.2–1.2)
Total Protein: 6.4 g/dL (ref 6.1–8.1)

## 2021-03-12 LAB — SEDIMENTATION RATE: Sed Rate: 53 mm/h — ABNORMAL HIGH (ref 0–30)

## 2021-03-17 DIAGNOSIS — E669 Obesity, unspecified: Secondary | ICD-10-CM | POA: Diagnosis not present

## 2021-03-17 DIAGNOSIS — M130 Polyarthritis, unspecified: Secondary | ICD-10-CM | POA: Diagnosis not present

## 2021-03-17 DIAGNOSIS — M791 Myalgia, unspecified site: Secondary | ICD-10-CM | POA: Diagnosis not present

## 2021-03-17 DIAGNOSIS — Z6832 Body mass index (BMI) 32.0-32.9, adult: Secondary | ICD-10-CM | POA: Diagnosis not present

## 2021-03-17 LAB — CBC: RBC: 4.33 (ref 3.87–5.11)

## 2021-03-17 LAB — CBC AND DIFFERENTIAL
HCT: 35 — AB (ref 36–46)
Hemoglobin: 11.5 — AB (ref 12.0–16.0)
Neutrophils Absolute: 5.7
Platelets: 506 — AB (ref 150–399)
WBC: 8.8

## 2021-03-17 LAB — POCT ERYTHROCYTE SEDIMENTATION RATE, NON-AUTOMATED: Sed Rate: 77

## 2021-03-22 ENCOUNTER — Encounter: Payer: Self-pay | Admitting: Family Medicine

## 2021-03-28 DIAGNOSIS — M797 Fibromyalgia: Secondary | ICD-10-CM | POA: Diagnosis not present

## 2021-03-28 DIAGNOSIS — R700 Elevated erythrocyte sedimentation rate: Secondary | ICD-10-CM | POA: Diagnosis not present

## 2021-03-28 DIAGNOSIS — M353 Polymyalgia rheumatica: Secondary | ICD-10-CM | POA: Diagnosis not present

## 2021-03-28 DIAGNOSIS — E669 Obesity, unspecified: Secondary | ICD-10-CM | POA: Diagnosis not present

## 2021-03-28 DIAGNOSIS — Z7952 Long term (current) use of systemic steroids: Secondary | ICD-10-CM | POA: Diagnosis not present

## 2021-03-28 DIAGNOSIS — Z6832 Body mass index (BMI) 32.0-32.9, adult: Secondary | ICD-10-CM | POA: Diagnosis not present

## 2021-03-28 DIAGNOSIS — M130 Polyarthritis, unspecified: Secondary | ICD-10-CM | POA: Diagnosis not present

## 2021-04-02 ENCOUNTER — Other Ambulatory Visit: Payer: Self-pay | Admitting: Family Medicine

## 2021-04-04 NOTE — Progress Notes (Signed)
[facility] Note  04/05/2021     CHIEF COMPLAINT Patient presents for Retina Follow Up  HISTORY OF PRESENT ILLNESS: Gloria Gomez is a 51 y.o. female who presents to the clinic today for:  HPI     Retina Follow Up   Patient presents with  Diabetic Retinopathy.  In both eyes.  This started 4 weeks ago.  I, the attending physician,  performed the HPI with the patient and updated documentation appropriately.        Comments   Patient here for 4 weeks retina follow up for PDR OU. Patient states vision may be a little weak today than normal. Fell backwards in shower hit head hard. The last week been feeling a little less in vision. No eye pain.      Last edited by Bernarda Caffey, MD on 04/06/2021 12:12 AM.    Pt states she fell backwards on Saturday and hit her head pretty hard, she states she did not see any floaters afterwards, but she did go to bed and make sure she kept her head elevated, pt states she saw a rheumatologist since she was here last and was dx with PMR, she states she was started on 36m po Prednisone, she states it helped with the pain in her body, but made her blood sugar increase    Referring physician:   HISTORICAL INFORMATION:   Selected notes from the MEDICAL RECORD NUMBER Referred for DM exam   CURRENT MEDICATIONS: No current outpatient medications on file. (Ophthalmic Drugs)   No current facility-administered medications for this visit. (Ophthalmic Drugs)   Current Outpatient Medications (Other)  Medication Sig   amLODipine (NORVASC) 5 MG tablet Take 1 tablet (5 mg total) by mouth daily.   empagliflozin (JARDIANCE) 10 MG TABS tablet Take 10 mg by mouth daily.   furosemide (LASIX) 20 MG tablet TAKE 2 TABLETS BY MOUTH IN THE MORNING AND 1 IN THE EVENING   gabapentin (NEURONTIN) 300 MG capsule Take 1 capsule (300 mg total) by mouth 3 (three) times daily.   HYDROcodone-acetaminophen (NORCO/VICODIN) 5-325 MG tablet Take 1-2  tablets by mouth every 6 (six) hours as needed for moderate pain.   insulin aspart (NOVOLOG) 100 UNIT/ML injection Inject into the skin 3 (three) times daily before meals. Per endocrinologist   LANTUS SOLOSTAR 100 UNIT/ML Solostar Pen INJECT 90 TO 100 UNITS SUBCUTANEOUSLY AT BEDTIME GRADUALLY TITRATING TO THIS DOSE TO GET TARGET FASTING GLUCOSE RANGE OF 100-110 (Patient taking differently: 24-30 Units. INJECT 90 TO 100 UNITS SUBCUTANEOUSLY AT BEDTIME GRADUALLY TITRATING TO THIS DOSE TO GET TARGET FASTING GLUCOSE RANGE OF 100-110)   losartan (COZAAR) 100 MG tablet Take 1 tablet (100 mg total) by mouth daily.   metFORMIN (GLUCOPHAGE) 1000 MG tablet TAKE 1 TABLET BY MOUTH TWICE DAILY WITH MEALS   metoCLOPramide (REGLAN) 5 MG tablet TAKE 1 TABLET BY MOUTH 4 TIMES DAILY BEFORE MEAL(S) AND AT BEDTIME   metoprolol succinate (TOPROL-XL) 100 MG 24 hr tablet Take 2 tablets by mouth once daily   Multiple Vitamin (MULTIVITAMIN WITH MINERALS) TABS tablet Take 1 tablet by mouth daily.   OVER THE COUNTER MEDICATION Take 2 capsules by mouth daily. Neu Remedy   pantoprazole (PROTONIX) 40 MG tablet TAKE 1 TABLET BY MOUTH ONCE DAILY .   promethazine (PHENERGAN) 12.5 MG tablet 1-2 tabs po q6h prn nausea   rosuvastatin (CRESTOR) 10 MG tablet Take 1 tablet (10 mg total) by mouth daily.   verapamil (CALAN-SR) 240 MG  CR tablet Take 1 tablet (240 mg total) by mouth at bedtime.   VICTOZA 18 MG/3ML SOPN INJECT 1.2MG INTO THE SKIN DAILY   No current facility-administered medications for this visit. (Other)   REVIEW OF SYSTEMS: ROS   Positive for: Endocrine, Eyes Negative for: Constitutional, Gastrointestinal, Neurological, Skin, Genitourinary, Musculoskeletal, HENT, Cardiovascular, Respiratory, Psychiatric, Allergic/Imm, Heme/Lymph Last edited by Theodore Demark, COA on 04/05/2021  1:51 PM.     ALLERGIES Allergies  Allergen Reactions   Gluten Meal Swelling   Lisinopril Cough   Pioglitazone Other (See Comments)     ELEVATED glucoses + worse chronic nausea   PAST MEDICAL HISTORY Past Medical History:  Diagnosis Date   Abdominal bloating    likely from diab gastroparesis.  Dr. Havery Moros started trial of reglan 03/2019.   Benign brain tumor ([place])    Cystic lesion in cerebral aqueduct region with mild hydrocephalus-- stable MRI 02/2016.  Surveillance MRI 05/2017 --dilated cerebral aqueduct related to aqueductal stenosis and subsequent mild hydrocephalus (due to the 11 mm stable cystic lesion in cerebral aqueduct---?congenitial?.   Cataract    OU   Dysmenorrhea    vicodin occ during first 2 days of cycle.   Fibromyalgia    Gluten intolerance    pt reports she underwent full GI w/u to r/o celiac dz   Hepatic steatosis    ultrasound 08/2017. Hx of very mild elevation of ALT.  Stable on u/s 06/2020   History of adenomatous polyp of colon 04/08/2019   recall Feb 2024   Hyperlipidemia, mixed    Hypertension    +white coat component   Hypertensive retinopathy of both eyes    Insomnia    Iron deficiency 01/2019   Hb 11.3. Hemoccults neg x 3 03/19/19. EGD and colonoscopy 04/08/19 showed NO cause for IDA.  Pt does have menorrhagia, though, so she'll see her GYN.  started FeSO4 325 qd approx 04/14/19.   Menorrhagia    resulting in IDA 2021   PMR (polymyalgia rheumatica) ([place]) 2022   question of; hx of elevated ESR-->better with prednisone but prednisone was contraindicated d/t eye issues/hyperglycemia-->rheum started following her 02/2021.   Proliferative diabetic retinopathy of both eyes ([place])    steroid injections 10/2017--improved   Sensorineural hearing loss of left ear    Sudden left hearing loss summer 2016--no improvement with steroids 01/2015 so brain MRI done by Dr. Redmond Baseman and it showed brain tumor that was determined to be benign.  Pt's hearing not bad enough for hearing aid as of 06/2016.   Type 2 diabetes with complication (HCC)    +microalbuminuria, diab retpthy, diabetic gastroparesis (gastric emptying  study mildly abnl 03/2017).  Recommended lantus 08/2018 but pt declined. Mild microalbuminuria.   Uterine fibroid 2022   per pt report, pelvic u/s in GYN office   Past Surgical History:  Procedure Laterality Date   ANOSCOPY  05/12/2019   Procedure: normal exam, minimal hemorrhoid disease. Hyertrophied anal papila, benign appearing, posterior midline. Surgeon: Leighton Ruff MD   CHOLECYSTECTOMY  2000   COLONOSCOPY  04/08/2019   5 adenomas, recall 3 yrs; no cause for IDA found.  Hypertrophied anal papillae->bx showed low grade dysplasia; GI referred her to colorectal surgeon.   ESOPHAGOGASTRODUODENOSCOPY  04/08/2019   mild chronic reactive gastritis. H pylori NEG.  No cause for IDA found.   GAS INSERTION Right 10/31/2018   Procedure: Insertion Of C3F8 Gas;  Surgeon: Bernarda Caffey, MD;  Location: [place];  Service: Ophthalmology;  Laterality: Right;   GASTRIC EMPTYING  SCAN  04/20/2017   Mildly abnormal, particularly the 1st hour of emptying.   MEMBRANE PEEL Right 10/31/2018   Procedure: MEMBRANE PEEL;  Surgeon: Bernarda Caffey, MD;  Location: [place];  Service: Ophthalmology;  Laterality: Right;   PARS PLANA VITRECTOMY Right 04/12/2018   Procedure: Right PARS PLANA VITRECTOMY WITH 25 GAUGE with intravitreal antibiotics;  Surgeon: Bernarda Caffey, MD;  Location: [place];  Service: Ophthalmology;  Laterality: Right;   PARS PLANA VITRECTOMY Right 10/31/2018   Procedure: PARS PLANA VITRECTOMY WITH 25 GAUGE;  Surgeon: Bernarda Caffey, MD;  Location: [place];  Service: Ophthalmology;  Laterality: Right;   PHOTOCOAGULATION WITH LASER Right 10/31/2018   Procedure: Photocoagulation With Laser;  Surgeon: Bernarda Caffey, MD;  Location: [place];  Service: Ophthalmology;  Laterality: Right;   TRANSTHORACIC ECHOCARDIOGRAM  08/23/2020   Grd I DD, o/w normal.   FAMILY HISTORY Family History  Problem Relation Age of Onset   Brain cancer Mother    Diabetes Father    Diabetes Maternal Grandmother    Cataracts Maternal  Grandmother    Cervical cancer Paternal Grandmother    Colon cancer Maternal Grandfather 5   Amblyopia Neg Hx    Blindness Neg Hx    Glaucoma Neg Hx    Macular degeneration Neg Hx    Retinal detachment Neg Hx    Strabismus Neg Hx    Retinitis pigmentosa Neg Hx    Esophageal cancer Neg Hx    Stomach cancer Neg Hx    Rectal cancer Neg Hx    SOCIAL HISTORY Social History   Tobacco Use   Smoking status: Never   Smokeless tobacco: Never  Vaping Use   Vaping Use: Never used  Substance Use Topics   Alcohol use: No   Drug use: No       OPHTHALMIC EXAM:  Base Eye Exam     Visual Acuity (Snellen - Linear)       Right Left   Dist [place] 20/30 +1    Dist cc  20/30 +1   Dist ph [place] 20/25 -1    Dist ph cc  20/25 -1         Tonometry (Tonopen, 1:47 PM)       Right Left   Pressure 17 13         Pupils       Dark Light Shape React APD   Right 3 2 Round Slow None   Left 3 2 Round Slow None         Visual Fields (Counting fingers)       Left Right    Full Full         Extraocular Movement       Right Left    Full, Ortho Full, Ortho         Neuro/Psych     Oriented x3: Yes   Mood/Affect: Normal         Dilation     Both eyes: 1.0% Mydriacyl, 2.5% Phenylephrine @ 1:47 PM           Slit Lamp and Fundus Exam     Slit Lamp Exam       Right Left   Lids/Lashes Dermatochalasis - upper lid, mild Meibomian gland dysfunction, Telangiectasia Dermatochalasis - upper lid, Meibomian gland dysfunction, Telangiectasia   Conjunctiva/Sclera White and quiet White and quiet   Cornea Clear, well healed temporal cataract wounds Trace Punctate epithelial erosions   Anterior Chamber Deep and quiet Deep and quiet  Iris Round and dilated, No NVI Round and dilated, No NVI   Lens PC IOL in good position, 1-2+ Posterior capsular opacification, PC folds 2-3+ Nuclear sclerosis, 2-3+ Cortical cataract   Anterior Vitreous post vitrectomy, trace pigment Vitreous  syneresis, blood stained vitreous condensations setteled inferiorly and white -- no red heme         Fundus Exam       Right Left   Disc mild Pallor, Sharp rim, temporal PPA Pink and sharp, Compact, PPA   C/D Ratio 0.2 0.0   Macula Flat, good foveal reflex, mild cystic changes ST to fovea -- persistent, punctate exudate - improved, scattered MA ST mac -- improved, +focal laser changes Flat, good foveal reflex, trace cystic changes, scattered MA; trace ERM, light focal laser changes   Vessels attenuated, Tortuous attenuated, mild Tortuousity   Periphery Attached, scattered DBH greatest superiorly - improved, 360 PRP, good laser fill in 360 attached, scattered IRH; 360 PRP scars -- with good fill in changes           Refraction     Wearing Rx       Sphere Cylinder   Right -3.75 Sphere   Left -3.75 Sphere           IMAGING AND PROCEDURES  Imaging and Procedures for _0 @  OCT, Retina - OU - Both Eyes       Right Eye Quality was good. Central Foveal Thickness: 339. Progression has improved. Findings include abnormal foveal contour, epiretinal membrane, no SRF, intraretinal hyper-reflective material, intraretinal fluid (Interval improvement in IRF ).   Left Eye Quality was good. Central Foveal Thickness: 295. Progression has worsened. Findings include normal foveal contour, no SRF, intraretinal hyper-reflective material, intraretinal fluid (Interval re-development of IRF/cystic changes temporal fovea).   Notes *Images captured and stored on drive  Diagnosis / Impression:  DME OU OD: Persistent IRF -- Interval improvement in IRF  OS: Interval re-development of IRF/cystic changes temporal fovea  Clinical management:  See below  Abbreviations: NFP - Normal foveal profile. CME - cystoid macular edema. PED - pigment epithelial detachment. IRF - intraretinal fluid. SRF - subretinal fluid. EZ - ellipsoid zone. ERM - epiretinal membrane. ORA - outer retinal atrophy. ORT -  outer retinal tubulation. SRHM - subretinal hyper-reflective material       Intravitreal Injection, Pharmacologic Agent - OD - Right Eye       Time Out 04/05/2021. 2:10 PM. Confirmed correct patient, procedure, site, and patient consented.   Anesthesia Topical anesthesia was used. Anesthetic medications included Lidocaine 2%, Proparacaine 0.5%.   Procedure Preparation included 5% betadine to ocular surface, eyelid speculum. A (32g) needle was used.   Injection: 2 mg aflibercept 2 MG/0.05ML   Route: Intravitreal, Site: Right Eye   NDC: [id number], Lot: [id number], Expiration date: 02/26/2022, Waste: 0.05 mL   Post-op Post injection exam found visual acuity of at least counting fingers. The patient tolerated the procedure well. There were no complications. The patient received written and verbal post procedure care education. Post injection medications were not given.      Intravitreal Injection, Pharmacologic Agent - OS - Left Eye       Time Out 04/05/2021. 2:11 PM. Confirmed correct patient, procedure, site, and patient consented.   Anesthesia Topical anesthesia was used. Anesthetic medications included Lidocaine 2%, Proparacaine 0.5%.   Procedure Preparation included 5% betadine to ocular surface, eyelid speculum. A (32g) needle was used.   Injection: 2 mg aflibercept 2 MG/0.05ML   Route: Intravitreal,  Site: Left Eye   [place]: [id number], Lot: [id number], Expiration date: 06/26/2021, Waste: 0.05 mL   Post-op Post injection exam found visual acuity of at least counting fingers. The patient tolerated the procedure well. There were no complications. The patient received written and verbal post procedure care education. Post injection medications were not given.            ASSESSMENT/PLAN:   ICD-10-CM   1. Proliferative diabetic retinopathy of both eyes with macular edema associated with type 2 diabetes mellitus (HCC)  E11.3513 OCT, Retina - OU - Both Eyes     Intravitreal Injection, Pharmacologic Agent - OD - Right Eye    Intravitreal Injection, Pharmacologic Agent - OS - Left Eye    aflibercept (EYLEA) SOLN 2 mg    aflibercept (EYLEA) SOLN 2 mg    2. Vitreous hemorrhage of left eye (HCC)  H43.12     3. Right endophthalmia  H44.001     4. Vitreous hemorrhage, right eye ([place])  H43.11     5. Essential hypertension  I10     6. Hypertensive retinopathy of both eyes  H35.033     7. Combined forms of age-related cataract of left eye  H25.812     8. Pseudophakia of right eye  Z96.1      1.  Proliferative diabetic retinopathy w/ DME, OU  - HbA1c 8.6% (9.14.22), 7.8% (06.13.22), 9.1% (02.24.22) 11.0% (11.22.21)  - s/p IVA OD #1 9.20.19, #2 (10.25.19), #3 (11.15.19), #4 (12.17.19), #5 (01.14.20), #6 (2.11.20), #7 (05.29.20), #8 (08.19.20), #9 (10.30.20), #10 (12.09.20), #11 (01.11.21), #12 (02.15.21), #13 (03.23.21), #14 (04.20.21), #15 (06.08.21), #16 (07.06.21) -- IVA resistance  - s/p IVA OS #1 9.27.19, #2 (10.25.19), #3 (11.15.19), #4 (12.17.19), #5 (01.14.20), #6 (2.11.20), #7 (04.26.20), #8 (05.29.20), #9 (06.26.20), #10 (08.05.20), #11 (11.11.20), #12 (12.09.20), #13 (01.11.21), #14 (02.15.21), #15 (03.23.21), #16 (04.20.21), #17 (06.08.21) -- IVA resistance, #18 (09.27.21)  - s/p IVE OD #1 (08.03.21) -- sample, #2 (09.10.21), #3 (10.08.21), #4 (11.23.21), #5 (12.21.21), #6 (01.19.22) sample, #7 (2.16.22), #8 (3.22.22), #9 (04.19.22), #10 (05.27.22), #11 (06.29.22), #12 (08.03.22), #13 (09.07.22), #14 (10.10.22), #15 (11.14.22), #16 (12.12.22), #17 (01.10.23) - sample  - s/p IVE OS #1 (10.25.21), #2 (11.23.21), #3 (12.21.21), #4 (3.22.22), #5 (06.29.22), #6 (08.03.22), #7 (09.07.22), #8 (10.10.22), #9 (11.14.22)  - S/P PRP OS (09.20.19), (5.19.20), (08.19.20), (04.28.21), (02.02.22)  - S/P PRP OD (9.27.19 and 11.21.19), fill-in (04.14.20) (09.03.20, surgery)  - S/P focal laser OS (07.06.21), OD (09.20.22)  - FA (9.20.19) shows +NVE OU and  leaking MA and capillary nonperfusion  - repeat FA 11.15.19 shows NV regressing OU  - pre-op: OD w/ VA stable at 20/25, but there is some preretinal fibrosis / tractional membranes just superior to disc and mild central DME  - s/p 25g PPV+MP+10% C3F8 gas OD (09.03.20) -- ERM/PRF removal OD  - BCVA 20/25 OU             - fibrosis/ERM stably improved; retina attached  - OCT shows OD: Persistent IRF -- Interval improvement in IRF; OS: interval re-development on IRF/cystic changes temporal fovea  - recommend IVE OU (OD #17 and OS #10) today, 02.07.23 -- has [id number]  - RBA of procedure discussed, questions answered - see procedure note  - Avastin informed consent form signed and scanned on 01.11.2021 (OU)  - Eylea informed consent form signed and scanned on 08.03.2021  - f/u 5-6 weeks, DFE, OCT, possible injection  2. Vitreous hemorrhage OS -- stably improved  - recurrent  VH, onset 09.22.21  - etiology: secondary to PDR as described above (no RT/RD on exam)  - s/p PRP OS (9.20.19), (05.19.20), (08.19.20), (04.28.21), (02.02.22)  - s/p IVA OS on 4.26.20, 5.29.20, 6.26.20, 8.5.20,11.11.20, 12.06.20 and so on as above  - VH clear centrally and settled inferiorly -- now white  - BCVA stable at 20/25  - f/u 5-6 weeks DFE, OCT, possible injection  3. History of Endophthalmitis OD  - s/p IVA OU 04/09/2018  - s/p 25g PPV w/ intravitreal vanc, ceftaz and cefepime OD, 2.14.2020  - s/p intravitreal tap / vanc and ceflaz injections (02.16.20)             - gram stain (2.14.20) shows G+ cocci, WBCs mostly PMNs;   - repeat gram stain from t/i (2.16.20) -- no organisms, just WBCs             - cultures from vitreous grew rare Staph warneri; cultures from t/i -- no growth             - doing well, BCVA 20/25             - inflammation/posterior debris resolved             - IOP 17 off Brimonidine  - completed po pred taper -- caused significant elevations in BG  - monitor  4. History of Vitreous  Hemorrhage OD -- cleared from PPV x2 for endophthalmitis and ERM/preretinal fibrosis  - secondary to PDR as described below  - S/P IVA OD #1 (09.20.19), #2 (10.25.19), #3 (11.15.19), #4 (12.16.19), #5 (03/12/2018)  - S/P PRP OD #1 (09.27.19), fill in OD (11.21.19) -- each somewhat limited inferiorly by residual VH  5,6. Hypertensive retinopathy OU  - BP elevated today (9.27.21) -- 164/91, left arm  - discussed importance of tight BP control.  - monitor  7. Combined form age related cataract OS-   - The symptoms of cataract, surgical options, and treatments and risks were discussed with patient.  - discussed diagnosis and progression  8. Pseudophakia OD  - s/p CE/IOL OD (Dr. Zenia Resides, 12.11.20)  - beautiful surgery, doing well  Ophthalmic Meds Ordered this visit:  Meds ordered this encounter  Medications   aflibercept (EYLEA) SOLN 2 mg   aflibercept (EYLEA) SOLN 2 mg     Return for f/u 5-6 weeks, PDR OU, DFE, OCT.  There are no Patient Instructions on file for this visit.  This document serves as a record of services personally performed by Gardiner Sleeper, MD, PhD. It was created on their behalf by San Jetty. Owens Shark, OA an ophthalmic technician. The creation of this record is the provider's dictation and/or activities during the visit.    Electronically signed by: San Jetty. Owens Shark, [place] 02.06.2023 12:18 AM  Gardiner Sleeper, M.D., Ph.D. Diseases & Surgery of the Retina and Vitreous [facility]  I have reviewed the above documentation for accuracy and completeness, and I agree with the above. Gardiner Sleeper, M.D., Ph.D. 04/06/21 12:23 AM   Abbreviations: M myopia (nearsighted); A astigmatism; H hyperopia (farsighted); P presbyopia; Mrx spectacle prescription;  CTL contact lenses; OD right eye; OS left eye; OU both eyes  XT exotropia; ET esotropia; PEK punctate epithelial keratitis; PEE punctate epithelial erosions; DES dry eye syndrome; MGD meibomian gland  dysfunction; ATs artificial tears; PFAT's preservative free artificial tears; [place] nuclear sclerotic cataract; PSC posterior subcapsular cataract; ERM epi-retinal membrane; PVD posterior vitreous detachment; RD retinal detachment; DM diabetes  mellitus; DR diabetic retinopathy; NPDR non-proliferative diabetic retinopathy; PDR proliferative diabetic retinopathy; CSME clinically significant macular edema; DME diabetic macular edema; dbh dot blot hemorrhages; CWS cotton wool spot; POAG primary open angle glaucoma; C/D cup-to-disc ratio; HVF humphrey visual field; GVF goldmann visual field; OCT optical coherence tomography; IOP intraocular pressure; BRVO Branch retinal vein occlusion; CRVO central retinal vein occlusion; CRAO central retinal artery occlusion; BRAO branch retinal artery occlusion; RT retinal tear; SB scleral buckle; PPV pars plana vitrectomy; VH Vitreous hemorrhage; PRP panretinal laser photocoagulation; IVK intravitreal kenalog; VMT vitreomacular traction; MH Macular hole;  NVD neovascularization of the disc; NVE neovascularization elsewhere; AREDS age related eye disease study; ARMD age related macular degeneration; POAG primary open angle glaucoma; EBMD epithelial/anterior basement membrane dystrophy; ACIOL anterior chamber intraocular lens; IOL intraocular lens; PCIOL posterior chamber intraocular lens; Phaco/IOL phacoemulsification with intraocular lens placement; [place] photorefractive keratectomy; LASIK laser assisted in situ keratomileusis; HTN hypertension; DM diabetes mellitus; COPD chronic obstructive pulmonary disease

## 2021-04-05 ENCOUNTER — Encounter (INDEPENDENT_AMBULATORY_CARE_PROVIDER_SITE_OTHER): Payer: Self-pay | Admitting: Ophthalmology

## 2021-04-05 ENCOUNTER — Other Ambulatory Visit: Payer: Self-pay

## 2021-04-05 ENCOUNTER — Ambulatory Visit (INDEPENDENT_AMBULATORY_CARE_PROVIDER_SITE_OTHER): Payer: 59 | Admitting: Ophthalmology

## 2021-04-05 DIAGNOSIS — H25812 Combined forms of age-related cataract, left eye: Secondary | ICD-10-CM | POA: Diagnosis not present

## 2021-04-05 DIAGNOSIS — I10 Essential (primary) hypertension: Secondary | ICD-10-CM

## 2021-04-05 DIAGNOSIS — H4312 Vitreous hemorrhage, left eye: Secondary | ICD-10-CM

## 2021-04-05 DIAGNOSIS — Z961 Presence of intraocular lens: Secondary | ICD-10-CM | POA: Diagnosis not present

## 2021-04-05 DIAGNOSIS — H4311 Vitreous hemorrhage, right eye: Secondary | ICD-10-CM

## 2021-04-05 DIAGNOSIS — H44001 Unspecified purulent endophthalmitis, right eye: Secondary | ICD-10-CM

## 2021-04-05 DIAGNOSIS — H35033 Hypertensive retinopathy, bilateral: Secondary | ICD-10-CM | POA: Diagnosis not present

## 2021-04-05 DIAGNOSIS — H4313 Vitreous hemorrhage, bilateral: Secondary | ICD-10-CM | POA: Diagnosis not present

## 2021-04-05 DIAGNOSIS — E113513 Type 2 diabetes mellitus with proliferative diabetic retinopathy with macular edema, bilateral: Secondary | ICD-10-CM | POA: Diagnosis not present

## 2021-04-06 ENCOUNTER — Encounter (INDEPENDENT_AMBULATORY_CARE_PROVIDER_SITE_OTHER): Payer: Self-pay | Admitting: Ophthalmology

## 2021-04-06 DIAGNOSIS — E113513 Type 2 diabetes mellitus with proliferative diabetic retinopathy with macular edema, bilateral: Secondary | ICD-10-CM | POA: Diagnosis not present

## 2021-04-06 MED ORDER — AFLIBERCEPT 2MG/0.05ML IZ SOLN FOR KALEIDOSCOPE
2.0000 mg | INTRAVITREAL | Status: AC | PRN
  Administered 2021-04-06: 2 mg via INTRAVITREAL

## 2021-05-07 ENCOUNTER — Other Ambulatory Visit: Payer: Self-pay | Admitting: Family Medicine

## 2021-05-10 DIAGNOSIS — E669 Obesity, unspecified: Secondary | ICD-10-CM | POA: Diagnosis not present

## 2021-05-10 DIAGNOSIS — M797 Fibromyalgia: Secondary | ICD-10-CM | POA: Diagnosis not present

## 2021-05-10 DIAGNOSIS — Z6832 Body mass index (BMI) 32.0-32.9, adult: Secondary | ICD-10-CM | POA: Diagnosis not present

## 2021-05-10 DIAGNOSIS — M353 Polymyalgia rheumatica: Secondary | ICD-10-CM | POA: Diagnosis not present

## 2021-05-10 DIAGNOSIS — R700 Elevated erythrocyte sedimentation rate: Secondary | ICD-10-CM | POA: Diagnosis not present

## 2021-05-13 NOTE — Progress Notes (Signed)
?[facility] Note ? ?05/17/2021 ? ?  ? ?CHIEF COMPLAINT ?Patient presents for Retina Follow Up ? ?HISTORY OF PRESENT ILLNESS: ?Gloria Gomez is a 51 y.o. female who presents to the clinic today for:  ?HPI   ? ? Retina Follow Up   ?Patient presents with  Diabetic Retinopathy.  In both eyes.  Severity is moderate.  Duration of 6 weeks.  Since onset it is gradually worsening.  I, the attending physician,  performed the HPI with the patient and updated documentation appropriately. ? ?  ?  ? ? Comments   ?6 week Retina follow up for Pdr ou. Patient states vision in the right eye seems worse. Patient blood sugar 125 ? ?  ?  ?Last edited by Bernarda Caffey, MD on 05/18/2021 12:17 AM.  ?  ? ?Pt states vision has decreased OD, pt states she saw her rheumatologist last week and was given a rx for plaquenil, she states she has not started it yet bc she wanted to talk to Dr. Coralyn Pear first ?  ? ?Referring physician: ? ? ?HISTORICAL INFORMATION:  ? ?Selected notes from the [facility] ?Referred for DM exam  ? ?CURRENT MEDICATIONS: ?No current outpatient medications on file. (Ophthalmic Drugs)  ? ?No current facility-administered medications for this visit. (Ophthalmic Drugs)  ? ?Current Outpatient Medications (Other)  ?Medication Sig  ? amLODipine (NORVASC) 5 MG tablet Take 1 tablet (5 mg total) by mouth daily.  ? empagliflozin (JARDIANCE) 10 MG TABS tablet Take 10 mg by mouth daily.  ? furosemide (LASIX) 20 MG tablet TAKE 2 TABLETS BY MOUTH IN THE MORNING AND 1 IN THE EVENING  ? gabapentin (NEURONTIN) 300 MG capsule TAKE 1 CAPSULE BY MOUTH THREE TIMES DAILY  ? HYDROcodone-acetaminophen (NORCO/VICODIN) 5-325 MG tablet Take 1-2 tablets by mouth every 6 (six) hours as needed for moderate pain.  ? insulin aspart (NOVOLOG) 100 UNIT/ML injection Inject into the skin 3 (three) times daily before meals. Per endocrinologist  ? LANTUS SOLOSTAR 100 UNIT/ML Solostar Pen INJECT 90 TO 100 UNITS  SUBCUTANEOUSLY AT BEDTIME GRADUALLY TITRATING TO THIS DOSE TO GET TARGET FASTING GLUCOSE RANGE OF 100-110 (Patient taking differently: 24-30 Units. INJECT 90 TO 100 UNITS SUBCUTANEOUSLY AT BEDTIME GRADUALLY TITRATING TO THIS DOSE TO GET TARGET FASTING GLUCOSE RANGE OF 100-110)  ? losartan (COZAAR) 100 MG tablet Take 1 tablet (100 mg total) by mouth daily.  ? metFORMIN (GLUCOPHAGE) 1000 MG tablet TAKE 1 TABLET BY MOUTH TWICE DAILY WITH MEALS  ? metoCLOPramide (REGLAN) 5 MG tablet TAKE 1 TABLET BY MOUTH 4 TIMES DAILY BEFORE MEAL(S) AND AT BEDTIME  ? metoprolol succinate (TOPROL-XL) 100 MG 24 hr tablet Take 2 tablets by mouth once daily  ? Multiple Vitamin (MULTIVITAMIN WITH MINERALS) TABS tablet Take 1 tablet by mouth daily.  ? OVER THE COUNTER MEDICATION Take 2 capsules by mouth daily. Neu Remedy  ? pantoprazole (PROTONIX) 40 MG tablet TAKE 1 TABLET BY MOUTH ONCE DAILY .  ? promethazine (PHENERGAN) 12.5 MG tablet 1-2 tabs po q6h prn nausea  ? rosuvastatin (CRESTOR) 10 MG tablet Take 1 tablet (10 mg total) by mouth daily.  ? verapamil (CALAN-SR) 240 MG CR tablet Take 1 tablet (240 mg total) by mouth at bedtime.  ? VICTOZA 18 MG/3ML SOPN INJECT 1.2MG INTO THE SKIN DAILY  ? ?No current facility-administered medications for this visit. (Other)  ? ?REVIEW OF SYSTEMS: ?ROS   ?Positive for: Endocrine, Eyes ?Negative for: Constitutional, Gastrointestinal, Neurological, Skin, Genitourinary,  Musculoskeletal, HENT, Cardiovascular, Respiratory, Psychiatric, Allergic/Imm, Heme/Lymph ?Last edited by Elmore Guise, COT on 05/17/2021  1:13 PM.  ?  ? ? ?ALLERGIES ?Allergies  ?Allergen Reactions  ? Gluten Meal Swelling  ? Lisinopril Cough  ? Pioglitazone Other (See Comments)  ?  ELEVATED glucoses + worse chronic nausea  ? ?PAST MEDICAL HISTORY ?Past Medical History:  ?Diagnosis Date  ? Abdominal bloating   ? likely from diab gastroparesis.  Dr. Havery Moros started trial of reglan 03/2019.  ? Benign brain tumor ([place])   ? Cystic lesion in  cerebral aqueduct region with mild hydrocephalus-- stable MRI 02/2016.  Surveillance MRI 05/2017 --dilated cerebral aqueduct related to aqueductal stenosis and subsequent mild hydrocephalus (due to the 11 mm stable cystic lesion in cerebral aqueduct---?congenitial?.  ? Cataract   ? OU  ? Dysmenorrhea   ? vicodin occ during first 2 days of cycle.  ? Fibromyalgia   ? Gluten intolerance   ? pt reports she underwent full GI w/u to r/o celiac dz  ? Hepatic steatosis   ? ultrasound 08/2017. Hx of very mild elevation of ALT.  Stable on u/s 06/2020  ? History of adenomatous polyp of colon 04/08/2019  ? recall Feb 2024  ? Hyperlipidemia, mixed   ? Hypertension   ? +white coat component  ? Hypertensive retinopathy of both eyes   ? Insomnia   ? Iron deficiency 01/2019  ? Hb 11.3. Hemoccults neg x 3 03/19/19. EGD and colonoscopy 04/08/19 showed NO cause for IDA.  Pt does have menorrhagia, though, so she'll see her GYN.  started FeSO4 325 qd approx 04/14/19.  ? Menorrhagia   ? resulting in [place] 2021  ? PMR (polymyalgia rheumatica) ([place]) 2022  ? question of; hx of elevated ESR-->better with prednisone but prednisone was contraindicated d/t eye issues/hyperglycemia-->rheum started following her 02/2021.  ? Proliferative diabetic retinopathy of both eyes ([place])   ? steroid injections 10/2017--improved  ? Sensorineural hearing loss of left ear   ? Sudden left hearing loss summer 2016--no improvement with steroids 01/2015 so brain MRI done by Dr. Redmond Baseman and it showed brain tumor that was determined to be benign.  Pt's hearing not bad enough for hearing aid as of 06/2016.  ? Type 2 diabetes with complication ([place])   ? +microalbuminuria, diab retpthy, diabetic gastroparesis (gastric emptying study mildly abnl 03/2017).  Recommended lantus 08/2018 but pt declined. Mild microalbuminuria.  ? Uterine fibroid 2022  ? per pt report, pelvic u/s in GYN office  ? ?Past Surgical History:  ?Procedure Laterality Date  ? ANOSCOPY  05/12/2019  ? Procedure: normal  exam, minimal hemorrhoid disease. Hyertrophied anal papila, benign appearing, posterior midline. Surgeon: Leighton Ruff MD  ? CHOLECYSTECTOMY  2000  ? COLONOSCOPY  04/08/2019  ? 5 adenomas, recall 3 yrs; no cause for IDA found.  Hypertrophied anal papillae->bx showed low grade dysplasia; GI referred her to colorectal surgeon.  ? ESOPHAGOGASTRODUODENOSCOPY  04/08/2019  ? mild chronic reactive gastritis. H pylori NEG.  No cause for IDA found.  ? GAS INSERTION Right 10/31/2018  ? Procedure: Insertion Of C3F8 Gas;  Surgeon: Bernarda Caffey, MD;  Location: [facility];  Service: Ophthalmology;  Laterality: Right;  ? GASTRIC EMPTYING SCAN  04/20/2017  ? Mildly abnormal, particularly the 1st hour of emptying.  ? MEMBRANE PEEL Right 10/31/2018  ? Procedure: MEMBRANE PEEL;  Surgeon: Bernarda Caffey, MD;  Location: [facility];  Service: Ophthalmology;  Laterality: Right;  ? PARS PLANA VITRECTOMY Right 04/12/2018  ? Procedure: Right PARS PLANA  VITRECTOMY WITH 25 GAUGE with intravitreal antibiotics;  Surgeon: Bernarda Caffey, MD;  Location: [facility];  Service: Ophthalmology;  Laterality: Right;  ? PARS PLANA VITRECTOMY Right 10/31/2018  ? Procedure: PARS PLANA VITRECTOMY WITH 25 GAUGE;  Surgeon: Bernarda Caffey, MD;  Location: [facility];  Service: Ophthalmology;  Laterality: Right;  ? PHOTOCOAGULATION WITH LASER Right 10/31/2018  ? Procedure: Photocoagulation With Laser;  Surgeon: Bernarda Caffey, MD;  Location: [facility];  Service: Ophthalmology;  Laterality: Right;  ? TRANSTHORACIC ECHOCARDIOGRAM  08/23/2020  ? Grd I DD, o/w normal.  ? ?FAMILY HISTORY ?Family History  ?Problem Relation Age of Onset  ? Brain cancer Mother   ? Diabetes Father   ? Diabetes Maternal Grandmother   ? Cataracts Maternal Grandmother   ? Cervical cancer Paternal Grandmother   ? Colon cancer Maternal Grandfather 74  ? Amblyopia Neg Hx   ? Blindness Neg Hx   ? Glaucoma Neg Hx   ? Macular degeneration Neg Hx   ? Retinal detachment Neg Hx   ? Strabismus Neg Hx   ? Retinitis pigmentosa  Neg Hx   ? Esophageal cancer Neg Hx   ? Stomach cancer Neg Hx   ? Rectal cancer Neg Hx   ? ?SOCIAL HISTORY ?Social History  ? ?Tobacco Use  ? Smoking status: Never  ? Smokeless tobacco: Never  ?Vaping Use  ? Vapin

## 2021-05-16 DIAGNOSIS — S01501A Unspecified open wound of lip, initial encounter: Secondary | ICD-10-CM | POA: Diagnosis not present

## 2021-05-16 DIAGNOSIS — R58 Hemorrhage, not elsewhere classified: Secondary | ICD-10-CM | POA: Diagnosis not present

## 2021-05-17 ENCOUNTER — Other Ambulatory Visit: Payer: Self-pay

## 2021-05-17 ENCOUNTER — Encounter (INDEPENDENT_AMBULATORY_CARE_PROVIDER_SITE_OTHER): Payer: Self-pay | Admitting: Ophthalmology

## 2021-05-17 ENCOUNTER — Ambulatory Visit (INDEPENDENT_AMBULATORY_CARE_PROVIDER_SITE_OTHER): Payer: 59 | Admitting: Ophthalmology

## 2021-05-17 DIAGNOSIS — H25812 Combined forms of age-related cataract, left eye: Secondary | ICD-10-CM | POA: Diagnosis not present

## 2021-05-17 DIAGNOSIS — H4312 Vitreous hemorrhage, left eye: Secondary | ICD-10-CM

## 2021-05-17 DIAGNOSIS — H35033 Hypertensive retinopathy, bilateral: Secondary | ICD-10-CM | POA: Diagnosis not present

## 2021-05-17 DIAGNOSIS — Z961 Presence of intraocular lens: Secondary | ICD-10-CM

## 2021-05-17 DIAGNOSIS — H4313 Vitreous hemorrhage, bilateral: Secondary | ICD-10-CM | POA: Diagnosis not present

## 2021-05-17 DIAGNOSIS — I10 Essential (primary) hypertension: Secondary | ICD-10-CM

## 2021-05-17 DIAGNOSIS — H44001 Unspecified purulent endophthalmitis, right eye: Secondary | ICD-10-CM

## 2021-05-17 DIAGNOSIS — E113513 Type 2 diabetes mellitus with proliferative diabetic retinopathy with macular edema, bilateral: Secondary | ICD-10-CM | POA: Diagnosis not present

## 2021-05-17 DIAGNOSIS — H4311 Vitreous hemorrhage, right eye: Secondary | ICD-10-CM

## 2021-05-18 ENCOUNTER — Encounter (INDEPENDENT_AMBULATORY_CARE_PROVIDER_SITE_OTHER): Payer: Self-pay | Admitting: Ophthalmology

## 2021-05-18 MED ORDER — AFLIBERCEPT 2MG/0.05ML IZ SOLN FOR KALEIDOSCOPE
2.0000 mg | INTRAVITREAL | Status: AC | PRN
  Administered 2021-05-18: 2 mg via INTRAVITREAL

## 2021-05-19 DIAGNOSIS — E785 Hyperlipidemia, unspecified: Secondary | ICD-10-CM | POA: Diagnosis not present

## 2021-05-19 DIAGNOSIS — E1139 Type 2 diabetes mellitus with other diabetic ophthalmic complication: Secondary | ICD-10-CM | POA: Diagnosis not present

## 2021-05-19 DIAGNOSIS — E1169 Type 2 diabetes mellitus with other specified complication: Secondary | ICD-10-CM | POA: Diagnosis not present

## 2021-05-19 DIAGNOSIS — Z6834 Body mass index (BMI) 34.0-34.9, adult: Secondary | ICD-10-CM | POA: Diagnosis not present

## 2021-05-19 DIAGNOSIS — Z794 Long term (current) use of insulin: Secondary | ICD-10-CM | POA: Diagnosis not present

## 2021-05-19 DIAGNOSIS — Z23 Encounter for immunization: Secondary | ICD-10-CM | POA: Diagnosis not present

## 2021-05-19 DIAGNOSIS — I10 Essential (primary) hypertension: Secondary | ICD-10-CM | POA: Diagnosis not present

## 2021-05-20 ENCOUNTER — Encounter: Payer: Self-pay | Admitting: Family Medicine

## 2021-05-30 ENCOUNTER — Other Ambulatory Visit: Payer: Self-pay | Admitting: Family Medicine

## 2021-06-10 NOTE — Progress Notes (Signed)
?[facility] Note ? ?06/14/2021 ? ?  ? ?CHIEF COMPLAINT ?Patient presents for Retina Follow Up ? ?HISTORY OF PRESENT ILLNESS: ?Gloria Gomez is a 51 y.o. female who presents to the clinic today for:  ?HPI   ? ? Retina Follow Up   ?Patient presents with  Diabetic Retinopathy.  In both eyes.  This started 4 weeks ago.  I, the attending physician,  performed the HPI with the patient and updated documentation appropriately. ? ?  ?  ? ? Comments   ?Patient here for 4 weeks retina follow up for PDR OU. Patient states vision about the same. Not quite as sharp the last couple of months but doing ok. No eye pain. On new medicine farxiga 10 mg. A1C 7.1. ? ?  ?  ?Last edited by Bernarda Caffey, MD on 06/14/2021 10:26 PM.  ?  ?Pt states she started farxiga about a month ago, she was taken off Jardiance, she feels like her blood sugar numbers have been good, her last A1c was 7.1 on 3.23.23 ?  ? ?Referring physician: ? ? ?HISTORICAL INFORMATION:  ? ?Selected notes from the [facility] ?Referred for DM exam  ? ?CURRENT MEDICATIONS: ?No current outpatient medications on file. (Ophthalmic Drugs)  ? ?No current facility-administered medications for this visit. (Ophthalmic Drugs)  ? ?Current Outpatient Medications (Other)  ?Medication Sig  ? amLODipine (NORVASC) 5 MG tablet Take 1 tablet (5 mg total) by mouth daily.  ? furosemide (LASIX) 20 MG tablet TAKE 2 TABLETS BY MOUTH IN THE MORNING AND 1 IN THE EVENING  ? gabapentin (NEURONTIN) 300 MG capsule TAKE 1 CAPSULE BY MOUTH THREE TIMES DAILY  ? HYDROcodone-acetaminophen (NORCO/VICODIN) 5-325 MG tablet Take 1-2 tablets by mouth every 6 (six) hours as needed for moderate pain.  ? insulin aspart (NOVOLOG) 100 UNIT/ML injection Inject into the skin 3 (three) times daily before meals. Per endocrinologist  ? LANTUS SOLOSTAR 100 UNIT/ML Solostar Pen INJECT 90 TO 100 UNITS SUBCUTANEOUSLY AT BEDTIME GRADUALLY TITRATING TO THIS DOSE TO GET TARGET FASTING  GLUCOSE RANGE OF 100-110 (Patient taking differently: 24-30 Units. INJECT 90 TO 100 UNITS SUBCUTANEOUSLY AT BEDTIME GRADUALLY TITRATING TO THIS DOSE TO GET TARGET FASTING GLUCOSE RANGE OF 100-110)  ? losartan (COZAAR) 100 MG tablet Take 1 tablet (100 mg total) by mouth daily.  ? metFORMIN (GLUCOPHAGE) 1000 MG tablet TAKE 1 TABLET BY MOUTH TWICE DAILY WITH MEALS  ? metoprolol succinate (TOPROL-XL) 100 MG 24 hr tablet Take 2 tablets by mouth once daily  ? Multiple Vitamin (MULTIVITAMIN WITH MINERALS) TABS tablet Take 1 tablet by mouth daily.  ? pantoprazole (PROTONIX) 40 MG tablet TAKE 1 TABLET BY MOUTH ONCE DAILY .  ? promethazine (PHENERGAN) 12.5 MG tablet TAKE 1 TO 2 TABLETS BY MOUTH EVERY 6 HOURS AS NEEDED FOR NAUSEA  ? rosuvastatin (CRESTOR) 10 MG tablet Take 1 tablet (10 mg total) by mouth daily.  ? verapamil (CALAN-SR) 240 MG CR tablet Take 1 tablet (240 mg total) by mouth at bedtime.  ? VICTOZA 18 MG/3ML SOPN INJECT 1.$RemoveBefor'2MG'[id number]$  INTO THE SKIN DAILY  ? empagliflozin (JARDIANCE) 10 MG TABS tablet Take 10 mg by mouth daily. (Patient not taking: Reported on 06/14/2021)  ? metoCLOPramide (REGLAN) 5 MG tablet TAKE 1 TABLET BY MOUTH 4 TIMES DAILY BEFORE MEAL(S) AND AT BEDTIME (Patient not taking: Reported on 06/14/2021)  ? OVER THE COUNTER MEDICATION Take 2 capsules by mouth daily. Neu Remedy  ? ?No current facility-administered medications for this  visit. (Other)  ? ?REVIEW OF SYSTEMS: ?ROS   ?Positive for: Endocrine, Eyes ?Negative for: Constitutional, Gastrointestinal, Neurological, Skin, Genitourinary, Musculoskeletal, HENT, Cardiovascular, Respiratory, Psychiatric, Allergic/Imm, Heme/Lymph ?Last edited by Theodore Demark, COA on 06/14/2021  1:41 PM.  ?  ? ?ALLERGIES ?Allergies  ?Allergen Reactions  ? Gluten Meal Swelling  ? Lisinopril Cough  ? Pioglitazone Other (See Comments)  ?  ELEVATED glucoses + worse chronic nausea  ? ?PAST MEDICAL HISTORY ?Past Medical History:  ?Diagnosis Date  ? Abdominal bloating   ? likely  from diab gastroparesis.  Dr. Havery Moros started trial of reglan 03/2019.  ? Benign brain tumor ([place])   ? Cystic lesion in cerebral aqueduct region with mild hydrocephalus-- stable MRI 02/2016.  Surveillance MRI 05/2017 --dilated cerebral aqueduct related to aqueductal stenosis and subsequent mild hydrocephalus (due to the 11 mm stable cystic lesion in cerebral aqueduct---?congenitial?.  ? Cataract   ? OU  ? Dysmenorrhea   ? vicodin occ during first 2 days of cycle.  ? Fibromyalgia   ? Gluten intolerance   ? pt reports she underwent full GI w/u to r/o celiac dz  ? Hepatic steatosis   ? ultrasound 08/2017. Hx of very mild elevation of ALT.  Stable on u/s 06/2020  ? History of adenomatous polyp of colon 04/08/2019  ? recall Feb 2024  ? Hyperlipidemia, mixed   ? Hypertension   ? +white coat component  ? Hypertensive retinopathy of both eyes   ? Insomnia   ? Iron deficiency 01/2019  ? Hb 11.3. Hemoccults neg x 3 03/19/19. EGD and colonoscopy 04/08/19 showed NO cause for IDA.  Pt does have menorrhagia, though, so she'll see her GYN.  started FeSO4 325 qd approx 04/14/19.  ? Menorrhagia   ? resulting in [place] 2021  ? PMR (polymyalgia rheumatica) ([place]) 2022  ? question of; hx of elevated ESR-->better with prednisone but prednisone was contraindicated d/t eye issues/hyperglycemia-->rheum started following her 02/2021->plaquenil 04/2021  ? Proliferative diabetic retinopathy of both eyes ([place])   ? steroid injections 10/2017--improved  ? Sensorineural hearing loss of left ear   ? Sudden left hearing loss summer 2016--no improvement with steroids 01/2015 so brain MRI done by Dr. Redmond Baseman and it showed brain tumor that was determined to be benign.  Pt's hearing not bad enough for hearing aid as of 06/2016.  ? Type 2 diabetes with complication ([place])   ? +microalbuminuria, diab retpthy, diabetic gastroparesis (gastric emptying study mildly abnl 03/2017).  Recommended lantus 08/2018 but pt declined. Mild microalbuminuria.  ? Uterine fibroid 2022  ?  per pt report, pelvic u/s in GYN office  ? ?Past Surgical History:  ?Procedure Laterality Date  ? ANOSCOPY  05/12/2019  ? Procedure: normal exam, minimal hemorrhoid disease. Hyertrophied anal papila, benign appearing, posterior midline. Surgeon: Leighton Ruff MD  ? CHOLECYSTECTOMY  2000  ? COLONOSCOPY  04/08/2019  ? 5 adenomas, recall 3 yrs; no cause for IDA found.  Hypertrophied anal papillae->bx showed low grade dysplasia; GI referred her to colorectal surgeon.  ? ESOPHAGOGASTRODUODENOSCOPY  04/08/2019  ? mild chronic reactive gastritis. H pylori NEG.  No cause for IDA found.  ? GAS INSERTION Right 10/31/2018  ? Procedure: Insertion Of C3F8 Gas;  Surgeon: Bernarda Caffey, MD;  Location: [facility];  Service: Ophthalmology;  Laterality: Right;  ? GASTRIC EMPTYING SCAN  04/20/2017  ? Mildly abnormal, particularly the 1st hour of emptying.  ? MEMBRANE PEEL Right 10/31/2018  ? Procedure: MEMBRANE PEEL;  Surgeon: Bernarda Caffey, MD;  Location:  [facility];  Service: Ophthalmology;  Laterality: Right;  ? PARS PLANA VITRECTOMY Right 04/12/2018  ? Procedure: Right PARS PLANA VITRECTOMY WITH 25 GAUGE with intravitreal antibiotics;  Surgeon: Bernarda Caffey, MD;  Location: [facility];  Service: Ophthalmology;  Laterality: Right;  ? PARS PLANA VITRECTOMY Right 10/31/2018  ? Procedure: PARS PLANA VITRECTOMY WITH 25 GAUGE;  Surgeon: Bernarda Caffey, MD;  Location: [facility];  Service: Ophthalmology;  Laterality: Right;  ? PHOTOCOAGULATION WITH LASER Right 10/31/2018  ? Procedure: Photocoagulation With Laser;  Surgeon: Bernarda Caffey, MD;  Location: [facility];  Service: Ophthalmology;  Laterality: Right;  ? TRANSTHORACIC ECHOCARDIOGRAM  08/23/2020  ? Grd I DD, o/w normal.  ? ?FAMILY HISTORY ?Family History  ?Problem Relation Age of Onset  ? Brain cancer Mother   ? Diabetes Father   ? Diabetes Maternal Grandmother   ? Cataracts Maternal Grandmother   ? Cervical cancer Paternal Grandmother   ? Colon cancer Maternal Grandfather 22  ? Amblyopia Neg Hx   ?  Blindness Neg Hx   ? Glaucoma Neg Hx   ? Macular degeneration Neg Hx   ? Retinal detachment Neg Hx   ? Strabismus Neg Hx   ? Retinitis pigmentosa Neg Hx   ? Esophageal cancer Neg Hx   ? Stomach cancer Neg Hx   ? Rec

## 2021-06-14 ENCOUNTER — Encounter (INDEPENDENT_AMBULATORY_CARE_PROVIDER_SITE_OTHER): Payer: Self-pay | Admitting: Ophthalmology

## 2021-06-14 ENCOUNTER — Ambulatory Visit (INDEPENDENT_AMBULATORY_CARE_PROVIDER_SITE_OTHER): Payer: 59 | Admitting: Ophthalmology

## 2021-06-14 DIAGNOSIS — I10 Essential (primary) hypertension: Secondary | ICD-10-CM | POA: Diagnosis not present

## 2021-06-14 DIAGNOSIS — Z961 Presence of intraocular lens: Secondary | ICD-10-CM | POA: Diagnosis not present

## 2021-06-14 DIAGNOSIS — H4313 Vitreous hemorrhage, bilateral: Secondary | ICD-10-CM | POA: Diagnosis not present

## 2021-06-14 DIAGNOSIS — E113513 Type 2 diabetes mellitus with proliferative diabetic retinopathy with macular edema, bilateral: Secondary | ICD-10-CM | POA: Diagnosis not present

## 2021-06-14 DIAGNOSIS — H44001 Unspecified purulent endophthalmitis, right eye: Secondary | ICD-10-CM

## 2021-06-14 DIAGNOSIS — H35033 Hypertensive retinopathy, bilateral: Secondary | ICD-10-CM | POA: Diagnosis not present

## 2021-06-14 DIAGNOSIS — H25812 Combined forms of age-related cataract, left eye: Secondary | ICD-10-CM

## 2021-06-14 DIAGNOSIS — H4312 Vitreous hemorrhage, left eye: Secondary | ICD-10-CM

## 2021-06-14 DIAGNOSIS — H4311 Vitreous hemorrhage, right eye: Secondary | ICD-10-CM

## 2021-06-14 MED ORDER — AFLIBERCEPT 2MG/0.05ML IZ SOLN FOR KALEIDOSCOPE
2.0000 mg | INTRAVITREAL | Status: AC | PRN
  Administered 2021-06-14: 2 mg via INTRAVITREAL

## 2021-06-19 ENCOUNTER — Other Ambulatory Visit: Payer: Self-pay | Admitting: Family Medicine

## 2021-06-29 ENCOUNTER — Other Ambulatory Visit: Payer: Self-pay | Admitting: Family Medicine

## 2021-06-30 ENCOUNTER — Other Ambulatory Visit: Payer: Self-pay | Admitting: Family Medicine

## 2021-07-04 NOTE — Progress Notes (Signed)
?[facility] Note ? ?07/12/2021 ? ?  ? ?CHIEF COMPLAINT ?Patient presents for Retina Follow Up ? ?HISTORY OF PRESENT ILLNESS: ?Gloria Gomez is a 51 y.o. female who presents to the clinic today for:  ?HPI   ? ? Retina Follow Up   ?Patient presents with  Diabetic Retinopathy.  In both eyes.  This started 4 weeks ago.  I, the attending physician,  performed the HPI with the patient and updated documentation appropriately. ? ?  ?  ? ? Comments   ?Patient here for 4 weeks retina follow up for PDR OU. Patient states vision ok. About the same. OD may not be as good. Has had some extra swelling in feet, ankles, and hands. And higher blood pressure. No eye pain.  ? ?  ?  ?Last edited by Bernarda Caffey, MD on 07/12/2021  2:55 PM.  ?  ? ?Pt states vision has been stable, the past few days OD has felt like it may be more blurry ?  ? ?Referring physician: ? ? ?HISTORICAL INFORMATION:  ? ?Selected notes from the [facility] ?Referred for DM exam  ? ?CURRENT MEDICATIONS: ?No current outpatient medications on file. (Ophthalmic Drugs)  ? ?No current facility-administered medications for this visit. (Ophthalmic Drugs)  ? ?Current Outpatient Medications (Other)  ?Medication Sig  ? amLODipine (NORVASC) 5 MG tablet Take 1 tablet (5 mg total) by mouth daily.  ? Continuous Blood Gluc Sensor (FREESTYLE LIBRE 3 SENSOR) MISC USE ONE SENSOR EVERY 14 DAYS FOR 28 DAYS  ? FARXIGA 10 MG TABS tablet Take 10 mg by mouth daily.  ? furosemide (LASIX) 20 MG tablet TAKE 2 TABLETS BY MOUTH IN THE MORNING AND 1 IN THE EVENING  ? gabapentin (NEURONTIN) 300 MG capsule TAKE 1 CAPSULE BY MOUTH THREE TIMES DAILY  ? HYDROcodone-acetaminophen (NORCO/VICODIN) 5-325 MG tablet Take 1-2 tablets by mouth every 6 (six) hours as needed for moderate pain.  ? insulin aspart (NOVOLOG) 100 UNIT/ML injection Inject into the skin 3 (three) times daily before meals. Per endocrinologist  ? Insulin Glargine (BASAGLAR KWIKPEN) 100 UNIT/ML  15-18 units  ? losartan (COZAAR) 100 MG tablet Take 1 tablet (100 mg total) by mouth daily.  ? metFORMIN (GLUCOPHAGE) 1000 MG tablet TAKE 1 TABLET BY MOUTH TWICE DAILY WITH MEALS  ? metoprolol succinate (TOPROL-XL) 100 MG 24 hr tablet Take 2 tablets by mouth once daily  ? Multiple Vitamin (MULTIVITAMIN WITH MINERALS) TABS tablet Take 1 tablet by mouth daily.  ? pantoprazole (PROTONIX) 40 MG tablet TAKE 1 TABLET BY MOUTH ONCE DAILY .  ? promethazine (PHENERGAN) 12.5 MG tablet TAKE 1 TO 2 TABLETS BY MOUTH EVERY 6 HOURS AS NEEDED FOR NAUSEA  ? rosuvastatin (CRESTOR) 10 MG tablet Take 1 tablet (10 mg total) by mouth daily.  ? verapamil (CALAN-SR) 240 MG CR tablet Take 1 tablet (240 mg total) by mouth at bedtime.  ? VICTOZA 18 MG/3ML SOPN INJECT 1.2MG INTO THE SKIN DAILY  ? empagliflozin (JARDIANCE) 10 MG TABS tablet Take 10 mg by mouth daily. (Patient not taking: Reported on 06/14/2021)  ? LANTUS SOLOSTAR 100 UNIT/ML Solostar Pen INJECT 90 TO 100 UNITS SUBCUTANEOUSLY AT BEDTIME GRADUALLY TITRATING TO THIS DOSE TO GET [employer] FASTING GLUCOSE RANGE OF 100-110 (Patient not taking: Reported on 07/12/2021)  ? metoCLOPramide (REGLAN) 5 MG tablet TAKE 1 TABLET BY MOUTH 4 TIMES DAILY BEFORE MEAL(S) AND AT BEDTIME (Patient not taking: Reported on 06/14/2021)  ? OVER THE COUNTER MEDICATION Take  2 capsules by mouth daily. Neu Remedy (Patient not taking: Reported on 07/12/2021)  ? ?No current facility-administered medications for this visit. (Other)  ? ?REVIEW OF SYSTEMS: ?ROS   ?Positive for: Endocrine, Eyes ?Negative for: Constitutional, Gastrointestinal, Neurological, Skin, Genitourinary, Musculoskeletal, HENT, Cardiovascular, Respiratory, Psychiatric, Allergic/Imm, Heme/Lymph ?Last edited by Theodore Demark, COA on 07/12/2021  1:41 PM.  ?  ? ?ALLERGIES ?Allergies  ?Allergen Reactions  ? Gluten Meal Swelling  ? Lisinopril Cough  ? Pioglitazone Other (See Comments)  ?  ELEVATED glucoses + worse chronic nausea  ? ?PAST MEDICAL  HISTORY ?Past Medical History:  ?Diagnosis Date  ? Abdominal bloating   ? likely from diab gastroparesis.  Dr. Havery Moros started trial of reglan 03/2019.  ? Benign brain tumor ([place])   ? Cystic lesion in cerebral aqueduct region with mild hydrocephalus-- stable MRI 02/2016.  Surveillance MRI 05/2017 --dilated cerebral aqueduct related to aqueductal stenosis and subsequent mild hydrocephalus (due to the 11 mm stable cystic lesion in cerebral aqueduct---?congenitial?.  ? Cataract   ? OU  ? Dysmenorrhea   ? vicodin occ during first 2 days of cycle.  ? Fibromyalgia   ? Gluten intolerance   ? pt reports she underwent full GI w/u to r/o celiac dz  ? Hepatic steatosis   ? ultrasound 08/2017. Hx of very mild elevation of ALT.  Stable on u/s 06/2020  ? History of adenomatous polyp of colon 04/08/2019  ? recall Feb 2024  ? Hyperlipidemia, mixed   ? Hypertension   ? +white coat component  ? Hypertensive retinopathy of both eyes   ? Insomnia   ? Iron deficiency 01/2019  ? Hb 11.3. Hemoccults neg x 3 03/19/19. EGD and colonoscopy 04/08/19 showed NO cause for IDA.  Pt does have menorrhagia, though, so she'll see her GYN.  started FeSO4 325 qd approx 04/14/19.  ? Menorrhagia   ? resulting in [place] 2021  ? PMR (polymyalgia rheumatica) ([place]) 2022  ? question of; hx of elevated ESR-->better with prednisone but prednisone was contraindicated d/t eye issues/hyperglycemia-->rheum started following her 02/2021->plaquenil 04/2021  ? Proliferative diabetic retinopathy of both eyes ([place])   ? steroid injections 10/2017--improved  ? Sensorineural hearing loss of left ear   ? Sudden left hearing loss summer 2016--no improvement with steroids 01/2015 so brain MRI done by Dr. Redmond Baseman and it showed brain tumor that was determined to be benign.  Pt's hearing not bad enough for hearing aid as of 06/2016.  ? Type 2 diabetes with complication ([place])   ? +microalbuminuria, diab retpthy, diabetic gastroparesis (gastric emptying study mildly abnl 03/2017).  Recommended  lantus 08/2018 but pt declined. Mild microalbuminuria.  ? Uterine fibroid 2022  ? per pt report, pelvic u/s in GYN office  ? ?Past Surgical History:  ?Procedure Laterality Date  ? ANOSCOPY  05/12/2019  ? Procedure: normal exam, minimal hemorrhoid disease. Hyertrophied anal papila, benign appearing, posterior midline. Surgeon: Leighton Ruff MD  ? CHOLECYSTECTOMY  2000  ? COLONOSCOPY  04/08/2019  ? 5 adenomas, recall 3 yrs; no cause for IDA found.  Hypertrophied anal papillae->bx showed low grade dysplasia; GI referred her to colorectal surgeon.  ? ESOPHAGOGASTRODUODENOSCOPY  04/08/2019  ? mild chronic reactive gastritis. H pylori NEG.  No cause for IDA found.  ? GAS INSERTION Right 10/31/2018  ? Procedure: Insertion Of C3F8 Gas;  Surgeon: Bernarda Caffey, MD;  Location: [facility];  Service: Ophthalmology;  Laterality: Right;  ? GASTRIC EMPTYING SCAN  04/20/2017  ? Mildly abnormal, particularly the 1st  hour of emptying.  ? MEMBRANE PEEL Right 10/31/2018  ? Procedure: MEMBRANE PEEL;  Surgeon: Bernarda Caffey, MD;  Location: [place];  Service: Ophthalmology;  Laterality: Right;  ? PARS PLANA VITRECTOMY Right 04/12/2018  ? Procedure: Right PARS PLANA VITRECTOMY WITH 25 GAUGE with intravitreal antibiotics;  Surgeon: Bernarda Caffey, MD;  Location: [place];  Service: Ophthalmology;  Laterality: Right;  ? PARS PLANA VITRECTOMY Right 10/31/2018  ? Procedure: PARS PLANA VITRECTOMY WITH 25 GAUGE;  Surgeon: Bernarda Caffey, MD;  Location: [place];  Service: Ophthalmology;  Laterality: Right;  ? PHOTOCOAGULATION WITH LASER Right 10/31/2018  ? Procedure: Photocoagulation With Laser;  Surgeon: Bernarda Caffey, MD;  Location: [place];  Service: Ophthalmology;  Laterality: Right;  ? TRANSTHORACIC ECHOCARDIOGRAM  08/23/2020  ? Grd I DD, o/w normal.  ? ?FAMILY HISTORY ?Family History  ?Problem Relation Age of Onset  ? Brain cancer Mother   ? Diabetes Father   ? Diabetes Maternal Grandmother   ? Cataracts Maternal Grandmother   ? Cervical cancer Paternal  Grandmother   ? Colon cancer Maternal Grandfather 20  ? Amblyopia Neg Hx   ? Blindness Neg Hx   ? Glaucoma Neg Hx   ? Macular degeneration Neg Hx   ? Retinal detachment Neg Hx   ? Strabismus Neg Hx   ? Retinitis

## 2021-07-12 ENCOUNTER — Ambulatory Visit (INDEPENDENT_AMBULATORY_CARE_PROVIDER_SITE_OTHER): Payer: 59 | Admitting: Ophthalmology

## 2021-07-12 ENCOUNTER — Encounter (INDEPENDENT_AMBULATORY_CARE_PROVIDER_SITE_OTHER): Payer: Self-pay | Admitting: Ophthalmology

## 2021-07-12 ENCOUNTER — Ambulatory Visit: Payer: 59 | Admitting: Family Medicine

## 2021-07-12 DIAGNOSIS — Z961 Presence of intraocular lens: Secondary | ICD-10-CM | POA: Diagnosis not present

## 2021-07-12 DIAGNOSIS — I10 Essential (primary) hypertension: Secondary | ICD-10-CM

## 2021-07-12 DIAGNOSIS — H25812 Combined forms of age-related cataract, left eye: Secondary | ICD-10-CM

## 2021-07-12 DIAGNOSIS — E113513 Type 2 diabetes mellitus with proliferative diabetic retinopathy with macular edema, bilateral: Secondary | ICD-10-CM

## 2021-07-12 DIAGNOSIS — H4313 Vitreous hemorrhage, bilateral: Secondary | ICD-10-CM

## 2021-07-12 DIAGNOSIS — H4312 Vitreous hemorrhage, left eye: Secondary | ICD-10-CM

## 2021-07-12 DIAGNOSIS — H35033 Hypertensive retinopathy, bilateral: Secondary | ICD-10-CM | POA: Diagnosis not present

## 2021-07-12 DIAGNOSIS — H4311 Vitreous hemorrhage, right eye: Secondary | ICD-10-CM

## 2021-07-12 DIAGNOSIS — H44001 Unspecified purulent endophthalmitis, right eye: Secondary | ICD-10-CM

## 2021-07-12 LAB — HM DIABETES EYE EXAM

## 2021-07-12 MED ORDER — AFLIBERCEPT 2MG/0.05ML IZ SOLN FOR KALEIDOSCOPE
2.0000 mg | INTRAVITREAL | Status: AC | PRN
  Administered 2021-07-12: 2 mg via INTRAVITREAL

## 2021-07-12 NOTE — Progress Notes (Deleted)
OFFICE VISIT  07/12/2021  CC: No chief complaint on file.   HPI:    Patient is a 51 y.o. female who presents for 4 mo f/u HTN, HLD, and NAFLD. A/P as of last visit: "#1 hypertension, well controlled on amlodipine 5 mg a day, losartan 100/day, and Toprol-XL 200/day. She is also on verapamil SR 240 mg a day.   #2 hyperlipidemia.  Doing well on Crestor 10 mg a day. Her last LDL was 58--this was almost a year ago.  She is not fasting today. Plan on fasting lipid panel at next follow-up in 4 months. Hepatic panel today.   #3 fibromyalgia, with recent suspicion of polymyalgia rheumatica. She had responded some clinically with prednisone and her sed rate came down some. However, her eye doctor had recommended that she not be on prednisone so we stopped it. She has rheumatology evaluation next week for further expert evaluation and management. Continue gabapentin 300 mg 3 times daily, but we discussed possible gradual increase of this medication.  Therapeutic expectations and side effect profile of medication discussed today.  Patient's questions answered.   #4 diabetes type 2, multiple complications. She is doing significantly better managed by endocrinologist since November 2022. Med list updated today."  INTERIM HX: ***  Past Medical History:  Diagnosis Date   Abdominal bloating    likely from diab gastroparesis.  Dr. Havery Moros started trial of reglan 03/2019.   Benign brain tumor ([place])    Cystic lesion in cerebral aqueduct region with mild hydrocephalus-- stable MRI 02/2016.  Surveillance MRI 05/2017 --dilated cerebral aqueduct related to aqueductal stenosis and subsequent mild hydrocephalus (due to the 11 mm stable cystic lesion in cerebral aqueduct---?congenitial?.   Cataract    OU   Dysmenorrhea    vicodin occ during first 2 days of cycle.   Fibromyalgia    Gluten intolerance    pt reports she underwent full GI w/u to r/o celiac dz   Hepatic steatosis    ultrasound 08/2017.  Hx of very mild elevation of ALT.  Stable on u/s 06/2020   History of adenomatous polyp of colon 04/08/2019   recall Feb 2024   Hyperlipidemia, mixed    Hypertension    +white coat component   Hypertensive retinopathy of both eyes    Insomnia    Iron deficiency 01/2019   Hb 11.3. Hemoccults neg x 3 03/19/19. EGD and colonoscopy 04/08/19 showed NO cause for IDA.  Pt does have menorrhagia, though, so she'll see her GYN.  started FeSO4 325 qd approx 04/14/19.   Menorrhagia    resulting in IDA 2021   PMR (polymyalgia rheumatica) ([place]) 2022   question of; hx of elevated ESR-->better with prednisone but prednisone was contraindicated d/t eye issues/hyperglycemia-->rheum started following her 02/2021->plaquenil 04/2021   Proliferative diabetic retinopathy of both eyes ([place])    steroid injections 10/2017--improved   Sensorineural hearing loss of left ear    Sudden left hearing loss summer 2016--no improvement with steroids 01/2015 so brain MRI done by Dr. Redmond Baseman and it showed brain tumor that was determined to be benign.  Pt's hearing not bad enough for hearing aid as of 06/2016.   Type 2 diabetes with complication ([facility])    +microalbuminuria, diab retpthy, diabetic gastroparesis (gastric emptying study mildly abnl 03/2017).  Recommended lantus 08/2018 but pt declined. Mild microalbuminuria.   Uterine fibroid 2022   per pt report, pelvic u/s in GYN office    Past Surgical History:  Procedure Laterality Date   ANOSCOPY  05/12/2019   Procedure: normal exam, minimal hemorrhoid disease. Hyertrophied anal papila, benign appearing, posterior midline. Surgeon: Leighton Ruff MD   CHOLECYSTECTOMY  2000   COLONOSCOPY  04/08/2019   5 adenomas, recall 3 yrs; no cause for IDA found.  Hypertrophied anal papillae->bx showed low grade dysplasia; GI referred her to colorectal surgeon.   ESOPHAGOGASTRODUODENOSCOPY  04/08/2019   mild chronic reactive gastritis. H pylori NEG.  No cause for IDA found.   GAS INSERTION Right  10/31/2018   Procedure: Insertion Of C3F8 Gas;  Surgeon: Bernarda Caffey, MD;  Location: [place] [facility];  Service: Ophthalmology;  Laterality: Right;   GASTRIC EMPTYING SCAN  04/20/2017   Mildly abnormal, particularly the 1st hour of emptying.   MEMBRANE PEEL Right 10/31/2018   Procedure: MEMBRANE PEEL;  Surgeon: Bernarda Caffey, MD;  Location: [place];  Service: Ophthalmology;  Laterality: Right;   PARS PLANA VITRECTOMY Right 04/12/2018   Procedure: Right PARS PLANA VITRECTOMY WITH 25 GAUGE with intravitreal antibiotics;  Surgeon: Bernarda Caffey, MD;  Location: [facility] [place];  Service: Ophthalmology;  Laterality: Right;   PARS PLANA VITRECTOMY Right 10/31/2018   Procedure: PARS PLANA VITRECTOMY WITH 25 GAUGE;  Surgeon: Bernarda Caffey, MD;  Location: [place];  Service: Ophthalmology;  Laterality: Right;   PHOTOCOAGULATION WITH LASER Right 10/31/2018   Procedure: Photocoagulation With Laser;  Surgeon: Bernarda Caffey, MD;  Location: [facility];  Service: Ophthalmology;  Laterality: Right;   TRANSTHORACIC ECHOCARDIOGRAM  08/23/2020   Grd I DD, o/w normal.    Outpatient Medications Prior to Visit  Medication Sig Dispense Refill   Insulin Glargine (BASAGLAR KWIKPEN) 100 UNIT/ML 15-18 units     amLODipine (NORVASC) 5 MG tablet Take 1 tablet (5 mg total) by mouth daily. 90 tablet 3   Continuous Blood Gluc Sensor (FREESTYLE LIBRE 3 SENSOR) MISC USE ONE SENSOR EVERY 14 DAYS FOR 28 DAYS     empagliflozin (JARDIANCE) 10 MG TABS tablet Take 10 mg by mouth daily. (Patient not taking: Reported on 06/14/2021)     FARXIGA 10 MG TABS tablet Take 10 mg by mouth daily.     furosemide (LASIX) 20 MG tablet TAKE 2 TABLETS BY MOUTH IN THE MORNING AND 1 IN THE EVENING 90 tablet 0   gabapentin (NEURONTIN) 300 MG capsule TAKE 1 CAPSULE BY MOUTH THREE TIMES DAILY 90 capsule 0   HYDROcodone-acetaminophen (NORCO/VICODIN) 5-325 MG tablet Take 1-2 tablets by mouth every 6 (six) hours as needed for moderate pain. 40 tablet 0   insulin aspart (NOVOLOG) 100  UNIT/ML injection Inject into the skin 3 (three) times daily before meals. Per endocrinologist     LANTUS SOLOSTAR 100 UNIT/ML Solostar Pen INJECT 90 TO 100 UNITS SUBCUTANEOUSLY AT BEDTIME GRADUALLY TITRATING TO THIS DOSE TO GET [employer]ARGET FASTING GLUCOSE RANGE OF 100-110 30 mL 0   losartan (COZAAR) 100 MG tablet Take 1 tablet (100 mg total) by mouth daily. 90 tablet 3   metFORMIN (GLUCOPHAGE) 1000 MG tablet TAKE 1 TABLET BY MOUTH TWICE DAILY WITH MEALS 60 tablet 0   metoCLOPramide (REGLAN) 5 MG tablet TAKE 1 TABLET BY MOUTH 4 TIMES DAILY BEFORE MEAL(S) AND AT BEDTIME (Patient not taking: Reported on 06/14/2021) 360 tablet 3   metoprolol succinate (TOPROL-XL) 100 MG 24 hr tablet Take 2 tablets by mouth once daily 180 tablet 3   Multiple Vitamin (MULTIVITAMIN WITH MINERALS) TABS tablet Take 1 tablet by mouth daily.     OVER THE COUNTER MEDICATION Take 2 capsules by mouth daily. Neu Remedy  pantoprazole (PROTONIX) 40 MG tablet TAKE 1 TABLET BY MOUTH ONCE DAILY . 90 tablet 3   promethazine (PHENERGAN) 12.5 MG tablet TAKE 1 TO 2 TABLETS BY MOUTH EVERY 6 HOURS AS NEEDED FOR NAUSEA 30 tablet 0   rosuvastatin (CRESTOR) 10 MG tablet Take 1 tablet (10 mg total) by mouth daily. 90 tablet 3   verapamil (CALAN-SR) 240 MG CR tablet Take 1 tablet (240 mg total) by mouth at bedtime. 90 tablet 3   VICTOZA 18 MG/3ML SOPN INJECT 1.2MG INTO THE SKIN DAILY 18 mL 0   No facility-administered medications prior to visit.    Allergies  Allergen Reactions   Gluten Meal Swelling   Lisinopril Cough   Pioglitazone Other (See Comments)    ELEVATED glucoses + worse chronic nausea    ROS As per HPI  PE:    03/11/2021    1:01 PM 12/09/2020    2:09 PM 11/10/2020    1:03 PM  Vitals with BMI  Height 5' 3"  5' 3"  Weight 194 lbs 6 oz 195 lbs 198 lbs 13 oz  BMI 34.44 68.34 19.62  Systolic 229 798 921  Diastolic 72 76 78  Pulse 92 88 87   Physical Exam  ***  LABS:  Last CBC Lab Results  Component Value Date    WBC 8.8 03/17/2021   HGB 11.5 (A) 03/17/2021   HCT 35 (A) 03/17/2021   MCV 83.8 07/12/2020   MCH 27.5 07/12/2020   RDW 14.1 07/12/2020   PLT 506 (A) 03/17/2021   Lab Results  Component Value Date   IRON 44 (L) 07/12/2020   TIBC 447 07/12/2020   FERRITIN 26 19/41/7408   Last metabolic panel Lab Results  Component Value Date   GLUCOSE 92 03/11/2021   NA 143 03/11/2021   K 4.7 03/11/2021   CL 106 03/11/2021   CO2 26 03/11/2021   BUN 20 03/11/2021   CREATININE 0.93 03/11/2021   GFRNONAA >60 10/31/2018   CALCIUM 9.2 03/11/2021   PROT 6.4 03/11/2021   ALBUMIN 3.9 11/10/2020   BILITOT 0.1 (L) 03/11/2021   ALKPHOS 83 11/10/2020   AST 19 03/11/2021   ALT 20 03/11/2021   ANIONGAP 12 10/31/2018   Last lipids Lab Results  Component Value Date   CHOL 141 04/22/2020   HDL 43.20 04/22/2020   LDLCALC 58 04/22/2020   LDLDIRECT 77.0 07/19/2016   TRIG 195.0 (H) 04/22/2020   CHOLHDL 3 04/22/2020   Last hemoglobin A1c Lab Results  Component Value Date   HGBA1C 8.6 (H) 11/10/2020   Last thyroid functions Lab Results  Component Value Date   TSH 4.24 11/10/2020   T3TOTAL 129 11/10/2020   Last vitamin B12 and Folate Lab Results  Component Value Date   VITAMINB12 967 01/29/2019   IMPRESSION AND PLAN:  No problem-specific Assessment & Plan notes found for this encounter.   An After Visit Summary was printed and given to the patient.  FOLLOW UP: No follow-ups on file.  Signed:  Crissie Sickles, MD           07/12/2021

## 2021-07-15 ENCOUNTER — Encounter: Payer: Self-pay | Admitting: Family Medicine

## 2021-07-15 ENCOUNTER — Ambulatory Visit (INDEPENDENT_AMBULATORY_CARE_PROVIDER_SITE_OTHER): Payer: 59 | Admitting: Family Medicine

## 2021-07-15 VITALS — BP 121/66 | HR 87 | Temp 98.8°F | Ht 63.0 in | Wt 192.4 lb

## 2021-07-15 DIAGNOSIS — M353 Polymyalgia rheumatica: Secondary | ICD-10-CM | POA: Diagnosis not present

## 2021-07-15 DIAGNOSIS — I10 Essential (primary) hypertension: Secondary | ICD-10-CM | POA: Diagnosis not present

## 2021-07-15 DIAGNOSIS — E7800 Pure hypercholesterolemia, unspecified: Secondary | ICD-10-CM | POA: Diagnosis not present

## 2021-07-15 DIAGNOSIS — K760 Fatty (change of) liver, not elsewhere classified: Secondary | ICD-10-CM | POA: Diagnosis not present

## 2021-07-15 DIAGNOSIS — M797 Fibromyalgia: Secondary | ICD-10-CM

## 2021-07-15 MED ORDER — HYDROCODONE-ACETAMINOPHEN 5-325 MG PO TABS: 1.0000 | ORAL_TABLET | Freq: Four times a day (QID) | ORAL | 0 refills | Status: DC | PRN

## 2021-07-15 NOTE — Progress Notes (Signed)
OFFICE VISIT  07/15/2021  CC:  Chief Complaint  Patient presents with   Hypertension   Hyperlipidemia   Patient is a 51 y.o. female who presents for 24mof/u HTN, HLD, fibromyalgia/widespread musculoskeletal pain. A/P as of last visit: "#1 hypertension, well controlled on amlodipine 5 mg a day, losartan 100/day, and Toprol-XL 200/day. She is also on verapamil SR 240 mg a day.   #2 hyperlipidemia.  Doing well on Crestor 10 mg a day. Her last LDL was 58--this was almost a year ago.  She is not fasting today. Plan on fasting lipid panel at next follow-up in 4 months. Hepatic panel today.   #3 fibromyalgia, with recent suspicion of polymyalgia rheumatica. She had responded some clinically with prednisone and her sed rate came down some. However, her eye doctor had recommended that she not be on prednisone so we stopped it. She has rheumatology evaluation next week for further expert evaluation and management. Continue gabapentin 300 mg 3 times daily, but we discussed possible gradual increase of this medication.  Therapeutic expectations and side effect profile of medication discussed today.  Patient's questions answered.   #4 diabetes type 2, multiple complications. She is doing significantly better managed by endocrinologist since November 2022. Med list updated today."  INTERIM HX: No acute complaints. Swelling has been pretty stable, perhaps a little bit worse for the last month.  Ran out of Lasix for a little while. No home blood pressure monitoring. Rheumatology follow-up about 2 months ago she was prescribed Plaquenil but her ophthalmologist recommended she not start this medication.  Overall, her pain remains pretty constant and had a steady intensity.  Basically from neck down to legs.  No rash.  The joints and muscles feel painful. No joint swelling.  No fever.  She rarely uses a Vicodin tablet, states about 3 to 4/month.  A1c a month ago at her endocrinologist was  7.1%.  ROS as above, plus--> no CP, no SOB, no wheezing, no cough, no dizziness, no HAs, no rashes, no melena/hematochezia.  No polyuria or polydipsia.    No focal weakness, paresthesias, or tremors.  No acute vision or hearing abnormalities.  No dysuria or unusual/new urinary urgency or frequency.  No recent changes in lower legs. No n/v/d or abd pain.  No palpitations.     Past Medical History:  Diagnosis Date   Abdominal bloating    likely from diab gastroparesis.  Dr. AHavery Morosstarted trial of reglan 03/2019.   Benign brain tumor ([facility]    Cystic lesion in cerebral aqueduct region with mild hydrocephalus-- stable MRI 02/2016.  Surveillance MRI 05/2017 --dilated cerebral aqueduct related to aqueductal stenosis and subsequent mild hydrocephalus (due to the 11 mm stable cystic lesion in cerebral aqueduct---?congenitial?.   Cataract    OU   Dysmenorrhea    vicodin occ during first 2 days of cycle.   Fibromyalgia    Gluten intolerance    pt reports she underwent full GI w/u to r/o celiac dz   Hepatic steatosis    ultrasound 08/2017. Hx of very mild elevation of ALT.  Stable on u/s 06/2020   History of adenomatous polyp of colon 04/08/2019   recall Feb 2024   Hyperlipidemia, mixed    Hypertension    +white coat component   Hypertensive retinopathy of both eyes    Insomnia    Iron deficiency 01/2019   Hb 11.3. Hemoccults neg x 3 03/19/19. EGD and colonoscopy 04/08/19 showed NO cause for IDA.  Pt does have  menorrhagia, though, so she'll see her GYN.  started FeSO4 325 qd approx 04/14/19.   Menorrhagia    resulting in IDA 2021   PMR (polymyalgia rheumatica) ([place]) 2022   question of; hx of elevated ESR-->better with prednisone but prednisone was contraindicated d/t eye issues/hyperglycemia-->rheum started following her 02/2021->plaquenil 04/2021   Proliferative diabetic retinopathy of both eyes ([place])    steroid injections 10/2017--improved   Sensorineural hearing loss of left ear    Sudden left  hearing loss summer 2016--no improvement with steroids 01/2015 so brain MRI done by Dr. Redmond Baseman and it showed brain tumor that was determined to be benign.  Pt's hearing not bad enough for hearing aid as of 06/2016.   Type 2 diabetes with complication (HCC)    +microalbuminuria, diab retpthy, diabetic gastroparesis (gastric emptying study mildly abnl 03/2017).  Recommended lantus 08/2018 but pt declined. Mild microalbuminuria.   Uterine fibroid 2022   per pt report, pelvic u/s in GYN office    Past Surgical History:  Procedure Laterality Date   ANOSCOPY  05/12/2019   Procedure: normal exam, minimal hemorrhoid disease. Hyertrophied anal papila, benign appearing, posterior midline. Surgeon: Leighton Ruff MD   CHOLECYSTECTOMY  2000   COLONOSCOPY  04/08/2019   5 adenomas, recall 3 yrs; no cause for IDA found.  Hypertrophied anal papillae->bx showed low grade dysplasia; GI referred her to colorectal surgeon.   ESOPHAGOGASTRODUODENOSCOPY  04/08/2019   mild chronic reactive gastritis. H pylori NEG.  No cause for IDA found.   GAS INSERTION Right 10/31/2018   Procedure: Insertion Of C3F8 Gas;  Surgeon: Bernarda Caffey, MD;  Location: [facility];  Service: Ophthalmology;  Laterality: Right;   GASTRIC EMPTYING SCAN  04/20/2017   Mildly abnormal, particularly the 1st hour of emptying.   MEMBRANE PEEL Right 10/31/2018   Procedure: MEMBRANE PEEL;  Surgeon: Bernarda Caffey, MD;  Location: [facility];  Service: Ophthalmology;  Laterality: Right;   PARS PLANA VITRECTOMY Right 04/12/2018   Procedure: Right PARS PLANA VITRECTOMY WITH 25 GAUGE with intravitreal antibiotics;  Surgeon: Bernarda Caffey, MD;  Location: [facility];  Service: Ophthalmology;  Laterality: Right;   PARS PLANA VITRECTOMY Right 10/31/2018   Procedure: PARS PLANA VITRECTOMY WITH 25 GAUGE;  Surgeon: Bernarda Caffey, MD;  Location: [facility];  Service: Ophthalmology;  Laterality: Right;   PHOTOCOAGULATION WITH LASER Right 10/31/2018   Procedure: Photocoagulation With  Laser;  Surgeon: Bernarda Caffey, MD;  Location: [facility];  Service: Ophthalmology;  Laterality: Right;   TRANSTHORACIC ECHOCARDIOGRAM  08/23/2020   Grd I DD, o/w normal.    Outpatient Medications Prior to Visit  Medication Sig Dispense Refill   amLODipine (NORVASC) 5 MG tablet Take 1 tablet (5 mg total) by mouth daily. 90 tablet 3   Continuous Blood Gluc Sensor (FREESTYLE LIBRE 3 SENSOR) MISC USE ONE SENSOR EVERY 14 DAYS FOR 28 DAYS     FARXIGA 10 MG TABS tablet Take 10 mg by mouth daily.     furosemide (LASIX) 20 MG tablet TAKE 2 TABLETS BY MOUTH IN THE MORNING AND 1 IN THE EVENING 90 tablet 0   gabapentin (NEURONTIN) 300 MG capsule TAKE 1 CAPSULE BY MOUTH THREE TIMES DAILY 90 capsule 0   HYDROcodone-acetaminophen (NORCO/VICODIN) 5-325 MG tablet Take 1-2 tablets by mouth every 6 (six) hours as needed for moderate pain. 40 tablet 0   insulin aspart (NOVOLOG) 100 UNIT/ML injection Inject into the skin 3 (three) times daily before meals. Per endocrinologist     Insulin Glargine (BASAGLAR KWIKPEN) 100 UNIT/ML 15-18  units     losartan (COZAAR) 100 MG tablet Take 1 tablet (100 mg total) by mouth daily. 90 tablet 3   metFORMIN (GLUCOPHAGE) 1000 MG tablet TAKE 1 TABLET BY MOUTH TWICE DAILY WITH MEALS 60 tablet 0   metoCLOPramide (REGLAN) 5 MG tablet TAKE 1 TABLET BY MOUTH 4 TIMES DAILY BEFORE MEAL(S) AND AT BEDTIME 360 tablet 3   metoprolol succinate (TOPROL-XL) 100 MG 24 hr tablet Take 2 tablets by mouth once daily 180 tablet 3   Multiple Vitamin (MULTIVITAMIN WITH MINERALS) TABS tablet Take 1 tablet by mouth daily.     pantoprazole (PROTONIX) 40 MG tablet TAKE 1 TABLET BY MOUTH ONCE DAILY . 90 tablet 3   promethazine (PHENERGAN) 12.5 MG tablet TAKE 1 TO 2 TABLETS BY MOUTH EVERY 6 HOURS AS NEEDED FOR NAUSEA 30 tablet 0   rosuvastatin (CRESTOR) 10 MG tablet Take 1 tablet (10 mg total) by mouth daily. 90 tablet 3   verapamil (CALAN-SR) 240 MG CR tablet Take 1 tablet (240 mg total) by mouth at bedtime. 90  tablet 3   VICTOZA 18 MG/3ML SOPN INJECT 1.2MG INTO THE SKIN DAILY 18 mL 0   LANTUS SOLOSTAR 100 UNIT/ML Solostar Pen INJECT 90 TO 100 UNITS SUBCUTANEOUSLY AT BEDTIME GRADUALLY TITRATING TO THIS DOSE TO GET [employer] FASTING GLUCOSE RANGE OF 100-110 (Patient not taking: Reported on 07/12/2021) 30 mL 0   OVER THE COUNTER MEDICATION Take 2 capsules by mouth daily. Neu Remedy (Patient not taking: Reported on 07/12/2021)     empagliflozin (JARDIANCE) 10 MG TABS tablet Take 10 mg by mouth daily. (Patient not taking: Reported on 06/14/2021)     No facility-administered medications prior to visit.    Allergies  Allergen Reactions   Gluten Meal Swelling   Lisinopril Cough   Pioglitazone Other (See Comments)    ELEVATED glucoses + worse chronic nausea    ROS As per HPI  PE:    07/15/2021    2:27 PM 03/11/2021    1:01 PM 12/09/2020    2:09 PM  Vitals with BMI  Height 5' 3" 5' 3"   Weight 192 lbs 6 oz 194 lbs 6 oz 195 lbs  BMI 34.09 78.58 85.02  Systolic 774 128 786  Diastolic 66 72 76  Pulse 87 92 88     Physical Exam  Gen: Alert, well appearing.  Patient is oriented to person, place, time, and situation. AFFECT: pleasant, lucid thought and speech. CV: RRR, no m/r/g.   LUNGS: CTA bilat, nonlabored resps, good aeration in all lung fields. EXT: no clubbing or cyanosis.  2+ bilat LL pitting edema.    LABS:  Last CBC Lab Results  Component Value Date   WBC 8.8 03/17/2021   HGB 11.5 (A) 03/17/2021   HCT 35 (A) 03/17/2021   MCV 83.8 07/12/2020   MCH 27.5 07/12/2020   RDW 14.1 07/12/2020   PLT 506 (A) 03/17/2021   Lab Results  Component Value Date   IRON 44 (L) 07/12/2020   TIBC 447 07/12/2020   FERRITIN 26 76/72/0947   Last metabolic panel Lab Results  Component Value Date   GLUCOSE 92 03/11/2021   NA 143 03/11/2021   K 4.7 03/11/2021   CL 106 03/11/2021   CO2 26 03/11/2021   BUN 20 03/11/2021   CREATININE 0.93 03/11/2021   GFRNONAA >60 10/31/2018   CALCIUM 9.2  03/11/2021   PROT 6.4 03/11/2021   ALBUMIN 3.9 11/10/2020   BILITOT 0.1 (L) 03/11/2021   ALKPHOS 83  11/10/2020   AST 19 03/11/2021   ALT 20 03/11/2021   ANIONGAP 12 10/31/2018   Last lipids Lab Results  Component Value Date   CHOL 141 04/22/2020   HDL 43.20 04/22/2020   LDLCALC 58 04/22/2020   LDLDIRECT 77.0 07/19/2016   TRIG 195.0 (H) 04/22/2020   CHOLHDL 3 04/22/2020   Last hemoglobin A1c Lab Results  Component Value Date   HGBA1C 8.6 (H) 11/10/2020   Last thyroid functions Lab Results  Component Value Date   TSH 4.24 11/10/2020   T3TOTAL 129 11/10/2020   IMPRESSION AND PLAN:  #1 fibromyalgia with suspected superimposed polymyalgia rheumatica. Severe diabetic retinopathy and relatively uncontrolled diabetes precludes use of prednisone chronic prednisone treatment for her PMR.  Rheumatology is following her, most recent visit was 05/10/21.  A steroid-sparing agent has not been an option for her due to elevated LFTs.  Most recently her ophthalmologist stated she should not take Plaquenil.  NSAIDs and methotrexate too much risk of worsening her NAFLD. Her current mainstay of pain treatment is gabapentin and as needed Vicodin, which she uses very little of. #40 Vicodin 5/325 prescribed today, 1-2 tabs every 6 as needed pain. She is unable to work due to her chronic pain. She gave me some disability paperwork to fill out we talked about this some today.  #2 hypertension, currently well controlled on amlodipine 5 mg a day, losartan 100 mg a day, Toprol-XL 200 mg a day.  Complete metabolic panel when she returns fasting.  3.  Hyperlipidemia: Tolerating 10 mg Crestor every other day. LDL goal is less than 70. Her LDL was 58 when last checked over a year ago. She will return for fasting lipids and hepatic panel.  #4 nonalcoholic fatty liver disease. Abdominal ultrasound 07/07/2020--steatosis. Following LFTs with upcoming fasting labs.  5.  Type 2 diabetes with multiple  complications--- followed by endocrinologist. Updated med list: Vania Rea was recently switched to [place] due to insurance. Lantus was recently switched to [employer] due to insurance. She does continue on Victoza and metformin. She is on losartan and verapamil for microalbuminuria.  An After Visit Summary was printed and given to the patient.  FOLLOW UP: Return in about 3 months (around 10/15/2021) for routine chronic illness f/u.  Signed:  Crissie Sickles, MD           07/15/2021

## 2021-07-27 ENCOUNTER — Other Ambulatory Visit: Payer: Self-pay | Admitting: Family Medicine

## 2021-07-28 ENCOUNTER — Other Ambulatory Visit: Payer: Self-pay | Admitting: Family Medicine

## 2021-07-28 NOTE — Progress Notes (Shared)
[facility] Note  08/09/2021     CHIEF COMPLAINT Patient presents for No chief complaint on file.  HISTORY OF PRESENT ILLNESS: Gloria Gomez is a 51 y.o. female who presents to the clinic today for:    Pt states vision has been stable, the past few days OD has felt like it may be more blurry    Referring physician:   HISTORICAL INFORMATION:   Selected notes from the MEDICAL RECORD NUMBER Referred for DM exam   CURRENT MEDICATIONS: No current outpatient medications on file. (Ophthalmic Drugs)   No current facility-administered medications for this visit. (Ophthalmic Drugs)   Current Outpatient Medications (Other)  Medication Sig   amLODipine (NORVASC) 5 MG tablet Take 1 tablet (5 mg total) by mouth daily.   Continuous Blood Gluc Sensor (FREESTYLE LIBRE 3 SENSOR) MISC USE ONE SENSOR EVERY 14 DAYS FOR 28 DAYS   FARXIGA 10 MG TABS tablet Take 10 mg by mouth daily.   furosemide (LASIX) 20 MG tablet TAKE 2 TABLETS BY MOUTH IN THE MORNING AND 1 IN THE EVENING   gabapentin (NEURONTIN) 300 MG capsule TAKE 1 CAPSULE BY MOUTH THREE TIMES DAILY   HYDROcodone-acetaminophen (NORCO/VICODIN) 5-325 MG tablet Take 1-2 tablets by mouth every 6 (six) hours as needed for moderate pain.   insulin aspart (NOVOLOG) 100 UNIT/ML injection Inject into the skin 3 (three) times daily before meals. Per endocrinologist   Insulin Glargine (BASAGLAR KWIKPEN) 100 UNIT/ML 15-18 units   losartan (COZAAR) 100 MG tablet Take 1 tablet (100 mg total) by mouth daily.   metFORMIN (GLUCOPHAGE) 1000 MG tablet TAKE 1 TABLET BY MOUTH TWICE DAILY WITH MEALS   metoCLOPramide (REGLAN) 5 MG tablet TAKE 1 TABLET BY MOUTH 4 TIMES DAILY BEFORE MEAL(S) AND AT BEDTIME   metoprolol succinate (TOPROL-XL) 100 MG 24 hr tablet Take 2 tablets by mouth once daily   Multiple Vitamin (MULTIVITAMIN WITH MINERALS) TABS tablet Take 1 tablet by mouth daily.   pantoprazole (PROTONIX) 40 MG tablet TAKE 1 TABLET  BY MOUTH ONCE DAILY .   promethazine (PHENERGAN) 12.5 MG tablet TAKE 1 TO 2 TABLETS BY MOUTH EVERY 6 HOURS AS NEEDED FOR NAUSEA   rosuvastatin (CRESTOR) 10 MG tablet Take 1 tablet (10 mg total) by mouth daily.   verapamil (CALAN-SR) 240 MG CR tablet Take 1 tablet (240 mg total) by mouth at bedtime.   VICTOZA 18 MG/3ML SOPN INJECT 1.$RemoveBefor'2MG'pSlkYvJJMotF$  INTO THE SKIN DAILY   No current facility-administered medications for this visit. (Other)   REVIEW OF SYSTEMS:   ALLERGIES Allergies  Allergen Reactions   Gluten Meal Swelling   Lisinopril Cough   Pioglitazone Other (See Comments)    ELEVATED glucoses + worse chronic nausea   PAST MEDICAL HISTORY Past Medical History:  Diagnosis Date   Abdominal bloating    likely from diab gastroparesis.  Dr. Havery Moros started trial of reglan 03/2019.   Benign brain tumor ([place])    Cystic lesion in cerebral aqueduct region with mild hydrocephalus-- stable MRI 02/2016.  Surveillance MRI 05/2017 --dilated cerebral aqueduct related to aqueductal stenosis and subsequent mild hydrocephalus (due to the 11 mm stable cystic lesion in cerebral aqueduct---?congenitial?.   Cataract    OU   Dysmenorrhea    vicodin occ during first 2 days of cycle.   Fibromyalgia    Gluten intolerance    pt reports she underwent full GI w/u to r/o celiac dz   Hepatic steatosis    ultrasound 08/2017. Hx of very  mild elevation of ALT.  Stable on u/s 06/2020   History of adenomatous polyp of colon 04/08/2019   recall Feb 2024   Hyperlipidemia, mixed    Hypertension    +white coat component   Hypertensive retinopathy of both eyes    Insomnia    Iron deficiency 01/2019   Hb 11.3. Hemoccults neg x 3 03/19/19. EGD and colonoscopy 04/08/19 showed NO cause for IDA.  Pt does have menorrhagia, though, so she'll see her GYN.  started FeSO4 325 qd approx 04/14/19.   Menorrhagia    resulting in IDA 2021   PMR (polymyalgia rheumatica) ([place]) 2022   question of; hx of elevated ESR-->better with prednisone  but prednisone was contraindicated d/t eye issues/hyperglycemia-->rheum started following her 02/2021->plaquenil 04/2021   Proliferative diabetic retinopathy of both eyes ([place])    steroid injections 10/2017--improved   Sensorineural hearing loss of left ear    Sudden left hearing loss summer 2016--no improvement with steroids 01/2015 so brain MRI done by Dr. Redmond Baseman and it showed brain tumor that was determined to be benign.  Pt's hearing not bad enough for hearing aid as of 06/2016.   Type 2 diabetes with complication (HCC)    +microalbuminuria, diab retpthy, diabetic gastroparesis (gastric emptying study mildly abnl 03/2017).  Recommended lantus 08/2018 but pt declined. Mild microalbuminuria.   Uterine fibroid 2022   per pt report, pelvic u/s in GYN office   Past Surgical History:  Procedure Laterality Date   ANOSCOPY  05/12/2019   Procedure: normal exam, minimal hemorrhoid disease. Hyertrophied anal papila, benign appearing, posterior midline. Surgeon: Leighton Ruff MD   CHOLECYSTECTOMY  2000   COLONOSCOPY  04/08/2019   5 adenomas, recall 3 yrs; no cause for IDA found.  Hypertrophied anal papillae->bx showed low grade dysplasia; GI referred her to colorectal surgeon.   ESOPHAGOGASTRODUODENOSCOPY  04/08/2019   mild chronic reactive gastritis. H pylori NEG.  No cause for IDA found.   GAS INSERTION Right 10/31/2018   Procedure: Insertion Of C3F8 Gas;  Surgeon: Bernarda Caffey, MD;  Location: [facility];  Service: Ophthalmology;  Laterality: Right;   GASTRIC EMPTYING SCAN  04/20/2017   Mildly abnormal, particularly the 1st hour of emptying.   MEMBRANE PEEL Right 10/31/2018   Procedure: MEMBRANE PEEL;  Surgeon: Bernarda Caffey, MD;  Location: [facility];  Service: Ophthalmology;  Laterality: Right;   PARS PLANA VITRECTOMY Right 04/12/2018   Procedure: Right PARS PLANA VITRECTOMY WITH 25 GAUGE with intravitreal antibiotics;  Surgeon: Bernarda Caffey, MD;  Location: [facility];  Service: Ophthalmology;  Laterality: Right;    PARS PLANA VITRECTOMY Right 10/31/2018   Procedure: PARS PLANA VITRECTOMY WITH 25 GAUGE;  Surgeon: Bernarda Caffey, MD;  Location: [facility];  Service: Ophthalmology;  Laterality: Right;   PHOTOCOAGULATION WITH LASER Right 10/31/2018   Procedure: Photocoagulation With Laser;  Surgeon: Bernarda Caffey, MD;  Location: [facility];  Service: Ophthalmology;  Laterality: Right;   TRANSTHORACIC ECHOCARDIOGRAM  08/23/2020   Grd I DD, o/w normal.   FAMILY HISTORY Family History  Problem Relation Age of Onset   Brain cancer Mother    Diabetes Father    Diabetes Maternal Grandmother    Cataracts Maternal Grandmother    Cervical cancer Paternal Grandmother    Colon cancer Maternal Grandfather 47   Amblyopia Neg Hx    Blindness Neg Hx    Glaucoma Neg Hx    Macular degeneration Neg Hx    Retinal detachment Neg Hx    Strabismus Neg Hx    Retinitis  pigmentosa Neg Hx    Esophageal cancer Neg Hx    Stomach cancer Neg Hx    Rectal cancer Neg Hx    SOCIAL HISTORY Social History   Tobacco Use   Smoking status: Never   Smokeless tobacco: Never  Vaping Use   Vaping Use: Never used  Substance Use Topics   Alcohol use: No   Drug use: No       OPHTHALMIC EXAM:  Not recorded    IMAGING AND PROCEDURES  Imaging and Procedures for $RemoveBefore'@TODAY'qipJIhXEoIZkU$ @          ASSESSMENT/PLAN:   ICD-10-CM   1. Proliferative diabetic retinopathy of both eyes with macular edema associated with type 2 diabetes mellitus ([place])  W62.0355     2. Vitreous hemorrhage of left eye (HCC)  H43.12     3. Right endophthalmia  H44.001     4. Vitreous hemorrhage, right eye ([place])  H43.11     5. Essential hypertension  I10     6. Hypertensive retinopathy of both eyes  H35.033     7. Combined forms of age-related cataract of left eye  H25.812     8. Pseudophakia of right eye  Z96.1       1.  Proliferative diabetic retinopathy w/ DME, OU  - HbA1c 7.1% (3.23.23), 8.6% (9.14.22), 7.8% (06.13.22), 9.1% (02.24.22) 11.0% (11.22.21)  -  s/p IVA OD #1 9.20.19, #2 (10.25.19), #3 (11.15.19), #4 (12.17.19), #5 (01.14.20), #6 (2.11.20), #7 (05.29.20), #8 (08.19.20), #9 (10.30.20), #10 (12.09.20), #11 (01.11.21), #12 (02.15.21), #13 (03.23.21), #14 (04.20.21), #15 (06.08.21), #16 (07.06.21) -- IVA resistance  - s/p IVA OS #1 9.27.19, #2 (10.25.19), #3 (11.15.19), #4 (12.17.19), #5 (01.14.20), #6 (2.11.20), #7 (04.26.20), #8 (05.29.20), #9 (06.26.20), #10 (08.05.20), #11 (11.11.20), #12 (12.09.20), #13 (01.11.21), #14 (02.15.21), #15 (03.23.21), #16 (04.20.21), #17 (06.08.21) -- IVA resistance, #18 (09.27.21)  - s/p IVE OD #1 (08.03.21) -- sample, #2 (09.10.21), #3 (10.08.21), #4 (11.23.21), #5 (12.21.21), #6 (01.19.22) sample, #7 (2.16.22), #8 (3.22.22), #9 (04.19.22), #10 (05.27.22), #11 (06.29.22), #12 (08.03.22), #13 (09.07.22), #14 (10.10.22), #15 (11.14.22), #16 (12.12.22), #17 (01.10.23) - sample, #18 (02.07.23), #19 (03.21.23), #20 (04.18.23)  - s/p IVE OS #1 (10.25.21), #2 (11.23.21), #3 (12.21.21), #4 (3.22.22), #5 (06.29.22), #6 (08.03.22), #7 (09.07.22), #8 (10.10.22), #9 (11.14.22), #10 (02.07.23)  - S/P PRP OS (09.20.19), (5.19.20), (08.19.20), (04.28.21), (02.02.22)  - S/P PRP OD (9.27.19 and 11.21.19), fill-in (04.14.20) (09.03.20, surgery)  - S/P focal laser OS (07.06.21), OD (09.20.22)  - FA (9.20.19) shows +NVE OU and leaking MA and capillary nonperfusion  - repeat FA 11.15.19 shows NV regressing OU  - pre-op: OD w/ VA stable at 20/25, but there is some preretinal fibrosis / tractional membranes just superior to disc and mild central DME  - s/p 25g PPV+MP+10% C3F8 gas OD (09.03.20) -- ERM/PRF removal OD  - BCVA 20/25 OD - improved, 20/25 OS - stable             - fibrosis/ERM stably improved; retina attached  - OCT shows OD: Persistent IRF temporal macula and fovea -- slightly increased; OS: Interval re-development of IRF greatest temporal macula and fovea at 4 wks  - recommend IVE OU today (OD #21 and OS #19), 05.16.23 --  has [id number]  - **OS may need q18m treatments**  - pt in agreement  - RBA of procedure discussed, questions answered - see procedure note  - Eylea informed consent form re-signed and scanned on 05.16.2023  - f/u 4 weeks, DFE, OCT, possible  injection  2. Vitreous hemorrhage OS -- stably improved  - recurrent VH, onset 09.22.21  - etiology: secondary to PDR as described above (no RT/RD on exam)  - s/p PRP OS (9.20.19), (05.19.20), (08.19.20), (04.28.21), (02.02.22)  - s/p IVA OS on 4.26.20, 5.29.20, 6.26.20, 8.5.20,11.11.20, 12.06.20 and so on as above  - VH clear centrally and settled inferiorly -- now white  - BCVA stable at 20/25  - f/u 4 weeks DFE, OCT, possible injection  3. History of Endophthalmitis OD  - s/p IVA OU 04/09/2018  - s/p 25g PPV w/ intravitreal vanc, ceftaz and cefepime OD, 2.14.2020  - s/p intravitreal tap / vanc and ceflaz injections (02.16.20)             - gram stain (2.14.20) shows G+ cocci, WBCs mostly PMNs;   - repeat gram stain from t/i (2.16.20) -- no organisms, just WBCs             - cultures from vitreous grew rare Staph warneri; cultures from t/i -- no growth             - doing well, BCVA 20/25             - inflammation/posterior debris resolved             - IOP 17 off Brimonidine  - completed po pred taper -- caused significant elevations in BG  - monitor  4. History of Vitreous Hemorrhage OD -- cleared from PPV x2 for endophthalmitis and ERM/preretinal fibrosis  - secondary to PDR  5,6. Hypertensive retinopathy OU  - discussed importance of tight BP control.  - monitor  7. Combined form age related cataract OS-   - The symptoms of cataract, surgical options, and treatments and risks were discussed with patient.  - discussed diagnosis and progression  8. Pseudophakia OD  - s/p CE/IOL OD (Dr. Zenia Resides, 12.11.20)  - beautiful surgery, doing well  Ophthalmic Meds Ordered this visit:  No orders of the defined types were placed in this  encounter.    No follow-ups on file.  There are no Patient Instructions on file for this visit.  This document serves as a record of services personally performed by Gardiner Sleeper, MD, PhD. It was created on their behalf by Renaldo Reel, [place] an ophthalmic technician. The creation of this record is the provider's dictation and/or activities during the visit.    Electronically signed by:  Renaldo Reel, COT  07/28/21  9:51 AM   Gardiner Sleeper, M.D., Ph.D. Diseases & Surgery of the Retina and Vitreous [facility]: M myopia (nearsighted); A astigmatism; H hyperopia (farsighted); P presbyopia; Mrx spectacle prescription;  CTL contact lenses; OD right eye; OS left eye; OU both eyes  XT exotropia; ET esotropia; PEK punctate epithelial keratitis; PEE punctate epithelial erosions; DES dry eye syndrome; MGD meibomian gland dysfunction; ATs artificial tears; PFAT's preservative free artificial tears; [place] nuclear sclerotic cataract; PSC posterior subcapsular cataract; ERM epi-retinal membrane; PVD posterior vitreous detachment; RD retinal detachment; DM diabetes mellitus; DR diabetic retinopathy; NPDR non-proliferative diabetic retinopathy; PDR proliferative diabetic retinopathy; CSME clinically significant macular edema; DME diabetic macular edema; dbh dot blot hemorrhages; CWS cotton wool spot; POAG primary open angle glaucoma; C/D cup-to-disc ratio; HVF humphrey visual field; GVF goldmann visual field; OCT optical coherence tomography; IOP intraocular pressure; BRVO Branch retinal vein occlusion; CRVO central retinal vein occlusion; CRAO central retinal artery occlusion; BRAO branch retinal artery occlusion;  RT retinal tear; SB scleral buckle; PPV pars plana vitrectomy; VH Vitreous hemorrhage; PRP panretinal laser photocoagulation; IVK intravitreal kenalog; VMT vitreomacular traction; MH Macular hole;  NVD neovascularization of the disc; NVE  neovascularization elsewhere; AREDS age related eye disease study; ARMD age related macular degeneration; POAG primary open angle glaucoma; EBMD epithelial/anterior basement membrane dystrophy; ACIOL anterior chamber intraocular lens; IOL intraocular lens; PCIOL posterior chamber intraocular lens; Phaco/IOL phacoemulsification with intraocular lens placement; [place] photorefractive keratectomy; LASIK laser assisted in situ keratomileusis; HTN hypertension; DM diabetes mellitus; COPD chronic obstructive pulmonary disease

## 2021-07-29 ENCOUNTER — Ambulatory Visit (INDEPENDENT_AMBULATORY_CARE_PROVIDER_SITE_OTHER): Payer: 59

## 2021-07-29 DIAGNOSIS — E7800 Pure hypercholesterolemia, unspecified: Secondary | ICD-10-CM

## 2021-07-29 DIAGNOSIS — I10 Essential (primary) hypertension: Secondary | ICD-10-CM

## 2021-07-30 LAB — LIPID PANEL
Cholesterol: 138 mg/dL (ref <200)
HDL: 37 mg/dL — ABNORMAL LOW (ref >=50)
LDL Cholesterol (Calc): 66 mg/dL (calc)
Non-HDL Cholesterol (Calc): 101 mg/dL (calc) (ref <130)
Total CHOL/HDL Ratio: 3.7 (calc) (ref <5.0)
Triglycerides: 266 mg/dL — ABNORMAL HIGH (ref <150)

## 2021-07-30 LAB — COMPREHENSIVE METABOLIC PANEL
AG Ratio: 1.5 (calc) (ref 1.0–2.5)
ALT: 39 U/L — ABNORMAL HIGH (ref 6–29)
AST: 33 U/L (ref 10–35)
Albumin: 3.7 g/dL (ref 3.6–5.1)
Alkaline phosphatase (APISO): 98 U/L (ref 37–153)
BUN: 18 mg/dL (ref 7–25)
CO2: 22 mmol/L (ref 20–32)
Calcium: 9.2 mg/dL (ref 8.6–10.4)
Chloride: 107 mmol/L (ref 98–110)
Creat: 0.76 mg/dL (ref 0.50–1.03)
Globulin: 2.5 g/dL (calc) (ref 1.9–3.7)
Glucose, Bld: 163 mg/dL — ABNORMAL HIGH (ref 65–99)
Potassium: 4.9 mmol/L (ref 3.5–5.3)
Sodium: 141 mmol/L (ref 135–146)
Total Bilirubin: 0.2 mg/dL (ref 0.2–1.2)
Total Protein: 6.2 g/dL (ref 6.1–8.1)

## 2021-08-02 ENCOUNTER — Encounter: Payer: Self-pay | Admitting: Family Medicine

## 2021-08-04 DIAGNOSIS — Z6831 Body mass index (BMI) 31.0-31.9, adult: Secondary | ICD-10-CM | POA: Diagnosis not present

## 2021-08-04 DIAGNOSIS — Z01419 Encounter for gynecological examination (general) (routine) without abnormal findings: Secondary | ICD-10-CM | POA: Diagnosis not present

## 2021-08-04 DIAGNOSIS — Z1231 Encounter for screening mammogram for malignant neoplasm of breast: Secondary | ICD-10-CM | POA: Diagnosis not present

## 2021-08-04 LAB — HM MAMMOGRAPHY

## 2021-08-09 ENCOUNTER — Encounter (INDEPENDENT_AMBULATORY_CARE_PROVIDER_SITE_OTHER): Payer: 59 | Admitting: Ophthalmology

## 2021-08-09 DIAGNOSIS — H44001 Unspecified purulent endophthalmitis, right eye: Secondary | ICD-10-CM

## 2021-08-09 DIAGNOSIS — I10 Essential (primary) hypertension: Secondary | ICD-10-CM

## 2021-08-09 DIAGNOSIS — Z961 Presence of intraocular lens: Secondary | ICD-10-CM

## 2021-08-09 DIAGNOSIS — E113513 Type 2 diabetes mellitus with proliferative diabetic retinopathy with macular edema, bilateral: Secondary | ICD-10-CM

## 2021-08-09 DIAGNOSIS — H35033 Hypertensive retinopathy, bilateral: Secondary | ICD-10-CM

## 2021-08-09 DIAGNOSIS — H4312 Vitreous hemorrhage, left eye: Secondary | ICD-10-CM

## 2021-08-09 DIAGNOSIS — H25812 Combined forms of age-related cataract, left eye: Secondary | ICD-10-CM

## 2021-08-09 DIAGNOSIS — H4311 Vitreous hemorrhage, right eye: Secondary | ICD-10-CM

## 2021-08-09 NOTE — Progress Notes (Signed)
[facility] Note  08/12/2021     CHIEF COMPLAINT Patient presents for Retina Follow Up  HISTORY OF PRESENT ILLNESS: Gloria Gomez is a 51 y.o. female who presents to the clinic today for:  HPI     Retina Follow Up   Patient presents with  Diabetic Retinopathy (IVE OU (05.16.23)).  In both eyes.  This started months ago.  Duration of 4 weeks.  I, the attending physician,  performed the HPI with the patient and updated documentation appropriately.        Comments   Patient feels that the vision is a little less clear than normal. She denies noticing flashes or floaters. Her blood sugar 138 and her A1C 7.1.      Last edited by Bernarda Caffey, MD on 08/12/2021  3:24 PM.    Pt states vision has been stable, the past few days OD has felt like it may be more blurry  Referring physician:   HISTORICAL INFORMATION:   Selected notes from the MEDICAL RECORD NUMBER Referred for DM exam   CURRENT MEDICATIONS: No current outpatient medications on file. (Ophthalmic Drugs)   No current facility-administered medications for this visit. (Ophthalmic Drugs)   Current Outpatient Medications (Other)  Medication Sig   meloxicam (MOBIC) 15 MG tablet 1 tablet as needed   amLODipine (NORVASC) 5 MG tablet Take 1 tablet (5 mg total) by mouth daily.   Continuous Blood Gluc Sensor (FREESTYLE LIBRE 3 SENSOR) MISC USE ONE SENSOR EVERY 14 DAYS FOR 28 DAYS   FARXIGA 10 MG TABS tablet Take 10 mg by mouth daily.   furosemide (LASIX) 20 MG tablet TAKE 2 TABLETS BY MOUTH IN THE MORNING AND 1 IN THE EVENING   gabapentin (NEURONTIN) 300 MG capsule TAKE 1 CAPSULE BY MOUTH THREE TIMES DAILY   HYDROcodone-acetaminophen (NORCO/VICODIN) 5-325 MG tablet Take 1-2 tablets by mouth every 6 (six) hours as needed for moderate pain.   insulin aspart (NOVOLOG) 100 UNIT/ML injection Inject into the skin 3 (three) times daily before meals. Per endocrinologist   Insulin Glargine (BASAGLAR  KWIKPEN) 100 UNIT/ML 15-18 units   losartan (COZAAR) 100 MG tablet Take 1 tablet (100 mg total) by mouth daily.   metFORMIN (GLUCOPHAGE) 1000 MG tablet Take 1 tablet (1,000 mg total) by mouth 2 (two) times daily with a meal.   metoCLOPramide (REGLAN) 5 MG tablet TAKE 1 TABLET BY MOUTH 4 TIMES DAILY BEFORE MEAL(S) AND AT BEDTIME   metoprolol succinate (TOPROL-XL) 100 MG 24 hr tablet Take 2 tablets by mouth once daily   Multiple Vitamin (MULTIVITAMIN WITH MINERALS) TABS tablet Take 1 tablet by mouth daily.   pantoprazole (PROTONIX) 40 MG tablet TAKE 1 TABLET BY MOUTH ONCE DAILY .   promethazine (PHENERGAN) 12.5 MG tablet TAKE 1 TO 2 TABLETS BY MOUTH EVERY 6 HOURS AS NEEDED FOR NAUSEA   rosuvastatin (CRESTOR) 10 MG tablet Take 1 tablet (10 mg total) by mouth daily.   verapamil (CALAN-SR) 240 MG CR tablet Take 1 tablet (240 mg total) by mouth at bedtime.   VICTOZA 18 MG/3ML SOPN INJECT 1.2MG INTO THE SKIN DAILY   No current facility-administered medications for this visit. (Other)   REVIEW OF SYSTEMS: ROS   Positive for: Endocrine, Eyes Negative for: Constitutional, Gastrointestinal, Neurological, Skin, Genitourinary, Musculoskeletal, HENT, Cardiovascular, Respiratory, Psychiatric, Allergic/Imm, Heme/Lymph Last edited by Annie Paras, COT on 08/12/2021  1:13 PM.      ALLERGIES Allergies  Allergen Reactions   Gluten Meal Swelling  Lisinopril Cough   Pioglitazone Other (See Comments)    ELEVATED glucoses + worse chronic nausea   PAST MEDICAL HISTORY Past Medical History:  Diagnosis Date   Abdominal bloating    likely from diab gastroparesis.  Dr. Havery Moros started trial of reglan 03/2019.   Benign brain tumor ([place])    Cystic lesion in cerebral aqueduct region with mild hydrocephalus-- stable MRI 02/2016.  Surveillance MRI 05/2017 --dilated cerebral aqueduct related to aqueductal stenosis and subsequent mild hydrocephalus (due to the 11 mm stable cystic lesion in cerebral  aqueduct---?congenitial?.   Cataract    OU   Dysmenorrhea    vicodin occ during first 2 days of cycle.   Fibromyalgia    Gluten intolerance    pt reports she underwent full GI w/u to r/o celiac dz   Hepatic steatosis    ultrasound 08/2017. Hx of very mild elevation of ALT.  Stable on u/s 06/2020   History of adenomatous polyp of colon 04/08/2019   recall Feb 2024   Hyperlipidemia, mixed    Hypertension    +white coat component   Hypertensive retinopathy of both eyes    Insomnia    Iron deficiency 01/2019   Hb 11.3. Hemoccults neg x 3 03/19/19. EGD and colonoscopy 04/08/19 showed NO cause for IDA.  Pt does have menorrhagia, though, so she'll see her GYN.  started FeSO4 325 qd approx 04/14/19.   Menorrhagia    resulting in IDA 2021   PMR (polymyalgia rheumatica) ([place]) 2022   question of; hx of elevated ESR-->better with prednisone but prednisone was contraindicated d/t eye issues/hyperglycemia-->rheum started following her 02/2021->plaquenil 04/2021   Proliferative diabetic retinopathy of both eyes ([place])    steroid injections 10/2017--improved   Sensorineural hearing loss of left ear    Sudden left hearing loss summer 2016--no improvement with steroids 01/2015 so brain MRI done by Dr. Redmond Baseman and it showed brain tumor that was determined to be benign.  Pt's hearing not bad enough for hearing aid as of 06/2016.   Type 2 diabetes with complication (HCC)    +microalbuminuria, diab retpthy, diabetic gastroparesis (gastric emptying study mildly abnl 03/2017).  Recommended lantus 08/2018 but pt declined. Mild microalbuminuria.   Uterine fibroid 2022   per pt report, pelvic u/s in GYN office   Past Surgical History:  Procedure Laterality Date   ANOSCOPY  05/12/2019   Procedure: normal exam, minimal hemorrhoid disease. Hyertrophied anal papila, benign appearing, posterior midline. Surgeon: Leighton Ruff MD   CHOLECYSTECTOMY  2000   COLONOSCOPY  04/08/2019   5 adenomas, recall 3 yrs; no cause for IDA  found.  Hypertrophied anal papillae->bx showed low grade dysplasia; GI referred her to colorectal surgeon.   ESOPHAGOGASTRODUODENOSCOPY  04/08/2019   mild chronic reactive gastritis. H pylori NEG.  No cause for IDA found.   GAS INSERTION Right 10/31/2018   Procedure: Insertion Of C3F8 Gas;  Surgeon: Bernarda Caffey, MD;  Location: [facility];  Service: Ophthalmology;  Laterality: Right;   GASTRIC EMPTYING SCAN  04/20/2017   Mildly abnormal, particularly the 1st hour of emptying.   MEMBRANE PEEL Right 10/31/2018   Procedure: MEMBRANE PEEL;  Surgeon: Bernarda Caffey, MD;  Location: [facility];  Service: Ophthalmology;  Laterality: Right;   PARS PLANA VITRECTOMY Right 04/12/2018   Procedure: Right PARS PLANA VITRECTOMY WITH 25 GAUGE with intravitreal antibiotics;  Surgeon: Bernarda Caffey, MD;  Location: [facility];  Service: Ophthalmology;  Laterality: Right;   PARS PLANA VITRECTOMY Right 10/31/2018   Procedure: PARS PLANA VITRECTOMY  WITH 25 GAUGE;  Surgeon: Bernarda Caffey, MD;  Location: [facility];  Service: Ophthalmology;  Laterality: Right;   PHOTOCOAGULATION WITH LASER Right 10/31/2018   Procedure: Photocoagulation With Laser;  Surgeon: Bernarda Caffey, MD;  Location: [facility];  Service: Ophthalmology;  Laterality: Right;   TRANSTHORACIC ECHOCARDIOGRAM  08/23/2020   Grd I DD, o/w normal.   FAMILY HISTORY Family History  Problem Relation Age of Onset   Brain cancer Mother    Diabetes Father    Diabetes Maternal Grandmother    Cataracts Maternal Grandmother    Cervical cancer Paternal Grandmother    Colon cancer Maternal Grandfather 23   Amblyopia Neg Hx    Blindness Neg Hx    Glaucoma Neg Hx    Macular degeneration Neg Hx    Retinal detachment Neg Hx    Strabismus Neg Hx    Retinitis pigmentosa Neg Hx    Esophageal cancer Neg Hx    Stomach cancer Neg Hx    Rectal cancer Neg Hx    SOCIAL HISTORY Social History   Tobacco Use   Smoking status: Never   Smokeless tobacco: Never  Vaping Use   Vaping Use:  Never used  Substance Use Topics   Alcohol use: No   Drug use: No       OPHTHALMIC EXAM:  Base Eye Exam     Visual Acuity (Snellen - Linear)       Right Left   Dist [place] 20/25    Dist cc  20/25 +1         Tonometry (Tonopen, 1:16 PM)       Right Left   Pressure 13 10         Pupils       Pupils Dark Light Shape React APD   Right PERRL 3 2 Round Brisk None   Left PERRL 3 2 Round Brisk None         Visual Fields       Left Right    Full Full         Extraocular Movement       Right Left    Full, Ortho Full, Ortho         Neuro/Psych     Oriented x3: Yes   Mood/Affect: Normal         Dilation     Both eyes: 1.0% Mydriacyl, 2.5% Phenylephrine @ 1:15 PM           Slit Lamp and Fundus Exam     Slit Lamp Exam       Right Left   Lids/Lashes Dermatochalasis - upper lid, mild Meibomian gland dysfunction, Telangiectasia Dermatochalasis - upper lid, Meibomian gland dysfunction, Telangiectasia   Conjunctiva/Sclera White and quiet White and quiet   Cornea Clear, well healed temporal cataract wounds Trace Punctate epithelial erosions   Anterior Chamber Deep and quiet Deep and quiet   Iris Round and dilated, No NVI Round and dilated, No NVI   Lens PC IOL in good position, 1-2+ Posterior capsular opacification, PC folds 2-3+ Nuclear sclerosis, 2-3+ Cortical cataract   Anterior Vitreous post vitrectomy, trace pigment Vitreous syneresis, blood stained vitreous condensations settled inferiorly and white -- improving, no red heme         Fundus Exam       Right Left   Disc mild Pallor, Sharp rim, temporal PPA Pink and sharp, Compact, PPA   C/D Ratio 0.2 0.0   Macula Flat, good foveal reflex, mild cystic changes --  persistent / slightly increased, punctate exudate - improved, scattered MA ST mac -- improved, +focal laser changes Flat, good foveal reflex, trace cystic changes - improved, scattered MA; trace ERM, light focal laser changes, no heme    Vessels attenuated, Tortuous attenuated, mild Tortuousity   Periphery Attached, rare MA, scattered DBH greatest superiorly - improved, 360 PRP, good laser fill in 360 attached, scattered IRH; 360 PRP scars -- with good fill in changes           Refraction     Wearing Rx       Sphere Cylinder   Right -3.75 Sphere   Left -3.75 Sphere           IMAGING AND PROCEDURES  Imaging and Procedures for   OCT, Retina - OU - Both Eyes       Right Eye Quality was good. Central Foveal Thickness: 349. Progression has worsened. Findings include no SRF, abnormal foveal contour, intraretinal hyper-reflective material, epiretinal membrane, intraretinal fluid (Persistent IRF temporal macula and fovea -- slightly increased in some areas).   Left Eye Quality was good. Central Foveal Thickness: 281. Progression has improved. Findings include normal foveal contour, no IRF, no SRF, intraretinal hyper-reflective material (Interval resolution of IRF/cystic changes ).   Notes *Images captured and stored on drive  Diagnosis / Impression:  DME OU OD: Persistent IRF temporal macula and fovea -- slightly increased in some areas OS: Interval resolution of IRF/cystic changes   Clinical management:  See below  Abbreviations: NFP - Normal foveal profile. CME - cystoid macular edema. PED - pigment epithelial detachment. IRF - intraretinal fluid. SRF - subretinal fluid. EZ - ellipsoid zone. ERM - epiretinal membrane. ORA - outer retinal atrophy. ORT - outer retinal tubulation. SRHM - subretinal hyper-reflective material       Intravitreal Injection, Pharmacologic Agent - OD - Right Eye       Time Out 08/12/2021. 1:57 PM. Confirmed correct patient, procedure, site, and patient consented.   Anesthesia Topical anesthesia was used. Anesthetic medications included Lidocaine 2%, Proparacaine 0.5%.   Procedure Preparation included 5% betadine to ocular surface, eyelid speculum. A (32g) needle was used.    Injection: 6 mg faricimab-svoa 6 MG/0.05ML   Route: Intravitreal, Site: Right Eye   NDC: [phone], Lot: [id number], Expiration date: 09/27/2022, Waste: 0 mL   Post-op Post injection exam found visual acuity of at least counting fingers. The patient tolerated the procedure well. There were no complications. The patient received written and verbal post procedure care education. Post injection medications were not given.   Notes **SAMPLE MEDICATION ADMINISTERED**  NDC [phone]           ASSESSMENT/PLAN:   ICD-10-CM   1. Proliferative diabetic retinopathy of both eyes with macular edema associated with type 2 diabetes mellitus (HCC)  E11.3513 OCT, Retina - OU - Both Eyes    Intravitreal Injection, Pharmacologic Agent - OD - Right Eye    faricimab-svoa (VABYSMO) 72m/0.05mL intravitreal injection    2. Vitreous hemorrhage of left eye (HCC)  H43.12     3. Right endophthalmia  H44.001     4. Vitreous hemorrhage, right eye (HCampti  H43.11     5. Essential hypertension  I10     6. Hypertensive retinopathy of both eyes  H35.033     7. Combined forms of age-related cataract of left eye  H25.812     8. Pseudophakia of right eye  Z96.1      1.  Proliferative diabetic retinopathy w/ DME,  OU  - HbA1c 7.1% (3.23.23), 8.6% (9.14.22), 7.8% (06.13.22), 9.1% (02.24.22) 11.0% (11.22.21)  - s/p IVA OD #1 9.20.19, #2 (10.25.19), #3 (11.15.19), #4 (12.17.19), #5 (01.14.20), #6 (2.11.20), #7 (05.29.20), #8 (08.19.20), #9 (10.30.20), #10 (12.09.20), #11 (01.11.21), #12 (02.15.21), #13 (03.23.21), #14 (04.20.21), #15 (06.08.21), #16 (07.06.21) -- IVA resistance  - s/p IVA OS #1 9.27.19, #2 (10.25.19), #3 (11.15.19), #4 (12.17.19), #5 (01.14.20), #6 (2.11.20), #7 (04.26.20), #8 (05.29.20), #9 (06.26.20), #10 (08.05.20), #11 (11.11.20), #12 (12.09.20), #13 (01.11.21), #14 (02.15.21), #15 (03.23.21), #16 (04.20.21), #17 (06.08.21) -- IVA resistance, #18 (09.27.21)  - s/p IVE OD #1 (08.03.21) --  sample, #2 (09.10.21), #3 (10.08.21), #4 (11.23.21), #5 (12.21.21), #6 (01.19.22) sample, #7 (2.16.22), #8 (3.22.22), #9 (04.19.22), #10 (05.27.22), #11 (06.29.22), #12 (08.03.22), #13 (09.07.22), #14 (10.10.22), #15 (11.14.22), #16 (12.12.22), #17 (01.10.23) - sample, #18 (02.07.23), #19 (03.21.23), #20 (04.18.23), #21 (05.16.23)  - s/p IVE OS #1 (10.25.21), #2 (11.23.21), #3 (12.21.21), #4 (3.22.22), #5 (06.29.22), #6 (08.03.22), #7 (09.07.22), #8 (10.10.22), #9 (11.14.22), #10 (02.07.23), #11 (05.16.23)  - S/P PRP OS (09.20.19), (5.19.20), (08.19.20), (04.28.21), (02.02.22)  - S/P PRP OD (9.27.19 and 11.21.19), fill-in (04.14.20) (09.03.20, surgery)  - S/P focal laser OS (07.06.21), OD (09.20.22)  - FA (9.20.19) shows +NVE OU and leaking MA and capillary nonperfusion  - repeat FA 11.15.19 shows NV regressing OU  - pre-op: OD w/ VA stable at 20/25, but there is some preretinal fibrosis / tractional membranes just superior to disc and mild central DME  - s/p 25g PPV+MP+10% C3F8 gas OD (09.03.20) -- ERM/PRF removal OD  - BCVA 20/25 OD - improved, 20/25 OS - stable             - fibrosis/ERM stably improved; retina attached  - OCT shows OD: Persistent IRF temporal macula and fovea -- slightly increased in some areas; OS: Interval resolution of IRF/cystic changes at 4 wks  - discussed possible IVE resistance and switch in medication  - recommend IVV OD #1 [sample] today, 06.16.23  - **OS may need q10mtreatments**  - pt in agreement  - RBA of procedure discussed, questions answered - see procedure note  - Eylea informed consent form re-signed and scanned on 05.16.2023  - Vabysmo informed consent form signed on 06.16.23  - f/u 4 weeks, DFE, OCT, possible injection  2. Vitreous hemorrhage OS -- stably improved  - recurrent VH, onset 09.22.21  - etiology: secondary to PDR as described above (no RT/RD on exam)  - s/p PRP OS (9.20.19), (05.19.20), (08.19.20), (04.28.21), (02.02.22)  - s/p IVA OS on  4.26.20, 5.29.20, 6.26.20, 8.5.20,11.11.20, 12.06.20 and so on as above   - VH clear centrally and settled inferiorly -- now white  - BCVA stable at 20/25  - f/u 4 weeks DFE, OCT, possible injection  3. History of Endophthalmitis OD  - s/p IVA OU 04/09/2018  - s/p 25g PPV w/ intravitreal vanc, ceftaz and cefepime OD, 2.14.2020  - s/p intravitreal tap / vanc and ceflaz injections (02.16.20)             - gram stain (2.14.20) shows G+ cocci, WBCs mostly PMNs;   - repeat gram stain from t/i (2.16.20) -- no organisms, just WBCs             - cultures from vitreous grew rare Staph warneri; cultures from t/i -- no growth             - doing well, BCVA 20/25             -  inflammation/posterior debris resolved             - IOP 13 off Brimonidine  - completed po pred taper -- caused significant elevations in BG  - monitor   4. History of Vitreous Hemorrhage OD -- cleared from PPV x2 for endophthalmitis and ERM/preretinal fibrosis   - secondary to PDR  5,6. Hypertensive retinopathy OU  - discussed importance of tight BP control.  - monitor   7. Combined form age related cataract OS-   - The symptoms of cataract, surgical options, and treatments and risks were discussed with patient.   - discussed diagnosis and progression  8. Pseudophakia OD  - s/p CE/IOL OD (Dr. Zenia Resides, 12.11.20)  - beautiful surgery, doing well  Ophthalmic Meds Ordered this visit:  Meds ordered this encounter  Medications   faricimab-svoa (VABYSMO) 82m/0.05mL intravitreal injection     Return in about 4 weeks (around 09/09/2021) for DFE, OCT, possible injection.  There are no Patient Instructions on file for this visit.  This document serves as a record of services personally performed by BGardiner Sleeper MD, PhD. It was created on their behalf by RLeonie Douglas an ophthalmic technician. The creation of this record is the provider's dictation and/or activities during the visit.    Electronically signed by: RLeonie DouglasCOA, 08/12/21  3:24 PM  BGardiner Sleeper M.D., Ph.D. Diseases & Surgery of the Retina and Vitreous [facility] I have reviewed the above documentation for accuracy and completeness, and I agree with the above. BGardiner Sleeper M.D., Ph.D. 08/12/21 3:27 PM  Abbreviations: M myopia (nearsighted); A astigmatism; H hyperopia (farsighted); P presbyopia; Mrx spectacle prescription;  CTL contact lenses; OD right eye; OS left eye; OU both eyes  XT exotropia; ET esotropia; PEK punctate epithelial keratitis; PEE punctate epithelial erosions; DES dry eye syndrome; MGD meibomian gland dysfunction; ATs artificial tears; PFAT's preservative free artificial tears; NGlasgownuclear sclerotic cataract; PSC posterior subcapsular cataract; ERM epi-retinal membrane; PVD posterior vitreous detachment; RD retinal detachment; DM diabetes mellitus; DR diabetic retinopathy; NPDR non-proliferative diabetic retinopathy; PDR proliferative diabetic retinopathy; CSME clinically significant macular edema; DME diabetic macular edema; dbh dot blot hemorrhages; CWS cotton wool spot; POAG primary open angle glaucoma; C/D cup-to-disc ratio; HVF humphrey visual field; GVF goldmann visual field; OCT optical coherence tomography; IOP intraocular pressure; BRVO Branch retinal vein occlusion; CRVO central retinal vein occlusion; CRAO central retinal artery occlusion; BRAO branch retinal artery occlusion; RT retinal tear; SB scleral buckle; PPV pars plana vitrectomy; VH Vitreous hemorrhage; PRP panretinal laser photocoagulation; IVK intravitreal kenalog; VMT vitreomacular traction; MH Macular hole;  NVD neovascularization of the disc; NVE neovascularization elsewhere; AREDS age related eye disease study; ARMD age related macular degeneration; POAG primary open angle glaucoma; EBMD epithelial/anterior basement membrane dystrophy; ACIOL anterior chamber intraocular lens; IOL intraocular lens; PCIOL posterior chamber  intraocular lens; Phaco/IOL phacoemulsification with intraocular lens placement; PWrightstownphotorefractive keratectomy; LASIK laser assisted in situ keratomileusis; HTN hypertension; DM diabetes mellitus; COPD chronic obstructive pulmonary disease

## 2021-08-11 DIAGNOSIS — M797 Fibromyalgia: Secondary | ICD-10-CM | POA: Diagnosis not present

## 2021-08-11 DIAGNOSIS — Z6831 Body mass index (BMI) 31.0-31.9, adult: Secondary | ICD-10-CM | POA: Diagnosis not present

## 2021-08-11 DIAGNOSIS — E669 Obesity, unspecified: Secondary | ICD-10-CM | POA: Diagnosis not present

## 2021-08-11 DIAGNOSIS — M353 Polymyalgia rheumatica: Secondary | ICD-10-CM | POA: Diagnosis not present

## 2021-08-11 DIAGNOSIS — R700 Elevated erythrocyte sedimentation rate: Secondary | ICD-10-CM | POA: Diagnosis not present

## 2021-08-12 ENCOUNTER — Encounter (INDEPENDENT_AMBULATORY_CARE_PROVIDER_SITE_OTHER): Payer: Self-pay | Admitting: Ophthalmology

## 2021-08-12 ENCOUNTER — Ambulatory Visit (INDEPENDENT_AMBULATORY_CARE_PROVIDER_SITE_OTHER): Payer: 59 | Admitting: Ophthalmology

## 2021-08-12 DIAGNOSIS — E113513 Type 2 diabetes mellitus with proliferative diabetic retinopathy with macular edema, bilateral: Secondary | ICD-10-CM | POA: Diagnosis not present

## 2021-08-12 DIAGNOSIS — H4313 Vitreous hemorrhage, bilateral: Secondary | ICD-10-CM

## 2021-08-12 DIAGNOSIS — H4312 Vitreous hemorrhage, left eye: Secondary | ICD-10-CM

## 2021-08-12 DIAGNOSIS — H4311 Vitreous hemorrhage, right eye: Secondary | ICD-10-CM

## 2021-08-12 DIAGNOSIS — H25812 Combined forms of age-related cataract, left eye: Secondary | ICD-10-CM

## 2021-08-12 DIAGNOSIS — H35033 Hypertensive retinopathy, bilateral: Secondary | ICD-10-CM

## 2021-08-12 DIAGNOSIS — H44001 Unspecified purulent endophthalmitis, right eye: Secondary | ICD-10-CM

## 2021-08-12 DIAGNOSIS — I10 Essential (primary) hypertension: Secondary | ICD-10-CM

## 2021-08-12 DIAGNOSIS — Z961 Presence of intraocular lens: Secondary | ICD-10-CM

## 2021-08-12 MED ORDER — FARICIMAB-SVOA 6 MG/0.05ML IZ SOLN
6.0000 mg | INTRAVITREAL | Status: AC | PRN
  Administered 2021-08-12: 6 mg via INTRAVITREAL

## 2021-09-09 NOTE — Progress Notes (Signed)
[facility] Note  09/12/2021     CHIEF COMPLAINT Patient presents for Retina Follow Up  HISTORY OF PRESENT ILLNESS: Gloria Gomez is a 51 y.o. female who presents to the clinic today for:  HPI     Retina Follow Up   Patient presents with  Diabetic Retinopathy.  In both eyes.  Duration of 4 weeks.  Since onset it is stable.  I, the attending physician,  performed the HPI with the patient and updated documentation appropriately.        Comments   4 week follow up PDR OU- She had been having a hard time with her insurance company paying for her insulin.  She has been getting a 10 day supply instead of 30.  She has tried to get everyone to understand without success.  She has tried to make the insulin last.  Her BS has been up to 300 because of this. Her insurance finally decided to pay q2 weeks.       Last edited by Bernarda Caffey, MD on 09/12/2021  4:06 PM.    Pt states her insurance will only pay for her insulin every 2 weeks, she states her blood sugars have been up bc of this (high 200's / low 300's), she states her left eye feels weak, no fol or floaters  Referring physician:   HISTORICAL INFORMATION:   Selected notes from the MEDICAL RECORD NUMBER Referred for DM exam   CURRENT MEDICATIONS: No current outpatient medications on file. (Ophthalmic Drugs)   No current facility-administered medications for this visit. (Ophthalmic Drugs)   Current Outpatient Medications (Other)  Medication Sig   amLODipine (NORVASC) 5 MG tablet Take 1 tablet (5 mg total) by mouth daily.   FARXIGA 10 MG TABS tablet Take 10 mg by mouth daily.   furosemide (LASIX) 20 MG tablet TAKE 2 TABLETS BY MOUTH IN THE MORNING AND 1 IN THE EVENING   gabapentin (NEURONTIN) 300 MG capsule TAKE 1 CAPSULE BY MOUTH THREE TIMES DAILY   HYDROcodone-acetaminophen (NORCO/VICODIN) 5-325 MG tablet Take 1-2 tablets by mouth every 6 (six) hours as needed for moderate pain.   insulin  aspart (NOVOLOG) 100 UNIT/ML injection Inject into the skin 3 (three) times daily before meals. Per endocrinologist   Insulin Glargine (BASAGLAR KWIKPEN) 100 UNIT/ML 15-18 units   losartan (COZAAR) 100 MG tablet Take 1 tablet (100 mg total) by mouth daily.   meloxicam (MOBIC) 15 MG tablet 1 tablet as needed   metFORMIN (GLUCOPHAGE) 1000 MG tablet Take 1 tablet (1,000 mg total) by mouth 2 (two) times daily with a meal.   metoCLOPramide (REGLAN) 5 MG tablet TAKE 1 TABLET BY MOUTH 4 TIMES DAILY BEFORE MEAL(S) AND AT BEDTIME   metoprolol succinate (TOPROL-XL) 100 MG 24 hr tablet Take 2 tablets by mouth once daily   Multiple Vitamin (MULTIVITAMIN WITH MINERALS) TABS tablet Take 1 tablet by mouth daily.   pantoprazole (PROTONIX) 40 MG tablet TAKE 1 TABLET BY MOUTH ONCE DAILY .   promethazine (PHENERGAN) 12.5 MG tablet TAKE 1 TO 2 TABLETS BY MOUTH EVERY 6 HOURS AS NEEDED FOR NAUSEA   rosuvastatin (CRESTOR) 10 MG tablet Take 1 tablet (10 mg total) by mouth daily.   verapamil (CALAN-SR) 240 MG CR tablet Take 1 tablet (240 mg total) by mouth at bedtime.   VICTOZA 18 MG/3ML SOPN INJECT 1.2MG INTO THE SKIN DAILY   Continuous Blood Gluc Sensor (FREESTYLE LIBRE 3 SENSOR) MISC USE ONE SENSOR EVERY 14 DAYS  FOR 28 DAYS   No current facility-administered medications for this visit. (Other)   REVIEW OF SYSTEMS: ROS   Positive for: Endocrine, Eyes Negative for: Constitutional, Gastrointestinal, Neurological, Skin, Genitourinary, Musculoskeletal, HENT, Cardiovascular, Respiratory, Psychiatric, Allergic/Imm, Heme/Lymph Last edited by Leonie Douglas, COA on 09/12/2021  1:49 PM.       ALLERGIES Allergies  Allergen Reactions   Gluten Meal Swelling   Lisinopril Cough   Pioglitazone Other (See Comments)    ELEVATED glucoses + worse chronic nausea   PAST MEDICAL HISTORY Past Medical History:  Diagnosis Date   Abdominal bloating    likely from diab gastroparesis.  Dr. Havery Moros started trial of reglan  03/2019.   Benign brain tumor ([place])    Cystic lesion in cerebral aqueduct region with mild hydrocephalus-- stable MRI 02/2016.  Surveillance MRI 05/2017 --dilated cerebral aqueduct related to aqueductal stenosis and subsequent mild hydrocephalus (due to the 11 mm stable cystic lesion in cerebral aqueduct---?congenitial?.   Cataract    OU   Dysmenorrhea    vicodin occ during first 2 days of cycle.   Fibromyalgia    Gluten intolerance    pt reports she underwent full GI w/u to r/o celiac dz   Hepatic steatosis    ultrasound 08/2017. Hx of very mild elevation of ALT.  Stable on u/s 06/2020   History of adenomatous polyp of colon 04/08/2019   recall Feb 2024   Hyperlipidemia, mixed    Hypertension    +white coat component   Hypertensive retinopathy of both eyes    Insomnia    Iron deficiency 01/2019   Hb 11.3. Hemoccults neg x 3 03/19/19. EGD and colonoscopy 04/08/19 showed NO cause for IDA.  Pt does have menorrhagia, though, so she'll see her GYN.  started FeSO4 325 qd approx 04/14/19.   Menorrhagia    resulting in IDA 2021   PMR (polymyalgia rheumatica) ([place]) 2022   question of; hx of elevated ESR-->better with prednisone but prednisone was contraindicated d/t eye issues/hyperglycemia-->rheum started following her 02/2021->plaquenil 04/2021   Proliferative diabetic retinopathy of both eyes ([place])    steroid injections 10/2017--improved   Sensorineural hearing loss of left ear    Sudden left hearing loss summer 2016--no improvement with steroids 01/2015 so brain MRI done by Dr. Redmond Baseman and it showed brain tumor that was determined to be benign.  Pt's hearing not bad enough for hearing aid as of 06/2016.   Type 2 diabetes with complication (HCC)    +microalbuminuria, diab retpthy, diabetic gastroparesis (gastric emptying study mildly abnl 03/2017).  Recommended lantus 08/2018 but pt declined. Mild microalbuminuria.   Uterine fibroid 2022   per pt report, pelvic u/s in GYN office   Past Surgical History:   Procedure Laterality Date   ANOSCOPY  05/12/2019   Procedure: normal exam, minimal hemorrhoid disease. Hyertrophied anal papila, benign appearing, posterior midline. Surgeon: Leighton Ruff MD   CHOLECYSTECTOMY  2000   COLONOSCOPY  04/08/2019   5 adenomas, recall 3 yrs; no cause for IDA found.  Hypertrophied anal papillae->bx showed low grade dysplasia; GI referred her to colorectal surgeon.   ESOPHAGOGASTRODUODENOSCOPY  04/08/2019   mild chronic reactive gastritis. H pylori NEG.  No cause for IDA found.   GAS INSERTION Right 10/31/2018   Procedure: Insertion Of C3F8 Gas;  Surgeon: Bernarda Caffey, MD;  Location: [place];  Service: Ophthalmology;  Laterality: Right;   GASTRIC EMPTYING SCAN  04/20/2017   Mildly abnormal, particularly the 1st hour of emptying.   MEMBRANE PEEL Right 10/31/2018  Procedure: MEMBRANE PEEL;  Surgeon: Bernarda Caffey, MD;  Location: [place];  Service: Ophthalmology;  Laterality: Right;   PARS PLANA VITRECTOMY Right 04/12/2018   Procedure: Right PARS PLANA VITRECTOMY WITH 25 GAUGE with intravitreal antibiotics;  Surgeon: Bernarda Caffey, MD;  Location: [place];  Service: Ophthalmology;  Laterality: Right;   PARS PLANA VITRECTOMY Right 10/31/2018   Procedure: PARS PLANA VITRECTOMY WITH 25 GAUGE;  Surgeon: Bernarda Caffey, MD;  Location: [place];  Service: Ophthalmology;  Laterality: Right;   PHOTOCOAGULATION WITH LASER Right 10/31/2018   Procedure: Photocoagulation With Laser;  Surgeon: Bernarda Caffey, MD;  Location: [place];  Service: Ophthalmology;  Laterality: Right;   TRANSTHORACIC ECHOCARDIOGRAM  08/23/2020   Grd I DD, o/w normal.   FAMILY HISTORY Family History  Problem Relation Age of Onset   Brain cancer Mother    Diabetes Father    Diabetes Maternal Grandmother    Cataracts Maternal Grandmother    Cervical cancer Paternal Grandmother    Colon cancer Maternal Grandfather 61   Amblyopia Neg Hx    Blindness Neg Hx    Glaucoma Neg Hx    Macular degeneration Neg Hx     Retinal detachment Neg Hx    Strabismus Neg Hx    Retinitis pigmentosa Neg Hx    Esophageal cancer Neg Hx    Stomach cancer Neg Hx    Rectal cancer Neg Hx    SOCIAL HISTORY Social History   Tobacco Use   Smoking status: Never   Smokeless tobacco: Never  Vaping Use   Vaping Use: Never used  Substance Use Topics   Alcohol use: No   Drug use: No       OPHTHALMIC EXAM:  Base Eye Exam     Visual Acuity (Snellen - Linear)       Right Left   Dist [place] 20/25 +1    Dist cc  20/40 +1   Dist ph cc  20/40 +2         Tonometry (Tonopen, 1:56 PM)       Right Left   Pressure 14 14         Pupils       Dark Light Shape React APD   Right 3 2 Round Brisk None   Left 3 2 Round Brisk None         Visual Fields (Counting fingers)       Left Right    Full Full         Extraocular Movement       Right Left    Full Full         Neuro/Psych     Oriented x3: Yes   Mood/Affect: Normal         Dilation     Both eyes: 1.0% Mydriacyl, 2.5% Phenylephrine @ 1:56 PM           Slit Lamp and Fundus Exam     Slit Lamp Exam       Right Left   Lids/Lashes Dermatochalasis - upper lid, mild Meibomian gland dysfunction, Telangiectasia Dermatochalasis - upper lid, Meibomian gland dysfunction, Telangiectasia   Conjunctiva/Sclera White and quiet White and quiet   Cornea Clear, well healed temporal cataract wounds Trace Punctate epithelial erosions, trace EBMD   Anterior Chamber Deep and quiet Deep and quiet   Iris Round and dilated, No NVI Round and dilated, No NVI   Lens PC IOL in good position, 1-2+ Posterior capsular opacification, PC folds 2-3+ Nuclear sclerosis  with mild brunescence, 2-3+ Cortical cataract, 1+ Posterior subcapsular cataract   Anterior Vitreous post vitrectomy, trace pigment Vitreous syneresis, mild vitreous condensations clearing centrally and settling inferiorly         Fundus Exam       Right Left   Disc mild Pallor, Sharp rim,  temporal PPA Pink and sharp, Compact, PPA   C/D Ratio 0.2 0.0   Macula Flat, blunted foveal reflex, mild cystic changes -- persistent, punctate exudate - persistent, scattered MA ST mac -- improved, +focal laser changes Flat, good foveal reflex, trace cystic changes - improved, scattered MA; trace ERM, light focal laser changes, no edema   Vessels attenuated, mild tortuosity attenuated, mild Tortuousity   Periphery Attached, rare MA, scattered DBH greatest posteriorly, 360 PRP, good laser fill in 360 attached, scattered IRH; 360 PRP scars -- with good fill in changes           Refraction     Wearing Rx       Sphere Cylinder   Right -3.75 Sphere   Left -3.75 Sphere         Manifest Refraction       Sphere Cylinder Axis Dist VA   Right       Left -4.25 +0.50 015 20/25-1           IMAGING AND PROCEDURES  Imaging and Procedures for   OCT, Retina - OU - Both Eyes       Right Eye Quality was good. Central Foveal Thickness: 333. Progression has improved. Findings include no SRF, abnormal foveal contour, intraretinal hyper-reflective material, epiretinal membrane, intraretinal fluid (Mild interval improvement in IRF temporal macula and fovea ).   Left Eye Quality was good. Central Foveal Thickness: 276. Progression has improved. Findings include normal foveal contour, no IRF, no SRF, intraretinal hyper-reflective material (Stable resolution of central IRF/cystic changes, interval improvement in vitreous opacities ).   Notes *Images captured and stored on drive  Diagnosis / Impression:  DME OU OD: Mild interval improvement in IRF temporal macula and fovea  OS: Stable resolution of central IRF/cystic changes, interval improvement in vitreous opacities   Clinical management:  See below  Abbreviations: NFP - Normal foveal profile. CME - cystoid macular edema. PED - pigment epithelial detachment. IRF - intraretinal fluid. SRF - subretinal fluid. EZ - ellipsoid zone. ERM -  epiretinal membrane. ORA - outer retinal atrophy. ORT - outer retinal tubulation. SRHM - subretinal hyper-reflective material       Intravitreal Injection, Pharmacologic Agent - OD - Right Eye       Time Out 09/12/2021. 2:58 PM. Confirmed correct patient, procedure, site, and patient consented.   Anesthesia Topical anesthesia was used. Anesthetic medications included Lidocaine 2%, Proparacaine 0.5%.   Procedure Preparation included 5% betadine to ocular surface, eyelid speculum. A (32g) needle was used.   Injection: 6 mg faricimab-svoa 6 MG/0.05ML   Route: Intravitreal, Site: Right Eye   NDC: [phone], Lot: [id number], Expiration date: 03/30/2023, Waste: 0 mL   Post-op Post injection exam found visual acuity of at least counting fingers. The patient tolerated the procedure well. There were no complications. The patient received written and verbal post procedure care education. Post injection medications were not given.            ASSESSMENT/PLAN:   ICD-10-CM   1. Proliferative diabetic retinopathy of both eyes with macular edema associated with type 2 diabetes mellitus (HCC)  N82.9562 OCT, Retina - OU - Both Eyes    Intravitreal  Injection, Pharmacologic Agent - OD - Right Eye    faricimab-svoa (VABYSMO) 74m/0.05mL intravitreal injection    2. Vitreous hemorrhage of left eye (HCC)  H43.12     3. Right endophthalmia  H44.001     4. Vitreous hemorrhage, right eye (HOnycha  H43.11     5. Essential hypertension  I10     6. Hypertensive retinopathy of both eyes  H35.033     7. Combined forms of age-related cataract of left eye  H25.812     8. Pseudophakia of right eye  Z96.1       1.  Proliferative diabetic retinopathy w/ DME, OU  - HbA1c 7.1% (3.23.23), 8.6% (9.14.22), 7.8% (06.13.22), 9.1% (02.24.22) 11.0% (11.22.21)  - s/p IVA OD #1 9.20.19, #2 (10.25.19), #3 (11.15.19), #4 (12.17.19), #5 (01.14.20), #6 (2.11.20), #7 (05.29.20), #8 (08.19.20), #9 (10.30.20), #10  (12.09.20), #11 (01.11.21), #12 (02.15.21), #13 (03.23.21), #14 (04.20.21), #15 (06.08.21), #16 (07.06.21) -- IVA resistance  - s/p IVA OS #1 9.27.19, #2 (10.25.19), #3 (11.15.19), #4 (12.17.19), #5 (01.14.20), #6 (2.11.20), #7 (04.26.20), #8 (05.29.20), #9 (06.26.20), #10 (08.05.20), #11 (11.11.20), #12 (12.09.20), #13 (01.11.21), #14 (02.15.21), #15 (03.23.21), #16 (04.20.21), #17 (06.08.21) -- IVA resistance, #18 (09.27.21)  - s/p IVE OD #1 (08.03.21) -- sample, #2 (09.10.21), #3 (10.08.21), #4 (11.23.21), #5 (12.21.21), #6 (01.19.22) sample, #7 (2.16.22), #8 (3.22.22), #9 (04.19.22), #10 (05.27.22), #11 (06.29.22), #12 (08.03.22), #13 (09.07.22), #14 (10.10.22), #15 (11.14.22), #16 (12.12.22), #17 (01.10.23) - sample, #18 (02.07.23), #19 (03.21.23), #20 (04.18.23), #21 (05.16.23) -- IVE resistance  - s/p IVE OS #1 (10.25.21), #2 (11.23.21), #3 (12.21.21), #4 (3.22.22), #5 (06.29.22), #6 (08.03.22), #7 (09.07.22), #8 (10.10.22), #9 (11.14.22), #10 (02.07.23), #11 (05.16.23)  - IVV OD #1 (06.16.23 -- sample)  - S/P PRP OS (09.20.19), (5.19.20), (08.19.20), (04.28.21), (02.02.22)  - S/P PRP OD (9.27.19 and 11.21.19), fill-in (04.14.20) (09.03.20, surgery)  - S/P focal laser OS (07.06.21), OD (09.20.22)  - FA (9.20.19) shows +NVE OU and leaking MA and capillary nonperfusion  - repeat FA 11.15.19 shows NV regressing OU  - pre-op: OD w/ VA stable at 20/25, but there is some preretinal fibrosis / tractional membranes just superior to disc and mild central DME  - s/p 25g PPV+MP+10% C3F8 gas OD (09.03.20) -- ERM/PRF removal OD  - BCVA 20/25 OD - stable; OS 20/40 from 20/25              - fibrosis/ERM stably improved; retina attached  - OCT shows OD: Mild interval improvement in IRF temporal macula and fovea; OS: Stable resolution of central IRF/cystic changes, interval improvement in vitreous opacities  - recommend IVV OD #2  today, 07.17.23  - **OS may need q340mreatments**  - pt in agreement  - RBA of  procedure discussed, questions answered - see procedure note  - E[place]a informed consent form re-signed and scanned on 05.16.2023  - Vabysmo informed consent form signed on 06.16.23  - f/u 4 weeks, DFE, OCT, possible injection  2. Vitreous hemorrhage OS -- stably improved  - recurrent VH, onset 09.22.21  - etiology: secondary to PDR as described above (no RT/RD on exam)  - s/p PRP OS (9.20.19), (05.19.20), (08.19.20), (04.28.21), (02.02.22)  - s/p IVA OS on 4.26.20, 5.29.20, 6.26.20, 8.5.20,11.11.20, 12.06.20 and so on as above   - VH clear centrally and settled inferiorly -- now white  - BCVA decreased to 20/40 from 20/25  - f/u 4 weeks DFE, OCT  3. History of Endophthalmitis OD  - s/p IVA OU  04/09/2018  - s/p 25g PPV w/ intravitreal vanc, ceftaz and cefepime OD, 2.14.2020  - s/p intravitreal tap / vanc and ceflaz injections (02.16.20)             - gram stain (2.14.20) shows G+ cocci, WBCs mostly PMNs;   - repeat gram stain from t/i (2.16.20) -- no organisms, just WBCs             - cultures from vitreous grew rare Staph warneri; cultures from t/i -- no growth             - doing well, BCVA 20/25             - inflammation/posterior debris resolved             - IOP 14 off Brimonidine  - completed po pred taper -- caused significant elevations in BG  - monitor  4. History of Vitreous Hemorrhage OD -- cleared from PPV x2 for endophthalmitis and ERM/preretinal fibrosis   - secondary to PDR  5,6. Hypertensive retinopathy OU  - discussed importance of tight BP control.  - monitor  7. Combined form age related cataract OS-   - The symptoms of cataract, surgical options, and treatments and risks were discussed with patient.   - discussed diagnosis and progression  8. Pseudophakia OD  - s/p CE/IOL OD (Dr. Zenia Resides, 12.11.20)  - beautiful surgeries, doing well  Ophthalmic Meds Ordered this visit:  Meds ordered this encounter  Medications   faricimab-svoa (VABYSMO) 80m/0.05mL  intravitreal injection     Return in about 4 weeks (around 10/10/2021) for f/u NPDR OU, DFE, OCT.  There are no Patient Instructions on file for this visit.  This document serves as a record of services personally performed by BGardiner Sleeper MD, PhD. It was created on their behalf by DRoselee Nova COMT. The creation of this record is the provider's dictation and/or activities during the visit.  Electronically signed by: DRoselee Nova COMT 09/12/21 4:07 PM  BGardiner Sleeper M.D., Ph.D. Diseases & Surgery of the Retina and Vitreous [facility] I have reviewed the above documentation for accuracy and completeness, and I agree with the above. BGardiner Sleeper M.D., Ph.D. 09/12/21 4:09 PM   Abbreviations: M myopia (nearsighted); A astigmatism; H hyperopia (farsighted); P presbyopia; Mrx spectacle prescription;  CTL contact lenses; OD right eye; OS left eye; OU both eyes  XT exotropia; ET esotropia; PEK punctate epithelial keratitis; PEE punctate epithelial erosions; DES dry eye syndrome; MGD meibomian gland dysfunction; ATs artificial tears; PFAT's preservative free artificial tears; [place] cataract; PSC posterior subcapsular cataract; ERM epi-retinal membrane; PVD posterior vitreous detachment; RD retinal detachment; DM diabetes mellitus; DR diabetic retinopathy; NPDR non-proliferative diabetic retinopathy; PDR proliferative diabetic retinopathy; CSME clinically significant macular edema; DME diabetic macular edema; dbh dot blot hemorrhages; CWS cotton wool spot; POAG primary open angle glaucoma; C/D cup-to-disc ratio; HVF humphrey visual field; GVF goldmann visual field; OCT optical coherence tomography; IOP intraocular pressure; BRVO Branch retinal vein occlusion; CRVO central retinal vein occlusion; CRAO central retinal artery occlusion; BRAO branch retinal artery occlusion; RT retinal tear; SB scleral buckle; PPV pars plana vitrectomy; VH Vitreous  hemorrhage; PRP panretinal laser photocoagulation; IVK intravitreal kenalog; VMT vitreomacular traction; MH Macular hole;  NVD neovascularization of the disc; NVE neovascularization elsewhere; AREDS age related eye disease study; ARMD age related macular degeneration; POAG primary open angle glaucoma; EBMD epithelial/anterior basement membrane dystrophy; ACIOL anterior chamber intraocular lens; IOL  intraocular lens; PCIOL posterior chamber intraocular lens; Phaco/IOL phacoemulsification with intraocular lens placement; [place] photorefractive keratectomy; LASIK laser assisted in situ keratomileusis; HTN hypertension; DM diabetes mellitus; COPD chronic obstructive pulmonary disease

## 2021-09-12 ENCOUNTER — Ambulatory Visit (INDEPENDENT_AMBULATORY_CARE_PROVIDER_SITE_OTHER): Payer: 59 | Admitting: Ophthalmology

## 2021-09-12 ENCOUNTER — Encounter (INDEPENDENT_AMBULATORY_CARE_PROVIDER_SITE_OTHER): Payer: Self-pay | Admitting: Ophthalmology

## 2021-09-12 DIAGNOSIS — H4311 Vitreous hemorrhage, right eye: Secondary | ICD-10-CM

## 2021-09-12 DIAGNOSIS — H4312 Vitreous hemorrhage, left eye: Secondary | ICD-10-CM

## 2021-09-12 DIAGNOSIS — H4313 Vitreous hemorrhage, bilateral: Secondary | ICD-10-CM | POA: Diagnosis not present

## 2021-09-12 DIAGNOSIS — Z961 Presence of intraocular lens: Secondary | ICD-10-CM

## 2021-09-12 DIAGNOSIS — H44001 Unspecified purulent endophthalmitis, right eye: Secondary | ICD-10-CM

## 2021-09-12 DIAGNOSIS — I10 Essential (primary) hypertension: Secondary | ICD-10-CM | POA: Diagnosis not present

## 2021-09-12 DIAGNOSIS — H35033 Hypertensive retinopathy, bilateral: Secondary | ICD-10-CM

## 2021-09-12 DIAGNOSIS — H25812 Combined forms of age-related cataract, left eye: Secondary | ICD-10-CM

## 2021-09-12 DIAGNOSIS — E113513 Type 2 diabetes mellitus with proliferative diabetic retinopathy with macular edema, bilateral: Secondary | ICD-10-CM | POA: Diagnosis not present

## 2021-09-12 MED ORDER — FARICIMAB-SVOA 6 MG/0.05ML IZ SOLN
6.0000 mg | INTRAVITREAL | Status: AC | PRN
  Administered 2021-09-12: 6 mg via INTRAVITREAL

## 2021-09-15 ENCOUNTER — Telehealth: Payer: Self-pay

## 2021-09-15 NOTE — Telephone Encounter (Signed)
Spoke with pt to confirm if mammogram has been completed. Pt states she had one completed with GYN office in May this year. Requesting records for HM update

## 2021-09-19 DIAGNOSIS — I10 Essential (primary) hypertension: Secondary | ICD-10-CM | POA: Diagnosis not present

## 2021-09-19 DIAGNOSIS — E785 Hyperlipidemia, unspecified: Secondary | ICD-10-CM | POA: Diagnosis not present

## 2021-09-19 DIAGNOSIS — E1169 Type 2 diabetes mellitus with other specified complication: Secondary | ICD-10-CM | POA: Diagnosis not present

## 2021-09-30 ENCOUNTER — Other Ambulatory Visit: Payer: Self-pay | Admitting: Family Medicine

## 2021-10-04 NOTE — Progress Notes (Signed)
[facility] Note  10/11/2021     CHIEF COMPLAINT Patient presents for Retina Follow Up  HISTORY OF PRESENT ILLNESS: Gloria Gomez is a 51 y.o. female who presents to the clinic today for:  HPI     Retina Follow Up   Patient presents with  Diabetic Retinopathy.  In both eyes.  This started 4 weeks ago.  I, the attending physician,  performed the HPI with the patient and updated documentation appropriately.        Comments   Patient here for 4 weeks retina follow up for NPDR OU. Patient states vision about the same. No eye pain.       Last edited by Bernarda Caffey, MD on 10/11/2021  4:08 PM.      Referring physician:   HISTORICAL INFORMATION:   Selected notes from the MEDICAL RECORD NUMBER Referred for DM exam   CURRENT MEDICATIONS: No current outpatient medications on file. (Ophthalmic Drugs)   No current facility-administered medications for this visit. (Ophthalmic Drugs)   Current Outpatient Medications (Other)  Medication Sig   amLODipine (NORVASC) 5 MG tablet Take 1 tablet (5 mg total) by mouth daily.   Continuous Blood Gluc Sensor (FREESTYLE LIBRE 3 SENSOR) MISC USE ONE SENSOR EVERY 14 DAYS FOR 28 DAYS   FARXIGA 10 MG TABS tablet Take 10 mg by mouth daily.   furosemide (LASIX) 20 MG tablet TAKE 2 TABLETS BY MOUTH IN THE MORNING AND 1 IN THE EVENING   gabapentin (NEURONTIN) 300 MG capsule TAKE 1 CAPSULE BY MOUTH THREE TIMES DAILY   HYDROcodone-acetaminophen (NORCO/VICODIN) 5-325 MG tablet Take 1-2 tablets by mouth every 6 (six) hours as needed for moderate pain.   insulin aspart (NOVOLOG) 100 UNIT/ML injection Inject into the skin 3 (three) times daily before meals. Per endocrinologist   Insulin Glargine (BASAGLAR KWIKPEN) 100 UNIT/ML 15-18 units   losartan (COZAAR) 100 MG tablet Take 1 tablet (100 mg total) by mouth daily.   meloxicam (MOBIC) 15 MG tablet 1 tablet as needed   metFORMIN (GLUCOPHAGE) 1000 MG tablet Take 1 tablet  (1,000 mg total) by mouth 2 (two) times daily with a meal.   metoCLOPramide (REGLAN) 5 MG tablet TAKE 1 TABLET BY MOUTH 4 TIMES DAILY BEFORE MEAL(S) AND AT BEDTIME   metoprolol succinate (TOPROL-XL) 100 MG 24 hr tablet Take 2 tablets by mouth once daily   Multiple Vitamin (MULTIVITAMIN WITH MINERALS) TABS tablet Take 1 tablet by mouth daily.   pantoprazole (PROTONIX) 40 MG tablet TAKE 1 TABLET BY MOUTH ONCE DAILY .   promethazine (PHENERGAN) 12.5 MG tablet TAKE 1 TO 2 TABLETS BY MOUTH EVERY 6 HOURS AS NEEDED FOR NAUSEA   rosuvastatin (CRESTOR) 10 MG tablet Take 1 tablet (10 mg total) by mouth daily.   verapamil (CALAN-SR) 240 MG CR tablet Take 1 tablet (240 mg total) by mouth at bedtime.   VICTOZA 18 MG/3ML SOPN INJECT 1.2MG INTO THE SKIN DAILY   No current facility-administered medications for this visit. (Other)   REVIEW OF SYSTEMS: ROS   Positive for: Endocrine, Eyes Negative for: Constitutional, Gastrointestinal, Neurological, Skin, Genitourinary, Musculoskeletal, HENT, Cardiovascular, Respiratory, Psychiatric, Allergic/Imm, Heme/Lymph Last edited by Theodore Demark, COA on 10/11/2021  1:46 PM.     ALLERGIES Allergies  Allergen Reactions   Gluten Meal Swelling   Lisinopril Cough   Pioglitazone Other (See Comments)    ELEVATED glucoses + worse chronic nausea   PAST MEDICAL HISTORY Past Medical History:  Diagnosis Date  Abdominal bloating    likely from diab gastroparesis.  Dr. Armbruster started trial of reglan 03/2019.   Benign brain tumor (HCC)    Cystic lesion in cerebral aqueduct region with mild hydrocephalus-- stable MRI 02/2016.  Surveillance MRI 05/2017 --dilated cerebral aqueduct related to aqueductal stenosis and subsequent mild hydrocephalus (due to the 11 mm stable cystic lesion in cerebral aqueduct---?congenitial?.   Cataract    OU   Dysmenorrhea    vicodin occ during first 2 days of cycle.   Fibromyalgia    Gluten intolerance    pt reports she underwent full GI  w/u to r/o celiac dz   Hepatic steatosis    ultrasound 08/2017. Hx of very mild elevation of ALT.  Stable on u/s 06/2020   History of adenomatous polyp of colon 04/08/2019   recall Feb 2024   Hyperlipidemia, mixed    Hypertension    +white coat component   Hypertensive retinopathy of both eyes    Insomnia    Iron deficiency 01/2019   Hb 11.3. Hemoccults neg x 3 03/19/19. EGD and colonoscopy 04/08/19 showed NO cause for IDA.  Pt does have menorrhagia, though, so she'll see her GYN.  started FeSO4 325 qd approx 04/14/19.   Menorrhagia    resulting in IDA 2021   PMR (polymyalgia rheumatica) (HCC) 2022   question of; hx of elevated ESR-->better with prednisone but prednisone was contraindicated d/t eye issues/hyperglycemia-->rheum started following her 02/2021->plaquenil 04/2021   Proliferative diabetic retinopathy of both eyes (HCC)    steroid injections 10/2017--improved   Sensorineural hearing loss of left ear    Sudden left hearing loss summer 2016--no improvement with steroids 01/2015 so brain MRI done by Dr. Bates and it showed brain tumor that was determined to be benign.  Pt's hearing not bad enough for hearing aid as of 06/2016.   Type 2 diabetes with complication (HCC)    +microalbuminuria, diab retpthy, diabetic gastroparesis (gastric emptying study mildly abnl 03/2017).  Recommended lantus 08/2018 but pt declined. Mild microalbuminuria.   Uterine fibroid 2022   per pt report, pelvic u/s in GYN office   Past Surgical History:  Procedure Laterality Date   ANOSCOPY  05/12/2019   Procedure: normal exam, minimal hemorrhoid disease. Hyertrophied anal papila, benign appearing, posterior midline. Surgeon: Alicia, Thomas MD   CHOLECYSTECTOMY  2000   COLONOSCOPY  04/08/2019   5 adenomas, recall 3 yrs; no cause for IDA found.  Hypertrophied anal papillae->bx showed low grade dysplasia; GI referred her to colorectal surgeon.   ESOPHAGOGASTRODUODENOSCOPY  04/08/2019   mild chronic reactive gastritis.  H pylori NEG.  No cause for IDA found.   GAS INSERTION Right 10/31/2018   Procedure: Insertion Of C3F8 Gas;  Surgeon: Zamora, Brian, MD;  Location: [facility] OR;  Service: Ophthalmology;  Laterality: Right;   GASTRIC EMPTYING SCAN  04/20/2017   Mildly abnormal, particularly the 1st hour of emptying.   MEMBRANE PEEL Right 10/31/2018   Procedure: MEMBRANE PEEL;  Surgeon: Zamora, Brian, MD;  Location: [facility] OR;  Service: Ophthalmology;  Laterality: Right;   PARS PLANA VITRECTOMY Right 04/12/2018   Procedure: Right PARS PLANA VITRECTOMY WITH 25 GAUGE with intravitreal antibiotics;  Surgeon: Zamora, Brian, MD;  Location: [facility] OR;  Service: Ophthalmology;  Laterality: Right;   PARS PLANA VITRECTOMY Right 10/31/2018   Procedure: PARS PLANA VITRECTOMY WITH 25 GAUGE;  Surgeon: Zamora, Brian, MD;  Location: [facility] OR;  Service: Ophthalmology;  Laterality: Right;   PHOTOCOAGULATION WITH LASER Right 10/31/2018   Procedure: Photocoagulation With   Laser;  Surgeon: Zamora, Brian, MD;  Location: [facility] OR;  Service: Ophthalmology;  Laterality: Right;   TRANSTHORACIC ECHOCARDIOGRAM  08/23/2020   Grd I DD, o/w normal.   FAMILY HISTORY Family History  Problem Relation Age of Onset   Brain cancer Mother    Diabetes Father    Diabetes Maternal Grandmother    Cataracts Maternal Grandmother    Cervical cancer Paternal Grandmother    Colon cancer Maternal Grandfather 62   Amblyopia Neg Hx    Blindness Neg Hx    Glaucoma Neg Hx    Macular degeneration Neg Hx    Retinal detachment Neg Hx    Strabismus Neg Hx    Retinitis pigmentosa Neg Hx    Esophageal cancer Neg Hx    Stomach cancer Neg Hx    Rectal cancer Neg Hx    SOCIAL HISTORY Social History   Tobacco Use   Smoking status: Never   Smokeless tobacco: Never  Vaping Use   Vaping Use: Never used  Substance Use Topics   Alcohol use: No   Drug use: No       OPHTHALMIC EXAM:  Base Eye Exam     Visual Acuity (Snellen - Linear)       Right Left   Dist [place] 20/25  -2    Dist cc  20/25 -2   Dist ph [place] NI    Dist ph cc  20/25 +2    Correction: Glasses  OD no glasses, OS glasses        Tonometry (Tonopen, 1:43 PM)       Right Left   Pressure 16 12         Pupils       Dark Light Shape React APD   Right 3 2 Round Brisk None   Left 3 2 Round Brisk None         Visual Fields (Counting fingers)       Left Right    Full Full         Extraocular Movement       Right Left    Full, Ortho Full, Ortho         Neuro/Psych     Oriented x3: Yes   Mood/Affect: Normal         Dilation     Both eyes: 1.0% Mydriacyl, 2.5% Phenylephrine @ 1:42 PM           Slit Lamp and Fundus Exam     Slit Lamp Exam       Right Left   Lids/Lashes Dermatochalasis - upper lid, mild Meibomian gland dysfunction, Telangiectasia Dermatochalasis - upper lid, Meibomian gland dysfunction, Telangiectasia   Conjunctiva/Sclera White and quiet White and quiet   Cornea Clear, well healed temporal cataract wounds Trace Punctate epithelial erosions, trace EBMD   Anterior Chamber Deep and quiet Deep and quiet   Iris Round and dilated, No NVI Round and dilated, No NVI   Lens PC IOL in good position, 1-2+ Posterior capsular opacification, PC folds 2-3+ Nuclear sclerosis with mild brunescence, 2-3+ Cortical cataract, 1+ Posterior subcapsular cataract   Anterior Vitreous post vitrectomy, trace pigment Vitreous syneresis, mild vitreous condensations clearing centrally and settling inferiorly         Fundus Exam       Right Left   Disc mild Pallor, Sharp rim, temporal PPA Pink and sharp, Compact, PPA   C/D Ratio 0.2 0.0   Macula Flat, blunted foveal reflex, mild cystic changes --   persistent, punctate exudate - persistent, scattered MA ST mac -- persistent +focal laser changes Flat, good foveal reflex, trace cystic changes slightly improved, scattered MA; trace ERM, light focal laser changes, no edema   Vessels attenuated, mild tortuosity attenuated,  mild Tortuousity   Periphery Attached, rare MA, scattered DBH greatest posteriorly, 360 PRP, good laser fill in 360 attached, scattered IRH; 360 PRP scars -- with good fill in changes           Refraction     Wearing Rx       Sphere Cylinder   Right -3.75 Sphere   Left -3.75 Sphere           IMAGING AND PROCEDURES  Imaging and Procedures for   OCT, Retina - OU - Both Eyes       Right Eye Quality was good. Central Foveal Thickness: 375. Progression has been stable. Findings include no SRF, abnormal foveal contour, intraretinal hyper-reflective material, epiretinal membrane, intraretinal fluid (Persistent IRF temporal macula and fovea---? Slightly improved).   Left Eye Quality was good. Central Foveal Thickness: 282. Progression has been stable. Findings include normal foveal contour, no IRF, no SRF, intraretinal hyper-reflective material (Stable resolution of central IRF/cystic changes, stable improvement in vitreous opacities ).   Notes *Images captured and stored on drive  Diagnosis / Impression:  DME OU OD: Persistent IRF temporal macula and fovea---? Slightly improved OS: Stable resolution of central IRF/cystic changes, stable improvement in vitreous opacities   Clinical management:  See below  Abbreviations: NFP - Normal foveal profile. CME - cystoid macular edema. PED - pigment epithelial detachment. IRF - intraretinal fluid. SRF - subretinal fluid. EZ - ellipsoid zone. ERM - epiretinal membrane. ORA - outer retinal atrophy. ORT - outer retinal tubulation. SRHM - subretinal hyper-reflective material       Intravitreal Injection, Pharmacologic Agent - OD - Right Eye       Time Out 10/11/2021. 2:52 PM. Confirmed correct patient, procedure, site, and patient consented.   Anesthesia Topical anesthesia was used. Anesthetic medications included Lidocaine 2%, Proparacaine 0.5%.   Procedure Preparation included 5% betadine to ocular surface, eyelid speculum. A  (32g) needle was used.   Injection: 6 mg faricimab-svoa 6 MG/0.05ML   Route: Intravitreal, Site: Right Eye   NDC: 50242-096-01, Lot: [id number], Expiration date: 07/28/2023, Waste: 0 mL   Post-op Post injection exam found visual acuity of at least counting fingers. The patient tolerated the procedure well. There were no complications. The patient received written and verbal post procedure care education. Post injection medications were not given.      Intravitreal Injection, Pharmacologic Agent - OS - Left Eye       Time Out 10/11/2021. 2:52 PM. Confirmed correct patient, procedure, site, and patient consented.   Anesthesia Topical anesthesia was used. Anesthetic medications included Lidocaine 2%, Proparacaine 0.5%.   Procedure Preparation included 5% betadine to ocular surface, eyelid speculum. A (32g) needle was used.   Injection: 2 mg aflibercept 2 MG/0.05ML   Route: Intravitreal, Site: Left Eye   NDC: 61755-005-02, Lot: [id number], Expiration date: 06/27/2022, Waste: 0 mL   Post-op Post injection exam found visual acuity of at least counting fingers. The patient tolerated the procedure well. There were no complications. The patient received written and verbal post procedure care education. Post injection medications were not given.             ASSESSMENT/PLAN:   ICD-10-CM   1. Proliferative diabetic retinopathy of both eyes with macular edema associated with   type 2 diabetes mellitus (HCC)  E11.3513 OCT, Retina - OU - Both Eyes    Intravitreal Injection, Pharmacologic Agent - OD - Right Eye    Intravitreal Injection, Pharmacologic Agent - OS - Left Eye    aflibercept (EYLEA) SOLN 2 mg    faricimab-svoa (VABYSMO) 6mg/0.05mL intravitreal injection    2. Vitreous hemorrhage of left eye (HCC)  H43.12     3. Right endophthalmia  H44.001     4. Vitreous hemorrhage, right eye (HCC)  H43.11     5. Essential hypertension  I10     6. Hypertensive retinopathy of both eyes   H35.033     7. Combined forms of age-related cataract of left eye  H25.812     8. Pseudophakia of right eye  Z96.1      1.  Proliferative diabetic retinopathy w/ DME, OU  - HbA1c 7.1% (3.23.23), 8.6% (9.14.22), 7.8% (06.13.22), 9.1% (02.24.22) 11.0% (11.22.21)  - s/p IVA OD #1 9.20.19, #2 (10.25.19), #3 (11.15.19), #4 (12.17.19), #5 (01.14.20), #6 (2.11.20), #7 (05.29.20), #8 (08.19.20), #9 (10.30.20), #10 (12.09.20), #11 (01.11.21), #12 (02.15.21), #13 (03.23.21), #14 (04.20.21), #15 (06.08.21), #16 (07.06.21) -- IVA resistance  - s/p IVA OS #1 9.27.19, #2 (10.25.19), #3 (11.15.19), #4 (12.17.19), #5 (01.14.20), #6 (2.11.20), #7 (04.26.20), #8 (05.29.20), #9 (06.26.20), #10 (08.05.20), #11 (11.11.20), #12 (12.09.20), #13 (01.11.21), #14 (02.15.21), #15 (03.23.21), #16 (04.20.21), #17 (06.08.21) -- IVA resistance, #18 (09.27.21)  - s/p IVE OD #1 (08.03.21) -- sample, #2 (09.10.21), #3 (10.08.21), #4 (11.23.21), #5 (12.21.21), #6 (01.19.22) sample, #7 (2.16.22), #8 (3.22.22), #9 (04.19.22), #10 (05.27.22), #11 (06.29.22), #12 (08.03.22), #13 (09.07.22), #14 (10.10.22), #15 (11.14.22), #16 (12.12.22), #17 (01.10.23) - sample, #18 (02.07.23), #19 (03.21.23), #20 (04.18.23), #21 (05.16.23) -- IVE resistance  - s/p IVE OS #1 (10.25.21), #2 (11.23.21), #3 (12.21.21), #4 (3.22.22), #5 (06.29.22), #6 (08.03.22), #7 (09.07.22), #8 (10.10.22), #9 (11.14.22), #10 (02.07.23), #11 (05.16.23)  - IVV OD #1 (06.16.23 -- sample), #2 (07.17.23)  - S/P PRP OS (09.20.19), (5.19.20), (08.19.20), (04.28.21), (02.02.22)  - S/P PRP OD (9.27.19 and 11.21.19), fill-in (04.14.20) (09.03.20, surgery)  - S/P focal laser OS (07.06.21), OD (09.20.22)  - FA (9.20.19) shows +NVE OU and leaking MA and capillary nonperfusion  - repeat FA 11.15.19 shows NV regressing OU  - pre-op: OD w/ VA stable at 20/25, but there is some preretinal fibrosis / tractional membranes just superior to disc and mild central DME  - s/p 25g PPV+MP+10%  C3F8 gas OD (09.03.20) -- ERM/PRF removal OD  - BCVA 20/25 OD - stable; OS stable at 20/25             - fibrosis/ERM stably improved; retina attached  - OCT shows OD: peristent IRF temporal macula and fovea; OS: Stable resolution of central IRF/cystic changes, stable improvement in vitreous opacities  - recommend IVV OD #3 and IVE OS today 08.15.23  - **OS on q3m maintenance treatment schedule**  - pt in agreement  - RBA of procedure discussed, questions answered - see procedure note  - Eylea informed consent form re-signed and scanned on 05.16.2023  - Vabysmo informed consent form signed on 06.16.23  - f/u 4 weeks, DFE, OCT, possible injection  2. Vitreous hemorrhage OS -- stably improved  - recurrent VH, onset 09.22.21  - etiology: secondary to PDR as described above (no RT/RD on exam)  - s/p PRP OS (9.20.19), (05.19.20), (08.19.20), (04.28.21), (02.02.22)  - s/p IVA OS on 4.26.20, 5.29.20, 6.26.20, 8.5.20,11.11.20, 12.06.20 and so on as above   -   IVE OS today, 08.15.23 as above  - VH clear centrally and settled inferiorly -- now white  - BCVA stable at 20/25  - f/u 4 weeks DFE, OCT  3. History of Endophthalmitis OD  - s/p IVA OU 04/09/2018  - s/p 25g PPV w/ intravitreal vanc, ceftaz and cefepime OD, 2.14.2020  - s/p intravitreal tap / vanc and ceflaz injections (02.16.20)             - gram stain (2.14.20) shows G+ cocci, WBCs mostly PMNs;   - repeat gram stain from t/i (2.16.20) -- no organisms, just WBCs             - cultures from vitreous grew rare Staph warneri; cultures from t/i -- no growth             - doing well, BCVA 20/25             - inflammation/posterior debris resolved             - IOP 14 off Brimonidine  - completed po pred taper -- caused significant elevations in BG  - monitor  4. History of Vitreous Hemorrhage OD -- cleared from PPV x2 for endophthalmitis and ERM/preretinal fibrosis   - secondary to PDR  5,6. Hypertensive retinopathy OU  - discussed  importance of tight BP control.  - monitor  7. Combined form age related cataract OS-   - The symptoms of cataract, surgical options, and treatments and risks were discussed with patient.   - discussed diagnosis and progression  8. Pseudophakia OD  - s/p CE/IOL OD (Dr. Zenia Resides, 12.11.20)  - beautiful surgeries, doing well  Ophthalmic Meds Ordered this visit:  Meds ordered this encounter  Medications   aflibercept (EYLEA) SOLN 2 mg   faricimab-svoa (VABYSMO) 5m/0.05mL intravitreal injection     Return in about 4 weeks (around 11/08/2021) for DFE, OCT.  There are no Patient Instructions on file for this visit.  This document serves as a record of services personally performed by BGardiner Sleeper MD, PhD. It was created on their behalf by CRenaldo Reel C[place] Ac[place] ophthalmic technician. The creation of this record is the provider's dictation and/or activities during the visit.    Electronically signed by:  CRenaldo Reel COT  10/04/21 4:11 PM  This document serves as a record of services personally performed by BGardiner Sleeper MD, PhD. It was created on their behalf by DRoselee Nova COMT. The creation of this record is the provider's dictation and/or activities during the visit.  Electronically signed by: DRoselee Nova COMT 10/11/21 4:11 PM  BGardiner Sleeper M.D., Ph.D. Diseases & Surgery of the Retina and Vitreous [facility] I have reviewed the above documentation for accuracy and completeness, and I agree with the above. BGardiner Sleeper M.D., Ph.D. 10/11/21 4:16 PM   Abbreviations: M myopia (nearsighted); A astigmatism; H hyperopia (farsighted); P presbyopia; Mrx spectacle prescription;  CTL contact lenses; OD right eye; OS left eye; OU both eyes  XT exotropia; ET esotropia; PEK punctate epithelial keratitis; PEE punctate epithelial erosions; DES dry eye syndrome; MGD meibomian gland dysfunction; ATs artificial tears; PFAT's preservative free  artificial tears; NFreistattnuclear sclerotic cataract; PSC posterior subcapsular cataract; ERM epi-retinal membrane; PVD posterior vitreous detachment; RD retinal detachment; DM diabetes mellitus; DR diabetic retinopathy; NPDR non-proliferative diabetic retinopathy; PDR proliferative diabetic retinopathy; CSME clinically significant macular edema; DME diabetic macular edema; dbh dot blot hemorrhages; CWS cotton wool spot; POAG primary  open angle glaucoma; C/D cup-to-disc ratio; HVF humphrey visual field; GVF goldmann visual field; OCT optical coherence tomography; IOP intraocular pressure; BRVO Branch retinal vein occlusion; CRVO central retinal vein occlusion; CRAO central retinal artery occlusion; BRAO branch retinal artery occlusion; RT retinal tear; SB scleral buckle; PPV pars plana vitrectomy; VH Vitreous hemorrhage; PRP panretinal laser photocoagulation; IVK intravitreal kenalog; VMT vitreomacular traction; MH Macular hole;  NVD neovascularization of the disc; NVE neovascularization elsewhere; AREDS age related eye disease study; ARMD age related macular degeneration; POAG primary open angle glaucoma; EBMD epithelial/anterior basement membrane dystrophy; ACIOL anterior chamber intraocular lens; IOL intraocular lens; PCIOL posterior chamber intraocular lens; Phaco/IOL phacoemulsification with intraocular lens placement; Brooks photorefractive keratectomy; LASIK laser assisted in situ keratomileusis; HTN hypertension; DM diabetes mellitus; COPD chronic obstructive pulmonary disease

## 2021-10-11 ENCOUNTER — Ambulatory Visit (INDEPENDENT_AMBULATORY_CARE_PROVIDER_SITE_OTHER): Payer: 59 | Admitting: Ophthalmology

## 2021-10-11 ENCOUNTER — Encounter (INDEPENDENT_AMBULATORY_CARE_PROVIDER_SITE_OTHER): Payer: Self-pay | Admitting: Ophthalmology

## 2021-10-11 DIAGNOSIS — H4312 Vitreous hemorrhage, left eye: Secondary | ICD-10-CM

## 2021-10-11 DIAGNOSIS — H4311 Vitreous hemorrhage, right eye: Secondary | ICD-10-CM

## 2021-10-11 DIAGNOSIS — H4313 Vitreous hemorrhage, bilateral: Secondary | ICD-10-CM

## 2021-10-11 DIAGNOSIS — Z961 Presence of intraocular lens: Secondary | ICD-10-CM

## 2021-10-11 DIAGNOSIS — E113513 Type 2 diabetes mellitus with proliferative diabetic retinopathy with macular edema, bilateral: Secondary | ICD-10-CM

## 2021-10-11 DIAGNOSIS — H35033 Hypertensive retinopathy, bilateral: Secondary | ICD-10-CM | POA: Diagnosis not present

## 2021-10-11 DIAGNOSIS — H44001 Unspecified purulent endophthalmitis, right eye: Secondary | ICD-10-CM

## 2021-10-11 DIAGNOSIS — H25812 Combined forms of age-related cataract, left eye: Secondary | ICD-10-CM

## 2021-10-11 DIAGNOSIS — I10 Essential (primary) hypertension: Secondary | ICD-10-CM | POA: Diagnosis not present

## 2021-10-11 LAB — HM DIABETES EYE EXAM

## 2021-10-11 MED ORDER — FARICIMAB-SVOA 6 MG/0.05ML IZ SOLN
6.0000 mg | INTRAVITREAL | Status: AC | PRN
  Administered 2021-10-11: 6 mg via INTRAVITREAL

## 2021-10-11 MED ORDER — AFLIBERCEPT 2MG/0.05ML IZ SOLN FOR KALEIDOSCOPE
2.0000 mg | INTRAVITREAL | Status: AC | PRN
  Administered 2021-10-11: 2 mg via INTRAVITREAL

## 2021-10-13 ENCOUNTER — Ambulatory Visit: Payer: 59 | Admitting: Family Medicine

## 2021-10-14 ENCOUNTER — Other Ambulatory Visit: Payer: Self-pay | Admitting: Family Medicine

## 2021-10-19 ENCOUNTER — Encounter: Payer: Self-pay | Admitting: Family Medicine

## 2021-10-19 ENCOUNTER — Ambulatory Visit (INDEPENDENT_AMBULATORY_CARE_PROVIDER_SITE_OTHER): Payer: 59 | Admitting: Family Medicine

## 2021-10-19 VITALS — BP 112/76 | HR 82 | Temp 98.0°F | Ht 63.0 in | Wt 183.4 lb

## 2021-10-19 DIAGNOSIS — Z79899 Other long term (current) drug therapy: Secondary | ICD-10-CM | POA: Diagnosis not present

## 2021-10-19 DIAGNOSIS — E782 Mixed hyperlipidemia: Secondary | ICD-10-CM | POA: Diagnosis not present

## 2021-10-19 DIAGNOSIS — K760 Fatty (change of) liver, not elsewhere classified: Secondary | ICD-10-CM

## 2021-10-19 DIAGNOSIS — G894 Chronic pain syndrome: Secondary | ICD-10-CM

## 2021-10-19 DIAGNOSIS — I10 Essential (primary) hypertension: Secondary | ICD-10-CM

## 2021-10-19 DIAGNOSIS — K3184 Gastroparesis: Secondary | ICD-10-CM | POA: Diagnosis not present

## 2021-10-19 DIAGNOSIS — M797 Fibromyalgia: Secondary | ICD-10-CM | POA: Diagnosis not present

## 2021-10-19 DIAGNOSIS — Z23 Encounter for immunization: Secondary | ICD-10-CM

## 2021-10-19 LAB — COMPREHENSIVE METABOLIC PANEL
ALT: 43 U/L — ABNORMAL HIGH (ref 0–35)
AST: 21 U/L (ref 0–37)
Albumin: 4 g/dL (ref 3.5–5.2)
Alkaline Phosphatase: 86 U/L (ref 39–117)
BUN: 16 mg/dL (ref 6–23)
CO2: 23 mEq/L (ref 19–32)
Calcium: 9.1 mg/dL (ref 8.4–10.5)
Chloride: 105 mEq/L (ref 96–112)
Creatinine, Ser: 0.82 mg/dL (ref 0.40–1.20)
GFR: 81.96 mL/min (ref >60.00)
Glucose, Bld: 156 mg/dL — ABNORMAL HIGH (ref 70–99)
Potassium: 4.7 mEq/L (ref 3.5–5.1)
Sodium: 141 mEq/L (ref 135–145)
Total Bilirubin: 0.2 mg/dL (ref 0.2–1.2)
Total Protein: 6.3 g/dL (ref 6.0–8.3)

## 2021-10-19 MED ORDER — METOPROLOL SUCCINATE ER 100 MG PO TB24
ORAL_TABLET | ORAL | 1 refills | Status: DC
Start: 2021-10-19 — End: 2022-02-07

## 2021-10-19 MED ORDER — FUROSEMIDE 20 MG PO TABS
ORAL_TABLET | ORAL | 1 refills | Status: DC
Start: 2021-10-19 — End: 2022-02-07

## 2021-10-19 MED ORDER — ROSUVASTATIN CALCIUM 10 MG PO TABS
10.0000 mg | ORAL_TABLET | Freq: Every day | ORAL | 1 refills | Status: DC
Start: 2021-10-19 — End: 2022-02-07

## 2021-10-19 MED ORDER — HYDROCODONE-ACETAMINOPHEN 5-325 MG PO TABS: 1.0000 | ORAL_TABLET | Freq: Four times a day (QID) | ORAL | 0 refills | Status: DC | PRN

## 2021-10-19 MED ORDER — PANTOPRAZOLE SODIUM 40 MG PO TBEC: DELAYED_RELEASE_TABLET | ORAL | 1 refills | Status: DC

## 2021-10-19 MED ORDER — METOCLOPRAMIDE HCL 5 MG PO TABS: ORAL_TABLET | ORAL | 1 refills | Status: DC

## 2021-10-19 MED ORDER — AMLODIPINE BESYLATE 5 MG PO TABS: 5.0000 mg | ORAL_TABLET | Freq: Every day | ORAL | 1 refills | Status: DC

## 2021-10-19 NOTE — Progress Notes (Signed)
OFFICE VISIT  10/19/2021  CC:  Chief Complaint  Patient presents with   Hypertension   Hyperlipidemia   Patient is a 51 y.o. female who presents for 98-monthfollow-up hypertension, hyperlipidemia, and fibromyalgia with suspected superimposed polymyalgia rheumatica.  #1 fibromyalgia with suspected superimposed polymyalgia rheumatica. Severe diabetic retinopathy and relatively uncontrolled diabetes precludes use of prednisone chronic prednisone treatment for her PMR.  Rheumatology is following her, most recent visit was 05/10/21.  A steroid-sparing agent has not been an option for her due to elevated LFTs.  Most recently her ophthalmologist stated she should not take Plaquenil.  NSAIDs and methotrexate too much risk of worsening her NAFLD. Her current mainstay of pain treatment is gabapentin and as needed Vicodin, which she uses very little of. #40 Vicodin 5/325 prescribed today, 1-2 tabs every 6 as needed pain. She is unable to work due to her chronic pain. She gave me some disability paperwork to fill out we talked about this some today.   #2 hypertension, currently well controlled on amlodipine 5 mg a day, losartan 100 mg a day, Toprol-XL 200 mg a day.  Complete metabolic panel when she returns fasting.  3.  Hyperlipidemia: Tolerating 10 mg Crestor every other day. LDL goal is less than 70. Her LDL was 58 when last checked over a year ago. She will return for fasting lipids and hepatic panel.   #4 nonalcoholic fatty liver disease. Abdominal ultrasound 07/07/2020--steatosis. Following LFTs with upcoming fasting labs.  5.  Type 2 diabetes with multiple complications--- followed by endocrinologist. Updated med list: JVania Reawas recently switched to [employer] to insurance. Lantus was recently switched to [employer] to insurance. She does continue on Victoza and metformin. She is on losartan and verapamil for microalbuminuria."  INTERIM HX: KJoniseems to be doing better. Has  been taking gabapentin 300 3 times daily lately and finds this somewhat helpful to bring her pain to a manageable level.  She has even started attempting to go up stairs, which had previously been too hard for her to even start. She takes the hydrocodone as needed for flareups of severe pain which typically is about 1 to 2 days a week at the most.   Home blood pressure monitoring have been in the 120s for the most part, occasionally 1683Msystolic.  Diastolics 619Qto 822W  Last visit her metabolic panel was normal. Her lipid panel was normal except for triglycerides 266, similar to past measurements.  PMP AWARE reviewed today: most recent rx for vicodin was filled 07/15/21, # [phone] rx by me. No red flags.  Past Medical History:  Diagnosis Date   Abdominal bloating    likely from diab gastroparesis.  Dr. AHavery Morosstarted trial of reglan 03/2019.   [place]    Cystic lesion in cerebral aqueduct region with mild hydrocephalus-- stable MRI 02/2016.  Surveillance MRI 05/2017 --dilated cerebral aqueduct related to aqueductal stenosis and subsequent mild hydrocephalus (due to the 11 mm stable cystic lesion in cerebral aqueduct---?congenitial?.   Cataract    OU   Dysmenorrhea    vicodin occ during first 2 days of cycle.   Fibromyalgia    Gluten intolerance    pt reports she underwent full GI w/u to r/o celiac dz   Hepatic steatosis    ultrasound 08/2017. Hx of very mild elevation of ALT.  Stable on u/s 06/2020   History of adenomatous polyp of colon 04/08/2019   recall Feb 2024   Hyperlipidemia, mixed  Hypertension    +white coat component   Hypertensive retinopathy of both eyes    Insomnia    Iron deficiency 01/2019   Hb 11.3. Hemoccults neg x 3 03/19/19. EGD and colonoscopy 04/08/19 showed NO cause for IDA.  Pt does have menorrhagia, though, so she'll see her GYN.  started FeSO4 325 qd approx 04/14/19.   Menorrhagia    resulting in IDA 2021   PMR (polymyalgia rheumatica) ([place])  2022   question of; hx of elevated ESR-->better with prednisone but prednisone was contraindicated d/t eye issues/hyperglycemia-->rheum started following her 02/2021->plaquenil 04/2021   Proliferative diabetic retinopathy of both eyes ([place])    steroid injections 10/2017--improved   Sensorineural hearing loss of left ear    Sudden left hearing loss summer 2016--no improvement with steroids 01/2015 so brain MRI done by Dr. Redmond Baseman and it showed brain tumor that was determined to be benign.  Pt's hearing not bad enough for hearing aid as of 06/2016.   Type 2 diabetes with complication (HCC)    +microalbuminuria, diab retpthy, diabetic gastroparesis (gastric emptying study mildly abnl 03/2017).  Recommended lantus 08/2018 but pt declined. Mild microalbuminuria.   Uterine fibroid 2022   per pt report, pelvic u/s in GYN office    Past Surgical History:  Procedure Laterality Date   ANOSCOPY  05/12/2019   Procedure: normal exam, minimal hemorrhoid disease. Hyertrophied anal papila, benign appearing, posterior midline. Surgeon: Leighton Ruff MD   CHOLECYSTECTOMY  2000   COLONOSCOPY  04/08/2019   5 adenomas, recall 3 yrs; no cause for IDA found.  Hypertrophied anal papillae->bx showed low grade dysplasia; GI referred her to colorectal surgeon.   ESOPHAGOGASTRODUODENOSCOPY  04/08/2019   mild chronic reactive gastritis. H pylori NEG.  No cause for IDA found.   GAS INSERTION Right 10/31/2018   Procedure: Insertion Of C3F8 Gas;  Surgeon: Bernarda Caffey, MD;  Location: [facility];  Service: Ophthalmology;  Laterality: Right;   GASTRIC EMPTYING SCAN  04/20/2017   Mildly abnormal, particularly the 1st hour of emptying.   MEMBRANE PEEL Right 10/31/2018   Procedure: MEMBRANE PEEL;  Surgeon: Bernarda Caffey, MD;  Location: [facility];  Service: Ophthalmology;  Laterality: Right;   PARS PLANA VITRECTOMY Right 04/12/2018   Procedure: Right PARS PLANA VITRECTOMY WITH 25 GAUGE with intravitreal antibiotics;  Surgeon: Bernarda Caffey,  MD;  Location: [facility];  Service: Ophthalmology;  Laterality: Right;   PARS PLANA VITRECTOMY Right 10/31/2018   Procedure: PARS PLANA VITRECTOMY WITH 25 GAUGE;  Surgeon: Bernarda Caffey, MD;  Location: [facility];  Service: Ophthalmology;  Laterality: Right;   PHOTOCOAGULATION WITH LASER Right 10/31/2018   Procedure: Photocoagulation With Laser;  Surgeon: Bernarda Caffey, MD;  Location: [facility];  Service: Ophthalmology;  Laterality: Right;   TRANSTHORACIC ECHOCARDIOGRAM  08/23/2020   Grd I DD, o/w normal.    Outpatient Medications Prior to Visit  Medication Sig Dispense Refill   Continuous Blood Gluc Sensor (FREESTYLE LIBRE 2 SENSOR) MISC by Does not apply route.     FARXIGA 10 MG TABS tablet Take 10 mg by mouth daily.     gabapentin (NEURONTIN) 300 MG capsule TAKE 1 CAPSULE BY MOUTH THREE TIMES DAILY 90 capsule 5   insulin aspart (NOVOLOG) 100 UNIT/ML injection Inject 10 Units into the skin 2 (two) times daily with a meal. Per endocrinologist     Insulin Glargine (BASAGLAR KWIKPEN) 100 UNIT/ML 15-18 units     losartan (COZAAR) 100 MG tablet Take 1 tablet (100 mg total) by mouth daily. 90 tablet  3   meloxicam (MOBIC) 15 MG tablet 1 tablet as needed     metFORMIN (GLUCOPHAGE) 1000 MG tablet Take 1 tablet (1,000 mg total) by mouth 2 (two) times daily with a meal. 180 tablet 0   Multiple Vitamin (MULTIVITAMIN WITH MINERALS) TABS tablet Take 1 tablet by mouth daily.     promethazine (PHENERGAN) 12.5 MG tablet TAKE 1 TO 2 TABLETS BY MOUTH EVERY 6 HOURS AS NEEDED FOR NAUSEA 30 tablet 0   verapamil (CALAN-SR) 240 MG CR tablet Take 1 tablet (240 mg total) by mouth at bedtime. 90 tablet 3   VICTOZA 18 MG/3ML SOPN INJECT 1.2MG INTO THE SKIN DAILY (Patient taking differently: 1.8 mg. INJECT 1.2MG INTO THE SKIN DAILY) 18 mL 0   amLODipine (NORVASC) 5 MG tablet Take 1 tablet (5 mg total) by mouth daily. 90 tablet 3   Continuous Blood Gluc Sensor (FREESTYLE LIBRE 3 SENSOR) MISC USE ONE SENSOR EVERY 14 DAYS FOR 28 DAYS      furosemide (LASIX) 20 MG tablet TAKE 2 TABLETS BY MOUTH IN THE MORNING AND 1 IN THE EVENING 180 tablet 0   HYDROcodone-acetaminophen (NORCO/VICODIN) 5-325 MG tablet Take 1-2 tablets by mouth every 6 (six) hours as needed for moderate pain. 40 tablet 0   metoCLOPramide (REGLAN) 5 MG tablet TAKE 1 TABLET BY MOUTH 4 TIMES DAILY BEFORE MEAL(S) AND AT BEDTIME 360 tablet 3   metoprolol succinate (TOPROL-XL) 100 MG 24 hr tablet Take 2 tablets by mouth once daily 180 tablet 3   pantoprazole (PROTONIX) 40 MG tablet TAKE 1 TABLET BY MOUTH ONCE DAILY . 90 tablet 3   rosuvastatin (CRESTOR) 10 MG tablet Take 1 tablet (10 mg total) by mouth daily. 90 tablet 3   No facility-administered medications prior to visit.    Allergies  Allergen Reactions   Gluten Meal Swelling   Lisinopril Cough   Pioglitazone Other (See Comments)    ELEVATED glucoses + worse chronic nausea    ROS As per HPI  PE:    10/19/2021    1:04 PM 07/15/2021    2:27 PM 03/11/2021    1:01 PM  Vitals with BMI  Height 5' 3"  5' 3"  5' 3"   Weight 183 lbs 6 oz 192 lbs 6 oz 194 lbs 6 oz  BMI 32.5 41.28 78.67  Systolic 672 094 709  Diastolic 76 66 72  Pulse 82 87 92     Physical Exam  Gen: Alert, well appearing.  Patient is oriented to person, place, time, and situation.. AFFECT: pleasant, lucid thought and speech. No further exam today.  LABS:  Last CBC Lab Results  Component Value Date   WBC 8.8 03/17/2021   HGB 11.5 (A) 03/17/2021   HCT 35 (A) 03/17/2021   MCV 83.8 07/12/2020   MCH 27.5 07/12/2020   RDW 14.1 07/12/2020   PLT 506 (A) 03/17/2021   Lab Results  Component Value Date   IRON 44 (L) 07/12/2020   TIBC 447 07/12/2020   FERRITIN 26 62/83/6629   Last metabolic panel Lab Results  Component Value Date   GLUCOSE 163 (H) 07/29/2021   NA 141 07/29/2021   K 4.9 07/29/2021   CL 107 07/29/2021   CO2 22 07/29/2021   BUN 18 07/29/2021   CREATININE 0.76 07/29/2021   GFRNONAA >60 10/31/2018   CALCIUM 9.2  07/29/2021   PROT 6.2 07/29/2021   ALBUMIN 3.9 11/10/2020   BILITOT 0.2 07/29/2021   ALKPHOS 83 11/10/2020   AST 33 07/29/2021  ALT 39 (H) 07/29/2021   ANIONGAP 12 10/31/2018   Last lipids Lab Results  Component Value Date   CHOL 138 07/29/2021   HDL 37 (L) 07/29/2021   LDLCALC 66 07/29/2021   LDLDIRECT 77.0 07/19/2016   TRIG 266 (H) 07/29/2021   CHOLHDL 3.7 07/29/2021   Last hemoglobin A1c Lab Results  Component Value Date   HGBA1C 8.6 (H) 11/10/2020   Last thyroid functions Lab Results  Component Value Date   TSH 4.24 11/10/2020   T3TOTAL 129 11/10/2020   Lab Results  Component Value Date   VZCHYIFO27 741 01/29/2019   IMPRESSION AND PLAN:  #1 hypertension, well controlled on amlodipine 5 mg a day, losartan 100 mg a day, Toprol-XL 200 mg a day. Electrolytes and creatinine today.  2.  Mixed hyperlipidemia. Continue to work on low-fat low-carb intake. Last LDL was 66 about 3 months ago. Continue rosuvastatin 10 mg a day.  #3 fibromyalgia, chronic pain. She has superimposed polymyalgia rheumatica. Her medical comorbidities have precluded use of prednisone and other rheumatic agents. Continue gabapentin 300 mg 3 times daily, meloxicam 15 mg daily as needed, and Vicodin 5-325, 1 every 6 as needed.  #40 prescribed today.  #4 type 2 diabetes, multiple complications. She was out of insulin for a while due to being in the donut hole.  She has gotten back on this now and says her glucoses have been well controlled. She is following up with Dr. Cruzita Lederer and endocrinology.  An After Visit Summary was printed and given to the patient.  FOLLOW UP: Return in about 3 months (around 01/19/2022) for annual CPE (fasting).  Signed:  Crissie Sickles, MD           10/19/2021

## 2021-10-27 NOTE — Progress Notes (Signed)
[facility] Note  11/08/2021     CHIEF COMPLAINT Patient presents for Retina Follow Up  HISTORY OF PRESENT ILLNESS: Gloria Gomez is a 51 y.o. female who presents to the clinic today for:  HPI     Retina Follow Up   Patient presents with  Diabetic Retinopathy.  In both eyes.  Severity is moderate.  Duration of 4 weeks.  Since onset it is stable.  I, the attending physician,  performed the HPI with the patient and updated documentation appropriately.        Comments   Pt here for 4 wk ret f/u PDR OU. Pt states VA is the same, no changes.       Last edited by Bernarda Caffey, MD on 11/08/2021  3:37 PM.     Pt states vision is stable, her blood sugars are running between 140-144  Referring physician:   HISTORICAL INFORMATION:   Selected notes from the [place] Referred for DM exam   CURRENT MEDICATIONS: No current outpatient medications on file. (Ophthalmic Drugs)   No current facility-administered medications for this visit. (Ophthalmic Drugs)   Current Outpatient Medications (Other)  Medication Sig   amLODipine (NORVASC) 5 MG tablet Take 1 tablet (5 mg total) by mouth daily.   Continuous Blood Gluc Sensor (FREESTYLE LIBRE 2 SENSOR) MISC by Does not apply route.   FARXIGA 10 MG TABS tablet Take 10 mg by mouth daily.   furosemide (LASIX) 20 MG tablet TAKE 2 TABLETS BY MOUTH IN THE MORNING AND 1 IN THE EVENING   gabapentin (NEURONTIN) 300 MG capsule TAKE 1 CAPSULE BY MOUTH THREE TIMES DAILY   HYDROcodone-acetaminophen (NORCO/VICODIN) 5-325 MG tablet Take 1-2 tablets by mouth every 6 (six) hours as needed for moderate pain.   insulin aspart (NOVOLOG) 100 UNIT/ML injection Inject 10 Units into the skin 2 (two) times daily with a meal. Per endocrinologist   Insulin Glargine (BASAGLAR KWIKPEN) 100 UNIT/ML 15-18 units   losartan (COZAAR) 100 MG tablet Take 1 tablet (100 mg total) by mouth daily.   meloxicam (MOBIC) 15 MG tablet 1  tablet as needed   metFORMIN (GLUCOPHAGE) 1000 MG tablet Take 1 tablet (1,000 mg total) by mouth 2 (two) times daily with a meal.   metoCLOPramide (REGLAN) 5 MG tablet TAKE 1 TABLET BY MOUTH 4 TIMES DAILY BEFORE MEAL(S) AND AT BEDTIME   metoprolol succinate (TOPROL-XL) 100 MG 24 hr tablet Take 2 tablets by mouth once daily   Multiple Vitamin (MULTIVITAMIN WITH MINERALS) TABS tablet Take 1 tablet by mouth daily.   pantoprazole (PROTONIX) 40 MG tablet TAKE 1 TABLET BY MOUTH ONCE DAILY .   promethazine (PHENERGAN) 12.5 MG tablet TAKE 1 TO 2 TABLETS BY MOUTH EVERY 6 HOURS AS NEEDED FOR NAUSEA   rosuvastatin (CRESTOR) 10 MG tablet Take 1 tablet (10 mg total) by mouth daily.   verapamil (CALAN-SR) 240 MG CR tablet Take 1 tablet (240 mg total) by mouth at bedtime.   VICTOZA 18 MG/3ML SOPN INJECT 1.2MG INTO THE SKIN DAILY (Patient taking differently: 1.8 mg. INJECT 1.2MG INTO THE SKIN DAILY)   No current facility-administered medications for this visit. (Other)   REVIEW OF SYSTEMS: ROS   Positive for: Endocrine, Eyes Negative for: Constitutional, Gastrointestinal, Neurological, Skin, Genitourinary, Musculoskeletal, HENT, Cardiovascular, Respiratory, Psychiatric, Allergic/Imm, Heme/Lymph Last edited by Kingsley Spittle, COT on 11/08/2021  2:13 PM.      ALLERGIES Allergies  Allergen Reactions   Gluten Meal Swelling  Lisinopril Cough   Pioglitazone Other (See Comments)    ELEVATED glucoses + worse chronic nausea   PAST MEDICAL HISTORY Past Medical History:  Diagnosis Date   Abdominal bloating    likely from diab gastroparesis.  Dr. Havery Moros started trial of reglan 03/2019.   Benign brain tumor ([place])    Cystic lesion in cerebral aqueduct region with mild hydrocephalus-- stable MRI 02/2016.  Surveillance MRI 05/2017 --dilated cerebral aqueduct related to aqueductal stenosis and subsequent mild hydrocephalus (due to the 11 mm stable cystic lesion in cerebral aqueduct---?congenitial?.    Cataract    OU   Dysmenorrhea    vicodin occ during first 2 days of cycle.   Fibromyalgia    Gluten intolerance    pt reports she underwent full GI w/u to r/o celiac dz   Hepatic steatosis    ultrasound 08/2017. Hx of very mild elevation of ALT.  Stable on u/s 06/2020   History of adenomatous polyp of colon 04/08/2019   recall Feb 2024   Hyperlipidemia, mixed    Hypertension    +white coat component   Hypertensive retinopathy of both eyes    Insomnia    Iron deficiency 01/2019   Hb 11.3. Hemoccults neg x 3 03/19/19. EGD and colonoscopy 04/08/19 showed NO cause for IDA.  Pt does have menorrhagia, though, so she'll see her GYN.  started FeSO4 325 qd approx 04/14/19.   Menorrhagia    resulting in IDA 2021   PMR (polymyalgia rheumatica) ([place]) 2022   question of; hx of elevated ESR-->better with prednisone but prednisone was contraindicated d/t eye issues/hyperglycemia-->rheum started following her 02/2021->plaquenil 04/2021   Proliferative diabetic retinopathy of both eyes ([place])    steroid injections 10/2017--improved   Sensorineural hearing loss of left ear    Sudden left hearing loss summer 2016--no improvement with steroids 01/2015 so brain MRI done by Dr. Redmond Baseman and it showed brain tumor that was determined to be benign.  Pt's hearing not bad enough for hearing aid as of 06/2016.   Type 2 diabetes with complication (HCC)    +microalbuminuria, diab retpthy, diabetic gastroparesis (gastric emptying study mildly abnl 03/2017).  Recommended lantus 08/2018 but pt declined. Mild microalbuminuria.   Uterine fibroid 2022   per pt report, pelvic u/s in GYN office   Past Surgical History:  Procedure Laterality Date   ANOSCOPY  05/12/2019   Procedure: normal exam, minimal hemorrhoid disease. Hyertrophied anal papila, benign appearing, posterior midline. Surgeon: Leighton Ruff MD   CHOLECYSTECTOMY  2000   COLONOSCOPY  04/08/2019   5 adenomas, recall 3 yrs; no cause for IDA found.  Hypertrophied anal  papillae->bx showed low grade dysplasia; GI referred her to colorectal surgeon.   ESOPHAGOGASTRODUODENOSCOPY  04/08/2019   mild chronic reactive gastritis. H pylori NEG.  No cause for IDA found.   GAS INSERTION Right 10/31/2018   Procedure: Insertion Of C3F8 Gas;  Surgeon: Bernarda Caffey, MD;  Location: [place];  Service: Ophthalmology;  Laterality: Right;   GASTRIC EMPTYING SCAN  04/20/2017   Mildly abnormal, particularly the 1st hour of emptying.   MEMBRANE PEEL Right 10/31/2018   Procedure: MEMBRANE PEEL;  Surgeon: Bernarda Caffey, MD;  Location: [place];  Service: Ophthalmology;  Laterality: Right;   PARS PLANA VITRECTOMY Right 04/12/2018   Procedure: Right PARS PLANA VITRECTOMY WITH 25 GAUGE with intravitreal antibiotics;  Surgeon: Bernarda Caffey, MD;  Location: [place];  Service: Ophthalmology;  Laterality: Right;   PARS PLANA VITRECTOMY Right 10/31/2018   Procedure: PARS PLANA VITRECTOMY  WITH 25 GAUGE;  Surgeon: Bernarda Caffey, MD;  Location: [place];  Service: Ophthalmology;  Laterality: Right;   PHOTOCOAGULATION WITH LASER Right 10/31/2018   Procedure: Photocoagulation With Laser;  Surgeon: Bernarda Caffey, MD;  Location: [facility];  Service: Ophthalmology;  Laterality: Right;   TRANSTHORACIC ECHOCARDIOGRAM  08/23/2020   Grd I DD, o/w normal.   FAMILY HISTORY Family History  Problem Relation Age of Onset   Brain cancer Mother    Diabetes Father    Diabetes Maternal Grandmother    Cataracts Maternal Grandmother    Cervical cancer Paternal Grandmother    Colon cancer Maternal Grandfather 80   Amblyopia Neg Hx    Blindness Neg Hx    Glaucoma Neg Hx    Macular degeneration Neg Hx    Retinal detachment Neg Hx    Strabismus Neg Hx    Retinitis pigmentosa Neg Hx    Esophageal cancer Neg Hx    Stomach cancer Neg Hx    Rectal cancer Neg Hx    SOCIAL HISTORY Social History   Tobacco Use   Smoking status: Never   Smokeless tobacco: Never  Vaping Use   Vaping Use: Never used  Substance Use  Topics   Alcohol use: No   Drug use: No       OPHTHALMIC EXAM:  Base Eye Exam     Visual Acuity (Snellen - Linear)       Right Left   Dist [place] 20/25 -2    Dist cc  20/30 -2   Dist ph [place] NI    Dist ph cc  20/25 -2    Correction: Glasses         Tonometry (Tonopen, 2:19 PM)       Right Left   Pressure 10 11         Pupils       Dark Light Shape React APD   Right 3 2 Round Brisk None   Left 3 2 Round Brisk None         Visual Fields (Counting fingers)       Left Right    Full Full         Extraocular Movement       Right Left    Full, Ortho Full, Ortho         Neuro/Psych     Oriented x3: Yes   Mood/Affect: Normal         Dilation     Both eyes: 1.0% Mydriacyl, 2.5% Phenylephrine @ 2:19 PM           Slit Lamp and Fundus Exam     Slit Lamp Exam       Right Left   Lids/Lashes Dermatochalasis - upper lid, mild Meibomian gland dysfunction, Telangiectasia Dermatochalasis - upper lid, Meibomian gland dysfunction, Telangiectasia   Conjunctiva/Sclera White and quiet White and quiet   Cornea Clear, well healed temporal cataract wounds Trace Punctate epithelial erosions, trace EBMD   Anterior Chamber Deep and quiet Deep and quiet   Iris Round and dilated, No NVI Round and dilated, No NVI   Lens PC IOL in good position, 1-2+ Posterior capsular opacification, PC folds 2-3+ Nuclear sclerosis with mild brunescence, 2-3+ Cortical cataract, 1+ Posterior subcapsular cataract   Anterior Vitreous post vitrectomy, trace pigment Vitreous syneresis, mild vitreous condensations clearing centrally and settling inferiorly -- essentially resolved         Fundus Exam       Right Left   Disc  mild Pallor, Sharp rim, temporal PPA Pink and sharp, Compact, PPA   C/D Ratio 0.2 0.0   Macula Flat, blunted foveal reflex, mild cystic changes -- improved, punctate exudate - improved, scattered MA ST mac -- improved, +focal laser changes Flat, good foveal reflex,  trace cystic changes, scattered MA; trace ERM, light focal laser changes, no edema   Vessels attenuated, mild tortuosity attenuated, mild Tortuousity   Periphery Attached, rare MA, scattered DBH greatest posteriorly, 360 PRP, good laser fill in 360 attached, scattered IRH; 360 PRP scars -- with good fill in changes           Refraction     Wearing Rx       Sphere Cylinder   Right -3.75 Sphere   Left -3.75 Sphere           IMAGING AND PROCEDURES  Imaging and Procedures for   OCT, Retina - OU - Both Eyes       Right Eye Quality was good. Central Foveal Thickness: 336. Progression has improved. Findings include no SRF, abnormal foveal contour, intraretinal hyper-reflective material, epiretinal membrane, intraretinal fluid (Persistent IRF temporal macula and fovea---Slightly improved).   Left Eye Quality was good. Central Foveal Thickness: 277. Progression has been stable. Findings include normal foveal contour, no IRF, no SRF, intraretinal hyper-reflective material (Stable resolution of central IRF/cystic changes, stable improvement in vitreous opacities ).   Notes *Images captured and stored on drive  Diagnosis / Impression:  DME OU OD: Persistent IRF temporal macula and fovea---Slightly improved OS: Stable resolution of central IRF/cystic changes, stable improvement in vitreous opacities   Clinical management:  See below  Abbreviations: NFP - Normal foveal profile. CME - cystoid macular edema. PED - pigment epithelial detachment. IRF - intraretinal fluid. SRF - subretinal fluid. EZ - ellipsoid zone. ERM - epiretinal membrane. ORA - outer retinal atrophy. ORT - outer retinal tubulation. SRHM - subretinal hyper-reflective material       Intravitreal Injection, Pharmacologic Agent - OD - Right Eye       Time Out 11/08/2021. 2:44 PM. Confirmed correct patient, procedure, site, and patient consented.   Anesthesia Topical anesthesia was used. Anesthetic medications  included Lidocaine 2%, Proparacaine 0.5%.   Procedure Preparation included 5% betadine to ocular surface, eyelid speculum. A (32g) needle was used.   Injection: 6 mg faricimab-svoa 6 MG/0.05ML   Route: Intravitreal, Site: Right Eye   NDC: [phone], Lot: [id number], Expiration date: 08/27/2023, Waste: 0 mL   Post-op Post injection exam found visual acuity of at least counting fingers. The patient tolerated the procedure well. There were no complications. The patient received written and verbal post procedure care education. Post injection medications were not given.            ASSESSMENT/PLAN:   ICD-10-CM   1. Proliferative diabetic retinopathy of both eyes with macular edema associated with type 2 diabetes mellitus (HCC)  E11.3513 OCT, Retina - OU - Both Eyes    Intravitreal Injection, Pharmacologic Agent - OD - Right Eye    faricimab-svoa (VABYSMO) 39m/0.05mL intravitreal injection    2. Vitreous hemorrhage of left eye (HCC)  H43.12     3. Right endophthalmia  H44.001     4. Vitreous hemorrhage, right eye ([place]  H43.11     5. Essential hypertension  I10     6. Hypertensive retinopathy of both eyes  H35.033     7. Combined forms of age-related cataract of left eye  H25.812     8. Pseudophakia  of right eye  Z96.1      1.  Proliferative diabetic retinopathy w/ DME, OU  - HbA1c 7.1% (3.23.23), 8.6% (9.14.22), 7.8% (06.13.22), 9.1% (02.24.22) 11.0% (11.22.21) - s/p IVA OD #1 9.20.19, #2 (10.25.19), #3 (11.15.19), #4 (12.17.19), #5 (01.14.20), #6 (2.11.20), #7 (05.29.20), #8 (08.19.20), #9 (10.30.20), #10 (12.09.20), #11 (01.11.21), #12 (02.15.21), #13 (03.23.21), #14 (04.20.21), #15 (06.08.21), #16 (07.06.21) -- IVA resistance - s/p IVA OS #1 9.27.19, #2 (10.25.19), #3 (11.15.19), #4 (12.17.19), #5 (01.14.20), #6 (2.11.20), #7 (04.26.20), #8 (05.29.20), #9 (06.26.20), #10 (08.05.20), #11 (11.11.20), #12 (12.09.20), #13 (01.11.21), #14 (02.15.21), #15 (03.23.21), #16 (04.20.21),  #17 (06.08.21) -- IVA resistance, #18 (09.27.21) - s/p IVE OD #1 (08.03.21) -- sample, #2 (09.10.21), #3 (10.08.21), #4 (11.23.21), #5 (12.21.21), #6 (01.19.22) sample, #7 (2.16.22), #8 (3.22.22), #9 (04.19.22), #10 (05.27.22), #11 (06.29.22), #12 (08.03.22), #13 (09.07.22), #14 (10.10.22), #15 (11.14.22), #16 (12.12.22), #17 (01.10.23) - sample, #18 (02.07.23), #19 (03.21.23), #20 (04.18.23), #21 (05.16.23) -- IVE resistance  - s/p IVE OS #1 (10.25.21), #2 (11.23.21), #3 (12.21.21), #4 (3.22.22), #5 (06.29.22), #6 (08.03.22), #7 (09.07.22), #8 (10.10.22), #9 (11.14.22), #10 (02.07.23), #11 (05.16.23), #12 (08.15.23)  - IVV OD #1 (06.16.23 -- sample), #2 (07.17.23), #3 (08.15.23)  - S/P PRP OS (09.20.19), (5.19.20), (08.19.20), (04.28.21), (02.02.22)  - S/P PRP OD (9.27.19 and 11.21.19), fill-in (04.14.20) (09.03.20, surgery)  - S/P focal laser OS (07.06.21), OD (09.20.22)  - FA (9.20.19) shows +NVE OU and leaking MA and capillary nonperfusion  - repeat FA 11.15.19 shows NV regressing OU  - pre-op: OD w/ VA stable at 20/25, but there is some preretinal fibrosis / tractional membranes just superior to disc and mild central DME  - s/p 25g PPV+MP+10% C3F8 gas OD (09.03.20) -- ERM/PRF removal OD  - BCVA 20/25 OD - stable; OS stable at 20/25             - fibrosis/ERM stably improved; retina attached  - OCT shows OD: peristent IRF temporal macula and fovea; OS: Stable resolution of central IRF/cystic changes, stable improvement in vitreous opacities  - recommend IVV OD #4 today 09.12.23  - **OS on ~q61mmaintenance treatment schedule**  - pt in agreement  - RBA of procedure discussed, questions answered - see procedure note  - [place] informed consent form re-signed and scanned on 05.16.2023  - Vabysmo informed consent form signed on 06.16.23  - f/u 4 weeks, DFE, OCT, possible injection  2. Vitreous hemorrhage OS -- stably improved  - recurrent VH, onset 09.22.21  - etiology: secondary to PDR as  described above (no RT/RD on exam)  - s/p PRP OS (9.20.19), (05.19.20), (08.19.20), (04.28.21), (02.02.22)  - s/p IVA OS on 4.26.20, 5.29.20, 6.26.20, 8.5.20,11.11.20, 12.06.20 and so on as above   - VH clear centrally and settled inferiorly -- now white  - BCVA stable at 20/25  - f/u 4 weeks DFE, OCT  3. History of Endophthalmitis OD  - s/p IVA OU 04/09/2018  - s/p 25g PPV w/ intravitreal vanc, ceftaz and cefepime OD, 2.14.2020  - s/p intravitreal tap / vanc and ceflaz injections (02.16.20)             - gram stain (2.14.20) shows G+ cocci, WBCs mostly PMNs;   - repeat gram stain from t/i (2.16.20) -- no organisms, just WBCs             - cultures from vitreous grew rare Staph warneri; cultures from t/i -- no growth             -  doing well, BCVA 20/25             - inflammation/posterior debris resolved             - IOP 14 off Brimonidine  - completed po pred taper -- caused significant elevations in BG  - monitor  4. History of Vitreous Hemorrhage OD -- cleared from PPV x2 for endophthalmitis and ERM/preretinal fibrosis   - secondary to PDR  5,6. Hypertensive retinopathy OU  - discussed importance of tight BP control.  - monitor  7. Combined form age related cataract OS-   - The symptoms of cataract, surgical options, and treatments and risks were discussed with patient.   - discussed diagnosis and progression  8. Pseudophakia OD  - s/p CE/IOL OD (Dr. Zenia Resides, 12.11.20)  - beautiful surgeries, doing well  Ophthalmic Meds Ordered this visit:  Meds ordered this encounter  Medications   faricimab-svoa (VABYSMO) 51m/0.05mL intravitreal injection     Return in about 4 weeks (around 12/06/2021) for f/u PDR OU, DFE, OCT.  There are no Patient Instructions on file for this visit.  This document serves as a record of services personally performed by BGardiner Sleeper MD, PhD. It was created on their behalf by CRenaldo Reel CTat [place] ophthalmic technician. The creation of  this record is the provider's dictation and/or activities during the visit.    Electronically signed by:  CRenaldo Reel COT  10/27/21 3:39 PM  This document serves as a record of services personally performed by BGardiner Sleeper MD, PhD. It was created on their behalf by ASan Jetty BOwens Shark OA an ophthalmic technician. The creation of this record is the provider's dictation and/or activities during the visit.    Electronically signed by: ASan Jetty BMarguerita Merles09.12.2023 3:39 PM  BGardiner Sleeper M.D., Ph.D. Diseases & Surgery of the Retina and Vitreous [facility] I have reviewed the above documentation for accuracy and completeness, and I agree with the above. BGardiner Sleeper M.D., Ph.D. 11/08/21 3:40 PM   Abbreviations: M myopia (nearsighted); A astigmatism; H hyperopia (farsighted); P presbyopia; Mrx spectacle prescription;  CTL contact lenses; OD right eye; OS left eye; OU both eyes  XT exotropia; ET esotropia; PEK punctate epithelial keratitis; PEE punctate epithelial erosions; DES dry eye syndrome; MGD meibomian gland dysfunction; ATs artificial tears; PFAT's preservative free artificial tears; NMalintanuclear sclerotic cataract; PSC posterior subcapsular cataract; ERM epi-retinal membrane; PVD posterior vitreous detachment; RD retinal detachment; DM diabetes mellitus; DR diabetic retinopathy; NPDR non-proliferative diabetic retinopathy; PDR proliferative diabetic retinopathy; CSME clinically significant macular edema; DME diabetic macular edema; dbh dot blot hemorrhages; CWS cotton wool spot; POAG primary open angle glaucoma; C/D cup-to-disc ratio; HVF humphrey visual field; GVF goldmann visual field; OCT optical coherence tomography; IOP intraocular pressure; BRVO Branch retinal vein occlusion; CRVO central retinal vein occlusion; CRAO central retinal artery occlusion; BRAO branch retinal artery occlusion; RT retinal tear; SB scleral buckle; PPV pars plana vitrectomy; VH  Vitreous hemorrhage; PRP panretinal laser photocoagulation; IVK intravitreal kenalog; VMT vitreomacular traction; MH Macular hole;  NVD neovascularization of the disc; NVE neovascularization elsewhere; AREDS age related eye disease study; ARMD age related macular degeneration; POAG primary open angle glaucoma; EBMD epithelial/anterior basement membrane dystrophy; ACIOL anterior chamber intraocular lens; IOL intraocular lens; PCIOL posterior chamber intraocular lens; Phaco/IOL phacoemulsification with intraocular lens placement; PRocky Boy Westphotorefractive keratectomy; LASIK laser assisted in situ keratomileusis; HTN hypertension; DM diabetes mellitus; COPD chronic obstructive pulmonary disease

## 2021-11-01 DIAGNOSIS — E1165 Type 2 diabetes mellitus with hyperglycemia: Secondary | ICD-10-CM | POA: Diagnosis not present

## 2021-11-01 DIAGNOSIS — E785 Hyperlipidemia, unspecified: Secondary | ICD-10-CM | POA: Diagnosis not present

## 2021-11-01 DIAGNOSIS — I10 Essential (primary) hypertension: Secondary | ICD-10-CM | POA: Diagnosis not present

## 2021-11-01 DIAGNOSIS — E1169 Type 2 diabetes mellitus with other specified complication: Secondary | ICD-10-CM | POA: Diagnosis not present

## 2021-11-08 ENCOUNTER — Ambulatory Visit (INDEPENDENT_AMBULATORY_CARE_PROVIDER_SITE_OTHER): Payer: 59 | Admitting: Ophthalmology

## 2021-11-08 ENCOUNTER — Encounter (INDEPENDENT_AMBULATORY_CARE_PROVIDER_SITE_OTHER): Payer: Self-pay | Admitting: Ophthalmology

## 2021-11-08 DIAGNOSIS — H4312 Vitreous hemorrhage, left eye: Secondary | ICD-10-CM

## 2021-11-08 DIAGNOSIS — H35033 Hypertensive retinopathy, bilateral: Secondary | ICD-10-CM

## 2021-11-08 DIAGNOSIS — H4311 Vitreous hemorrhage, right eye: Secondary | ICD-10-CM

## 2021-11-08 DIAGNOSIS — H25812 Combined forms of age-related cataract, left eye: Secondary | ICD-10-CM

## 2021-11-08 DIAGNOSIS — Z961 Presence of intraocular lens: Secondary | ICD-10-CM | POA: Diagnosis not present

## 2021-11-08 DIAGNOSIS — H4313 Vitreous hemorrhage, bilateral: Secondary | ICD-10-CM | POA: Diagnosis not present

## 2021-11-08 DIAGNOSIS — H44001 Unspecified purulent endophthalmitis, right eye: Secondary | ICD-10-CM

## 2021-11-08 DIAGNOSIS — I10 Essential (primary) hypertension: Secondary | ICD-10-CM

## 2021-11-08 DIAGNOSIS — E113513 Type 2 diabetes mellitus with proliferative diabetic retinopathy with macular edema, bilateral: Secondary | ICD-10-CM | POA: Diagnosis not present

## 2021-11-08 MED ORDER — FARICIMAB-SVOA 6 MG/0.05ML IZ SOLN
6.0000 mg | INTRAVITREAL | Status: AC | PRN
  Administered 2021-11-08: 6 mg via INTRAVITREAL

## 2021-11-10 ENCOUNTER — Other Ambulatory Visit: Payer: Self-pay | Admitting: Family Medicine

## 2021-11-22 NOTE — Progress Notes (Signed)
[facility] Note  12/06/2021     CHIEF COMPLAINT Patient presents for Retina Follow Up  HISTORY OF PRESENT ILLNESS: Gloria Gomez is a 51 y.o. female who presents to the clinic today for:  HPI     Retina Follow Up   Patient presents with  Diabetic Retinopathy.  In both eyes.  Severity is moderate.  Duration of 4 weeks.  Since onset it is stable.  I, the attending physician,  performed the HPI with the patient and updated documentation appropriately.        Comments   Patient feels that the vision is the same since her last visit 4 weeks ago. Her blood sugar is 119 and her A1C is 7.9.      Last edited by Bernarda Caffey, MD on 12/07/2021  1:42 PM.    Pt states her vision is the same, her blood sugar has been good, her most recent A1c was 7.9, she goes back to see her endocrinologist in December  Referring physician:   HISTORICAL INFORMATION:   Selected notes from the [facility] Referred for DM exam   CURRENT MEDICATIONS: No current outpatient medications on file. (Ophthalmic Drugs)   No current facility-administered medications for this visit. (Ophthalmic Drugs)   Current Outpatient Medications (Other)  Medication Sig   amLODipine (NORVASC) 5 MG tablet Take 1 tablet (5 mg total) by mouth daily.   Continuous Blood Gluc Sensor (FREESTYLE LIBRE 2 SENSOR) MISC by Does not apply route.   FARXIGA 10 MG TABS tablet Take 10 mg by mouth daily.   furosemide (LASIX) 20 MG tablet TAKE 2 TABLETS BY MOUTH IN THE MORNING AND 1 IN THE EVENING   gabapentin (NEURONTIN) 300 MG capsule TAKE 1 CAPSULE BY MOUTH THREE TIMES DAILY   HYDROcodone-acetaminophen (NORCO/VICODIN) 5-325 MG tablet Take 1-2 tablets by mouth every 6 (six) hours as needed for moderate pain.   insulin aspart (NOVOLOG) 100 UNIT/ML injection Inject 10 Units into the skin 2 (two) times daily with a meal. Per endocrinologist   Insulin Glargine (BASAGLAR KWIKPEN) 100 UNIT/ML 15-18  units   losartan (COZAAR) 100 MG tablet Take 1 tablet (100 mg total) by mouth daily.   meloxicam (MOBIC) 15 MG tablet 1 tablet as needed   metFORMIN (GLUCOPHAGE) 1000 MG tablet TAKE 1 TABLET BY MOUTH TWICE DAILY WITH MEALS   metoCLOPramide (REGLAN) 5 MG tablet TAKE 1 TABLET BY MOUTH 4 TIMES DAILY BEFORE MEAL(S) AND AT BEDTIME   metoprolol succinate (TOPROL-XL) 100 MG 24 hr tablet Take 2 tablets by mouth once daily   Multiple Vitamin (MULTIVITAMIN WITH MINERALS) TABS tablet Take 1 tablet by mouth daily.   pantoprazole (PROTONIX) 40 MG tablet TAKE 1 TABLET BY MOUTH ONCE DAILY .   promethazine (PHENERGAN) 12.5 MG tablet TAKE 1 TO 2 TABLETS BY MOUTH EVERY 6 HOURS AS NEEDED FOR NAUSEA   rosuvastatin (CRESTOR) 10 MG tablet Take 1 tablet (10 mg total) by mouth daily.   verapamil (CALAN-SR) 240 MG CR tablet Take 1 tablet (240 mg total) by mouth at bedtime.   VICTOZA 18 MG/3ML SOPN INJECT 1.2MG INTO THE SKIN DAILY (Patient taking differently: 1.8 mg. INJECT 1.2MG INTO THE SKIN DAILY)   No current facility-administered medications for this visit. (Other)   REVIEW OF SYSTEMS: ROS   Positive for: Endocrine, Eyes Negative for: Constitutional, Gastrointestinal, Neurological, Skin, Genitourinary, Musculoskeletal, HENT, Cardiovascular, Respiratory, Psychiatric, Allergic/Imm, Heme/Lymph Last edited by Annie Paras, COT on 12/06/2021  1:57 PM.  ALLERGIES Allergies  Allergen Reactions   Gluten Meal Swelling   Lisinopril Cough   Pioglitazone Other (See Comments)    ELEVATED glucoses + worse chronic nausea   PAST MEDICAL HISTORY Past Medical History:  Diagnosis Date   Abdominal bloating    likely from diab gastroparesis.  Dr. Havery Moros started trial of reglan 03/2019.   Benign brain tumor ([place])    Cystic lesion in cerebral aqueduct region with mild hydrocephalus-- stable MRI 02/2016.  Surveillance MRI 05/2017 --dilated cerebral aqueduct related to aqueductal stenosis and subsequent mild  hydrocephalus (due to the 11 mm stable cystic lesion in cerebral aqueduct---?congenitial?.   Cataract    OU   Dysmenorrhea    vicodin occ during first 2 days of cycle.   Fibromyalgia    Gluten intolerance    pt reports she underwent full GI w/u to r/o celiac dz   Hepatic steatosis    ultrasound 08/2017. Hx of very mild elevation of ALT.  Stable on u/s 06/2020   History of adenomatous polyp of colon 04/08/2019   recall Feb 2024   Hyperlipidemia, mixed    Hypertension    +white coat component   Hypertensive retinopathy of both eyes    Insomnia    Iron deficiency 01/2019   Hb 11.3. Hemoccults neg x 3 03/19/19. EGD and colonoscopy 04/08/19 showed NO cause for IDA.  Pt does have menorrhagia, though, so she'll see her GYN.  started FeSO4 325 qd approx 04/14/19.   Menorrhagia    resulting in IDA 2021   PMR (polymyalgia rheumatica) ([place]) 2022   question of; hx of elevated ESR-->better with prednisone but prednisone was contraindicated d/t eye issues/hyperglycemia-->rheum started following her 02/2021->plaquenil 04/2021   Proliferative diabetic retinopathy of both eyes ([place])    steroid injections 10/2017--improved   Sensorineural hearing loss of left ear    Sudden left hearing loss summer 2016--no improvement with steroids 01/2015 so brain MRI done by Dr. Redmond Baseman and it showed brain tumor that was determined to be benign.  Pt's hearing not bad enough for hearing aid as of 06/2016.   Type 2 diabetes with complication (HCC)    +microalbuminuria, diab retpthy, diabetic gastroparesis (gastric emptying study mildly abnl 03/2017).  Recommended lantus 08/2018 but pt declined. Mild microalbuminuria.   Uterine fibroid 2022   per pt report, pelvic u/s in GYN office   Past Surgical History:  Procedure Laterality Date   ANOSCOPY  05/12/2019   Procedure: normal exam, minimal hemorrhoid disease. Hyertrophied anal papila, benign appearing, posterior midline. Surgeon: Leighton Ruff MD   CHOLECYSTECTOMY  2000    COLONOSCOPY  04/08/2019   5 adenomas, recall 3 yrs; no cause for IDA found.  Hypertrophied anal papillae->bx showed low grade dysplasia; GI referred her to colorectal surgeon.   ESOPHAGOGASTRODUODENOSCOPY  04/08/2019   mild chronic reactive gastritis. H pylori NEG.  No cause for IDA found.   GAS INSERTION Right 10/31/2018   Procedure: Insertion Of C3F8 Gas;  Surgeon: Bernarda Caffey, MD;  Location: [place];  Service: Ophthalmology;  Laterality: Right;   GASTRIC EMPTYING SCAN  04/20/2017   Mildly abnormal, particularly the 1st hour of emptying.   MEMBRANE PEEL Right 10/31/2018   Procedure: MEMBRANE PEEL;  Surgeon: Bernarda Caffey, MD;  Location: [place];  Service: Ophthalmology;  Laterality: Right;   PARS PLANA VITRECTOMY Right 04/12/2018   Procedure: Right PARS PLANA VITRECTOMY WITH 25 GAUGE with intravitreal antibiotics;  Surgeon: Bernarda Caffey, MD;  Location: [place];  Service: Ophthalmology;  Laterality: Right;  PARS PLANA VITRECTOMY Right 10/31/2018   Procedure: PARS PLANA VITRECTOMY WITH 25 GAUGE;  Surgeon: Bernarda Caffey, MD;  Location: [place];  Service: Ophthalmology;  Laterality: Right;   PHOTOCOAGULATION WITH LASER Right 10/31/2018   Procedure: Photocoagulation With Laser;  Surgeon: Bernarda Caffey, MD;  Location: [place];  Service: Ophthalmology;  Laterality: Right;   TRANSTHORACIC ECHOCARDIOGRAM  08/23/2020   Grd I DD, o/w normal.   FAMILY HISTORY Family History  Problem Relation Age of Onset   Brain cancer Mother    Diabetes Father    Diabetes Maternal Grandmother    Cataracts Maternal Grandmother    Cervical cancer Paternal Grandmother    Colon cancer Maternal Grandfather 25   Amblyopia Neg Hx    Blindness Neg Hx    Glaucoma Neg Hx    Macular degeneration Neg Hx    Retinal detachment Neg Hx    Strabismus Neg Hx    Retinitis pigmentosa Neg Hx    Esophageal cancer Neg Hx    Stomach cancer Neg Hx    Rectal cancer Neg Hx    SOCIAL HISTORY Social History   Tobacco Use   Smoking  status: Never   Smokeless tobacco: Never  Vaping Use   Vaping Use: Never used  Substance Use Topics   Alcohol use: No   Drug use: No       OPHTHALMIC EXAM:  Base Eye Exam     Visual Acuity (Snellen - Linear)       Right Left   Dist [place] 20/25 +2    Dist cc  20/30 -1   Dist ph [place] NI    Dist ph cc  20/25         Tonometry (Tonopen, 2:01 PM)       Right Left   Pressure 11 9         Pupils       Dark Light Shape React APD   Right 3 2 Round Brisk None   Left 3 2 Round Brisk None         Visual Fields       Left Right    Full Full         Extraocular Movement       Right Left    Full, Ortho Full, Ortho         Neuro/Psych     Oriented x3: Yes   Mood/Affect: Normal         Dilation     Both eyes: 1.0% Mydriacyl, 2.5% Phenylephrine @ 1:56 PM           Slit Lamp and Fundus Exam     Slit Lamp Exam       Right Left   Lids/Lashes Dermatochalasis - upper lid, mild Meibomian gland dysfunction, Telangiectasia Dermatochalasis - upper lid, Meibomian gland dysfunction, Telangiectasia   Conjunctiva/Sclera White and quiet White and quiet   Cornea Clear, well healed temporal cataract wounds Trace Punctate epithelial erosions, trace EBMD   Anterior Chamber Deep and quiet Deep and quiet   Iris Round and dilated, No NVI Round and dilated, No NVI   Lens PC IOL in good position, 1-2+ Posterior capsular opacification, PC folds 2-3+ Nuclear sclerosis with mild brunescence, 2-3+ Cortical cataract, 1+ Posterior subcapsular cataract   Anterior Vitreous post vitrectomy, trace pigment Vitreous syneresis, mild vitreous condensations clearing centrally and settling inferiorly -- essentially resolved         Fundus Exam       Right Left  Disc mild Pallor, Sharp rim, temporal PPA Pink and sharp, Compact, PPA   C/D Ratio 0.2 0.0   Macula Flat, blunted foveal reflex, mild cystic changes -- improved, punctate exudate - improved, scattered MA ST mac -- improved,  +focal laser changes Flat, good foveal reflex, trace cystic changes, scattered MA; trace ERM, light focal laser changes, no edema   Vessels attenuated, mild tortuosity attenuated, mild Tortuousity   Periphery Attached, rare MA, scattered DBH greatest posteriorly, 360 PRP, good laser fill in 360 attached, scattered IRH; 360 PRP scars -- with good fill in changes           Refraction     Wearing Rx       Sphere Cylinder   Right -3.75 Sphere   Left -3.75 Sphere           IMAGING AND PROCEDURES  Imaging and Procedures for   OCT, Retina - OU - Both Eyes       Right Eye Quality was good. Central Foveal Thickness: 341. Progression has been stable. Findings include no SRF, abnormal foveal contour, intraretinal hyper-reflective material, epiretinal membrane, intraretinal fluid (Persistent IRF temporal macula and fovea -- slightly improved).   Left Eye Quality was good. Central Foveal Thickness: 277. Progression has been stable. Findings include normal foveal contour, no IRF, no SRF, intraretinal hyper-reflective material (Stable resolution of central IRF/cystic changes, stable improvement in vitreous opacities, trace persistent cystic changes temporal macula ).   Notes *Images captured and stored on drive  Diagnosis / Impression:  DME OU OD: Persistent IRF temporal macula and fovea -- slightly improved OS: Stable resolution of central IRF/cystic changes, stable improvement in vitreous opacities, trace persistent cystic changes temporal macula   Clinical management:  See below  Abbreviations: NFP - Normal foveal profile. CME - cystoid macular edema. PED - pigment epithelial detachment. IRF - intraretinal fluid. SRF - subretinal fluid. EZ - ellipsoid zone. ERM - epiretinal membrane. ORA - outer retinal atrophy. ORT - outer retinal tubulation. SRHM - subretinal hyper-reflective material       Intravitreal Injection, Pharmacologic Agent - OD - Right Eye       Time  Out 12/06/2021. 2:29 PM. Confirmed correct patient, procedure, site, and patient consented.   Anesthesia Topical anesthesia was used. Anesthetic medications included Lidocaine 2%, Proparacaine 0.5%.   Procedure Preparation included 5% betadine to ocular surface, eyelid speculum. A (32g) needle was used.   Injection: 6 mg faricimab-svoa 6 MG/0.05ML   Route: Intravitreal, Site: Right Eye   NDC: [phone], Lot: [id number], Expiration date: 09/27/2023, Waste: 0 mL   Post-op Post injection exam found visual acuity of at least counting fingers. The patient tolerated the procedure well. There were no complications. The patient received written and verbal post procedure care education. Post injection medications were not given.            ASSESSMENT/PLAN:   ICD-10-CM   1. Proliferative diabetic retinopathy of both eyes with macular edema associated with type 2 diabetes mellitus (HCC)  E11.3513 OCT, Retina - OU - Both Eyes    Intravitreal Injection, Pharmacologic Agent - OD - Right Eye    faricimab-svoa (VABYSMO) 47m/0.05mL intravitreal injection    2. Vitreous hemorrhage of left eye (HCC)  H43.12     3. Right endophthalmia  H44.001     4. Vitreous hemorrhage, right eye ([place]  H43.11     5. Essential hypertension  I10     6. Hypertensive retinopathy of both eyes  H35.033  7. Combined forms of age-related cataract of left eye  H25.812     8. Pseudophakia of right eye  Z96.1      1.  Proliferative diabetic retinopathy w/ DME, OU  - HbA1c 7.1% (3.23.23), 8.6% (9.14.22), 7.8% (06.13.22), 9.1% (02.24.22) 11.0% (11.22.21) - s/p IVA OD #1 9.20.19, #2 (10.25.19), #3 (11.15.19), #4 (12.17.19), #5 (01.14.20), #6 (2.11.20), #7 (05.29.20), #8 (08.19.20), #9 (10.30.20), #10 (12.09.20), #11 (01.11.21), #12 (02.15.21), #13 (03.23.21), #14 (04.20.21), #15 (06.08.21), #16 (07.06.21) -- IVA resistance - s/p IVA OS #1 9.27.19, #2 (10.25.19), #3 (11.15.19), #4 (12.17.19), #5 (01.14.20), #6  (2.11.20), #7 (04.26.20), #8 (05.29.20), #9 (06.26.20), #10 (08.05.20), #11 (11.11.20), #12 (12.09.20), #13 (01.11.21), #14 (02.15.21), #15 (03.23.21), #16 (04.20.21), #17 (06.08.21) -- IVA resistance, #18 (09.27.21) - s/p IVE OD #1 (08.03.21) -- sample, #2 (09.10.21), #3 (10.08.21), #4 (11.23.21), #5 (12.21.21), #6 (01.19.22) sample, #7 (2.16.22), #8 (3.22.22), #9 (04.19.22), #10 (05.27.22), #11 (06.29.22), #12 (08.03.22), #13 (09.07.22), #14 (10.10.22), #15 (11.14.22), #16 (12.12.22), #17 (01.10.23) - sample, #18 (02.07.23), #19 (03.21.23), #20 (04.18.23), #21 (05.16.23) -- IVE resistance  - s/p IVE OS #1 (10.25.21), #2 (11.23.21), #3 (12.21.21), #4 (3.22.22), #5 (06.29.22), #6 (08.03.22), #7 (09.07.22), #8 (10.10.22), #9 (11.14.22), #10 (02.07.23), #11 (05.16.23), #12 (08.15.23)  - IVV OD #1 (06.16.23 -- sample), #2 (07.17.23), #3 (08.15.23), #4 (09.12.23)  - S/P PRP OS (09.20.19), (5.19.20), (08.19.20), (04.28.21), (02.02.22)  - S/P PRP OD (9.27.19 and 11.21.19), fill-in (04.14.20) (09.03.20, surgery)  - S/P focal laser OS (07.06.21), OD (09.20.22)  - FA (9.20.19) shows +NVE OU and leaking MA and capillary nonperfusion  - repeat FA 11.15.19 shows NV regressing OU  - pre-op: OD w/ VA stable at 20/25, but there is some preretinal fibrosis / tractional membranes just superior to disc and mild central DME  - s/p 25g PPV+MP+10% C3F8 gas OD (09.03.20) -- ERM/PRF removal OD  - BCVA 20/25 OU -- stable             - fibrosis/ERM stably improved; retina attached  - OCT shows OD: Persistent IRF temporal macula and fovea -- slightly improved; OS: Stable resolution of central IRF/cystic changes, stable improvement in vitreous opacities, trace persistent cystic changes temporal macula   - recommend IVV OD #5 today 10.10.23  - **OS on ~q47mmaintenance treatment schedule**  - pt in agreement  - RBA of procedure discussed, questions answered - see procedure note  - Eylea informed consent form re-signed and  scanned on 05.16.2023  - Vabysmo informed consent form signed on 06.16.23  - f/u 4 weeks, DFE, OCT, possible injection  2. Vitreous hemorrhage OS -- stably improved  - recurrent VH, onset 09.22.21  - etiology: secondary to PDR as described above (no RT/RD on exam)  - s/p PRP OS (9.20.19), (05.19.20), (08.19.20), (04.28.21), (02.02.22)  - s/p IVA OS on 4.26.20, 5.29.20, 6.26.20, 8.5.20,11.11.20, 12.06.20 and so on as above   - VH clear centrally and settled inferiorly -- now white  - BCVA stable at 20/25  - f/u 4 weeks DFE, OCT  3. History of Endophthalmitis OD  - s/p IVA OU 04/09/2018  - s/p 25g PPV w/ intravitreal vanc, ceftaz and cefepime OD, 2.14.2020  - s/p intravitreal tap / vanc and ceflaz injections (02.16.20)             - gram stain (2.14.20) shows G+ cocci, WBCs mostly PMNs;   - repeat gram stain from t/i (2.16.20) -- no organisms, just WBCs             -  cultures from vitreous grew rare Staph warneri; cultures from t/i -- no growth             - doing well, BCVA 20/25             - inflammation/posterior debris resolved             - IOP 14 off Brimonidine  - completed po pred taper -- caused significant elevations in BG  - monitor  4. History of Vitreous Hemorrhage OD -- cleared from PPV x2 for endophthalmitis and ERM/preretinal fibrosis   - secondary to PDR  5,6. Hypertensive retinopathy OU  - discussed importance of tight BP control.  - monitor  7. Combined form age related cataract OS-   - The symptoms of cataract, surgical options, and treatments and risks were discussed with patient.   - discussed diagnosis and progression  8. Pseudophakia OD  - s/p CE/IOL OD (Dr. Zenia Resides, 12.11.20)  - beautiful surgeries, doing well  Ophthalmic Meds Ordered this visit:  Meds ordered this encounter  Medications   faricimab-svoa (VABYSMO) 82m/0.05mL intravitreal injection     Return in about 4 weeks (around 01/03/2022) for f/u NPDR OU, DFE, OCT.  There are no Patient  Instructions on file for this visit.  This document serves as a record of services personally performed by BGardiner Sleeper MD, PhD. It was created on their behalf by CRenaldo Reel CHerrimanan ophthalmic technician. The creation of this record is the provider's dictation and/or activities during the visit.    Electronically signed by:  CRenaldo Reel COT  09.26.23 1:43 PM  This document serves as a record of services personally performed by BGardiner Sleeper MD, PhD. It was created on their behalf by ASan Jetty BOwens Shark OA an ophthalmic technician. The creation of this record is the provider's dictation and/or activities during the visit.    Electronically signed by: ASan Jetty BDorchester ONew [place]10.10.2023 1:43 PM   BGardiner Sleeper M.D., Ph.D. Diseases & Surgery of the Retina and Vitreous [facility] I have reviewed the above documentation for accuracy and completeness, and I agree with the above. BGardiner Sleeper M.D., Ph.D. 12/07/21 1:43 PM   Abbreviations: M myopia (nearsighted); A astigmatism; H hyperopia (farsighted); P presbyopia; Mrx spectacle prescription;  CTL contact lenses; OD right eye; OS left eye; OU both eyes  XT exotropia; ET esotropia; PEK punctate epithelial keratitis; PEE punctate epithelial erosions; DES dry eye syndrome; MGD meibomian gland dysfunction; ATs artificial tears; PFAT's preservative free artificial tears; NSneads Ferrynuclear sclerotic cataract; PSC posterior subcapsular cataract; ERM epi-retinal membrane; PVD posterior vitreous detachment; RD retinal detachment; DM diabetes mellitus; DR diabetic retinopathy; NPDR non-proliferative diabetic retinopathy; PDR proliferative diabetic retinopathy; CSME clinically significant macular edema; DME diabetic macular edema; dbh dot blot hemorrhages; CWS cotton wool spot; POAG primary open angle glaucoma; C/D cup-to-disc ratio; HVF humphrey visual field; GVF goldmann visual field; OCT optical coherence tomography; IOP  intraocular pressure; BRVO Branch retinal vein occlusion; CRVO central retinal vein occlusion; CRAO central retinal artery occlusion; BRAO branch retinal artery occlusion; RT retinal tear; SB scleral buckle; PPV pars plana vitrectomy; VH Vitreous hemorrhage; PRP panretinal laser photocoagulation; IVK intravitreal kenalog; VMT vitreomacular traction; MH Macular hole;  NVD neovascularization of the disc; NVE neovascularization elsewhere; AREDS age related eye disease study; ARMD age related macular degeneration; POAG primary open angle glaucoma; EBMD epithelial/anterior basement membrane dystrophy; ACIOL anterior chamber intraocular lens; IOL intraocular lens; PCIOL posterior chamber intraocular lens; Phaco/IOL  phacoemulsification with intraocular lens placement; [place] photorefractive keratectomy; LASIK laser assisted in situ keratomileusis; HTN hypertension; DM diabetes mellitus; COPD chronic obstructive pulmonary disease

## 2021-12-06 ENCOUNTER — Ambulatory Visit (INDEPENDENT_AMBULATORY_CARE_PROVIDER_SITE_OTHER): Payer: 59 | Admitting: Ophthalmology

## 2021-12-06 ENCOUNTER — Encounter (INDEPENDENT_AMBULATORY_CARE_PROVIDER_SITE_OTHER): Payer: Self-pay | Admitting: Ophthalmology

## 2021-12-06 DIAGNOSIS — E113513 Type 2 diabetes mellitus with proliferative diabetic retinopathy with macular edema, bilateral: Secondary | ICD-10-CM

## 2021-12-06 DIAGNOSIS — I10 Essential (primary) hypertension: Secondary | ICD-10-CM

## 2021-12-06 DIAGNOSIS — H25812 Combined forms of age-related cataract, left eye: Secondary | ICD-10-CM

## 2021-12-06 DIAGNOSIS — H44001 Unspecified purulent endophthalmitis, right eye: Secondary | ICD-10-CM

## 2021-12-06 DIAGNOSIS — H4313 Vitreous hemorrhage, bilateral: Secondary | ICD-10-CM | POA: Diagnosis not present

## 2021-12-06 DIAGNOSIS — H35033 Hypertensive retinopathy, bilateral: Secondary | ICD-10-CM

## 2021-12-06 DIAGNOSIS — Z961 Presence of intraocular lens: Secondary | ICD-10-CM | POA: Diagnosis not present

## 2021-12-06 DIAGNOSIS — H4311 Vitreous hemorrhage, right eye: Secondary | ICD-10-CM

## 2021-12-06 DIAGNOSIS — H4312 Vitreous hemorrhage, left eye: Secondary | ICD-10-CM

## 2021-12-06 MED ORDER — FARICIMAB-SVOA 6 MG/0.05ML IZ SOLN
6.0000 mg | INTRAVITREAL | Status: AC | PRN
  Administered 2021-12-06: 6 mg via INTRAVITREAL

## 2021-12-11 IMAGING — US US ABDOMEN COMPLETE
1 series · 14 of 25 positions shown · non-contrast
Comparison: 09/12/2017

CLINICAL DATA: [place]

Abdominal distension
Bloating
EXAM:
ABDOMEN ULTRASOUND COMPLETE

[Series 1: us abdomen complete · 14 of 72 slices shown]
[im 1/72]
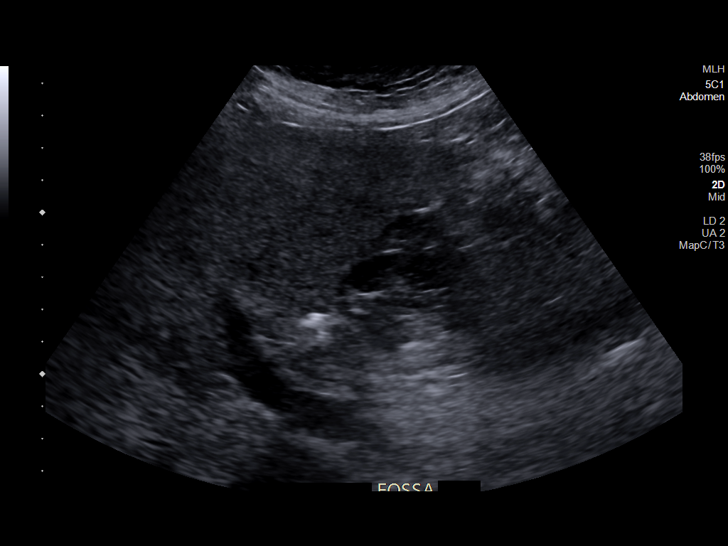
[im 6/72]
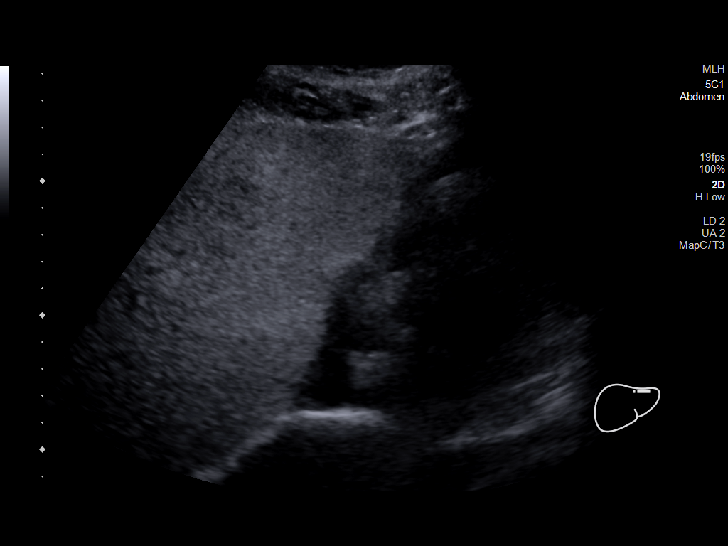
[im 12/72]
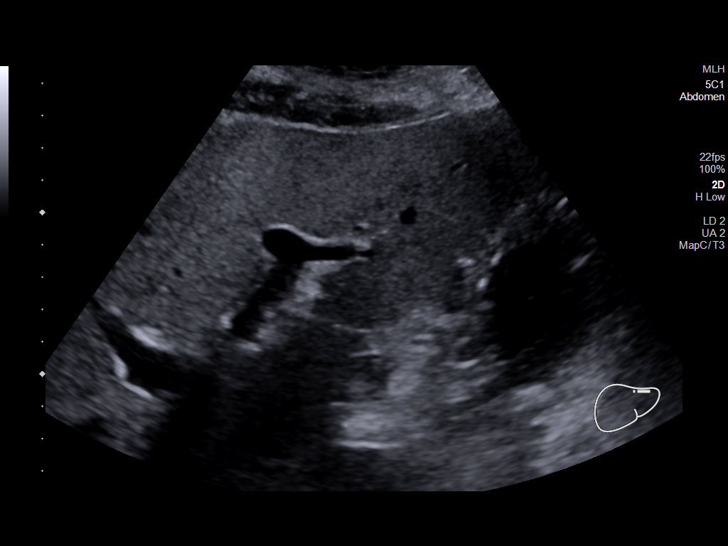
[im 18/72]
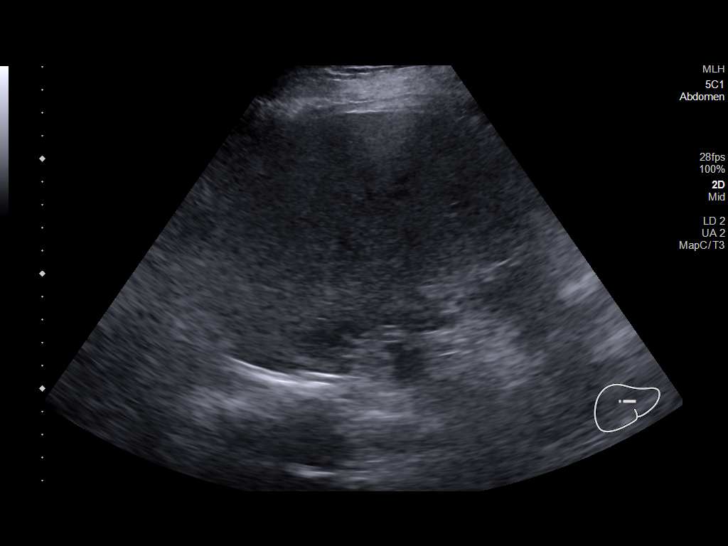
[im 24/72]
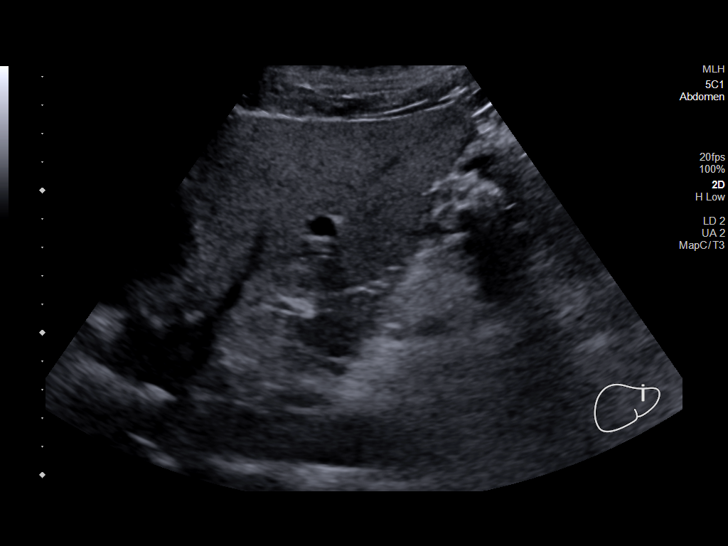
[im 27/72]
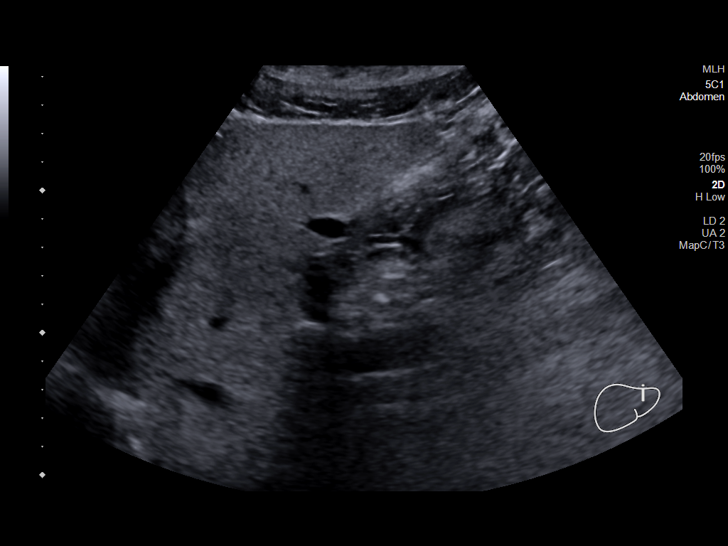
[im 33/72]
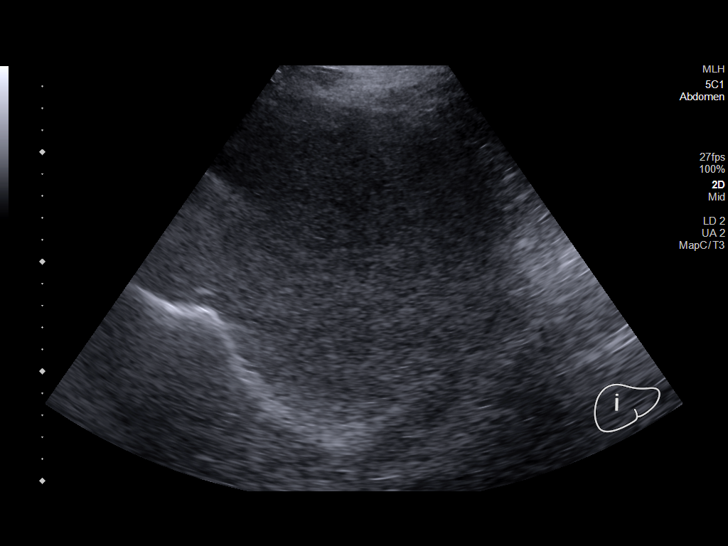
[im 39/72]
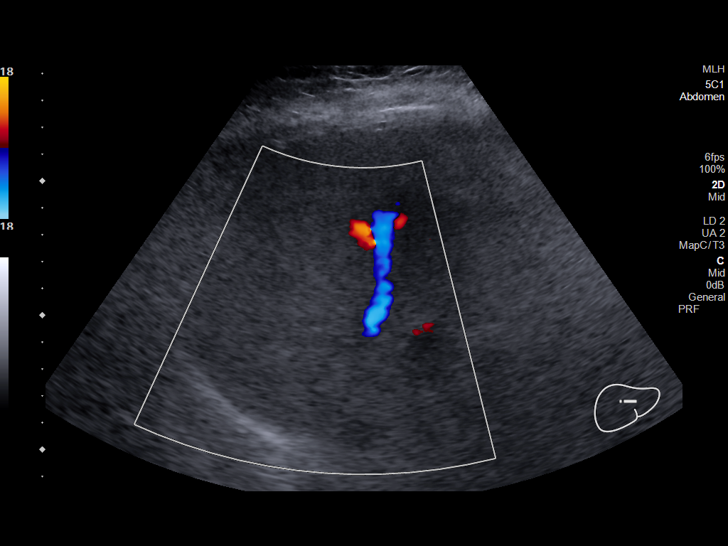
[im 45/72]
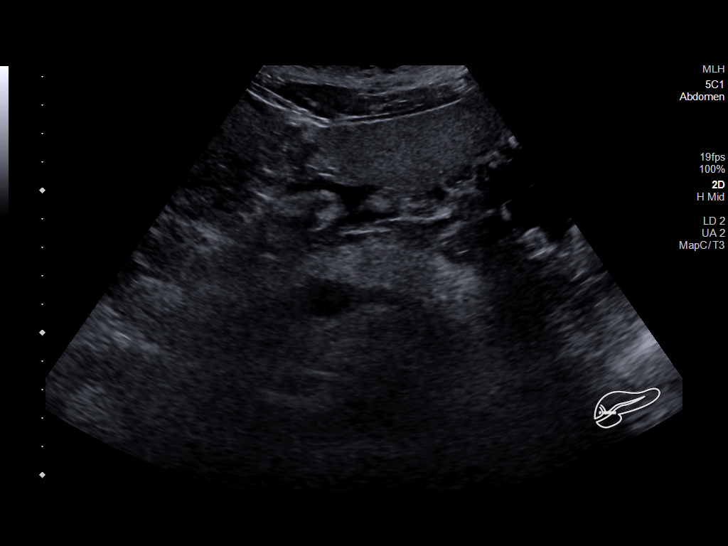
[im 48/72]
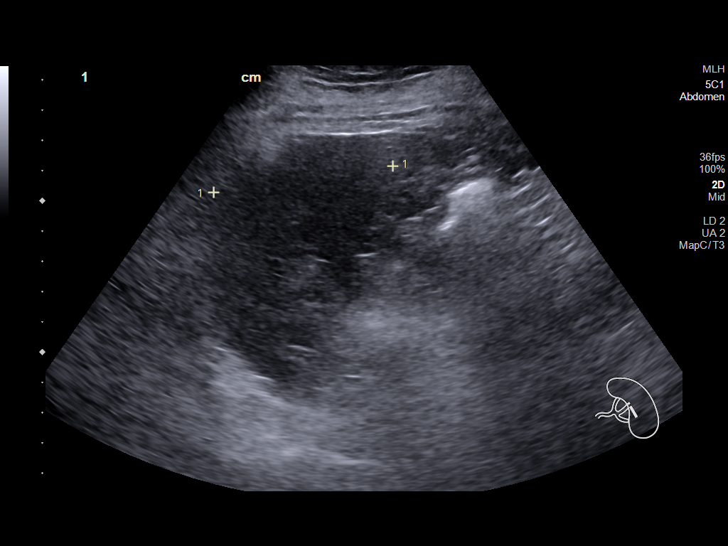
[im 54/72]
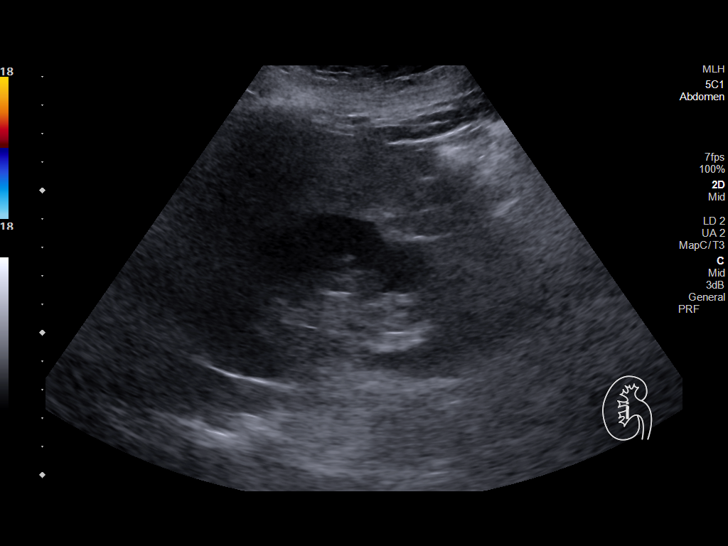
[im 60/72]
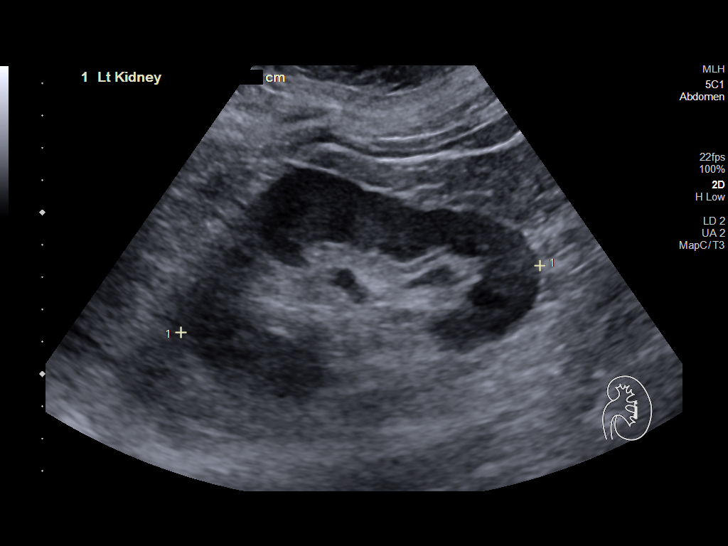
[im 66/72]
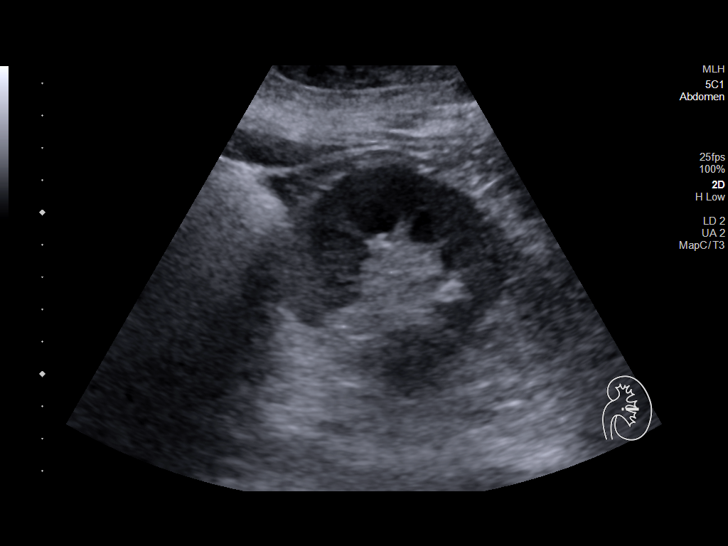
[im 72/72]
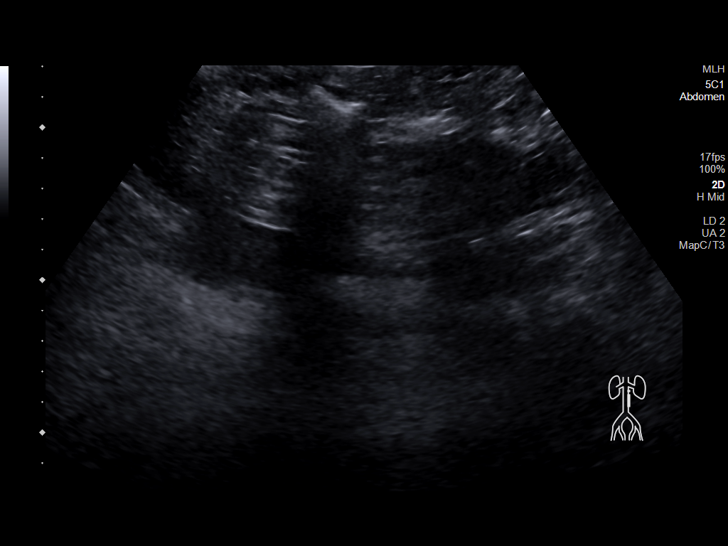

[14 of 25 positions shown; findings below may reference images not displayed]

FINDINGS: Gallbladder: Status post cholecystectomy.

Common bile duct: Diameter: 4 mm

Liver:

No focal lesion.

Diffusely increased parenchymal echogenicity.

Portal vein is patent on color Doppler imaging with normal direction
of blood flow towards the liver.

IVC: No abnormality visualized.

Pancreas: Visualized portion unremarkable.

Spleen: Size and appearance within normal limits.

Right Kidney: Length: 12.4 cm. Echogenicity within normal limits. No
mass or hydronephrosis visualized.

Left Kidney: Length: 11.3 cm. Echogenicity within normal limits. No
mass or hydronephrosis visualized.

Abdominal aorta: No aneurysm visualized.

Other findings: None.
IMPRESSION: Diffuse increased echogenicity of the hepatic parenchyma is a
nonspecific indicator of hepatocellular dysfunction, most commonly
steatosis.

## 2021-12-19 DIAGNOSIS — M353 Polymyalgia rheumatica: Secondary | ICD-10-CM | POA: Diagnosis not present

## 2021-12-19 LAB — COMPREHENSIVE METABOLIC PANEL
Albumin: 4.5 (ref 3.5–5.0)
Calcium: 10.2 (ref 8.7–10.7)
Globulin: 2.4
eGFR: 83

## 2021-12-19 LAB — CBC AND DIFFERENTIAL
HCT: 39 (ref 36–46)
Hemoglobin: 12.6 (ref 12.0–16.0)
Neutrophils Absolute: 6
Platelets: 495 10*3/uL — AB (ref 150–400)
WBC: 9.7

## 2021-12-19 LAB — HEPATIC FUNCTION PANEL
ALT: 64 U/L — AB (ref 7–35)
AST: 46 — AB (ref 13–35)
Alkaline Phosphatase: 113 (ref 25–125)

## 2021-12-19 LAB — BASIC METABOLIC PANEL
BUN: 19 (ref 4–21)
CO2: 20 (ref 13–22)
Chloride: 99 (ref 99–108)
Creatinine: 0.8 (ref 0.5–1.1)
Glucose: 115
Potassium: 4.4 mEq/L (ref 3.5–5.1)
Sodium: 140 (ref 137–147)

## 2021-12-19 LAB — CBC: RBC: 4.75 (ref 3.87–5.11)

## 2021-12-20 NOTE — Progress Notes (Shared)
[facility] Note  01/03/2022     CHIEF COMPLAINT Patient presents for No chief complaint on file.  HISTORY OF PRESENT ILLNESS: Gloria Gomez is a 51 y.o. female who presents to the clinic today for:   Pt states her vision is the same, her blood sugar has been good, her most recent A1c was 7.9, she goes back to see her endocrinologist in December  Referring physician:   HISTORICAL INFORMATION:   Selected notes from the [facility] Referred for DM exam   CURRENT MEDICATIONS: No current outpatient medications on file. (Ophthalmic Drugs)   No current facility-administered medications for this visit. (Ophthalmic Drugs)   Current Outpatient Medications (Other)  Medication Sig   amLODipine (NORVASC) 5 MG tablet Take 1 tablet (5 mg total) by mouth daily.   Continuous Blood Gluc Sensor (FREESTYLE LIBRE 2 SENSOR) MISC by Does not apply route.   FARXIGA 10 MG TABS tablet Take 10 mg by mouth daily.   furosemide (LASIX) 20 MG tablet TAKE 2 TABLETS BY MOUTH IN THE MORNING AND 1 IN THE EVENING   gabapentin (NEURONTIN) 300 MG capsule TAKE 1 CAPSULE BY MOUTH THREE TIMES DAILY   HYDROcodone-acetaminophen (NORCO/VICODIN) 5-325 MG tablet Take 1-2 tablets by mouth every 6 (six) hours as needed for moderate pain.   insulin aspart (NOVOLOG) 100 UNIT/ML injection Inject 10 Units into the skin 2 (two) times daily with a meal. Per endocrinologist   Insulin Glargine (BASAGLAR KWIKPEN) 100 UNIT/ML 15-18 units   losartan (COZAAR) 100 MG tablet Take 1 tablet (100 mg total) by mouth daily.   meloxicam (MOBIC) 15 MG tablet 1 tablet as needed   metFORMIN (GLUCOPHAGE) 1000 MG tablet TAKE 1 TABLET BY MOUTH TWICE DAILY WITH MEALS   metoCLOPramide (REGLAN) 5 MG tablet TAKE 1 TABLET BY MOUTH 4 TIMES DAILY BEFORE MEAL(S) AND AT BEDTIME   metoprolol succinate (TOPROL-XL) 100 MG 24 hr tablet Take 2 tablets by mouth once daily   Multiple Vitamin (MULTIVITAMIN WITH MINERALS)  TABS tablet Take 1 tablet by mouth daily.   pantoprazole (PROTONIX) 40 MG tablet TAKE 1 TABLET BY MOUTH ONCE DAILY .   promethazine (PHENERGAN) 12.5 MG tablet TAKE 1 TO 2 TABLETS BY MOUTH EVERY 6 HOURS AS NEEDED FOR NAUSEA   rosuvastatin (CRESTOR) 10 MG tablet Take 1 tablet (10 mg total) by mouth daily.   verapamil (CALAN-SR) 240 MG CR tablet Take 1 tablet (240 mg total) by mouth at bedtime.   VICTOZA 18 MG/3ML SOPN INJECT 1.2MG INTO THE SKIN DAILY (Patient taking differently: 1.8 mg. INJECT 1.2MG INTO THE SKIN DAILY)   No current facility-administered medications for this visit. (Other)   REVIEW OF SYSTEMS:   ALLERGIES Allergies  Allergen Reactions   Gluten Meal Swelling   Lisinopril Cough   Pioglitazone Other (See Comments)    ELEVATED glucoses + worse chronic nausea   PAST MEDICAL HISTORY Past Medical History:  Diagnosis Date   Abdominal bloating    likely from diab gastroparesis.  Dr. Havery Moros started trial of reglan 03/2019.   Benign brain tumor ([place])    Cystic lesion in cerebral aqueduct region with mild hydrocephalus-- stable MRI 02/2016.  Surveillance MRI 05/2017 --dilated cerebral aqueduct related to aqueductal stenosis and subsequent mild hydrocephalus (due to the 11 mm stable cystic lesion in cerebral aqueduct---?congenitial?.   Cataract    OU   Dysmenorrhea    vicodin occ during first 2 days of cycle.   Fibromyalgia    Gluten  intolerance    pt reports she underwent full GI w/u to r/o celiac dz   Hepatic steatosis    ultrasound 08/2017. Hx of very mild elevation of ALT.  Stable on u/s 06/2020   History of adenomatous polyp of colon 04/08/2019   recall Feb 2024   Hyperlipidemia, mixed    Hypertension    +white coat component   Hypertensive retinopathy of both eyes    Insomnia    Iron deficiency 01/2019   Hb 11.3. Hemoccults neg x 3 03/19/19. EGD and colonoscopy 04/08/19 showed NO cause for IDA.  Pt does have menorrhagia, though, so she'll see her GYN.  started FeSO4  325 qd approx 04/14/19.   Menorrhagia    resulting in IDA 2021   PMR (polymyalgia rheumatica) ([place]) 2022   question of; hx of elevated ESR-->better with prednisone but prednisone was contraindicated d/t eye issues/hyperglycemia-->rheum started following her 02/2021->plaquenil 04/2021   Proliferative diabetic retinopathy of both eyes ([place])    steroid injections 10/2017--improved   Sensorineural hearing loss of left ear    Sudden left hearing loss summer 2016--no improvement with steroids 01/2015 so brain MRI done by Dr. Redmond Baseman and it showed brain tumor that was determined to be benign.  Pt's hearing not bad enough for hearing aid as of 06/2016.   Type 2 diabetes with complication (HCC)    +microalbuminuria, diab retpthy, diabetic gastroparesis (gastric emptying study mildly abnl 03/2017).  Recommended lantus 08/2018 but pt declined. Mild microalbuminuria.   Uterine fibroid 2022   per pt report, pelvic u/s in GYN office   Past Surgical History:  Procedure Laterality Date   ANOSCOPY  05/12/2019   Procedure: normal exam, minimal hemorrhoid disease. Hyertrophied anal papila, benign appearing, posterior midline. Surgeon: Leighton Ruff MD   CHOLECYSTECTOMY  2000   COLONOSCOPY  04/08/2019   5 adenomas, recall 3 yrs; no cause for IDA found.  Hypertrophied anal papillae->bx showed low grade dysplasia; GI referred her to colorectal surgeon.   ESOPHAGOGASTRODUODENOSCOPY  04/08/2019   mild chronic reactive gastritis. H pylori NEG.  No cause for IDA found.   GAS INSERTION Right 10/31/2018   Procedure: Insertion Of C3F8 Gas;  Surgeon: Bernarda Caffey, MD;  Location: [facility];  Service: Ophthalmology;  Laterality: Right;   GASTRIC EMPTYING SCAN  04/20/2017   Mildly abnormal, particularly the 1st hour of emptying.   MEMBRANE PEEL Right 10/31/2018   Procedure: MEMBRANE PEEL;  Surgeon: Bernarda Caffey, MD;  Location: [facility];  Service: Ophthalmology;  Laterality: Right;   PARS PLANA VITRECTOMY Right 04/12/2018    Procedure: Right PARS PLANA VITRECTOMY WITH 25 GAUGE with intravitreal antibiotics;  Surgeon: Bernarda Caffey, MD;  Location: [facility];  Service: Ophthalmology;  Laterality: Right;   PARS PLANA VITRECTOMY Right 10/31/2018   Procedure: PARS PLANA VITRECTOMY WITH 25 GAUGE;  Surgeon: Bernarda Caffey, MD;  Location: [facility];  Service: Ophthalmology;  Laterality: Right;   PHOTOCOAGULATION WITH LASER Right 10/31/2018   Procedure: Photocoagulation With Laser;  Surgeon: Bernarda Caffey, MD;  Location: [facility];  Service: Ophthalmology;  Laterality: Right;   TRANSTHORACIC ECHOCARDIOGRAM  08/23/2020   Grd I DD, o/w normal.   FAMILY HISTORY Family History  Problem Relation Age of Onset   Brain cancer Mother    Diabetes Father    Diabetes Maternal Grandmother    Cataracts Maternal Grandmother    Cervical cancer Paternal Grandmother    Colon cancer Maternal Grandfather 74   Amblyopia Neg Hx    Blindness Neg Hx  Glaucoma Neg Hx    Macular degeneration Neg Hx    Retinal detachment Neg Hx    Strabismus Neg Hx    Retinitis pigmentosa Neg Hx    Esophageal cancer Neg Hx    Stomach cancer Neg Hx    Rectal cancer Neg Hx    SOCIAL HISTORY Social History   Tobacco Use   Smoking status: Never   Smokeless tobacco: Never  Vaping Use   Vaping Use: Never used  Substance Use Topics   Alcohol use: No   Drug use: No       OPHTHALMIC EXAM:  Not recorded    IMAGING AND PROCEDURES  Imaging and Procedures for           ASSESSMENT/PLAN:   ICD-10-CM   1. Proliferative diabetic retinopathy of both eyes with macular edema associated with type 2 diabetes mellitus ([place])  D32.6712     2. Vitreous hemorrhage of left eye (HCC)  H43.12     3. Vitreous hemorrhage, right eye ([place])  H43.11     4. Essential hypertension  I10     5. Right endophthalmia  H44.001     6. Hypertensive retinopathy of both eyes  H35.033     7. Combined forms of age-related cataract of left eye  H25.812     8. Pseudophakia of  right eye  Z96.1       1.  Proliferative diabetic retinopathy w/ DME, OU  - HbA1c 7.1% (3.23.23), 8.6% (9.14.22), 7.8% (06.13.22), 9.1% (02.24.22) 11.0% (11.22.21) - s/p IVA OD #1 9.20.19, #2 (10.25.19), #3 (11.15.19), #4 (12.17.19), #5 (01.14.20), #6 (2.11.20), #7 (05.29.20), #8 (08.19.20), #9 (10.30.20), #10 (12.09.20), #11 (01.11.21), #12 (02.15.21), #13 (03.23.21), #14 (04.20.21), #15 (06.08.21), #16 (07.06.21) -- IVA resistance - s/p IVA OS #1 9.27.19, #2 (10.25.19), #3 (11.15.19), #4 (12.17.19), #5 (01.14.20), #6 (2.11.20), #7 (04.26.20), #8 (05.29.20), #9 (06.26.20), #10 (08.05.20), #11 (11.11.20), #12 (12.09.20), #13 (01.11.21), #14 (02.15.21), #15 (03.23.21), #16 (04.20.21), #17 (06.08.21) -- IVA resistance, #18 (09.27.21) - s/p IVE OD #1 (08.03.21) -- sample, #2 (09.10.21), #3 (10.08.21), #4 (11.23.21), #5 (12.21.21), #6 (01.19.22) sample, #7 (2.16.22), #8 (3.22.22), #9 (04.19.22), #10 (05.27.22), #11 (06.29.22), #12 (08.03.22), #13 (09.07.22), #14 (10.10.22), #15 (11.14.22), #16 (12.12.22), #17 (01.10.23) - sample, #18 (02.07.23), #19 (03.21.23), #20 (04.18.23), #21 (05.16.23) -- IVE resistance  - s/p IVE OS #1 (10.25.21), #2 (11.23.21), #3 (12.21.21), #4 (3.22.22), #5 (06.29.22), #6 (08.03.22), #7 (09.07.22), #8 (10.10.22), #9 (11.14.22), #10 (02.07.23), #11 (05.16.23), #12 (08.15.23)  - IVV OD #1 (06.16.23 -- sample), #2 (07.17.23), #3 (08.15.23), #4 (09.12.23), #5 (10.10.23)  - S/P PRP OS (09.20.19), (5.19.20), (08.19.20), (04.28.21), (02.02.22)  - S/P PRP OD (9.27.19 and 11.21.19), fill-in (04.14.20) (09.03.20, surgery)  - S/P focal laser OS (07.06.21), OD (09.20.22)  - FA (9.20.19) shows +NVE OU and leaking MA and capillary nonperfusion  - repeat FA 11.15.19 shows NV regressing OU  - pre-op: OD w/ VA stable at 20/25, but there is some preretinal fibrosis / tractional membranes just superior to disc and mild central DME  - s/p 25g PPV+MP+10% C3F8 gas OD (09.03.20) -- ERM/PRF removal  OD  - BCVA 20/25 OU -- stable             - fibrosis/ERM stably improved; retina attached  - OCT shows OD: Persistent IRF temporal macula and fovea -- slightly improved; OS: Stable resolution of central IRF/cystic changes, stable improvement in vitreous opacities, trace persistent cystic changes temporal macula   - recommend IVV OD #6 today  11.07.23  - **OS on ~q12mmaintenance treatment schedule**  - pt in agreement  - RBA of procedure discussed, questions answered - see procedure note  - [facility] informed consent form re-signed and scanned on 05.16.2023  - Vabysmo informed consent form signed on 06.16.23  - f/u 4 weeks, DFE, OCT, possible injection  2. Vitreous hemorrhage OS -- stably improved  - recurrent VH, onset 09.22.21  - etiology: secondary to PDR as described above (no RT/RD on exam)  - s/p PRP OS (9.20.19), (05.19.20), (08.19.20), (04.28.21), (02.02.22)  - s/p IVA OS on 4.26.20, 5.29.20, 6.26.20, 8.5.20,11.11.20, 12.06.20 and so on as above   - VH clear centrally and settled inferiorly -- now white  - BCVA stable at 20/25  - f/u 4 weeks DFE, OCT  3. History of Endophthalmitis OD  - s/p IVA OU 04/09/2018  - s/p 25g PPV w/ intravitreal vanc, ceftaz and cefepime OD, 2.14.2020  - s/p intravitreal tap / vanc and ceflaz injections (02.16.20)             - gram stain (2.14.20) shows G+ cocci, WBCs mostly PMNs;   - repeat gram stain from t/i (2.16.20) -- no organisms, just WBCs             - cultures from vitreous grew rare Staph warneri; cultures from t/i -- no growth             - doing well, BCVA 20/25             - inflammation/posterior debris resolved             - IOP 14 off Brimonidine  - completed po pred taper -- caused significant elevations in BG  - monitor  4. History of Vitreous Hemorrhage OD -- cleared from PPV x2 for endophthalmitis and ERM/preretinal fibrosis   - secondary to PDR  5,6. Hypertensive retinopathy OU  - discussed importance of tight BP control.  -  monitor  7. Combined form age related cataract OS-   - The symptoms of cataract, surgical options, and treatments and risks were discussed with patient.   - discussed diagnosis and progression  8. Pseudophakia OD  - s/p CE/IOL OD (Dr. SZenia Resides 12.11.20)  - beautiful surgeries, doing well  Ophthalmic Meds Ordered this visit:  No orders of the defined types were placed in this encounter.    No follow-ups on file.  There are no Patient Instructions on file for this visit.  This document serves as a record of services personally performed by BGardiner Sleeper MD, PhD. It was created on their behalf by CRenaldo Reel [place]an ophthalmic technician. The creation of this record is the provider's dictation and/or activities during the visit.    Electronically signed by:  CRenaldo Reel COT  10.24.23 9:22 AM    BGardiner Sleeper M.D., Ph.D. Diseases & Surgery of the Retina and Vitreous [facility] [place] myopia (nearsighted); A astigmatism; H hyperopia (farsighted); P presbyopia; Mrx spectacle prescription;  CTL contact lenses; OD right eye; OS left eye; OU both eyes  XT exotropia; ET esotropia; PEK punctate epithelial keratitis; PEE punctate epithelial erosions; DES dry eye syndrome; MGD meibomian gland dysfunction; ATs artificial tears; PFAT's preservative free artificial tears; NSelznuclear sclerotic cataract; PSC posterior subcapsular cataract; ERM epi-retinal membrane; PVD posterior vitreous detachment; RD retinal detachment; DM diabetes mellitus; DR diabetic retinopathy; NPDR non-proliferative diabetic retinopathy; PDR proliferative diabetic retinopathy; CSME clinically significant macular edema; DME  diabetic macular edema; dbh dot blot hemorrhages; CWS cotton wool spot; POAG primary open angle glaucoma; C/D cup-to-disc ratio; HVF humphrey visual field; GVF goldmann visual field; OCT optical coherence tomography; IOP intraocular pressure; BRVO  Branch retinal vein occlusion; CRVO central retinal vein occlusion; CRAO central retinal artery occlusion; BRAO branch retinal artery occlusion; RT retinal tear; SB scleral buckle; PPV pars plana vitrectomy; VH Vitreous hemorrhage; PRP panretinal laser photocoagulation; IVK intravitreal kenalog; VMT vitreomacular traction; MH Macular hole;  NVD neovascularization of the disc; NVE neovascularization elsewhere; AREDS age related eye disease study; ARMD age related macular degeneration; POAG primary open angle glaucoma; EBMD epithelial/anterior basement membrane dystrophy; ACIOL anterior chamber intraocular lens; IOL intraocular lens; PCIOL posterior chamber intraocular lens; Phaco/IOL phacoemulsification with intraocular lens placement; Jasper photorefractive keratectomy; LASIK laser assisted in situ keratomileusis; HTN hypertension; DM diabetes mellitus; COPD chronic obstructive pulmonary disease

## 2022-01-03 ENCOUNTER — Encounter (INDEPENDENT_AMBULATORY_CARE_PROVIDER_SITE_OTHER): Payer: 59 | Admitting: Ophthalmology

## 2022-01-03 DIAGNOSIS — H4312 Vitreous hemorrhage, left eye: Secondary | ICD-10-CM

## 2022-01-03 DIAGNOSIS — H44001 Unspecified purulent endophthalmitis, right eye: Secondary | ICD-10-CM

## 2022-01-03 DIAGNOSIS — Z961 Presence of intraocular lens: Secondary | ICD-10-CM

## 2022-01-03 DIAGNOSIS — H25812 Combined forms of age-related cataract, left eye: Secondary | ICD-10-CM

## 2022-01-03 DIAGNOSIS — H4311 Vitreous hemorrhage, right eye: Secondary | ICD-10-CM

## 2022-01-03 DIAGNOSIS — I10 Essential (primary) hypertension: Secondary | ICD-10-CM

## 2022-01-03 DIAGNOSIS — H35033 Hypertensive retinopathy, bilateral: Secondary | ICD-10-CM

## 2022-01-03 DIAGNOSIS — E113513 Type 2 diabetes mellitus with proliferative diabetic retinopathy with macular edema, bilateral: Secondary | ICD-10-CM

## 2022-01-04 ENCOUNTER — Other Ambulatory Visit: Payer: Self-pay | Admitting: Family Medicine

## 2022-01-10 ENCOUNTER — Ambulatory Visit (INDEPENDENT_AMBULATORY_CARE_PROVIDER_SITE_OTHER): Payer: 59 | Admitting: Ophthalmology

## 2022-01-10 ENCOUNTER — Encounter (INDEPENDENT_AMBULATORY_CARE_PROVIDER_SITE_OTHER): Payer: Self-pay | Admitting: Ophthalmology

## 2022-01-10 DIAGNOSIS — E113513 Type 2 diabetes mellitus with proliferative diabetic retinopathy with macular edema, bilateral: Secondary | ICD-10-CM

## 2022-01-10 DIAGNOSIS — H35033 Hypertensive retinopathy, bilateral: Secondary | ICD-10-CM | POA: Diagnosis not present

## 2022-01-10 DIAGNOSIS — H4313 Vitreous hemorrhage, bilateral: Secondary | ICD-10-CM | POA: Diagnosis not present

## 2022-01-10 DIAGNOSIS — Z961 Presence of intraocular lens: Secondary | ICD-10-CM | POA: Diagnosis not present

## 2022-01-10 DIAGNOSIS — I10 Essential (primary) hypertension: Secondary | ICD-10-CM | POA: Diagnosis not present

## 2022-01-10 DIAGNOSIS — H25812 Combined forms of age-related cataract, left eye: Secondary | ICD-10-CM

## 2022-01-10 DIAGNOSIS — H4312 Vitreous hemorrhage, left eye: Secondary | ICD-10-CM

## 2022-01-10 DIAGNOSIS — H4311 Vitreous hemorrhage, right eye: Secondary | ICD-10-CM

## 2022-01-10 DIAGNOSIS — H44001 Unspecified purulent endophthalmitis, right eye: Secondary | ICD-10-CM

## 2022-01-10 MED ORDER — AFLIBERCEPT 2MG/0.05ML IZ SOLN FOR KALEIDOSCOPE
2.0000 mg | INTRAVITREAL | Status: AC | PRN
  Administered 2022-01-10: 2 mg via INTRAVITREAL

## 2022-01-10 MED ORDER — FARICIMAB-SVOA 6 MG/0.05ML IZ SOLN
6.0000 mg | INTRAVITREAL | Status: AC | PRN
  Administered 2022-01-10: 6 mg via INTRAVITREAL

## 2022-01-10 NOTE — Progress Notes (Signed)
.[facility] Note  01/10/2022     CHIEF COMPLAINT Patient presents for Retina Follow Up  HISTORY OF PRESENT ILLNESS: Gloria Gomez is a 51 y.o. female who presents to the clinic today for:  HPI     Retina Follow Up   Patient presents with  Diabetic Retinopathy.  In both eyes.  Severity is moderate.  Duration of 4 weeks.  Since onset it is stable.  I, the attending physician,  performed the HPI with the patient and updated documentation appropriately.        Comments   Patient a week late for appointment due to having the flu. Can tell vision has slightly decreased since extending for a week.       Last edited by Bernarda Caffey, MD on 01/10/2022 11:23 PM.    Pt states her vision is the same, her blood sugar has been good, her most recent A1c was 7.9, she goes back to see her endocrinologist in December  Referring physician:   HISTORICAL INFORMATION:   Selected notes from the [facility] Referred for DM exam   CURRENT MEDICATIONS: No current outpatient medications on file. (Ophthalmic Drugs)   No current facility-administered medications for this visit. (Ophthalmic Drugs)   Current Outpatient Medications (Other)  Medication Sig   amLODipine (NORVASC) 5 MG tablet Take 1 tablet (5 mg total) by mouth daily.   Continuous Blood Gluc Sensor (FREESTYLE LIBRE 2 SENSOR) MISC by Does not apply route.   FARXIGA 10 MG TABS tablet Take 10 mg by mouth daily.   furosemide (LASIX) 20 MG tablet TAKE 2 TABLETS BY MOUTH IN THE MORNING AND 1 IN THE EVENING   gabapentin (NEURONTIN) 300 MG capsule TAKE 1 CAPSULE BY MOUTH THREE TIMES DAILY   HYDROcodone-acetaminophen (NORCO/VICODIN) 5-325 MG tablet Take 1-2 tablets by mouth every 6 (six) hours as needed for moderate pain.   insulin aspart (NOVOLOG) 100 UNIT/ML injection Inject 10 Units into the skin 2 (two) times daily with a meal. Per endocrinologist   Insulin Glargine (BASAGLAR KWIKPEN) 100  UNIT/ML 15-18 units   losartan (COZAAR) 100 MG tablet Take 1 tablet (100 mg total) by mouth daily.   meloxicam (MOBIC) 15 MG tablet 1 tablet as needed   metFORMIN (GLUCOPHAGE) 1000 MG tablet TAKE 1 TABLET BY MOUTH TWICE DAILY WITH MEALS   metoCLOPramide (REGLAN) 5 MG tablet TAKE 1 TABLET BY MOUTH 4 TIMES DAILY BEFORE MEAL(S) AND AT BEDTIME   metoprolol succinate (TOPROL-XL) 100 MG 24 hr tablet Take 2 tablets by mouth once daily   Multiple Vitamin (MULTIVITAMIN WITH MINERALS) TABS tablet Take 1 tablet by mouth daily.   pantoprazole (PROTONIX) 40 MG tablet TAKE 1 TABLET BY MOUTH ONCE DAILY .   promethazine (PHENERGAN) 12.5 MG tablet TAKE 1 TO 2 TABLETS BY MOUTH EVERY 6 HOURS AS NEEDED FOR NAUSEA   rosuvastatin (CRESTOR) 10 MG tablet Take 1 tablet (10 mg total) by mouth daily.   verapamil (CALAN-SR) 240 MG CR tablet Take 1 tablet (240 mg total) by mouth at bedtime.   VICTOZA 18 MG/3ML SOPN INJECT 1.2MG INTO THE SKIN DAILY (Patient taking differently: 1.8 mg. INJECT 1.2MG INTO THE SKIN DAILY)   No current facility-administered medications for this visit. (Other)   REVIEW OF SYSTEMS: ROS   Positive for: Endocrine, Eyes Negative for: Constitutional, Gastrointestinal, Neurological, Skin, Genitourinary, Musculoskeletal, HENT, Cardiovascular, Respiratory, Psychiatric, Allergic/Imm, Heme/Lymph Last edited by Roselee Nova D, COT on 01/10/2022  1:55 PM.  ALLERGIES Allergies  Allergen Reactions   Gluten Meal Swelling   Lisinopril Cough   Pioglitazone Other (See Comments)    ELEVATED glucoses + worse chronic nausea   PAST MEDICAL HISTORY Past Medical History:  Diagnosis Date   Abdominal bloating    likely from diab gastroparesis.  Dr. Havery Moros started trial of reglan 03/2019.   Benign brain tumor ([place])    Cystic lesion in cerebral aqueduct region with mild hydrocephalus-- stable MRI 02/2016.  Surveillance MRI 05/2017 --dilated cerebral aqueduct related to aqueductal stenosis and subsequent  mild hydrocephalus (due to the 11 mm stable cystic lesion in cerebral aqueduct---?congenitial?.   Cataract    OU   Dysmenorrhea    vicodin occ during first 2 days of cycle.   Fibromyalgia    Gluten intolerance    pt reports she underwent full GI w/u to r/o celiac dz   Hepatic steatosis    ultrasound 08/2017. Hx of very mild elevation of ALT.  Stable on u/s 06/2020   History of adenomatous polyp of colon 04/08/2019   recall Feb 2024   Hyperlipidemia, mixed    Hypertension    +white coat component   Hypertensive retinopathy of both eyes    Insomnia    Iron deficiency 01/2019   Hb 11.3. Hemoccults neg x 3 03/19/19. EGD and colonoscopy 04/08/19 showed NO cause for IDA.  Pt does have menorrhagia, though, so she'll see her GYN.  started FeSO4 325 qd approx 04/14/19.   Menorrhagia    resulting in IDA 2021   PMR (polymyalgia rheumatica) ([place]) 2022   question of; hx of elevated ESR-->better with prednisone but prednisone was contraindicated d/t eye issues/hyperglycemia-->rheum started following her 02/2021->plaquenil 04/2021   Proliferative diabetic retinopathy of both eyes ([place])    steroid injections 10/2017--improved   Sensorineural hearing loss of left ear    Sudden left hearing loss summer 2016--no improvement with steroids 01/2015 so brain MRI done by Dr. Redmond Baseman and it showed brain tumor that was determined to be benign.  Pt's hearing not bad enough for hearing aid as of 06/2016.   Type 2 diabetes with complication (HCC)    +microalbuminuria, diab retpthy, diabetic gastroparesis (gastric emptying study mildly abnl 03/2017).  Recommended lantus 08/2018 but pt declined. Mild microalbuminuria.   Uterine fibroid 2022   per pt report, pelvic u/s in GYN office   Past Surgical History:  Procedure Laterality Date   ANOSCOPY  05/12/2019   Procedure: normal exam, minimal hemorrhoid disease. Hyertrophied anal papila, benign appearing, posterior midline. Surgeon: Leighton Ruff MD   CHOLECYSTECTOMY  2000    COLONOSCOPY  04/08/2019   5 adenomas, recall 3 yrs; no cause for IDA found.  Hypertrophied anal papillae->bx showed low grade dysplasia; GI referred her to colorectal surgeon.   ESOPHAGOGASTRODUODENOSCOPY  04/08/2019   mild chronic reactive gastritis. H pylori NEG.  No cause for IDA found.   GAS INSERTION Right 10/31/2018   Procedure: Insertion Of C3F8 Gas;  Surgeon: Bernarda Caffey, MD;  Location: [facility];  Service: Ophthalmology;  Laterality: Right;   GASTRIC EMPTYING SCAN  04/20/2017   Mildly abnormal, particularly the 1st hour of emptying.   MEMBRANE PEEL Right 10/31/2018   Procedure: MEMBRANE PEEL;  Surgeon: Bernarda Caffey, MD;  Location: [facility];  Service: Ophthalmology;  Laterality: Right;   PARS PLANA VITRECTOMY Right 04/12/2018   Procedure: Right PARS PLANA VITRECTOMY WITH 25 GAUGE with intravitreal antibiotics;  Surgeon: Bernarda Caffey, MD;  Location: [facility];  Service: Ophthalmology;  Laterality: Right;  PARS PLANA VITRECTOMY Right 10/31/2018   Procedure: PARS PLANA VITRECTOMY WITH 25 GAUGE;  Surgeon: Bernarda Caffey, MD;  Location: [facility];  Service: Ophthalmology;  Laterality: Right;   PHOTOCOAGULATION WITH LASER Right 10/31/2018   Procedure: Photocoagulation With Laser;  Surgeon: Bernarda Caffey, MD;  Location: [facility];  Service: Ophthalmology;  Laterality: Right;   TRANSTHORACIC ECHOCARDIOGRAM  08/23/2020   Grd I DD, o/w normal.   FAMILY HISTORY Family History  Problem Relation Age of Onset   Brain cancer Mother    Diabetes Father    Diabetes Maternal Grandmother    Cataracts Maternal Grandmother    Cervical cancer Paternal Grandmother    Colon cancer Maternal Grandfather 69   Amblyopia Neg Hx    Blindness Neg Hx    Glaucoma Neg Hx    Macular degeneration Neg Hx    Retinal detachment Neg Hx    Strabismus Neg Hx    Retinitis pigmentosa Neg Hx    Esophageal cancer Neg Hx    Stomach cancer Neg Hx    Rectal cancer Neg Hx    SOCIAL HISTORY Social History   Tobacco Use   Smoking  status: Never   Smokeless tobacco: Never  Vaping Use   Vaping Use: Never used  Substance Use Topics   Alcohol use: No   Drug use: No       OPHTHALMIC EXAM:  Base Eye Exam     Visual Acuity (Snellen - Linear)       Right Left   Dist [place] 20/30 +1    Dist cc  20/25 -1   Dist ph Fidelity NI    Dist ph cc  20/25         Tonometry (Tonopen, 2:06 PM)       Right Left   Pressure 15 15         Pupils       Dark Light Shape React APD   Right 3 2 Round Brisk None   Left 3 2 Round Brisk None         Visual Fields (Counting fingers)       Left Right    Full Full         Extraocular Movement       Right Left    Full, Ortho Full, Ortho         Neuro/Psych     Oriented x3: Yes   Mood/Affect: Normal         Dilation     Both eyes: 1.0% Mydriacyl, 2.5% Phenylephrine @ 2:07 PM           Slit Lamp and Fundus Exam     Slit Lamp Exam       Right Left   Lids/Lashes Dermatochalasis - upper lid, mild Meibomian gland dysfunction, Telangiectasia Dermatochalasis - upper lid, Meibomian gland dysfunction, Telangiectasia   Conjunctiva/Sclera White and quiet White and quiet   Cornea Clear, well healed temporal cataract wounds Trace Punctate epithelial erosions, trace EBMD   Anterior Chamber Deep and quiet Deep and quiet   Iris Round and dilated, No NVI Round and dilated, No NVI   Lens PC IOL in good position, 1-2+ Posterior capsular opacification, PC folds 2-3+ Nuclear sclerosis with mild brunescence, 2-3+ Cortical cataract, 1+ Posterior subcapsular cataract   Anterior Vitreous post vitrectomy, trace pigment Vitreous syneresis, mild vitreous condensations clearing centrally and settling inferiorly -- essentially resolved         Fundus Exam  Right Left   Disc mild Pallor, Sharp rim, temporal PPA Pink and sharp, Compact, PPA   C/D Ratio 0.2 0.0   Macula Flat, blunted foveal reflex, mild cystic changes -- slightly increased, focal MA and exudates ST mac,  +focal laser changes Flat, good foveal reflex, trace cystic changes -- slightly increased temporal fovea, scattered MA; trace ERM, light focal laser changes   Vessels attenuated, mild tortuosity attenuated, mild Tortuousity   Periphery Attached, rare MA, scattered DBH greatest posteriorly, 360 PRP, good laser fill in 360 attached, scattered IRH; 360 PRP scars -- with good fill in changes           IMAGING AND PROCEDURES  Imaging and Procedures for   OCT, Retina - OU - Both Eyes       Right Eye Quality was good. Central Foveal Thickness: 412. Progression has worsened. Findings include no SRF, abnormal foveal contour, intraretinal hyper-reflective material, epiretinal membrane, intraretinal fluid (Mild interval increase in IRF temporal macula and fovea ).   Left Eye Quality was good. Central Foveal Thickness: 298. Progression has worsened. Findings include no SRF, abnormal foveal contour, intraretinal hyper-reflective material, intraretinal fluid (Interval re-development of IRF/cystic changes temporal fovea, stable improvement in vitreous opacities).   Notes *Images captured and stored on drive  Diagnosis / Impression:  DME OU OD: Mild interval increase in IRF temporal macula and fovea  OS: Interval re-development of IRF/cystic changes temporal fovea, stable improvement in vitreous opacities  Clinical management:  See below  Abbreviations: NFP - Normal foveal profile. CME - cystoid macular edema. PED - pigment epithelial detachment. IRF - intraretinal fluid. SRF - subretinal fluid. EZ - ellipsoid zone. ERM - epiretinal membrane. ORA - outer retinal atrophy. ORT - outer retinal tubulation. SRHM - subretinal hyper-reflective material       Intravitreal Injection, Pharmacologic Agent - OD - Right Eye       Time Out 01/10/2022. 2:58 PM. Confirmed correct patient, procedure, site, and patient consented.   Anesthesia Topical anesthesia was used. Anesthetic medications included  Lidocaine 2%, Proparacaine 0.5%.   Procedure Preparation included 5% betadine to ocular surface, eyelid speculum. A (32g) needle was used.   Injection: 6 mg faricimab-svoa 6 MG/0.05ML   Route: Intravitreal, Site: Right Eye   NDC: [phone], Lot: [id number], Expiration date: 01/17/2024, Waste: 0 mL   Post-op Post injection exam found visual acuity of at least counting fingers. The patient tolerated the procedure well. There were no complications. The patient received written and verbal post procedure care education. Post injection medications were not given.      Intravitreal Injection, Pharmacologic Agent - OS - Left Eye       Time Out 01/10/2022. 3:08 PM. Confirmed correct patient, procedure, site, and patient consented.   Anesthesia Topical anesthesia was used. Anesthetic medications included Lidocaine 2%, Proparacaine 0.5%.   Procedure Preparation included 5% betadine to ocular surface, eyelid speculum. A (32g) needle was used.   Injection: 2 mg aflibercept 2 MG/0.05ML   Route: Intravitreal, Site: Left Eye   NDC: [id number], Lot: [id number], Expiration date: 07/28/2022, Waste: 0 mL   Post-op Post injection exam found visual acuity of at least counting fingers. The patient tolerated the procedure well. There were no complications. The patient received written and verbal post procedure care education. Post injection medications were not given.            ASSESSMENT/PLAN:   ICD-10-CM   1. Proliferative diabetic retinopathy of both eyes with macular edema associated with type 2 diabetes  mellitus ([place])  E11.3513 OCT, Retina - OU - Both Eyes    Intravitreal Injection, Pharmacologic Agent - OD - Right Eye    Intravitreal Injection, Pharmacologic Agent - OS - Left Eye    aflibercept (EYLEA) SOLN 2 mg    faricimab-svoa (VABYSMO) 56m/0.05mL intravitreal injection    2. Vitreous hemorrhage of left eye (HCC)  H43.12     3. Right endophthalmia  H44.001     4. Vitreous  hemorrhage, right eye ([place]  H43.11     5. Essential hypertension  I10     6. Hypertensive retinopathy of both eyes  H35.033     7. Combined forms of age-related cataract of left eye  H25.812     8. Pseudophakia of right eye  Z96.1      1.  Proliferative diabetic retinopathy w/ DME, OU  - HbA1c 7.1% (3.23.23), 8.6% (9.14.22), 7.8% (06.13.22), 9.1% (02.24.22) 11.0% (11.22.21) - s/p IVA OD #1 9.20.19, #2 (10.25.19), #3 (11.15.19), #4 (12.17.19), #5 (01.14.20), #6 (2.11.20), #7 (05.29.20), #8 (08.19.20), #9 (10.30.20), #10 (12.09.20), #11 (01.11.21), #12 (02.15.21), #13 (03.23.21), #14 (04.20.21), #15 (06.08.21), #16 (07.06.21) -- IVA resistance - s/p IVA OS #1 9.27.19, #2 (10.25.19), #3 (11.15.19), #4 (12.17.19), #5 (01.14.20), #6 (2.11.20), #7 (04.26.20), #8 (05.29.20), #9 (06.26.20), #10 (08.05.20), #11 (11.11.20), #12 (12.09.20), #13 (01.11.21), #14 (02.15.21), #15 (03.23.21), #16 (04.20.21), #17 (06.08.21) -- IVA resistance, #18 (09.27.21) - s/p IVE OD #1 (08.03.21) -- sample, #2 (09.10.21), #3 (10.08.21), #4 (11.23.21), #5 (12.21.21), #6 (01.19.22) sample, #7 (2.16.22), #8 (3.22.22), #9 (04.19.22), #10 (05.27.22), #11 (06.29.22), #12 (08.03.22), #13 (09.07.22), #14 (10.10.22), #15 (11.14.22), #16 (12.12.22), #17 (01.10.23) - sample, #18 (02.07.23), #19 (03.21.23), #20 (04.18.23), #21 (05.16.23) -- IVE resistance  - s/p IVE OS #1 (10.25.21), #2 (11.23.21), #3 (12.21.21), #4 (3.22.22), #5 (06.29.22), #6 (08.03.22), #7 (09.07.22), #8 (10.10.22), #9 (11.14.22), #10 (02.07.23), #11 (05.16.23), #12 (08.15.23)  - IVV OD #1 (06.16.23 -- sample), #2 (07.17.23), #3 (08.15.23), #4 (09.12.23), #5 (10.10.23)  - S/P PRP OS (09.20.19), (5.19.20), (08.19.20), (04.28.21), (02.02.22)  - S/P PRP OD (9.27.19 and 11.21.19), fill-in (04.14.20) (09.03.20, surgery)  - S/P focal laser OS (07.06.21), OD (09.20.22)  - FA (9.20.19) shows +NVE OU and leaking MA and capillary nonperfusion  - repeat FA 11.15.19 shows NV  regressing OU  - pre-op: OD w/ VA stable at 20/25, but there is some preretinal fibrosis / tractional membranes just superior to disc and mild central DME  - s/p 25g PPV+MP+10% C3F8 gas OD (09.03.20) -- ERM/PRF removal OD  - BCVA OD: 20/30 -- decreased, OS: 20/25 -- stable             - fibrosis/ERM stably improved; retina attached  - OCT shows OD: Mild interval increase in IRF temporal macula and fovea at 4 wks; OS: Interval re-development of IRF/cystic changes temporal fovea at 3 mos post-IVE, stable improvement in vitreous opacities  - recommend IVV OD #6 and IVE OS #13 today 11.14.23  - **OS on ~q37maintenance treatment schedule**  - pt in agreement  - RBA of procedure discussed, questions answered - see procedure note  - Eylea informed consent form re-signed and scanned on 05.16.2023  - Vabysmo informed consent form signed on 06.16.23  - f/u 4 weeks, DFE, OCT, possible injection  2. Vitreous hemorrhage OS -- stably improved  - recurrent VH, onset 09.22.21  - etiology: secondary to PDR as described above (no RT/RD on exam)  - s/p PRP OS (9.20.19), (05.19.20), (08.19.20), (04.28.21), (02.02.22)  -  s/p IVA OS on 4.26.20, 5.29.20, 6.26.20, 8.5.20,11.11.20, 12.06.20 and so on as above   - VH clear centrally and settled inferiorly -- now white  - BCVA stable at 20/25  - f/u 4 weeks DFE, OCT  3. History of Endophthalmitis OD  - s/p IVA OU 04/09/2018  - s/p 25g PPV w/ intravitreal vanc, ceftaz and cefepime OD, 2.14.2020  - s/p intravitreal tap / vanc and ceflaz injections (02.16.20)             - gram stain (2.14.20) shows G+ cocci, WBCs mostly PMNs;   - repeat gram stain from t/i (2.16.20) -- no organisms, just WBCs             - cultures from vitreous grew rare Staph warneri; cultures from t/i -- no growth             - doing well, BCVA 20/25             - inflammation/posterior debris resolved             - IOP 14 off Brimonidine  - completed po pred taper -- caused significant  elevations in BG  - monitor  4. History of Vitreous Hemorrhage OD -- cleared from PPV x2 for endophthalmitis and ERM/preretinal fibrosis   - secondary to PDR  5,6. Hypertensive retinopathy OU  - discussed importance of tight BP control.  - monitor  7. Combined form age related cataract OS-   - The symptoms of cataract, surgical options, and treatments and risks were discussed with patient.   - discussed diagnosis and progression  8. Pseudophakia OD  - s/p CE/IOL OD (Dr. Zenia Resides, 12.11.20)  - beautiful surgeries, doing well  Ophthalmic Meds Ordered this visit:  Meds ordered this encounter  Medications   aflibercept (EYLEA) SOLN 2 mg   faricimab-svoa (VABYSMO) 43m/0.05mL intravitreal injection     Return in about 4 weeks (around 02/07/2022) for f/u PDR OU, DFE, OCT.  There are no Patient Instructions on file for this visit.  This document serves as a record of services personally performed by BGardiner Sleeper MD, PhD. It was created on their behalf by DRoselee Nova COMT. The creation of this record is the provider's dictation and/or activities during the visit.  Electronically signed by: DRoselee Nova COMT 01/12/22 10:28 AM  This document serves as a record of services personally performed by BGardiner Sleeper MD, PhD. It was created on their behalf by ASan Jetty BOwens Shark OA an ophthalmic technician. The creation of this record is the provider's dictation and/or activities during the visit.    Electronically signed by: ASan Jetty BOwens Shark ONew [place]11.14.2023 10:28 AM  BGardiner Sleeper M.D., Ph.D. Diseases & Surgery of the Retina and Vitreous [facility] I have reviewed the above documentation for accuracy and completeness, and I agree with the above. BGardiner Sleeper M.D., Ph.D. 01/12/22 10:28 AM   Abbreviations: M myopia (nearsighted); A astigmatism; H hyperopia (farsighted); P presbyopia; Mrx spectacle prescription;  CTL contact lenses; OD right eye; OS left  eye; OU both eyes  XT exotropia; ET esotropia; PEK punctate epithelial keratitis; PEE punctate epithelial erosions; DES dry eye syndrome; MGD meibomian gland dysfunction; ATs artificial tears; PFAT's preservative free artificial tears; NSafety Harbornuclear sclerotic cataract; PSC posterior subcapsular cataract; ERM epi-retinal membrane; PVD posterior vitreous detachment; RD retinal detachment; DM diabetes mellitus; DR diabetic retinopathy; NPDR non-proliferative diabetic retinopathy; PDR proliferative diabetic retinopathy; CSME clinically significant macular edema; DME diabetic  macular edema; dbh dot blot hemorrhages; CWS cotton wool spot; POAG primary open angle glaucoma; C/D cup-to-disc ratio; HVF humphrey visual field; GVF goldmann visual field; OCT optical coherence tomography; IOP intraocular pressure; BRVO Branch retinal vein occlusion; CRVO central retinal vein occlusion; CRAO central retinal artery occlusion; BRAO branch retinal artery occlusion; RT retinal tear; SB scleral buckle; PPV pars plana vitrectomy; VH Vitreous hemorrhage; PRP panretinal laser photocoagulation; IVK intravitreal kenalog; VMT vitreomacular traction; MH Macular hole;  NVD neovascularization of the disc; NVE neovascularization elsewhere; AREDS age related eye disease study; ARMD age related macular degeneration; POAG primary open angle glaucoma; EBMD epithelial/anterior basement membrane dystrophy; ACIOL anterior chamber intraocular lens; IOL intraocular lens; PCIOL posterior chamber intraocular lens; Phaco/IOL phacoemulsification with intraocular lens placement; [place] photorefractive keratectomy; LASIK laser assisted in situ keratomileusis; HTN hypertension; DM diabetes mellitus; COPD chronic obstructive pulmonary disease

## 2022-01-16 NOTE — Patient Instructions (Incomplete)

## 2022-01-23 ENCOUNTER — Encounter: Payer: 59 | Admitting: Family Medicine

## 2022-01-23 DIAGNOSIS — I10 Essential (primary) hypertension: Secondary | ICD-10-CM

## 2022-01-23 DIAGNOSIS — E782 Mixed hyperlipidemia: Secondary | ICD-10-CM

## 2022-01-24 NOTE — Progress Notes (Signed)
.[facility] Note  02/07/2022     CHIEF COMPLAINT Patient presents for Retina Follow Up  HISTORY OF PRESENT ILLNESS: Gloria Gomez is a 51 y.o. female who presents to the clinic today for:  HPI     Retina Follow Up   Patient presents with  Diabetic Retinopathy.  In both eyes.  This started 4 weeks ago.  Severity is moderate.  Duration of 4 weeks.  Since onset it is stable.  I, the attending physician,  performed the HPI with the patient and updated documentation appropriately.        Comments   4 week retina follow PDR OU and IVV OD pt reporting no vision changes noticed       Last edited by Bernarda Caffey, MD on 02/07/2022 10:23 PM.     Pt states vision is stable  Referring physician:   HISTORICAL INFORMATION:   Selected notes from the MEDICAL RECORD NUMBER Referred for DM exam   CURRENT MEDICATIONS: No current outpatient medications on file. (Ophthalmic Drugs)   No current facility-administered medications for this visit. (Ophthalmic Drugs)   Current Outpatient Medications (Other)  Medication Sig   amLODipine (NORVASC) 5 MG tablet Take 1 tablet (5 mg total) by mouth daily.   Continuous Blood Gluc Sensor (FREESTYLE LIBRE 2 SENSOR) MISC by Does not apply route.   FARXIGA 10 MG TABS tablet Take 10 mg by mouth daily.   furosemide (LASIX) 20 MG tablet TAKE 2 TABLETS BY MOUTH IN THE MORNING AND 1 IN THE EVENING   gabapentin (NEURONTIN) 300 MG capsule TAKE 1 CAPSULE BY MOUTH THREE TIMES DAILY   HYDROcodone-acetaminophen (NORCO/VICODIN) 5-325 MG tablet Take 1-2 tablets by mouth every 6 (six) hours as needed for moderate pain.   insulin aspart (NOVOLOG) 100 UNIT/ML injection Inject 10 Units into the skin 2 (two) times daily with a meal. Per endocrinologist   Insulin Glargine (BASAGLAR KWIKPEN) 100 UNIT/ML 15-18 units   losartan (COZAAR) 100 MG tablet Take 1 tablet (100 mg total) by mouth daily.   meloxicam (MOBIC) 15 MG tablet 1 tablet as  needed   metFORMIN (GLUCOPHAGE) 1000 MG tablet TAKE 1 TABLET BY MOUTH TWICE DAILY WITH MEALS   metoCLOPramide (REGLAN) 5 MG tablet TAKE 1 TABLET BY MOUTH 4 TIMES DAILY BEFORE MEAL(S) AND AT BEDTIME   metoprolol succinate (TOPROL-XL) 100 MG 24 hr tablet Take 2 tablets by mouth once daily   Multiple Vitamin (MULTIVITAMIN WITH MINERALS) TABS tablet Take 1 tablet by mouth daily.   pantoprazole (PROTONIX) 40 MG tablet TAKE 1 TABLET BY MOUTH ONCE DAILY .   promethazine (PHENERGAN) 12.5 MG tablet TAKE 1 TO 2 TABLETS BY MOUTH EVERY 6 HOURS AS NEEDED FOR NAUSEA   rosuvastatin (CRESTOR) 10 MG tablet Take 1 tablet (10 mg total) by mouth daily.   verapamil (CALAN-SR) 240 MG CR tablet Take 1 tablet (240 mg total) by mouth at bedtime.   VICTOZA 18 MG/3ML SOPN INJECT 1.2MG INTO THE SKIN DAILY (Patient taking differently: 1.8 mg. INJECT 1.2MG INTO THE SKIN DAILY)   No current facility-administered medications for this visit. (Other)   REVIEW OF SYSTEMS:   ALLERGIES Allergies  Allergen Reactions   Gluten Meal Swelling   Lisinopril Cough   Pioglitazone Other (See Comments)    ELEVATED glucoses + worse chronic nausea   PAST MEDICAL HISTORY Past Medical History:  Diagnosis Date   Abdominal bloating    likely from diab gastroparesis.  Dr. Havery Moros started trial of  reglan 03/2019.   Benign brain tumor ([place])    Cystic lesion in cerebral aqueduct region with mild hydrocephalus-- stable MRI 02/2016.  Surveillance MRI 05/2017 --dilated cerebral aqueduct related to aqueductal stenosis and subsequent mild hydrocephalus (due to the 11 mm stable cystic lesion in cerebral aqueduct---?congenitial?.   Cataract    OU   Dysmenorrhea    vicodin occ during first 2 days of cycle.   Fibromyalgia    Gluten intolerance    pt reports she underwent full GI w/u to r/o celiac dz   Hepatic steatosis    ultrasound 08/2017. Hx of very mild elevation of ALT.  Stable on u/s 06/2020   History of adenomatous polyp of colon  04/08/2019   recall Feb 2024   Hyperlipidemia, mixed    Hypertension    +white coat component   Hypertensive retinopathy of both eyes    Insomnia    Iron deficiency 01/2019   Hb 11.3. Hemoccults neg x 3 03/19/19. EGD and colonoscopy 04/08/19 showed NO cause for IDA.  Pt does have menorrhagia, though, so she'll see her GYN.  started FeSO4 325 qd approx 04/14/19.   Menorrhagia    resulting in IDA 2021   PMR (polymyalgia rheumatica) ([place]) 2022   question of; hx of elevated ESR-->better with prednisone but prednisone was contraindicated d/t eye issues/hyperglycemia-->rheum started following her 02/2021->plaquenil 04/2021   Proliferative diabetic retinopathy of both eyes ([place])    steroid injections 10/2017--improved   Sensorineural hearing loss of left ear    Sudden left hearing loss summer 2016--no improvement with steroids 01/2015 so brain MRI done by Dr. Redmond Baseman and it showed brain tumor that was determined to be benign.  Pt's hearing not bad enough for hearing aid as of 06/2016.   Type 2 diabetes with complication (HCC)    +microalbuminuria, diab retpthy, diabetic gastroparesis (gastric emptying study mildly abnl 03/2017).  Recommended lantus 08/2018 but pt declined. Mild microalbuminuria.   Uterine fibroid 2022   per pt report, pelvic u/s in GYN office   Past Surgical History:  Procedure Laterality Date   ANOSCOPY  05/12/2019   Procedure: normal exam, minimal hemorrhoid disease. Hyertrophied anal papila, benign appearing, posterior midline. Surgeon: Leighton Ruff MD   CHOLECYSTECTOMY  2000   COLONOSCOPY  04/08/2019   5 adenomas, recall 3 yrs; no cause for IDA found.  Hypertrophied anal papillae->bx showed low grade dysplasia; GI referred her to colorectal surgeon.   ESOPHAGOGASTRODUODENOSCOPY  04/08/2019   mild chronic reactive gastritis. H pylori NEG.  No cause for IDA found.   GAS INSERTION Right 10/31/2018   Procedure: Insertion Of C3F8 Gas;  Surgeon: Bernarda Caffey, MD;  Location: [place];   Service: Ophthalmology;  Laterality: Right;   GASTRIC EMPTYING SCAN  04/20/2017   Mildly abnormal, particularly the 1st hour of emptying.   MEMBRANE PEEL Right 10/31/2018   Procedure: MEMBRANE PEEL;  Surgeon: Bernarda Caffey, MD;  Location: [place];  Service: Ophthalmology;  Laterality: Right;   PARS PLANA VITRECTOMY Right 04/12/2018   Procedure: Right PARS PLANA VITRECTOMY WITH 25 GAUGE with intravitreal antibiotics;  Surgeon: Bernarda Caffey, MD;  Location: [place];  Service: Ophthalmology;  Laterality: Right;   PARS PLANA VITRECTOMY Right 10/31/2018   Procedure: PARS PLANA VITRECTOMY WITH 25 GAUGE;  Surgeon: Bernarda Caffey, MD;  Location: [place];  Service: Ophthalmology;  Laterality: Right;   PHOTOCOAGULATION WITH LASER Right 10/31/2018   Procedure: Photocoagulation With Laser;  Surgeon: Bernarda Caffey, MD;  Location: [place];  Service: Ophthalmology;  Laterality:  Right;   TRANSTHORACIC ECHOCARDIOGRAM  08/23/2020   Grd I DD, o/w normal.   FAMILY HISTORY Family History  Problem Relation Age of Onset   Brain cancer Mother    Diabetes Father    Diabetes Maternal Grandmother    Cataracts Maternal Grandmother    Cervical cancer Paternal Grandmother    Colon cancer Maternal Grandfather 2   Amblyopia Neg Hx    Blindness Neg Hx    Glaucoma Neg Hx    Macular degeneration Neg Hx    Retinal detachment Neg Hx    Strabismus Neg Hx    Retinitis pigmentosa Neg Hx    Esophageal cancer Neg Hx    Stomach cancer Neg Hx    Rectal cancer Neg Hx    SOCIAL HISTORY Social History   Tobacco Use   Smoking status: Never   Smokeless tobacco: Never  Vaping Use   Vaping Use: Never used  Substance Use Topics   Alcohol use: No   Drug use: No       OPHTHALMIC EXAM:  Base Eye Exam     Visual Acuity (Snellen - Linear)       Right Left   Dist [place] 20/30    Dist cc  20/30 -2   Dist ph [place] NI    Dist ph cc  20/25 -2         Tonometry (Tonopen, 2:50 PM)       Right Left   Pressure 15 14          Pupils       Pupils Dark Light Shape React APD   Right PERRL 3 2 Round Brisk None   Left PERRL 3 2 Round Brisk None         Visual Fields       Left Right    Full Full         Extraocular Movement       Right Left    Full, Ortho Full, Ortho         Neuro/Psych     Oriented x3: Yes   Mood/Affect: Normal         Dilation     Both eyes: 2.5% Phenylephrine @ 2:50 PM           Slit Lamp and Fundus Exam     Slit Lamp Exam       Right Left   Lids/Lashes Dermatochalasis - upper lid, mild Meibomian gland dysfunction, Telangiectasia Dermatochalasis - upper lid, Meibomian gland dysfunction, Telangiectasia   Conjunctiva/Sclera White and quiet White and quiet   Cornea Clear, well healed temporal cataract wounds Trace Punctate epithelial erosions, trace EBMD   Anterior Chamber Deep and quiet Deep and quiet   Iris Round and dilated, No NVI Round and dilated, No NVI   Lens PC IOL in good position, 1-2+ Posterior capsular opacification, PC folds 2-3+ Nuclear sclerosis with mild brunescence, 2-3+ Cortical cataract, 1+ Posterior subcapsular cataract   Anterior Vitreous post vitrectomy, trace pigment Vitreous syneresis, mild vitreous condensations, no heme         Fundus Exam       Right Left   Disc mild Pallor, Sharp rim, temporal PPA Pink and sharp, Compact, PPA   C/D Ratio 0.2 0.0   Macula Flat, blunted foveal reflex, mild cystic changes -- slightly improved, focal MA and exudates ST mac, +focal laser changes Flat, good foveal reflex, trace cystic changes -- improved temporal fovea, scattered MA; trace ERM, light focal laser changes  Vessels attenuated, Tortuous attenuated, Tortuous   Periphery Attached, rare MA, scattered DBH greatest posteriorly, 360 PRP, good laser fill in 360 attached, scattered IRH; 360 PRP scars -- with good fill in changes           Refraction     Wearing Rx       Sphere Cylinder   Right -3.75 Sphere   Left -3.75 Sphere            IMAGING AND PROCEDURES  Imaging and Procedures for   OCT, Retina - OU - Both Eyes       Right Eye Quality was good. Central Foveal Thickness: 412. Progression has improved. Findings include no SRF, abnormal foveal contour, intraretinal hyper-reflective material, epiretinal membrane, intraretinal fluid (Mild interval improvement in IRF/cystic changes temporal macula and fovea ).   Left Eye Quality was good. Central Foveal Thickness: 279. Progression has improved. Findings include normal foveal contour, no IRF, no SRF, intraretinal hyper-reflective material (Interval improvement in IRF/cystic changes temporal fovea, stable improvement in vitreous opacities).   Notes *Images captured and stored on drive  Diagnosis / Impression:  DME OU OD: Mild interval improvement in IRF/cystic changes temporal macula and fovea  OS: Interval improvement in IRF/cystic changes temporal fovea, stable improvement in vitreous opacities  Clinical management:  See below  Abbreviations: NFP - Normal foveal profile. CME - cystoid macular edema. PED - pigment epithelial detachment. IRF - intraretinal fluid. SRF - subretinal fluid. EZ - ellipsoid zone. ERM - epiretinal membrane. ORA - outer retinal atrophy. ORT - outer retinal tubulation. SRHM - subretinal hyper-reflective material       Intravitreal Injection, Pharmacologic Agent - OD - Right Eye       Time Out 02/07/2022. 3:15 PM. Confirmed correct patient, procedure, site, and patient consented.   Anesthesia Topical anesthesia was used. Anesthetic medications included Lidocaine 2%, Proparacaine 0.5%.   Procedure Preparation included 5% betadine to ocular surface, eyelid speculum. A (32g) needle was used.   Injection: 6 mg faricimab-svoa 6 MG/0.05ML   Route: Intravitreal, Site: Right Eye   NDC: [id number], Lot: [id number], Expiration date: 02/26/2024, Waste: 0 mL   Post-op Post injection exam found visual acuity of at least counting  fingers. The patient tolerated the procedure well. There were no complications. The patient received written and verbal post procedure care education. Post injection medications were not given.            ASSESSMENT/PLAN:   ICD-10-CM   1. Proliferative diabetic retinopathy of both eyes with macular edema associated with type 2 diabetes mellitus (HCC)  E11.3513 OCT, Retina - OU - Both Eyes    Intravitreal Injection, Pharmacologic Agent - OD - Right Eye    faricimab-svoa (VABYSMO) 2m/0.05mL intravitreal injection    2. Vitreous hemorrhage of left eye (HCC)  H43.12     3. Right endophthalmia  H44.001     4. Vitreous hemorrhage, right eye (HRobert Lee  H43.11     5. Essential hypertension  I10     6. Hypertensive retinopathy of both eyes  H35.033     7. Combined forms of age-related cataract of left eye  H25.812     8. Pseudophakia of right eye  Z96.1      1.  Proliferative diabetic retinopathy w/ DME, OU  - HbA1c 7.1% (3.23.23), 8.6% (9.14.22), 7.8% (06.13.22), 9.1% (02.24.22) 11.0% (11.22.21) - s/p IVA OD #1 9.20.19, #2 (10.25.19), #3 (11.15.19), #4 (12.17.19), #5 (01.14.20), #6 (2.11.20), #7 (05.29.20), #8 (08.19.20), #9 (10.30.20), #10 (12.09.20), #11 (  01.11.21), #12 (02.15.21), #13 (03.23.21), #14 (04.20.21), #15 (06.08.21), #16 (07.06.21) -- IVA resistance - s/p IVA OS #1 9.27.19, #2 (10.25.19), #3 (11.15.19), #4 (12.17.19), #5 (01.14.20), #6 (2.11.20), #7 (04.26.20), #8 (05.29.20), #9 (06.26.20), #10 (08.05.20), #11 (11.11.20), #12 (12.09.20), #13 (01.11.21), #14 (02.15.21), #15 (03.23.21), #16 (04.20.21), #17 (06.08.21) -- IVA resistance, #18 (09.27.21) - s/p IVE OD #1 (08.03.21) -- sample, #2 (09.10.21), #3 (10.08.21), #4 (11.23.21), #5 (12.21.21), #6 (01.19.22) sample, #7 (2.16.22), #8 (3.22.22), #9 (04.19.22), #10 (05.27.22), #11 (06.29.22), #12 (08.03.22), #13 (09.07.22), #14 (10.10.22), #15 (11.14.22), #16 (12.12.22), #17 (01.10.23) - sample, #18 (02.07.23), #19 (03.21.23), #20  (04.18.23), #21 (05.16.23) -- IVE resistance  - s/p IVE OS #1 (10.25.21), #2 (11.23.21), #3 (12.21.21), #4 (3.22.22), #5 (06.29.22), #6 (08.03.22), #7 (09.07.22), #8 (10.10.22), #9 (11.14.22), #10 (02.07.23), #11 (05.16.23), #12 (08.15.23), #13 (11.14.23)  - IVV OD #1 (06.16.23 -- sample), #2 (07.17.23), #3 (08.15.23), #4 (09.12.23), #5 (10.10.23), #6 (11.14.23)  - S/P PRP OS (09.20.19), (5.19.20), (08.19.20), (04.28.21), (02.02.22)  - S/P PRP OD (9.27.19 and 11.21.19), fill-in (04.14.20) (09.03.20, surgery)  - S/P focal laser OS (07.06.21), OD (09.20.22)  - FA (9.20.19) shows +NVE OU and leaking MA and capillary nonperfusion  - repeat FA 11.15.19 shows NV regressing OU  - pre-op: OD w/ VA stable at 20/25, but there is some preretinal fibrosis / tractional membranes just superior to disc and mild central DME  - s/p 25g PPV+MP+10% C3F8 gas OD (09.03.20) -- ERM/PRF removal OD  - BCVA OD: 20/30 -- stable, OS: 20/25 -- stable             - fibrosis/ERM stably improved; retina attached  - OCT shows OD: Mild interval improvement in IRF/cystic changes temporal macula and fovea; OS: Interval improvement in IRF/cystic changes temporal fovea, stable improvement in vitreous opacities  - recommend IVV OD #7 today 12.12.23 -- will hold off on OS today  - **OS on ~q62mmaintenance treatment schedule**  - pt in agreement  - RBA of procedure discussed, questions answered - see procedure note  - Eylea informed consent form re-signed and scanned on 05.16.2023  - Vabysmo informed consent form signed on 06.16.23  - f/u 4 weeks, DFE, OCT, possible injection  2. Vitreous hemorrhage OS -- stably improved  - recurrent VH, onset 09.22.21  - etiology: secondary to PDR as described above (no RT/RD on exam)  - s/p PRP OS (9.20.19), (05.19.20), (08.19.20), (04.28.21), (02.02.22)  - s/p IVA OS on 4.26.20, 5.29.20, 6.26.20, 8.5.20,11.11.20, 12.06.20 and so on as above   - VH clear centrally and settled inferiorly -- now  white  - BCVA stable at 20/25  - f/u 4 weeks DFE, OCT  3. History of Endophthalmitis OD  - s/p IVA OU 04/09/2018  - s/p 25g PPV w/ intravitreal vanc, ceftaz and cefepime OD, 2.14.2020  - s/p intravitreal tap / vanc and ceflaz injections (02.16.20)             - gram stain (2.14.20) shows G+ cocci, WBCs mostly PMNs;   - repeat gram stain from t/i (2.16.20) -- no organisms, just WBCs             - cultures from vitreous grew rare Staph warneri; cultures from t/i -- no growth             - doing well, BCVA 20/30             - inflammation/posterior debris resolved             -  IOP 15 off Brimonidine  - completed po pred taper -- caused significant elevations in BG  - monitor  4. History of Vitreous Hemorrhage OD -- cleared from PPV x2 for endophthalmitis and ERM/preretinal fibrosis   - secondary to PDR  5,6. Hypertensive retinopathy OU  - discussed importance of tight BP control.  - monitor  7. Combined form age related cataract OS-   - The symptoms of cataract, surgical options, and treatments and risks were discussed with patient.   - discussed diagnosis and progression  8. Pseudophakia OD  - s/p CE/IOL OD (Dr. Zenia Resides, 12.11.20)  - beautiful surgeries, doing well  Ophthalmic Meds Ordered this visit:  Meds ordered this encounter  Medications   faricimab-svoa (VABYSMO) 84m/0.05mL intravitreal injection     Return in about 4 weeks (around 03/07/2022) for f/u PDR OU, DFE, OCT.  There are no Patient Instructions on file for this visit.  This document serves as a record of services personally performed by BGardiner Sleeper MD, PhD. It was created on their behalf by CRenaldo Reel CBerryan ophthalmic technician. The creation of this record is the provider's dictation and/or activities during the visit.    Electronically signed by:  CRenaldo Reel COT  11.28.23 10:27 PM  This document serves as a record of services personally performed by BGardiner Sleeper MD, PhD. It was  created on their behalf by ASan Jetty BOwens Shark OA an ophthalmic technician. The creation of this record is the provider's dictation and/or activities during the visit.    Electronically signed by: ASan Jetty BOwens Shark ONew York12.12.2023 10:27 PM  BGardiner Sleeper M.D., Ph.D. Diseases & Surgery of the Retina and Vitreous [facility] I have reviewed the above documentation for accuracy and completeness, and I agree with the above. BGardiner Sleeper M.D., Ph.D. 02/07/22 10:29 PM   Abbreviations: M myopia (nearsighted); A astigmatism; H hyperopia (farsighted); P presbyopia; Mrx spectacle prescription;  CTL contact lenses; OD right eye; OS left eye; OU both eyes  XT exotropia; ET esotropia; PEK punctate epithelial keratitis; PEE punctate epithelial erosions; DES dry eye syndrome; MGD meibomian gland dysfunction; ATs artificial tears; PFAT's preservative free artificial tears; NJuno Ridgenuclear sclerotic cataract; PSC posterior subcapsular cataract; ERM epi-retinal membrane; PVD posterior vitreous detachment; RD retinal detachment; DM diabetes mellitus; DR diabetic retinopathy; NPDR non-proliferative diabetic retinopathy; PDR proliferative diabetic retinopathy; CSME clinically significant macular edema; DME diabetic macular edema; dbh dot blot hemorrhages; CWS cotton wool spot; POAG primary open angle glaucoma; C/D cup-to-disc ratio; HVF humphrey visual field; GVF goldmann visual field; OCT optical coherence tomography; IOP intraocular pressure; BRVO Branch retinal vein occlusion; CRVO central retinal vein occlusion; CRAO central retinal artery occlusion; BRAO branch retinal artery occlusion; RT retinal tear; SB scleral buckle; PPV pars plana vitrectomy; VH Vitreous hemorrhage; PRP panretinal laser photocoagulation; IVK intravitreal kenalog; VMT vitreomacular traction; MH Macular hole;  NVD neovascularization of the disc; NVE neovascularization elsewhere; AREDS age related eye disease study; ARMD age  related macular degeneration; POAG primary open angle glaucoma; EBMD epithelial/anterior basement membrane dystrophy; ACIOL anterior chamber intraocular lens; IOL intraocular lens; PCIOL posterior chamber intraocular lens; Phaco/IOL phacoemulsification with intraocular lens placement; PChilchinbitophotorefractive keratectomy; LASIK laser assisted in situ keratomileusis; HTN hypertension; DM diabetes mellitus; COPD chronic obstructive pulmonary disease

## 2022-01-31 DIAGNOSIS — E1169 Type 2 diabetes mellitus with other specified complication: Secondary | ICD-10-CM | POA: Diagnosis not present

## 2022-01-31 LAB — COMPREHENSIVE METABOLIC PANEL
Albumin: 3.6 (ref 3.5–5.0)
Calcium: 9.9 (ref 8.7–10.7)
Globulin: 3.4
eGFR: 92

## 2022-01-31 LAB — LIPID PANEL
Cholesterol: 165 (ref 0–200)
HDL: 48 (ref 35–70)
LDL Cholesterol: 117
LDl/HDL Ratio: 1.8
Triglycerides: 161 — AB (ref 40–160)

## 2022-01-31 LAB — BASIC METABOLIC PANEL
BUN: 21 (ref 4–21)
CO2: 27 — AB (ref 13–22)
Chloride: 106 (ref 99–108)
Creatinine: 0.7 (ref 0.5–1.1)
Glucose: 90
Potassium: 4.3 mEq/L (ref 3.5–5.1)
Sodium: 143 (ref 137–147)

## 2022-01-31 LAB — HEPATIC FUNCTION PANEL
ALT: 28 U/L (ref 7–35)
AST: 54 — AB (ref 13–35)
Alkaline Phosphatase: 98 (ref 25–125)
Bilirubin, Total: 0.2

## 2022-02-04 ENCOUNTER — Other Ambulatory Visit: Payer: Self-pay | Admitting: Family Medicine

## 2022-02-06 DIAGNOSIS — E1165 Type 2 diabetes mellitus with hyperglycemia: Secondary | ICD-10-CM | POA: Diagnosis not present

## 2022-02-06 DIAGNOSIS — E1169 Type 2 diabetes mellitus with other specified complication: Secondary | ICD-10-CM | POA: Diagnosis not present

## 2022-02-06 DIAGNOSIS — R809 Proteinuria, unspecified: Secondary | ICD-10-CM | POA: Diagnosis not present

## 2022-02-06 DIAGNOSIS — I10 Essential (primary) hypertension: Secondary | ICD-10-CM | POA: Diagnosis not present

## 2022-02-06 DIAGNOSIS — E785 Hyperlipidemia, unspecified: Secondary | ICD-10-CM | POA: Diagnosis not present

## 2022-02-06 DIAGNOSIS — Z794 Long term (current) use of insulin: Secondary | ICD-10-CM | POA: Diagnosis not present

## 2022-02-07 ENCOUNTER — Encounter (INDEPENDENT_AMBULATORY_CARE_PROVIDER_SITE_OTHER): Payer: Self-pay | Admitting: Ophthalmology

## 2022-02-07 ENCOUNTER — Ambulatory Visit (INDEPENDENT_AMBULATORY_CARE_PROVIDER_SITE_OTHER): Payer: 59 | Admitting: Ophthalmology

## 2022-02-07 ENCOUNTER — Other Ambulatory Visit: Payer: Self-pay | Admitting: Family Medicine

## 2022-02-07 DIAGNOSIS — H4313 Vitreous hemorrhage, bilateral: Secondary | ICD-10-CM

## 2022-02-07 DIAGNOSIS — Z961 Presence of intraocular lens: Secondary | ICD-10-CM

## 2022-02-07 DIAGNOSIS — H44001 Unspecified purulent endophthalmitis, right eye: Secondary | ICD-10-CM

## 2022-02-07 DIAGNOSIS — I10 Essential (primary) hypertension: Secondary | ICD-10-CM | POA: Diagnosis not present

## 2022-02-07 DIAGNOSIS — H4312 Vitreous hemorrhage, left eye: Secondary | ICD-10-CM

## 2022-02-07 DIAGNOSIS — E113513 Type 2 diabetes mellitus with proliferative diabetic retinopathy with macular edema, bilateral: Secondary | ICD-10-CM

## 2022-02-07 DIAGNOSIS — H25812 Combined forms of age-related cataract, left eye: Secondary | ICD-10-CM

## 2022-02-07 DIAGNOSIS — H35033 Hypertensive retinopathy, bilateral: Secondary | ICD-10-CM | POA: Diagnosis not present

## 2022-02-07 DIAGNOSIS — H4311 Vitreous hemorrhage, right eye: Secondary | ICD-10-CM

## 2022-02-07 MED ORDER — FARICIMAB-SVOA 6 MG/0.05ML IZ SOLN
6.0000 mg | INTRAVITREAL | Status: AC | PRN
  Administered 2022-02-07: 6 mg via INTRAVITREAL

## 2022-02-08 ENCOUNTER — Encounter: Payer: Self-pay | Admitting: Family Medicine

## 2022-02-08 ENCOUNTER — Ambulatory Visit: Payer: 59 | Admitting: Family Medicine

## 2022-02-08 VITALS — BP 109/67 | HR 88 | Temp 97.7°F | Ht 64.25 in | Wt 184.8 lb

## 2022-02-08 DIAGNOSIS — I10 Essential (primary) hypertension: Secondary | ICD-10-CM

## 2022-02-08 DIAGNOSIS — R7401 Elevation of levels of liver transaminase levels: Secondary | ICD-10-CM

## 2022-02-08 DIAGNOSIS — Z862 Personal history of diseases of the blood and blood-forming organs and certain disorders involving the immune mechanism: Secondary | ICD-10-CM

## 2022-02-08 DIAGNOSIS — M797 Fibromyalgia: Secondary | ICD-10-CM | POA: Diagnosis not present

## 2022-02-08 DIAGNOSIS — E782 Mixed hyperlipidemia: Secondary | ICD-10-CM

## 2022-02-08 DIAGNOSIS — Z0000 Encounter for general adult medical examination without abnormal findings: Secondary | ICD-10-CM

## 2022-02-08 DIAGNOSIS — G894 Chronic pain syndrome: Secondary | ICD-10-CM | POA: Diagnosis not present

## 2022-02-08 MED ORDER — GABAPENTIN 300 MG PO CAPS: 300.0000 mg | ORAL_CAPSULE | Freq: Three times a day (TID) | ORAL | 1 refills | Status: DC

## 2022-02-08 MED ORDER — METOPROLOL SUCCINATE ER 100 MG PO TB24: ORAL_TABLET | ORAL | 1 refills | Status: DC

## 2022-02-08 MED ORDER — ROSUVASTATIN CALCIUM 10 MG PO TABS: 10.0000 mg | ORAL_TABLET | Freq: Every day | ORAL | 1 refills | Status: DC

## 2022-02-08 MED ORDER — PANTOPRAZOLE SODIUM 40 MG PO TBEC: DELAYED_RELEASE_TABLET | ORAL | 1 refills | Status: DC

## 2022-02-08 MED ORDER — METOCLOPRAMIDE HCL 5 MG PO TABS: ORAL_TABLET | ORAL | 1 refills | Status: DC

## 2022-02-08 MED ORDER — AMLODIPINE BESYLATE 5 MG PO TABS: 5.0000 mg | ORAL_TABLET | Freq: Every day | ORAL | 1 refills | Status: DC

## 2022-02-08 MED ORDER — FUROSEMIDE 20 MG PO TABS: ORAL_TABLET | ORAL | 1 refills | Status: DC

## 2022-02-08 NOTE — Patient Instructions (Signed)

## 2022-02-08 NOTE — Progress Notes (Signed)
Office Note 02/08/2022  CC:  Chief Complaint  Patient presents with   Annual Exam    Pt is fasting   Patient is a 51 y.o. female who is here for annual health maintenance exam and 40-monthfollow-up hypertension, hyperlipidemia, and fibromyalgia with suspected superimposed polymyalgia rheumatica.  A/P as of last visit: "#1 hypertension, well controlled on amlodipine 5 mg a day, losartan 100 mg a day, Toprol-XL 200 mg a day. Electrolytes and creatinine today.   2.  Mixed hyperlipidemia. Continue to work on low-fat low-carb intake. Last LDL was 66 about 3 months ago. Continue rosuvastatin 10 mg a day.   #3 fibromyalgia, chronic pain. She has superimposed polymyalgia rheumatica. Her medical comorbidities have precluded use of prednisone and other rheumatic agents. Continue gabapentin 300 mg 3 times daily, meloxicam 15 mg daily as needed, and Vicodin 5-325, 1 every 6 as needed.  #40 prescribed today.   #4 type 2 diabetes, multiple complications. She was out of insulin for a while due to being in the donut hole.  She has gotten back on this now and says her glucoses have been well controlled. She is following up with Dr. ANehemiah Settle endocrinology."  INTERIM HX: Overall she is doing well. Home blood pressures normal.  She is having some nausea with occasional vomiting over the last 6 weeks.  This is similar to her past problems with diabetic gastroparesis. No abdominal pain, no fever.  Her pain is relatively well-controlled.  She is trying to be as active as possible.  Her hips are giving her the most problem lately.  PMP AWARE reviewed today: most recent rx for Vicodin 5-3 25 was filled 10/19/2021, # [id number] rx by me. No red flags.  Past Medical History:  Diagnosis Date   Abdominal bloating    likely from diab gastroparesis.  Dr. AHavery Morosstarted trial of reglan 03/2019.   Benign brain tumor ([facility]    Cystic lesion in cerebral aqueduct region with mild hydrocephalus-- stable MRI  02/2016.  Surveillance MRI 05/2017 --dilated cerebral aqueduct related to aqueductal stenosis and subsequent mild hydrocephalus (due to the 11 mm stable cystic lesion in cerebral aqueduct---?congenitial?.   Cataract    OU   Dysmenorrhea    vicodin occ during first 2 days of cycle.   Fibromyalgia    Gluten intolerance    pt reports she underwent full GI w/u to r/o celiac dz   Hepatic steatosis    ultrasound 08/2017. Hx of very mild elevation of ALT.  Stable on u/s 06/2020   History of adenomatous polyp of colon 04/08/2019   recall Feb 2024   Hyperlipidemia, mixed    Hypertension    +white coat component   Hypertensive retinopathy of both eyes    Insomnia    Iron deficiency 01/2019   Hb 11.3. Hemoccults neg x 3 03/19/19. EGD and colonoscopy 04/08/19 showed NO cause for IDA.  Pt does have menorrhagia, though, so she'll see her GYN.  started FeSO4 325 qd approx 04/14/19.   Menorrhagia    resulting in IDA 2021   PMR (polymyalgia rheumatica) ([facility] [place]   question of; hx of elevated ESR-->better with prednisone but prednisone was contraindicated d/t eye issues/hyperglycemia-->rheum started following her 02/2021->plaquenil 04/2021   Proliferative diabetic retinopathy of both eyes ([facility]    steroid injections 10/2017--improved   Sensorineural hearing loss of left ear    Sudden left hearing loss summer 2016--no improvement with steroids 01/2015 so brain MRI done by Dr. BRedmond Basemanand it showed brain tumor that  was determined to be benign.  Pt's hearing not bad enough for hearing aid as of 06/2016.   Type 2 diabetes with complication (HCC)    +microalbuminuria, diab retpthy, diabetic gastroparesis (gastric emptying study mildly abnl 03/2017).  Recommended lantus 08/2018 but pt declined. Mild microalbuminuria.   Uterine fibroid 2022   per pt report, pelvic u/s in GYN office    Past Surgical History:  Procedure Laterality Date   ANOSCOPY  05/12/2019   Procedure: normal exam, minimal hemorrhoid disease.  Hyertrophied anal papila, benign appearing, posterior midline. Surgeon: Leighton Ruff MD   CHOLECYSTECTOMY  2000   COLONOSCOPY  04/08/2019   5 adenomas, recall 3 yrs; no cause for IDA found.  Hypertrophied anal papillae->bx showed low grade dysplasia; GI referred her to colorectal surgeon.   ESOPHAGOGASTRODUODENOSCOPY  04/08/2019   mild chronic reactive gastritis. H pylori NEG.  No cause for IDA found.   GAS INSERTION Right 10/31/2018   Procedure: Insertion Of C3F8 Gas;  Surgeon: Bernarda Caffey, MD;  Location: [facility];  Service: Ophthalmology;  Laterality: Right;   GASTRIC EMPTYING SCAN  04/20/2017   Mildly abnormal, particularly the 1st hour of emptying.   MEMBRANE PEEL Right 10/31/2018   Procedure: MEMBRANE PEEL;  Surgeon: Bernarda Caffey, MD;  Location: [place];  Service: Ophthalmology;  Laterality: Right;   PARS PLANA VITRECTOMY Right 04/12/2018   Procedure: Right PARS PLANA VITRECTOMY WITH 25 GAUGE with intravitreal antibiotics;  Surgeon: Bernarda Caffey, MD;  Location: [place];  Service: Ophthalmology;  Laterality: Right;   PARS PLANA VITRECTOMY Right 10/31/2018   Procedure: PARS PLANA VITRECTOMY WITH 25 GAUGE;  Surgeon: Bernarda Caffey, MD;  Location: [place];  Service: Ophthalmology;  Laterality: Right;   PHOTOCOAGULATION WITH LASER Right 10/31/2018   Procedure: Photocoagulation With Laser;  Surgeon: Bernarda Caffey, MD;  Location: [place];  Service: Ophthalmology;  Laterality: Right;   TRANSTHORACIC ECHOCARDIOGRAM  08/23/2020   Grd I DD, o/w normal.    Family History  Problem Relation Age of Onset   Brain cancer Mother    Diabetes Father    Diabetes Maternal Grandmother    Cataracts Maternal Grandmother    Cervical cancer Paternal Grandmother    Colon cancer Maternal Grandfather 31   Amblyopia Neg Hx    Blindness Neg Hx    Glaucoma Neg Hx    Macular degeneration Neg Hx    Retinal detachment Neg Hx    Strabismus Neg Hx    Retinitis pigmentosa Neg Hx    Esophageal cancer Neg Hx    Stomach  cancer Neg Hx    Rectal cancer Neg Hx     Social History   Socioeconomic History   Marital status: Married    Spouse name: Not on file   Number of children: 0   Years of education: Not on file   Highest education level: Not on file  Occupational History   Occupation: unemployed  Tobacco Use   Smoking status: Never   Smokeless tobacco: Never  Vaping Use   Vaping Use: Never used  Substance and Sexual Activity   Alcohol use: No   Drug use: No   Sexual activity: Yes  Other Topics Concern   Not on file  Social History Narrative   Married, no children..   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       Occup: [employer]   No T/A/Ds.   Social Determinants of Health   Financial Resource Strain: Not on file  Food  Insecurity: Not on file  Transportation Needs: Not on file  Physical Activity: Not on file  Stress: Not on file  Social Connections: Not on file  Intimate Partner Violence: Not on file    Outpatient Medications Prior to Visit  Medication Sig Dispense Refill   Continuous Blood Gluc Sensor (FREESTYLE LIBRE 3 SENSOR) MISC by Does not apply route every 14 (fourteen) days.     FARXIGA 10 MG TABS tablet Take 10 mg by mouth daily.     HYDROcodone-acetaminophen (NORCO/VICODIN) 5-325 MG tablet Take 1-2 tablets by mouth every 6 (six) hours as needed for moderate pain. 40 tablet 0   insulin aspart (NOVOLOG) 100 UNIT/ML injection Inject 10 Units into the skin 2 (two) times daily with a meal. Per endocrinologist     Insulin Glargine (BASAGLAR KWIKPEN) 100 UNIT/ML 10 units every morning and 24 units nightly     losartan (COZAAR) 100 MG tablet Take 1 tablet (100 mg total) by mouth daily. 90 tablet 3   meloxicam (MOBIC) 15 MG tablet 1 tablet as needed     metFORMIN (GLUCOPHAGE) 1000 MG tablet TAKE 1 TABLET BY MOUTH TWICE DAILY WITH MEALS 180 tablet 0   Multiple Vitamin (MULTIVITAMIN WITH MINERALS) TABS tablet Take 1 tablet by mouth daily.      promethazine (PHENERGAN) 12.5 MG tablet TAKE 1 TO 2 TABLETS BY MOUTH EVERY 6 HOURS AS NEEDED FOR NAUSEA 30 tablet 0   verapamil (CALAN-SR) 240 MG CR tablet Take 1 tablet (240 mg total) by mouth at bedtime. 90 tablet 3   VICTOZA 18 MG/3ML SOPN INJECT 1.2MG INTO THE SKIN DAILY (Patient taking differently: 1.8 mg. INJECT 1.2MG INTO THE SKIN DAILY) 18 mL 0   amLODipine (NORVASC) 5 MG tablet Take 1 tablet (5 mg total) by mouth daily. 90 tablet 1   Continuous Blood Gluc Sensor (FREESTYLE LIBRE 2 SENSOR) MISC by Does not apply route. (Patient not taking: Reported on 02/08/2022)     furosemide (LASIX) 20 MG tablet TAKE 2 TABLETS BY MOUTH IN THE MORNING AND 1 IN THE EVENING 180 tablet 1   gabapentin (NEURONTIN) 300 MG capsule TAKE 1 CAPSULE BY MOUTH THREE TIMES DAILY 90 capsule 0   metoCLOPramide (REGLAN) 5 MG tablet TAKE 1 TABLET BY MOUTH 4 TIMES DAILY BEFORE MEAL(S) AND AT BEDTIME 360 tablet 1   metoprolol succinate (TOPROL-XL) 100 MG 24 hr tablet Take 2 tablets by mouth once daily 180 tablet 1   pantoprazole (PROTONIX) 40 MG tablet TAKE 1 TABLET BY MOUTH ONCE DAILY . 90 tablet 1   rosuvastatin (CRESTOR) 10 MG tablet Take 1 tablet (10 mg total) by mouth daily. 90 tablet 1   No facility-administered medications prior to visit.    Allergies  Allergen Reactions   Gluten Meal Swelling   Lisinopril Cough   Pioglitazone Other (See Comments)    ELEVATED glucoses + worse chronic nausea    ROS Review of Systems  Constitutional:  Negative for appetite change, chills, fatigue and fever.  HENT:  Negative for congestion, dental problem, ear pain and sore throat.   Eyes:  Negative for discharge, redness and visual disturbance.  Respiratory:  Negative for cough, chest tightness, shortness of breath and wheezing.   Cardiovascular:  Negative for chest pain, palpitations and leg swelling.  Gastrointestinal:  Positive for nausea. Negative for abdominal pain, blood in stool, diarrhea and vomiting.   Genitourinary:  Negative for difficulty urinating, dysuria, flank pain, frequency, hematuria and urgency.  Musculoskeletal:  Positive for arthralgias. Negative for  back pain, joint swelling, myalgias and neck stiffness.  Skin:  Negative for pallor and rash.  Neurological:  Negative for dizziness, speech difficulty, weakness and headaches.  Hematological:  Negative for adenopathy. Does not bruise/bleed easily.  Psychiatric/Behavioral:  Negative for confusion and sleep disturbance. The patient is not nervous/anxious.     PE;    02/08/2022    1:18 PM 10/19/2021    1:04 PM 07/15/2021    2:27 PM  Vitals with BMI  Height 5' 4.25" _0  _1   Weight 184 lbs 13 oz 183 lbs 6 oz 192 lbs 6 oz  BMI 31.47 71.0 62.69  Systolic 485 462 703  Diastolic 67 76 66  Pulse 88 82 87     Exam chaperoned by Gertie Gowda, phlebotomist. Gen: Alert, well appearing.  Patient is oriented to person, place, time, and situation. AFFECT: pleasant, lucid thought and speech. ENT: Ears: EACs clear, normal epithelium.  TMs with good light reflex and landmarks bilaterally.  Eyes: no injection, icteris, swelling, or exudate.  EOMI, PERRLA. Nose: no drainage or turbinate edema/swelling.  No injection or focal lesion.  Mouth: lips without lesion/swelling.  Oral mucosa pink and moist.  Dentition intact and without obvious caries or gingival swelling.  Oropharynx without erythema, exudate, or swelling.  Neck: supple/nontender.  No LAD, mass, or TM.  Carotid pulses 2+ bilaterally, without bruits. CV: RRR, no m/r/g.   LUNGS: CTA bilat, nonlabored resps, good aeration in all lung fields. ABD: soft, NT, ND, BS normal.  No hepatospenomegaly or mass.  No bruits. EXT: no clubbing, cyanosis, or edema.  Musculoskeletal: no joint swelling, erythema, warmth, or tenderness.  ROM of all joints intact. Skin - no sores or suspicious lesions or rashes or color changes  Pertinent labs:  Lab Results  Component Value Date   TSH 4.24  11/10/2020   Lab Results  Component Value Date   WBC 9.7 12/19/2021   HGB 12.6 12/19/2021   HCT 39 12/19/2021   MCV 83.8 07/12/2020   PLT 495 (A) 12/19/2021   Lab Results  Component Value Date   IRON 44 (L) 07/12/2020   TIBC 447 07/12/2020   FERRITIN 26 07/12/2020   Lab Results  Component Value Date   CREATININE 0.8 12/19/2021   BUN 19 12/19/2021   NA 140 12/19/2021   K 4.4 12/19/2021   CL 99 12/19/2021   CO2 20 12/19/2021   Lab Results  Component Value Date   ALT 64 (A) 12/19/2021   AST 46 (A) 12/19/2021   ALKPHOS 113 12/19/2021   BILITOT 0.2 10/19/2021   Lab Results  Component Value Date   CHOL 138 07/29/2021   Lab Results  Component Value Date   HDL 37 (L) 07/29/2021   Lab Results  Component Value Date   LDLCALC 66 07/29/2021   Lab Results  Component Value Date   TRIG 266 (H) 07/29/2021   Lab Results  Component Value Date   CHOLHDL 3.7 07/29/2021   Lab Results  Component Value Date   HGBA1C 8.6 (H) 11/10/2020   Lab Results  Component Value Date   ESRSEDRATE 77 03/17/2021   ASSESSMENT AND PLAN:   #1 health maintenance exam: Reviewed age and gender appropriate health maintenance issues (prudent diet, regular exercise, health risks of tobacco and excessive alcohol, use of seatbelts, fire alarms in home, use of sunscreen).  Also reviewed age and gender appropriate health screening as well as vaccine recommendations. Vaccines: All up-to-date Labs: CBC, c-Met, TSH, lipid panel. Cervical ca  screening: per GYN MD Breast ca screening: Up-to-date June 2023, [facility]. Colon ca screening: hx multiple polyps, recall 03/2022.  #2 mixed hyperlipidemia, LDL goal less than 70. LDL was 66 about 6 months ago. We cut her rosuvastatin down from 10 mg daily to every other day when she was having lots of musculoskeletal pain.  This did not make any difference in her pain.  If LDL not at goal today then will get her back on daily rosuvastatin.  #3  hypertension, well-controlled on amlodipine 5 mg a day, losartan 100 mg a day, and Toprol-XL 200 mg a day.  #4 chronic pain: Fibromyalgia plus polymyalgia rheumatica. Stable.  Continue as needed meloxicam and as needed Vicodin. Continue gabapentin 300 mg 3 times daily.  #5 DM managed by endo (Dr. Nehemiah Settle) as of 01/13/21. Patient is happy to report that her most recent hemoglobin A1c was 7.2%.  An After Visit Summary was printed and given to the patient.  FOLLOW UP:  Return in about 6 months (around 08/10/2022) for routine chronic illness f/u.  Signed:  Crissie Sickles, MD           02/08/2022

## 2022-02-09 LAB — LIPID PANEL
Cholesterol: 184 mg/dL (ref <200)
HDL: 47 mg/dL — ABNORMAL LOW (ref >=50)
LDL Cholesterol (Calc): 98 mg/dL (calc)
Non-HDL Cholesterol (Calc): 137 mg/dL (calc) — ABNORMAL HIGH (ref <130)
Total CHOL/HDL Ratio: 3.9 (calc) (ref <5.0)
Triglycerides: 276 mg/dL — ABNORMAL HIGH (ref <150)

## 2022-02-09 LAB — COMPREHENSIVE METABOLIC PANEL
AG Ratio: 1.6 (calc) (ref 1.0–2.5)
ALT: 35 U/L — ABNORMAL HIGH (ref 6–29)
AST: 21 U/L (ref 10–35)
Albumin: 4.2 g/dL (ref 3.6–5.1)
Alkaline phosphatase (APISO): 87 U/L (ref 37–153)
BUN: 16 mg/dL (ref 7–25)
CO2: 26 mmol/L (ref 20–32)
Calcium: 9.7 mg/dL (ref 8.6–10.4)
Chloride: 108 mmol/L (ref 98–110)
Creat: 0.86 mg/dL (ref 0.50–1.03)
Globulin: 2.6 g/dL (calc) (ref 1.9–3.7)
Glucose, Bld: 130 mg/dL — ABNORMAL HIGH (ref 65–99)
Potassium: 4.8 mmol/L (ref 3.5–5.3)
Sodium: 145 mmol/L (ref 135–146)
Total Bilirubin: 0.2 mg/dL (ref 0.2–1.2)
Total Protein: 6.8 g/dL (ref 6.1–8.1)

## 2022-02-09 LAB — CBC
HCT: 35.9 % (ref 35.0–45.0)
Hemoglobin: 12 g/dL (ref 11.7–15.5)
MCH: 27.6 pg (ref 27.0–33.0)
MCHC: 33.4 g/dL (ref 32.0–36.0)
MCV: 82.5 fL (ref 80.0–100.0)
MPV: 10.3 fL (ref 7.5–12.5)
Platelets: 410 10*3/uL — ABNORMAL HIGH (ref 140–400)
RBC: 4.35 10*6/uL (ref 3.80–5.10)
RDW: 14.9 % (ref 11.0–15.0)
WBC: 10.3 10*3/uL (ref 3.8–10.8)

## 2022-02-09 LAB — IRON,TIBC AND FERRITIN PANEL
%SAT: 7 % (calc) — ABNORMAL LOW (ref 16–45)
Ferritin: 11 ng/mL — ABNORMAL LOW (ref 16–232)
Iron: 31 ug/dL — ABNORMAL LOW (ref 45–160)
TIBC: 426 mcg/dL (calc) (ref 250–450)

## 2022-02-09 LAB — TSH: TSH: 1.68 mIU/L

## 2022-02-10 ENCOUNTER — Telehealth: Payer: Self-pay | Admitting: Family Medicine

## 2022-02-10 NOTE — Telephone Encounter (Signed)
Pt was good with lab results and recommendations. No additional questions or concerns.

## 2022-02-13 ENCOUNTER — Other Ambulatory Visit: Payer: Self-pay | Admitting: Family Medicine

## 2022-02-13 NOTE — Telephone Encounter (Signed)
Noted  

## 2022-02-23 NOTE — Progress Notes (Signed)
[facility] Note  03/07/2022     CHIEF COMPLAINT Patient presents for Retina Follow Up  HISTORY OF PRESENT ILLNESS: Gloria Gomez is a 51 y.o. female who presents to the clinic today for:  HPI     Retina Follow Up   Patient presents with  Diabetic Retinopathy.  In both eyes.  Severity is moderate.  Duration of 4 weeks.  Since onset it is stable.  I, the attending physician,  performed the HPI with the patient and updated documentation appropriately.        Comments   Pt here for 4 ret f/u PDR OU. Pt states VA the same, no changes. Recent A1C was 7.2 in Dec.       Last edited by Bernarda Caffey, MD on 03/07/2022  4:31 PM.      Pt states vision is stable  Referring physician:   HISTORICAL INFORMATION:   Selected notes from the MEDICAL RECORD NUMBER Referred for DM exam   CURRENT MEDICATIONS: No current outpatient medications on file. (Ophthalmic Drugs)   No current facility-administered medications for this visit. (Ophthalmic Drugs)   Current Outpatient Medications (Other)  Medication Sig   amLODipine (NORVASC) 5 MG tablet Take 1 tablet (5 mg total) by mouth daily.   Continuous Blood Gluc Sensor (FREESTYLE LIBRE 3 SENSOR) MISC by Does not apply route every 14 (fourteen) days.   FARXIGA 10 MG TABS tablet Take 10 mg by mouth daily.   furosemide (LASIX) 20 MG tablet TAKE 2 TABLETS BY MOUTH IN THE MORNING AND 1 IN THE EVENING   gabapentin (NEURONTIN) 300 MG capsule Take 1 capsule (300 mg total) by mouth 3 (three) times daily.   HYDROcodone-acetaminophen (NORCO/VICODIN) 5-325 MG tablet Take 1-2 tablets by mouth every 6 (six) hours as needed for moderate pain.   insulin aspart (NOVOLOG) 100 UNIT/ML injection Inject 10 Units into the skin 2 (two) times daily with a meal. Per endocrinologist   Insulin Glargine (BASAGLAR KWIKPEN) 100 UNIT/ML 10 units every morning and 24 units nightly   losartan (COZAAR) 100 MG tablet Take 1 tablet (100 mg total) by  mouth daily.   meloxicam (MOBIC) 15 MG tablet 1 tablet as needed   metFORMIN (GLUCOPHAGE) 1000 MG tablet TAKE 1 TABLET BY MOUTH TWICE DAILY WITH MEALS   metoCLOPramide (REGLAN) 5 MG tablet TAKE 1 TABLET BY MOUTH 4 TIMES DAILY BEFORE MEAL(S) AND AT BEDTIME   metoprolol succinate (TOPROL-XL) 100 MG 24 hr tablet Take 2 tablets by mouth once daily   Multiple Vitamin (MULTIVITAMIN WITH MINERALS) TABS tablet Take 1 tablet by mouth daily.   pantoprazole (PROTONIX) 40 MG tablet TAKE 1 TABLET BY MOUTH ONCE DAILY .   promethazine (PHENERGAN) 12.5 MG tablet TAKE 1 TO 2 TABLETS BY MOUTH EVERY 6 HOURS AS NEEDED FOR NAUSEA   rosuvastatin (CRESTOR) 10 MG tablet Take 1 tablet (10 mg total) by mouth daily.   verapamil (CALAN-SR) 240 MG CR tablet Take 1 tablet (240 mg total) by mouth at bedtime.   VICTOZA 18 MG/3ML SOPN INJECT 1.2MG INTO THE SKIN DAILY (Patient taking differently: 1.8 mg. INJECT 1.2MG INTO THE SKIN DAILY)   No current facility-administered medications for this visit. (Other)   REVIEW OF SYSTEMS: ROS   Positive for: Endocrine, Eyes Negative for: Constitutional, Gastrointestinal, Neurological, Skin, Genitourinary, Musculoskeletal, HENT, Cardiovascular, Respiratory, Psychiatric, Allergic/Imm, Heme/Lymph Last edited by Kingsley Spittle, COT on 03/07/2022  1:52 PM.      ALLERGIES Allergies  Allergen Reactions  Gluten Meal Swelling   Lisinopril Cough   Pioglitazone Other (See Comments)    ELEVATED glucoses + worse chronic nausea   PAST MEDICAL HISTORY Past Medical History:  Diagnosis Date   Abdominal bloating    likely from diab gastroparesis.  Dr. Havery Moros started trial of reglan 03/2019.   Benign brain tumor ([place])    Cystic lesion in cerebral aqueduct region with mild hydrocephalus-- stable MRI 02/2016.  Surveillance MRI 05/2017 --dilated cerebral aqueduct related to aqueductal stenosis and subsequent mild hydrocephalus (due to the 11 mm stable cystic lesion in cerebral  aqueduct---?congenitial?.   Cataract    OU   Dysmenorrhea    vicodin occ during first 2 days of cycle.   Fibromyalgia    Gluten intolerance    pt reports she underwent full GI w/u to r/o celiac dz   Hepatic steatosis    ultrasound 08/2017. Hx of very mild elevation of ALT.  Stable on u/s 06/2020   History of adenomatous polyp of colon 04/08/2019   recall Feb 2024   Hyperlipidemia, mixed    Hypertension    +white coat component   Hypertensive retinopathy of both eyes    Insomnia    Iron deficiency 01/2019   Hb 11.3. Hemoccults neg x 3 03/19/19. EGD and colonoscopy 04/08/19 showed NO cause for IDA.  Pt does have menorrhagia, though, so she'll see her GYN.  started FeSO4 325 qd approx 04/14/19.   Menorrhagia    resulting in IDA 2021   PMR (polymyalgia rheumatica) ([place]) 2022   question of; hx of elevated ESR-->better with prednisone but prednisone was contraindicated d/t eye issues/hyperglycemia-->rheum started following her 02/2021->plaquenil 04/2021   Proliferative diabetic retinopathy of both eyes ([place])    steroid injections 10/2017--improved   Sensorineural hearing loss of left ear    Sudden left hearing loss summer 2016--no improvement with steroids 01/2015 so brain MRI done by Dr. Redmond Baseman and it showed brain tumor that was determined to be benign.  Pt's hearing not bad enough for hearing aid as of 06/2016.   Type 2 diabetes with complication (HCC)    +microalbuminuria, diab retpthy, diabetic gastroparesis (gastric emptying study mildly abnl 03/2017).  Recommended lantus 08/2018 but pt declined. Mild microalbuminuria.   Uterine fibroid 2022   per pt report, pelvic u/s in GYN office   Past Surgical History:  Procedure Laterality Date   ANOSCOPY  05/12/2019   Procedure: normal exam, minimal hemorrhoid disease. Hyertrophied anal papila, benign appearing, posterior midline. Surgeon: Leighton Ruff MD   CHOLECYSTECTOMY  2000   COLONOSCOPY  04/08/2019   5 adenomas, recall 3 yrs; no cause for IDA  found.  Hypertrophied anal papillae->bx showed low grade dysplasia; GI referred her to colorectal surgeon.   ESOPHAGOGASTRODUODENOSCOPY  04/08/2019   mild chronic reactive gastritis. H pylori NEG.  No cause for IDA found.   GAS INSERTION Right 10/31/2018   Procedure: Insertion Of C3F8 Gas;  Surgeon: Bernarda Caffey, MD;  Location: [place];  Service: Ophthalmology;  Laterality: Right;   GASTRIC EMPTYING SCAN  04/20/2017   Mildly abnormal, particularly the 1st hour of emptying.   MEMBRANE PEEL Right 10/31/2018   Procedure: MEMBRANE PEEL;  Surgeon: Bernarda Caffey, MD;  Location: [place];  Service: Ophthalmology;  Laterality: Right;   PARS PLANA VITRECTOMY Right 04/12/2018   Procedure: Right PARS PLANA VITRECTOMY WITH 25 GAUGE with intravitreal antibiotics;  Surgeon: Bernarda Caffey, MD;  Location: [place];  Service: Ophthalmology;  Laterality: Right;   PARS PLANA VITRECTOMY Right 10/31/2018  Procedure: PARS PLANA VITRECTOMY WITH 25 GAUGE;  Surgeon: Bernarda Caffey, MD;  Location: [place];  Service: Ophthalmology;  Laterality: Right;   PHOTOCOAGULATION WITH LASER Right 10/31/2018   Procedure: Photocoagulation With Laser;  Surgeon: Bernarda Caffey, MD;  Location: [place];  Service: Ophthalmology;  Laterality: Right;   TRANSTHORACIC ECHOCARDIOGRAM  08/23/2020   Grd I DD, o/w normal.   FAMILY HISTORY Family History  Problem Relation Age of Onset   Brain cancer Mother    Diabetes Father    Diabetes Maternal Grandmother    Cataracts Maternal Grandmother    Cervical cancer Paternal Grandmother    Colon cancer Maternal Grandfather 68   Amblyopia Neg Hx    Blindness Neg Hx    Glaucoma Neg Hx    Macular degeneration Neg Hx    Retinal detachment Neg Hx    Strabismus Neg Hx    Retinitis pigmentosa Neg Hx    Esophageal cancer Neg Hx    Stomach cancer Neg Hx    Rectal cancer Neg Hx    SOCIAL HISTORY Social History   Tobacco Use   Smoking status: Never   Smokeless tobacco: Never  Vaping Use   Vaping Use:  Never used  Substance Use Topics   Alcohol use: No   Drug use: No       OPHTHALMIC EXAM:  Base Eye Exam     Visual Acuity (Snellen - Linear)       Right Left   Dist [place] 20/25 -2    Dist cc  20/30 +1   Dist ph cc  20/25 -2    Correction: Glasses         Tonometry (Tonopen, 1:57 PM)       Right Left   Pressure 15 14         Pupils       Pupils Dark Light Shape React APD   Right PERRL 3 2 Round Brisk None   Left PERRL 3 2 Round Brisk None         Visual Fields (Counting fingers)       Left Right    Full Full         Extraocular Movement       Right Left    Full, Ortho Full, Ortho         Neuro/Psych     Oriented x3: Yes   Mood/Affect: Normal         Dilation     Both eyes: 1.0% Mydriacyl, 2.5% Phenylephrine @ 1:58 PM           Slit Lamp and Fundus Exam     Slit Lamp Exam       Right Left   Lids/Lashes Dermatochalasis - upper lid, mild Meibomian gland dysfunction, Telangiectasia Dermatochalasis - upper lid, Meibomian gland dysfunction, Telangiectasia   Conjunctiva/Sclera White and quiet White and quiet   Cornea Clear, well healed temporal cataract wounds Trace Punctate epithelial erosions, trace EBMD   Anterior Chamber Deep and quiet Deep and quiet   Iris Round and dilated, No NVI Round and dilated, No NVI   Lens PC IOL in good position, 1-2+ Posterior capsular opacification, PC folds 2-3+ Nuclear sclerosis with mild brunescence, 2-3+ Cortical cataract, 1+ Posterior subcapsular cataract   Anterior Vitreous post vitrectomy, trace pigment Vitreous syneresis, mild vitreous condensations, no heme         Fundus Exam       Right Left   Disc mild Pallor, Sharp rim, temporal PPA  Pink and sharp, Compact, PPA   C/D Ratio 0.2 0.0   Macula Flat, blunted foveal reflex, mild cystic changes -- slightly improved, focal MA and exudates ST mac, +focal laser changes Flat, good foveal reflex, trace cystic changes -- stably improved, scattered MA;  trace ERM, light focal laser changes, no edema   Vessels attenuated, Tortuous attenuated, Tortuous   Periphery Attached, rare MA, scattered DBH greatest posteriorly, 360 PRP, good laser fill in 360 attached, scattered IRH; 360 PRP scars -- with good fill in changes           Refraction     Wearing Rx       Sphere Cylinder   Right -3.75 Sphere   Left -3.75 Sphere           IMAGING AND PROCEDURES  Imaging and Procedures for   OCT, Retina - OU - Both Eyes       Right Eye Quality was good. Central Foveal Thickness: 324. Progression has improved. Findings include no SRF, abnormal foveal contour, intraretinal hyper-reflective material, epiretinal membrane, intraretinal fluid (interval improvement in IRF/cystic changes temporal macula and fovea ).   Left Eye Quality was good. Central Foveal Thickness: 280. Progression has been stable. Findings include normal foveal contour, no IRF, no SRF, intraretinal hyper-reflective material (Stable improvement in IRF/cystic changes temporal fovea, stable improvement in vitreous opacities).   Notes *Images captured and stored on drive  Diagnosis / Impression:  DME OU OD: Mild interval improvement in IRF/cystic changes temporal macula and fovea  OS: stable improvement in IRF/cystic changes temporal fovea, stable improvement in vitreous opacities  Clinical management:  See below  Abbreviations: NFP - Normal foveal profile. CME - cystoid macular edema. PED - pigment epithelial detachment. IRF - intraretinal fluid. SRF - subretinal fluid. EZ - ellipsoid zone. ERM - epiretinal membrane. ORA - outer retinal atrophy. ORT - outer retinal tubulation. SRHM - subretinal hyper-reflective material       Intravitreal Injection, Pharmacologic Agent - OD - Right Eye       Time Out 03/07/2022. 2:34 PM. Confirmed correct patient, procedure, site, and patient consented.   Anesthesia Topical anesthesia was used. Anesthetic medications included  Lidocaine 2%, Proparacaine 0.5%.   Procedure Preparation included 5% betadine to ocular surface, eyelid speculum. A (32g) needle was used.   Injection: 6 mg faricimab-svoa 6 MG/0.05ML   Route: Intravitreal, Site: Right Eye   NDC: [id number], Lot: [id number], Expiration date: 08/27/2023, Waste: 0 mL   Post-op Post injection exam found visual acuity of at least counting fingers. The patient tolerated the procedure well. There were no complications. The patient received written and verbal post procedure care education. Post injection medications were not given.   Notes **SAMPLE MEDICATION ADMINISTERED**           ASSESSMENT/PLAN:   ICD-10-CM   1. Proliferative diabetic retinopathy of both eyes with macular edema associated with type 2 diabetes mellitus (HCC)  E11.3513 OCT, Retina - OU - Both Eyes    Intravitreal Injection, Pharmacologic Agent - OD - Right Eye    faricimab-svoa (VABYSMO) 73m/0.05mL intravitreal injection    2. Vitreous hemorrhage of left eye (HCC)  H43.12     3. Right endophthalmia  H44.001     4. Vitreous hemorrhage, right eye ([place]  H43.11     5. Essential hypertension  I10     6. Hypertensive retinopathy of both eyes  H35.033     7. Combined forms of age-related cataract of left eye  H25.812  8. Pseudophakia of right eye  Z96.1       1.  Proliferative diabetic retinopathy w/ DME, OU  - HbA1c 7.1% (3.23.23), 8.6% (9.14.22), 7.8% (06.13.22), 9.1% (02.24.22) 11.0% (11.22.21) - s/p IVA OD #1 9.20.19, #2 (10.25.19), #3 (11.15.19), #4 (12.17.19), #5 (01.14.20), #6 (2.11.20), #7 (05.29.20), #8 (08.19.20), #9 (10.30.20), #10 (12.09.20), #11 (01.11.21), #12 (02.15.21), #13 (03.23.21), #14 (04.20.21), #15 (06.08.21), #16 (07.06.21) -- IVA resistance - s/p IVA OS #1 9.27.19, #2 (10.25.19), #3 (11.15.19), #4 (12.17.19), #5 (01.14.20), #6 (2.11.20), #7 (04.26.20), #8 (05.29.20), #9 (06.26.20), #10 (08.05.20), #11 (11.11.20), #12 (12.09.20), #13 (01.11.21), #14  (02.15.21), #15 (03.23.21), #16 (04.20.21), #17 (06.08.21) -- IVA resistance, #18 (09.27.21) - s/p IVE OD #1 (08.03.21) -- sample, #2 (09.10.21), #3 (10.08.21), #4 (11.23.21), #5 (12.21.21), #6 (01.19.22) sample, #7 (2.16.22), #8 (3.22.22), #9 (04.19.22), #10 (05.27.22), #11 (06.29.22), #12 (08.03.22), #13 (09.07.22), #14 (10.10.22), #15 (11.14.22), #16 (12.12.22), #17 (01.10.23) - sample, #18 (02.07.23), #19 (03.21.23), #20 (04.18.23), #21 (05.16.23) -- IVE resistance  - s/p IVE OS #1 (10.25.21), #2 (11.23.21), #3 (12.21.21), #4 (3.22.22), #5 (06.29.22), #6 (08.03.22), #7 (09.07.22), #8 (10.10.22), #9 (11.14.22), #10 (02.07.23), #11 (05.16.23), #12 (08.15.23), #13 (11.14.23)  - IVV OD #1 (06.16.23 -- sample), #2 (07.17.23), #3 (08.15.23), #4 (09.12.23), #5 (10.10.23), #6 (11.14.23), #7 (12.12.23)  - S/P PRP OS (09.20.19), (5.19.20), (08.19.20), (04.28.21), (02.02.22)  - S/P PRP OD (9.27.19 and 11.21.19), fill-in (04.14.20) (09.03.20, surgery)  - S/P focal laser OS (07.06.21), OD (09.20.22)  - FA (9.20.19) shows +NVE OU and leaking MA and capillary nonperfusion  - repeat FA 11.15.19 shows NV regressing OU  - pre-op: OD w/ VA stable at 20/25, but there is some preretinal fibrosis / tractional membranes just superior to disc and mild central DME  - s/p 25g PPV+MP+10% C3F8 gas OD (09.03.20) -- ERM/PRF removal OD  - BCVA OD: 20/25 -- improved, OS: 20/25 -- stable             - fibrosis/ERM stably improved; retina attached  - OCT shows OD: Mild interval improvement in IRF/cystic changes temporal macula and fovea ; OS: stable improvement in IRF/cystic changes temporal fovea, stable improvement in vitreous opacities  - recommend IVV OD #8 (SAMPLE) today 1.09.23 -- will hold off on OS today  - new health insurance for 2024 -- authorizations for Vabysmo and Eylea pending  - **OS on ~q80mmaintenance treatment schedule**  - pt in agreement  - RBA of procedure discussed, questions answered - see procedure  note  - Eylea informed consent form re-signed and scanned on 05.16.2023  - Vabysmo informed consent form signed on 06.16.23  - f/u 4 weeks, DFE, OCT, possible injection  2. Vitreous hemorrhage OS -- stably improved  - recurrent VH, onset 09.22.21  - etiology: secondary to PDR as described above (no RT/RD on exam)  - s/p PRP OS (9.20.19), (05.19.20), (08.19.20), (04.28.21), (02.02.22)  - s/p IVA OS on 4.26.20, 5.29.20, 6.26.20, 8.5.20,11.11.20, 12.06.20 and so on as above   - VH clear centrally and settled inferiorly -- now white  - BCVA stable at 20/25  - f/u 4 weeks DFE, OCT  3. History of Endophthalmitis OD  - s/p IVA OU 04/09/2018  - s/p 25g PPV w/ intravitreal vanc, ceftaz and cefepime OD, 2.14.2020  - s/p intravitreal tap / vanc and ceflaz injections (02.16.20)             - gram stain (2.14.20) shows G+ cocci, WBCs mostly PMNs;   - repeat gram stain from  t/i (2.16.20) -- no organisms, just WBCs             - cultures from vitreous grew rare Staph warneri; cultures from t/i -- no growth             - doing well, BCVA 20/30             - inflammation/posterior debris resolved             - IOP 15 off Brimonidine  - completed po pred taper -- caused significant elevations in BG  - monitor  4. History of Vitreous Hemorrhage OD -- cleared from PPV x2 for endophthalmitis and ERM/preretinal fibrosis   - secondary to PDR  5,6. Hypertensive retinopathy OU  - discussed importance of tight BP control.  - monitor  7. Combined form age related cataract OS-   - The symptoms of cataract, surgical options, and treatments and risks were discussed with patient.   - discussed diagnosis and progression  8. Pseudophakia OD  - s/p CE/IOL OD (Dr. Zenia Resides, 12.11.20)  - beautiful surgeries, doing well  Ophthalmic Meds Ordered this visit:  Meds ordered this encounter  Medications   faricimab-svoa (VABYSMO) 31m/0.05mL intravitreal injection     Return in about 4 weeks (around 04/04/2022) for  f/u PDR OU, DFE, OCT.  There are no Patient Instructions on file for this visit.  This document serves as a record of services personally performed by BGardiner Sleeper MD, PhD. It was created on their behalf by CRenaldo Reel CEverettan ophthalmic technician. The creation of this record is the provider's dictation and/or activities during the visit.    Electronically signed by:  CRenaldo Reel COT  12.28.23 4:33 PM  This document serves as a record of services personally performed by BGardiner Sleeper MD, PhD. It was created on their behalf by ASan Jetty BOwens Shark OA an ophthalmic technician. The creation of this record is the provider's dictation and/or activities during the visit.    Electronically signed by: ASan Jetty BMarguerita Merles01.09.2024 4:33 PM   BGardiner Sleeper M.D., Ph.D. Diseases & Surgery of the Retina and Vitreous [facility] I have reviewed the above documentation for accuracy and completeness, and I agree with the above. BGardiner Sleeper M.D., Ph.D. 03/07/22 4:35 PM   Abbreviations: M myopia (nearsighted); A astigmatism; H hyperopia (farsighted); P presbyopia; Mrx spectacle prescription;  CTL contact lenses; OD right eye; OS left eye; OU both eyes  XT exotropia; ET esotropia; PEK punctate epithelial keratitis; PEE punctate epithelial erosions; DES dry eye syndrome; MGD meibomian gland dysfunction; ATs artificial tears; PFAT's preservative free artificial tears; NLindennuclear sclerotic cataract; PSC posterior subcapsular cataract; ERM epi-retinal membrane; PVD posterior vitreous detachment; RD retinal detachment; DM diabetes mellitus; DR diabetic retinopathy; NPDR non-proliferative diabetic retinopathy; PDR proliferative diabetic retinopathy; CSME clinically significant macular edema; DME diabetic macular edema; dbh dot blot hemorrhages; CWS cotton wool spot; POAG primary open angle glaucoma; C/D cup-to-disc ratio; HVF humphrey visual field; GVF goldmann visual  field; OCT optical coherence tomography; IOP intraocular pressure; BRVO Branch retinal vein occlusion; CRVO central retinal vein occlusion; CRAO central retinal artery occlusion; BRAO branch retinal artery occlusion; RT retinal tear; SB scleral buckle; PPV pars plana vitrectomy; VH Vitreous hemorrhage; PRP panretinal laser photocoagulation; IVK intravitreal kenalog; VMT vitreomacular traction; MH Macular hole;  NVD neovascularization of the disc; NVE neovascularization elsewhere; AREDS age related eye disease study; ARMD age related macular degeneration; POAG primary open angle  glaucoma; EBMD epithelial/anterior basement membrane dystrophy; ACIOL anterior chamber intraocular lens; IOL intraocular lens; PCIOL posterior chamber intraocular lens; Phaco/IOL phacoemulsification with intraocular lens placement; [place] photorefractive keratectomy; LASIK laser assisted in situ keratomileusis; HTN hypertension; DM diabetes mellitus; COPD chronic obstructive pulmonary disease

## 2022-03-07 ENCOUNTER — Encounter (INDEPENDENT_AMBULATORY_CARE_PROVIDER_SITE_OTHER): Payer: Self-pay | Admitting: Ophthalmology

## 2022-03-07 ENCOUNTER — Ambulatory Visit (INDEPENDENT_AMBULATORY_CARE_PROVIDER_SITE_OTHER): Payer: Commercial Managed Care - HMO | Admitting: Ophthalmology

## 2022-03-07 DIAGNOSIS — H4311 Vitreous hemorrhage, right eye: Secondary | ICD-10-CM

## 2022-03-07 DIAGNOSIS — Z961 Presence of intraocular lens: Secondary | ICD-10-CM

## 2022-03-07 DIAGNOSIS — H4312 Vitreous hemorrhage, left eye: Secondary | ICD-10-CM

## 2022-03-07 DIAGNOSIS — E113513 Type 2 diabetes mellitus with proliferative diabetic retinopathy with macular edema, bilateral: Secondary | ICD-10-CM

## 2022-03-07 DIAGNOSIS — H35033 Hypertensive retinopathy, bilateral: Secondary | ICD-10-CM

## 2022-03-07 DIAGNOSIS — H25812 Combined forms of age-related cataract, left eye: Secondary | ICD-10-CM

## 2022-03-07 DIAGNOSIS — I10 Essential (primary) hypertension: Secondary | ICD-10-CM

## 2022-03-07 DIAGNOSIS — H44001 Unspecified purulent endophthalmitis, right eye: Secondary | ICD-10-CM

## 2022-03-07 MED ORDER — FARICIMAB-SVOA 6 MG/0.05ML IZ SOLN
6.0000 mg | INTRAVITREAL | Status: AC | PRN
  Administered 2022-03-07: 6 mg via INTRAVITREAL

## 2022-03-21 NOTE — Progress Notes (Signed)
[facility] Note  04/04/2022     CHIEF COMPLAINT Patient presents for Retina Follow Up  HISTORY OF PRESENT ILLNESS: Gloria Gomez is a 52 y.o. female who presents to the clinic today for:  HPI     Retina Follow Up   In both eyes.  This started 4 weeks ago.  Duration of 4 weeks.  Since onset it is stable.  I, the attending physician,  performed the HPI with the patient and updated documentation appropriately.        Comments   4 week retina follow up PDR OU and IVV OD pt is reporting no vision changes noticed she       Last edited by Bernarda Caffey, MD on 04/04/2022  4:47 PM.    Pt states vision is stable  Referring physician:   HISTORICAL INFORMATION:   Selected notes from the MEDICAL RECORD NUMBER Referred for DM exam   CURRENT MEDICATIONS: No current outpatient medications on file. (Ophthalmic Drugs)   No current facility-administered medications for this visit. (Ophthalmic Drugs)   Current Outpatient Medications (Other)  Medication Sig   amLODipine (NORVASC) 5 MG tablet Take 1 tablet (5 mg total) by mouth daily.   Continuous Blood Gluc Sensor (FREESTYLE LIBRE 3 SENSOR) MISC by Does not apply route every 14 (fourteen) days.   FARXIGA 10 MG TABS tablet Take 10 mg by mouth daily.   furosemide (LASIX) 20 MG tablet TAKE 2 TABLETS BY MOUTH IN THE MORNING AND 1 IN THE EVENING   gabapentin (NEURONTIN) 300 MG capsule Take 1 capsule (300 mg total) by mouth 3 (three) times daily.   HYDROcodone-acetaminophen (NORCO/VICODIN) 5-325 MG tablet Take 1-2 tablets by mouth every 6 (six) hours as needed for moderate pain.   insulin aspart (NOVOLOG) 100 UNIT/ML injection Inject 10 Units into the skin 2 (two) times daily with a meal. Per endocrinologist   Insulin Glargine (BASAGLAR KWIKPEN) 100 UNIT/ML 10 units every morning and 24 units nightly   losartan (COZAAR) 100 MG tablet Take 1 tablet (100 mg total) by mouth daily.   meloxicam (MOBIC) 15 MG tablet 1  tablet as needed   metFORMIN (GLUCOPHAGE) 1000 MG tablet TAKE 1 TABLET BY MOUTH TWICE DAILY WITH MEALS   metoCLOPramide (REGLAN) 5 MG tablet TAKE 1 TABLET BY MOUTH 4 TIMES DAILY BEFORE MEAL(S) AND AT BEDTIME   metoprolol succinate (TOPROL-XL) 100 MG 24 hr tablet Take 2 tablets by mouth once daily   Multiple Vitamin (MULTIVITAMIN WITH MINERALS) TABS tablet Take 1 tablet by mouth daily.   pantoprazole (PROTONIX) 40 MG tablet TAKE 1 TABLET BY MOUTH ONCE DAILY .   promethazine (PHENERGAN) 12.5 MG tablet TAKE 1 TO 2 TABLETS BY MOUTH EVERY 6 HOURS AS NEEDED FOR NAUSEA   rosuvastatin (CRESTOR) 10 MG tablet Take 1 tablet (10 mg total) by mouth daily.   verapamil (CALAN-SR) 240 MG CR tablet Take 1 tablet (240 mg total) by mouth at bedtime.   VICTOZA 18 MG/3ML SOPN INJECT 1.'2MG'$  INTO THE SKIN DAILY (Patient taking differently: 1.8 mg. INJECT 1.'2MG'$  INTO THE SKIN DAILY)   No current facility-administered medications for this visit. (Other)   REVIEW OF SYSTEMS: ROS   Positive for: Endocrine, Eyes Negative for: Constitutional, Gastrointestinal, Neurological, Skin, Genitourinary, Musculoskeletal, HENT, Cardiovascular, Respiratory, Psychiatric, Allergic/Imm, Heme/Lymph Last edited by Parthenia Ames, COT on 04/04/2022  1:51 PM.     ALLERGIES Allergies  Allergen Reactions   Gluten Meal Swelling   Lisinopril Cough   Pioglitazone  Other (See Comments)    ELEVATED glucoses + worse chronic nausea   PAST MEDICAL HISTORY Past Medical History:  Diagnosis Date   Abdominal bloating    likely from diab gastroparesis.  Dr. Havery Moros started trial of reglan 03/2019.   Benign brain tumor ([place])    Cystic lesion in cerebral aqueduct region with mild hydrocephalus-- stable MRI 02/2016.  Surveillance MRI 05/2017 --dilated cerebral aqueduct related to aqueductal stenosis and subsequent mild hydrocephalus (due to the 11 mm stable cystic lesion in cerebral aqueduct---?congenitial?.   Cataract    OU   Dysmenorrhea     vicodin occ during first 2 days of cycle.   Fibromyalgia    Gluten intolerance    pt reports she underwent full GI w/u to r/o celiac dz   Hepatic steatosis    ultrasound 08/2017. Hx of very mild elevation of ALT.  Stable on u/s 06/2020   History of adenomatous polyp of colon 04/08/2019   recall Feb 2024   Hyperlipidemia, mixed    Hypertension    +white coat component   Hypertensive retinopathy of both eyes    Insomnia    Iron deficiency 01/2019   Hb 11.3. Hemoccults neg x 3 03/19/19. EGD and colonoscopy 04/08/19 showed NO cause for IDA.  Pt does have menorrhagia, though, so she'll see her GYN.  started FeSO4 325 qd approx 04/14/19.   Menorrhagia    resulting in IDA 2021   PMR (polymyalgia rheumatica) ([place]) 2022   question of; hx of elevated ESR-->better with prednisone but prednisone was contraindicated d/t eye issues/hyperglycemia-->rheum started following her 02/2021->plaquenil 04/2021   Proliferative diabetic retinopathy of both eyes ([place])    steroid injections 10/2017--improved   Sensorineural hearing loss of left ear    Sudden left hearing loss summer 2016--no improvement with steroids 01/2015 so brain MRI done by Dr. Redmond Baseman and it showed brain tumor that was determined to be benign.  Pt's hearing not bad enough for hearing aid as of 06/2016.   Type 2 diabetes with complication (HCC)    +microalbuminuria, diab retpthy, diabetic gastroparesis (gastric emptying study mildly abnl 03/2017).  Recommended lantus 08/2018 but pt declined. Mild microalbuminuria.   Uterine fibroid 2022   per pt report, pelvic u/s in GYN office   Past Surgical History:  Procedure Laterality Date   ANOSCOPY  05/12/2019   Procedure: normal exam, minimal hemorrhoid disease. Hyertrophied anal papila, benign appearing, posterior midline. Surgeon: Leighton Ruff MD   CHOLECYSTECTOMY  2000   COLONOSCOPY  04/08/2019   5 adenomas, recall 3 yrs; no cause for IDA found.  Hypertrophied anal papillae->bx showed low grade  dysplasia; GI referred her to colorectal surgeon.   ESOPHAGOGASTRODUODENOSCOPY  04/08/2019   mild chronic reactive gastritis. H pylori NEG.  No cause for IDA found.   GAS INSERTION Right 10/31/2018   Procedure: Insertion Of C3F8 Gas;  Surgeon: Bernarda Caffey, MD;  Location: [place];  Service: Ophthalmology;  Laterality: Right;   GASTRIC EMPTYING SCAN  04/20/2017   Mildly abnormal, particularly the 1st hour of emptying.   MEMBRANE PEEL Right 10/31/2018   Procedure: MEMBRANE PEEL;  Surgeon: Bernarda Caffey, MD;  Location: [place];  Service: Ophthalmology;  Laterality: Right;   PARS PLANA VITRECTOMY Right 04/12/2018   Procedure: Right PARS PLANA VITRECTOMY WITH 25 GAUGE with intravitreal antibiotics;  Surgeon: Bernarda Caffey, MD;  Location: [place];  Service: Ophthalmology;  Laterality: Right;   PARS PLANA VITRECTOMY Right 10/31/2018   Procedure: PARS PLANA VITRECTOMY WITH 25 GAUGE;  Surgeon:  Bernarda Caffey, MD;  Location: [place];  Service: Ophthalmology;  Laterality: Right;   PHOTOCOAGULATION WITH LASER Right 10/31/2018   Procedure: Photocoagulation With Laser;  Surgeon: Bernarda Caffey, MD;  Location: [place];  Service: Ophthalmology;  Laterality: Right;   TRANSTHORACIC ECHOCARDIOGRAM  08/23/2020   Grd I DD, o/w normal.   FAMILY HISTORY Family History  Problem Relation Age of Onset   Brain cancer Mother    Diabetes Father    Diabetes Maternal Grandmother    Cataracts Maternal Grandmother    Cervical cancer Paternal Grandmother    Colon cancer Maternal Grandfather 20   Amblyopia Neg Hx    Blindness Neg Hx    Glaucoma Neg Hx    Macular degeneration Neg Hx    Retinal detachment Neg Hx    Strabismus Neg Hx    Retinitis pigmentosa Neg Hx    Esophageal cancer Neg Hx    Stomach cancer Neg Hx    Rectal cancer Neg Hx    SOCIAL HISTORY Social History   Tobacco Use   Smoking status: Never   Smokeless tobacco: Never  Vaping Use   Vaping Use: Never used  Substance Use Topics   Alcohol use: No    Drug use: No       OPHTHALMIC EXAM:  Base Eye Exam     Visual Acuity (Snellen - Linear)       Right Left   Dist [place] 20/25 -1    Dist cc  20/30 -2         Tonometry (Tonopen, 1:56 PM)       Right Left   Pressure 15 15         Pupils       Pupils Dark Light Shape React APD   Right PERRL 3 2 Round Brisk None   Left PERRL 3 2 Round Brisk None         Visual Fields       Left Right    Full          Extraocular Movement       Right Left    Full, Ortho Full, Ortho         Neuro/Psych     Oriented x3: Yes   Mood/Affect: Normal         Dilation     Both eyes: 2.5% Phenylephrine @ 1:56 PM           Slit Lamp and Fundus Exam     Slit Lamp Exam       Right Left   Lids/Lashes Dermatochalasis - upper lid, mild Meibomian gland dysfunction, Telangiectasia Dermatochalasis - upper lid, Meibomian gland dysfunction, Telangiectasia   Conjunctiva/Sclera White and quiet White and quiet   Cornea Clear, well healed temporal cataract wounds Trace Punctate epithelial erosions, trace EBMD   Anterior Chamber Deep and quiet Deep and quiet   Iris Round and dilated, No NVI Round and dilated, No NVI   Lens PC IOL in good position, 1-2+ Posterior capsular opacification, PC folds 2-3+ Nuclear sclerosis with mild brunescence, 2-3+ Cortical cataract, 1+ Posterior subcapsular cataract   Anterior Vitreous post vitrectomy, trace pigment Vitreous syneresis, mild vitreous condensations, no heme         Fundus Exam       Right Left   Disc mild Pallor, Sharp rim, temporal PPA Pink and sharp, Compact, PPA   C/D Ratio 0.2 0.0   Macula Flat, good foveal reflex, mild cystic changes -- slightly improved, focal MA  and exudates ST mac, +focal laser changes Flat, good foveal reflex, trace cystic changes -- stably improved, scattered MA; trace ERM, light focal laser changes, no edema   Vessels attenuated, Tortuous attenuated, Tortuous   Periphery Attached, rare MA, scattered DBH  greatest posteriorly, 360 PRP, good laser fill in 360 attached, scattered IRH; 360 PRP scars -- with good fill in changes           Refraction     Wearing Rx       Sphere Cylinder   Right -3.75 Sphere   Left -3.75 Sphere           IMAGING AND PROCEDURES  Imaging and Procedures for   OCT, Retina - OU - Both Eyes       Right Eye Quality was good. Central Foveal Thickness: 320. Progression has improved. Findings include no SRF, abnormal foveal contour, intraretinal hyper-reflective material, epiretinal membrane, intraretinal fluid (Mild interval improvement in IRF/cystic changes temporal macula and fovea ).   Left Eye Quality was good. Central Foveal Thickness: 284. Progression has been stable. Findings include normal foveal contour, no IRF, no SRF, intraretinal hyper-reflective material (Stable improvement in IRF/cystic changes temporal fovea, stable improvement in vitreous opacities).   Notes *Images captured and stored on drive  Diagnosis / Impression:  DME OU OD: Mild interval improvement in IRF/cystic changes temporal macula and fovea  OS: stable improvement in IRF/cystic changes temporal fovea, stable improvement in vitreous opacities  Clinical management:  See below  Abbreviations: NFP - Normal foveal profile. CME - cystoid macular edema. PED - pigment epithelial detachment. IRF - intraretinal fluid. SRF - subretinal fluid. EZ - ellipsoid zone. ERM - epiretinal membrane. ORA - outer retinal atrophy. ORT - outer retinal tubulation. SRHM - subretinal hyper-reflective material       Intravitreal Injection, Pharmacologic Agent - OD - Right Eye       Time Out 04/04/2022. 2:28 PM. Confirmed correct patient, procedure, site, and patient consented.   Anesthesia Topical anesthesia was used. Anesthetic medications included Lidocaine 2%, Proparacaine 0.5%.   Procedure Preparation included 5% betadine to ocular surface, eyelid speculum. A (32g) needle was used.    Injection: 6 mg faricimab-svoa 6 MG/0.05ML   Route: Intravitreal, Site: Right Eye   NDC: [phone], Lot: [id number], Expiration date: 03/29/2024, Waste: 0 mL   Post-op Post injection exam found visual acuity of at least counting fingers. The patient tolerated the procedure well. There were no complications. The patient received written and verbal post procedure care education. Post injection medications were not given.      Intravitreal Injection, Pharmacologic Agent - OS - Left Eye       Time Out 04/04/2022. 2:28 PM. Confirmed correct patient, procedure, site, and patient consented.   Anesthesia Topical anesthesia was used. Anesthetic medications included Lidocaine 2%, Proparacaine 0.5%.   Procedure Preparation included 5% betadine to ocular surface, eyelid speculum. A (32g) needle was used.   Injection: 2 mg aflibercept 2 MG/0.05ML   Route: Intravitreal, Site: Left Eye   NDC: [id number], Lot: [id number], Expiration date: 04/27/2023, Waste: 0 mL   Post-op Post injection exam found visual acuity of at least counting fingers. The patient tolerated the procedure well. There were no complications. The patient received written and verbal post procedure care education. Post injection medications were not given.            ASSESSMENT/PLAN:   ICD-10-CM   1. Proliferative diabetic retinopathy of both eyes with macular edema associated with type 2 diabetes mellitus (  [place])  X79.3903 OCT, Retina - OU - Both Eyes    Intravitreal Injection, Pharmacologic Agent - OD - Right Eye    Intravitreal Injection, Pharmacologic Agent - OS - Left Eye    aflibercept (EYLEA) SOLN 2 mg    faricimab-svoa (VABYSMO) '6mg'$ /0.19m intravitreal injection    2. Vitreous hemorrhage of left eye (HCC)  H43.12     3. Vitreous hemorrhage, right eye ([place]  H43.11     4. Essential hypertension  I10     5. Right endophthalmia  H44.001     6. Hypertensive retinopathy of both eyes  H35.033     7. Combined  forms of age-related cataract of left eye  H25.812     8. Pseudophakia of right eye  Z96.1      1.  Proliferative diabetic retinopathy w/ DME, OU  - HbA1c 7.1% (3.23.23), 8.6% (9.14.22), 7.8% (06.13.22), 9.1% (02.24.22) 11.0% (11.22.21) - s/p IVA OD #1 9.20.19, #2 (10.25.19), #3 (11.15.19), #4 (12.17.19), #5 (01.14.20), #6 (2.11.20), #7 (05.29.20), #8 (08.19.20), #9 (10.30.20), #10 (12.09.20), #11 (01.11.21), #12 (02.15.21), #13 (03.23.21), #14 (04.20.21), #15 (06.08.21), #16 (07.06.21) -- IVA resistance - s/p IVA OS #1 9.27.19, #2 (10.25.19), #3 (11.15.19), #4 (12.17.19), #5 (01.14.20), #6 (2.11.20), #7 (04.26.20), #8 (05.29.20), #9 (06.26.20), #10 (08.05.20), #11 (11.11.20), #12 (12.09.20), #13 (01.11.21), #14 (02.15.21), #15 (03.23.21), #16 (04.20.21), #17 (06.08.21) -- IVA resistance, #18 (09.27.21) - s/p IVE OD #1 (08.03.21) -- sample, #2 (09.10.21), #3 (10.08.21), #4 (11.23.21), #5 (12.21.21), #6 (01.19.22) sample, #7 (2.16.22), #8 (3.22.22), #9 (04.19.22), #10 (05.27.22), #11 (06.29.22), #12 (08.03.22), #13 (09.07.22), #14 (10.10.22), #15 (11.14.22), #16 (12.12.22), #17 (01.10.23) - sample, #18 (02.07.23), #19 (03.21.23), #20 (04.18.23), #21 (05.16.23) -- IVE resistance  - s/p IVE OS #1 (10.25.21), #2 (11.23.21), #3 (12.21.21), #4 (3.22.22), #5 (06.29.22), #6 (08.03.22), #7 (09.07.22), #8 (10.10.22), #9 (11.14.22), #10 (02.07.23), #11 (05.16.23), #12 (08.15.23), #13 (11.14.23)  - IVV OD #1 (06.16.23 -- sample), #2 (07.17.23), #3 (08.15.23), #4 (09.12.23), #5 (10.10.23), #6 (11.14.23), #7 (12.12.23), #8 (01.09.23)  - S/P PRP OS (09.20.19), (5.19.20), (08.19.20), (04.28.21), (02.02.22)  - S/P PRP OD (9.27.19 and 11.21.19), fill-in (04.14.20) (09.03.20, surgery)  - S/P focal laser OS (07.06.21), OD (09.20.22)  - FA (9.20.19) shows +NVE OU and leaking MA and capillary nonperfusion  - repeat FA 11.15.19 shows NV regressing OU  - pre-op: OD w/ VA stable at 20/25, but there is some preretinal  fibrosis / tractional membranes just superior to disc and mild central DME  - s/p 25g PPV+MP+10% C3F8 gas OD (09.03.20) -- ERM/PRF removal OD  - BCVA OD: 20/25 -- improved, OS: 20/30 --decreased             - fibrosis/ERM stably improved; retina attached  - OCT shows OD: Mild interval improvement in IRF/cystic changes temporal macula and fovea ; OS: stable improvement in IRF/cystic changes temporal fovea, stable improvement in vitreous opacities at 4 wks  - recommend IVV OD #9 and IVE OS #14 today 02.06.24  - new health insurance for 2024 -- authorizations for Vabysmo and Eylea approved  - **OS on ~q347maintenance treatment schedule**  - pt in agreement  - RBA of procedure discussed, questions answered - see procedure note  - Eylea informed consent form re-signed and scanned on 05.16.2023  - Vabysmo informed consent form signed on 06.16.23  - f/u 5 weeks, DFE, OCT, possible injection  2. Vitreous hemorrhage OS -- stably improved  - recurrent VH, onset 09.22.21  - etiology: secondary to PDR as  described above (no RT/RD on exam)  - s/p PRP OS (9.20.19), (05.19.20), (08.19.20), (04.28.21), (02.02.22)  - s/p IVA OS on 4.26.20, 5.29.20, 6.26.20, 8.5.20,11.11.20, 12.06.20 and so on as above   - VH clear centrally and settled inferiorly -- now white  - BCVA stable at 20/30  - f/u 5 weeks DFE, OCT  3. History of Endophthalmitis OD  - s/p IVA OU 04/09/2018  - s/p 25g PPV w/ intravitreal vanc, ceftaz and cefepime OD, 2.14.2020  - s/p intravitreal tap / vanc and ceflaz injections (02.16.20)             - gram stain (2.14.20) shows G+ cocci, WBCs mostly PMNs;   - repeat gram stain from t/i (2.16.20) -- no organisms, just WBCs             - cultures from vitreous grew rare Staph warneri; cultures from t/i -- no growth             - doing well, BCVA 20/30             - inflammation/posterior debris resolved             - IOP 15 off Brimonidine  - completed po pred taper -- caused significant  elevations in BG  - monitor  4. History of Vitreous Hemorrhage OD -- cleared from PPV x2 for endophthalmitis and ERM/preretinal fibrosis   - secondary to PDR  5,6. Hypertensive retinopathy OU  - discussed importance of tight BP control.  - monitor  7. Combined form age related cataract OS  - The symptoms of cataract, surgical options, and treatments and risks were discussed with patient.   - discussed diagnosis and progression  8. Pseudophakia OD  - s/p CE/IOL OD (Dr. Zenia Resides, 12.11.20)  - beautiful surgeries, doing well  Ophthalmic Meds Ordered this visit:  Meds ordered this encounter  Medications   aflibercept (EYLEA) SOLN 2 mg   faricimab-svoa (VABYSMO) '6mg'$ /0.73m intravitreal injection     Return in about 5 weeks (around 05/09/2022) for f/u PDR OU, DFE, OCT.  There are no Patient Instructions on file for this visit.  This document serves as a record of services personally performed by BGardiner Sleeper MD, PhD. It was created on their behalf by CRenaldo Reel CClaytonan ophthalmic technician. The creation of this record is the provider's dictation and/or activities during the visit.    Electronically signed by:  CRenaldo Reel COT  12.28.23 4:48 PM  This document serves as a record of services personally performed by BGardiner Sleeper MD, PhD. It was created on their behalf by ASan Jetty BOwens Shark OA an ophthalmic technician. The creation of this record is the provider's dictation and/or activities during the visit.    Electronically signed by: ASan Jetty BOwens Shark ONew [place]01.09.2024 4:48 PM  BGardiner Sleeper M.D., Ph.D. Diseases & Surgery of the Retina and Vitreous [facility] I have reviewed the above documentation for accuracy and completeness, and I agree with the above. BGardiner Sleeper M.D., Ph.D. 04/04/22 4:50 PM  Abbreviations: M myopia (nearsighted); A astigmatism; H hyperopia (farsighted); P presbyopia; Mrx spectacle prescription;  CTL contact  lenses; OD right eye; OS left eye; OU both eyes  XT exotropia; ET esotropia; PEK punctate epithelial keratitis; PEE punctate epithelial erosions; DES dry eye syndrome; MGD meibomian gland dysfunction; ATs artificial tears; PFAT's preservative free artificial tears; NLittle Eaglenuclear sclerotic cataract; PSC posterior subcapsular cataract; ERM epi-retinal membrane; PVD posterior vitreous detachment; RD  retinal detachment; DM diabetes mellitus; DR diabetic retinopathy; NPDR non-proliferative diabetic retinopathy; PDR proliferative diabetic retinopathy; CSME clinically significant macular edema; DME diabetic macular edema; dbh dot blot hemorrhages; CWS cotton wool spot; POAG primary open angle glaucoma; C/D cup-to-disc ratio; HVF humphrey visual field; GVF goldmann visual field; OCT optical coherence tomography; IOP intraocular pressure; BRVO Branch retinal vein occlusion; CRVO central retinal vein occlusion; CRAO central retinal artery occlusion; BRAO branch retinal artery occlusion; RT retinal tear; SB scleral buckle; PPV pars plana vitrectomy; VH Vitreous hemorrhage; PRP panretinal laser photocoagulation; IVK intravitreal kenalog; VMT vitreomacular traction; MH Macular hole;  NVD neovascularization of the disc; NVE neovascularization elsewhere; AREDS age related eye disease study; ARMD age related macular degeneration; POAG primary open angle glaucoma; EBMD epithelial/anterior basement membrane dystrophy; ACIOL anterior chamber intraocular lens; IOL intraocular lens; PCIOL posterior chamber intraocular lens; Phaco/IOL phacoemulsification with intraocular lens placement; [place] photorefractive keratectomy; LASIK laser assisted in situ keratomileusis; HTN hypertension; DM diabetes mellitus; COPD chronic obstructive pulmonary disease

## 2022-03-31 ENCOUNTER — Telehealth: Payer: Self-pay

## 2022-03-31 MED ORDER — HYDROCODONE-ACETAMINOPHEN 5-325 MG PO TABS: 1.0000 | ORAL_TABLET | Freq: Four times a day (QID) | ORAL | 0 refills | Status: DC | PRN

## 2022-03-31 NOTE — Telephone Encounter (Signed)
Patient returning call. Please call back when available.

## 2022-03-31 NOTE — Telephone Encounter (Signed)
Pt advised of rx

## 2022-03-31 NOTE — Telephone Encounter (Signed)
Correct, I have never prescribed Tylenol with codeine for her. However, I suspect she means hydrocodone with Tylenol.  I have prescribed this for her in the past, most recently in August. I went ahead and sent in a prescription just now.

## 2022-03-31 NOTE — Telephone Encounter (Signed)
Tried calling patient, unable to LVM.  

## 2022-03-31 NOTE — Telephone Encounter (Signed)
Patient called at 4:57pm on 03/30/22, telephone encounter created on 2/2.  Patient refill request.  Gloria Gomez Patient states it has been a while since Dr. Anitra Lauth has filled Tylenol #3. I am not sure of the generic name.  Please call [phone].

## 2022-03-31 NOTE — Telephone Encounter (Signed)
No prior rx found for medication.   Please review and advise

## 2022-03-31 NOTE — Addendum Note (Signed)
Addended by: Tammi Sou on: 03/31/2022 02:40 PM   Modules accepted: Orders

## 2022-04-04 ENCOUNTER — Encounter (INDEPENDENT_AMBULATORY_CARE_PROVIDER_SITE_OTHER): Payer: Self-pay | Admitting: Ophthalmology

## 2022-04-04 ENCOUNTER — Ambulatory Visit (INDEPENDENT_AMBULATORY_CARE_PROVIDER_SITE_OTHER): Payer: Commercial Managed Care - HMO | Admitting: Ophthalmology

## 2022-04-04 DIAGNOSIS — H25812 Combined forms of age-related cataract, left eye: Secondary | ICD-10-CM

## 2022-04-04 DIAGNOSIS — H35033 Hypertensive retinopathy, bilateral: Secondary | ICD-10-CM

## 2022-04-04 DIAGNOSIS — H44001 Unspecified purulent endophthalmitis, right eye: Secondary | ICD-10-CM

## 2022-04-04 DIAGNOSIS — H4311 Vitreous hemorrhage, right eye: Secondary | ICD-10-CM

## 2022-04-04 DIAGNOSIS — I10 Essential (primary) hypertension: Secondary | ICD-10-CM | POA: Diagnosis not present

## 2022-04-04 DIAGNOSIS — Z961 Presence of intraocular lens: Secondary | ICD-10-CM

## 2022-04-04 DIAGNOSIS — E113513 Type 2 diabetes mellitus with proliferative diabetic retinopathy with macular edema, bilateral: Secondary | ICD-10-CM | POA: Diagnosis not present

## 2022-04-04 DIAGNOSIS — H4313 Vitreous hemorrhage, bilateral: Secondary | ICD-10-CM | POA: Diagnosis not present

## 2022-04-04 DIAGNOSIS — H4312 Vitreous hemorrhage, left eye: Secondary | ICD-10-CM

## 2022-04-04 MED ORDER — AFLIBERCEPT 2MG/0.05ML IZ SOLN FOR KALEIDOSCOPE
2.0000 mg | INTRAVITREAL | Status: AC | PRN
  Administered 2022-04-04: 2 mg via INTRAVITREAL

## 2022-04-04 MED ORDER — FARICIMAB-SVOA 6 MG/0.05ML IZ SOLN
6.0000 mg | INTRAVITREAL | Status: AC | PRN
  Administered 2022-04-04: 6 mg via INTRAVITREAL

## 2022-04-11 ENCOUNTER — Encounter: Payer: Self-pay | Admitting: Gastroenterology

## 2022-04-14 ENCOUNTER — Other Ambulatory Visit: Payer: Self-pay | Admitting: Family Medicine

## 2022-04-25 ENCOUNTER — Other Ambulatory Visit: Payer: Self-pay | Admitting: Family Medicine

## 2022-05-01 NOTE — Progress Notes (Signed)
[facility] Note  05/08/2022     CHIEF COMPLAINT Patient presents for Retina Follow Up  HISTORY OF PRESENT ILLNESS: Gloria Gomez is a 52 y.o. female who presents to the clinic today for:  HPI     Retina Follow Up   In both eyes.  This started 5 weeks ago.  Duration of 5 weeks.  Since onset it is stable.  I, the attending physician,  performed the HPI with the patient and updated documentation appropriately.        Comments   5 week retina follow up PDR OU and IVV OD I'VE OS pt is reporting no vision changes noticed pt fell yesterday and had some floaters that has since went away denies any flashes of light       Last edited by Bernarda Caffey, MD on 05/09/2022  5:06 PM.    Pt fell last night at church and sprained her left ankle, pt states she saw floaters afterwards, but they have since gone away  Referring physician:   HISTORICAL INFORMATION:   Selected notes from the [place] Referred for DM exam   CURRENT MEDICATIONS: No current outpatient medications on file. (Ophthalmic Drugs)   No current facility-administered medications for this visit. (Ophthalmic Drugs)   Current Outpatient Medications (Other)  Medication Sig   amLODipine (NORVASC) 5 MG tablet Take 1 tablet (5 mg total) by mouth daily.   Continuous Blood Gluc Sensor (FREESTYLE LIBRE 3 SENSOR) MISC by Does not apply route every 14 (fourteen) days.   FARXIGA 10 MG TABS tablet Take 10 mg by mouth daily.   furosemide (LASIX) 20 MG tablet TAKE 2 TABLETS BY MOUTH IN THE MORNING AND 1 IN THE EVENING   gabapentin (NEURONTIN) 300 MG capsule TAKE 1 CAPSULE BY MOUTH THREE TIMES DAILY   HUMALOG KWIKPEN 100 UNIT/ML KwikPen Inject into the skin.   HYDROcodone-acetaminophen (NORCO/VICODIN) 5-325 MG tablet Take 1-2 tablets by mouth every 6 (six) hours as needed for moderate pain.   hydroxychloroquine (PLAQUENIL) 200 MG tablet    insulin aspart (NOVOLOG) 100 UNIT/ML injection Inject  10 Units into the skin 2 (two) times daily with a meal. Per endocrinologist   Insulin Glargine (BASAGLAR KWIKPEN) 100 UNIT/ML 10 units every morning and 24 units nightly   losartan (COZAAR) 100 MG tablet Take 1 tablet by mouth once daily   meloxicam (MOBIC) 15 MG tablet Take 1 tablet daily as needed.   metFORMIN (GLUCOPHAGE) 1000 MG tablet TAKE 1 TABLET BY MOUTH TWICE DAILY WITH MEALS   metoCLOPramide (REGLAN) 5 MG tablet TAKE 1 TABLET BY MOUTH 4 TIMES DAILY BEFORE MEAL(S) AND AT BEDTIME   metoprolol succinate (TOPROL-XL) 100 MG 24 hr tablet Take 2 tablets by mouth once daily   Multiple Vitamin (MULTIVITAMIN WITH MINERALS) TABS tablet Take 1 tablet by mouth daily.   NOVOLOG FLEXPEN 100 UNIT/ML FlexPen    pantoprazole (PROTONIX) 40 MG tablet TAKE 1 TABLET BY MOUTH ONCE DAILY .   promethazine (PHENERGAN) 12.5 MG tablet TAKE 1 TO 2 TABLETS BY MOUTH EVERY 6 HOURS AS NEEDED FOR NAUSEA   rosuvastatin (CRESTOR) 10 MG tablet Take 1 tablet (10 mg total) by mouth daily.   verapamil (CALAN-SR) 240 MG CR tablet TAKE 1 TABLET BY MOUTH AT BEDTIME   verapamil (CALAN-SR) 240 MG CR tablet Take by mouth.   VICTOZA 18 MG/3ML SOPN INJECT 1.'2MG'$  INTO THE SKIN DAILY (Patient taking differently: 1.8 mg. INJECT 1.'2MG'$  INTO THE SKIN DAILY)  No current facility-administered medications for this visit. (Other)   REVIEW OF SYSTEMS: ROS   Positive for: Endocrine, Eyes Negative for: Constitutional, Gastrointestinal, Neurological, Skin, Genitourinary, Musculoskeletal, HENT, Cardiovascular, Respiratory, Psychiatric, Allergic/Imm, Heme/Lymph Last edited by Parthenia Ames, COT on 05/08/2022  2:53 PM.      ALLERGIES Allergies  Allergen Reactions   Gluten Meal Swelling   Lisinopril Cough   Pioglitazone Other (See Comments)    ELEVATED glucoses + worse chronic nausea   PAST MEDICAL HISTORY Past Medical History:  Diagnosis Date   Abdominal bloating    likely from diab gastroparesis.  Dr. Havery Moros started  trial of reglan 03/2019.   Benign brain tumor ([place])    Cystic lesion in cerebral aqueduct region with mild hydrocephalus-- stable MRI 02/2016.  Surveillance MRI 05/2017 --dilated cerebral aqueduct related to aqueductal stenosis and subsequent mild hydrocephalus (due to the 11 mm stable cystic lesion in cerebral aqueduct---?congenitial?.   Cataract    OU   Dysmenorrhea    vicodin occ during first 2 days of cycle.   Fibromyalgia    Gluten intolerance    pt reports she underwent full GI w/u to r/o celiac dz   Hepatic steatosis    ultrasound 08/2017. Hx of very mild elevation of ALT.  Stable on u/s 06/2020   History of adenomatous polyp of colon 04/08/2019   recall Feb 2024   Hyperlipidemia, mixed    Hypertension    +white coat component   Hypertensive retinopathy of both eyes    Insomnia    Iron deficiency 01/2019   Hb 11.3. Hemoccults neg x 3 03/19/19. EGD and colonoscopy 04/08/19 showed NO cause for IDA.  Pt does have menorrhagia, though, so she'll see her GYN.  started FeSO4 325 qd approx 04/14/19.   Menorrhagia    resulting in IDA 2021   PMR (polymyalgia rheumatica) ([place]) 2022   question of; hx of elevated ESR-->better with prednisone but prednisone was contraindicated d/t eye issues/hyperglycemia-->rheum started following her 02/2021->plaquenil 04/2021   Proliferative diabetic retinopathy of both eyes ([place])    steroid injections 10/2017--improved   Sensorineural hearing loss of left ear    Sudden left hearing loss summer 2016--no improvement with steroids 01/2015 so brain MRI done by Dr. Redmond Baseman and it showed brain tumor that was determined to be benign.  Pt's hearing not bad enough for hearing aid as of 06/2016.   Type 2 diabetes with complication (HCC)    +microalbuminuria, diab retpthy, diabetic gastroparesis (gastric emptying study mildly abnl 03/2017).  Recommended lantus 08/2018 but pt declined. Mild microalbuminuria.   Uterine fibroid 2022   per pt report, pelvic u/s in GYN office   Past  Surgical History:  Procedure Laterality Date   ANOSCOPY  05/12/2019   Procedure: normal exam, minimal hemorrhoid disease. Hyertrophied anal papila, benign appearing, posterior midline. Surgeon: Leighton Ruff MD   CHOLECYSTECTOMY  2000   COLONOSCOPY  04/08/2019   5 adenomas, recall 3 yrs; no cause for IDA found.  Hypertrophied anal papillae->bx showed low grade dysplasia; GI referred her to colorectal surgeon.   ESOPHAGOGASTRODUODENOSCOPY  04/08/2019   mild chronic reactive gastritis. H pylori NEG.  No cause for IDA found.   GAS INSERTION Right 10/31/2018   Procedure: Insertion Of C3F8 Gas;  Surgeon: Bernarda Caffey, MD;  Location: [facility];  Service: Ophthalmology;  Laterality: Right;   GASTRIC EMPTYING SCAN  04/20/2017   Mildly abnormal, particularly the 1st hour of emptying.   MEMBRANE PEEL Right 10/31/2018   Procedure: MEMBRANE PEEL;  Surgeon: Bernarda Caffey, MD;  Location: [facility];  Service: Ophthalmology;  Laterality: Right;   PARS PLANA VITRECTOMY Right 04/12/2018   Procedure: Right PARS PLANA VITRECTOMY WITH 25 GAUGE with intravitreal antibiotics;  Surgeon: Bernarda Caffey, MD;  Location: [facility];  Service: Ophthalmology;  Laterality: Right;   PARS PLANA VITRECTOMY Right 10/31/2018   Procedure: PARS PLANA VITRECTOMY WITH 25 GAUGE;  Surgeon: Bernarda Caffey, MD;  Location: [facility];  Service: Ophthalmology;  Laterality: Right;   PHOTOCOAGULATION WITH LASER Right 10/31/2018   Procedure: Photocoagulation With Laser;  Surgeon: Bernarda Caffey, MD;  Location: [place];  Service: Ophthalmology;  Laterality: Right;   TRANSTHORACIC ECHOCARDIOGRAM  08/23/2020   Grd I DD, o/w normal.   FAMILY HISTORY Family History  Problem Relation Age of Onset   Brain cancer Mother    Diabetes Father    Diabetes Maternal Grandmother    Cataracts Maternal Grandmother    Cervical cancer Paternal Grandmother    Colon cancer Maternal Grandfather 76   Amblyopia Neg Hx    Blindness Neg Hx    Glaucoma Neg Hx    Macular  degeneration Neg Hx    Retinal detachment Neg Hx    Strabismus Neg Hx    Retinitis pigmentosa Neg Hx    Esophageal cancer Neg Hx    Stomach cancer Neg Hx    Rectal cancer Neg Hx    SOCIAL HISTORY Social History   Tobacco Use   Smoking status: Never   Smokeless tobacco: Never  Vaping Use   Vaping Use: Never used  Substance Use Topics   Alcohol use: No   Drug use: No       OPHTHALMIC EXAM:  Base Eye Exam     Visual Acuity (Snellen - Linear)       Right Left   Dist [place] 20/25 -1    Dist cc  20/30 -1    Correction: Glasses         Tonometry (Tonopen, 2:57 PM)       Right Left   Pressure 14 16         Pupils       Pupils Dark Light Shape React APD   Right PERRL 3 2 Round Brisk None   Left PERRL 3 2 Round Brisk None         Visual Fields       Left Right    Full Full         Extraocular Movement       Right Left    Full, Ortho Full, Ortho         Neuro/Psych     Oriented x3: Yes   Mood/Affect: Normal         Dilation     Both eyes: 2.5% Phenylephrine @ 2:57 PM           Slit Lamp and Fundus Exam     Slit Lamp Exam       Right Left   Lids/Lashes Dermatochalasis - upper lid, mild Meibomian gland dysfunction, Telangiectasia Dermatochalasis - upper lid, Meibomian gland dysfunction, Telangiectasia   Conjunctiva/Sclera White and quiet White and quiet   Cornea Clear, well healed temporal cataract wounds Trace Punctate epithelial erosions, trace EBMD   Anterior Chamber Deep and quiet Deep and quiet   Iris Round and dilated, No NVI Round and dilated, No NVI   Lens PC IOL in good position, 1-2+ Posterior capsular opacification, PC folds 2-3+ Nuclear sclerosis with mild brunescence, 2-3+ Cortical cataract,  1+ Posterior subcapsular cataract   Anterior Vitreous post vitrectomy, trace pigment Vitreous syneresis, mild vitreous condensations, no heme, old white VH settled inferiorly         Fundus Exam       Right Left   Disc mild  Pallor, Sharp rim, temporal PPA Pink and sharp, Compact, PPA   C/D Ratio 0.2 0.0   Macula Flat, good foveal reflex, mild cystic changes temporal macula -- slightly increased, focal MA and exudates ST mac, +focal laser changes Flat, good foveal reflex, trace cystic changes -- stably improved, scattered MA; trace ERM, light focal laser changes, no edema   Vessels attenuated, Tortuous attenuated, Tortuous   Periphery Attached, rare MA, scattered DBH greatest posteriorly, 360 PRP, good laser fill in 360 attached, scattered IRH; 360 PRP scars -- with good fill in changes           Refraction     Wearing Rx       Sphere Cylinder   Right -3.75 Sphere   Left -3.75 Sphere           IMAGING AND PROCEDURES  Imaging and Procedures for   DG Ankle Complete Left       CLINICAL DATA:  Fall, ankle pain  EXAM: LEFT ANKLE COMPLETE - 3+ VIEW  COMPARISON:  None Available.  FINDINGS: Normal alignment without acute osseous finding or fracture. Malleoli, talus and calcaneus intact. No joint abnormality. Diffuse lower extremity, ankle and foot edema noted.  IMPRESSION: Diffuse soft tissue swelling. No acute osseous finding.   Electronically Signed   By: Jerilynn Mages.  Shick M.D.   On: 05/08/2022 12:00      OCT, Retina - OU - Both Eyes       Right Eye Quality was good. Central Foveal Thickness: 373. Progression has worsened. Findings include no SRF, abnormal foveal contour, intraretinal hyper-reflective material, epiretinal membrane, intraretinal fluid (interval increase in IRF/cystic changes temporal macula and fovea ).   Left Eye Quality was good. Central Foveal Thickness: 278. Progression has been stable. Findings include normal foveal contour, no IRF, no SRF, intraretinal hyper-reflective material (Stable improvement in IRF/cystic changes temporal fovea, stable improvement in vitreous opacities).   Notes *Images captured and stored on drive  Diagnosis / Impression:  DME OU OD:  interval increase in IRF/cystic changes temporal macula and fovea  OS: stable improvement in IRF/cystic changes temporal fovea, stable improvement in vitreous opacities  Clinical management:  See below  Abbreviations: NFP - Normal foveal profile. CME - cystoid macular edema. PED - pigment epithelial detachment. IRF - intraretinal fluid. SRF - subretinal fluid. EZ - ellipsoid zone. ERM - epiretinal membrane. ORA - outer retinal atrophy. ORT - outer retinal tubulation. SRHM - subretinal hyper-reflective material       Intravitreal Injection, Pharmacologic Agent - OD - Right Eye       Time Out 05/08/2022. 4:31 PM. Confirmed correct patient, procedure, site, and patient consented.   Anesthesia Topical anesthesia was used. Anesthetic medications included Lidocaine 2%, Proparacaine 0.5%.   Procedure Preparation included 5% betadine to ocular surface, eyelid speculum. A (32g) needle was used.   Injection: 6 mg faricimab-svoa 6 MG/0.05ML   Route: Intravitreal, Site: Right Eye   [id number], Lot: [id number], Expiration date: 04/26/2024, Waste: 0 mL   Post-op Post injection exam found visual acuity of at least counting fingers. The patient tolerated the procedure well. There were no complications. The patient received written and verbal post procedure care education. Post injection medications were not given.  ASSESSMENT/PLAN:   ICD-10-CM   1. Proliferative diabetic retinopathy of both eyes with macular edema associated with type 2 diabetes mellitus (HCC)  E11.3513 OCT, Retina - OU - Both Eyes    Intravitreal Injection, Pharmacologic Agent - OD - Right Eye    faricimab-svoa (VABYSMO) '6mg'$ /0.56m intravitreal injection    CANCELED: Intravitreal Injection, Pharmacologic Agent - OS - Left Eye    2. Vitreous hemorrhage of left eye (HCC)  H43.12     3. Vitreous hemorrhage, right eye ([place]  H43.11     4. Essential hypertension  I10     5. Right endophthalmia  H44.001      6. Hypertensive retinopathy of both eyes  H35.033     7. Combined forms of age-related cataract of left eye  H25.812     8. Pseudophakia of right eye  Z96.1      1.  Proliferative diabetic retinopathy w/ DME, OU  - HbA1c 7.1% (3.23.23), 8.6% (9.14.22), 7.8% (06.13.22), 9.1% (02.24.22) 11.0% (11.22.21) - s/p IVA OD #1 9.20.19, #2 (10.25.19), #3 (11.15.19), #4 (12.17.19), #5 (01.14.20), #6 (2.11.20), #7 (05.29.20), #8 (08.19.20), #9 (10.30.20), #10 (12.09.20), #11 (01.11.21), #12 (02.15.21), #13 (03.23.21), #14 (04.20.21), #15 (06.08.21), #16 (07.06.21) -- IVA resistance - s/p IVA OS #1 9.27.19, #2 (10.25.19), #3 (11.15.19), #4 (12.17.19), #5 (01.14.20), #6 (2.11.20), #7 (04.26.20), #8 (05.29.20), #9 (06.26.20), #10 (08.05.20), #11 (11.11.20), #12 (12.09.20), #13 (01.11.21), #14 (02.15.21), #15 (03.23.21), #16 (04.20.21), #17 (06.08.21) -- IVA resistance, #18 (09.27.21) - s/p IVE OD #1 (08.03.21) -- sample, #2 (09.10.21), #3 (10.08.21), #4 (11.23.21), #5 (12.21.21), #6 (01.19.22) sample, #7 (2.16.22), #8 (3.22.22), #9 (04.19.22), #10 (05.27.22), #11 (06.29.22), #12 (08.03.22), #13 (09.07.22), #14 (10.10.22), #15 (11.14.22), #16 (12.12.22), #17 (01.10.23) - sample, #18 (02.07.23), #19 (03.21.23), #20 (04.18.23), #21 (05.16.23) -- IVE resistance  - s/p IVE OS #1 (10.25.21), #2 (11.23.21), #3 (12.21.21), #4 (3.22.22), #5 (06.29.22), #6 (08.03.22), #7 (09.07.22), #8 (10.10.22), #9 (11.14.22), #10 (02.07.23), #11 (05.16.23), #12 (08.15.23), #13 (11.14.23), #14 (02.06.24)  - IVV OD #1 (06.16.23 -- sample), #2 (07.17.23), #3 (08.15.23), #4 (09.12.23), #5 (10.10.23), #6 (11.14.23), #7 (12.12.23), #8 (01.09.23), #9 (02.06.24)  - S/P PRP OS (09.20.19), (5.19.20), (08.19.20), (04.28.21), (02.02.22)  - S/P PRP OD (9.27.19 and 11.21.19), fill-in (04.14.20) (09.03.20, surgery)  - S/P focal laser OS (07.06.21), OD (09.20.22)  - FA (9.20.19) shows +NVE OU and leaking MA and capillary nonperfusion  - repeat FA  11.15.19 shows NV regressing OU  - pre-op: OD w/ VA stable at 20/25, but there is some preretinal fibrosis / tractional membranes just superior to disc and mild central DME  - s/p 25g PPV+MP+10% C3F8 gas OD (09.03.20) -- ERM/PRF removal OD  - BCVA OD: 20/25 -- stable, OS: 20/30 -- stable             - fibrosis/ERM stably improved; retina attached  - OCT shows OD: interval increase in IRF/cystic changes temporal macula and fovea; OS: stable improvement in IRF/cystic changes temporal fovea, stable improvement in vitreous opacities at 5 wks  - recommend IVV OD #10 today 03.11.24 with follow up back to 4 weeks; will hold treatment in OS  - new health insurance for 2024 -- authorizations for Vabysmo and Eylea approved  - **OS on ~[id number] treatment schedule**  - pt in agreement  - RBA of procedure discussed, questions answered - see procedure note  - Eylea informed consent form re-signed and scanned on 05.16.2023  - Vabysmo informed consent form signed on 06.16.23  - f/u 4  weeks, DFE, OCT, possible injection  2. Vitreous hemorrhage OS -- stably improved  - recurrent VH, onset 09.22.21  - etiology: secondary to PDR as described above (no RT/RD on exam)  - s/p PRP OS (9.20.19), (05.19.20), (08.19.20), (04.28.21), (02.02.22)  - s/p IVA OS on 4.26.20, 5.29.20, 6.26.20, 8.5.20,11.11.20, 12.06.20 and so on as above   - VH clear centrally and settled inferiorly -- now white  - BCVA stable at 20/30  - f/u 4 weeks DFE, OCT  3. History of Endophthalmitis OD  - s/p IVA OU 04/09/2018  - s/p 25g PPV w/ intravitreal vanc, ceftaz and cefepime OD, 2.14.2020  - s/p intravitreal tap / vanc and ceflaz injections (02.16.20)             - gram stain (2.14.20) shows G+ cocci, WBCs mostly PMNs;   - repeat gram stain from t/i (2.16.20) -- no organisms, just WBCs             - cultures from vitreous grew rare Staph warneri; cultures from t/i -- no growth             - doing well, BCVA 20/30             -  inflammation/posterior debris resolved             - IOP 15 off Brimonidine  - completed po pred taper -- caused significant elevations in BG  - monitor  4. History of Vitreous Hemorrhage OD -- cleared from PPV x2 for endophthalmitis and ERM/preretinal fibrosis   - secondary to PDR  5,6. Hypertensive retinopathy OU  - discussed importance of tight BP control.  - monitor  7. Combined form age related cataract OS  - The symptoms of cataract, surgical options, and treatments and risks were discussed with patient.   - discussed diagnosis and progression  8. Pseudophakia OD  - s/p CE/IOL OD (Dr. Zenia Resides, 12.11.20)  - beautiful surgeries, doing well  Ophthalmic Meds Ordered this visit:  Meds ordered this encounter  Medications   faricimab-svoa (VABYSMO) '6mg'$ /0.74m intravitreal injection     Return in about 4 weeks (around 06/05/2022) for f/u PDR OU, DFE, OCT.  There are no Patient Instructions on file for this visit.  This document serves as a record of services personally performed by BGardiner Sleeper MD, PhD. It was created on their behalf by MOrvan Falconer an ophthalmic technician. The creation of this record is the provider's dictation and/or activities during the visit.    Electronically signed by: MOrvan Falconer OA, 05/09/22  5:06 PM  This document serves as a record of services personally performed by BGardiner Sleeper MD, PhD. It was created on their behalf by ASan Jetty BOwens Shark OA an ophthalmic technician. The creation of this record is the provider's dictation and/or activities during the visit.    Electronically signed by: ASan Jetty BOwens Shark O[place]03.11.2024 5:06 PM  BGardiner Sleeper M.D., Ph.D. Diseases & Surgery of the Retina and Vitreous [facility] I have reviewed the above documentation for accuracy and completeness, and I agree with the above. BGardiner Sleeper M.D., Ph.D. 05/09/22 5:07 PM  Abbreviations: M myopia (nearsighted); A  astigmatism; H hyperopia (farsighted); P presbyopia; Mrx spectacle prescription;  CTL contact lenses; OD right eye; OS left eye; OU both eyes  XT exotropia; ET esotropia; PEK punctate epithelial keratitis; PEE punctate epithelial erosions; DES dry eye syndrome; MGD meibomian gland dysfunction; ATs artificial tears; PFAT's preservative free artificial  tears; [place] nuclear sclerotic cataract; PSC posterior subcapsular cataract; ERM epi-retinal membrane; PVD posterior vitreous detachment; RD retinal detachment; DM diabetes mellitus; DR diabetic retinopathy; NPDR non-proliferative diabetic retinopathy; PDR proliferative diabetic retinopathy; CSME clinically significant macular edema; DME diabetic macular edema; dbh dot blot hemorrhages; CWS cotton wool spot; POAG primary open angle glaucoma; C/D cup-to-disc ratio; HVF humphrey visual field; GVF goldmann visual field; OCT optical coherence tomography; IOP intraocular pressure; BRVO Branch retinal vein occlusion; CRVO central retinal vein occlusion; CRAO central retinal artery occlusion; BRAO branch retinal artery occlusion; RT retinal tear; SB scleral buckle; PPV pars plana vitrectomy; VH Vitreous hemorrhage; PRP panretinal laser photocoagulation; IVK intravitreal kenalog; VMT vitreomacular traction; MH Macular hole;  NVD neovascularization of the disc; NVE neovascularization elsewhere; AREDS age related eye disease study; ARMD age related macular degeneration; POAG primary open angle glaucoma; EBMD epithelial/anterior basement membrane dystrophy; ACIOL anterior chamber intraocular lens; IOL intraocular lens; PCIOL posterior chamber intraocular lens; Phaco/IOL phacoemulsification with intraocular lens placement; [place] photorefractive keratectomy; LASIK laser assisted in situ keratomileusis; HTN hypertension; DM diabetes mellitus; COPD chronic obstructive pulmonary disease

## 2022-05-02 ENCOUNTER — Other Ambulatory Visit: Payer: Self-pay | Admitting: Family Medicine

## 2022-05-08 ENCOUNTER — Ambulatory Visit
Admission: EM | Admit: 2022-05-08 | Discharge: 2022-05-08 | Disposition: A | Discharge status: Home / Self Care | Payer: Commercial Managed Care - HMO

## 2022-05-08 ENCOUNTER — Encounter (INDEPENDENT_AMBULATORY_CARE_PROVIDER_SITE_OTHER): Payer: Self-pay | Admitting: Ophthalmology

## 2022-05-08 ENCOUNTER — Ambulatory Visit (INDEPENDENT_AMBULATORY_CARE_PROVIDER_SITE_OTHER): Payer: Commercial Managed Care - HMO

## 2022-05-08 ENCOUNTER — Ambulatory Visit (INDEPENDENT_AMBULATORY_CARE_PROVIDER_SITE_OTHER): Payer: Commercial Managed Care - HMO | Admitting: Ophthalmology

## 2022-05-08 DIAGNOSIS — H4311 Vitreous hemorrhage, right eye: Secondary | ICD-10-CM

## 2022-05-08 DIAGNOSIS — S93402A Sprain of unspecified ligament of left ankle, initial encounter: Secondary | ICD-10-CM | POA: Diagnosis not present

## 2022-05-08 DIAGNOSIS — E113513 Type 2 diabetes mellitus with proliferative diabetic retinopathy with macular edema, bilateral: Secondary | ICD-10-CM | POA: Diagnosis not present

## 2022-05-08 DIAGNOSIS — W19XXXA Unspecified fall, initial encounter: Secondary | ICD-10-CM | POA: Diagnosis not present

## 2022-05-08 DIAGNOSIS — M25572 Pain in left ankle and joints of left foot: Secondary | ICD-10-CM | POA: Diagnosis not present

## 2022-05-08 DIAGNOSIS — H4313 Vitreous hemorrhage, bilateral: Secondary | ICD-10-CM | POA: Diagnosis not present

## 2022-05-08 DIAGNOSIS — H4312 Vitreous hemorrhage, left eye: Secondary | ICD-10-CM

## 2022-05-08 DIAGNOSIS — H44001 Unspecified purulent endophthalmitis, right eye: Secondary | ICD-10-CM

## 2022-05-08 DIAGNOSIS — H35033 Hypertensive retinopathy, bilateral: Secondary | ICD-10-CM | POA: Diagnosis not present

## 2022-05-08 DIAGNOSIS — I10 Essential (primary) hypertension: Secondary | ICD-10-CM | POA: Diagnosis not present

## 2022-05-08 DIAGNOSIS — H25812 Combined forms of age-related cataract, left eye: Secondary | ICD-10-CM

## 2022-05-08 DIAGNOSIS — Z961 Presence of intraocular lens: Secondary | ICD-10-CM

## 2022-05-08 LAB — HEMOGLOBIN A1C: Hemoglobin A1C: 8.3

## 2022-05-08 MED ORDER — FARICIMAB-SVOA 6 MG/0.05ML IZ SOLN
6.0000 mg | INTRAVITREAL | Status: AC | PRN
  Administered 2022-05-08: 6 mg via INTRAVITREAL

## 2022-05-08 MED ORDER — MELOXICAM 15 MG PO TABS: ORAL_TABLET | ORAL | 0 refills | Status: DC

## 2022-05-08 NOTE — ED Provider Notes (Signed)
[facility]  Note:  This document was prepared using Systems analyst and may include unintentional dictation errors.  MRN: [id number] DOB: April 23, 1970  Subjective:   Gloria Gomez is a 52 y.o. female presenting for suffering a left ankle injury yesterday.  Patient was walking down a ramp and accidentally fell causing her to twist her left ankle.  Has since had difficulty bearing weight, significant pain of the ankle on either side and swelling.  No current facility-administered medications for this encounter.  Current Outpatient Medications:    HUMALOG KWIKPEN 100 UNIT/ML KwikPen, Inject into the skin., Disp: , Rfl:    hydroxychloroquine (PLAQUENIL) 200 MG tablet, , Disp: , Rfl:    NOVOLOG FLEXPEN 100 UNIT/ML FlexPen, , Disp: , Rfl:    verapamil (CALAN-SR) 240 MG CR tablet, Take by mouth., Disp: , Rfl:    amLODipine (NORVASC) 5 MG tablet, Take 1 tablet (5 mg total) by mouth daily., Disp: 90 tablet, Rfl: 1   Continuous Blood Gluc Sensor (FREESTYLE LIBRE 3 SENSOR) MISC, by Does not apply route every 14 (fourteen) days., Disp: , Rfl:    FARXIGA 10 MG TABS tablet, Take 10 mg by mouth daily., Disp: , Rfl:    furosemide (LASIX) 20 MG tablet, TAKE 2 TABLETS BY MOUTH IN THE MORNING AND 1 IN THE EVENING, Disp: 180 tablet, Rfl: 1   gabapentin (NEURONTIN) 300 MG capsule, TAKE 1 CAPSULE BY MOUTH THREE TIMES DAILY, Disp: 90 capsule, Rfl: 2   HYDROcodone-acetaminophen (NORCO/VICODIN) 5-325 MG tablet, Take 1-2 tablets by mouth every 6 (six) hours as needed for moderate pain., Disp: 40 tablet, Rfl: 0   insulin aspart (NOVOLOG) 100 UNIT/ML injection, Inject 10 Units into the skin 2 (two) times daily with a meal. Per endocrinologist, Disp: , Rfl:    Insulin Glargine (BASAGLAR KWIKPEN) 100 UNIT/ML, 10 units every morning and 24 units nightly, Disp: , Rfl:    losartan (COZAAR) 100 MG tablet, Take 1 tablet by mouth once daily, Disp: 30 tablet, Rfl: 3   meloxicam  (MOBIC) 15 MG tablet, 1 tablet as needed, Disp: , Rfl:    metFORMIN (GLUCOPHAGE) 1000 MG tablet, TAKE 1 TABLET BY MOUTH TWICE DAILY WITH MEALS, Disp: 180 tablet, Rfl: 0   metoCLOPramide (REGLAN) 5 MG tablet, TAKE 1 TABLET BY MOUTH 4 TIMES DAILY BEFORE MEAL(S) AND AT BEDTIME, Disp: 360 tablet, Rfl: 1   metoprolol succinate (TOPROL-XL) 100 MG 24 hr tablet, Take 2 tablets by mouth once daily, Disp: 180 tablet, Rfl: 1   Multiple Vitamin (MULTIVITAMIN WITH MINERALS) TABS tablet, Take 1 tablet by mouth daily., Disp: , Rfl:    pantoprazole (PROTONIX) 40 MG tablet, TAKE 1 TABLET BY MOUTH ONCE DAILY ., Disp: 90 tablet, Rfl: 1   promethazine (PHENERGAN) 12.5 MG tablet, TAKE 1 TO 2 TABLETS BY MOUTH EVERY 6 HOURS AS NEEDED FOR NAUSEA, Disp: 30 tablet, Rfl: 0   rosuvastatin (CRESTOR) 10 MG tablet, Take 1 tablet (10 mg total) by mouth daily., Disp: 90 tablet, Rfl: 1   verapamil (CALAN-SR) 240 MG CR tablet, TAKE 1 TABLET BY MOUTH AT BEDTIME, Disp: 90 tablet, Rfl: 0   VICTOZA 18 MG/3ML SOPN, INJECT 1.'2MG'$  INTO THE SKIN DAILY (Patient taking differently: 1.8 mg. INJECT 1.'2MG'$  INTO THE SKIN DAILY), Disp: 18 mL, Rfl: 0   Allergies  Allergen Reactions   Gluten Meal Swelling   Lisinopril Cough   Pioglitazone Other (See Comments)    ELEVATED glucoses + worse chronic nausea    Past  Medical History:  Diagnosis Date   Abdominal bloating    likely from diab gastroparesis.  Dr. Havery Moros started trial of reglan 03/2019.   Benign brain tumor ([place])    Cystic lesion in cerebral aqueduct region with mild hydrocephalus-- stable MRI 02/2016.  Surveillance MRI 05/2017 --dilated cerebral aqueduct related to aqueductal stenosis and subsequent mild hydrocephalus (due to the 11 mm stable cystic lesion in cerebral aqueduct---?congenitial?.   Cataract    OU   Dysmenorrhea    vicodin occ during first 2 days of cycle.   Fibromyalgia    Gluten intolerance    pt reports she underwent full GI w/u to r/o celiac dz   Hepatic steatosis     ultrasound 08/2017. Hx of very mild elevation of ALT.  Stable on u/s 06/2020   History of adenomatous polyp of colon 04/08/2019   recall Feb 2024   Hyperlipidemia, mixed    Hypertension    +white coat component   Hypertensive retinopathy of both eyes    Insomnia    Iron deficiency 01/2019   Hb 11.3. Hemoccults neg x 3 03/19/19. EGD and colonoscopy 04/08/19 showed NO cause for IDA.  Pt does have menorrhagia, though, so she'll see her GYN.  started FeSO4 325 qd approx 04/14/19.   Menorrhagia    resulting in IDA 2021   PMR (polymyalgia rheumatica) ([place]) 2022   question of; hx of elevated ESR-->better with prednisone but prednisone was contraindicated d/t eye issues/hyperglycemia-->rheum started following her 02/2021->plaquenil 04/2021   Proliferative diabetic retinopathy of both eyes ([place])    steroid injections 10/2017--improved   Sensorineural hearing loss of left ear    Sudden left hearing loss summer 2016--no improvement with steroids 01/2015 so brain MRI done by Dr. Redmond Baseman and it showed brain tumor that was determined to be benign.  Pt's hearing not bad enough for hearing aid as of 06/2016.   Type 2 diabetes with complication (HCC)    +microalbuminuria, diab retpthy, diabetic gastroparesis (gastric emptying study mildly abnl 03/2017).  Recommended lantus 08/2018 but pt declined. Mild microalbuminuria.   Uterine fibroid 2022   per pt report, pelvic u/s in GYN office     Past Surgical History:  Procedure Laterality Date   ANOSCOPY  05/12/2019   Procedure: normal exam, minimal hemorrhoid disease. Hyertrophied anal papila, benign appearing, posterior midline. Surgeon: Leighton Ruff MD   CHOLECYSTECTOMY  2000   COLONOSCOPY  04/08/2019   5 adenomas, recall 3 yrs; no cause for IDA found.  Hypertrophied anal papillae->bx showed low grade dysplasia; GI referred her to colorectal surgeon.   ESOPHAGOGASTRODUODENOSCOPY  04/08/2019   mild chronic reactive gastritis. H pylori NEG.  No cause for IDA found.    GAS INSERTION Right 10/31/2018   Procedure: Insertion Of C3F8 Gas;  Surgeon: Bernarda Caffey, MD;  Location: [place];  Service: Ophthalmology;  Laterality: Right;   GASTRIC EMPTYING SCAN  04/20/2017   Mildly abnormal, particularly the 1st hour of emptying.   MEMBRANE PEEL Right 10/31/2018   Procedure: MEMBRANE PEEL;  Surgeon: Bernarda Caffey, MD;  Location: [place];  Service: Ophthalmology;  Laterality: Right;   PARS PLANA VITRECTOMY Right 04/12/2018   Procedure: Right PARS PLANA VITRECTOMY WITH 25 GAUGE with intravitreal antibiotics;  Surgeon: Bernarda Caffey, MD;  Location: [place];  Service: Ophthalmology;  Laterality: Right;   PARS PLANA VITRECTOMY Right 10/31/2018   Procedure: PARS PLANA VITRECTOMY WITH 25 GAUGE;  Surgeon: Bernarda Caffey, MD;  Location: [place];  Service: Ophthalmology;  Laterality: Right;   PHOTOCOAGULATION  WITH LASER Right 10/31/2018   Procedure: Photocoagulation With Laser;  Surgeon: Bernarda Caffey, MD;  Location: [place];  Service: Ophthalmology;  Laterality: Right;   TRANSTHORACIC ECHOCARDIOGRAM  08/23/2020   Grd I DD, o/w normal.    Family History  Problem Relation Age of Onset   Brain cancer Mother    Diabetes Father    Diabetes Maternal Grandmother    Cataracts Maternal Grandmother    Cervical cancer Paternal Grandmother    Colon cancer Maternal Grandfather 34   Amblyopia Neg Hx    Blindness Neg Hx    Glaucoma Neg Hx    Macular degeneration Neg Hx    Retinal detachment Neg Hx    Strabismus Neg Hx    Retinitis pigmentosa Neg Hx    Esophageal cancer Neg Hx    Stomach cancer Neg Hx    Rectal cancer Neg Hx     Social History   Tobacco Use   Smoking status: Never   Smokeless tobacco: Never  Vaping Use   Vaping Use: Never used  Substance Use Topics   Alcohol use: No   Drug use: No    ROS   Objective:   Vitals: BP 134/84 (BP Location: Right Arm)   Pulse 79   Temp 98.1 F (36.7 C) (Oral)   Resp 20   LMP 05/07/2021 (Approximate)   SpO2 95%    Physical Exam Constitutional:      General: She is not in acute distress.    Appearance: Normal appearance. She is well-developed. She is not ill-appearing, toxic-appearing or diaphoretic.  HENT:     Head: Normocephalic and atraumatic.     Nose: Nose normal.     Mouth/Throat:     Mouth: Mucous membranes are moist.  Eyes:     General: No scleral icterus.       Right eye: No discharge.        Left eye: No discharge.     Extraocular Movements: Extraocular movements intact.  Cardiovascular:     Rate and Rhythm: Normal rate.  Pulmonary:     Effort: Pulmonary effort is normal.  Musculoskeletal:     Left ankle: Swelling present. No deformity, ecchymosis or lacerations. Tenderness present over the lateral malleolus, medial malleolus and base of 5th metatarsal. No ATF ligament, AITF ligament, CF ligament, posterior TF ligament or proximal fibula tenderness. Decreased range of motion.     Left Achilles Tendon: No tenderness or defects. Thompson's test negative.  Skin:    General: Skin is warm and dry.  Neurological:     General: No focal deficit present.     Mental Status: She is alert and oriented to person, place, and time.  Psychiatric:        Mood and Affect: Mood normal.        Behavior: Behavior normal.     DG Ankle Complete Left  Result Date: 05/08/2022 CLINICAL DATA:  Fall, ankle pain EXAM: LEFT ANKLE COMPLETE - 3+ VIEW COMPARISON:  None Available. FINDINGS: Normal alignment without acute osseous finding or fracture. Malleoli, talus and calcaneus intact. No joint abnormality. Diffuse lower extremity, ankle and foot edema noted. IMPRESSION: Diffuse soft tissue swelling. No acute osseous finding. Electronically Signed   By: Jerilynn Mages.  Shick M.D.   On: 05/08/2022 12:00    Left ankle wrapped using 4" Ace wrap in figure-8 method.  Assessment and Plan :   PDMP not reviewed this encounter.  1. Sprain of left ankle, unspecified ligament, initial encounter  Will manage for ankle  sprain with rice method, NSAID. Counseled patient on potential for adverse effects with medications prescribed/recommended today, ER and return-to-clinic precautions discussed, patient verbalized understanding.    Jaynee Eagles, [place] 05/08/22 1207

## 2022-05-08 NOTE — ED Triage Notes (Signed)
Pt fell while walking on ramp yesterday fell on right side.  C/o left ankle and top of foot pain.

## 2022-05-11 ENCOUNTER — Other Ambulatory Visit: Payer: Self-pay | Admitting: Family Medicine

## 2022-05-23 ENCOUNTER — Other Ambulatory Visit: Payer: Self-pay | Admitting: Family Medicine

## 2022-05-23 DIAGNOSIS — I10 Essential (primary) hypertension: Secondary | ICD-10-CM

## 2022-05-23 DIAGNOSIS — E782 Mixed hyperlipidemia: Secondary | ICD-10-CM

## 2022-05-30 NOTE — Progress Notes (Signed)
[facility] - Clinic Note  06/05/2022     CHIEF COMPLAINT Patient presents for Retina Follow Up  HISTORY OF PRESENT ILLNESS: Gloria Gomez is a 52 y.o. female who presents to the clinic today for:  HPI     Retina Follow Up   Patient presents with  Diabetic Retinopathy.  In both eyes.  This started 4 weeks ago.  I, the attending physician,  performed the HPI with the patient and updated documentation appropriately.        Comments   Patient here for 4 weeks retina follow up for PDR OU. Patient states vision about the same. Some days OS is a little bit foggy.      Last edited by Rennis Chris, MD on 06/05/2022  3:49 PM.     Referring physician:   HISTORICAL INFORMATION:   Selected notes from the MEDICAL RECORD NUMBER Referred for DM exam   CURRENT MEDICATIONS: No current outpatient medications on file. (Ophthalmic Drugs)   No current facility-administered medications for this visit. (Ophthalmic Drugs)   Current Outpatient Medications (Other)  Medication Sig   amLODipine (NORVASC) 5 MG tablet Take 1 tablet (5 mg total) by mouth daily.   Continuous Blood Gluc Sensor (FREESTYLE LIBRE 3 SENSOR) MISC by Does not apply route every 14 (fourteen) days.   FARXIGA 10 MG TABS tablet Take 10 mg by mouth daily.   furosemide (LASIX) 20 MG tablet TAKE 2 TABLETS BY MOUTH IN THE MORNING AND 1 IN THE EVENING   gabapentin (NEURONTIN) 300 MG capsule TAKE 1 CAPSULE BY MOUTH THREE TIMES DAILY   HUMALOG KWIKPEN 100 UNIT/ML KwikPen Inject into the skin.   HYDROcodone-acetaminophen (NORCO/VICODIN) 5-325 MG tablet Take 1-2 tablets by mouth every 6 (six) hours as needed for moderate pain.   hydroxychloroquine (PLAQUENIL) 200 MG tablet    insulin aspart (NOVOLOG) 100 UNIT/ML injection Inject 10 Units into the skin 2 (two) times daily with a meal. Per endocrinologist   Insulin Glargine (BASAGLAR KWIKPEN) 100 UNIT/ML 10 units every morning and 24 units nightly   losartan  (COZAAR) 100 MG tablet Take 1 tablet by mouth once daily   meloxicam (MOBIC) 15 MG tablet Take 1 tablet daily as needed.   metFORMIN (GLUCOPHAGE) 1000 MG tablet TAKE 1 TABLET BY MOUTH TWICE DAILY WITH MEALS   metoCLOPramide (REGLAN) 5 MG tablet TAKE 1 TABLET BY MOUTH 4 TIMES DAILY BEFORE MEAL(S) AND AT BEDTIME   metoprolol succinate (TOPROL-XL) 100 MG 24 hr tablet Take 2 tablets by mouth once daily   Multiple Vitamin (MULTIVITAMIN WITH MINERALS) TABS tablet Take 1 tablet by mouth daily.   NOVOLOG FLEXPEN 100 UNIT/ML FlexPen    pantoprazole (PROTONIX) 40 MG tablet TAKE 1 TABLET BY MOUTH ONCE DAILY .   promethazine (PHENERGAN) 12.5 MG tablet TAKE 1 TO 2 TABLETS BY MOUTH EVERY 6 HOURS AS NEEDED FOR NAUSEA   rosuvastatin (CRESTOR) 10 MG tablet Take 1 tablet (10 mg total) by mouth daily.   verapamil (CALAN-SR) 240 MG CR tablet TAKE 1 TABLET BY MOUTH AT BEDTIME   verapamil (CALAN-SR) 240 MG CR tablet Take by mouth.   VICTOZA 18 MG/3ML SOPN INJECT 1.2MG  INTO THE SKIN DAILY (Patient taking differently: 1.8 mg. INJECT 1.2MG  INTO THE SKIN DAILY)   No current facility-administered medications for this visit. (Other)   REVIEW OF SYSTEMS: ROS   Positive for: Endocrine, Eyes Negative for: Constitutional, Gastrointestinal, Neurological, Skin, Genitourinary, Musculoskeletal, HENT, Cardiovascular, Respiratory, Psychiatric, Allergic/Imm, Heme/Lymph Last edited by Betsey Holiday  S, COA on 06/05/2022  3:12 PM.     ALLERGIES Allergies  Allergen Reactions   Gluten Meal Swelling   Lisinopril Cough   Pioglitazone Other (See Comments)    ELEVATED glucoses + worse chronic nausea   PAST MEDICAL HISTORY Past Medical History:  Diagnosis Date   Abdominal bloating    likely from diab gastroparesis.  Dr. Adela Lank started trial of reglan 03/2019.   Benign brain tumor    Cystic lesion in cerebral aqueduct region with mild hydrocephalus-- stable MRI 02/2016.  Surveillance MRI 05/2017 --dilated cerebral aqueduct  related to aqueductal stenosis and subsequent mild hydrocephalus (due to the 11 mm stable cystic lesion in cerebral aqueduct---?congenitial?.   Cataract    OU   Dysmenorrhea    vicodin occ during first 2 days of cycle.   Fibromyalgia    Gluten intolerance    pt reports she underwent full GI w/u to r/o celiac dz   Hepatic steatosis    ultrasound 08/2017. Hx of very mild elevation of ALT.  Stable on u/s 06/2020   History of adenomatous polyp of colon 04/08/2019   recall Feb 2024   Hyperlipidemia, mixed    Hypertension    +white coat component   Hypertensive retinopathy of both eyes    Insomnia    Iron deficiency 01/2019   Hb 11.3. Hemoccults neg x 3 03/19/19. EGD and colonoscopy 04/08/19 showed NO cause for IDA.  Pt does have menorrhagia, though, so she'll see her GYN.  started FeSO4 325 qd approx 04/14/19.   Menorrhagia    resulting in IDA 2021   PMR (polymyalgia rheumatica) 2022   question of; hx of elevated ESR-->better with prednisone but prednisone was contraindicated d/t eye issues/hyperglycemia-->rheum started following her 02/2021->plaquenil 04/2021   Proliferative diabetic retinopathy of both eyes    steroid injections 10/2017--improved   Sensorineural hearing loss of left ear    Sudden left hearing loss summer 2016--no improvement with steroids 01/2015 so brain MRI done by Dr. Jenne Pane and it showed brain tumor that was determined to be benign.  Pt's hearing not bad enough for hearing aid as of 06/2016.   Type 2 diabetes with complication    +microalbuminuria, diab retpthy, diabetic gastroparesis (gastric emptying study mildly abnl 03/2017).  Recommended lantus 08/2018 but pt declined. Mild microalbuminuria.   Uterine fibroid 2022   per pt report, pelvic u/s in GYN office   Past Surgical History:  Procedure Laterality Date   ANOSCOPY  05/12/2019   Procedure: normal exam, minimal hemorrhoid disease. Hyertrophied anal papila, benign appearing, posterior midline. Surgeon: Romie Levee MD    CHOLECYSTECTOMY  2000   COLONOSCOPY  04/08/2019   5 adenomas, recall 3 yrs; no cause for IDA found.  Hypertrophied anal papillae->bx showed low grade dysplasia; GI referred her to colorectal surgeon.   ESOPHAGOGASTRODUODENOSCOPY  04/08/2019   mild chronic reactive gastritis. H pylori NEG.  No cause for IDA found.   GAS INSERTION Right 10/31/2018   Procedure: Insertion Of C3F8 Gas;  Surgeon: Rennis Chris, MD;  Location: [facility] OR;  Service: Ophthalmology;  Laterality: Right;   GASTRIC EMPTYING SCAN  04/20/2017   Mildly abnormal, particularly the 1st hour of emptying.   MEMBRANE PEEL Right 10/31/2018   Procedure: MEMBRANE PEEL;  Surgeon: Rennis Chris, MD;  Location: [facility] OR;  Service: Ophthalmology;  Laterality: Right;   PARS PLANA VITRECTOMY Right 04/12/2018   Procedure: Right PARS PLANA VITRECTOMY WITH 25 GAUGE with intravitreal antibiotics;  Surgeon: Rennis Chris, MD;  Location: [facility] OR;  Service: Ophthalmology;  Laterality: Right;   PARS PLANA VITRECTOMY Right 10/31/2018   Procedure: PARS PLANA VITRECTOMY WITH 25 GAUGE;  Surgeon: Rennis ChrisZamora, Keliah Harned, MD;  [facility];  Service: Ophthalmology;  Laterality: Right;   PHOTOCOAGULATION WITH LASER Right 10/31/2018   Procedure: Photocoagulation With Laser;  Surgeon: Rennis ChrisZamora, Yelitza Reach, MD;  [facility];  Service: Ophthalmology;  Laterality: Right;   TRANSTHORACIC ECHOCARDIOGRAM  08/23/2020   Grd I DD, o/w normal.   FAMILY HISTORY Family History  Problem Relation Age of Onset   Brain cancer Mother    Diabetes Father    Diabetes Maternal Grandmother    Cataracts Maternal Grandmother    Cervical cancer Paternal Grandmother    Colon cancer Maternal Grandfather [id number]   Amblyopia Neg Hx    Blindness Neg Hx    Glaucoma Neg Hx    Macular degeneration Neg Hx    Retinal detachment Neg Hx    Strabismus Neg Hx    Retinitis pigmentosa Neg Hx    Esophageal cancer Neg Hx    Stomach cancer Neg Hx    Rectal cancer Neg Hx    SOCIAL HISTORY Social History    Tobacco Use   Smoking status: Never   Smokeless tobacco: Never  Vaping Use   Vaping Use: Never used  Substance Use Topics   Alcohol use: No   Drug use: No       OPHTHALMIC EXAM:  Base Eye Exam     Visual Acuity (Snellen - Linear)       Right Left   Dist [place] 20/25 -1    Dist cc  20/25    Correction: Glasses         Tonometry (Tonopen, 3:09 PM)       Right Left   Pressure 17 16         Pupils       Dark Light Shape React APD   Right 3 2 Round Brisk None   Left 3 2 Round Brisk None         Visual Fields (Counting fingers)       Left Right    Full Full         Extraocular Movement       Right Left    Full, Ortho Full, Ortho         Neuro/Psych     Oriented x3: Yes   Mood/Affect: Normal         Dilation     Both eyes: 1.0% Mydriacyl, 2.5% Phenylephrine @ 3:09 PM           Slit Lamp and Fundus Exam     Slit Lamp Exam       Right Left   Lids/Lashes Dermatochalasis - upper lid, mild Meibomian gland dysfunction, Telangiectasia Dermatochalasis - upper lid, Meibomian gland dysfunction, Telangiectasia   Conjunctiva/Sclera White and quiet White and quiet   Cornea Clear, well healed temporal cataract wounds Trace Punctate epithelial erosions, trace EBMD   Anterior Chamber Deep and quiet Deep and quiet   Iris Round and dilated, No NVI Round and dilated, No NVI   Lens PC IOL in good position, 1-2+ Posterior capsular opacification, PC folds 2-3+ Nuclear sclerosis with mild brunescence, 2-3+ Cortical cataract, 1+ Posterior subcapsular cataract   Anterior Vitreous post vitrectomy, trace pigment Vitreous syneresis, mild vitreous condensations, no heme, old white VH settled inferiorly         Fundus Exam       Right Left  Disc mild Pallor, Sharp rim, temporal PPA Pink and sharp, Compact, PPA   C/D Ratio 0.2 0.0   Macula Flat, good foveal reflex, mild cystic changes temporal macula -- slightly improved, focal MA and exudates ST mac,  +focal laser changes Flat, good foveal reflex, trace cystic changes -- stably improved, scattered MA; trace ERM, light focal laser changes, no edema   Vessels attenuated, Tortuous attenuated, Tortuous   Periphery Attached, rare MA, scattered DBH greatest posteriorly, 360 PRP, good laser fill in 360 attached, scattered IRH; 360 PRP scars -- with good fill in changes           Refraction     Wearing Rx       Sphere Cylinder   Right -3.75 Sphere   Left -3.75 Sphere           IMAGING AND PROCEDURES  Imaging and Procedures for   OCT, Retina - OU - Both Eyes       Right Eye Quality was good. Central Foveal Thickness: 346. Progression has improved. Findings include no SRF, abnormal foveal contour, intraretinal hyper-reflective material, epiretinal membrane, intraretinal fluid (interval improvement in IRF/cystic changes temporal macula and fovea ).   Left Eye Quality was good. Central Foveal Thickness: 279. Progression has been stable. Findings include normal foveal contour, no IRF, no SRF, intraretinal hyper-reflective material (Stable improvement in IRF/cystic changes temporal fovea, stable improvement in vitreous opacities).   Notes *Images captured and stored on drive  Diagnosis / Impression:  DME OU OD: interval improvement in IRF/cystic changes temporal macula and fovea  OS: stable improvement in IRF/cystic changes temporal fovea, stable improvement in vitreous opacities  Clinical management:  See below  Abbreviations: NFP - Normal foveal profile. CME - cystoid macular edema. PED - pigment epithelial detachment. IRF - intraretinal fluid. SRF - subretinal fluid. EZ - ellipsoid zone. ERM - epiretinal membrane. ORA - outer retinal atrophy. ORT - outer retinal tubulation. SRHM - subretinal hyper-reflective material       Intravitreal Injection, Pharmacologic Agent - OD - Right Eye       Time Out 06/05/2022. 3:24 PM. Confirmed correct patient, procedure, site, and  patient consented.   Anesthesia Topical anesthesia was used. Anesthetic medications included Lidocaine 2%, Proparacaine 0.5%.   Procedure Preparation included 5% betadine to ocular surface, eyelid speculum. A (32g) needle was used.   Injection: 6 mg faricimab-svoa 6 MG/0.05ML   Route: Intravitreal, Site: Right Eye   NDC: [id number], Lot: [id number], Expiration date: 03/28/2024, Waste: 0 mL   Post-op Post injection exam found visual acuity of at least counting fingers. The patient tolerated the procedure well. There were no complications. The patient received written and verbal post procedure care education. Post injection medications were not given.            ASSESSMENT/PLAN:   ICD-10-CM   1. Proliferative diabetic retinopathy of both eyes with macular edema associated with type 2 diabetes mellitus  E11.3513 OCT, Retina - OU - Both Eyes    Intravitreal Injection, Pharmacologic Agent - OD - Right Eye    faricimab-svoa (VABYSMO) 6mg /0.3405mL intravitreal injection    2. Vitreous hemorrhage of left eye  H43.12     3. Vitreous hemorrhage, right eye  H43.11     4. Essential hypertension  I10     5. Right endophthalmia  H44.001     6. Hypertensive retinopathy of both eyes  H35.033     7. Combined forms of age-related cataract of left eye  H25.812  8. Pseudophakia of right eye  Z96.1       1.  Proliferative diabetic retinopathy w/ DME, OU  - HbA1c 7.1% (3.23.23), 8.6% (9.14.22), 7.8% (06.13.22), 9.1% (02.24.22) 11.0% (11.22.21) - s/p IVA OD #1 9.20.19, #2 (10.25.19), #3 (11.15.19), #4 (12.17.19), #5 (01.14.20), #6 (2.11.20), #7 (05.29.20), #8 (08.19.20), #9 (10.30.20), #10 (12.09.20), #11 (01.11.21), #12 (02.15.21), #13 (03.23.21), #14 (04.20.21), #15 (06.08.21), #16 (07.06.21) -- IVA resistance - s/p IVA OS #1 9.27.19, #2 (10.25.19), #3 (11.15.19), #4 (12.17.19), #5 (01.14.20), #6 (2.11.20), #7 (04.26.20), #8 (05.29.20), #9 (06.26.20), #10 (08.05.20), #11 (11.11.20), #12  (12.09.20), #13 (01.11.21), #14 (02.15.21), #15 (03.23.21), #16 (04.20.21), #17 (06.08.21) -- IVA resistance, #18 (09.27.21) - s/p IVE OD #1 (08.03.21) -- sample, #2 (09.10.21), #3 (10.08.21), #4 (11.23.21), #5 (12.21.21), #6 (01.19.22) sample, #7 (2.16.22), #8 (3.22.22), #9 (04.19.22), #10 (05.27.22), #11 (06.29.22), #12 (08.03.22), #13 (09.07.22), #14 (10.10.22), #15 (11.14.22), #16 (12.12.22), #17 (01.10.23) - sample, #18 (02.07.23), #19 (03.21.23), #20 (04.18.23), #21 (05.16.23) -- IVE resistance  - s/p IVE OS #1 (10.25.21), #2 (11.23.21), #3 (12.21.21), #4 (3.22.22), #5 (06.29.22), #6 (08.03.22), #7 (09.07.22), #8 (10.10.22), #9 (11.14.22), #10 (02.07.23), #11 (05.16.23), #12 (08.15.23), #13 (11.14.23), #14 (02.06.24)  - IVV OD #1 (06.16.23 -- sample), #2 (07.17.23), #3 (08.15.23), #4 (09.12.23), #5 (10.10.23), #6 (11.14.23), #7 (12.12.23), #8 (01.09.23), #9 (02.06.24), #10 (03.11.24)  - S/P PRP OS (09.20.19), (5.19.20), (08.19.20), (04.28.21), (02.02.22)  - S/P PRP OD (9.27.19 and 11.21.19), fill-in (04.14.20) (09.03.20, surgery)  - S/P focal laser OS (07.06.21), OD (09.20.22)  - FA (9.20.19) shows +NVE OU and leaking MA and capillary nonperfusion  - repeat FA 11.15.19 shows NV regressing OU  - pre-op: OD w/ VA stable at 20/25, but there is some preretinal fibrosis / tractional membranes just superior to disc and mild central DME  - s/p 25g PPV+MP+10% C3F8 gas OD (09.03.20) -- ERM/PRF removal OD  - BCVA OD: 20/25 -- stable, OS: 20/25 -- slighlty improved             - fibrosis/ERM stably improved; retina attached  - OCT shows OD: interval improvement in IRF/cystic changes temporal macula and fovea; OS: stable improvement in IRF/cystic changes temporal fovea, stable improvement in vitreous opacities at 4 wks  - recommend IVV OD #11 today 04.08.24 with follow up in 4 weeks; will hold treatment in OS  - new health insurance for 2024 -- authorizations for Vabysmo and Eylea approved  - **OS on ~q32m  maintenance treatment schedule**  - pt in agreement  - RBA of procedure discussed, questions answered - see procedure note  - Eylea informed consent form re-signed and scanned on 05.16.2023  - Vabysmo informed consent form signed on 06.16.23  - f/u 4 weeks, DFE, OCT, possible injection(s)  2. Vitreous hemorrhage OS -- stably improved  - recurrent VH, onset 09.22.21  - etiology: secondary to PDR as described above (no RT/RD on exam)  - s/p PRP OS (9.20.19), (05.19.20), (08.19.20), (04.28.21), (02.02.22)  - s/p IVA OS on 4.26.20, 5.29.20, 6.26.20, 8.5.20,11.11.20, 12.06.20 and so on as above   - VH clear centrally and settled inferiorly -- now white  - BCVA stable at 20/25  - f/u 4 weeks DFE, OCT  3. History of Endophthalmitis OD  - s/p IVA OU 04/09/2018  - s/p 25g PPV w/ intravitreal vanc, ceftaz and cefepime OD, 2.14.2020  - s/p intravitreal tap / vanc and ceflaz injections (02.16.20)             - gram stain (2.14.20)  shows G+ cocci, WBCs mostly PMNs;   - repeat gram stain from t/i (2.16.20) -- no organisms, just WBCs             - cultures from vitreous grew rare Staph warneri; cultures from t/i -- no growth             - doing well, BCVA 20/30             - inflammation/posterior debris resolved  - monitor  4. History of Vitreous Hemorrhage OD -- cleared from PPV x2 for endophthalmitis and ERM/preretinal fibrosis   - secondary to PDR  5,6. Hypertensive retinopathy OU  - discussed importance of tight BP control.  - monitor  7. Combined form age related cataract OS  - The symptoms of cataract, surgical options, and treatments and risks were discussed with patient.   - discussed diagnosis and progression  8. Pseudophakia OD  - s/p CE/IOL OD (Dr. Laruth Bouchard, 12.11.20)  - beautiful surgeries, doing well  Ophthalmic Meds Ordered this visit:  Meds ordered this encounter  Medications   faricimab-svoa (VABYSMO) 6mg /0.32mL intravitreal injection     Return in about 4 weeks  (around 07/03/2022) for DFE, OCT.  There are no Patient Instructions on file for this visit.  This document serves as a record of services personally performed by Karie Chimera, MD, PhD. It was created on their behalf by De Blanch, an ophthalmic technician. The creation of this record is the provider's dictation and/or activities during the visit.    Electronically signed by: De Blanch, OA, 06/05/22  3:58 PM  Karie Chimera, M.D., Ph.D. Diseases & Surgery of the Retina and Vitreous [facility]  I have reviewed the above documentation for accuracy and completeness, and I agree with the above. Karie Chimera, M.D., Ph.D. 06/05/22 4:01 PM   Abbreviations: M myopia (nearsighted); A astigmatism; H hyperopia (farsighted); P presbyopia; Mrx spectacle prescription;  CTL contact lenses; OD right eye; OS left eye; OU both eyes  XT exotropia; ET esotropia; PEK punctate epithelial keratitis; PEE punctate epithelial erosions; DES dry eye syndrome; MGD meibomian gland dysfunction; ATs artificial tears; PFAT's preservative free artificial tears; NSC nuclear sclerotic cataract; PSC posterior subcapsular cataract; ERM epi-retinal membrane; PVD posterior vitreous detachment; RD retinal detachment; DM diabetes mellitus; DR diabetic retinopathy; NPDR non-proliferative diabetic retinopathy; PDR proliferative diabetic retinopathy; CSME clinically significant macular edema; DME diabetic macular edema; dbh dot blot hemorrhages; CWS cotton wool spot; POAG primary open angle glaucoma; C/D cup-to-disc ratio; HVF humphrey visual field; GVF goldmann visual field; OCT optical coherence tomography; IOP intraocular pressure; BRVO Branch retinal vein occlusion; CRVO central retinal vein occlusion; CRAO central retinal artery occlusion; BRAO branch retinal artery occlusion; RT retinal tear; SB scleral buckle; PPV pars plana vitrectomy; VH Vitreous hemorrhage; PRP panretinal laser  photocoagulation; IVK intravitreal kenalog; VMT vitreomacular traction; MH Macular hole;  NVD neovascularization of the disc; NVE neovascularization elsewhere; AREDS age related eye disease study; ARMD age related macular degeneration; POAG primary open angle glaucoma; EBMD epithelial/anterior basement membrane dystrophy; ACIOL anterior chamber intraocular lens; IOL intraocular lens; PCIOL posterior chamber intraocular lens; Phaco/IOL phacoemulsification with intraocular lens placement; PRK photorefractive keratectomy; LASIK laser assisted in situ keratomileusis; HTN hypertension; DM diabetes mellitus; COPD chronic obstructive pulmonary disease

## 2022-06-05 ENCOUNTER — Ambulatory Visit (INDEPENDENT_AMBULATORY_CARE_PROVIDER_SITE_OTHER): Payer: Commercial Managed Care - HMO | Admitting: Ophthalmology

## 2022-06-05 ENCOUNTER — Encounter (INDEPENDENT_AMBULATORY_CARE_PROVIDER_SITE_OTHER): Payer: Self-pay | Admitting: Ophthalmology

## 2022-06-05 DIAGNOSIS — Z961 Presence of intraocular lens: Secondary | ICD-10-CM

## 2022-06-05 DIAGNOSIS — H4312 Vitreous hemorrhage, left eye: Secondary | ICD-10-CM

## 2022-06-05 DIAGNOSIS — H44001 Unspecified purulent endophthalmitis, right eye: Secondary | ICD-10-CM

## 2022-06-05 DIAGNOSIS — E113513 Type 2 diabetes mellitus with proliferative diabetic retinopathy with macular edema, bilateral: Secondary | ICD-10-CM | POA: Diagnosis not present

## 2022-06-05 DIAGNOSIS — H4313 Vitreous hemorrhage, bilateral: Secondary | ICD-10-CM

## 2022-06-05 DIAGNOSIS — I10 Essential (primary) hypertension: Secondary | ICD-10-CM | POA: Diagnosis not present

## 2022-06-05 DIAGNOSIS — H25812 Combined forms of age-related cataract, left eye: Secondary | ICD-10-CM

## 2022-06-05 DIAGNOSIS — H4311 Vitreous hemorrhage, right eye: Secondary | ICD-10-CM

## 2022-06-05 DIAGNOSIS — H35033 Hypertensive retinopathy, bilateral: Secondary | ICD-10-CM

## 2022-06-05 MED ORDER — FARICIMAB-SVOA 6 MG/0.05ML IZ SOLN
6.0000 mg | INTRAVITREAL | Status: AC | PRN
  Administered 2022-06-05: 6 mg via INTRAVITREAL

## 2022-06-27 NOTE — Progress Notes (Signed)
[facility] - Clinic Note  07/03/2022     CHIEF COMPLAINT Patient presents for Retina Follow Up  HISTORY OF PRESENT ILLNESS: Gloria Gomez is a 52 y.o. female who presents to the clinic today for:  HPI     Retina Follow Up   Patient presents with  Diabetic Retinopathy.  In both eyes.  This started 4 weeks ago.  Duration of 4 weeks.  Since onset it is stable.  I, the attending physician,  performed the HPI with the patient and updated documentation appropriately.        Comments   4 week retina follow up PDR OU and IVV OD IVV OS Q3M  pt is reporting no vision changes noticed she denies any flashes or floaters       Last edited by Rennis Chris, MD on 07/03/2022  4:55 PM.    Pt states vision is stable, she states she had an episode of orthostatic hypotension a few days ago, she fell and hit her head on a door, she did not go to the ED, she did not have any vision changes, she had a headache for a few days, but it went away  Referring physician:   HISTORICAL INFORMATION:   Selected notes from the MEDICAL RECORD NUMBER Referred for DM exam   CURRENT MEDICATIONS: No current outpatient medications on file. (Ophthalmic Drugs)   No current facility-administered medications for this visit. (Ophthalmic Drugs)   Current Outpatient Medications (Other)  Medication Sig   amLODipine (NORVASC) 5 MG tablet Take 1 tablet (5 mg total) by mouth daily.   Continuous Blood Gluc Sensor (FREESTYLE LIBRE 3 SENSOR) MISC by Does not apply route every 14 (fourteen) days.   FARXIGA 10 MG TABS tablet Take 10 mg by mouth daily.   furosemide (LASIX) 20 MG tablet TAKE 2 TABLETS BY MOUTH IN THE MORNING AND 1 IN THE EVENING   gabapentin (NEURONTIN) 300 MG capsule TAKE 1 CAPSULE BY MOUTH THREE TIMES DAILY   HUMALOG KWIKPEN 100 UNIT/ML KwikPen Inject into the skin.   HYDROcodone-acetaminophen (NORCO/VICODIN) 5-325 MG tablet Take 1-2 tablets by mouth every 6 (six) hours as needed for  moderate pain.   hydroxychloroquine (PLAQUENIL) 200 MG tablet    insulin aspart (NOVOLOG) 100 UNIT/ML injection Inject 10 Units into the skin 2 (two) times daily with a meal. Per endocrinologist   Insulin Glargine (BASAGLAR KWIKPEN) 100 UNIT/ML 10 units every morning and 24 units nightly   losartan (COZAAR) 100 MG tablet Take 1 tablet by mouth once daily   meloxicam (MOBIC) 15 MG tablet Take 1 tablet daily as needed.   metFORMIN (GLUCOPHAGE) 1000 MG tablet TAKE 1 TABLET BY MOUTH TWICE DAILY WITH MEALS   metoCLOPramide (REGLAN) 5 MG tablet TAKE 1 TABLET BY MOUTH 4 TIMES DAILY BEFORE MEAL(S) AND AT BEDTIME   metoprolol succinate (TOPROL-XL) 100 MG 24 hr tablet Take 2 tablets by mouth once daily   Multiple Vitamin (MULTIVITAMIN WITH MINERALS) TABS tablet Take 1 tablet by mouth daily.   NOVOLOG FLEXPEN 100 UNIT/ML FlexPen    pantoprazole (PROTONIX) 40 MG tablet TAKE 1 TABLET BY MOUTH ONCE DAILY .   promethazine (PHENERGAN) 12.5 MG tablet TAKE 1 TO 2 TABLETS BY MOUTH EVERY 6 HOURS AS NEEDED FOR NAUSEA   rosuvastatin (CRESTOR) 10 MG tablet Take 1 tablet (10 mg total) by mouth daily.   verapamil (CALAN-SR) 240 MG CR tablet TAKE 1 TABLET BY MOUTH AT BEDTIME   verapamil (CALAN-SR) 240 MG CR tablet  Take by mouth.   VICTOZA 18 MG/3ML SOPN INJECT 1.2MG  INTO THE SKIN DAILY (Patient taking differently: 1.8 mg. INJECT 1.2MG  INTO THE SKIN DAILY)   No current facility-administered medications for this visit. (Other)   REVIEW OF SYSTEMS: ROS   Positive for: Endocrine, Eyes Negative for: Constitutional, Gastrointestinal, Neurological, Skin, Genitourinary, Musculoskeletal, HENT, Cardiovascular, Respiratory, Psychiatric, Allergic/Imm, Heme/Lymph Last edited by Etheleen Mayhew, COT on 07/03/2022  1:45 PM.      ALLERGIES Allergies  Allergen Reactions   Gluten Meal Swelling   Lisinopril Cough   Pioglitazone Other (See Comments)    ELEVATED glucoses + worse chronic nausea   PAST MEDICAL HISTORY Past  Medical History:  Diagnosis Date   Abdominal bloating    likely from diab gastroparesis.  Dr. Adela Lank started trial of reglan 03/2019.   Benign brain tumor (HCC)    Cystic lesion in cerebral aqueduct region with mild hydrocephalus-- stable MRI 02/2016.  Surveillance MRI 05/2017 --dilated cerebral aqueduct related to aqueductal stenosis and subsequent mild hydrocephalus (due to the 11 mm stable cystic lesion in cerebral aqueduct---?congenitial?.   Cataract    OU   Dysmenorrhea    vicodin occ during first 2 days of cycle.   Fibromyalgia    Gluten intolerance    pt reports she underwent full GI w/u to r/o celiac dz   Hepatic steatosis    ultrasound 08/2017. Hx of very mild elevation of ALT.  Stable on u/s 06/2020   History of adenomatous polyp of colon 04/08/2019   recall Feb 2024   Hyperlipidemia, mixed    Hypertension    +white coat component   Hypertensive retinopathy of both eyes    Insomnia    Iron deficiency 01/2019   Hb 11.3. Hemoccults neg x 3 03/19/19. EGD and colonoscopy 04/08/19 showed NO cause for IDA.  Pt does have menorrhagia, though, so she'll see her GYN.  started FeSO4 325 qd approx 04/14/19.   Menorrhagia    resulting in IDA 2021   PMR (polymyalgia rheumatica) (HCC) 2022   question of; hx of elevated ESR-->better with prednisone but prednisone was contraindicated d/t eye issues/hyperglycemia-->rheum started following her 02/2021->plaquenil 04/2021   Proliferative diabetic retinopathy of both eyes (HCC)    steroid injections 10/2017--improved   Sensorineural hearing loss of left ear    Sudden left hearing loss summer 2016--no improvement with steroids 01/2015 so brain MRI done by Dr. Jenne Pane and it showed brain tumor that was determined to be benign.  Pt's hearing not bad enough for hearing aid as of 06/2016.   Type 2 diabetes with complication (HCC)    +microalbuminuria, diab retpthy, diabetic gastroparesis (gastric emptying study mildly abnl 03/2017).  Recommended lantus 08/2018  but pt declined. Mild microalbuminuria.   Uterine fibroid 2022   per pt report, pelvic u/s in GYN office   Past Surgical History:  Procedure Laterality Date   ANOSCOPY  05/12/2019   Procedure: normal exam, minimal hemorrhoid disease. Hyertrophied anal papila, benign appearing, posterior midline. Surgeon: Romie Levee MD   CHOLECYSTECTOMY  2000   COLONOSCOPY  04/08/2019   5 adenomas, recall 3 yrs; no cause for IDA found.  Hypertrophied anal papillae->bx showed low grade dysplasia; GI referred her to colorectal surgeon.   ESOPHAGOGASTRODUODENOSCOPY  04/08/2019   mild chronic reactive gastritis. H pylori NEG.  No cause for IDA found.   GAS INSERTION Right 10/31/2018   Procedure: Insertion Of C3F8 Gas;  Surgeon: Rennis Chris, MD;  Location: [facility] OR;  Service: Ophthalmology;  Laterality: Right;  GASTRIC EMPTYING SCAN  04/20/2017   Mildly abnormal, particularly the 1st hour of emptying.   MEMBRANE PEEL Right 10/31/2018   Procedure: MEMBRANE PEEL;  Surgeon: Rennis Chris, MD;  Location: [facility] OR;  Service: Ophthalmology;  Laterality: Right;   PARS PLANA VITRECTOMY Right 04/12/2018   Procedure: Right PARS PLANA VITRECTOMY WITH 25 GAUGE with intravitreal antibiotics;  Surgeon: Rennis Chris, MD;  Location: [facility] OR;  Service: Ophthalmology;  Laterality: Right;   PARS PLANA VITRECTOMY Right 10/31/2018   Procedure: PARS PLANA VITRECTOMY WITH 25 GAUGE;  Surgeon: Rennis Chris, MD;  Location: [facility] OR;  Service: Ophthalmology;  Laterality: Right;   PHOTOCOAGULATION WITH LASER Right 10/31/2018   Procedure: Photocoagulation With Laser;  Surgeon: Rennis Chris, MD;  Location: [facility] OR;  Service: Ophthalmology;  Laterality: Right;   TRANSTHORACIC ECHOCARDIOGRAM  08/23/2020   Grd I DD, o/w normal.   FAMILY HISTORY Family History  Problem Relation Age of Onset   Brain cancer Mother    Diabetes Father    Diabetes Maternal Grandmother    Cataracts Maternal Grandmother    Cervical cancer Paternal Grandmother     Colon cancer Maternal Grandfather 46   Amblyopia Neg Hx    Blindness Neg Hx    Glaucoma Neg Hx    Macular degeneration Neg Hx    Retinal detachment Neg Hx    Strabismus Neg Hx    Retinitis pigmentosa Neg Hx    Esophageal cancer Neg Hx    Stomach cancer Neg Hx    Rectal cancer Neg Hx    SOCIAL HISTORY Social History   Tobacco Use   Smoking status: Never   Smokeless tobacco: Never  Vaping Use   Vaping Use: Never used  Substance Use Topics   Alcohol use: No   Drug use: No       OPHTHALMIC EXAM:  Base Eye Exam     Visual Acuity (Snellen - Linear)       Right Left   Dist [place] 20/25 -2    Dist cc  20/25 -2         Tonometry (Tonopen, 1:53 PM)       Right Left   Pressure 14 16         Pupils       Pupils Dark Light Shape React APD   Right PERRL 3 2 Round Brisk None   Left PERRL 3 2 Round Brisk None         Visual Fields       Left Right    Full Full         Extraocular Movement       Right Left    Full, Ortho Full, Ortho         Neuro/Psych     Oriented x3: Yes   Mood/Affect: Normal         Dilation     Both eyes: 2.5% Phenylephrine @ 1:51 PM           Slit Lamp and Fundus Exam     Slit Lamp Exam       Right Left   Lids/Lashes Dermatochalasis - upper lid, mild Meibomian gland dysfunction, Telangiectasia Dermatochalasis - upper lid, Meibomian gland dysfunction, Telangiectasia   Conjunctiva/Sclera White and quiet White and quiet   Cornea Clear, well healed temporal cataract wounds Trace Punctate epithelial erosions, trace EBMD   Anterior Chamber Deep and quiet Deep and quiet   Iris Round and dilated, No NVI Round and dilated, No NVI  Lens PC IOL in good position, 1-2+ Posterior capsular opacification, PC folds 2-3+ Nuclear sclerosis with mild brunescence, 2-3+ Cortical cataract, 1+ Posterior subcapsular cataract   Anterior Vitreous post vitrectomy, trace pigment Vitreous syneresis, mild vitreous condensations, no heme, old  white VH settled inferiorly         Fundus Exam       Right Left   Disc mild Pallor, Sharp rim, temporal PPA Pink and sharp, Compact, PPA   C/D Ratio 0.2 0.0   Macula Flat, good foveal reflex, mild cystic changes temporal macula -- persistent / slightly increased, focal MA and exudates ST mac, +focal laser changes Flat, good foveal reflex, trace cystic changes, scattered MA; trace ERM, light focal laser changes   Vessels attenuated, Tortuous attenuated, Tortuous   Periphery Attached, rare MA, scattered DBH greatest posteriorly, 360 PRP, good laser fill in 360 attached, scattered IRH; 360 PRP scars -- with good fill in changes           Refraction     Wearing Rx       Sphere Cylinder   Right -3.75 Sphere   Left -3.75 Sphere           IMAGING AND PROCEDURES  Imaging and Procedures for   OCT, Retina - OU - Both Eyes       Right Eye Quality was good. Central Foveal Thickness: 364. Progression has worsened. Findings include no SRF, abnormal foveal contour, intraretinal hyper-reflective material, epiretinal membrane, intraretinal fluid (Persistent IRF/cystic changes temporal macula and fovea -- slightly increased).   Left Eye Quality was good. Central Foveal Thickness: 286. Progression has worsened. Findings include normal foveal contour, no IRF, no SRF, intraretinal hyper-reflective material (Interval re-development of IRF/cystic changes temporal fovea, stable improvement in vitreous opacities).   Notes *Images captured and stored on drive  Diagnosis / Impression:  DME OU OD: Persistent IRF/cystic changes temporal macula and fovea -- slightly increased OS: Interval re-development of IRF/cystic changes temporal fovea, stable improvement in vitreous opacities  Clinical management:  See below  Abbreviations: NFP - Normal foveal profile. CME - cystoid macular edema. PED - pigment epithelial detachment. IRF - intraretinal fluid. SRF - subretinal fluid. EZ - ellipsoid  zone. ERM - epiretinal membrane. ORA - outer retinal atrophy. ORT - outer retinal tubulation. SRHM - subretinal hyper-reflective material       Intravitreal Injection, Pharmacologic Agent - OD - Right Eye       Time Out 07/03/2022. 2:30 PM. Confirmed correct patient, procedure, site, and patient consented.   Anesthesia Topical anesthesia was used. Anesthetic medications included Lidocaine 2%, Proparacaine 0.5%.   Procedure Preparation included 5% betadine to ocular surface, eyelid speculum. A (32g) needle was used.   Injection: 6 mg faricimab-svoa 6 MG/0.05ML   Route: Intravitreal, Site: Right Eye   NDC: [phone], Lot: [id number], Expiration date: 05/27/2024, Waste: 0 mL   Post-op Post injection exam found visual acuity of at least counting fingers. The patient tolerated the procedure well. There were no complications. The patient received written and verbal post procedure care education. Post injection medications were not given.      Intravitreal Injection, Pharmacologic Agent - OS - Left Eye       Time Out 07/03/2022. 2:31 PM. Confirmed correct patient, procedure, site, and patient consented.   Anesthesia Topical anesthesia was used. Anesthetic medications included Lidocaine 2%, Proparacaine 0.5%.   Procedure Preparation included 5% betadine to ocular surface, eyelid speculum. A (32g) needle was used.   Injection: 2 mg aflibercept  2 MG/0.05ML   Route: Intravitreal, Site: Left Eye   NDC: [id number], Lot: [id number], Expiration date: 06/25/2023, Waste: 0 mL   Post-op Post injection exam found visual acuity of at least counting fingers. The patient tolerated the procedure well. There were no complications. The patient received written and verbal post procedure care education. Post injection medications were not given.             ASSESSMENT/PLAN:   ICD-10-CM   1. Proliferative diabetic retinopathy of both eyes with macular edema associated with type 2 diabetes  mellitus (HCC)  E11.3513 OCT, Retina - OU - Both Eyes    Intravitreal Injection, Pharmacologic Agent - OD - Right Eye    Intravitreal Injection, Pharmacologic Agent - OS - Left Eye    aflibercept (EYLEA) SOLN 2 mg    faricimab-svoa (VABYSMO) 6mg /0.4mL intravitreal injection    2. Current use of insulin (HCC)  Z79.4     3. Long term (current) use of oral hypoglycemic drugs  Z79.84     4. Long-term (current) use of injectable non-insulin antidiabetic drugs  Z79.85     5. Vitreous hemorrhage of left eye (HCC)  H43.12     6. Vitreous hemorrhage, right eye (HCC)  H43.11     7. Essential hypertension  I10     8. Right endophthalmia  H44.001     9. Hypertensive retinopathy of both eyes  H35.033     10. Combined forms of age-related cataract of left eye  H25.812     11. Pseudophakia of right eye  Z96.1      1-4.  Proliferative diabetic retinopathy w/ DME, OU  - HbA1c 7.1% (3.23.23), 8.6% (9.14.22), 7.8% (06.13.22), 9.1% (02.24.22) 11.0% (11.22.21) - s/p IVA OD #1 9.20.19, #2 (10.25.19), #3 (11.15.19), #4 (12.17.19), #5 (01.14.20), #6 (2.11.20), #7 (05.29.20), #8 (08.19.20), #9 (10.30.20), #10 (12.09.20), #11 (01.11.21), #12 (02.15.21), #13 (03.23.21), #14 (04.20.21), #15 (06.08.21), #16 (07.06.21) -- IVA resistance - s/p IVA OS #1 9.27.19, #2 (10.25.19), #3 (11.15.19), #4 (12.17.19), #5 (01.14.20), #6 (2.11.20), #7 (04.26.20), #8 (05.29.20), #9 (06.26.20), #10 (08.05.20), #11 (11.11.20), #12 (12.09.20), #13 (01.11.21), #14 (02.15.21), #15 (03.23.21), #16 (04.20.21), #17 (06.08.21) -- IVA resistance, #18 (09.27.21) - s/p IVE OD #1 (08.03.21) -- sample, #2 (09.10.21), #3 (10.08.21), #4 (11.23.21), #5 (12.21.21), #6 (01.19.22) sample, #7 (2.16.22), #8 (3.22.22), #9 (04.19.22), #10 (05.27.22), #11 (06.29.22), #12 (08.03.22), #13 (09.07.22), #14 (10.10.22), #15 (11.14.22), #16 (12.12.22), #17 (01.10.23) - sample, #18 (02.07.23), #19 (03.21.23), #20 (04.18.23), #21 (05.16.23) -- IVE resistance  -  s/p IVE OS #1 (10.25.21), #2 (11.23.21), #3 (12.21.21), #4 (3.22.22), #5 (06.29.22), #6 (08.03.22), #7 (09.07.22), #8 (10.10.22), #9 (11.14.22), #10 (02.07.23), #11 (05.16.23), #12 (08.15.23), #13 (11.14.23), #14 (02.06.24)  - IVV OD #1 (06.16.23 -- sample), #2 (07.17.23), #3 (08.15.23), #4 (09.12.23), #5 (10.10.23), #6 (11.14.23), #7 (12.12.23), #8 (01.09.23), #9 (02.06.24), #10 (03.11.24), #11 (04.08.24)  - S/P PRP OS (09.20.19), (5.19.20), (08.19.20), (04.28.21), (02.02.22)  - S/P PRP OD (9.27.19 and 11.21.19), fill-in (04.14.20) (09.03.20, surgery)  - S/P focal laser OS (07.06.21), OD (09.20.22)  - FA (9.20.19) shows +NVE OU and leaking MA and capillary nonperfusion  - repeat FA 11.15.19 shows NV regressing OU  - pre-op: OD w/ VA stable at 20/25, but there is some preretinal fibrosis / tractional membranes just superior to disc and mild central DME  - s/p 25g PPV+MP+10% C3F8 gas OD (09.03.20) -- ERM/PRF removal OD  - BCVA: 20/25 OU -- stable             -  fibrosis/ERM stably improved; retina attached  - OCT shows OD: Persistent IRF/cystic changes temporal macula and fovea -- slightly increased; OS: Interval re-development of IRF/cystic changes temporal fovea, stable improvement in vitreous opacities at 4 weeks  - recommend IVV OD #12 and IVE OS #15 today 05.06.24 with follow up in 4 weeks  - new health insurance for 2024 -- authorizations for Vabysmo and Eylea approved  - **OS on ~q34m maintenance treatment schedule**  - pt in agreement  - RBA of procedure discussed, questions answered - see procedure note  - Eylea informed consent form re-signed and scanned on 05.16.2023  - Vabysmo informed consent form signed on 06.16.23  - f/u 4 weeks, DFE, OCT, possible injection(s)  5. Vitreous hemorrhage OS -- stably improved  - recurrent VH, onset 09.22.21  - etiology: secondary to PDR as described above (no RT/RD on exam)  - s/p PRP OS (9.20.19), (05.19.20), (08.19.20), (04.28.21), (02.02.22)  -  s/p IVA OS on 4.26.20, 5.29.20, 6.26.20, 8.5.20,11.11.20, 12.06.20 and so on as above   - VH clear centrally and settled inferiorly -- now white  - BCVA stable at 20/25  - f/u 4 weeks DFE, OCT  6. History of Endophthalmitis OD  - s/p IVA OU 04/09/2018  - s/p 25g PPV w/ intravitreal vanc, ceftaz and cefepime OD, 2.14.2020  - s/p intravitreal tap / vanc and ceflaz injections (02.16.20)             - gram stain (2.14.20) shows G+ cocci, WBCs mostly PMNs;   - repeat gram stain from t/i (2.16.20) -- no organisms, just WBCs             - cultures from vitreous grew rare Staph warneri; cultures from t/i -- no growth             - doing well, BCVA 20/30             - inflammation/posterior debris resolved  - monitor  7. History of Vitreous Hemorrhage OD -- cleared from PPV x2 for endophthalmitis and ERM/preretinal fibrosis   - secondary to PDR  8,9. Hypertensive retinopathy OU  - discussed importance of tight BP control.  - monitor  10. Combined form age related cataract OS  - The symptoms of cataract, surgical options, and treatments and risks were discussed with patient.   - discussed diagnosis and progression  11. Pseudophakia OD  - s/p CE/IOL OD (Dr. Laruth Bouchard, 12.11.20)  - beautiful surgeries, doing well  Ophthalmic Meds Ordered this visit:  Meds ordered this encounter  Medications   aflibercept (EYLEA) SOLN 2 mg   faricimab-svoa (VABYSMO) 6mg /0.54mL intravitreal injection     Return in about 4 weeks (around 07/31/2022) for f/u PDR OU, DFE, OCT.  There are no Patient Instructions on file for this visit.  This document serves as a record of services personally performed by Karie Chimera, MD, PhD. It was created on their behalf by De Blanch, an ophthalmic technician. The creation of this record is the provider's dictation and/or activities during the visit.    Electronically signed by: De Blanch, OA, 07/06/22  11:45 PM  This document serves as a record of services  personally performed by Karie Chimera, MD, PhD. It was created on their behalf by Glee Arvin. Manson Passey, OA an ophthalmic technician. The creation of this record is the provider's dictation and/or activities during the visit.    Electronically signed by: Glee Arvin. [place], [place] 05.06.2024 11:45 PM   Karie Chimera, M.D.,  Ph.D. Diseases & Surgery of the Retina and Vitreous [facility]  I have reviewed the above documentation for accuracy and completeness, and I agree with the above. Karie Chimera, M.D., Ph.D. 07/06/22 11:46 PM   Abbreviations: M myopia (nearsighted); A astigmatism; H hyperopia (farsighted); P presbyopia; Mrx spectacle prescription;  CTL contact lenses; OD right eye; OS left eye; OU both eyes  XT exotropia; ET esotropia; PEK punctate epithelial keratitis; PEE punctate epithelial erosions; DES dry eye syndrome; MGD meibomian gland dysfunction; ATs artificial tears; PFAT's preservative free artificial tears; NSC nuclear sclerotic cataract; PSC posterior subcapsular cataract; ERM epi-retinal membrane; PVD posterior vitreous detachment; RD retinal detachment; DM diabetes mellitus; DR diabetic retinopathy; NPDR non-proliferative diabetic retinopathy; PDR proliferative diabetic retinopathy; CSME clinically significant macular edema; DME diabetic macular edema; dbh dot blot hemorrhages; CWS cotton wool spot; POAG primary open angle glaucoma; C/D cup-to-disc ratio; HVF humphrey visual field; GVF goldmann visual field; OCT optical coherence tomography; IOP intraocular pressure; BRVO Branch retinal vein occlusion; CRVO central retinal vein occlusion; CRAO central retinal artery occlusion; BRAO branch retinal artery occlusion; RT retinal tear; SB scleral buckle; PPV pars plana vitrectomy; VH Vitreous hemorrhage; PRP panretinal laser photocoagulation; IVK intravitreal kenalog; VMT vitreomacular traction; MH Macular hole;  NVD neovascularization of the disc; NVE neovascularization  elsewhere; AREDS age related eye disease study; ARMD age related macular degeneration; POAG primary open angle glaucoma; EBMD epithelial/anterior basement membrane dystrophy; ACIOL anterior chamber intraocular lens; IOL intraocular lens; PCIOL posterior chamber intraocular lens; Phaco/IOL phacoemulsification with intraocular lens placement; PRK photorefractive keratectomy; LASIK laser assisted in situ keratomileusis; HTN hypertension; DM diabetes mellitus; COPD chronic obstructive pulmonary disease

## 2022-07-03 ENCOUNTER — Encounter (INDEPENDENT_AMBULATORY_CARE_PROVIDER_SITE_OTHER): Payer: Self-pay | Admitting: Ophthalmology

## 2022-07-03 ENCOUNTER — Ambulatory Visit (INDEPENDENT_AMBULATORY_CARE_PROVIDER_SITE_OTHER): Payer: Commercial Managed Care - HMO | Admitting: Ophthalmology

## 2022-07-03 DIAGNOSIS — E113513 Type 2 diabetes mellitus with proliferative diabetic retinopathy with macular edema, bilateral: Secondary | ICD-10-CM

## 2022-07-03 DIAGNOSIS — Z794 Long term (current) use of insulin: Secondary | ICD-10-CM | POA: Diagnosis not present

## 2022-07-03 DIAGNOSIS — Z7984 Long term (current) use of oral hypoglycemic drugs: Secondary | ICD-10-CM | POA: Diagnosis not present

## 2022-07-03 DIAGNOSIS — H44001 Unspecified purulent endophthalmitis, right eye: Secondary | ICD-10-CM

## 2022-07-03 DIAGNOSIS — H25812 Combined forms of age-related cataract, left eye: Secondary | ICD-10-CM

## 2022-07-03 DIAGNOSIS — Z7985 Long-term (current) use of injectable non-insulin antidiabetic drugs: Secondary | ICD-10-CM | POA: Diagnosis not present

## 2022-07-03 DIAGNOSIS — H4312 Vitreous hemorrhage, left eye: Secondary | ICD-10-CM

## 2022-07-03 DIAGNOSIS — H35033 Hypertensive retinopathy, bilateral: Secondary | ICD-10-CM

## 2022-07-03 DIAGNOSIS — H4311 Vitreous hemorrhage, right eye: Secondary | ICD-10-CM

## 2022-07-03 DIAGNOSIS — I10 Essential (primary) hypertension: Secondary | ICD-10-CM

## 2022-07-03 DIAGNOSIS — Z961 Presence of intraocular lens: Secondary | ICD-10-CM

## 2022-07-03 DIAGNOSIS — H4313 Vitreous hemorrhage, bilateral: Secondary | ICD-10-CM

## 2022-07-03 MED ORDER — FARICIMAB-SVOA 6 MG/0.05ML IZ SOLN
6.0000 mg | INTRAVITREAL | Status: AC | PRN
  Administered 2022-07-03: 6 mg via INTRAVITREAL

## 2022-07-03 MED ORDER — AFLIBERCEPT 2MG/0.05ML IZ SOLN FOR KALEIDOSCOPE
2.0000 mg | INTRAVITREAL | Status: AC | PRN
  Administered 2022-07-03: 2 mg via INTRAVITREAL

## 2022-07-10 ENCOUNTER — Other Ambulatory Visit: Payer: Self-pay | Admitting: Family Medicine

## 2022-07-17 ENCOUNTER — Other Ambulatory Visit: Payer: Self-pay | Admitting: Family Medicine

## 2022-07-19 ENCOUNTER — Other Ambulatory Visit: Payer: Self-pay | Admitting: Family Medicine

## 2022-07-19 DIAGNOSIS — I10 Essential (primary) hypertension: Secondary | ICD-10-CM

## 2022-07-19 DIAGNOSIS — E782 Mixed hyperlipidemia: Secondary | ICD-10-CM

## 2022-07-20 NOTE — Progress Notes (Signed)
[facility] - Clinic Note  07/31/2022     CHIEF COMPLAINT Patient presents for Retina Follow Up  HISTORY OF PRESENT ILLNESS: Gloria Gomez is a 52 y.o. female who presents to the clinic today for:  HPI     Retina Follow Up   Patient presents with  Diabetic Retinopathy.  In both eyes.  This started 4 weeks ago.  I, the attending physician,  performed the HPI with the patient and updated documentation appropriately.        Comments   Patient here for 4 weeks retina follow up for PDR OU. Patient states vision been doing well. Yesterday was a little more fuzzy. Not quite as clear as has been.      Last edited by Rennis Chris, MD on 07/31/2022  5:59 PM.    Patient states that she is having insurance trouble covering medications for diabetes. She feels that the vision is the same.   Referring physician:   HISTORICAL INFORMATION:   Selected notes from the MEDICAL RECORD NUMBER Referred for DM exam   CURRENT MEDICATIONS: No current outpatient medications on file. (Ophthalmic Drugs)   No current facility-administered medications for this visit. (Ophthalmic Drugs)   Current Outpatient Medications (Other)  Medication Sig   amLODipine (NORVASC) 5 MG tablet Take 1 tablet (5 mg total) by mouth daily.   Continuous Blood Gluc Sensor (FREESTYLE LIBRE 3 SENSOR) MISC by Does not apply route every 14 (fourteen) days.   FARXIGA 10 MG TABS tablet Take 10 mg by mouth daily.   furosemide (LASIX) 20 MG tablet TAKE 2 TABLETS BY MOUTH IN THE MORNING AND 1 IN THE EVENING   gabapentin (NEURONTIN) 300 MG capsule TAKE 1 CAPSULE BY MOUTH THREE TIMES DAILY   HUMALOG KWIKPEN 100 UNIT/ML KwikPen Inject into the skin.   HYDROcodone-acetaminophen (NORCO/VICODIN) 5-325 MG tablet Take 1-2 tablets by mouth every 6 (six) hours as needed for moderate pain.   hydroxychloroquine (PLAQUENIL) 200 MG tablet    insulin aspart (NOVOLOG) 100 UNIT/ML injection Inject 10 Units into the skin 2 (two)  times daily with a meal. Per endocrinologist   Insulin Glargine (BASAGLAR KWIKPEN) 100 UNIT/ML 10 units every morning and 24 units nightly   losartan (COZAAR) 100 MG tablet Take 1 tablet by mouth once daily   meloxicam (MOBIC) 15 MG tablet Take 1 tablet daily as needed.   metFORMIN (GLUCOPHAGE) 1000 MG tablet TAKE 1 TABLET BY MOUTH TWICE DAILY WITH MEALS   metoCLOPramide (REGLAN) 5 MG tablet TAKE 1 TABLET BY MOUTH 4 TIMES DAILY BEFORE MEAL(S) AND AT BEDTIME   metoprolol succinate (TOPROL-XL) 100 MG 24 hr tablet Take 2 tablets by mouth once daily   Multiple Vitamin (MULTIVITAMIN WITH MINERALS) TABS tablet Take 1 tablet by mouth daily.   NOVOLOG FLEXPEN 100 UNIT/ML FlexPen    pantoprazole (PROTONIX) 40 MG tablet TAKE 1 TABLET BY MOUTH ONCE DAILY .   promethazine (PHENERGAN) 12.5 MG tablet TAKE 1 TO 2 TABLETS BY MOUTH EVERY 6 HOURS AS NEEDED FOR NAUSEA   rosuvastatin (CRESTOR) 10 MG tablet Take 1 tablet (10 mg total) by mouth daily.   verapamil (CALAN-SR) 240 MG CR tablet Take by mouth.   verapamil (CALAN-SR) 240 MG CR tablet TAKE 1 TABLET BY MOUTH AT BEDTIME   VICTOZA 18 MG/3ML SOPN INJECT 1.2MG  INTO THE SKIN DAILY (Patient taking differently: 1.8 mg. INJECT 1.2MG  INTO THE SKIN DAILY)   No current facility-administered medications for this visit. (Other)   REVIEW OF SYSTEMS:  ROS   Positive for: Endocrine, Eyes Negative for: Constitutional, Gastrointestinal, Neurological, Skin, Genitourinary, Musculoskeletal, HENT, Cardiovascular, Respiratory, Psychiatric, Allergic/Imm, Heme/Lymph Last edited by Laddie Aquas, COA on 07/31/2022  2:53 PM.       ALLERGIES Allergies  Allergen Reactions   Gluten Meal Swelling   Lisinopril Cough   Pioglitazone Other (See Comments)    ELEVATED glucoses + worse chronic nausea   PAST MEDICAL HISTORY Past Medical History:  Diagnosis Date   Abdominal bloating    likely from diab gastroparesis.  Dr. Adela Lank started trial of reglan 03/2019.   Benign  brain tumor (HCC)    Cystic lesion in cerebral aqueduct region with mild hydrocephalus-- stable MRI 02/2016.  Surveillance MRI 05/2017 --dilated cerebral aqueduct related to aqueductal stenosis and subsequent mild hydrocephalus (due to the 11 mm stable cystic lesion in cerebral aqueduct---?congenitial?.   Cataract    OU   Dysmenorrhea    vicodin occ during first 2 days of cycle.   Fibromyalgia    Gluten intolerance    pt reports she underwent full GI w/u to r/o celiac dz   Hepatic steatosis    ultrasound 08/2017. Hx of very mild elevation of ALT.  Stable on u/s 06/2020   History of adenomatous polyp of colon 04/08/2019   recall Feb 2024   Hyperlipidemia, mixed    Hypertension    +white coat component   Hypertensive retinopathy of both eyes    Insomnia    Iron deficiency 01/2019   Hb 11.3. Hemoccults neg x 3 03/19/19. EGD and colonoscopy 04/08/19 showed NO cause for IDA.  Pt does have menorrhagia, though, so she'll see her GYN.  started FeSO4 325 qd approx 04/14/19.   Menorrhagia    resulting in IDA 2021   PMR (polymyalgia rheumatica) (HCC) 2022   question of; hx of elevated ESR-->better with prednisone but prednisone was contraindicated d/t eye issues/hyperglycemia-->rheum started following her 02/2021->plaquenil 04/2021   Proliferative diabetic retinopathy of both eyes (HCC)    steroid injections 10/2017--improved   Sensorineural hearing loss of left ear    Sudden left hearing loss summer 2016--no improvement with steroids 01/2015 so brain MRI done by Dr. Jenne Pane and it showed brain tumor that was determined to be benign.  Pt's hearing not bad enough for hearing aid as of 06/2016.   Type 2 diabetes with complication (HCC)    +microalbuminuria, diab retpthy, diabetic gastroparesis (gastric emptying study mildly abnl 03/2017).  Recommended lantus 08/2018 but pt declined. Mild microalbuminuria.   Uterine fibroid 2022   per pt report, pelvic u/s in GYN office   Past Surgical History:  Procedure  Laterality Date   ANOSCOPY  05/12/2019   Procedure: normal exam, minimal hemorrhoid disease. Hyertrophied anal papila, benign appearing, posterior midline. Surgeon: Romie Levee MD   CHOLECYSTECTOMY  2000   COLONOSCOPY  04/08/2019   5 adenomas, recall 3 yrs; no cause for IDA found.  Hypertrophied anal papillae->bx showed low grade dysplasia; GI referred her to colorectal surgeon.   ESOPHAGOGASTRODUODENOSCOPY  04/08/2019   mild chronic reactive gastritis. H pylori NEG.  No cause for IDA found.   GAS INSERTION Right 10/31/2018   Procedure: Insertion Of C3F8 Gas;  Surgeon: Rennis Chris, MD;  Location: [facility] OR;  Service: Ophthalmology;  Laterality: Right;   GASTRIC EMPTYING SCAN  04/20/2017   Mildly abnormal, particularly the 1st hour of emptying.   MEMBRANE PEEL Right 10/31/2018   Procedure: MEMBRANE PEEL;  Surgeon: Rennis Chris, MD;  Location: [facility] OR;  Service: Ophthalmology;  Laterality: Right;   PARS PLANA VITRECTOMY Right 04/12/2018   Procedure: Right PARS PLANA VITRECTOMY WITH 25 GAUGE with intravitreal antibiotics;  Surgeon: Rennis Chris, MD;  Location: [facility] OR;  Service: Ophthalmology;  Laterality: Right;   PARS PLANA VITRECTOMY Right 10/31/2018   Procedure: PARS PLANA VITRECTOMY WITH 25 GAUGE;  Surgeon: Rennis Chris, MD;  Location: [facility] OR;  Service: Ophthalmology;  Laterality: Right;   PHOTOCOAGULATION WITH LASER Right 10/31/2018   Procedure: Photocoagulation With Laser;  Surgeon: Rennis Chris, MD;  Location: [facility] OR;  Service: Ophthalmology;  Laterality: Right;   TRANSTHORACIC ECHOCARDIOGRAM  08/23/2020   Grd I DD, o/w normal.   FAMILY HISTORY Family History  Problem Relation Age of Onset   Brain cancer Mother    Diabetes Father    Diabetes Maternal Grandmother    Cataracts Maternal Grandmother    Cervical cancer Paternal Grandmother    Colon cancer Maternal Grandfather 94   Amblyopia Neg Hx    Blindness Neg Hx    Glaucoma Neg Hx    Macular degeneration Neg Hx    Retinal  detachment Neg Hx    Strabismus Neg Hx    Retinitis pigmentosa Neg Hx    Esophageal cancer Neg Hx    Stomach cancer Neg Hx    Rectal cancer Neg Hx    SOCIAL HISTORY Social History   Tobacco Use   Smoking status: Never   Smokeless tobacco: Never  Vaping Use   Vaping Use: Never used  Substance Use Topics   Alcohol use: No   Drug use: No       OPHTHALMIC EXAM:  Base Eye Exam     Visual Acuity (Snellen - Linear)       Right Left   Dist [place] 20/25 -2    Dist cc  20/25 -2         Tonometry (Tonopen, 2:50 PM)       Right Left   Pressure 11 14         Pupils       Dark Light Shape React APD   Right 3 2 Round Brisk None   Left 3 2 Round Brisk None         Visual Fields (Counting fingers)       Left Right    Full Full         Extraocular Movement       Right Left    Full, Ortho Full, Ortho         Neuro/Psych     Oriented x3: Yes   Mood/Affect: Normal         Dilation     Both eyes: 1.0% Mydriacyl, 2.5% Phenylephrine @ 2:50 PM           Slit Lamp and Fundus Exam     Slit Lamp Exam       Right Left   Lids/Lashes Dermatochalasis - upper lid, mild Meibomian gland dysfunction, Telangiectasia Dermatochalasis - upper lid, Meibomian gland dysfunction, Telangiectasia   Conjunctiva/Sclera White and quiet White and quiet   Cornea Clear, well healed temporal cataract wounds Trace Punctate epithelial erosions, trace EBMD   Anterior Chamber Deep and quiet Deep and quiet   Iris Round and dilated, No NVI Round and dilated, No NVI   Lens PC IOL in good position, 1-2+ Posterior capsular opacification, PC folds 2-3+ Nuclear sclerosis with mild brunescence, 2-3+ Cortical cataract, 1+ Posterior subcapsular cataract   Anterior Vitreous post vitrectomy, trace pigment Vitreous syneresis, mild vitreous  condensations, no heme, old white VH settled inferiorly         Fundus Exam       Right Left   Disc mild Pallor, Sharp rim, temporal PPA Pink and  sharp, Compact, PPA   C/D Ratio 0.2 0.0   Macula Flat, good foveal reflex, mild cystic changes temporal macula -- persistent / slightly improved, focal MA and exudates ST mac, +focal laser changes Flat, good foveal reflex, trace cystic changes--improving, scattered MA; trace ERM, light focal laser changes   Vessels attenuated, Tortuous attenuated, Tortuous   Periphery Attached, rare MA, scattered DBH greatest posteriorly, 360 PRP, good laser fill in 360 attached, scattered IRH; 360 PRP scars -- with good fill in changes           Refraction     Wearing Rx       Sphere Cylinder   Right -3.75 Sphere   Left -3.75 Sphere           IMAGING AND PROCEDURES  Imaging and Procedures for   OCT, Retina - OU - Both Eyes       Right Eye Quality was good. Central Foveal Thickness: 331. Progression has improved. Findings include no SRF, abnormal foveal contour, intraretinal hyper-reflective material, epiretinal membrane, intraretinal fluid (Mild interval improvement in IRF/cystic changes temporal macula and fovea ).   Left Eye Quality was good. Central Foveal Thickness: 273. Progression has improved. Findings include normal foveal contour, no IRF, no SRF, intraretinal hyper-reflective material (Interval improvement in IRF/cystic changes temporal fovea, stable improvement in vitreous opacities).   Notes *Images captured and stored on drive  Diagnosis / Impression:  DME OU RU:EAVW interval improvement in IRF/cystic changes temporal macula and fovea  UJ:[id number] improvement in IRF/cystic changes temporal fovea, stable improvement in vitreous opacities  Clinical management:  See below  Abbreviations: NFP - Normal foveal profile. CME - cystoid macular edema. PED - pigment epithelial detachment. IRF - intraretinal fluid. SRF - subretinal fluid. EZ - ellipsoid zone. ERM - epiretinal membrane. ORA - outer retinal atrophy. ORT - outer retinal tubulation. SRHM - subretinal hyper-reflective  material       Intravitreal Injection, Pharmacologic Agent - OD - Right Eye       Time Out 07/31/2022. 3:22 PM. Confirmed correct patient, procedure, site, and patient consented.   Anesthesia Topical anesthesia was used. Anesthetic medications included Lidocaine 2%, Proparacaine 0.5%.   Procedure Preparation included 5% betadine to ocular surface, eyelid speculum. A (32g) needle was used.   Injection: 6 mg faricimab-svoa 6 MG/0.05ML   Route: Intravitreal, Site: Right Eye   NDC: O8010301, Lot: [id number], Expiration date: 05/27/2024, Waste: 0 mL   Post-op Post injection exam found visual acuity of at least counting fingers. The patient tolerated the procedure well. There were no complications. The patient received written and verbal post procedure care education. Post injection medications were not given.            ASSESSMENT/PLAN:   ICD-10-CM   1. Proliferative diabetic retinopathy of both eyes with macular edema associated with type 2 diabetes mellitus (HCC)  E11.3513 OCT, Retina - OU - Both Eyes    Intravitreal Injection, Pharmacologic Agent - OD - Right Eye    faricimab-svoa (VABYSMO) 6mg /0.89mL intravitreal injection    CANCELED: Intravitreal Injection, Pharmacologic Agent - OS - Left Eye    2. Current use of insulin (HCC)  Z79.4     3. Long term (current) use of oral hypoglycemic drugs  Z79.84     4.  Long-term (current) use of injectable non-insulin antidiabetic drugs  Z79.85     5. Vitreous hemorrhage of left eye (HCC)  H43.12     6. Vitreous hemorrhage, right eye (HCC)  H43.11     7. Essential hypertension  I10     8. Right endophthalmia  H44.001     9. Hypertensive retinopathy of both eyes  H35.033     10. Combined forms of age-related cataract of left eye  H25.812     11. Pseudophakia of right eye  Z96.1      1-4.  Proliferative diabetic retinopathy w/ DME, OU - HbA1c 7.1% (3.23.23), 8.6% (9.14.22), 7.8% (06.13.22), 9.1% (02.24.22) 11.0%  (11.22.21) - s/p IVA OD #1 9.20.19, #2 (10.25.19), #3 (11.15.19), #4 (12.17.19), #5 (01.14.20), #6 (2.11.20), #7 (05.29.20), #8 (08.19.20), #9 (10.30.20), #10 (12.09.20), #11 (01.11.21), #12 (02.15.21), #13 (03.23.21), #14 (04.20.21), #15 (06.08.21), #16 (07.06.21) -- IVA resistance - s/p IVA OS #1 9.27.19, #2 (10.25.19), #3 (11.15.19), #4 (12.17.19), #5 (01.14.20), #6 (2.11.20), #7 (04.26.20), #8 (05.29.20), #9 (06.26.20), #10 (08.05.20), #11 (11.11.20), #12 (12.09.20), #13 (01.11.21), #14 (02.15.21), #15 (03.23.21), #16 (04.20.21), #17 (06.08.21) -- IVA resistance, #18 (09.27.21) ============================================================ - s/p IVE OD #1 (08.03.21) -- sample, #2 (09.10.21), #3 (10.08.21), #4 (11.23.21), #5 (12.21.21), #6 (01.19.22) sample, #7 (2.16.22), #8 (3.22.22), #9 (04.19.22), #10 (05.27.22), #11 (06.29.22), #12 (08.03.22), #13 (09.07.22), #14 (10.10.22), #15 (11.14.22), #16 (12.12.22), #17 (01.10.23) - sample, #18 (02.07.23), #19 (03.21.23), #20 (04.18.23), #21 (05.16.23) -- IVE resistance - s/p IVE OS #1 (10.25.21), #2 (11.23.21), #3 (12.21.21), #4 (3.22.22), #5 (06.29.22), #6 (08.03.22), #7 (09.07.22), #8 (10.10.22), #9 (11.14.22), #10 (02.07.23), #11 (05.16.23), #12 (08.15.23), #13 (11.14.23), #14 (02.06.24), #15 (05.06.24) ============================================================ - IVV OD #1 (06.16.23 -- sample), #2 (07.17.23), #3 (08.15.23), #4 (09.12.23), #5 (10.10.23), #6 (11.14.23), #7 (12.12.23), #8 (01.09.23), #9 (02.06.24), #10 (03.11.24), #11 (04.08.24), #12 (05.06.24)  - S/P PRP OS (09.20.19), (5.19.20), (08.19.20), (04.28.21), (02.02.22)  - S/P PRP OD (9.27.19 and 11.21.19), fill-in (04.14.20) (09.03.20, surgery)  - S/P focal laser OS (07.06.21), OD (09.20.22)  - FA (9.20.19) shows +NVE OU and leaking MA and capillary nonperfusion  - repeat FA 11.15.19 shows NV regressing OU - pre-op: OD w/ VA stable at 20/25, but there is some preretinal fibrosis / tractional  membranes just superior to disc and mild central DME  - s/p 25g PPV+MP+10% C3F8 gas OD (09.03.20) -- ERM/PRF removal OD  - BCVA: 20/25 OU -- stable             - fibrosis/ERM stably improved; retina attached - OCT shows OD: Mild interval improvement in IRF/cystic changes temporal macula and fovea; WU:JWJXBJYN improvement in IRF/cystic changes temporal fovea, stable improvement in vitreous opacities at 4 weeks - recommend IVV OD #13 today 06.03.24 with follow up in 4 weeks - new health insurance for 2024 -- authorizations for Vabysmo and Eylea approved  - **OS on ~q41m maintenance treatment schedule** -- will hold IVE OS today  - pt in agreement  - RBA of procedure discussed, questions answered - see procedure note  - Eylea informed consent form re-signed and scanned on 05.16.2023  - Vabysmo informed consent form signed on 06.16.23  - f/u 4 weeks, DFE, OCT, possible injection(s)  5. Vitreous hemorrhage OS -- stably improved  - recurrent VH, onset 09.22.21  - etiology: secondary to PDR as described above (no RT/RD on exam)  - s/p PRP OS (9.20.19), (05.19.20), (08.19.20), (04.28.21), (02.02.22) - s/p IVA OS on 4.26.20, 5.29.20, 6.26.20, 8.5.20,11.11.20, 12.06.20 and so on  as above   - VH clear centrally and settled inferiorly -- now white  - BCVA stable at 20/25  - f/u 4 weeks DFE, OCT  6. History of Endophthalmitis OD  - s/p IVA OU 04/09/2018  - s/p 25g PPV w/ intravitreal vanc, ceftaz and cefepime OD, 2.14.2020  - s/p intravitreal tap / vanc and ceflaz injections (02.16.20)             - gram stain (2.14.20) shows G+ cocci, WBCs mostly PMNs;   - repeat gram stain from t/i (2.16.20) -- no organisms, just WBCs             - cultures from vitreous grew rare Staph warneri; cultures from t/i -- no growth             - doing well, BCVA 20/30             - inflammation/posterior debris resolved  - monitor  7. History of Vitreous Hemorrhage OD -- cleared from PPV x2 for endophthalmitis and  ERM/preretinal fibrosis   - secondary to PDR  8,9. Hypertensive retinopathy OU  - discussed importance of tight BP control.  - monitor  10. Combined form age related cataract OS - The symptoms of cataract, surgical options, and treatments and risks were discussed with patient.   - discussed diagnosis and progression  11. Pseudophakia OD  - s/p CE/IOL OD (Dr. Laruth Bouchard, 12.11.20)  - beautiful surgeries, doing well  Ophthalmic Meds Ordered this visit:  Meds ordered this encounter  Medications   faricimab-svoa (VABYSMO) 6mg /0.78mL intravitreal injection     Return in about 4 weeks (around 08/28/2022) for f/u PDR OU , OCT, Possible, IVV, OD, IVE, OS.  There are no Patient Instructions on file for this visit.  This document serves as a record of services personally performed by Karie Chimera, MD, PhD. It was created on their behalf by De Blanch, an ophthalmic technician. The creation of this record is the provider's dictation and/or activities during the visit.    Electronically signed by: De Blanch, OA, 07/31/22  6:00 PM  This document serves as a record of services personally performed by Karie Chimera, MD, PhD. It was created on their behalf by Gerilyn Nestle, COT an ophthalmic technician. The creation of this record is the provider's dictation and/or activities during the visit.    Electronically signed by:  Gerilyn Nestle, COT  6.3.24 6:00 PM  Karie Chimera, M.D., Ph.D. Diseases & Surgery of the Retina and Vitreous [facility]  I have reviewed the above documentation for accuracy and completeness, and I agree with the above. Karie Chimera, M.D., Ph.D. 07/31/22 6:02 PM   Abbreviations: M myopia (nearsighted); A astigmatism; H hyperopia (farsighted); P presbyopia; Mrx spectacle prescription;  CTL contact lenses; OD right eye; OS left eye; OU both eyes  XT exotropia; ET esotropia; PEK punctate epithelial keratitis; PEE punctate  epithelial erosions; DES dry eye syndrome; MGD meibomian gland dysfunction; ATs artificial tears; PFAT's preservative free artificial tears; NSC nuclear sclerotic cataract; PSC posterior subcapsular cataract; ERM epi-retinal membrane; PVD posterior vitreous detachment; RD retinal detachment; DM diabetes mellitus; DR diabetic retinopathy; NPDR non-proliferative diabetic retinopathy; PDR proliferative diabetic retinopathy; CSME clinically significant macular edema; DME diabetic macular edema; dbh dot blot hemorrhages; CWS cotton wool spot; POAG primary open angle glaucoma; C/D cup-to-disc ratio; HVF humphrey visual field; GVF goldmann visual field; OCT optical coherence tomography; IOP intraocular pressure; BRVO Branch retinal vein occlusion; CRVO central  retinal vein occlusion; CRAO central retinal artery occlusion; BRAO branch retinal artery occlusion; RT retinal tear; SB scleral buckle; PPV pars plana vitrectomy; VH Vitreous hemorrhage; PRP panretinal laser photocoagulation; IVK intravitreal kenalog; VMT vitreomacular traction; MH Macular hole;  NVD neovascularization of the disc; NVE neovascularization elsewhere; AREDS age related eye disease study; ARMD age related macular degeneration; POAG primary open angle glaucoma; EBMD epithelial/anterior basement membrane dystrophy; ACIOL anterior chamber intraocular lens; IOL intraocular lens; PCIOL posterior chamber intraocular lens; Phaco/IOL phacoemulsification with intraocular lens placement; PRK photorefractive keratectomy; LASIK laser assisted in situ keratomileusis; HTN hypertension; DM diabetes mellitus; COPD chronic obstructive pulmonary disease

## 2022-07-30 ENCOUNTER — Other Ambulatory Visit: Payer: Self-pay | Admitting: Family Medicine

## 2022-07-30 DIAGNOSIS — I10 Essential (primary) hypertension: Secondary | ICD-10-CM

## 2022-07-30 DIAGNOSIS — E782 Mixed hyperlipidemia: Secondary | ICD-10-CM

## 2022-07-31 ENCOUNTER — Encounter (INDEPENDENT_AMBULATORY_CARE_PROVIDER_SITE_OTHER): Payer: Self-pay | Admitting: Ophthalmology

## 2022-07-31 ENCOUNTER — Ambulatory Visit (INDEPENDENT_AMBULATORY_CARE_PROVIDER_SITE_OTHER): Payer: Medicare Other | Admitting: Ophthalmology

## 2022-07-31 DIAGNOSIS — Z961 Presence of intraocular lens: Secondary | ICD-10-CM

## 2022-07-31 DIAGNOSIS — E113513 Type 2 diabetes mellitus with proliferative diabetic retinopathy with macular edema, bilateral: Secondary | ICD-10-CM

## 2022-07-31 DIAGNOSIS — I10 Essential (primary) hypertension: Secondary | ICD-10-CM

## 2022-07-31 DIAGNOSIS — H25812 Combined forms of age-related cataract, left eye: Secondary | ICD-10-CM

## 2022-07-31 DIAGNOSIS — H44001 Unspecified purulent endophthalmitis, right eye: Secondary | ICD-10-CM

## 2022-07-31 DIAGNOSIS — H4313 Vitreous hemorrhage, bilateral: Secondary | ICD-10-CM

## 2022-07-31 DIAGNOSIS — Z7985 Long-term (current) use of injectable non-insulin antidiabetic drugs: Secondary | ICD-10-CM

## 2022-07-31 DIAGNOSIS — Z794 Long term (current) use of insulin: Secondary | ICD-10-CM | POA: Diagnosis not present

## 2022-07-31 DIAGNOSIS — H4311 Vitreous hemorrhage, right eye: Secondary | ICD-10-CM

## 2022-07-31 DIAGNOSIS — H35033 Hypertensive retinopathy, bilateral: Secondary | ICD-10-CM

## 2022-07-31 DIAGNOSIS — Z7984 Long term (current) use of oral hypoglycemic drugs: Secondary | ICD-10-CM

## 2022-07-31 DIAGNOSIS — H4312 Vitreous hemorrhage, left eye: Secondary | ICD-10-CM

## 2022-07-31 MED ORDER — FARICIMAB-SVOA 6 MG/0.05ML IZ SOLN
6.0000 mg | INTRAVITREAL | Status: AC | PRN
Start: 2022-07-31 — End: 2022-07-31
  Administered 2022-07-31: 6 mg via INTRAVITREAL

## 2022-08-01 ENCOUNTER — Other Ambulatory Visit: Payer: Self-pay | Admitting: Family Medicine

## 2022-08-08 LAB — HEMOGLOBIN A1C: Hemoglobin A1C: 8.6

## 2022-08-10 ENCOUNTER — Other Ambulatory Visit: Payer: Self-pay | Admitting: Family Medicine

## 2022-08-10 ENCOUNTER — Ambulatory Visit: Payer: 59 | Admitting: Family Medicine

## 2022-08-10 DIAGNOSIS — I10 Essential (primary) hypertension: Secondary | ICD-10-CM

## 2022-08-10 DIAGNOSIS — E782 Mixed hyperlipidemia: Secondary | ICD-10-CM

## 2022-08-17 NOTE — Patient Instructions (Signed)

## 2022-08-18 ENCOUNTER — Other Ambulatory Visit: Payer: Self-pay | Admitting: Family Medicine

## 2022-08-18 ENCOUNTER — Encounter: Payer: Self-pay | Admitting: Family Medicine

## 2022-08-18 ENCOUNTER — Ambulatory Visit: Payer: Commercial Managed Care - HMO | Admitting: Family Medicine

## 2022-08-18 VITALS — BP 140/80 | HR 77 | Temp 97.6°F | Wt 186.4 lb

## 2022-08-18 DIAGNOSIS — E782 Mixed hyperlipidemia: Secondary | ICD-10-CM | POA: Diagnosis not present

## 2022-08-18 DIAGNOSIS — M797 Fibromyalgia: Secondary | ICD-10-CM

## 2022-08-18 DIAGNOSIS — G894 Chronic pain syndrome: Secondary | ICD-10-CM

## 2022-08-18 DIAGNOSIS — I10 Essential (primary) hypertension: Secondary | ICD-10-CM

## 2022-08-18 DIAGNOSIS — M353 Polymyalgia rheumatica: Secondary | ICD-10-CM

## 2022-08-18 MED ORDER — FUROSEMIDE 20 MG PO TABS: ORAL_TABLET | ORAL | 1 refills | Status: DC

## 2022-08-18 MED ORDER — METOPROLOL SUCCINATE ER 100 MG PO TB24: ORAL_TABLET | ORAL | 1 refills | Status: DC

## 2022-08-18 MED ORDER — METOCLOPRAMIDE HCL 5 MG PO TABS: ORAL_TABLET | ORAL | 1 refills | Status: DC

## 2022-08-18 MED ORDER — PANTOPRAZOLE SODIUM 40 MG PO TBEC: DELAYED_RELEASE_TABLET | ORAL | 1 refills | Status: DC

## 2022-08-18 MED ORDER — AMLODIPINE BESYLATE 5 MG PO TABS: 5.0000 mg | ORAL_TABLET | Freq: Every day | ORAL | 1 refills | Status: DC

## 2022-08-18 MED ORDER — LOSARTAN POTASSIUM 100 MG PO TABS: 100.0000 mg | ORAL_TABLET | Freq: Every day | ORAL | 1 refills | Status: DC

## 2022-08-18 MED ORDER — GABAPENTIN 300 MG PO CAPS: 300.0000 mg | ORAL_CAPSULE | Freq: Three times a day (TID) | ORAL | 1 refills | Status: DC

## 2022-08-18 MED ORDER — ROSUVASTATIN CALCIUM 10 MG PO TABS: 10.0000 mg | ORAL_TABLET | Freq: Every day | ORAL | 1 refills | Status: DC

## 2022-08-18 NOTE — Progress Notes (Signed)
OFFICE VISIT  08/18/2022  CC:  Chief Complaint  Patient presents with   Follow-up    6 month RCI. Possible side effects from Norvasc.    Patient is a 52 y.o. female who presents for 33-month follow-up hypertension, hyperlipidemia, and fibromyalgia with suspected superimposed polymyalgia rheumatica. A/P as of last visit: "#1 health maintenance exam: Reviewed age and gender appropriate health maintenance issues (prudent diet, regular exercise, health risks of tobacco and excessive alcohol, use of seatbelts, fire alarms in home, use of sunscreen).  Also reviewed age and gender appropriate health screening as well as vaccine recommendations. Vaccines: All up-to-date Labs: CBC, c-Met, TSH, lipid panel. Cervical ca screening: per GYN MD Breast ca screening: Up-to-date June 2023, [facility]. Colon ca screening: hx multiple polyps, recall 03/2022.   #2 mixed hyperlipidemia, LDL goal less than 70. LDL was 66 about 6 months ago. We cut her rosuvastatin down from 10 mg daily to every other day when she was having lots of musculoskeletal pain.  This did not make any difference in her pain.  If LDL not at goal today then will get her back on daily rosuvastatin.   #3 hypertension, well-controlled on amlodipine 5 mg a day, losartan 100 mg a day, and Toprol-XL 200 mg a day.   #4 chronic pain: Fibromyalgia plus polymyalgia rheumatica. Stable.  Continue as needed meloxicam and as needed Vicodin. Continue gabapentin 300 mg 3 times daily.   #5 DM managed by endo (Dr. Ronnette Hila) as of 01/13/21. Patient is happy to report that her most recent hemoglobin A1c was 7.2%."  INTERIM HX: Brisha feels pretty well. Myofascial pain from neck down over her shoulders and arms and into the hips and legs as well--this is all pretty stable, waxes and wanes as it usually has in the past.  She takes meloxicam occasionally and also takes Vicodin occasionally. Swelling in extremities has been pretty minimal  lately.  Home blood pressures checked approximately 2 times a week are consistently less than 130/80.  Vaginal bleeding has not been an issue for her lately.  She tells me she has a brief clench of the left side of her jaw that is nonpainful, happens maybe once a week lately.  No weakness, no paresthesias, no change in level of consciousness or cognition during these times.  PMP AWARE reviewed today: most recent rx for Vicodin 5-325 was filled to 03/31/22, # 40, rx by me.   Recent gabapentin prescription filled 07/21/2022, #90, prescription by me. No red flags.  ROS -> no fevers, no CP, no SOB, no wheezing, no cough, no dizziness, no HAs, no rashes, no melena/hematochezia.  No polyuria or polydipsia.  No tremors.  No acute vision or hearing abnormalities.  No dysuria or unusual/new urinary urgency or frequency.  No recent changes in lower legs. No n/v/d or abd pain.  No palpitations.    Past Medical History:  Diagnosis Date   Abdominal bloating    likely from diab gastroparesis.  Dr. Adela Lank started trial of reglan 03/2019.   Benign brain tumor (HCC)    Cystic lesion in cerebral aqueduct region with mild hydrocephalus-- stable MRI 02/2016.  Surveillance MRI 05/2017 --dilated cerebral aqueduct related to aqueductal stenosis and subsequent mild hydrocephalus (due to the 11 mm stable cystic lesion in cerebral aqueduct---?congenitial?.   Cataract    OU   Dysmenorrhea    vicodin occ during first 2 days of cycle.   Fibromyalgia    Gluten intolerance    pt reports she underwent  full GI w/u to r/o celiac dz   Hepatic steatosis    ultrasound 08/2017. Hx of very mild elevation of ALT.  Stable on u/s 06/2020   History of adenomatous polyp of colon 04/08/2019   recall Feb 2024   Hyperlipidemia, mixed    Hypertension    +white coat component   Hypertensive retinopathy of both eyes    Insomnia    Iron deficiency 01/2019   Hb 11.3. Hemoccults neg x 3 03/19/19. EGD and colonoscopy 04/08/19 showed NO  cause for IDA.  Pt does have menorrhagia, though, so she'll see her GYN.  started FeSO4 325 qd approx 04/14/19.   Menorrhagia    resulting in IDA 2021   PMR (polymyalgia rheumatica) (HCC) 2022   question of; hx of elevated ESR-->better with prednisone but prednisone was contraindicated d/t eye issues/hyperglycemia-->rheum started following her 02/2021->plaquenil 04/2021   Proliferative diabetic retinopathy of both eyes (HCC)    steroid injections 10/2017--improved   Sensorineural hearing loss of left ear    Sudden left hearing loss summer 2016--no improvement with steroids 01/2015 so brain MRI done by Dr. Jenne Pane and it showed brain tumor that was determined to be benign.  Pt's hearing not bad enough for hearing aid as of 06/2016.   Type 2 diabetes with complication (HCC)    +microalbuminuria, diab retpthy, diabetic gastroparesis (gastric emptying study mildly abnl 03/2017).  Recommended lantus 08/2018 but pt declined. Mild microalbuminuria.   Uterine fibroid 2022   per pt report, pelvic u/s in GYN office    Past Surgical History:  Procedure Laterality Date   ANOSCOPY  05/12/2019   Procedure: normal exam, minimal hemorrhoid disease. Hyertrophied anal papila, benign appearing, posterior midline. Surgeon: Romie Levee MD   CHOLECYSTECTOMY  2000   COLONOSCOPY  04/08/2019   5 adenomas, recall 3 yrs; no cause for IDA found.  Hypertrophied anal papillae->bx showed low grade dysplasia; GI referred her to colorectal surgeon.   ESOPHAGOGASTRODUODENOSCOPY  04/08/2019   mild chronic reactive gastritis. H pylori NEG.  No cause for IDA found.   GAS INSERTION Right 10/31/2018   Procedure: Insertion Of C3F8 Gas;  Surgeon: Rennis Chris, MD;  Location: [facility] OR;  Service: Ophthalmology;  Laterality: Right;   GASTRIC EMPTYING SCAN  04/20/2017   Mildly abnormal, particularly the 1st hour of emptying.   MEMBRANE PEEL Right 10/31/2018   Procedure: MEMBRANE PEEL;  Surgeon: Rennis Chris, MD;  Location: [facility] OR;   Service: Ophthalmology;  Laterality: Right;   PARS PLANA VITRECTOMY Right 04/12/2018   Procedure: Right PARS PLANA VITRECTOMY WITH 25 GAUGE with intravitreal antibiotics;  Surgeon: Rennis Chris, MD;  Location: [facility] OR;  Service: Ophthalmology;  Laterality: Right;   PARS PLANA VITRECTOMY Right 10/31/2018   Procedure: PARS PLANA VITRECTOMY WITH 25 GAUGE;  Surgeon: Rennis Chris, MD;  Location: [facility] OR;  Service: Ophthalmology;  Laterality: Right;   PHOTOCOAGULATION WITH LASER Right 10/31/2018   Procedure: Photocoagulation With Laser;  Surgeon: Rennis Chris, MD;  Location: [facility] OR;  Service: Ophthalmology;  Laterality: Right;   TRANSTHORACIC ECHOCARDIOGRAM  08/23/2020   Grd I DD, o/w normal.    Outpatient Medications Prior to Visit  Medication Sig Dispense Refill   Continuous Blood Gluc Sensor (FREESTYLE LIBRE 3 SENSOR) MISC by Does not apply route every 14 (fourteen) days.     FARXIGA 10 MG TABS tablet Take 10 mg by mouth daily.     HUMALOG KWIKPEN 100 UNIT/ML KwikPen Inject into the skin.     HYDROcodone-acetaminophen (NORCO/VICODIN) 5-325 MG tablet  Take 1-2 tablets by mouth every 6 (six) hours as needed for moderate pain. 40 tablet 0   hydroxychloroquine (PLAQUENIL) 200 MG tablet      insulin aspart (NOVOLOG) 100 UNIT/ML injection Inject 10 Units into the skin 2 (two) times daily with a meal. Per endocrinologist     Insulin Glargine (BASAGLAR KWIKPEN) 100 UNIT/ML 10 units every morning and 24 units nightly     meloxicam (MOBIC) 15 MG tablet Take 1 tablet daily as needed. 30 tablet 0   metFORMIN (GLUCOPHAGE) 1000 MG tablet TAKE 1 TABLET BY MOUTH TWICE DAILY WITH MEALS 180 tablet 0   Multiple Vitamin (MULTIVITAMIN WITH MINERALS) TABS tablet Take 1 tablet by mouth daily.     NOVOLOG FLEXPEN 100 UNIT/ML FlexPen      promethazine (PHENERGAN) 12.5 MG tablet TAKE 1 TO 2 TABLETS BY MOUTH EVERY 6 HOURS AS NEEDED FOR NAUSEA 30 tablet 0   TRULICITY 1.5 MG/0.5ML SOPN Inject into the skin.     verapamil  (CALAN-SR) 240 MG CR tablet Take by mouth.     verapamil (CALAN-SR) 240 MG CR tablet TAKE 1 TABLET BY MOUTH AT BEDTIME 90 tablet 0   VICTOZA 18 MG/3ML SOPN INJECT 1.2MG  INTO THE SKIN DAILY (Patient not taking: Reported on 08/18/2022) 18 mL 0   amLODipine (NORVASC) 5 MG tablet Take 1 tablet (5 mg total) by mouth daily. 90 tablet 1   furosemide (LASIX) 20 MG tablet TAKE 2 TABLETS BY MOUTH IN THE MORNING AND 1 IN THE EVENING 90 tablet 0   gabapentin (NEURONTIN) 300 MG capsule TAKE 1 CAPSULE BY MOUTH THREE TIMES DAILY 90 capsule 0   losartan (COZAAR) 100 MG tablet Take 1 tablet by mouth once daily 30 tablet 3   metoCLOPramide (REGLAN) 5 MG tablet TAKE 1 TABLET BY MOUTH 4 TIMES DAILY BEFORE MEAL(S) AND AT BEDTIME 360 tablet 1   metoprolol succinate (TOPROL-XL) 100 MG 24 hr tablet Take 2 tablets by mouth once daily 180 tablet 1   pantoprazole (PROTONIX) 40 MG tablet TAKE 1 TABLET BY MOUTH ONCE DAILY . 90 tablet 1   rosuvastatin (CRESTOR) 10 MG tablet Take 1 tablet (10 mg total) by mouth daily. 90 tablet 1   No facility-administered medications prior to visit.    Allergies  Allergen Reactions   Gluten Meal Swelling   Lisinopril Cough   Pioglitazone Other (See Comments)    ELEVATED glucoses + worse chronic nausea    Review of Systems As per HPI  PE:    08/18/2022   10:23 AM 08/18/2022   10:13 AM 05/08/2022   11:39 AM  Vitals with BMI  Weight  186 lbs 6 oz   Systolic 140 143 578  Diastolic 80 85 84  Pulse  77 79     Physical Exam  Gen: Alert, well appearing.  Patient is oriented to person, place, time, and situation. AFFECT: pleasant, lucid thought and speech. CV: RRR, no m/r/g.   LUNGS: CTA bilat, nonlabored resps, good aeration in all lung fields. EXT: no pitting edema.  LABS:  Last CBC Lab Results  Component Value Date   WBC 10.3 02/08/2022   HGB 12.0 02/08/2022   HCT 35.9 02/08/2022   MCV 82.5 02/08/2022   MCH 27.6 02/08/2022   RDW 14.9 02/08/2022   PLT 410 (H)  02/08/2022   Lab Results  Component Value Date   IRON 31 (L) 02/08/2022   TIBC 426 02/08/2022   FERRITIN 11 (L) 02/08/2022   Last metabolic panel Lab  Results  Component Value Date   GLUCOSE 130 (H) 02/08/2022   NA 145 02/08/2022   K 4.8 02/08/2022   CL 108 02/08/2022   CO2 26 02/08/2022   BUN 16 02/08/2022   CREATININE 0.86 02/08/2022   EGFR 83 12/19/2021   CALCIUM 9.7 02/08/2022   PROT 6.8 02/08/2022   ALBUMIN 4.5 12/19/2021   BILITOT 0.2 02/08/2022   ALKPHOS 113 12/19/2021   AST 21 02/08/2022   ALT 35 (H) 02/08/2022   ANIONGAP 12 10/31/2018   Last lipids Lab Results  Component Value Date   CHOL 184 02/08/2022   HDL 47 (L) 02/08/2022   LDLCALC 98 02/08/2022   LDLDIRECT 77.0 07/19/2016   TRIG 276 (H) 02/08/2022   CHOLHDL 3.9 02/08/2022   Last hemoglobin A1c Lab Results  Component Value Date   HGBA1C 8.6 08/08/2022   Last thyroid functions Lab Results  Component Value Date   TSH 1.68 02/08/2022   T3TOTAL 129 11/10/2020   Last vitamin B12 and Folate Lab Results  Component Value Date   VITAMINB12 967 01/29/2019   IMPRESSION AND PLAN:  1 mixed hyperlipidemia, LDL 98 and trigs 276 about 6 mo ago. We increased her rosuvastatin to 10 mg every day at that time. She is not fasting today so we will recheck lipids in 6 months.   #2 hypertension, well-controlled on amlodipine 5 mg a day, losartan 100 mg a day, and Toprol-XL 200 mg a day. Check electrolytes and creatinine today.   #3  chronic pain: Fibromyalgia plus polymyalgia rheumatica. Stable.  Continue as needed meloxicam and as needed Vicodin. Continue gabapentin 300 mg 3 times daily. She is followed by rheumatology.  #5 DM managed by endo (Dr. Ronnette Hila).  An After Visit Summary was printed and given to the patient.  FOLLOW UP: Return in about 6 months (around 02/17/2023).  Signed:  Santiago Bumpers, MD           08/18/2022

## 2022-08-19 LAB — COMPREHENSIVE METABOLIC PANEL
AG Ratio: 1.5 (calc) (ref 1.0–2.5)
ALT: 61 U/L — ABNORMAL HIGH (ref 6–29)
AST: 35 U/L (ref 10–35)
Albumin: 3.8 g/dL (ref 3.6–5.1)
Alkaline phosphatase (APISO): 94 U/L (ref 37–153)
BUN: 14 mg/dL (ref 7–25)
CO2: 20 mmol/L (ref 20–32)
Calcium: 9.3 mg/dL (ref 8.6–10.4)
Chloride: 108 mmol/L (ref 98–110)
Creat: 0.68 mg/dL (ref 0.50–1.03)
Globulin: 2.6 g/dL (calc) (ref 1.9–3.7)
Glucose, Bld: 92 mg/dL (ref 65–99)
Potassium: 3.8 mmol/L (ref 3.5–5.3)
Sodium: 141 mmol/L (ref 135–146)
Total Bilirubin: 0.2 mg/dL (ref 0.2–1.2)
Total Protein: 6.4 g/dL (ref 6.1–8.1)

## 2022-08-22 NOTE — Progress Notes (Signed)
[facility] - Clinic Note  08/28/2022     CHIEF COMPLAINT Patient presents for Retina Follow Up  HISTORY OF PRESENT ILLNESS: Gloria Gomez is a 52 y.o. female who presents to the clinic today for:  HPI     Retina Follow Up   In both eyes.  This started 4 weeks ago.  Duration of 4 weeks.  Since onset it is stable.  I, the attending physician,  performed the HPI with the patient and updated documentation appropriately.        Comments   4 week retina follow up PDR OU and IVV OD IVE OS pt is reporting no vision changes noticed she denies any flashes or floaters       Last edited by Rennis Chris, MD on 08/28/2022  4:12 PM.    Patient states her BP has been up, she states she is working with her PCP and endo to get it under control  Referring physician:   HISTORICAL INFORMATION:   Selected notes from the MEDICAL RECORD NUMBER Referred for DM exam   CURRENT MEDICATIONS: No current outpatient medications on file. (Ophthalmic Drugs)   No current facility-administered medications for this visit. (Ophthalmic Drugs)   Current Outpatient Medications (Other)  Medication Sig   amLODipine (NORVASC) 5 MG tablet Take 1 tablet (5 mg total) by mouth daily.   Continuous Blood Gluc Sensor (FREESTYLE LIBRE 3 SENSOR) MISC by Does not apply route every 14 (fourteen) days.   FARXIGA 10 MG TABS tablet Take 10 mg by mouth daily.   furosemide (LASIX) 20 MG tablet TAKE 2 TABLETS BY MOUTH IN THE MORNING AND 1 IN THE EVENING   gabapentin (NEURONTIN) 300 MG capsule Take 1 capsule (300 mg total) by mouth 3 (three) times daily.   HUMALOG KWIKPEN 100 UNIT/ML KwikPen Inject into the skin.   HYDROcodone-acetaminophen (NORCO/VICODIN) 5-325 MG tablet Take 1-2 tablets by mouth every 6 (six) hours as needed for moderate pain.   hydroxychloroquine (PLAQUENIL) 200 MG tablet    insulin aspart (NOVOLOG) 100 UNIT/ML injection Inject 10 Units into the skin 2 (two) times daily with a meal. Per  endocrinologist   Insulin Glargine (BASAGLAR KWIKPEN) 100 UNIT/ML 10 units every morning and 24 units nightly   losartan (COZAAR) 100 MG tablet Take 1 tablet (100 mg total) by mouth daily.   meloxicam (MOBIC) 15 MG tablet Take 1 tablet daily as needed.   metFORMIN (GLUCOPHAGE) 1000 MG tablet TAKE 1 TABLET BY MOUTH TWICE DAILY WITH MEALS   metoCLOPramide (REGLAN) 5 MG tablet TAKE 1 TABLET BY MOUTH 4 TIMES DAILY BEFORE MEAL(S) AND AT BEDTIME   metoprolol succinate (TOPROL-XL) 100 MG 24 hr tablet Take 2 tablets by mouth once daily   Multiple Vitamin (MULTIVITAMIN WITH MINERALS) TABS tablet Take 1 tablet by mouth daily.   NOVOLOG FLEXPEN 100 UNIT/ML FlexPen    pantoprazole (PROTONIX) 40 MG tablet TAKE 1 TABLET BY MOUTH ONCE DAILY .   promethazine (PHENERGAN) 12.5 MG tablet TAKE 1 TO 2 TABLETS BY MOUTH EVERY 6 HOURS AS NEEDED FOR NAUSEA   rosuvastatin (CRESTOR) 10 MG tablet Take 1 tablet (10 mg total) by mouth daily.   TRULICITY 1.5 MG/0.5ML SOPN Inject into the skin.   verapamil (CALAN-SR) 240 MG CR tablet Take by mouth.   verapamil (CALAN-SR) 240 MG CR tablet TAKE 1 TABLET BY MOUTH AT BEDTIME   VICTOZA 18 MG/3ML SOPN INJECT 1.2MG  INTO THE SKIN DAILY (Patient not taking: Reported on 08/18/2022)   No  current facility-administered medications for this visit. (Other)   REVIEW OF SYSTEMS: ROS   Positive for: Endocrine, Eyes Negative for: Constitutional, Gastrointestinal, Neurological, Skin, Genitourinary, Musculoskeletal, HENT, Cardiovascular, Respiratory, Psychiatric, Allergic/Imm, Heme/Lymph Last edited by Etheleen Mayhew, COT on 08/28/2022  2:52 PM.        ALLERGIES Allergies  Allergen Reactions   Gluten Meal Swelling   Lisinopril Cough   Pioglitazone Other (See Comments)    ELEVATED glucoses + worse chronic nausea   PAST MEDICAL HISTORY Past Medical History:  Diagnosis Date   Abdominal bloating    likely from diab gastroparesis.  Dr. Adela Lank started trial of reglan 03/2019.    Benign brain tumor (HCC)    Cystic lesion in cerebral aqueduct region with mild hydrocephalus-- stable MRI 02/2016.  Surveillance MRI 05/2017 --dilated cerebral aqueduct related to aqueductal stenosis and subsequent mild hydrocephalus (due to the 11 mm stable cystic lesion in cerebral aqueduct---?congenitial?.   Cataract    OU   Dysmenorrhea    vicodin occ during first 2 days of cycle.   Fibromyalgia    Gluten intolerance    pt reports she underwent full GI w/u to r/o celiac dz   Hepatic steatosis    ultrasound 08/2017. Hx of very mild elevation of ALT.  Stable on u/s 06/2020   History of adenomatous polyp of colon 04/08/2019   recall Feb 2024   Hyperlipidemia, mixed    Hypertension    +white coat component   Hypertensive retinopathy of both eyes    Insomnia    Iron deficiency 01/2019   Hb 11.3. Hemoccults neg x 3 03/19/19. EGD and colonoscopy 04/08/19 showed NO cause for IDA.  Pt does have menorrhagia, though, so she'll see her GYN.  started FeSO4 325 qd approx 04/14/19.   Menorrhagia    resulting in IDA 2021   PMR (polymyalgia rheumatica) (HCC) 2022   question of; hx of elevated ESR-->better with prednisone but prednisone was contraindicated d/t eye issues/hyperglycemia-->rheum started following her 02/2021->plaquenil 04/2021   Proliferative diabetic retinopathy of both eyes (HCC)    steroid injections 10/2017--improved   Sensorineural hearing loss of left ear    Sudden left hearing loss summer 2016--no improvement with steroids 01/2015 so brain MRI done by Dr. Jenne Pane and it showed brain tumor that was determined to be benign.  Pt's hearing not bad enough for hearing aid as of 06/2016.   Type 2 diabetes with complication (HCC)    +microalbuminuria, diab retpthy, diabetic gastroparesis (gastric emptying study mildly abnl 03/2017).  Recommended lantus 08/2018 but pt declined. Mild microalbuminuria.   Uterine fibroid 2022   per pt report, pelvic u/s in GYN office   Past Surgical History:   Procedure Laterality Date   ANOSCOPY  05/12/2019   Procedure: normal exam, minimal hemorrhoid disease. Hyertrophied anal papila, benign appearing, posterior midline. Surgeon: Romie Levee MD   CHOLECYSTECTOMY  2000   COLONOSCOPY  04/08/2019   5 adenomas, recall 3 yrs; no cause for IDA found.  Hypertrophied anal papillae->bx showed low grade dysplasia; GI referred her to colorectal surgeon.   ESOPHAGOGASTRODUODENOSCOPY  04/08/2019   mild chronic reactive gastritis. H pylori NEG.  No cause for IDA found.   GAS INSERTION Right 10/31/2018   Procedure: Insertion Of C3F8 Gas;  Surgeon: Rennis Chris, MD;  Location: [facility] OR;  Service: Ophthalmology;  Laterality: Right;   GASTRIC EMPTYING SCAN  04/20/2017   Mildly abnormal, particularly the 1st hour of emptying.   MEMBRANE PEEL Right 10/31/2018   Procedure: MEMBRANE  PEEL;  Surgeon: Rennis Chris, MD;  Location: [facility] OR;  Service: Ophthalmology;  Laterality: Right;   PARS PLANA VITRECTOMY Right 04/12/2018   Procedure: Right PARS PLANA VITRECTOMY WITH 25 GAUGE with intravitreal antibiotics;  Surgeon: Rennis Chris, MD;  Location: [facility] OR;  Service: Ophthalmology;  Laterality: Right;   PARS PLANA VITRECTOMY Right 10/31/2018   Procedure: PARS PLANA VITRECTOMY WITH 25 GAUGE;  Surgeon: Rennis Chris, MD;  Location: [facility] OR;  Service: Ophthalmology;  Laterality: Right;   PHOTOCOAGULATION WITH LASER Right 10/31/2018   Procedure: Photocoagulation With Laser;  Surgeon: Rennis Chris, MD;  Location: [facility] OR;  Service: Ophthalmology;  Laterality: Right;   TRANSTHORACIC ECHOCARDIOGRAM  08/23/2020   Grd I DD, o/w normal.   FAMILY HISTORY Family History  Problem Relation Age of Onset   Brain cancer Mother    Diabetes Father    Diabetes Maternal Grandmother    Cataracts Maternal Grandmother    Cervical cancer Paternal Grandmother    Colon cancer Maternal Grandfather 19   Amblyopia Neg Hx    Blindness Neg Hx    Glaucoma Neg Hx    Macular degeneration Neg Hx     Retinal detachment Neg Hx    Strabismus Neg Hx    Retinitis pigmentosa Neg Hx    Esophageal cancer Neg Hx    Stomach cancer Neg Hx    Rectal cancer Neg Hx    SOCIAL HISTORY Social History   Tobacco Use   Smoking status: Never   Smokeless tobacco: Never  Vaping Use   Vaping Use: Never used  Substance Use Topics   Alcohol use: No   Drug use: No       OPHTHALMIC EXAM:  Base Eye Exam     Visual Acuity (Snellen - Linear)       Right Left   Dist [place] 20/25 -3    Dist cc  20/40 -2   Dist ph [place] NI    Dist ph cc  20/25 -2    Correction: Glasses         Tonometry (Tonopen, 2:58 PM)       Right Left   Pressure 17 17         Pupils       Pupils Dark Light Shape React APD   Right PERRL 3 2 Round Brisk None   Left PERRL 3 2 Round Brisk None         Visual Fields       Left Right    Full Full         Extraocular Movement       Right Left    Full, Ortho Full, Ortho         Neuro/Psych     Oriented x3: Yes   Mood/Affect: Normal         Dilation     Both eyes: 2.5% Phenylephrine @ 2:58 PM           Slit Lamp and Fundus Exam     Slit Lamp Exam       Right Left   Lids/Lashes Dermatochalasis - upper lid, mild Meibomian gland dysfunction, Telangiectasia Dermatochalasis - upper lid, Meibomian gland dysfunction, Telangiectasia   Conjunctiva/Sclera White and quiet White and quiet   Cornea Clear, well healed temporal cataract wounds Trace Punctate epithelial erosions, trace EBMD   Anterior Chamber Deep and quiet Deep and quiet   Iris Round and dilated, No NVI Round and dilated, No NVI   Lens PC IOL in  good position, 1-2+ Posterior capsular opacification, PC folds 2-3+ Nuclear sclerosis with mild brunescence, 2-3+ Cortical cataract, 1+ Posterior subcapsular cataract   Anterior Vitreous post vitrectomy, trace pigment Vitreous syneresis, vitreous condensations -- slightly increased, no frank red heme, old white VH settled inferiorly          Fundus Exam       Right Left   Disc mild Pallor, Sharp rim, temporal PPA Pink and sharp, Compact, PPA   C/D Ratio 0.2 0.0   Macula Flat, good foveal reflex, mild cystic changes temporal macula -- persistent / slightly improved, focal MA and exudates ST mac, +focal laser changes Flat, good foveal reflex, trace cystic changes -- stably improved, scattered MA; trace ERM, light focal laser changes   Vessels attenuated, Tortuous attenuated, Tortuous   Periphery Attached, rare MA, scattered DBH greatest posteriorly, 360 PRP, good laser fill in 360 attached, scattered IRH; 360 PRP scars -- with good fill in changes           Refraction     Wearing Rx       Sphere Cylinder   Right -3.75 Sphere   Left -3.75 Sphere           IMAGING AND PROCEDURES  Imaging and Procedures for   OCT, Retina - OU - Both Eyes       Right Eye Quality was good. Central Foveal Thickness: 322. Progression has improved. Findings include no SRF, abnormal foveal contour, intraretinal hyper-reflective material, epiretinal membrane, intraretinal fluid (Persistent IRF/cystic changes temporal macula and fovea -- slightly improved).   Left Eye Quality was good. Central Foveal Thickness: 279. Progression has been stable. Findings include normal foveal contour, no IRF, no SRF, intraretinal hyper-reflective material (stable improvement in IRF/cystic changes temporal fovea, +vitreous opacities).   Notes *Images captured and stored on drive  Diagnosis / Impression:  DME OU OD: Persistent IRF/cystic changes temporal macula and fovea -- slightly improved OS: stable improvement in IRF/cystic changes temporal fovea, +vitreous opacities  Clinical management:  See below  Abbreviations: NFP - Normal foveal profile. CME - cystoid macular edema. PED - pigment epithelial detachment. IRF - intraretinal fluid. SRF - subretinal fluid. EZ - ellipsoid zone. ERM - epiretinal membrane. ORA - outer retinal atrophy. ORT - outer  retinal tubulation. SRHM - subretinal hyper-reflective material       Intravitreal Injection, Pharmacologic Agent - OD - Right Eye       Time Out 08/28/2022. 3:32 PM. Confirmed correct patient, procedure, site, and patient consented.   Anesthesia Topical anesthesia was used. Anesthetic medications included Lidocaine 2%, Proparacaine 0.5%.   Procedure Preparation included 5% betadine to ocular surface, eyelid speculum. A (32g) needle was used.   Injection: 6 mg faricimab-svoa 6 MG/0.05ML   Route: Intravitreal, Site: Right Eye   NDC: O8010301, Lot: [id number], Expiration date: 07/28/2023, Waste: 0 mL   Post-op Post injection exam found visual acuity of at least counting fingers. The patient tolerated the procedure well. There were no complications. The patient received written and verbal post procedure care education. Post injection medications were not given.   Notes **SAMPLE MEDICATION ADMINISTERED**     Intravitreal Injection, Pharmacologic Agent - OS - Left Eye       Time Out 08/28/2022. 3:40 PM. Confirmed correct patient, procedure, site, and patient consented.   Anesthesia Topical anesthesia was used. Anesthetic medications included Lidocaine 2%, Proparacaine 0.5%.   Procedure Preparation included 5% betadine to ocular surface, eyelid speculum. A (32 g) needle was used.  Injection: 2 mg aflibercept 2 MG/0.05ML   Route: Intravitreal, Site: Left Eye   NDC: 16109-604-54, Lot: [id number], Expiration date: 03/30/2023, Waste: 0 mL   Post-op Post injection exam found visual acuity of at least counting fingers. The patient tolerated the procedure well. There were no complications. The patient received written and verbal post procedure care education. Post injection medications were not given.   Notes **SAMPLE MEDICATION ADMINISTERED**           ASSESSMENT/PLAN:   ICD-10-CM   1. Proliferative diabetic retinopathy of both eyes with macular edema associated with  type 2 diabetes mellitus (HCC)  E11.3513 OCT, Retina - OU - Both Eyes    Intravitreal Injection, Pharmacologic Agent - OD - Right Eye    Intravitreal Injection, Pharmacologic Agent - OS - Left Eye    aflibercept (EYLEA) SOLN 2 mg    faricimab-svoa (VABYSMO) 6mg /0.15mL intravitreal injection    2. Current use of insulin (HCC)  Z79.4     3. Long term (current) use of oral hypoglycemic drugs  Z79.84     4. Long-term (current) use of injectable non-insulin antidiabetic drugs  Z79.85     5. Vitreous hemorrhage of left eye (HCC)  H43.12     6. Vitreous hemorrhage, right eye (HCC)  H43.11     7. Hypertensive retinopathy of both eyes  H35.033     8. Essential hypertension  I10     9. Right endophthalmia  H44.001     10. Combined forms of age-related cataract of left eye  H25.812     11. Pseudophakia of right eye  Z96.1      1-4.  Proliferative diabetic retinopathy w/ DME, OU - HbA1c 7.1% (3.23.23), 8.6% (9.14.22), 7.8% (06.13.22), 9.1% (02.24.22) 11.0% (11.22.21) - s/p IVA OD #1 9.20.19, #2 (10.25.19), #3 (11.15.19), #4 (12.17.19), #5 (01.14.20), #6 (2.11.20), #7 (05.29.20), #8 (08.19.20), #9 (10.30.20), #10 (12.09.20), #11 (01.11.21), #12 (02.15.21), #13 (03.23.21), #14 (04.20.21), #15 (06.08.21), #16 (07.06.21) -- IVA resistance - s/p IVA OS #1 9.27.19, #2 (10.25.19), #3 (11.15.19), #4 (12.17.19), #5 (01.14.20), #6 (2.11.20), #7 (04.26.20), #8 (05.29.20), #9 (06.26.20), #10 (08.05.20), #11 (11.11.20), #12 (12.09.20), #13 (01.11.21), #14 (02.15.21), #15 (03.23.21), #16 (04.20.21), #17 (06.08.21) -- IVA resistance, #18 (09.27.21) ============================================================ - s/p IVE OD #1 (08.03.21) -- sample, #2 (09.10.21), #3 (10.08.21), #4 (11.23.21), #5 (12.21.21), #6 (01.19.22) sample, #7 (2.16.22), #8 (3.22.22), #9 (04.19.22), #10 (05.27.22), #11 (06.29.22), #12 (08.03.22), #13 (09.07.22), #14 (10.10.22), #15 (11.14.22), #16 (12.12.22), #17 (01.10.23) - sample, #18  (02.07.23), #19 (03.21.23), #20 (04.18.23), #21 (05.16.23) -- IVE resistance - s/p IVE OS #1 (10.25.21), #2 (11.23.21), #3 (12.21.21), #4 (3.22.22), #5 (06.29.22), #6 (08.03.22), #7 (09.07.22), #8 (10.10.22), #9 (11.14.22), #10 (02.07.23), #11 (05.16.23), #12 (08.15.23), #13 (11.14.23), #14 (02.06.24), #15 (05.06.24) ============================================================ - IVV OD #1 (06.16.23 -- sample), #2 (07.17.23), #3 (08.15.23), #4 (09.12.23), #5 (10.10.23), #6 (11.14.23), #7 (12.12.23), #8 (01.09.23), #9 (02.06.24), #10 (03.11.24), #11 (04.08.24), #12 (05.06.24), #13 (06.03.24)  - S/P PRP OS (09.20.19), (5.19.20), (08.19.20), (04.28.21), (02.02.22)  - S/P PRP OD (9.27.19 and 11.21.19), fill-in (04.14.20) (09.03.20, surgery)  - S/P focal laser OS (07.06.21), OD (09.20.22)  - FA (9.20.19) shows +NVE OU and leaking MA and capillary nonperfusion  - repeat FA 11.15.19 shows NV regressing OU - pre-op: OD w/ VA stable at 20/25, but there is some preretinal fibrosis / tractional membranes just superior to disc and mild central DME  - s/p 25g PPV+MP+10% C3F8 gas OD (09.03.20) -- ERM/PRF removal OD  - BCVA: 20/25  OU -- stable             - fibrosis/ERM stably improved; retina attached - OCT shows OD: Persistent IRF/cystic changes temporal macula and fovea -- slightly improved; OS: stable improvement in IRF/cystic changes temporal fovea, +vitreous opacities - recommend IVV OD #14 and IVE OS #16 [SAMPLES] today 07.01.24 with follow up in 4 weeks due to unclear insurance authorization status - new health insurance for 2024 -- authorizations for [employer] pending?  - **OS on ~q61m maintenance treatment schedule**   - pt in agreement  - RBA of procedure discussed, questions answered - see procedure note  - Eylea informed consent form re-signed and scanned on 05.16.2023  - Vabysmo informed consent form signed on 06.16.23  - f/u 4 weeks, DFE, OCT, possible injection(s)  5. Vitreous  hemorrhage OS -- stably improved  - recurrent VH, onset 09.22.21  - etiology: secondary to PDR as described above (no RT/RD on exam)  - s/p PRP OS (9.20.19), (05.19.20), (08.19.20), (04.28.21), (02.02.22) - s/p IVA OS on 4.26.20, 5.29.20, 6.26.20, 8.5.20,11.11.20, 12.06.20 and so on as above   - Vit condensations slightly increasede  - BCVA stable at 20/25  - IVE as above  - f/u 4 weeks DFE, OCT  6. History of Endophthalmitis OD  - s/p IVA OU 04/09/2018  - s/p 25g PPV w/ intravitreal vanc, ceftaz and cefepime OD, 2.14.2020  - s/p intravitreal tap / vanc and ceflaz injections (02.16.20)             - gram stain (2.14.20) shows G+ cocci, WBCs mostly PMNs;   - repeat gram stain from t/i (2.16.20) -- no organisms, just WBCs             - cultures from vitreous grew rare Staph warneri; cultures from t/i -- no growth             - doing well, BCVA 20/30             - inflammation/posterior debris resolved  - monitor  7. History of Vitreous Hemorrhage OD -- cleared from PPV x2 for endophthalmitis and ERM/preretinal fibrosis   - secondary to PDR  8,9. Hypertensive retinopathy OU  - discussed importance of tight BP control.  - monitor  10. Combined form age related cataract OS - The symptoms of cataract, surgical options, and treatments and risks were discussed with patient.   - discussed diagnosis and progression  11. Pseudophakia OD  - s/p CE/IOL OD (Dr. Laruth Bouchard, 12.11.20)  - beautiful surgeries, doing well  Ophthalmic Meds Ordered this visit:  Meds ordered this encounter  Medications   aflibercept (EYLEA) SOLN 2 mg   faricimab-svoa (VABYSMO) 6mg /0.26mL intravitreal injection     Return in about 4 weeks (around 09/25/2022) for f/u PDR OU, DFE, OCT.  There are no Patient Instructions on file for this visit.  This document serves as a record of services personally performed by Karie Chimera, MD, PhD. It was created on their behalf by Glee Arvin. Manson Passey, OA an ophthalmic  technician. The creation of this record is the provider's dictation and/or activities during the visit.    Electronically signed by: Glee Arvin. Manson Passey, [place] 06.25.2024 4:16 PM  Karie Chimera, M.D., Ph.D. Diseases & Surgery of the Retina and Vitreous [facility]  I have reviewed the above documentation for accuracy and completeness, and I agree with the above. Karie Chimera, M.D., Ph.D. 08/28/22 4:19 PM   Abbreviations: Judie Petit  myopia (nearsighted); A astigmatism; H hyperopia (farsighted); P presbyopia; Mrx spectacle prescription;  CTL contact lenses; OD right eye; OS left eye; OU both eyes  XT exotropia; ET esotropia; PEK punctate epithelial keratitis; PEE punctate epithelial erosions; DES dry eye syndrome; MGD meibomian gland dysfunction; ATs artificial tears; PFAT's preservative free artificial tears; NSC nuclear sclerotic cataract; PSC posterior subcapsular cataract; ERM epi-retinal membrane; PVD posterior vitreous detachment; RD retinal detachment; DM diabetes mellitus; DR diabetic retinopathy; NPDR non-proliferative diabetic retinopathy; PDR proliferative diabetic retinopathy; CSME clinically significant macular edema; DME diabetic macular edema; dbh dot blot hemorrhages; CWS cotton wool spot; POAG primary open angle glaucoma; C/D cup-to-disc ratio; HVF humphrey visual field; GVF goldmann visual field; OCT optical coherence tomography; IOP intraocular pressure; BRVO Branch retinal vein occlusion; CRVO central retinal vein occlusion; CRAO central retinal artery occlusion; BRAO branch retinal artery occlusion; RT retinal tear; SB scleral buckle; PPV pars plana vitrectomy; VH Vitreous hemorrhage; PRP panretinal laser photocoagulation; IVK intravitreal kenalog; VMT vitreomacular traction; MH Macular hole;  NVD neovascularization of the disc; NVE neovascularization elsewhere; AREDS age related eye disease study; ARMD age related macular degeneration; POAG primary open angle glaucoma;  EBMD epithelial/anterior basement membrane dystrophy; ACIOL anterior chamber intraocular lens; IOL intraocular lens; PCIOL posterior chamber intraocular lens; Phaco/IOL phacoemulsification with intraocular lens placement; PRK photorefractive keratectomy; LASIK laser assisted in situ keratomileusis; HTN hypertension; DM diabetes mellitus; COPD chronic obstructive pulmonary disease

## 2022-08-28 ENCOUNTER — Encounter (INDEPENDENT_AMBULATORY_CARE_PROVIDER_SITE_OTHER): Payer: Self-pay | Admitting: Ophthalmology

## 2022-08-28 ENCOUNTER — Ambulatory Visit (INDEPENDENT_AMBULATORY_CARE_PROVIDER_SITE_OTHER): Payer: Commercial Managed Care - HMO | Admitting: Ophthalmology

## 2022-08-28 DIAGNOSIS — Z7985 Long-term (current) use of injectable non-insulin antidiabetic drugs: Secondary | ICD-10-CM

## 2022-08-28 DIAGNOSIS — H35033 Hypertensive retinopathy, bilateral: Secondary | ICD-10-CM

## 2022-08-28 DIAGNOSIS — Z961 Presence of intraocular lens: Secondary | ICD-10-CM

## 2022-08-28 DIAGNOSIS — Z7984 Long term (current) use of oral hypoglycemic drugs: Secondary | ICD-10-CM | POA: Diagnosis not present

## 2022-08-28 DIAGNOSIS — I10 Essential (primary) hypertension: Secondary | ICD-10-CM

## 2022-08-28 DIAGNOSIS — H44001 Unspecified purulent endophthalmitis, right eye: Secondary | ICD-10-CM

## 2022-08-28 DIAGNOSIS — E113513 Type 2 diabetes mellitus with proliferative diabetic retinopathy with macular edema, bilateral: Secondary | ICD-10-CM | POA: Diagnosis not present

## 2022-08-28 DIAGNOSIS — H4313 Vitreous hemorrhage, bilateral: Secondary | ICD-10-CM

## 2022-08-28 DIAGNOSIS — H4311 Vitreous hemorrhage, right eye: Secondary | ICD-10-CM

## 2022-08-28 DIAGNOSIS — Z794 Long term (current) use of insulin: Secondary | ICD-10-CM

## 2022-08-28 DIAGNOSIS — H4312 Vitreous hemorrhage, left eye: Secondary | ICD-10-CM

## 2022-08-28 DIAGNOSIS — H25812 Combined forms of age-related cataract, left eye: Secondary | ICD-10-CM

## 2022-08-28 MED ORDER — AFLIBERCEPT 2MG/0.05ML IZ SOLN FOR KALEIDOSCOPE
2.0000 mg | INTRAVITREAL | Status: AC | PRN
Start: 2022-08-28 — End: 2022-08-28
  Administered 2022-08-28: 2 mg via INTRAVITREAL

## 2022-08-28 MED ORDER — FARICIMAB-SVOA 6 MG/0.05ML IZ SOLN
6.0000 mg | INTRAVITREAL | Status: AC | PRN
Start: 2022-08-28 — End: 2022-08-28
  Administered 2022-08-28: 6 mg via INTRAVITREAL

## 2022-09-11 DIAGNOSIS — H25812 Combined forms of age-related cataract, left eye: Secondary | ICD-10-CM | POA: Diagnosis not present

## 2022-09-11 DIAGNOSIS — H524 Presbyopia: Secondary | ICD-10-CM | POA: Diagnosis not present

## 2022-09-11 LAB — HM DIABETES EYE EXAM

## 2022-09-19 NOTE — Progress Notes (Signed)
[facility] - Clinic Note  09/25/2022     CHIEF COMPLAINT Patient presents for Retina Follow Up  HISTORY OF PRESENT ILLNESS: Gloria Gomez is a 52 y.o. female who presents to the clinic today for:  HPI     Retina Follow Up   Patient presents with  Diabetic Retinopathy.  In both eyes.  This started 4 weeks ago.  Duration of 4 weeks.  Since onset it is stable.  I, the attending physician,  performed the HPI with the patient and updated documentation appropriately.        Comments   4 week retina follow up PDR OU pt is reporting she fell on Friday night and did noticed some shadow on Saturday but has went away her last reading was 129       Last edited by Rennis Chris, MD on 09/25/2022  5:13 PM.    Patient states she fell on Friday, July 26th. Noticed some shadow in vision Saturday, but has gone since. No trulicity for the past 2 months, so BS has been elevated.   Referring physician:   HISTORICAL INFORMATION:   Selected notes from the MEDICAL RECORD NUMBER Referred for DM exam   CURRENT MEDICATIONS: No current outpatient medications on file. (Ophthalmic Drugs)   No current facility-administered medications for this visit. (Ophthalmic Drugs)   Current Outpatient Medications (Other)  Medication Sig   amLODipine (NORVASC) 5 MG tablet Take 1 tablet (5 mg total) by mouth daily.   Continuous Blood Gluc Sensor (FREESTYLE LIBRE 3 SENSOR) MISC by Does not apply route every 14 (fourteen) days.   FARXIGA 10 MG TABS tablet Take 10 mg by mouth daily.   furosemide (LASIX) 20 MG tablet TAKE 2 TABLETS BY MOUTH IN THE MORNING AND 1 IN THE EVENING   gabapentin (NEURONTIN) 300 MG capsule Take 1 capsule (300 mg total) by mouth 3 (three) times daily.   HUMALOG KWIKPEN 100 UNIT/ML KwikPen Inject into the skin.   HYDROcodone-acetaminophen (NORCO/VICODIN) 5-325 MG tablet Take 1-2 tablets by mouth every 6 (six) hours as needed for moderate pain.   hydroxychloroquine  (PLAQUENIL) 200 MG tablet    insulin aspart (NOVOLOG) 100 UNIT/ML injection Inject 10 Units into the skin 2 (two) times daily with a meal. Per endocrinologist   Insulin Glargine (BASAGLAR KWIKPEN) 100 UNIT/ML 10 units every morning and 24 units nightly   losartan (COZAAR) 100 MG tablet Take 1 tablet (100 mg total) by mouth daily.   meloxicam (MOBIC) 15 MG tablet Take 1 tablet daily as needed.   metFORMIN (GLUCOPHAGE) 1000 MG tablet TAKE 1 TABLET BY MOUTH TWICE DAILY WITH MEALS   metoCLOPramide (REGLAN) 5 MG tablet TAKE 1 TABLET BY MOUTH 4 TIMES DAILY BEFORE MEAL(S) AND AT BEDTIME   metoprolol succinate (TOPROL-XL) 100 MG 24 hr tablet Take 2 tablets by mouth once daily   Multiple Vitamin (MULTIVITAMIN WITH MINERALS) TABS tablet Take 1 tablet by mouth daily.   NOVOLOG FLEXPEN 100 UNIT/ML FlexPen    pantoprazole (PROTONIX) 40 MG tablet TAKE 1 TABLET BY MOUTH ONCE DAILY .   promethazine (PHENERGAN) 12.5 MG tablet TAKE 1 TO 2 TABLETS BY MOUTH EVERY 6 HOURS AS NEEDED FOR NAUSEA   rosuvastatin (CRESTOR) 10 MG tablet Take 1 tablet (10 mg total) by mouth daily.   TRULICITY 1.5 MG/0.5ML SOPN Inject into the skin.   verapamil (CALAN-SR) 240 MG CR tablet Take by mouth.   verapamil (CALAN-SR) 240 MG CR tablet TAKE 1 TABLET BY MOUTH AT  BEDTIME   VICTOZA 18 MG/3ML SOPN INJECT 1.2MG  INTO THE SKIN DAILY (Patient not taking: Reported on 08/18/2022)   No current facility-administered medications for this visit. (Other)   REVIEW OF SYSTEMS: ROS   Positive for: Endocrine, Eyes Negative for: Constitutional, Gastrointestinal, Neurological, Skin, Genitourinary, Musculoskeletal, HENT, Cardiovascular, Respiratory, Psychiatric, Allergic/Imm, Heme/Lymph Last edited by Etheleen Mayhew, COT on 09/25/2022  2:42 PM.         ALLERGIES Allergies  Allergen Reactions   Gluten Meal Swelling   Lisinopril Cough   Pioglitazone Other (See Comments)    ELEVATED glucoses + worse chronic nausea   PAST MEDICAL  HISTORY Past Medical History:  Diagnosis Date   Abdominal bloating    likely from diab gastroparesis.  Dr. Adela Lank started trial of reglan 03/2019.   Benign brain tumor (HCC)    Cystic lesion in cerebral aqueduct region with mild hydrocephalus-- stable MRI 02/2016.  Surveillance MRI 05/2017 --dilated cerebral aqueduct related to aqueductal stenosis and subsequent mild hydrocephalus (due to the 11 mm stable cystic lesion in cerebral aqueduct---?congenitial?.   Cataract    OU   Dysmenorrhea    vicodin occ during first 2 days of cycle.   Fibromyalgia    Gluten intolerance    pt reports she underwent full GI w/u to r/o celiac dz   Hepatic steatosis    ultrasound 08/2017. Hx of very mild elevation of ALT.  Stable on u/s 06/2020   History of adenomatous polyp of colon 04/08/2019   recall Feb 2024   Hyperlipidemia, mixed    Hypertension    +white coat component   Hypertensive retinopathy of both eyes    Insomnia    Iron deficiency 01/2019   Hb 11.3. Hemoccults neg x 3 03/19/19. EGD and colonoscopy 04/08/19 showed NO cause for IDA.  Pt does have menorrhagia, though, so she'll see her GYN.  started FeSO4 325 qd approx 04/14/19.   Menorrhagia    resulting in IDA 2021   PMR (polymyalgia rheumatica) (HCC) 2022   question of; hx of elevated ESR-->better with prednisone but prednisone was contraindicated d/t eye issues/hyperglycemia-->rheum started following her 02/2021->plaquenil 04/2021   Proliferative diabetic retinopathy of both eyes (HCC)    steroid injections 10/2017--improved   Sensorineural hearing loss of left ear    Sudden left hearing loss summer 2016--no improvement with steroids 01/2015 so brain MRI done by Dr. Jenne Pane and it showed brain tumor that was determined to be benign.  Pt's hearing not bad enough for hearing aid as of 06/2016.   Type 2 diabetes with complication (HCC)    +microalbuminuria, diab retpthy, diabetic gastroparesis (gastric emptying study mildly abnl 03/2017).  Recommended  lantus 08/2018 but pt declined. Mild microalbuminuria.   Uterine fibroid 2022   per pt report, pelvic u/s in GYN office   Past Surgical History:  Procedure Laterality Date   ANOSCOPY  05/12/2019   Procedure: normal exam, minimal hemorrhoid disease. Hyertrophied anal papila, benign appearing, posterior midline. Surgeon: Romie Levee MD   CHOLECYSTECTOMY  2000   COLONOSCOPY  04/08/2019   5 adenomas, recall 3 yrs; no cause for IDA found.  Hypertrophied anal papillae->bx showed low grade dysplasia; GI referred her to colorectal surgeon.   ESOPHAGOGASTRODUODENOSCOPY  04/08/2019   mild chronic reactive gastritis. H pylori NEG.  No cause for IDA found.   GAS INSERTION Right 10/31/2018   Procedure: Insertion Of C3F8 Gas;  Surgeon: Rennis Chris, MD;  Location: [facility] OR;  Service: Ophthalmology;  Laterality: Right;   GASTRIC EMPTYING  SCAN  04/20/2017   Mildly abnormal, particularly the 1st hour of emptying.   MEMBRANE PEEL Right 10/31/2018   Procedure: MEMBRANE PEEL;  Surgeon: Rennis Chris, MD;  Location: [facility] OR;  Service: Ophthalmology;  Laterality: Right;   PARS PLANA VITRECTOMY Right 04/12/2018   Procedure: Right PARS PLANA VITRECTOMY WITH 25 GAUGE with intravitreal antibiotics;  Surgeon: Rennis Chris, MD;  Location: [facility] OR;  Service: Ophthalmology;  Laterality: Right;   PARS PLANA VITRECTOMY Right 10/31/2018   Procedure: PARS PLANA VITRECTOMY WITH 25 GAUGE;  Surgeon: Rennis Chris, MD;  Location: [facility] OR;  Service: Ophthalmology;  Laterality: Right;   PHOTOCOAGULATION WITH LASER Right 10/31/2018   Procedure: Photocoagulation With Laser;  Surgeon: Rennis Chris, MD;  Location: [facility] OR;  Service: Ophthalmology;  Laterality: Right;   TRANSTHORACIC ECHOCARDIOGRAM  08/23/2020   Grd I DD, o/w normal.   FAMILY HISTORY Family History  Problem Relation Age of Onset   Brain cancer Mother    Diabetes Father    Diabetes Maternal Grandmother    Cataracts Maternal Grandmother    Cervical cancer Paternal  Grandmother    Colon cancer Maternal Grandfather 24   Amblyopia Neg Hx    Blindness Neg Hx    Glaucoma Neg Hx    Macular degeneration Neg Hx    Retinal detachment Neg Hx    Strabismus Neg Hx    Retinitis pigmentosa Neg Hx    Esophageal cancer Neg Hx    Stomach cancer Neg Hx    Rectal cancer Neg Hx    SOCIAL HISTORY Social History   Tobacco Use   Smoking status: Never   Smokeless tobacco: Never  Vaping Use   Vaping status: Never Used  Substance Use Topics   Alcohol use: No   Drug use: No       OPHTHALMIC EXAM:  Base Eye Exam     Visual Acuity (Snellen - Linear)       Right Left   Dist [place] 20/25 -3    Dist cc  20/40 -2   Dist ph [place] NI    Dist ph cc  NI    Correction: Glasses         Tonometry (Tonopen, 2:47 PM)       Right Left   Pressure 16 17         Pupils       Pupils Dark Light Shape React APD   Right PERRL 3 2 Round Brisk None   Left PERRL 3 2 Round Brisk None         Visual Fields       Left Right    Full Full         Extraocular Movement       Right Left    Full, Ortho Full, Ortho         Neuro/Psych     Oriented x3: Yes   Mood/Affect: Normal         Dilation     Both eyes: 2.5% Phenylephrine @ 2:47 PM           Slit Lamp and Fundus Exam     Slit Lamp Exam       Right Left   Lids/Lashes Dermatochalasis - upper lid, mild Meibomian gland dysfunction, Telangiectasia Dermatochalasis - upper lid, Meibomian gland dysfunction, Telangiectasia   Conjunctiva/Sclera White and quiet White and quiet   Cornea Clear, well healed temporal cataract wounds Trace Punctate epithelial erosions, trace EBMD   Anterior Chamber Deep and quiet  Deep and quiet   Iris Round and dilated, No NVI Round and dilated, No NVI   Lens PC IOL in good position, 1-2+ Posterior capsular opacification, PC folds 2-3+ Nuclear sclerosis with mild brunescence, 2-3+ Cortical cataract, 1+ Posterior subcapsular cataract   Anterior Vitreous post vitrectomy,  trace pigment Vitreous syneresis, vitreous condensations -- slightly increased, no frank red heme, old white VH settled inferiorly         Fundus Exam       Right Left   Disc mild Pallor, Sharp rim, temporal PPA Pink and sharp, Compact, PPA   C/D Ratio 0.2 0.0   Macula Flat, good foveal reflex, mild cystic changes temporal macula -- persistent / slightly improved, focal MA and exudates ST mac, +focal laser changes Flat, good foveal reflex, trace cystic changes -- stably improved, scattered MA; trace ERM, light focal laser changes   Vessels attenuated, Tortuous attenuated, Tortuous   Periphery Attached, rare MA, scattered DBH greatest posteriorly, 360 PRP, good laser fill in 360 attached, scattered IRH; 360 PRP scars -- with good fill in changes           Refraction     Wearing Rx       Sphere Cylinder   Right -3.75 Sphere   Left -3.75 Sphere           IMAGING AND PROCEDURES  Imaging and Procedures for   OCT, Retina - OU - Both Eyes       Right Eye Quality was good. Central Foveal Thickness: 325. Progression has improved. Findings include no SRF, abnormal foveal contour, intraretinal hyper-reflective material, epiretinal membrane, intraretinal fluid (Persistent IRF/cystic changes temporal macula and fovea -- improved).   Left Eye Quality was good. Central Foveal Thickness: 275. Progression has improved. Findings include normal foveal contour, no IRF, no SRF, intraretinal hyper-reflective material (stable improvement in IRF/cystic changes temporal fovea, +vitreous opacities--slightly improved).   Notes *Images captured and stored on drive  Diagnosis / Impression:  DME OU OD: Persistent IRF/cystic changes temporal macula and fovea -- improved OS: stable improvement in IRF/cystic changes temporal fovea, +vitreous opacities---slightly improved  Clinical management:  See below  Abbreviations: NFP - Normal foveal profile. CME - cystoid macular edema. PED - pigment  epithelial detachment. IRF - intraretinal fluid. SRF - subretinal fluid. EZ - ellipsoid zone. ERM - epiretinal membrane. ORA - outer retinal atrophy. ORT - outer retinal tubulation. SRHM - subretinal hyper-reflective material       Intravitreal Injection, Pharmacologic Agent - OD - Right Eye       Time Out 09/25/2022. 3:43 PM. Confirmed correct patient, procedure, site, and patient consented.   Anesthesia Topical anesthesia was used. Anesthetic medications included Lidocaine 2%, Proparacaine 0.5%.   Procedure Preparation included 5% betadine to ocular surface, eyelid speculum. A (32g) needle was used.   Injection: 6 mg faricimab-svoa 6 MG/0.05ML   Route: Intravitreal, Site: Right Eye   NDC: O8010301, Lot: [id number], Expiration date: 10/27/2024, Waste: 0 mL   Post-op Post injection exam found visual acuity of at least counting fingers. The patient tolerated the procedure well. There were no complications. The patient received written and verbal post procedure care education. Post injection medications were not given.            ASSESSMENT/PLAN:   ICD-10-CM   1. Proliferative diabetic retinopathy of both eyes with macular edema associated with type 2 diabetes mellitus (HCC)  E11.3513 OCT, Retina - OU - Both Eyes    Intravitreal Injection, Pharmacologic Agent -  OD - Right Eye    faricimab-svoa (VABYSMO) 6mg /0.33mL intravitreal injection    2. Current use of insulin (HCC)  Z79.4     3. Long term (current) use of oral hypoglycemic drugs  Z79.84     4. Long-term (current) use of injectable non-insulin antidiabetic drugs  Z79.85     5. Vitreous hemorrhage of left eye (HCC)  H43.12     6. Vitreous hemorrhage, right eye (HCC)  H43.11     7. Hypertensive retinopathy of both eyes  H35.033     8. Essential hypertension  I10     9. Right endophthalmia  H44.001     10. Combined forms of age-related cataract of left eye  H25.812     11. Pseudophakia of right eye  Z96.1       1-4.  Proliferative diabetic retinopathy w/ DME, OU - HbA1c 7.1% (3.23.23), 8.6% (9.14.22), 7.8% (06.13.22), 9.1% (02.24.22) 11.0% (11.22.21) - s/p IVA OD #1 9.20.19, #2 (10.25.19), #3 (11.15.19), #4 (12.17.19), #5 (01.14.20), #6 (2.11.20), #7 (05.29.20), #8 (08.19.20), #9 (10.30.20), #10 (12.09.20), #11 (01.11.21), #12 (02.15.21), #13 (03.23.21), #14 (04.20.21), #15 (06.08.21), #16 (07.06.21) -- IVA resistance - s/p IVA OS #1 9.27.19, #2 (10.25.19), #3 (11.15.19), #4 (12.17.19), #5 (01.14.20), #6 (2.11.20), #7 (04.26.20), #8 (05.29.20), #9 (06.26.20), #10 (08.05.20), #11 (11.11.20), #12 (12.09.20), #13 (01.11.21), #14 (02.15.21), #15 (03.23.21), #16 (04.20.21), #17 (06.08.21) -- IVA resistance, #18 (09.27.21) ============================================================ - s/p IVE OD #1 (08.03.21) -- sample, #2 (09.10.21), #3 (10.08.21), #4 (11.23.21), #5 (12.21.21), #6 (01.19.22) sample, #7 (2.16.22), #8 (3.22.22), #9 (04.19.22), #10 (05.27.22), #11 (06.29.22), #12 (08.03.22), #13 (09.07.22), #14 (10.10.22), #15 (11.14.22), #16 (12.12.22), #17 (01.10.23) - sample, #18 (02.07.23), #19 (03.21.23), #20 (04.18.23), #21 (05.16.23) -- IVE resistance - s/p IVE OS #1 (10.25.21), #2 (11.23.21), #3 (12.21.21), #4 (3.22.22), #5 (06.29.22), #6 (08.03.22), #7 (09.07.22), #8 (10.10.22), #9 (11.14.22), #10 (02.07.23), #11 (05.16.23), #12 (08.15.23), #13 (11.14.23), #14 (02.06.24), #15 (05.06.24), #16 SAMPLE (07.01.24) ============================================================ - IVV OD #1 (06.16.23 -- sample), #2 (07.17.23), #3 (08.15.23), #4 (09.12.23), #5 (10.10.23), #6 (11.14.23), #7 (12.12.23), #8 (01.09.23), #9 (02.06.24), #10 (03.11.24), #11 (04.08.24), #12 (05.06.24), #13 (06.03.24), #14 (07.01.24)  - S/P PRP OS (09.20.19), (5.19.20), (08.19.20), (04.28.21), (02.02.22)  - S/P PRP OD (9.27.19 and 11.21.19), fill-in (04.14.20) (09.03.20, surgery)  - S/P focal laser OS (07.06.21), OD (09.20.22)  - FA (9.20.19)  shows +NVE OU and leaking MA and capillary nonperfusion  - repeat FA 11.15.19 shows NV regressing OU - pre-op: OD w/ VA down at 20/25, but there is some preretinal fibrosis / tractional membranes just superior to disc and mild central DME  - s/p 25g PPV+MP+10% C3F8 gas OD (09.03.20) -- ERM/PRF removal OD  - BCVA: 20/40-3 OD-- slightly decreased; OS:20/25---stable             - fibrosis/ERM stably improved; retina attached - OCT shows OD: Persistent IRF/cystic changes temporal macula and fovea -- improved; OS: stable improvement in IRF/cystic changes temporal fovea, +vitreous opacities -- slightly improved - recommend IVV OD #15  today 07.29.24 with follow up in 4 weeks - will hold on OS today -- **OS on ~q41m maintenance treatment schedule**   - pt in agreement  - RBA of procedure discussed, questions answered - see procedure note  - Eylea informed consent form re-signed and scanned on 05.16.2023  - Vabysmo informed consent form signed on 06.16.23  - f/u 4 weeks, DFE, OCT, possible injection(s)  5. Vitreous hemorrhage OS -- stably improved  - recurrent VH, onset 09.22.21  -  etiology: secondary to PDR as described above (no RT/RD on exam)  - s/p PRP OS (9.20.19), (05.19.20), (08.19.20), (04.28.21), (02.02.22) - s/p IVA OS on 4.26.20, 5.29.20, 6.26.20, 8.5.20,11.11.20, 12.06.20 and so on as above   - Vit condensations slightly increasede  - BCVA stable at 20/25  - IVEs as above  - f/u 4 weeks DFE, OCT  6. History of Endophthalmitis OD  - s/p IVA OU 04/09/2018  - s/p 25g PPV w/ intravitreal vanc, ceftaz and cefepime OD, 2.14.2020  - s/p intravitreal tap / vanc and ceflaz injections (02.16.20)             - gram stain (2.14.20) shows G+ cocci, WBCs mostly PMNs;   - repeat gram stain from t/i (2.16.20) -- no organisms, just WBCs             - cultures from vitreous grew rare Staph warneri; cultures from t/i -- no growth             - doing well, BCVA 20/30             -  inflammation/posterior debris resolved  - monitor  7. History of Vitreous Hemorrhage OD -- cleared from PPV x2 for endophthalmitis and ERM/preretinal fibrosis   - secondary to PDR  8,9. Hypertensive retinopathy OU  - discussed importance of tight BP control.  - monitor  10. Combined form age related cataract OS - The symptoms of cataract, surgical options, and treatments and risks were discussed with patient.   - discussed diagnosis and progression  11. Pseudophakia OD  - s/p CE/IOL OD (Dr. Laruth Bouchard, 12.11.20)  - beautiful surgeries, doing well  Ophthalmic Meds Ordered this visit:  Meds ordered this encounter  Medications   faricimab-svoa (VABYSMO) 6mg /0.103mL intravitreal injection     Return in about 4 weeks (around 10/23/2022) for PDR / DME OU, Dilated Exam, OCT, Possible Injxn.  There are no Patient Instructions on file for this visit.  This document serves as a record of services personally performed by Karie Chimera, MD, PhD. It was created on their behalf by De Blanch, an ophthalmic technician. The creation of this record is the provider's dictation and/or activities during the visit.    Electronically signed by: De Blanch, OA, 09/25/22  5:15 PM  This document serves as a record of services personally performed by Karie Chimera, MD, PhD. It was created on their behalf by Annalee Genta, COMT. The creation of this record is the provider's dictation and/or activities during the visit.  Electronically signed by: Annalee Genta, COMT 09/25/22 5:15 PM  Karie Chimera, M.D., Ph.D. Diseases & Surgery of the Retina and Vitreous [facility]  I have reviewed the above documentation for accuracy and completeness, and I agree with the above. Karie Chimera, M.D., Ph.D. 09/25/22 5:17 PM   Abbreviations: M myopia (nearsighted); A astigmatism; H hyperopia (farsighted); P presbyopia; Mrx spectacle prescription;  CTL contact lenses; OD right eye;  OS left eye; OU both eyes  XT exotropia; ET esotropia; PEK punctate epithelial keratitis; PEE punctate epithelial erosions; DES dry eye syndrome; MGD meibomian gland dysfunction; ATs artificial tears; PFAT's preservative free artificial tears; NSC nuclear sclerotic cataract; PSC posterior subcapsular cataract; ERM epi-retinal membrane; PVD posterior vitreous detachment; RD retinal detachment; DM diabetes mellitus; DR diabetic retinopathy; NPDR non-proliferative diabetic retinopathy; PDR proliferative diabetic retinopathy; CSME clinically significant macular edema; DME diabetic macular edema; dbh dot blot hemorrhages; CWS cotton wool spot; POAG primary open angle  glaucoma; C/D cup-to-disc ratio; HVF humphrey visual field; GVF goldmann visual field; OCT optical coherence tomography; IOP intraocular pressure; BRVO Branch retinal vein occlusion; CRVO central retinal vein occlusion; CRAO central retinal artery occlusion; BRAO branch retinal artery occlusion; RT retinal tear; SB scleral buckle; PPV pars plana vitrectomy; VH Vitreous hemorrhage; PRP panretinal laser photocoagulation; IVK intravitreal kenalog; VMT vitreomacular traction; MH Macular hole;  NVD neovascularization of the disc; NVE neovascularization elsewhere; AREDS age related eye disease study; ARMD age related macular degeneration; POAG primary open angle glaucoma; EBMD epithelial/anterior basement membrane dystrophy; ACIOL anterior chamber intraocular lens; IOL intraocular lens; PCIOL posterior chamber intraocular lens; Phaco/IOL phacoemulsification with intraocular lens placement; PRK photorefractive keratectomy; LASIK laser assisted in situ keratomileusis; HTN hypertension; DM diabetes mellitus; COPD chronic obstructive pulmonary disease

## 2022-09-25 ENCOUNTER — Ambulatory Visit (INDEPENDENT_AMBULATORY_CARE_PROVIDER_SITE_OTHER): Payer: Medicare Other | Admitting: Ophthalmology

## 2022-09-25 ENCOUNTER — Encounter (INDEPENDENT_AMBULATORY_CARE_PROVIDER_SITE_OTHER): Payer: Self-pay | Admitting: Ophthalmology

## 2022-09-25 DIAGNOSIS — H25812 Combined forms of age-related cataract, left eye: Secondary | ICD-10-CM

## 2022-09-25 DIAGNOSIS — E113513 Type 2 diabetes mellitus with proliferative diabetic retinopathy with macular edema, bilateral: Secondary | ICD-10-CM | POA: Diagnosis not present

## 2022-09-25 DIAGNOSIS — H4311 Vitreous hemorrhage, right eye: Secondary | ICD-10-CM

## 2022-09-25 DIAGNOSIS — Z794 Long term (current) use of insulin: Secondary | ICD-10-CM | POA: Diagnosis not present

## 2022-09-25 DIAGNOSIS — H44001 Unspecified purulent endophthalmitis, right eye: Secondary | ICD-10-CM

## 2022-09-25 DIAGNOSIS — Z7985 Long-term (current) use of injectable non-insulin antidiabetic drugs: Secondary | ICD-10-CM

## 2022-09-25 DIAGNOSIS — Z7984 Long term (current) use of oral hypoglycemic drugs: Secondary | ICD-10-CM | POA: Diagnosis not present

## 2022-09-25 DIAGNOSIS — H35033 Hypertensive retinopathy, bilateral: Secondary | ICD-10-CM

## 2022-09-25 DIAGNOSIS — I10 Essential (primary) hypertension: Secondary | ICD-10-CM

## 2022-09-25 DIAGNOSIS — H4313 Vitreous hemorrhage, bilateral: Secondary | ICD-10-CM

## 2022-09-25 DIAGNOSIS — H4312 Vitreous hemorrhage, left eye: Secondary | ICD-10-CM

## 2022-09-25 DIAGNOSIS — Z961 Presence of intraocular lens: Secondary | ICD-10-CM

## 2022-09-25 MED ORDER — FARICIMAB-SVOA 6 MG/0.05ML IZ SOLN
6.0000 mg | INTRAVITREAL | Status: AC | PRN
Start: 2022-09-25 — End: 2022-09-25
  Administered 2022-09-25: 6 mg via INTRAVITREAL

## 2022-10-19 NOTE — Progress Notes (Signed)
[facility] - Clinic Note  10/23/2022     CHIEF COMPLAINT Patient presents for Retina Follow Up  HISTORY OF PRESENT ILLNESS: Gloria Gomez is a 52 y.o. female who presents to the clinic today for:  HPI     Retina Follow Up   Patient presents with  Diabetic Retinopathy.  In both eyes.  This started 4 weeks ago.  Duration of 4 weeks.  Since onset it is stable.  I, the attending physician,  performed the HPI with the patient and updated documentation appropriately.        Comments   4 week retina follow up PDR OU and IVV OD pt denies any flashes or floaters       Last edited by Rennis Chris, MD on 10/23/2022  5:28 PM.    Patient feels like her vision in the right eye is "weak", she is working hard on her blood sugar, she states her highest blood sugar is around 251, she has had several lows (between 50-65), she states if it's in the 50's she will take a glucose tab to bring it back up, she states the lows happen around 9:00 at night, but by the time she wakes up it's 140-150   Referring physician:   HISTORICAL INFORMATION:   Selected notes from the MEDICAL RECORD NUMBER Referred for DM exam   CURRENT MEDICATIONS: No current outpatient medications on file. (Ophthalmic Drugs)   No current facility-administered medications for this visit. (Ophthalmic Drugs)   Current Outpatient Medications (Other)  Medication Sig   amLODipine (NORVASC) 5 MG tablet Take 1 tablet (5 mg total) by mouth daily.   Continuous Blood Gluc Sensor (FREESTYLE LIBRE 3 SENSOR) MISC by Does not apply route every 14 (fourteen) days.   FARXIGA 10 MG TABS tablet Take 10 mg by mouth daily.   furosemide (LASIX) 20 MG tablet TAKE 2 TABLETS BY MOUTH IN THE MORNING AND 1 IN THE EVENING   gabapentin (NEURONTIN) 300 MG capsule Take 1 capsule (300 mg total) by mouth 3 (three) times daily.   HUMALOG KWIKPEN 100 UNIT/ML KwikPen Inject into the skin.   HYDROcodone-acetaminophen (NORCO/VICODIN)  5-325 MG tablet Take 1-2 tablets by mouth every 6 (six) hours as needed for moderate pain.   hydroxychloroquine (PLAQUENIL) 200 MG tablet    insulin aspart (NOVOLOG) 100 UNIT/ML injection Inject 10 Units into the skin 2 (two) times daily with a meal. Per endocrinologist   Insulin Glargine (BASAGLAR KWIKPEN) 100 UNIT/ML 10 units every morning and 24 units nightly   losartan (COZAAR) 100 MG tablet Take 1 tablet (100 mg total) by mouth daily.   meloxicam (MOBIC) 15 MG tablet Take 1 tablet daily as needed.   metFORMIN (GLUCOPHAGE) 1000 MG tablet TAKE 1 TABLET BY MOUTH TWICE DAILY WITH MEALS   metoCLOPramide (REGLAN) 5 MG tablet TAKE 1 TABLET BY MOUTH 4 TIMES DAILY BEFORE MEAL(S) AND AT BEDTIME   metoprolol succinate (TOPROL-XL) 100 MG 24 hr tablet Take 2 tablets by mouth once daily   Multiple Vitamin (MULTIVITAMIN WITH MINERALS) TABS tablet Take 1 tablet by mouth daily.   NOVOLOG FLEXPEN 100 UNIT/ML FlexPen    pantoprazole (PROTONIX) 40 MG tablet TAKE 1 TABLET BY MOUTH ONCE DAILY .   promethazine (PHENERGAN) 12.5 MG tablet TAKE 1 TO 2 TABLETS BY MOUTH EVERY 6 HOURS AS NEEDED FOR NAUSEA   rosuvastatin (CRESTOR) 10 MG tablet Take 1 tablet (10 mg total) by mouth daily.   TRULICITY 1.5 MG/0.5ML SOPN Inject into the  skin.   verapamil (CALAN-SR) 240 MG CR tablet Take by mouth.   verapamil (CALAN-SR) 240 MG CR tablet TAKE 1 TABLET BY MOUTH AT BEDTIME   VICTOZA 18 MG/3ML SOPN INJECT 1.2MG  INTO THE SKIN DAILY (Patient not taking: Reported on 08/18/2022)   No current facility-administered medications for this visit. (Other)   REVIEW OF SYSTEMS: ROS   Positive for: Endocrine, Eyes Negative for: Constitutional, Gastrointestinal, Neurological, Skin, Genitourinary, Musculoskeletal, HENT, Cardiovascular, Respiratory, Psychiatric, Allergic/Imm, Heme/Lymph Last edited by Etheleen Mayhew, COT on 10/23/2022  3:04 PM.          ALLERGIES Allergies  Allergen Reactions   Gluten Meal Swelling    Lisinopril Cough   Pioglitazone Other (See Comments)    ELEVATED glucoses + worse chronic nausea   PAST MEDICAL HISTORY Past Medical History:  Diagnosis Date   Abdominal bloating    likely from diab gastroparesis.  Dr. Adela Lank started trial of reglan 03/2019.   Benign brain tumor (HCC)    Cystic lesion in cerebral aqueduct region with mild hydrocephalus-- stable MRI 02/2016.  Surveillance MRI 05/2017 --dilated cerebral aqueduct related to aqueductal stenosis and subsequent mild hydrocephalus (due to the 11 mm stable cystic lesion in cerebral aqueduct---?congenitial?.   Cataract    OU   Dysmenorrhea    vicodin occ during first 2 days of cycle.   Fibromyalgia    Gluten intolerance    pt reports she underwent full GI w/u to r/o celiac dz   Hepatic steatosis    ultrasound 08/2017. Hx of very mild elevation of ALT.  Stable on u/s 06/2020   History of adenomatous polyp of colon 04/08/2019   recall Feb 2024   Hyperlipidemia, mixed    Hypertension    +white coat component   Hypertensive retinopathy of both eyes    Insomnia    Iron deficiency 01/2019   Hb 11.3. Hemoccults neg x 3 03/19/19. EGD and colonoscopy 04/08/19 showed NO cause for IDA.  Pt does have menorrhagia, though, so she'll see her GYN.  started FeSO4 325 qd approx 04/14/19.   Menorrhagia    resulting in IDA 2021   PMR (polymyalgia rheumatica) (HCC) 2022   question of; hx of elevated ESR-->better with prednisone but prednisone was contraindicated d/t eye issues/hyperglycemia-->rheum started following her 02/2021->plaquenil 04/2021   Proliferative diabetic retinopathy of both eyes (HCC)    steroid injections 10/2017--improved   Sensorineural hearing loss of left ear    Sudden left hearing loss summer 2016--no improvement with steroids 01/2015 so brain MRI done by Dr. Jenne Pane and it showed brain tumor that was determined to be benign.  Pt's hearing not bad enough for hearing aid as of 06/2016.   Type 2 diabetes with complication (HCC)     +microalbuminuria, diab retpthy, diabetic gastroparesis (gastric emptying study mildly abnl 03/2017).  Recommended lantus 08/2018 but pt declined. Mild microalbuminuria.   Uterine fibroid 2022   per pt report, pelvic u/s in GYN office   Past Surgical History:  Procedure Laterality Date   ANOSCOPY  05/12/2019   Procedure: normal exam, minimal hemorrhoid disease. Hyertrophied anal papila, benign appearing, posterior midline. Surgeon: Romie Levee MD   CHOLECYSTECTOMY  2000   COLONOSCOPY  04/08/2019   5 adenomas, recall 3 yrs; no cause for IDA found.  Hypertrophied anal papillae->bx showed low grade dysplasia; GI referred her to colorectal surgeon.   ESOPHAGOGASTRODUODENOSCOPY  04/08/2019   mild chronic reactive gastritis. H pylori NEG.  No cause for IDA found.   GAS INSERTION Right  10/31/2018   Procedure: Insertion Of C3F8 Gas;  Surgeon: Rennis Chris, MD;  Location: [facility] OR;  Service: Ophthalmology;  Laterality: Right;   GASTRIC EMPTYING SCAN  04/20/2017   Mildly abnormal, particularly the 1st hour of emptying.   MEMBRANE PEEL Right 10/31/2018   Procedure: MEMBRANE PEEL;  Surgeon: Rennis Chris, MD;  Location: [facility] OR;  Service: Ophthalmology;  Laterality: Right;   PARS PLANA VITRECTOMY Right 04/12/2018   Procedure: Right PARS PLANA VITRECTOMY WITH 25 GAUGE with intravitreal antibiotics;  Surgeon: Rennis Chris, MD;  Location: [facility] OR;  Service: Ophthalmology;  Laterality: Right;   PARS PLANA VITRECTOMY Right 10/31/2018   Procedure: PARS PLANA VITRECTOMY WITH 25 GAUGE;  Surgeon: Rennis Chris, MD;  Location: [facility] OR;  Service: Ophthalmology;  Laterality: Right;   PHOTOCOAGULATION WITH LASER Right 10/31/2018   Procedure: Photocoagulation With Laser;  Surgeon: Rennis Chris, MD;  Location: [facility] OR;  Service: Ophthalmology;  Laterality: Right;   TRANSTHORACIC ECHOCARDIOGRAM  08/23/2020   Grd I DD, o/w normal.   FAMILY HISTORY Family History  Problem Relation Age of Onset   Brain cancer Mother     Diabetes Father    Diabetes Maternal Grandmother    Cataracts Maternal Grandmother    Cervical cancer Paternal Grandmother    Colon cancer Maternal Grandfather 2   Amblyopia Neg Hx    Blindness Neg Hx    Glaucoma Neg Hx    Macular degeneration Neg Hx    Retinal detachment Neg Hx    Strabismus Neg Hx    Retinitis pigmentosa Neg Hx    Esophageal cancer Neg Hx    Stomach cancer Neg Hx    Rectal cancer Neg Hx    SOCIAL HISTORY Social History   Tobacco Use   Smoking status: Never   Smokeless tobacco: Never  Vaping Use   Vaping status: Never Used  Substance Use Topics   Alcohol use: No   Drug use: No       OPHTHALMIC EXAM:  Base Eye Exam     Visual Acuity (Snellen - Linear)       Right Left   Dist [place] 20/25    Dist cc  20/25   Dist ph [place] NI    Dist ph cc  NI         Tonometry (Tonopen, 3:09 PM)       Right Left   Pressure 17 17         Pupils       Pupils Dark Light Shape React APD   Right PERRL 3 2 Round Brisk None   Left PERRL 3 2 Round Brisk None         Visual Fields       Left Right    Full Full         Extraocular Movement       Right Left    Full, Ortho Full, Ortho         Neuro/Psych     Oriented x3: Yes   Mood/Affect: Normal         Dilation     Both eyes: 2.5% Phenylephrine @ 3:09 PM           Slit Lamp and Fundus Exam     Slit Lamp Exam       Right Left   Lids/Lashes Dermatochalasis - upper lid, mild Meibomian gland dysfunction, Telangiectasia Dermatochalasis - upper lid, Meibomian gland dysfunction, Telangiectasia   Conjunctiva/Sclera White and quiet White and quiet  Cornea Clear, well healed temporal cataract wounds Trace Punctate epithelial erosions, trace EBMD   Anterior Chamber Deep and quiet Deep and quiet   Iris Round and dilated, No NVI Round and dilated, No NVI   Lens PC IOL in good position, 1-2+ Posterior capsular opacification, PC folds 2-3+ Nuclear sclerosis with mild brunescence, 2-3+ Cortical  cataract, 1+ Posterior subcapsular cataract   Anterior Vitreous post vitrectomy, trace pigment Vitreous syneresis, vitreous condensations, no frank red heme, old white VH clearing and settling inferiorly         Fundus Exam       Right Left   Disc mild Pallor, Sharp rim, temporal PPA Pink and sharp, Compact, PPA   C/D Ratio 0.2 0.0   Macula Flat, good foveal reflex, mild cystic changes temporal macula -- slightly increased, focal MA and exudates ST mac, +focal laser changes Flat, good foveal reflex, trace cystic changes -- stably improved, scattered MA; trace ERM, light focal laser changes   Vessels attenuated, Tortuous attenuated, Tortuous   Periphery Attached, rare MA, scattered DBH greatest posteriorly, 360 PRP, good laser fill in 360 attached, scattered IRH; 360 PRP scars -- with good fill in changes           Refraction     Wearing Rx       Sphere Cylinder   Right -3.75 Sphere   Left -3.75 Sphere           IMAGING AND PROCEDURES  Imaging and Procedures for   OCT, Retina - OU - Both Eyes       Right Eye Quality was good. Central Foveal Thickness: 353. Progression has worsened. Findings include no SRF, abnormal foveal contour, intraretinal hyper-reflective material, epiretinal membrane, intraretinal fluid (Interval increase in IRF/cystic changes temporal macula and fovea).   Left Eye Quality was good. Central Foveal Thickness: 274. Progression has been stable. Findings include normal foveal contour, no IRF, no SRF, intraretinal hyper-reflective material (stable improvement in IRF/cystic changes temporal fovea, +vitreous opacities--slightly improved).   Notes *Images captured and stored on drive  Diagnosis / Impression:  DME OU OD: Interval increase in IRF/cystic changes temporal macula and fovea OS: stable improvement in IRF/cystic changes temporal fovea, +vitreous opacities  Clinical management:  See below  Abbreviations: NFP - Normal foveal profile. CME -  cystoid macular edema. PED - pigment epithelial detachment. IRF - intraretinal fluid. SRF - subretinal fluid. EZ - ellipsoid zone. ERM - epiretinal membrane. ORA - outer retinal atrophy. ORT - outer retinal tubulation. SRHM - subretinal hyper-reflective material       Intravitreal Injection, Pharmacologic Agent - OD - Right Eye       Time Out 10/23/2022. 4:02 PM. Confirmed correct patient, procedure, site, and patient consented.   Anesthesia Topical anesthesia was used. Anesthetic medications included Lidocaine 2%, Proparacaine 0.5%.   Procedure Preparation included 5% betadine to ocular surface, eyelid speculum. A (32g) needle was used.   Injection: 6 mg faricimab-svoa 6 MG/0.05ML   Route: Intravitreal, Site: Right Eye   NDC: O8010301, Lot: [id number], Expiration date: 10/27/2024, Waste: 0 mL   Post-op Post injection exam found visual acuity of at least counting fingers. The patient tolerated the procedure well. There were no complications. The patient received written and verbal post procedure care education. Post injection medications were not given.            ASSESSMENT/PLAN:   ICD-10-CM   1. Proliferative diabetic retinopathy of both eyes with macular edema associated with type 2 diabetes mellitus (HCC)  E11.3513 OCT, Retina - OU - Both Eyes    Intravitreal Injection, Pharmacologic Agent - OD - Right Eye    faricimab-svoa (VABYSMO) 6mg /0.19mL intravitreal injection    2. Current use of insulin (HCC)  Z79.4     3. Long term (current) use of oral hypoglycemic drugs  Z79.84     4. Long-term (current) use of injectable non-insulin antidiabetic drugs  Z79.85     5. Vitreous hemorrhage of left eye (HCC)  H43.12     6. Vitreous hemorrhage, right eye (HCC)  H43.11     7. Hypertensive retinopathy of both eyes  H35.033     8. Essential hypertension  I10     9. Right endophthalmia  H44.001     10. Combined forms of age-related cataract of left eye  H25.812     11.  Pseudophakia of right eye  Z96.1       1-4.  Proliferative diabetic retinopathy w/ DME, OU - HbA1c 7.1% (3.23.23), 8.6% (9.14.22), 7.8% (06.13.22), 9.1% (02.24.22) 11.0% (11.22.21) - s/p IVA OD #1 9.20.19, #2 (10.25.19), #3 (11.15.19), #4 (12.17.19), #5 (01.14.20), #6 (2.11.20), #7 (05.29.20), #8 (08.19.20), #9 (10.30.20), #10 (12.09.20), #11 (01.11.21), #12 (02.15.21), #13 (03.23.21), #14 (04.20.21), #15 (06.08.21), #16 (07.06.21) -- IVA resistance - s/p IVA OS #1 9.27.19, #2 (10.25.19), #3 (11.15.19), #4 (12.17.19), #5 (01.14.20), #6 (2.11.20), #7 (04.26.20), #8 (05.29.20), #9 (06.26.20), #10 (08.05.20), #11 (11.11.20), #12 (12.09.20), #13 (01.11.21), #14 (02.15.21), #15 (03.23.21), #16 (04.20.21), #17 (06.08.21) -- IVA resistance, #18 (09.27.21) ============================================================ - s/p IVE OD #1 (08.03.21) -- sample, #2 (09.10.21), #3 (10.08.21), #4 (11.23.21), #5 (12.21.21), #6 (01.19.22) sample, #7 (2.16.22), #8 (3.22.22), #9 (04.19.22), #10 (05.27.22), #11 (06.29.22), #12 (08.03.22), #13 (09.07.22), #14 (10.10.22), #15 (11.14.22), #16 (12.12.22), #17 (01.10.23) - sample, #18 (02.07.23), #19 (03.21.23), #20 (04.18.23), #21 (05.16.23) -- IVE resistance - s/p IVE OS #1 (10.25.21), #2 (11.23.21), #3 (12.21.21), #4 (3.22.22), #5 (06.29.22), #6 (08.03.22), #7 (09.07.22), #8 (10.10.22), #9 (11.14.22), #10 (02.07.23), #11 (05.16.23), #12 (08.15.23), #13 (11.14.23), #14 (02.06.24), #15 (05.06.24), #16 SAMPLE (07.01.24) ============================================================ - IVV OD #1 (06.16.23 -- sample), #2 (07.17.23), #3 (08.15.23), #4 (09.12.23), #5 (10.10.23), #6 (11.14.23), #7 (12.12.23), #8 (01.09.23), #9 (02.06.24), #10 (03.11.24), #11 (04.08.24), #12 (05.06.24), #13 (06.03.24), #14 (07.01.24), #15 (07.29.24)  - S/P PRP OS (09.20.19), (5.19.20), (08.19.20), (04.28.21), (02.02.22)  - S/P PRP OD (9.27.19 and 11.21.19), fill-in (04.14.20) (09.03.20, surgery)  - S/P  focal laser OS (07.06.21), OD (09.20.22)  - FA (9.20.19) shows +NVE OU and leaking MA and capillary nonperfusion  - repeat FA 11.15.19 shows NV regressing OU - pre-op: OD w/ VA down at 20/25, but there is some preretinal fibrosis / tractional membranes just superior to disc and mild central DME  - s/p 25g PPV+MP+10% C3F8 gas OD (09.03.20) -- ERM/PRF removal OD  - BCVA: improved to 20/25 from 20/40 OD; OS:20/25 -- stable             - fibrosis/ERM stably improved; retina attached - OCT shows OD: Interval increase in IRF/cystic changes temporal macula and fovea; OS: stable improvement in IRF/cystic changes temporal fovea, +vitreous opacities at 4 weeks **discussed decreased efficacy / resistance to Vabysmo and potential benefit of switching medication** - recommend IVV OD #16  today 08.26.24 with follow up in 4 weeks - will hold on OS today -- **OS on ~q39m maintenance treatment schedule -- due around 10.01.24**  - pt in agreement  - RBA of procedure discussed, questions answered - see procedure note  - Eylea informed  consent form re-signed and scanned on 05.16.2023  - Vabysmo informed consent form signed on 06.16.23  - will check insurance for Eylea HD  - f/u 4 weeks, DFE, OCT, possible injection(s)  5. Vitreous hemorrhage OS -- stably improved  - recurrent VH, onset 09.22.21  - etiology: secondary to PDR as described above (no RT/RD on exam)  - s/p PRP OS (9.20.19), (05.19.20), (08.19.20), (04.28.21), (02.02.22) - s/p IVA OS on 4.26.20, 5.29.20, 6.26.20, 8.5.20,11.11.20, 12.06.20 and so on as above   - Vit condensations slightly increasede  - BCVA stable at 20/25  - IVEs as above  - f/u 4 weeks DFE, OCT  6. History of Endophthalmitis OD  - s/p IVA OU 04/09/2018  - s/p 25g PPV w/ intravitreal vanc, ceftaz and cefepime OD, 2.14.2020  - s/p intravitreal tap / vanc and ceflaz injections (02.16.20)             - gram stain (2.14.20) shows G+ cocci, WBCs mostly PMNs;   - repeat gram stain  from t/i (2.16.20) -- no organisms, just WBCs             - cultures from vitreous grew rare Staph warneri; cultures from t/i -- no growth             - doing well, BCVA 20/25             - inflammation/posterior debris resolved  - monitor  7. History of Vitreous Hemorrhage OD -- cleared from PPV x2 for endophthalmitis and ERM/preretinal fibrosis   - secondary to PDR  8,9. Hypertensive retinopathy OU  - discussed importance of tight BP control.  - monitor  10. Combined form age related cataract OS - The symptoms of cataract, surgical options, and treatments and risks were discussed with patient.   - discussed diagnosis and progression  11. Pseudophakia OD  - s/p CE/IOL OD (Dr. Laruth Bouchard, 12.11.20)  - beautiful surgeries, doing well  Ophthalmic Meds Ordered this visit:  Meds ordered this encounter  Medications   faricimab-svoa (VABYSMO) 6mg /0.73mL intravitreal injection     Return in about 4 weeks (around 11/20/2022) for PDR OU, Dilated Exam, OCT, Possible Injxn.  There are no Patient Instructions on file for this visit.   This document serves as a record of services personally performed by Karie Chimera, MD, PhD. It was created on their behalf by Annalee Genta, COMT. The creation of this record is the provider's dictation and/or activities during the visit.  Electronically signed by: Annalee Genta, COMT 10/24/22 11:12 PM  This document serves as a record of services personally performed by Karie Chimera, MD, PhD. It was created on their behalf by Glee Arvin. Manson Passey, OA an ophthalmic technician. The creation of this record is the provider's dictation and/or activities during the visit.    Electronically signed by: Glee Arvin. Manson Passey, OA 10/24/22 11:12 PM  Karie Chimera, M.D., Ph.D. Diseases & Surgery of the Retina and Vitreous [facility]  I have reviewed the above documentation for accuracy and completeness, and I agree with the above. Karie Chimera,  M.D., Ph.D. 10/24/22 11:14 PM  Abbreviations: M myopia (nearsighted); A astigmatism; H hyperopia (farsighted); P presbyopia; Mrx spectacle prescription;  CTL contact lenses; OD right eye; OS left eye; OU both eyes  XT exotropia; ET esotropia; PEK punctate epithelial keratitis; PEE punctate epithelial erosions; DES dry eye syndrome; MGD meibomian gland dysfunction; ATs artificial tears; PFAT's preservative free artificial tears; NSC nuclear sclerotic cataract;  PSC posterior subcapsular cataract; ERM epi-retinal membrane; PVD posterior vitreous detachment; RD retinal detachment; DM diabetes mellitus; DR diabetic retinopathy; NPDR non-proliferative diabetic retinopathy; PDR proliferative diabetic retinopathy; CSME clinically significant macular edema; DME diabetic macular edema; dbh dot blot hemorrhages; CWS cotton wool spot; POAG primary open angle glaucoma; C/D cup-to-disc ratio; HVF humphrey visual field; GVF goldmann visual field; OCT optical coherence tomography; IOP intraocular pressure; BRVO Branch retinal vein occlusion; CRVO central retinal vein occlusion; CRAO central retinal artery occlusion; BRAO branch retinal artery occlusion; RT retinal tear; SB scleral buckle; PPV pars plana vitrectomy; VH Vitreous hemorrhage; PRP panretinal laser photocoagulation; IVK intravitreal kenalog; VMT vitreomacular traction; MH Macular hole;  NVD neovascularization of the disc; NVE neovascularization elsewhere; AREDS age related eye disease study; ARMD age related macular degeneration; POAG primary open angle glaucoma; EBMD epithelial/anterior basement membrane dystrophy; ACIOL anterior chamber intraocular lens; IOL intraocular lens; PCIOL posterior chamber intraocular lens; Phaco/IOL phacoemulsification with intraocular lens placement; PRK photorefractive keratectomy; LASIK laser assisted in situ keratomileusis; HTN hypertension; DM diabetes mellitus; COPD chronic obstructive pulmonary disease

## 2022-10-23 ENCOUNTER — Encounter (INDEPENDENT_AMBULATORY_CARE_PROVIDER_SITE_OTHER): Payer: Self-pay | Admitting: Ophthalmology

## 2022-10-23 ENCOUNTER — Ambulatory Visit (INDEPENDENT_AMBULATORY_CARE_PROVIDER_SITE_OTHER): Payer: Medicare Other | Admitting: Ophthalmology

## 2022-10-23 DIAGNOSIS — E113513 Type 2 diabetes mellitus with proliferative diabetic retinopathy with macular edema, bilateral: Secondary | ICD-10-CM

## 2022-10-23 DIAGNOSIS — H4313 Vitreous hemorrhage, bilateral: Secondary | ICD-10-CM

## 2022-10-23 DIAGNOSIS — H44001 Unspecified purulent endophthalmitis, right eye: Secondary | ICD-10-CM

## 2022-10-23 DIAGNOSIS — H35033 Hypertensive retinopathy, bilateral: Secondary | ICD-10-CM

## 2022-10-23 DIAGNOSIS — I10 Essential (primary) hypertension: Secondary | ICD-10-CM

## 2022-10-23 DIAGNOSIS — Z7984 Long term (current) use of oral hypoglycemic drugs: Secondary | ICD-10-CM | POA: Diagnosis not present

## 2022-10-23 DIAGNOSIS — Z7985 Long-term (current) use of injectable non-insulin antidiabetic drugs: Secondary | ICD-10-CM

## 2022-10-23 DIAGNOSIS — Z794 Long term (current) use of insulin: Secondary | ICD-10-CM | POA: Diagnosis not present

## 2022-10-23 DIAGNOSIS — H25812 Combined forms of age-related cataract, left eye: Secondary | ICD-10-CM

## 2022-10-23 DIAGNOSIS — Z961 Presence of intraocular lens: Secondary | ICD-10-CM

## 2022-10-23 DIAGNOSIS — H4311 Vitreous hemorrhage, right eye: Secondary | ICD-10-CM

## 2022-10-23 DIAGNOSIS — H4312 Vitreous hemorrhage, left eye: Secondary | ICD-10-CM

## 2022-10-23 MED ORDER — FARICIMAB-SVOA 6 MG/0.05ML IZ SOLN
6.0000 mg | INTRAVITREAL | Status: AC | PRN
Start: 2022-10-23 — End: 2022-10-23
  Administered 2022-10-23: 6 mg via INTRAVITREAL

## 2022-10-28 ENCOUNTER — Other Ambulatory Visit: Payer: Self-pay | Admitting: Family Medicine

## 2022-11-14 ENCOUNTER — Other Ambulatory Visit: Payer: Self-pay | Admitting: Family Medicine

## 2022-11-17 NOTE — Progress Notes (Signed)
[facility] - Clinic Note  11/23/2022     CHIEF COMPLAINT Patient presents for Retina Follow Up  HISTORY OF PRESENT ILLNESS: Gloria Gomez is a 52 y.o. female who presents to the clinic today for:  HPI     Retina Follow Up   Patient presents with  Diabetic Retinopathy.  In both eyes.  This started 4 weeks ago.  Duration of 4 weeks.  Since onset it is stable.  I, the attending physician,  performed the HPI with the patient and updated documentation appropriately.        Comments   4 week retina follow up PDR and IVV OD and ? IVE OS pt is reporting vision is not as sharp today she denies any flashes or floaters her last reading was 109 this am A1C 8.2 few months ago       Last edited by Rennis Chris, MD on 11/23/2022  4:51 PM.    Pt denies floaters in either eye, she feels like her left eye vision has not been as sharp  Referring physician:   HISTORICAL INFORMATION:   Selected notes from the MEDICAL RECORD NUMBER Referred for DM exam   CURRENT MEDICATIONS: No current outpatient medications on file. (Ophthalmic Drugs)   No current facility-administered medications for this visit. (Ophthalmic Drugs)   Current Outpatient Medications (Other)  Medication Sig   amLODipine (NORVASC) 5 MG tablet Take 1 tablet (5 mg total) by mouth daily.   Continuous Blood Gluc Sensor (FREESTYLE LIBRE 3 SENSOR) MISC by Does not apply route every 14 (fourteen) days.   FARXIGA 10 MG TABS tablet Take 10 mg by mouth daily.   furosemide (LASIX) 20 MG tablet TAKE 2 TABLETS BY MOUTH IN THE MORNING AND 1 IN THE EVENING   gabapentin (NEURONTIN) 300 MG capsule Take 1 capsule (300 mg total) by mouth 3 (three) times daily.   HUMALOG KWIKPEN 100 UNIT/ML KwikPen Inject into the skin.   HYDROcodone-acetaminophen (NORCO/VICODIN) 5-325 MG tablet Take 1-2 tablets by mouth every 6 (six) hours as needed for moderate pain.   hydroxychloroquine (PLAQUENIL) 200 MG tablet    insulin aspart  (NOVOLOG) 100 UNIT/ML injection Inject 10 Units into the skin 2 (two) times daily with a meal. Per endocrinologist   Insulin Glargine (BASAGLAR KWIKPEN) 100 UNIT/ML 10 units every morning and 24 units nightly   losartan (COZAAR) 100 MG tablet Take 1 tablet (100 mg total) by mouth daily.   meloxicam (MOBIC) 15 MG tablet Take 1 tablet daily as needed.   metFORMIN (GLUCOPHAGE) 1000 MG tablet TAKE 1 TABLET BY MOUTH TWICE DAILY WITH MEALS   metoCLOPramide (REGLAN) 5 MG tablet TAKE 1 TABLET BY MOUTH 4 TIMES DAILY BEFORE MEAL(S) AND AT BEDTIME   metoprolol succinate (TOPROL-XL) 100 MG 24 hr tablet Take 2 tablets by mouth once daily   Multiple Vitamin (MULTIVITAMIN WITH MINERALS) TABS tablet Take 1 tablet by mouth daily.   NOVOLOG FLEXPEN 100 UNIT/ML FlexPen    pantoprazole (PROTONIX) 40 MG tablet TAKE 1 TABLET BY MOUTH ONCE DAILY .   promethazine (PHENERGAN) 12.5 MG tablet TAKE 1 TO 2 TABLETS BY MOUTH EVERY 6 HOURS AS NEEDED FOR NAUSEA   rosuvastatin (CRESTOR) 10 MG tablet Take 1 tablet (10 mg total) by mouth daily.   TRULICITY 1.5 MG/0.5ML SOPN Inject into the skin.   verapamil (CALAN-SR) 240 MG CR tablet Take by mouth.   verapamil (CALAN-SR) 240 MG CR tablet TAKE 1 TABLET BY MOUTH ONCE DAILY AT BEDTIME  VICTOZA 18 MG/3ML SOPN INJECT 1.2MG  INTO THE SKIN DAILY (Patient not taking: Reported on 08/18/2022)   No current facility-administered medications for this visit. (Other)   REVIEW OF SYSTEMS: ROS   Positive for: Endocrine, Eyes Negative for: Constitutional, Gastrointestinal, Neurological, Skin, Genitourinary, Musculoskeletal, HENT, Cardiovascular, Respiratory, Psychiatric, Allergic/Imm, Heme/Lymph Last edited by Etheleen Mayhew, COT on 11/23/2022 12:51 PM.     ALLERGIES Allergies  Allergen Reactions   Gluten Meal Swelling   Lisinopril Cough   Pioglitazone Other (See Comments)    ELEVATED glucoses + worse chronic nausea   PAST MEDICAL HISTORY Past Medical History:  Diagnosis Date    Abdominal bloating    likely from diab gastroparesis.  Dr. Adela Lank started trial of reglan 03/2019.   Benign brain tumor (HCC)    Cystic lesion in cerebral aqueduct region with mild hydrocephalus-- stable MRI 02/2016.  Surveillance MRI 05/2017 --dilated cerebral aqueduct related to aqueductal stenosis and subsequent mild hydrocephalus (due to the 11 mm stable cystic lesion in cerebral aqueduct---?congenitial?.   Cataract    OU   Dysmenorrhea    vicodin occ during first 2 days of cycle.   Fibromyalgia    Gluten intolerance    pt reports she underwent full GI w/u to r/o celiac dz   Hepatic steatosis    ultrasound 08/2017. Hx of very mild elevation of ALT.  Stable on u/s 06/2020   History of adenomatous polyp of colon 04/08/2019   recall Feb 2024   Hyperlipidemia, mixed    Hypertension    +white coat component   Hypertensive retinopathy of both eyes    Insomnia    Iron deficiency 01/2019   Hb 11.3. Hemoccults neg x 3 03/19/19. EGD and colonoscopy 04/08/19 showed NO cause for IDA.  Pt does have menorrhagia, though, so she'll see her GYN.  started FeSO4 325 qd approx 04/14/19.   Menorrhagia    resulting in IDA 2021   PMR (polymyalgia rheumatica) (HCC) 2022   question of; hx of elevated ESR-->better with prednisone but prednisone was contraindicated d/t eye issues/hyperglycemia-->rheum started following her 02/2021->plaquenil 04/2021   Proliferative diabetic retinopathy of both eyes (HCC)    steroid injections 10/2017--improved   Sensorineural hearing loss of left ear    Sudden left hearing loss summer 2016--no improvement with steroids 01/2015 so brain MRI done by Dr. Jenne Pane and it showed brain tumor that was determined to be benign.  Pt's hearing not bad enough for hearing aid as of 06/2016.   Type 2 diabetes with complication (HCC)    +microalbuminuria, diab retpthy, diabetic gastroparesis (gastric emptying study mildly abnl 03/2017).  Recommended lantus 08/2018 but pt declined. Mild  microalbuminuria.   Uterine fibroid 2022   per pt report, pelvic u/s in GYN office   Past Surgical History:  Procedure Laterality Date   ANOSCOPY  05/12/2019   Procedure: normal exam, minimal hemorrhoid disease. Hyertrophied anal papila, benign appearing, posterior midline. Surgeon: Romie Levee MD   CHOLECYSTECTOMY  2000   COLONOSCOPY  04/08/2019   5 adenomas, recall 3 yrs; no cause for IDA found.  Hypertrophied anal papillae->bx showed low grade dysplasia; GI referred her to colorectal surgeon.   ESOPHAGOGASTRODUODENOSCOPY  04/08/2019   mild chronic reactive gastritis. H pylori NEG.  No cause for IDA found.   GAS INSERTION Right 10/31/2018   Procedure: Insertion Of C3F8 Gas;  Surgeon: Rennis Chris, MD;  Location: [facility] OR;  Service: Ophthalmology;  Laterality: Right;   GASTRIC EMPTYING SCAN  04/20/2017   Mildly abnormal, particularly  the 1st hour of emptying.   MEMBRANE PEEL Right 10/31/2018   Procedure: MEMBRANE PEEL;  Surgeon: Rennis Chris, MD;  Location: [facility] OR;  Service: Ophthalmology;  Laterality: Right;   PARS PLANA VITRECTOMY Right 04/12/2018   Procedure: Right PARS PLANA VITRECTOMY WITH 25 GAUGE with intravitreal antibiotics;  Surgeon: Rennis Chris, MD;  Location: [facility] OR;  Service: Ophthalmology;  Laterality: Right;   PARS PLANA VITRECTOMY Right 10/31/2018   Procedure: PARS PLANA VITRECTOMY WITH 25 GAUGE;  Surgeon: Rennis Chris, MD;  Location: [facility] OR;  Service: Ophthalmology;  Laterality: Right;   PHOTOCOAGULATION WITH LASER Right 10/31/2018   Procedure: Photocoagulation With Laser;  Surgeon: Rennis Chris, MD;  Location: [facility] OR;  Service: Ophthalmology;  Laterality: Right;   TRANSTHORACIC ECHOCARDIOGRAM  08/23/2020   Grd I DD, o/w normal.   FAMILY HISTORY Family History  Problem Relation Age of Onset   Brain cancer Mother    Diabetes Father    Diabetes Maternal Grandmother    Cataracts Maternal Grandmother    Cervical cancer Paternal Grandmother    Colon cancer Maternal  Grandfather 55   Amblyopia Neg Hx    Blindness Neg Hx    Glaucoma Neg Hx    Macular degeneration Neg Hx    Retinal detachment Neg Hx    Strabismus Neg Hx    Retinitis pigmentosa Neg Hx    Esophageal cancer Neg Hx    Stomach cancer Neg Hx    Rectal cancer Neg Hx    SOCIAL HISTORY Social History   Tobacco Use   Smoking status: Never   Smokeless tobacco: Never  Vaping Use   Vaping status: Never Used  Substance Use Topics   Alcohol use: No   Drug use: No       OPHTHALMIC EXAM:  Base Eye Exam     Visual Acuity (Snellen - Linear)       Right Left   Dist Powell 20/25 -1    Dist cc  20/40 -2   Dist ph Brandon NI    Dist ph cc  20/25 -1         Tonometry (Tonopen, 12:57 PM)       Right Left   Pressure 17 17         Pupils       Pupils Dark Light Shape React APD   Right PERRL 3 2 Round Brisk None   Left PERRL 3 2 Round Brisk None         Visual Fields       Left Right    Full Full         Extraocular Movement       Right Left    Full, Ortho Full, Ortho         Neuro/Psych     Oriented x3: Yes   Mood/Affect: Normal         Dilation     Both eyes: 2.5% Phenylephrine @ 12:58 PM           Slit Lamp and Fundus Exam     Slit Lamp Exam       Right Left   Lids/Lashes Dermatochalasis - upper lid, mild Meibomian gland dysfunction, Telangiectasia Dermatochalasis - upper lid, Meibomian gland dysfunction, Telangiectasia   Conjunctiva/Sclera White and quiet White and quiet   Cornea Clear, well healed temporal cataract wounds Trace Punctate epithelial erosions, trace EBMD   Anterior Chamber Deep and quiet Deep and quiet   Iris Round and dilated, No NVI Round  and dilated, No NVI   Lens PC IOL in good position, 1-2+ Posterior capsular opacification, PC folds 2-3+ Nuclear sclerosis with mild brunescence, 2-3+ Cortical cataract, 1+ Posterior subcapsular cataract   Anterior Vitreous post vitrectomy, trace pigment Vitreous syneresis, vitreous  condensations, no frank red heme, old white VH clearing and settling inferiorly         Fundus Exam       Right Left   Disc mild Pallor, Sharp rim, temporal PPA Pink and sharp, Compact, PPA   C/D Ratio 0.2 0.0   Macula Flat, good foveal reflex, mild cystic changes temporal macula -- slightly increased, focal MA and exudates ST mac, +focal laser changes Flat, good foveal reflex, trace cystic changes -- stably improved, scattered MA; trace ERM, light focal laser changes   Vessels attenuated, Tortuous attenuated, Tortuous   Periphery Attached, rare MA, scattered DBH greatest posteriorly, 360 PRP, good laser fill in 360 attached, scattered IRH; 360 PRP scars -- with good fill in changes           Refraction     Wearing Rx       Sphere Cylinder   Right -3.75 Sphere   Left -3.75 Sphere           IMAGING AND PROCEDURES  Imaging and Procedures for   OCT, Retina - OU - Both Eyes       Right Eye Quality was good. Central Foveal Thickness: 401. Progression has worsened. Findings include no SRF, abnormal foveal contour, intraretinal hyper-reflective material, epiretinal membrane, intraretinal fluid (Persistent IRF/cystic changes temporal macula and fovea -- slightly increased).   Left Eye Quality was good. Central Foveal Thickness: 279. Progression has been stable. Findings include normal foveal contour, no IRF, no SRF, intraretinal hyper-reflective material (stable improvement in IRF/cystic changes temporal fovea, +vitreous opacities--slightly improved).   Notes *Images captured and stored on drive  Diagnosis / Impression:  +DME OU OD: Persistent IRF/cystic changes temporal macula and fovea -- slightly increased OS: stable improvement in IRF/cystic changes temporal fovea, +vitreous opacities--slightly improved  Clinical management:  See below  Abbreviations: NFP - Normal foveal profile. CME - cystoid macular edema. PED - pigment epithelial detachment. IRF - intraretinal  fluid. SRF - subretinal fluid. EZ - ellipsoid zone. ERM - epiretinal membrane. ORA - outer retinal atrophy. ORT - outer retinal tubulation. SRHM - subretinal hyper-reflective material       Intravitreal Injection, Pharmacologic Agent - OD - Right Eye       Time Out 11/23/2022. 1:21 PM. Confirmed correct patient, procedure, site, and patient consented.   Anesthesia Topical anesthesia was used. Anesthetic medications included Lidocaine 2%, Proparacaine 0.5%.   Procedure Preparation included 5% betadine to ocular surface, eyelid speculum. A (32g) needle was used.   Injection: 8 mg aflibercept 8 MG/0.07ML   Route: Intravitreal, Site: Right Eye   NDC: M6324049, Lot: [id number], Expiration date: 05/28/2023, Waste: 0 mL   Post-op Post injection exam found visual acuity of at least counting fingers. The patient tolerated the procedure well. There were no complications. The patient received written and verbal post procedure care education. Post injection medications were not given.   Notes **SAMPLE MEDICATION ADMINISTERED**     Intravitreal Injection, Pharmacologic Agent - OS - Left Eye       Time Out 11/23/2022. 1:39 PM. Confirmed correct patient, procedure, site, and patient consented.   Anesthesia Topical anesthesia was used. Anesthetic medications included Lidocaine 2%, Proparacaine 0.5%.   Procedure Preparation included 5% betadine to ocular surface, eyelid  speculum. A (32 g) needle was used.   Injection: 2 mg aflibercept 2 MG/0.05ML   Route: Intravitreal, Site: Left Eye   NDC: L6038910, Lot: [id number], Expiration date: 12/28/2023, Waste: 0 mL   Post-op Post injection exam found visual acuity of at least counting fingers. The patient tolerated the procedure well. There were no complications. The patient received written and verbal post procedure care education. Post injection medications were not given.            ASSESSMENT/PLAN:   ICD-10-CM   1.  Proliferative diabetic retinopathy of both eyes with macular edema associated with type 2 diabetes mellitus (HCC)  E11.3513 OCT, Retina - OU - Both Eyes    Intravitreal Injection, Pharmacologic Agent - OD - Right Eye    Intravitreal Injection, Pharmacologic Agent - OS - Left Eye    aflibercept (EYLEA HD) SOLN 8 mg    aflibercept (EYLEA) SOLN 2 mg    2. Current use of insulin (HCC)  Z79.4     3. Long term (current) use of oral hypoglycemic drugs  Z79.84     4. Long-term (current) use of injectable non-insulin antidiabetic drugs  Z79.85     5. Vitreous hemorrhage of left eye (HCC)  H43.12     6. Vitreous hemorrhage, right eye (HCC)  H43.11     7. Hypertensive retinopathy of both eyes  H35.033     8. Essential hypertension  I10     9. Right endophthalmia  H44.001     10. Combined forms of age-related cataract of left eye  H25.812     11. Pseudophakia of right eye  Z96.1      1-4.  Proliferative diabetic retinopathy w/ DME, OU - HbA1c 7.1% (3.23.23), 8.6% (9.14.22), 7.8% (06.13.22), 9.1% (02.24.22) 11.0% (11.22.21) - s/p IVA OD #1 9.20.19, #2 (10.25.19), #3 (11.15.19), #4 (12.17.19), #5 (01.14.20), #6 (2.11.20), #7 (05.29.20), #8 (08.19.20), #9 (10.30.20), #10 (12.09.20), #11 (01.11.21), #12 (02.15.21), #13 (03.23.21), #14 (04.20.21), #15 (06.08.21), #16 (07.06.21) -- IVA resistance - s/p IVA OS #1 9.27.19, #2 (10.25.19), #3 (11.15.19), #4 (12.17.19), #5 (01.14.20), #6 (2.11.20), #7 (04.26.20), #8 (05.29.20), #9 (06.26.20), #10 (08.05.20), #11 (11.11.20), #12 (12.09.20), #13 (01.11.21), #14 (02.15.21), #15 (03.23.21), #16 (04.20.21), #17 (06.08.21) -- IVA resistance, #18 (09.27.21) ============================================================ - s/p IVE OD #1 (08.03.21) -- sample, #2 (09.10.21), #3 (10.08.21), #4 (11.23.21), #5 (12.21.21), #6 (01.19.22) sample, #7 (2.16.22), #8 (3.22.22), #9 (04.19.22), #10 (05.27.22), #11 (06.29.22), #12 (08.03.22), #13 (09.07.22), #14 (10.10.22), #15  (11.14.22), #16 (12.12.22), #17 (01.10.23) - sample, #18 (02.07.23), #19 (03.21.23), #20 (04.18.23), #21 (05.16.23) -- IVE resistance - s/p IVE OS #1 (10.25.21), #2 (11.23.21), #3 (12.21.21), #4 (3.22.22), #5 (06.29.22), #6 (08.03.22), #7 (09.07.22), #8 (10.10.22), #9 (11.14.22), #10 (02.07.23), #11 (05.16.23), #12 (08.15.23), #13 (11.14.23), #14 (02.06.24), #15 (05.06.24), #16 SAMPLE (07.01.24) ============================================================ - IVV OD #1 (06.16.23 -- sample), #2 (07.17.23), #3 (08.15.23), #4 (09.12.23), #5 (10.10.23), #6 (11.14.23), #7 (12.12.23), #8 (01.09.23), #9 (02.06.24), #10 (03.11.24), #11 (04.08.24), #12 (05.06.24), #13 (06.03.24), #14 (07.01.24), #15 (07.29.24), #16 (08.26.24)  - S/P PRP OS (09.20.19), (5.19.20), (08.19.20), (04.28.21), (02.02.22)  - S/P PRP OD (9.27.19 and 11.21.19), fill-in (04.14.20) (09.03.20, surgery)  - S/P focal laser OS (07.06.21), OD (09.20.22)  - FA (9.20.19) shows +NVE OU and leaking MA and capillary nonperfusion  - repeat FA 11.15.19 shows NV regressing OU - pre-op: OD w/ VA down at 20/25, but there is some preretinal fibrosis / tractional membranes just superior to disc and mild central DME  - s/p  25g PPV+MP+10% C3F8 gas OD (09.03.20) -- ERM/PRF removal OD  - BCVA: stable at 20/25 OU             - fibrosis/ERM stably improved; retina attached - OCT shows OD: Interval increase in IRF/cystic changes temporal macula and fovea; OS: stable improvement in IRF/cystic changes temporal fovea, +vitreous opacities at 4 weeks **discussed decreased efficacy / resistance to Vabysmo OD and potential benefit of switching medication**  - recommend switching to IVE HD OD #1 (sample) and IVE OS #17 today 09.26.24 with follow up in 4 weeks - **OS on ~q82m maintenance treatment schedule**  - pt in agreement  - RBA of procedure discussed, questions answered - see procedure note  - Eylea informed consent form re-signed and scanned on 05.16.2023  -  Vabysmo informed consent form signed on 06.16.23  - Eylea HD approved for 2024  - f/u 4 weeks, DFE, OCT, possible injection(s)  5. Vitreous hemorrhage OS -- stably improved  - recurrent VH, onset 09.22.21  - etiology: secondary to PDR as described above (no RT/RD on exam)  - s/p PRP OS (9.20.19), (05.19.20), (08.19.20), (04.28.21), (02.02.22) - s/p IVA OS on 4.26.20, 5.29.20, 6.26.20, 8.5.20,11.11.20, 12.06.20 and so on as above   - Vit condensations slightly increasede  - BCVA stable at 20/25  - IVEs as above  - f/u 4 weeks DFE, OCT  6. History of Endophthalmitis OD  - s/p IVA OU 04/09/2018  - s/p 25g PPV w/ intravitreal vanc, ceftaz and cefepime OD, 2.14.2020  - s/p intravitreal tap / vanc and ceflaz injections (02.16.20)             - gram stain (2.14.20) shows G+ cocci, WBCs mostly PMNs;   - repeat gram stain from t/i (2.16.20) -- no organisms, just WBCs             - cultures from vitreous grew rare Staph warneri; cultures from t/i -- no growth             - doing well, BCVA 20/25             - inflammation/posterior debris resolved  - monitor  7. History of Vitreous Hemorrhage OD -- cleared from PPV x2 for endophthalmitis and ERM/preretinal fibrosis   - secondary to PDR  8,9. Hypertensive retinopathy OU  - discussed importance of tight BP control.  - monitor  10. Combined form age related cataract OS - The symptoms of cataract, surgical options, and treatments and risks were discussed with patient.   - discussed diagnosis and progression  11. Pseudophakia OD  - s/p CE/IOL OD (Dr. Laruth Bouchard, 12.11.20)  - beautiful surgeries, doing well  Ophthalmic Meds Ordered this visit:  Meds ordered this encounter  Medications   aflibercept (EYLEA HD) SOLN 8 mg   aflibercept (EYLEA) SOLN 2 mg     Return in about 4 weeks (around 12/21/2022) for f/u PDR OU, DFE, OCT.  There are no Patient Instructions on file for this visit.  This document serves as a record of services  personally performed by Karie Chimera, MD, PhD. It was created on their behalf by Glee Arvin. Manson Passey, OA an ophthalmic technician. The creation of this record is the provider's dictation and/or activities during the visit.    Electronically signed by: Glee Arvin. Manson Passey, OA 11/23/22 5:09 PM  Karie Chimera, M.D., Ph.D. Diseases & Surgery of the Retina and Vitreous [facility]  I have reviewed the above documentation for  accuracy and completeness, and I agree with the above. Karie Chimera, M.D., Ph.D. 11/23/22 5:10 PM   Abbreviations: M myopia (nearsighted); A astigmatism; H hyperopia (farsighted); P presbyopia; Mrx spectacle prescription;  CTL contact lenses; OD right eye; OS left eye; OU both eyes  XT exotropia; ET esotropia; PEK punctate epithelial keratitis; PEE punctate epithelial erosions; DES dry eye syndrome; MGD meibomian gland dysfunction; ATs artificial tears; PFAT's preservative free artificial tears; NSC nuclear sclerotic cataract; PSC posterior subcapsular cataract; ERM epi-retinal membrane; PVD posterior vitreous detachment; RD retinal detachment; DM diabetes mellitus; DR diabetic retinopathy; NPDR non-proliferative diabetic retinopathy; PDR proliferative diabetic retinopathy; CSME clinically significant macular edema; DME diabetic macular edema; dbh dot blot hemorrhages; CWS cotton wool spot; POAG primary open angle glaucoma; C/D cup-to-disc ratio; HVF humphrey visual field; GVF goldmann visual field; OCT optical coherence tomography; IOP intraocular pressure; BRVO Branch retinal vein occlusion; CRVO central retinal vein occlusion; CRAO central retinal artery occlusion; BRAO branch retinal artery occlusion; RT retinal tear; SB scleral buckle; PPV pars plana vitrectomy; VH Vitreous hemorrhage; PRP panretinal laser photocoagulation; IVK intravitreal kenalog; VMT vitreomacular traction; MH Macular hole;  NVD neovascularization of the disc; NVE neovascularization  elsewhere; AREDS age related eye disease study; ARMD age related macular degeneration; POAG primary open angle glaucoma; EBMD epithelial/anterior basement membrane dystrophy; ACIOL anterior chamber intraocular lens; IOL intraocular lens; PCIOL posterior chamber intraocular lens; Phaco/IOL phacoemulsification with intraocular lens placement; PRK photorefractive keratectomy; LASIK laser assisted in situ keratomileusis; HTN hypertension; DM diabetes mellitus; COPD chronic obstructive pulmonary disease

## 2022-11-23 ENCOUNTER — Encounter (INDEPENDENT_AMBULATORY_CARE_PROVIDER_SITE_OTHER): Payer: Self-pay | Admitting: Ophthalmology

## 2022-11-23 ENCOUNTER — Ambulatory Visit (INDEPENDENT_AMBULATORY_CARE_PROVIDER_SITE_OTHER): Payer: Medicare Other | Admitting: Ophthalmology

## 2022-11-23 DIAGNOSIS — Z794 Long term (current) use of insulin: Secondary | ICD-10-CM

## 2022-11-23 DIAGNOSIS — H44001 Unspecified purulent endophthalmitis, right eye: Secondary | ICD-10-CM

## 2022-11-23 DIAGNOSIS — H4311 Vitreous hemorrhage, right eye: Secondary | ICD-10-CM

## 2022-11-23 DIAGNOSIS — Z961 Presence of intraocular lens: Secondary | ICD-10-CM

## 2022-11-23 DIAGNOSIS — I10 Essential (primary) hypertension: Secondary | ICD-10-CM

## 2022-11-23 DIAGNOSIS — Z7985 Long-term (current) use of injectable non-insulin antidiabetic drugs: Secondary | ICD-10-CM

## 2022-11-23 DIAGNOSIS — H25812 Combined forms of age-related cataract, left eye: Secondary | ICD-10-CM

## 2022-11-23 DIAGNOSIS — H4312 Vitreous hemorrhage, left eye: Secondary | ICD-10-CM

## 2022-11-23 DIAGNOSIS — E113513 Type 2 diabetes mellitus with proliferative diabetic retinopathy with macular edema, bilateral: Secondary | ICD-10-CM | POA: Diagnosis not present

## 2022-11-23 DIAGNOSIS — H35033 Hypertensive retinopathy, bilateral: Secondary | ICD-10-CM

## 2022-11-23 DIAGNOSIS — Z7984 Long term (current) use of oral hypoglycemic drugs: Secondary | ICD-10-CM | POA: Diagnosis not present

## 2022-11-23 DIAGNOSIS — H4313 Vitreous hemorrhage, bilateral: Secondary | ICD-10-CM

## 2022-11-23 MED ORDER — AFLIBERCEPT 2MG/0.05ML IZ SOLN FOR KALEIDOSCOPE
2.0000 mg | INTRAVITREAL | Status: AC | PRN
Start: 2022-11-23 — End: 2022-11-23
  Administered 2022-11-23: 2 mg via INTRAVITREAL

## 2022-11-23 MED ORDER — AFLIBERCEPT 8 MG/0.07ML IZ SOLN
8.0000 mg | INTRAVITREAL | Status: AC | PRN
Start: 2022-11-23 — End: 2022-11-23
  Administered 2022-11-23: 8 mg via INTRAVITREAL

## 2022-12-05 DIAGNOSIS — E1165 Type 2 diabetes mellitus with hyperglycemia: Secondary | ICD-10-CM | POA: Diagnosis not present

## 2022-12-12 DIAGNOSIS — I10 Essential (primary) hypertension: Secondary | ICD-10-CM | POA: Diagnosis not present

## 2022-12-12 DIAGNOSIS — E1165 Type 2 diabetes mellitus with hyperglycemia: Secondary | ICD-10-CM | POA: Diagnosis not present

## 2022-12-12 DIAGNOSIS — E785 Hyperlipidemia, unspecified: Secondary | ICD-10-CM | POA: Diagnosis not present

## 2022-12-21 NOTE — Progress Notes (Signed)
[facility] - Clinic Note  12/22/2022     CHIEF COMPLAINT Patient presents for Retina Follow Up  HISTORY OF PRESENT ILLNESS: Gloria Gomez is a 52 y.o. female who presents to the clinic today for:  HPI     Retina Follow Up   Patient presents with  Diabetic Retinopathy.  In both eyes.  This started 4 weeks ago.  I, the attending physician,  performed the HPI with the patient and updated documentation appropriately.        Comments   Patient here for 4 weeks retina follow up for PDR OU. Patient states vision doing pretty good. About the same. Has new glasses. Had no floaters for weird lines. A1C 7.9. not using drops.       Last edited by Rennis Chris, MD on 12/22/2022  3:41 PM.    Patient states her BP has been higher this week. Otherwise her health is stable.   Referring physician:   HISTORICAL INFORMATION:   Selected notes from the MEDICAL RECORD NUMBER Referred for DM exam   CURRENT MEDICATIONS: No current outpatient medications on file. (Ophthalmic Drugs)   No current facility-administered medications for this visit. (Ophthalmic Drugs)   Current Outpatient Medications (Other)  Medication Sig   amLODipine (NORVASC) 5 MG tablet Take 1 tablet (5 mg total) by mouth daily.   Continuous Blood Gluc Sensor (FREESTYLE LIBRE 3 SENSOR) MISC by Does not apply route every 14 (fourteen) days.   FARXIGA 10 MG TABS tablet Take 10 mg by mouth daily.   furosemide (LASIX) 20 MG tablet TAKE 2 TABLETS BY MOUTH IN THE MORNING AND 1 IN THE EVENING   gabapentin (NEURONTIN) 300 MG capsule Take 1 capsule (300 mg total) by mouth 3 (three) times daily.   HUMALOG KWIKPEN 100 UNIT/ML KwikPen Inject into the skin.   HYDROcodone-acetaminophen (NORCO/VICODIN) 5-325 MG tablet Take 1-2 tablets by mouth every 6 (six) hours as needed for moderate pain.   hydroxychloroquine (PLAQUENIL) 200 MG tablet    insulin aspart (NOVOLOG) 100 UNIT/ML injection Inject 10 Units into the skin  2 (two) times daily with a meal. Per endocrinologist   Insulin Glargine (BASAGLAR KWIKPEN) 100 UNIT/ML 10 units every morning and 24 units nightly   losartan (COZAAR) 100 MG tablet Take 1 tablet (100 mg total) by mouth daily.   meloxicam (MOBIC) 15 MG tablet Take 1 tablet daily as needed.   metFORMIN (GLUCOPHAGE) 1000 MG tablet TAKE 1 TABLET BY MOUTH TWICE DAILY WITH MEALS   metoCLOPramide (REGLAN) 5 MG tablet TAKE 1 TABLET BY MOUTH 4 TIMES DAILY BEFORE MEAL(S) AND AT BEDTIME   metoprolol succinate (TOPROL-XL) 100 MG 24 hr tablet Take 2 tablets by mouth once daily   Multiple Vitamin (MULTIVITAMIN WITH MINERALS) TABS tablet Take 1 tablet by mouth daily.   NOVOLOG FLEXPEN 100 UNIT/ML FlexPen    pantoprazole (PROTONIX) 40 MG tablet TAKE 1 TABLET BY MOUTH ONCE DAILY .   promethazine (PHENERGAN) 12.5 MG tablet TAKE 1 TO 2 TABLETS BY MOUTH EVERY 6 HOURS AS NEEDED FOR NAUSEA   rosuvastatin (CRESTOR) 10 MG tablet Take 1 tablet (10 mg total) by mouth daily.   TRULICITY 1.5 MG/0.5ML SOPN Inject into the skin.   verapamil (CALAN-SR) 240 MG CR tablet Take by mouth.   verapamil (CALAN-SR) 240 MG CR tablet TAKE 1 TABLET BY MOUTH ONCE DAILY AT BEDTIME   VICTOZA 18 MG/3ML SOPN INJECT 1.2MG  INTO THE SKIN DAILY (Patient not taking: Reported on 08/18/2022)   No  current facility-administered medications for this visit. (Other)   REVIEW OF SYSTEMS: ROS   Positive for: Endocrine, Eyes Negative for: Constitutional, Gastrointestinal, Neurological, Skin, Genitourinary, Musculoskeletal, HENT, Cardiovascular, Respiratory, Psychiatric, Allergic/Imm, Heme/Lymph Last edited by Laddie Aquas, COA on 12/22/2022  1:15 PM.      ALLERGIES Allergies  Allergen Reactions   Gluten Meal Swelling   Lisinopril Cough   Pioglitazone Other (See Comments)    ELEVATED glucoses + worse chronic nausea   PAST MEDICAL HISTORY Past Medical History:  Diagnosis Date   Abdominal bloating    likely from diab gastroparesis.  Dr.  Adela Lank started trial of reglan 03/2019.   Benign brain tumor (HCC)    Cystic lesion in cerebral aqueduct region with mild hydrocephalus-- stable MRI 02/2016.  Surveillance MRI 05/2017 --dilated cerebral aqueduct related to aqueductal stenosis and subsequent mild hydrocephalus (due to the 11 mm stable cystic lesion in cerebral aqueduct---?congenitial?.   Cataract    OU   Dysmenorrhea    vicodin occ during first 2 days of cycle.   Fibromyalgia    Gluten intolerance    pt reports she underwent full GI w/u to r/o celiac dz   Hepatic steatosis    ultrasound 08/2017. Hx of very mild elevation of ALT.  Stable on u/s 06/2020   History of adenomatous polyp of colon 04/08/2019   recall Feb 2024   Hyperlipidemia, mixed    Hypertension    +white coat component   Hypertensive retinopathy of both eyes    Insomnia    Iron deficiency 01/2019   Hb 11.3. Hemoccults neg x 3 03/19/19. EGD and colonoscopy 04/08/19 showed NO cause for IDA.  Pt does have menorrhagia, though, so she'll see her GYN.  started FeSO4 325 qd approx 04/14/19.   Menorrhagia    resulting in IDA 2021   PMR (polymyalgia rheumatica) (HCC) 2022   question of; hx of elevated ESR-->better with prednisone but prednisone was contraindicated d/t eye issues/hyperglycemia-->rheum started following her 02/2021->plaquenil 04/2021   Proliferative diabetic retinopathy of both eyes (HCC)    steroid injections 10/2017--improved   Sensorineural hearing loss of left ear    Sudden left hearing loss summer 2016--no improvement with steroids 01/2015 so brain MRI done by Dr. Jenne Pane and it showed brain tumor that was determined to be benign.  Pt's hearing not bad enough for hearing aid as of 06/2016.   Type 2 diabetes with complication (HCC)    +microalbuminuria, diab retpthy, diabetic gastroparesis (gastric emptying study mildly abnl 03/2017).  Recommended lantus 08/2018 but pt declined. Mild microalbuminuria.   Uterine fibroid 2022   per pt report, pelvic u/s in GYN  office   Past Surgical History:  Procedure Laterality Date   ANOSCOPY  05/12/2019   Procedure: normal exam, minimal hemorrhoid disease. Hyertrophied anal papila, benign appearing, posterior midline. Surgeon: Romie Levee MD   CHOLECYSTECTOMY  2000   COLONOSCOPY  04/08/2019   5 adenomas, recall 3 yrs; no cause for IDA found.  Hypertrophied anal papillae->bx showed low grade dysplasia; GI referred her to colorectal surgeon.   ESOPHAGOGASTRODUODENOSCOPY  04/08/2019   mild chronic reactive gastritis. H pylori NEG.  No cause for IDA found.   GAS INSERTION Right 10/31/2018   Procedure: Insertion Of C3F8 Gas;  Surgeon: Rennis Chris, MD;  Location: [facility] OR;  Service: Ophthalmology;  Laterality: Right;   GASTRIC EMPTYING SCAN  04/20/2017   Mildly abnormal, particularly the 1st hour of emptying.   MEMBRANE PEEL Right 10/31/2018   Procedure: MEMBRANE PEEL;  Surgeon: Rennis Chris, MD;  Location: [facility] OR;  Service: Ophthalmology;  Laterality: Right;   PARS PLANA VITRECTOMY Right 04/12/2018   Procedure: Right PARS PLANA VITRECTOMY WITH 25 GAUGE with intravitreal antibiotics;  Surgeon: Rennis Chris, MD;  Location: [facility] OR;  Service: Ophthalmology;  Laterality: Right;   PARS PLANA VITRECTOMY Right 10/31/2018   Procedure: PARS PLANA VITRECTOMY WITH 25 GAUGE;  Surgeon: Rennis Chris, MD;  Location: [facility] OR;  Service: Ophthalmology;  Laterality: Right;   PHOTOCOAGULATION WITH LASER Right 10/31/2018   Procedure: Photocoagulation With Laser;  Surgeon: Rennis Chris, MD;  Location[facility] OR;  Service: Ophthalmology;  Laterality: Right;   TRANSTHORACIC ECHOCARDIOGRAM  08/23/2020   Grd I DD, o/w normal.   FAMILY HISTORY Family History  Problem Relation Age of Onset   Brain cancer Mother    Diabetes Father    Diabetes Maternal Grandmother    Cataracts Maternal Grandmother    Cervical cancer Paternal Grandmother    Colon cancer Maternal Grandfather 70   Amblyopia Neg Hx    Blindness Neg Hx    Glaucoma Neg Hx     Macular degeneration Neg Hx    Retinal detachment Neg Hx    Strabismus Neg Hx    Retinitis pigmentosa Neg Hx    Esophageal cancer Neg Hx    Stomach cancer Neg Hx    Rectal cancer Neg Hx    SOCIAL HISTORY Social History   Tobacco Use   Smoking status: Never   Smokeless tobacco: Never  Vaping Use   Vaping status: Never Used  Substance Use Topics   Alcohol use: No   Drug use: No       OPHTHALMIC EXAM:  Base Eye Exam     Visual Acuity (Snellen - Linear)       Right Left   Dist cc 20/25 -1 20/25 +1   Dist ph cc NI NI    Correction: Glasses         Tonometry (Tonopen, 1:12 PM)       Right Left   Pressure 22 15         Pupils       Dark Light Shape React APD   Right 3 2 Round Brisk None   Left 3 2 Round Brisk None         Visual Fields (Counting fingers)       Left Right    Full Full         Extraocular Movement       Right Left    Full, Ortho Full, Ortho         Neuro/Psych     Oriented x3: Yes   Mood/Affect: Normal         Dilation     Both eyes: 1.0% Mydriacyl, 2.5% Phenylephrine @ 1:11 PM           Slit Lamp and Fundus Exam     Slit Lamp Exam       Right Left   Lids/Lashes Dermatochalasis - upper lid, mild Meibomian gland dysfunction, Telangiectasia Dermatochalasis - upper lid, Meibomian gland dysfunction, Telangiectasia   Conjunctiva/Sclera White and quiet White and quiet   Cornea Clear, well healed temporal cataract wounds Trace Punctate epithelial erosions, trace EBMD   Anterior Chamber Deep and quiet Deep and quiet   Iris Round and dilated, No NVI Round and dilated, No NVI   Lens PC IOL in good position, 1-2+ Posterior capsular opacification, PC folds 2-3+ Nuclear sclerosis with mild brunescence, 2-3+  Cortical cataract, 1+ Posterior subcapsular cataract   Anterior Vitreous post vitrectomy, trace pigment Vitreous syneresis, vitreous condensations, no frank red heme, old white VH clearing and settled inferiorly          Fundus Exam       Right Left   Disc mild Pallor, Sharp rim, temporal PPA Pink and sharp, Compact, PPA   C/D Ratio 0.2 0.0   Macula Flat, good foveal reflex, mild cystic changes temporal macula -- slightly improved, focal MA and exudates ST mac, +focal laser changes Flat, good foveal reflex, trace cystic changes -- stably improved, scattered MA; trace ERM, light focal laser changes   Vessels attenuated, Tortuous attenuated, Tortuous   Periphery Attached, rare MA, scattered DBH greatest posteriorly, 360 PRP, good laser fill in 360 attached, scattered IRH; 360 PRP scars -- with good fill in changes           Refraction     Wearing Rx       Sphere Cylinder   Right -0.25 Sphere   Left -4.50 Sphere           IMAGING AND PROCEDURES  Imaging and Procedures for   OCT, Retina - OU - Both Eyes       Right Eye Quality was good. Central Foveal Thickness: 321. Progression has improved. Findings include no SRF, abnormal foveal contour, intraretinal hyper-reflective material, epiretinal membrane, intraretinal fluid (Persistent IRF/cystic changes temporal macula and fovea -- slightly improved).   Left Eye Quality was good. Central Foveal Thickness: 275. Progression has been stable. Findings include normal foveal contour, no IRF, no SRF, intraretinal hyper-reflective material (stable improvement in IRF/cystic changes temporal fovea, +vitreous opacities--stably improved).   Notes *Images captured and stored on drive  Diagnosis / Impression:  +DME OU OD: Persistent IRF/cystic changes temporal macula and fovea -- slightly improved OS: stable improvement in IRF/cystic changes temporal fovea, +vitreous opacities--stably improved  Clinical management:  See below  Abbreviations: NFP - Normal foveal profile. CME - cystoid macular edema. PED - pigment epithelial detachment. IRF - intraretinal fluid. SRF - subretinal fluid. EZ - ellipsoid zone. ERM - epiretinal membrane. ORA - outer  retinal atrophy. ORT - outer retinal tubulation. SRHM - subretinal hyper-reflective material       Intravitreal Injection, Pharmacologic Agent - OD - Right Eye       Time Out 12/22/2022. 1:55 PM. Confirmed correct patient, procedure, site, and patient consented.   Anesthesia Topical anesthesia was used. Anesthetic medications included Lidocaine 2%, Proparacaine 0.5%.   Procedure Preparation included 5% betadine to ocular surface, eyelid speculum. A (32g) needle was used.   Injection: 8 mg aflibercept 8 MG/0.07ML   Route: Intravitreal, Site: Right Eye   NDC: [id number], Lot: [id number], Expiration date: 01/27/2024, Waste: 0 mL   Post-op Post injection exam found visual acuity of at least counting fingers. The patient tolerated the procedure well. There were no complications. The patient received written and verbal post procedure care education. Post injection medications were not given.            ASSESSMENT/PLAN:   ICD-10-CM   1. Proliferative diabetic retinopathy of both eyes with macular edema associated with type 2 diabetes mellitus (HCC)  E11.3513 OCT, Retina - OU - Both Eyes    Intravitreal Injection, Pharmacologic Agent - OD - Right Eye    aflibercept (EYLEA HD) ophthalmic injection 8 mg    CANCELED: Intravitreal Injection, Pharmacologic Agent - OS - Left Eye    2. Current use of insulin (HCC)  Z79.4     3. Long term (current) use of oral hypoglycemic drugs  Z79.84     4. Long-term (current) use of injectable non-insulin antidiabetic drugs  Z79.85     5. Vitreous hemorrhage of left eye (HCC)  H43.12     6. Vitreous hemorrhage, right eye (HCC)  H43.11     7. Hypertensive retinopathy of both eyes  H35.033     8. Essential hypertension  I10     9. Right endophthalmia  H44.001     10. Combined forms of age-related cataract of left eye  H25.812     11. Pseudophakia of right eye  Z96.1      1-4.  Proliferative diabetic retinopathy w/ DME, OU - HbA1c 7.9  (10.08.24),8.6 (06.11.24), 7.1 (3.23.23), 8.6 (9.14.22), 7.8 (06.13.22) - s/p IVA OD #1 9.20.19, #2 (10.25.19), #3 (11.15.19), #4 (12.17.19), #5 (01.14.20), #6 (2.11.20), #7 (05.29.20), #8 (08.19.20), #9 (10.30.20), #10 (12.09.20), #11 (01.11.21), #12 (02.15.21), #13 (03.23.21), #14 (04.20.21), #15 (06.08.21), #16 (07.06.21) -- IVA resistance - s/p IVA OS #1 9.27.19, #2 (10.25.19), #3 (11.15.19), #4 (12.17.19), #5 (01.14.20), #6 (2.11.20), #7 (04.26.20), #8 (05.29.20), #9 (06.26.20), #10 (08.05.20), #11 (11.11.20), #12 (12.09.20), #13 (01.11.21), #14 (02.15.21), #15 (03.23.21), #16 (04.20.21), #17 (06.08.21) -- IVA resistance, #18 (09.27.21) ============================================================ - s/p IVE OD #1 (08.03.21) -- sample, #2 (09.10.21), #3 (10.08.21), #4 (11.23.21), #5 (12.21.21), #6 (01.19.22) sample, #7 (2.16.22), #8 (3.22.22), #9 (04.19.22), #10 (05.27.22), #11 (06.29.22), #12 (08.03.22), #13 (09.07.22), #14 (10.10.22), #15 (11.14.22), #16 (12.12.22), #17 (01.10.23) - sample, #18 (02.07.23), #19 (03.21.23), #20 (04.18.23), #21 (05.16.23) -- IVE resistance ============================================================= - s/p IVE OS #1 (10.25.21), #2 (11.23.21), #3 (12.21.21), #4 (3.22.22), #5 (06.29.22), #6 (08.03.22), #7 (09.07.22), #8 (10.10.22), #9 (11.14.22), #10 (02.07.23), #11 (05.16.23), #12 (08.15.23), #13 (11.14.23), #14 (02.06.24), #15 (05.06.24), #16 SAMPLE (07.01.24), #17 (09.26.24) ============================================================ - IVV OD #1 (06.16.23 -- sample), #2 (07.17.23), #3 (08.15.23), #4 (09.12.23), #5 (10.10.23), #6 (11.14.23), #7 (12.12.23), #8 (01.09.23), #9 (02.06.24), #10 (03.11.24), #11 (04.08.24), #12 (05.06.24), #13 (06.03.24), #14 (07.01.24), #15 (07.29.24), #16 (08.26.24)- IVV resistance ============================================================== - s/p IVE HD OD #1 (sample 09.26.24)  - S/P PRP OS (09.20.19), (5.19.20), (08.19.20), (04.28.21),  (02.02.22)  - S/P PRP OD (9.27.19 and 11.21.19), fill-in (04.14.20) (09.03.20, surgery)  - S/P focal laser OS (07.06.21), OD (09.20.22)  - FA (9.20.19) shows +NVE OU and leaking MA and capillary nonperfusion  - repeat FA 11.15.19 shows NV regressing OU - pre-op: OD w/ VA down at 20/25, but there is some preretinal fibrosis / tractional membranes just superior to disc and mild central DME  - s/p 25g PPV+MP+10% C3F8 gas OD (09.03.20) -- ERM/PRF removal OD  - BCVA: stable at 20/25 OU             - fibrosis/ERM stably improved; retina attached - OCT shows OD: Persistent IRF/cystic changes temporal macula and fovea -- slightly improved; OS: stable improvement in IRF/cystic changes temporal fovea, +vitreous opacities--stably improved at 4 weeks **good initial response to IVE HD** - recommend  IVE HD OD #2 today 10.25.24 with follow up in 4 weeks - will hold tx in OS today - pt in agreement **OS on ~q33m maintenance treatment schedule**  - RBA of procedure discussed, questions answered - see procedure note  - Eylea informed consent form re-signed and scanned on 05.16.2023  - Vabysmo informed consent form signed on 06.16.23  [facility] HD informed consent obtained, resigned, and scanned on 10.25.24  - Eylea HD approved for 2024  - f/u  4 weeks, DFE, OCT, possible injection(s)  5. Vitreous hemorrhage OS -- stably improved  - recurrent VH, onset 09.22.21  - etiology: secondary to PDR as described above (no RT/RD on exam)  - s/p PRP OS (9.20.19), (05.19.20), (08.19.20), (04.28.21), (02.02.22) - s/p IVA OS on 4.26.20, 5.29.20, 6.26.20, 8.5.20,11.11.20, 12.06.20 and so on as above   - Vit condensations slightly increasede  - BCVA stable at 20/25  - IVEs as above  - f/u 4 weeks DFE, OCT  6. History of Endophthalmitis OD  - s/p IVA OU 04/09/2018  - s/p 25g PPV w/ intravitreal vanc, ceftaz and cefepime OD, 2.14.2020  - s/p intravitreal tap / vanc and ceflaz injections (02.16.20)             - gram  stain (2.14.20) shows G+ cocci, WBCs mostly PMNs;   - repeat gram stain from t/i (2.16.20) -- no organisms, just WBCs             - cultures from vitreous grew rare Staph warneri; cultures from t/i -- no growth             - doing well, BCVA 20/25             - inflammation/posterior debris resolved  - monitor  7. History of Vitreous Hemorrhage OD -- cleared from PPV x2 for endophthalmitis and ERM/preretinal fibrosis   - secondary to PDR  8,9. Hypertensive retinopathy OU  - discussed importance of tight BP control.  - monitor  10. Combined form age related cataract OS - The symptoms of cataract, surgical options, and treatments and risks were discussed with patient.   - discussed diagnosis and progression  11. Pseudophakia OD  - s/p CE/IOL OD (Dr. Laruth Bouchard, 12.11.20)  - beautiful surgeries, doing well  Ophthalmic Meds Ordered this visit:  Meds ordered this encounter  Medications   aflibercept (EYLEA HD) ophthalmic injection 8 mg     Return in about 4 weeks (around 01/19/2023) for f/u PDR OU, DFE, OCT.  There are no Patient Instructions on file for this visit.  This document serves as a record of services personally performed by Karie Chimera, MD, PhD. It was created on their behalf by Charlette Caffey, COT an ophthalmic technician. The creation of this record is the provider's dictation and/or activities during the visit.    Electronically signed by:  Charlette Caffey, COT  12/22/22 3:43 PM  Karie Chimera, M.D., Ph.D. Diseases & Surgery of the Retina and Vitreous [facility] 12/22/2022   I have reviewed the above documentation for accuracy and completeness, and I agree with the above. Karie Chimera, M.D., Ph.D. 12/22/22 3:45 PM   Abbreviations: M myopia (nearsighted); A astigmatism; H hyperopia (farsighted); P presbyopia; Mrx spectacle prescription;  CTL contact lenses; OD right eye; OS left eye; OU both eyes  XT exotropia; ET esotropia;  PEK punctate epithelial keratitis; PEE punctate epithelial erosions; DES dry eye syndrome; MGD meibomian gland dysfunction; ATs artificial tears; PFAT's preservative free artificial tears; NSC nuclear sclerotic cataract; PSC posterior subcapsular cataract; ERM epi-retinal membrane; PVD posterior vitreous detachment; RD retinal detachment; DM diabetes mellitus; DR diabetic retinopathy; NPDR non-proliferative diabetic retinopathy; PDR proliferative diabetic retinopathy; CSME clinically significant macular edema; DME diabetic macular edema; dbh dot blot hemorrhages; CWS cotton wool spot; POAG primary open angle glaucoma; C/D cup-to-disc ratio; HVF humphrey visual field; GVF goldmann visual field; OCT optical coherence tomography; IOP intraocular pressure; BRVO Branch retinal vein occlusion; CRVO  central retinal vein occlusion; CRAO central retinal artery occlusion; BRAO branch retinal artery occlusion; RT retinal tear; SB scleral buckle; PPV pars plana vitrectomy; VH Vitreous hemorrhage; PRP panretinal laser photocoagulation; IVK intravitreal kenalog; VMT vitreomacular traction; MH Macular hole;  NVD neovascularization of the disc; NVE neovascularization elsewhere; AREDS age related eye disease study; ARMD age related macular degeneration; POAG primary open angle glaucoma; EBMD epithelial/anterior basement membrane dystrophy; ACIOL anterior chamber intraocular lens; IOL intraocular lens; PCIOL posterior chamber intraocular lens; Phaco/IOL phacoemulsification with intraocular lens placement; PRK photorefractive keratectomy; LASIK laser assisted in situ keratomileusis; HTN hypertension; DM diabetes mellitus; COPD chronic obstructive pulmonary disease

## 2022-12-22 ENCOUNTER — Ambulatory Visit (INDEPENDENT_AMBULATORY_CARE_PROVIDER_SITE_OTHER): Payer: Medicare Other | Admitting: Ophthalmology

## 2022-12-22 ENCOUNTER — Encounter (INDEPENDENT_AMBULATORY_CARE_PROVIDER_SITE_OTHER): Payer: Self-pay | Admitting: Ophthalmology

## 2022-12-22 DIAGNOSIS — H4312 Vitreous hemorrhage, left eye: Secondary | ICD-10-CM

## 2022-12-22 DIAGNOSIS — Z794 Long term (current) use of insulin: Secondary | ICD-10-CM | POA: Diagnosis not present

## 2022-12-22 DIAGNOSIS — Z961 Presence of intraocular lens: Secondary | ICD-10-CM

## 2022-12-22 DIAGNOSIS — H44001 Unspecified purulent endophthalmitis, right eye: Secondary | ICD-10-CM

## 2022-12-22 DIAGNOSIS — Z7984 Long term (current) use of oral hypoglycemic drugs: Secondary | ICD-10-CM | POA: Diagnosis not present

## 2022-12-22 DIAGNOSIS — I10 Essential (primary) hypertension: Secondary | ICD-10-CM

## 2022-12-22 DIAGNOSIS — E113513 Type 2 diabetes mellitus with proliferative diabetic retinopathy with macular edema, bilateral: Secondary | ICD-10-CM

## 2022-12-22 DIAGNOSIS — Z7985 Long-term (current) use of injectable non-insulin antidiabetic drugs: Secondary | ICD-10-CM | POA: Diagnosis not present

## 2022-12-22 DIAGNOSIS — H4311 Vitreous hemorrhage, right eye: Secondary | ICD-10-CM

## 2022-12-22 DIAGNOSIS — H35033 Hypertensive retinopathy, bilateral: Secondary | ICD-10-CM

## 2022-12-22 DIAGNOSIS — H4313 Vitreous hemorrhage, bilateral: Secondary | ICD-10-CM

## 2022-12-22 DIAGNOSIS — H25812 Combined forms of age-related cataract, left eye: Secondary | ICD-10-CM

## 2022-12-22 MED ORDER — AFLIBERCEPT 8 MG/0.07ML IZ SOLN
8.0000 mg | INTRAVITREAL | Status: AC | PRN
Start: 2022-12-22 — End: 2022-12-22
  Administered 2022-12-22: 8 mg via INTRAVITREAL

## 2022-12-27 DIAGNOSIS — M797 Fibromyalgia: Secondary | ICD-10-CM | POA: Diagnosis not present

## 2022-12-27 DIAGNOSIS — R21 Rash and other nonspecific skin eruption: Secondary | ICD-10-CM | POA: Diagnosis not present

## 2022-12-27 DIAGNOSIS — R700 Elevated erythrocyte sedimentation rate: Secondary | ICD-10-CM | POA: Diagnosis not present

## 2022-12-27 DIAGNOSIS — M353 Polymyalgia rheumatica: Secondary | ICD-10-CM | POA: Diagnosis not present

## 2023-01-18 NOTE — Progress Notes (Addendum)
[facility] - Clinic Note  01/19/2023     CHIEF COMPLAINT Patient presents for Retina Follow Up  HISTORY OF PRESENT ILLNESS: Gloria Gomez is a 52 y.o. female who presents to the clinic today for:  HPI     Retina Follow Up   Patient presents with  Diabetic Retinopathy.  In both eyes.  This started 4 weeks ago.  Duration of 4 weeks.  Since onset it is stable.  I, the attending physician,  performed the HPI with the patient and updated documentation appropriately.        Comments   4 week retina follow up PDR OU IVE HD OD and IVE OS pt is reporting no vision changes she denies any flashes or floaters       Last edited by Rennis Chris, MD on 01/19/2023  5:09 PM.    Patient states vision is "okay", she doesn't think her new glasses are the right Rx  Referring physician:   HISTORICAL INFORMATION:   Selected notes from the MEDICAL RECORD NUMBER Referred for DM exam   CURRENT MEDICATIONS: No current outpatient medications on file. (Ophthalmic Drugs)   No current facility-administered medications for this visit. (Ophthalmic Drugs)   Current Outpatient Medications (Other)  Medication Sig   amLODipine (NORVASC) 5 MG tablet Take 1 tablet (5 mg total) by mouth daily.   Continuous Blood Gluc Sensor (FREESTYLE LIBRE 3 SENSOR) MISC by Does not apply route every 14 (fourteen) days.   FARXIGA 10 MG TABS tablet Take 10 mg by mouth daily.   furosemide (LASIX) 20 MG tablet TAKE 2 TABLETS BY MOUTH IN THE MORNING AND 1 IN THE EVENING   gabapentin (NEURONTIN) 300 MG capsule Take 1 capsule (300 mg total) by mouth 3 (three) times daily.   HUMALOG KWIKPEN 100 UNIT/ML KwikPen Inject into the skin.   HYDROcodone-acetaminophen (NORCO/VICODIN) 5-325 MG tablet Take 1-2 tablets by mouth every 6 (six) hours as needed for moderate pain.   hydroxychloroquine (PLAQUENIL) 200 MG tablet    insulin aspart (NOVOLOG) 100 UNIT/ML injection Inject 10 Units into the skin 2 (two) times  daily with a meal. Per endocrinologist   Insulin Glargine (BASAGLAR KWIKPEN) 100 UNIT/ML 10 units every morning and 24 units nightly   losartan (COZAAR) 100 MG tablet Take 1 tablet (100 mg total) by mouth daily.   meloxicam (MOBIC) 15 MG tablet Take 1 tablet daily as needed.   metFORMIN (GLUCOPHAGE) 1000 MG tablet TAKE 1 TABLET BY MOUTH TWICE DAILY WITH MEALS   metoCLOPramide (REGLAN) 5 MG tablet TAKE 1 TABLET BY MOUTH 4 TIMES DAILY BEFORE MEAL(S) AND AT BEDTIME   metoprolol succinate (TOPROL-XL) 100 MG 24 hr tablet Take 2 tablets by mouth once daily   Multiple Vitamin (MULTIVITAMIN WITH MINERALS) TABS tablet Take 1 tablet by mouth daily.   NOVOLOG FLEXPEN 100 UNIT/ML FlexPen    pantoprazole (PROTONIX) 40 MG tablet TAKE 1 TABLET BY MOUTH ONCE DAILY .   promethazine (PHENERGAN) 12.5 MG tablet TAKE 1 TO 2 TABLETS BY MOUTH EVERY 6 HOURS AS NEEDED FOR NAUSEA   rosuvastatin (CRESTOR) 10 MG tablet Take 1 tablet (10 mg total) by mouth daily.   TRULICITY 1.5 MG/0.5ML SOPN Inject into the skin.   verapamil (CALAN-SR) 240 MG CR tablet Take by mouth.   verapamil (CALAN-SR) 240 MG CR tablet TAKE 1 TABLET BY MOUTH ONCE DAILY AT BEDTIME   VICTOZA 18 MG/3ML SOPN INJECT 1.2MG  INTO THE SKIN DAILY (Patient not taking: Reported on 08/18/2022)  No current facility-administered medications for this visit. (Other)   REVIEW OF SYSTEMS: ROS   Positive for: Endocrine, Eyes Negative for: Constitutional, Gastrointestinal, Neurological, Skin, Genitourinary, Musculoskeletal, HENT, Cardiovascular, Respiratory, Psychiatric, Allergic/Imm, Heme/Lymph Last edited by Etheleen Mayhew, COT on 01/19/2023  1:53 PM.     ALLERGIES Allergies  Allergen Reactions   Gluten Meal Swelling   Lisinopril Cough   Pioglitazone Other (See Comments)    ELEVATED glucoses + worse chronic nausea   PAST MEDICAL HISTORY Past Medical History:  Diagnosis Date   Abdominal bloating    likely from diab gastroparesis.  Dr. Adela Lank  started trial of reglan 03/2019.   Benign brain tumor (HCC)    Cystic lesion in cerebral aqueduct region with mild hydrocephalus-- stable MRI 02/2016.  Surveillance MRI 05/2017 --dilated cerebral aqueduct related to aqueductal stenosis and subsequent mild hydrocephalus (due to the 11 mm stable cystic lesion in cerebral aqueduct---?congenitial?.   Cataract    OU   Dysmenorrhea    vicodin occ during first 2 days of cycle.   Fibromyalgia    Gluten intolerance    pt reports she underwent full GI w/u to r/o celiac dz   Hepatic steatosis    ultrasound 08/2017. Hx of very mild elevation of ALT.  Stable on u/s 06/2020   History of adenomatous polyp of colon 04/08/2019   recall Feb 2024   Hyperlipidemia, mixed    Hypertension    +white coat component   Hypertensive retinopathy of both eyes    Insomnia    Iron deficiency 01/2019   Hb 11.3. Hemoccults neg x 3 03/19/19. EGD and colonoscopy 04/08/19 showed NO cause for IDA.  Pt does have menorrhagia, though, so she'll see her GYN.  started FeSO4 325 qd approx 04/14/19.   Menorrhagia    resulting in IDA 2021   PMR (polymyalgia rheumatica) (HCC) 2022   question of; hx of elevated ESR-->better with prednisone but prednisone was contraindicated d/t eye issues/hyperglycemia-->rheum started following her 02/2021->plaquenil 04/2021   Proliferative diabetic retinopathy of both eyes (HCC)    steroid injections 10/2017--improved   Sensorineural hearing loss of left ear    Sudden left hearing loss summer 2016--no improvement with steroids 01/2015 so brain MRI done by Dr. Jenne Pane and it showed brain tumor that was determined to be benign.  Pt's hearing not bad enough for hearing aid as of 06/2016.   Type 2 diabetes with complication (HCC)    +microalbuminuria, diab retpthy, diabetic gastroparesis (gastric emptying study mildly abnl 03/2017).  Recommended lantus 08/2018 but pt declined. Mild microalbuminuria.   Uterine fibroid 2022   per pt report, pelvic u/s in GYN office    Past Surgical History:  Procedure Laterality Date   ANOSCOPY  05/12/2019   Procedure: normal exam, minimal hemorrhoid disease. Hyertrophied anal papila, benign appearing, posterior midline. Surgeon: Romie Levee MD   CHOLECYSTECTOMY  2000   COLONOSCOPY  04/08/2019   5 adenomas, recall 3 yrs; no cause for IDA found.  Hypertrophied anal papillae->bx showed low grade dysplasia; GI referred her to colorectal surgeon.   ESOPHAGOGASTRODUODENOSCOPY  04/08/2019   mild chronic reactive gastritis. H pylori NEG.  No cause for IDA found.   GAS INSERTION Right 10/31/2018   Procedure: Insertion Of C3F8 Gas;  Surgeon: Rennis Chris, MD;  Location: [facility] OR;  Service: Ophthalmology;  Laterality: Right;   GASTRIC EMPTYING SCAN  04/20/2017   Mildly abnormal, particularly the 1st hour of emptying.   MEMBRANE PEEL Right 10/31/2018   Procedure: MEMBRANE PEEL;  Surgeon: Rennis Chris, MD;  Location: [facility] OR;  Service: Ophthalmology;  Laterality: Right;   PARS PLANA VITRECTOMY Right 04/12/2018   Procedure: Right PARS PLANA VITRECTOMY WITH 25 GAUGE with intravitreal antibiotics;  Surgeon: Rennis Chris, MD;  Location: [facility] OR;  Service: Ophthalmology;  Laterality: Right;   PARS PLANA VITRECTOMY Right 10/31/2018   Procedure: PARS PLANA VITRECTOMY WITH 25 GAUGE;  Surgeon: Rennis Chris, MD;  Location: [facility] OR;  Service: Ophthalmology;  Laterality: Right;   PHOTOCOAGULATION WITH LASER Right 10/31/2018   Procedure: Photocoagulation With Laser;  Surgeon: Rennis Chris, MD;  Location: [facility] OR;  Service: Ophthalmology;  Laterality: Right;   TRANSTHORACIC ECHOCARDIOGRAM  08/23/2020   Grd I DD, o/w normal.   FAMILY HISTORY Family History  Problem Relation Age of Onset   Brain cancer Mother    Diabetes Father    Diabetes Maternal Grandmother    Cataracts Maternal Grandmother    Cervical cancer Paternal Grandmother    Colon cancer Maternal Grandfather 32   Amblyopia Neg Hx    Blindness Neg Hx    Glaucoma Neg Hx     Macular degeneration Neg Hx    Retinal detachment Neg Hx    Strabismus Neg Hx    Retinitis pigmentosa Neg Hx    Esophageal cancer Neg Hx    Stomach cancer Neg Hx    Rectal cancer Neg Hx    SOCIAL HISTORY Social History   Tobacco Use   Smoking status: Never   Smokeless tobacco: Never  Vaping Use   Vaping status: Never Used  Substance Use Topics   Alcohol use: No   Drug use: No       OPHTHALMIC EXAM:  Base Eye Exam     Visual Acuity (Snellen - Linear)       Right Left   Dist cc 20/25 -2 20/25 -2   Dist ph cc NI NI    Correction: Glasses         Tonometry (Tonopen, 2:00 PM)       Right Left   Pressure 15 14         Pupils       Pupils Dark Light Shape React APD   Right PERRL 3 2 Round Brisk None   Left PERRL 3 2 Round Brisk None         Visual Fields       Left Right    Full Full         Extraocular Movement       Right Left    Full, Ortho Full, Ortho         Neuro/Psych     Oriented x3: Yes   Mood/Affect: Normal         Dilation     Both eyes: 2.5% Phenylephrine @ 2:00 PM           Slit Lamp and Fundus Exam     Slit Lamp Exam       Right Left   Lids/Lashes Dermatochalasis - upper lid, mild Meibomian gland dysfunction, Telangiectasia Dermatochalasis - upper lid, Meibomian gland dysfunction, Telangiectasia   Conjunctiva/Sclera White and quiet White and quiet   Cornea Clear, well healed temporal cataract wounds Trace Punctate epithelial erosions, trace EBMD   Anterior Chamber Deep and quiet Deep and quiet   Iris Round and dilated, No NVI Round and dilated, No NVI   Lens PC IOL in good position, 1-2+ Posterior capsular opacification, PC folds 2-3+ Nuclear sclerosis with mild brunescence, 2-3+ Cortical  cataract, 1+ Posterior subcapsular cataract   Anterior Vitreous post vitrectomy, trace pigment Vitreous syneresis, vitreous condensations, no frank red heme, old white VH clearing and settled inferiorly         Fundus Exam        Right Left   Disc mild Pallor, Sharp rim, temporal PPA Pink and sharp, Compact, PPA   C/D Ratio 0.2 0.0   Macula Flat, good foveal reflex, mild cystic changes temporal macula -- slightly improved, focal MA and exudates ST mac, +focal laser changes Flat, good foveal reflex, trace cystic changes -- stably improved, scattered MA; trace ERM, light focal laser changes   Vessels attenuated, Tortuous attenuated, Tortuous   Periphery Attached, rare MA, scattered DBH greatest posteriorly, 360 PRP, good laser fill in 360 attached, scattered IRH; 360 PRP scars -- with good fill in changes           IMAGING AND PROCEDURES  Imaging and Procedures for   OCT, Retina - OU - Both Eyes       Right Eye Quality was good. Central Foveal Thickness: 315. Progression has improved. Findings include no SRF, abnormal foveal contour, intraretinal hyper-reflective material, epiretinal membrane, intraretinal fluid (Persistent IRF/cystic changes temporal macula and fovea -- slightly improved).   Left Eye Quality was good. Central Foveal Thickness: 274. Progression has been stable. Findings include normal foveal contour, no IRF, no SRF, intraretinal hyper-reflective material (stable improvement in IRF/cystic changes temporal fovea, +vitreous opacities--stably improved).   Notes *Images captured and stored on drive  Diagnosis / Impression:  +DME OU OD: Persistent IRF/cystic changes temporal macula and fovea -- slightly improved OS: stable improvement in IRF/cystic changes temporal fovea, +vitreous opacities--stably improved  Clinical management:  See below  Abbreviations: NFP - Normal foveal profile. CME - cystoid macular edema. PED - pigment epithelial detachment. IRF - intraretinal fluid. SRF - subretinal fluid. EZ - ellipsoid zone. ERM - epiretinal membrane. ORA - outer retinal atrophy. ORT - outer retinal tubulation. SRHM - subretinal hyper-reflective material       Intravitreal Injection,  Pharmacologic Agent - OD - Right Eye       Time Out 01/19/2023. 3:08 PM. Confirmed correct patient, procedure, site, and patient consented.   Anesthesia Topical anesthesia was used. Anesthetic medications included Lidocaine 2%, Proparacaine 0.5%.   Procedure Preparation included 5% betadine to ocular surface, eyelid speculum. A (32g) needle was used.   Injection: 8 mg aflibercept 8 MG/0.07ML   Route: Intravitreal, Site: Right Eye   NDC: [id number], Lot: [id number], Expiration date: 02/27/2024, Waste: 0 mL   Post-op Post injection exam found visual acuity of at least counting fingers. The patient tolerated the procedure well. There were no complications. The patient received written and verbal post procedure care education. Post injection medications were not given.            ASSESSMENT/PLAN:   ICD-10-CM   1. Proliferative diabetic retinopathy of both eyes with macular edema associated with type 2 diabetes mellitus (HCC)  E11.3513 OCT, Retina - OU - Both Eyes    Intravitreal Injection, Pharmacologic Agent - OD - Right Eye    aflibercept (EYLEA HD) ophthalmic injection 8 mg    2. Current use of insulin (HCC)  Z79.4     3. Long term (current) use of oral hypoglycemic drugs  Z79.84     4. Long-term (current) use of injectable non-insulin antidiabetic drugs  Z79.85     5. Vitreous hemorrhage of left eye (HCC)  H43.12     6.  Vitreous hemorrhage, right eye (HCC)  H43.11     7. Hypertensive retinopathy of both eyes  H35.033     8. Essential hypertension  I10     9. Right endophthalmia  H44.001     10. Combined forms of age-related cataract of left eye  H25.812     11. Pseudophakia of right eye  Z96.1      1-4.  Proliferative diabetic retinopathy w/ DME, OU - HbA1c 7.9 (10.08.24),8.6 (06.11.24), 7.1 (3.23.23), 8.6 (9.14.22), 7.8 (06.13.22) - s/p IVA OD #1 9.20.19, #2 (10.25.19), #3 (11.15.19), #4 (12.17.19), #5 (01.14.20), #6 (2.11.20), #7 (05.29.20), #8 (08.19.20), #9  (10.30.20), #10 (12.09.20), #11 (01.11.21), #12 (02.15.21), #13 (03.23.21), #14 (04.20.21), #15 (06.08.21), #16 (07.06.21) -- IVA resistance - s/p IVA OS #1 9.27.19, #2 (10.25.19), #3 (11.15.19), #4 (12.17.19), #5 (01.14.20), #6 (2.11.20), #7 (04.26.20), #8 (05.29.20), #9 (06.26.20), #10 (08.05.20), #11 (11.11.20), #12 (12.09.20), #13 (01.11.21), #14 (02.15.21), #15 (03.23.21), #16 (04.20.21), #17 (06.08.21) -- IVA resistance, #18 (09.27.21) ============================================================ - s/p IVE OD #1 (08.03.21) -- sample, #2 (09.10.21), #3 (10.08.21), #4 (11.23.21), #5 (12.21.21), #6 (01.19.22) sample, #7 (2.16.22), #8 (3.22.22), #9 (04.19.22), #10 (05.27.22), #11 (06.29.22), #12 (08.03.22), #13 (09.07.22), #14 (10.10.22), #15 (11.14.22), #16 (12.12.22), #17 (01.10.23) - sample, #18 (02.07.23), #19 (03.21.23), #20 (04.18.23), #21 (05.16.23) -- IVE resistance ============================================================= - s/p IVE OS #1 (10.25.21), #2 (11.23.21), #3 (12.21.21), #4 (3.22.22), #5 (06.29.22), #6 (08.03.22), #7 (09.07.22), #8 (10.10.22), #9 (11.14.22), #10 (02.07.23), #11 (05.16.23), #12 (08.15.23), #13 (11.14.23), #14 (02.06.24), #15 (05.06.24), #16 SAMPLE (07.01.24), #17 (09.26.24) ============================================================ - IVV OD #1 (06.16.23 -- sample), #2 (07.17.23), #3 (08.15.23), #4 (09.12.23), #5 (10.10.23), #6 (11.14.23), #7 (12.12.23), #8 (01.09.23), #9 (02.06.24), #10 (03.11.24), #11 (04.08.24), #12 (05.06.24), #13 (06.03.24), #14 (07.01.24), #15 (07.29.24), #16 (08.26.24)- IVV resistance ============================================================== - s/p IVE HD OD #1 (sample 09.26.24), #2 (10.25.24)  - S/P PRP OS (09.20.19), (5.19.20), (08.19.20), (04.28.21), (02.02.22)  - S/P PRP OD (9.27.19 and 11.21.19), fill-in (04.14.20) (09.03.20, surgery)  - S/P focal laser OS (07.06.21), OD (09.20.22)  - FA (9.20.19) shows +NVE OU and leaking MA and  capillary nonperfusion  - repeat FA 11.15.19 shows NV regressing OU - pre-op: OD w/ VA down at 20/25, but there is some preretinal fibrosis / tractional membranes just superior to disc and mild central DME  - s/p 25g PPV+MP+10% C3F8 gas OD (09.03.20) -- ERM/PRF removal OD  - BCVA: stable at 20/25 OU             - fibrosis/ERM stably improved; retina attached - OCT shows OD: Persistent IRF/cystic changes temporal macula and fovea -- slightly improved at 4 wks; OS: stable improvement in IRF/cystic changes temporal fovea, +vitreous opacities--stably improved at 8 weeks since last injxn - recommend IVE HD OD #3 today 11.21.24 with follow up in 4 weeks - will hold tx in OS today - pt in agreement **OS on ~q52m maintenance treatment schedule -- due in Dec**  - RBA of procedure discussed, questions answered - see procedure note  - Eylea informed consent form re-signed and scanned on 05.16.2023  - Vabysmo informed consent form signed on 06.16.23  - Eylea HD informed consent obtained, resigned, and scanned on 10.25.24  - Eylea HD approved for 2024  - f/u 4 weeks, DFE, OCT, possible injection(s)  5. Vitreous hemorrhage OS -- stably improved  - recurrent VH, onset 09.22.21  - etiology: secondary to PDR as described above (no RT/RD on exam)  - s/p PRP OS (9.20.19), (05.19.20), (08.19.20), (04.28.21), (02.02.22) -  s/p IVA OS on 4.26.20, 5.29.20, 6.26.20, 8.5.20,11.11.20, 12.06.20 and so on as above   - Vit condensations stably improved  - BCVA stable at 20/25  - IVEs as above  - f/u 4 weeks DFE, OCT  6. History of Endophthalmitis OD  - s/p IVA OU 04/09/2018  - s/p 25g PPV w/ intravitreal vanc, ceftaz and cefepime OD, 2.14.2020  - s/p intravitreal tap / vanc and ceflaz injections (02.16.20)             - gram stain (2.14.20) shows G+ cocci, WBCs mostly PMNs;   - repeat gram stain from t/i (2.16.20) -- no organisms, just WBCs             - cultures from vitreous grew rare Staph warneri; cultures  from t/i -- no growth             - doing well, BCVA 20/25             - inflammation/posterior debris resolved  - monitor  7. History of Vitreous Hemorrhage OD -- cleared from PPV x2 for endophthalmitis and ERM/preretinal fibrosis   - secondary to PDR  8,9. Hypertensive retinopathy OU  - discussed importance of tight BP control.  - monitor  10. Combined form age related cataract OS - The symptoms of cataract, surgical options, and treatments and risks were discussed with patient.   - discussed diagnosis and progression  11. Pseudophakia OD  - s/p CE/IOL OD (Dr. Laruth Bouchard, 12.11.20)  - beautiful surgery, doing well  Ophthalmic Meds Ordered this visit:  Meds ordered this encounter  Medications   aflibercept (EYLEA HD) ophthalmic injection 8 mg     Return in about 4 weeks (around 02/16/2023) for f/u PDR OU, DFE, OCT, Possible Injxn.  There are no Patient Instructions on file for this visit.  This document serves as a record of services personally performed by Karie Chimera, MD, PhD. It was created on their behalf by Glee Arvin. Manson Passey, OA an ophthalmic technician. The creation of this record is the provider's dictation and/or activities during the visit.    Electronically signed by: Glee Arvin. Manson Passey, OA 01/19/23 5:14 PM  Karie Chimera, M.D., Ph.D. Diseases & Surgery of the Retina and Vitreous [facility]  I have reviewed the above documentation for accuracy and completeness, and I agree with the above. Karie Chimera, M.D., Ph.D. 01/19/23 5:14 PM   Abbreviations: M myopia (nearsighted); A astigmatism; H hyperopia (farsighted); P presbyopia; Mrx spectacle prescription;  CTL contact lenses; OD right eye; OS left eye; OU both eyes  XT exotropia; ET esotropia; PEK punctate epithelial keratitis; PEE punctate epithelial erosions; DES dry eye syndrome; MGD meibomian gland dysfunction; ATs artificial tears; PFAT's preservative free artificial tears; NSC nuclear  sclerotic cataract; PSC posterior subcapsular cataract; ERM epi-retinal membrane; PVD posterior vitreous detachment; RD retinal detachment; DM diabetes mellitus; DR diabetic retinopathy; NPDR non-proliferative diabetic retinopathy; PDR proliferative diabetic retinopathy; CSME clinically significant macular edema; DME diabetic macular edema; dbh dot blot hemorrhages; CWS cotton wool spot; POAG primary open angle glaucoma; C/D cup-to-disc ratio; HVF humphrey visual field; GVF goldmann visual field; OCT optical coherence tomography; IOP intraocular pressure; BRVO Branch retinal vein occlusion; CRVO central retinal vein occlusion; CRAO central retinal artery occlusion; BRAO branch retinal artery occlusion; RT retinal tear; SB scleral buckle; PPV pars plana vitrectomy; VH Vitreous hemorrhage; PRP panretinal laser photocoagulation; IVK intravitreal kenalog; VMT vitreomacular traction; MH Macular hole;  NVD neovascularization of the disc;  NVE neovascularization elsewhere; AREDS age related eye disease study; ARMD age related macular degeneration; POAG primary open angle glaucoma; EBMD epithelial/anterior basement membrane dystrophy; ACIOL anterior chamber intraocular lens; IOL intraocular lens; PCIOL posterior chamber intraocular lens; Phaco/IOL phacoemulsification with intraocular lens placement; PRK photorefractive keratectomy; LASIK laser assisted in situ keratomileusis; HTN hypertension; DM diabetes mellitus; COPD chronic obstructive pulmonary disease

## 2023-01-19 ENCOUNTER — Ambulatory Visit (INDEPENDENT_AMBULATORY_CARE_PROVIDER_SITE_OTHER): Payer: Medicare Other | Admitting: Ophthalmology

## 2023-01-19 ENCOUNTER — Encounter (INDEPENDENT_AMBULATORY_CARE_PROVIDER_SITE_OTHER): Payer: Self-pay | Admitting: Ophthalmology

## 2023-01-19 DIAGNOSIS — Z7985 Long-term (current) use of injectable non-insulin antidiabetic drugs: Secondary | ICD-10-CM | POA: Diagnosis not present

## 2023-01-19 DIAGNOSIS — H25812 Combined forms of age-related cataract, left eye: Secondary | ICD-10-CM

## 2023-01-19 DIAGNOSIS — Z961 Presence of intraocular lens: Secondary | ICD-10-CM

## 2023-01-19 DIAGNOSIS — H4312 Vitreous hemorrhage, left eye: Secondary | ICD-10-CM

## 2023-01-19 DIAGNOSIS — Z794 Long term (current) use of insulin: Secondary | ICD-10-CM

## 2023-01-19 DIAGNOSIS — H35033 Hypertensive retinopathy, bilateral: Secondary | ICD-10-CM

## 2023-01-19 DIAGNOSIS — I10 Essential (primary) hypertension: Secondary | ICD-10-CM

## 2023-01-19 DIAGNOSIS — H44001 Unspecified purulent endophthalmitis, right eye: Secondary | ICD-10-CM

## 2023-01-19 DIAGNOSIS — Z7984 Long term (current) use of oral hypoglycemic drugs: Secondary | ICD-10-CM

## 2023-01-19 DIAGNOSIS — E113513 Type 2 diabetes mellitus with proliferative diabetic retinopathy with macular edema, bilateral: Secondary | ICD-10-CM | POA: Diagnosis not present

## 2023-01-19 DIAGNOSIS — H4313 Vitreous hemorrhage, bilateral: Secondary | ICD-10-CM

## 2023-01-19 DIAGNOSIS — H4311 Vitreous hemorrhage, right eye: Secondary | ICD-10-CM

## 2023-01-19 MED ORDER — AFLIBERCEPT 8 MG/0.07ML IZ SOLN
8.0000 mg | INTRAVITREAL | Status: AC | PRN
Start: 2023-01-19 — End: 2023-01-19
  Administered 2023-01-19: 8 mg via INTRAVITREAL

## 2023-01-25 ENCOUNTER — Other Ambulatory Visit: Payer: Self-pay | Admitting: Family Medicine

## 2023-02-09 ENCOUNTER — Encounter: Payer: Self-pay | Admitting: Pharmacist

## 2023-02-09 ENCOUNTER — Other Ambulatory Visit: Payer: Self-pay | Admitting: Pharmacist

## 2023-02-09 MED ORDER — LOSARTAN POTASSIUM 100 MG PO TABS: 100.0000 mg | ORAL_TABLET | Freq: Every day | ORAL | 0 refills | Status: DC

## 2023-02-09 MED ORDER — ROSUVASTATIN CALCIUM 10 MG PO TABS: 10.0000 mg | ORAL_TABLET | Freq: Every day | ORAL | 0 refills | Status: DC

## 2023-02-09 NOTE — Telephone Encounter (Signed)
Patient's current losartan and rosuvastatin do not have refills. Will run out before next PCP appointment 02/19/2023.  Requesting refills from PCP.

## 2023-02-09 NOTE — Progress Notes (Signed)
Pharmacy Quality Measure Review  This patient is appearing on a report for being at risk of failing the adherence measure for cholesterol (statin) and hypertension (ACEi/ARB) medications this calendar year.   Medication: rosuvastatin  Last fill date: 11/14/2022 for 90 day supply  Medication: losartan  Last fill date: 11/14/2022 for 90 day supply  Mrs. Curbow has as upcoming appointment with PCP on 02/19/2023 however she will be due to refill both losartan and rosuvastatin before appointment. She does not currently have refills remain on these prescriptions.    Will collaborate with provider to facilitate refill needs.  Henrene Pastor, PharmD Clinical Pharmacist [facility]

## 2023-02-10 ENCOUNTER — Other Ambulatory Visit: Payer: Self-pay | Admitting: Family Medicine

## 2023-02-14 NOTE — Progress Notes (Signed)
[facility] - Clinic Note  02/16/2023     CHIEF COMPLAINT Patient presents for Retina Follow Up  HISTORY OF PRESENT ILLNESS: Gloria Gomez is a 52 y.o. female who presents to the clinic today for:  HPI     Retina Follow Up   Patient presents with  Diabetic Retinopathy.  In both eyes.  This started 4 weeks ago.  Duration of 4 weeks.  Since onset it is stable.  I, the attending physician,  performed the HPI with the patient and updated documentation appropriately.        Comments   Patient feels the vision is the same. She is not using eye drops. Her blood sugar was 104.      Last edited by Rennis Chris, MD on 02/16/2023  3:26 PM.     Patient states   Referring physician:   HISTORICAL INFORMATION:   Selected notes from the MEDICAL RECORD NUMBER Referred for DM exam   CURRENT MEDICATIONS: No current outpatient medications on file. (Ophthalmic Drugs)   No current facility-administered medications for this visit. (Ophthalmic Drugs)   Current Outpatient Medications (Other)  Medication Sig   amLODipine (NORVASC) 5 MG tablet Take 1 tablet (5 mg total) by mouth daily.   Continuous Blood Gluc Sensor (FREESTYLE LIBRE 3 SENSOR) MISC by Does not apply route every 14 (fourteen) days.   FARXIGA 10 MG TABS tablet Take 10 mg by mouth daily.   furosemide (LASIX) 20 MG tablet TAKE 2 TABLETS BY MOUTH IN THE MORNING AND 1 IN THE EVENING   gabapentin (NEURONTIN) 300 MG capsule Take 1 capsule (300 mg total) by mouth 3 (three) times daily.   HUMALOG KWIKPEN 100 UNIT/ML KwikPen Inject into the skin.   HYDROcodone-acetaminophen (NORCO/VICODIN) 5-325 MG tablet Take 1-2 tablets by mouth every 6 (six) hours as needed for moderate pain.   hydroxychloroquine (PLAQUENIL) 200 MG tablet    insulin aspart (NOVOLOG) 100 UNIT/ML injection Inject 10 Units into the skin 2 (two) times daily with a meal. Per endocrinologist   Insulin Glargine (BASAGLAR KWIKPEN) 100 UNIT/ML 10 units  every morning and 24 units nightly   losartan (COZAAR) 100 MG tablet Take 1 tablet (100 mg total) by mouth daily.   meloxicam (MOBIC) 15 MG tablet Take 1 tablet daily as needed.   metFORMIN (GLUCOPHAGE) 1000 MG tablet TAKE 1 TABLET BY MOUTH TWICE DAILY WITH MEALS   metoCLOPramide (REGLAN) 5 MG tablet TAKE 1 TABLET BY MOUTH 4 TIMES DAILY BEFORE MEAL(S) AND AT BEDTIME   metoprolol succinate (TOPROL-XL) 100 MG 24 hr tablet Take 2 tablets by mouth once daily   Multiple Vitamin (MULTIVITAMIN WITH MINERALS) TABS tablet Take 1 tablet by mouth daily.   NOVOLOG FLEXPEN 100 UNIT/ML FlexPen    pantoprazole (PROTONIX) 40 MG tablet TAKE 1 TABLET BY MOUTH ONCE DAILY .   promethazine (PHENERGAN) 12.5 MG tablet TAKE 1 TO 2 TABLETS BY MOUTH EVERY 6 HOURS AS NEEDED FOR NAUSEA   rosuvastatin (CRESTOR) 10 MG tablet Take 1 tablet (10 mg total) by mouth daily.   TRULICITY 1.5 MG/0.5ML SOPN Inject into the skin.   verapamil (CALAN-SR) 240 MG CR tablet Take by mouth.   verapamil (CALAN-SR) 240 MG CR tablet TAKE 1 TABLET BY MOUTH ONCE DAILY AT BEDTIME   VICTOZA 18 MG/3ML SOPN INJECT 1.2MG  INTO THE SKIN DAILY   No current facility-administered medications for this visit. (Other)   REVIEW OF SYSTEMS: ROS   Positive for: Endocrine, Eyes Negative for: Constitutional, Gastrointestinal,  Neurological, Skin, Genitourinary, Musculoskeletal, HENT, Cardiovascular, Respiratory, Psychiatric, Allergic/Imm, Heme/Lymph Last edited by Charlette Caffey, COT on 02/16/2023 12:46 PM.     ALLERGIES Allergies  Allergen Reactions   Gluten Meal Swelling   Lisinopril Cough   Pioglitazone Other (See Comments)    ELEVATED glucoses + worse chronic nausea   PAST MEDICAL HISTORY Past Medical History:  Diagnosis Date   Abdominal bloating    likely from diab gastroparesis.  Dr. Adela Lank started trial of reglan 03/2019.   Benign brain tumor (HCC)    Cystic lesion in cerebral aqueduct region with mild hydrocephalus-- stable MRI  02/2016.  Surveillance MRI 05/2017 --dilated cerebral aqueduct related to aqueductal stenosis and subsequent mild hydrocephalus (due to the 11 mm stable cystic lesion in cerebral aqueduct---?congenitial?.   Cataract    OU   Dysmenorrhea    vicodin occ during first 2 days of cycle.   Fibromyalgia    Gluten intolerance    pt reports she underwent full GI w/u to r/o celiac dz   Hepatic steatosis    ultrasound 08/2017. Hx of very mild elevation of ALT.  Stable on u/s 06/2020   History of adenomatous polyp of colon 04/08/2019   recall Feb 2024   Hyperlipidemia, mixed    Hypertension    +white coat component   Hypertensive retinopathy of both eyes    Insomnia    Iron deficiency 01/2019   Hb 11.3. Hemoccults neg x 3 03/19/19. EGD and colonoscopy 04/08/19 showed NO cause for IDA.  Pt does have menorrhagia, though, so she'll see her GYN.  started FeSO4 325 qd approx 04/14/19.   Menorrhagia    resulting in IDA 2021   PMR (polymyalgia rheumatica) (HCC) 2022   question of; hx of elevated ESR-->better with prednisone but prednisone was contraindicated d/t eye issues/hyperglycemia-->rheum started following her 02/2021->plaquenil 04/2021   Proliferative diabetic retinopathy of both eyes (HCC)    steroid injections 10/2017--improved   Sensorineural hearing loss of left ear    Sudden left hearing loss summer 2016--no improvement with steroids 01/2015 so brain MRI done by Dr. Jenne Pane and it showed brain tumor that was determined to be benign.  Pt's hearing not bad enough for hearing aid as of 06/2016.   Type 2 diabetes with complication (HCC)    +microalbuminuria, diab retpthy, diabetic gastroparesis (gastric emptying study mildly abnl 03/2017).  Recommended lantus 08/2018 but pt declined. Mild microalbuminuria.   Uterine fibroid 2022   per pt report, pelvic u/s in GYN office   Past Surgical History:  Procedure Laterality Date   ANOSCOPY  05/12/2019   Procedure: normal exam, minimal hemorrhoid disease. Hyertrophied  anal papila, benign appearing, posterior midline. Surgeon: Romie Levee MD   CHOLECYSTECTOMY  2000   COLONOSCOPY  04/08/2019   5 adenomas, recall 3 yrs; no cause for IDA found.  Hypertrophied anal papillae->bx showed low grade dysplasia; GI referred her to colorectal surgeon.   ESOPHAGOGASTRODUODENOSCOPY  04/08/2019   mild chronic reactive gastritis. H pylori NEG.  No cause for IDA found.   GAS INSERTION Right 10/31/2018   Procedure: Insertion Of C3F8 Gas;  Surgeon: Rennis Chris, MD;  Location: [facility] OR;  Service: Ophthalmology;  Laterality: Right;   GASTRIC EMPTYING SCAN  04/20/2017   Mildly abnormal, particularly the 1st hour of emptying.   MEMBRANE PEEL Right 10/31/2018   Procedure: MEMBRANE PEEL;  Surgeon: Rennis Chris, MD;  Location: [facility] OR;  Service: Ophthalmology;  Laterality: Right;   PARS PLANA VITRECTOMY Right 04/12/2018   Procedure: Right  PARS PLANA VITRECTOMY WITH 25 GAUGE with intravitreal antibiotics;  Surgeon: Rennis Chris, MD;  Location: [facility] OR;  Service: Ophthalmology;  Laterality: Right;   PARS PLANA VITRECTOMY Right 10/31/2018   Procedure: PARS PLANA VITRECTOMY WITH 25 GAUGE;  Surgeon: Rennis Chris, MD;  Location: [facility] OR;  Service: Ophthalmology;  Laterality: Right;   PHOTOCOAGULATION WITH LASER Right 10/31/2018   Procedure: Photocoagulation With Laser;  Surgeon: Rennis Chris, MD;  Location: [facility] OR;  Service: Ophthalmology;  Laterality: Right;   TRANSTHORACIC ECHOCARDIOGRAM  08/23/2020   Grd I DD, o/w normal.   FAMILY HISTORY Family History  Problem Relation Age of Onset   Brain cancer Mother    Diabetes Father    Diabetes Maternal Grandmother    Cataracts Maternal Grandmother    Cervical cancer Paternal Grandmother    Colon cancer Maternal Grandfather 79   Amblyopia Neg Hx    Blindness Neg Hx    Glaucoma Neg Hx    Macular degeneration Neg Hx    Retinal detachment Neg Hx    Strabismus Neg Hx    Retinitis pigmentosa Neg Hx    Esophageal cancer Neg Hx    Stomach  cancer Neg Hx    Rectal cancer Neg Hx    SOCIAL HISTORY Social History   Tobacco Use   Smoking status: Never   Smokeless tobacco: Never  Vaping Use   Vaping status: Never Used  Substance Use Topics   Alcohol use: No   Drug use: No       OPHTHALMIC EXAM:  Base Eye Exam     Visual Acuity (Snellen - Linear)       Right Left   Dist cc 20/25 20/25   Dist ph cc NI NI    Correction: Glasses         Tonometry (Tonopen, 12:48 PM)       Right Left   Pressure 15 14         Pupils       Dark Light Shape React APD   Right 3 2 Round Brisk None   Left 3 2 Round Brisk None         Visual Fields       Left Right    Full Full         Extraocular Movement       Right Left    Full, Ortho Full, Ortho         Neuro/Psych     Oriented x3: Yes   Mood/Affect: Normal         Dilation     Both eyes: 2.5% Phenylephrine @ 12:46 PM           Slit Lamp and Fundus Exam     Slit Lamp Exam       Right Left   Lids/Lashes Dermatochalasis - upper lid, mild Meibomian gland dysfunction, Telangiectasia Dermatochalasis - upper lid, Meibomian gland dysfunction, Telangiectasia   Conjunctiva/Sclera White and quiet White and quiet   Cornea Clear, well healed temporal cataract wounds Trace Punctate epithelial erosions, trace EBMD   Anterior Chamber Deep and quiet Deep and quiet   Iris Round and dilated, No NVI Round and dilated, No NVI   Lens PC IOL in good position, 1-2+ Posterior capsular opacification, PC folds 2-3+ Nuclear sclerosis with mild brunescence, 2-3+ Cortical cataract, 1+ Posterior subcapsular cataract   Anterior Vitreous post vitrectomy, trace pigment Vitreous syneresis, vitreous condensations, no frank red heme, old white VH clearing and settled inferiorly -- improved  Fundus Exam       Right Left   Disc mild Pallor, Sharp rim, temporal PPA Pink and sharp, Compact, PPA   C/D Ratio 0.2 0.0   Macula Flat, good foveal reflex, mild cystic  changes temporal macula -- slightly improved, focal MA and exudates ST mac -- improving, +focal laser changes Flat, good foveal reflex, trace cystic changes -- stably improved, scattered MA; trace ERM, light focal laser changes   Vessels attenuated, Tortuous attenuated, Tortuous   Periphery Attached, rare MA, scattered DBH greatest posteriorly, 360 PRP, good laser fill in 360 attached, scattered IRH; 360 PRP scars -- with good fill in changes           Refraction     Wearing Rx       Sphere Cylinder   Right -0.25 Sphere   Left -4.50 Sphere           IMAGING AND PROCEDURES  Imaging and Procedures for   OCT, Retina - OU - Both Eyes       Right Eye Quality was good. Central Foveal Thickness: 309. Progression has improved. Findings include no SRF, abnormal foveal contour, intraretinal hyper-reflective material, epiretinal membrane, intraretinal fluid (Interval improvement in IRF/cystic changes temporal macula and fovea ).   Left Eye Quality was good. Central Foveal Thickness: 282. Progression has been stable. Findings include normal foveal contour, no IRF, no SRF, intraretinal hyper-reflective material (stable improvement in IRF/cystic changes temporal fovea, +vitreous opacities).   Notes *Images captured and stored on drive  Diagnosis / Impression:  +DME OU OD: Interval improvement in IRF/cystic changes temporal macula and fovea  OS: stable improvement in IRF/cystic changes temporal fovea, +vitreous opacities  Clinical management:  See below  Abbreviations: NFP - Normal foveal profile. CME - cystoid macular edema. PED - pigment epithelial detachment. IRF - intraretinal fluid. SRF - subretinal fluid. EZ - ellipsoid zone. ERM - epiretinal membrane. ORA - outer retinal atrophy. ORT - outer retinal tubulation. SRHM - subretinal hyper-reflective material       Intravitreal Injection, Pharmacologic Agent - OD - Right Eye       Time Out 02/16/2023. 1:26 PM. Confirmed  correct patient, procedure, site, and patient consented.   Anesthesia Topical anesthesia was used. Anesthetic medications included Lidocaine 2%, Proparacaine 0.5%.   Procedure Preparation included 5% betadine to ocular surface, eyelid speculum. A (32g) needle was used.   Injection: 8 mg aflibercept 8 MG/0.07ML   Route: Intravitreal, Site: Right Eye   NDC: M6324049, Lot: [id number], Expiration date: 02/27/2024, Waste: 0 mL   Post-op Post injection exam found visual acuity of at least counting fingers. The patient tolerated the procedure well. There were no complications. The patient received written and verbal post procedure care education. Post injection medications were not given.      Intravitreal Injection, Pharmacologic Agent - OS - Left Eye       Time Out 02/16/2023. 1:26 PM. Confirmed correct patient, procedure, site, and patient consented.   Anesthesia Topical anesthesia was used. Anesthetic medications included Lidocaine 2%, Proparacaine 0.5%.   Procedure Preparation included 5% betadine to ocular surface, eyelid speculum. A (32 g) needle was used.   Injection: 2 mg aflibercept 2 MG/0.05ML   Route: Intravitreal, Site: Left Eye   NDC: L6038910, Lot: [id number], Expiration date: 06/25/2024, Waste: 0 mL   Post-op Post injection exam found visual acuity of at least counting fingers. The patient tolerated the procedure well. There were no complications. The patient received written and verbal post procedure care  education. Post injection medications were not given.            ASSESSMENT/PLAN:   ICD-10-CM   1. Proliferative diabetic retinopathy of both eyes with macular edema associated with type 2 diabetes mellitus (HCC)  E11.3513 OCT, Retina - OU - Both Eyes    Intravitreal Injection, Pharmacologic Agent - OD - Right Eye    Intravitreal Injection, Pharmacologic Agent - OS - Left Eye    aflibercept (EYLEA) SOLN 2 mg    aflibercept (EYLEA HD) ophthalmic  injection 8 mg    2. Current use of insulin (HCC)  Z79.4     3. Long term (current) use of oral hypoglycemic drugs  Z79.84     4. Long-term (current) use of injectable non-insulin antidiabetic drugs  Z79.85     5. Vitreous hemorrhage of left eye (HCC)  H43.12     6. Vitreous hemorrhage, right eye (HCC)  H43.11     7. Hypertensive retinopathy of both eyes  H35.033     8. Essential hypertension  I10     9. Right endophthalmia  H44.001     10. Combined forms of age-related cataract of left eye  H25.812     11. Pseudophakia of right eye  Z96.1      1-4.  Proliferative diabetic retinopathy w/ DME, OU - HbA1c 7.9 (10.08.24),8.6 (06.11.24), 7.1 (3.23.23), 8.6 (9.14.22), 7.8 (06.13.22) - s/p IVA OD #1 9.20.19, #2 (10.25.19), #3 (11.15.19), #4 (12.17.19), #5 (01.14.20), #6 (2.11.20), #7 (05.29.20), #8 (08.19.20), #9 (10.30.20), #10 (12.09.20), #11 (01.11.21), #12 (02.15.21), #13 (03.23.21), #14 (04.20.21), #15 (06.08.21), #16 (07.06.21) -- IVA resistance - s/p IVA OS #1 9.27.19, #2 (10.25.19), #3 (11.15.19), #4 (12.17.19), #5 (01.14.20), #6 (2.11.20), #7 (04.26.20), #8 (05.29.20), #9 (06.26.20), #10 (08.05.20), #11 (11.11.20), #12 (12.09.20), #13 (01.11.21), #14 (02.15.21), #15 (03.23.21), #16 (04.20.21), #17 (06.08.21) -- IVA resistance, #18 (09.27.21) ============================================================ - s/p IVE OD #1 (08.03.21) -- sample, #2 (09.10.21), #3 (10.08.21), #4 (11.23.21), #5 (12.21.21), #6 (01.19.22) sample, #7 (2.16.22), #8 (3.22.22), #9 (04.19.22), #10 (05.27.22), #11 (06.29.22), #12 (08.03.22), #13 (09.07.22), #14 (10.10.22), #15 (11.14.22), #16 (12.12.22), #17 (01.10.23) - sample, #18 (02.07.23), #19 (03.21.23), #20 (04.18.23), #21 (05.16.23) -- IVE resistance ============================================================= - s/p IVE OS #1 (10.25.21), #2 (11.23.21), #3 (12.21.21), #4 (3.22.22), #5 (06.29.22), #6 (08.03.22), #7 (09.07.22), #8 (10.10.22), #9 (11.14.22), #10  (02.07.23), #11 (05.16.23), #12 (08.15.23), #13 (11.14.23), #14 (02.06.24), #15 (05.06.24), #16 SAMPLE (07.01.24), #17 (09.26.24) ============================================================ - IVV OD #1 (06.16.23 -- sample), #2 (07.17.23), #3 (08.15.23), #4 (09.12.23), #5 (10.10.23), #6 (11.14.23), #7 (12.12.23), #8 (01.09.23), #9 (02.06.24), #10 (03.11.24), #11 (04.08.24), #12 (05.06.24), #13 (06.03.24), #14 (07.01.24), #15 (07.29.24), #16 (08.26.24)- IVV resistance ============================================================== - s/p IVE HD OD #1 (sample 09.26.24), #2 (10.25.24) # 3(11.21.24)  - S/P PRP OS (09.20.19), (5.19.20), (08.19.20), (04.28.21), (02.02.22)  - S/P PRP OD (9.27.19 and 11.21.19), fill-in (04.14.20) (09.03.20, surgery)  - S/P focal laser OS (07.06.21), OD (09.20.22)  - FA (9.20.19) shows +NVE OU and leaking MA and capillary nonperfusion  - repeat FA 11.15.19 shows NV regressing OU - pre-op: OD w/ VA down at 20/25, but there is some preretinal fibrosis / tractional membranes just superior to disc and mild central DME  - s/p 25g PPV+MP+10% C3F8 gas OD (09.03.20) -- ERM/PRF removal OD  - BCVA: stable at 20/25 OU             - fibrosis/ERM stably improved; retina attached - OCT shows OD: Interval improvement in IRF/cystic changes temporal macula and fovea, OS:  stable improvement in IRF/cystic changes temporal fovea, +vitreous opacities at 3 months since last injxn - recommend IVE HD OD #4 and IVE OS #18 today, 12.20.24 with follow up in 4 weeks **OS on ~q1m maintenance treatment schedule -- due again ~March**  - RBA of procedure discussed, questions answered - see procedure note  - Eylea informed consent form re-signed and scanned on 05.16.2023  - Vabysmo informed consent form signed on 06.16.23  - Eylea HD informed consent obtained, resigned, and scanned on 10.25.24  - Eylea HD approved for 2024  - f/u 4 weeks, DFE, OCT, possible injection(s)  5. Vitreous hemorrhage OS --  stably improved  - recurrent VH, onset 09.22.21  - etiology: secondary to PDR as described above (no RT/RD on exam)  - s/p PRP OS (9.20.19), (05.19.20), (08.19.20), (04.28.21), (02.02.22) - s/p IVA OS on 4.26.20, 5.29.20, 6.26.20, 8.5.20,11.11.20, 12.06.20 and so on as above   - Vit condensations stably improved  - BCVA stable at 20/25  - IVEs as above  - f/u 4 weeks DFE, OCT  6. History of Endophthalmitis OD  - s/p IVA OU 04/09/2018  - s/p 25g PPV w/ intravitreal vanc, ceftaz and cefepime OD, 2.14.2020  - s/p intravitreal tap / vanc and ceflaz injections (02.16.20)             - gram stain (2.14.20) shows G+ cocci, WBCs mostly PMNs;   - repeat gram stain from t/i (2.16.20) -- no organisms, just WBCs             - cultures from vitreous grew rare Staph warneri; cultures from t/i -- no growth             - doing well, BCVA 20/25             - inflammation/posterior debris resolved  - monitor  7. History of Vitreous Hemorrhage OD -- cleared from PPV x2 for endophthalmitis and ERM/preretinal fibrosis   - secondary to PDR  8,9. Hypertensive retinopathy OU  - discussed importance of tight BP control.  - monitor  10. Combined form age related cataract OS - The symptoms of cataract, surgical options, and treatments and risks were discussed with patient.   - discussed diagnosis and progression  11. Pseudophakia OD  - s/p CE/IOL OD (Dr. Laruth Bouchard, 12.11.20)  - beautiful surgery, doing well  Ophthalmic Meds Ordered this visit:  Meds ordered this encounter  Medications   aflibercept (EYLEA) SOLN 2 mg   aflibercept (EYLEA HD) ophthalmic injection 8 mg     Return in about 4 weeks (around 03/16/2023) for f/u PDR OU, DFE, OCT.  There are no Patient Instructions on file for this visit.  This document serves as a record of services personally performed by Karie Chimera, MD, PhD. It was created on their behalf by Berlin Hun COT, an ophthalmic technician. The creation of this  record is the provider's dictation and/or activities during the visit.    Electronically signed by: Berlin Hun COT 12.18.24 2:23 AM  This document serves as a record of services personally performed by Karie Chimera, MD, PhD. It was created on their behalf by Glee Arvin. Manson Passey, OA an ophthalmic technician. The creation of this record is the provider's dictation and/or activities during the visit.    Electronically signed by: Glee Arvin. Manson Passey, OA 02/17/23 2:23 AM  Karie Chimera, M.D., Ph.D. Diseases & Surgery of the Retina and Vitreous [facility] 02/16/2023   I have  reviewed the above documentation for accuracy and completeness, and I agree with the above. Karie Chimera, M.D., Ph.D. 02/17/23 2:25 AM   Abbreviations: M myopia (nearsighted); A astigmatism; H hyperopia (farsighted); P presbyopia; Mrx spectacle prescription;  CTL contact lenses; OD right eye; OS left eye; OU both eyes  XT exotropia; ET esotropia; PEK punctate epithelial keratitis; PEE punctate epithelial erosions; DES dry eye syndrome; MGD meibomian gland dysfunction; ATs artificial tears; PFAT's preservative free artificial tears; NSC nuclear sclerotic cataract; PSC posterior subcapsular cataract; ERM epi-retinal membrane; PVD posterior vitreous detachment; RD retinal detachment; DM diabetes mellitus; DR diabetic retinopathy; NPDR non-proliferative diabetic retinopathy; PDR proliferative diabetic retinopathy; CSME clinically significant macular edema; DME diabetic macular edema; dbh dot blot hemorrhages; CWS cotton wool spot; POAG primary open angle glaucoma; C/D cup-to-disc ratio; HVF humphrey visual field; GVF goldmann visual field; OCT optical coherence tomography; IOP intraocular pressure; BRVO Branch retinal vein occlusion; CRVO central retinal vein occlusion; CRAO central retinal artery occlusion; BRAO branch retinal artery occlusion; RT retinal tear; SB scleral buckle; PPV pars plana vitrectomy;  VH Vitreous hemorrhage; PRP panretinal laser photocoagulation; IVK intravitreal kenalog; VMT vitreomacular traction; MH Macular hole;  NVD neovascularization of the disc; NVE neovascularization elsewhere; AREDS age related eye disease study; ARMD age related macular degeneration; POAG primary open angle glaucoma; EBMD epithelial/anterior basement membrane dystrophy; ACIOL anterior chamber intraocular lens; IOL intraocular lens; PCIOL posterior chamber intraocular lens; Phaco/IOL phacoemulsification with intraocular lens placement; PRK photorefractive keratectomy; LASIK laser assisted in situ keratomileusis; HTN hypertension; DM diabetes mellitus; COPD chronic obstructive pulmonary disease

## 2023-02-16 ENCOUNTER — Encounter (INDEPENDENT_AMBULATORY_CARE_PROVIDER_SITE_OTHER): Payer: Self-pay | Admitting: Ophthalmology

## 2023-02-16 ENCOUNTER — Ambulatory Visit (INDEPENDENT_AMBULATORY_CARE_PROVIDER_SITE_OTHER): Payer: Medicare Other | Admitting: Ophthalmology

## 2023-02-16 DIAGNOSIS — E113513 Type 2 diabetes mellitus with proliferative diabetic retinopathy with macular edema, bilateral: Secondary | ICD-10-CM | POA: Diagnosis not present

## 2023-02-16 DIAGNOSIS — I10 Essential (primary) hypertension: Secondary | ICD-10-CM

## 2023-02-16 DIAGNOSIS — Z794 Long term (current) use of insulin: Secondary | ICD-10-CM

## 2023-02-16 DIAGNOSIS — H25812 Combined forms of age-related cataract, left eye: Secondary | ICD-10-CM

## 2023-02-16 DIAGNOSIS — H4312 Vitreous hemorrhage, left eye: Secondary | ICD-10-CM

## 2023-02-16 DIAGNOSIS — H35033 Hypertensive retinopathy, bilateral: Secondary | ICD-10-CM

## 2023-02-16 DIAGNOSIS — Z7985 Long-term (current) use of injectable non-insulin antidiabetic drugs: Secondary | ICD-10-CM | POA: Diagnosis not present

## 2023-02-16 DIAGNOSIS — Z7984 Long term (current) use of oral hypoglycemic drugs: Secondary | ICD-10-CM | POA: Diagnosis not present

## 2023-02-16 DIAGNOSIS — H4311 Vitreous hemorrhage, right eye: Secondary | ICD-10-CM

## 2023-02-16 DIAGNOSIS — Z961 Presence of intraocular lens: Secondary | ICD-10-CM

## 2023-02-16 DIAGNOSIS — H44001 Unspecified purulent endophthalmitis, right eye: Secondary | ICD-10-CM

## 2023-02-16 DIAGNOSIS — H4313 Vitreous hemorrhage, bilateral: Secondary | ICD-10-CM

## 2023-02-16 MED ORDER — AFLIBERCEPT 8 MG/0.07ML IZ SOLN
8.0000 mg | INTRAVITREAL | Status: AC | PRN
  Administered 2023-02-16: 8 mg via INTRAVITREAL

## 2023-02-16 MED ORDER — AFLIBERCEPT 2MG/0.05ML IZ SOLN FOR KALEIDOSCOPE
2.0000 mg | INTRAVITREAL | Status: AC | PRN
  Administered 2023-02-16: 2 mg via INTRAVITREAL

## 2023-02-19 ENCOUNTER — Ambulatory Visit (INDEPENDENT_AMBULATORY_CARE_PROVIDER_SITE_OTHER): Payer: Medicare Other | Admitting: Family Medicine

## 2023-02-19 ENCOUNTER — Encounter: Payer: Self-pay | Admitting: Family Medicine

## 2023-02-19 ENCOUNTER — Other Ambulatory Visit: Payer: Self-pay | Admitting: Family Medicine

## 2023-02-19 VITALS — BP 146/84 | HR 85 | Ht 64.0 in | Wt 176.6 lb

## 2023-02-19 DIAGNOSIS — E7800 Pure hypercholesterolemia, unspecified: Secondary | ICD-10-CM | POA: Diagnosis not present

## 2023-02-19 DIAGNOSIS — G894 Chronic pain syndrome: Secondary | ICD-10-CM | POA: Diagnosis not present

## 2023-02-19 DIAGNOSIS — M797 Fibromyalgia: Secondary | ICD-10-CM

## 2023-02-19 DIAGNOSIS — Z794 Long term (current) use of insulin: Secondary | ICD-10-CM

## 2023-02-19 DIAGNOSIS — I10 Essential (primary) hypertension: Secondary | ICD-10-CM | POA: Diagnosis not present

## 2023-02-19 DIAGNOSIS — E083513 Diabetes mellitus due to underlying condition with proliferative diabetic retinopathy with macular edema, bilateral: Secondary | ICD-10-CM | POA: Diagnosis not present

## 2023-02-19 DIAGNOSIS — M353 Polymyalgia rheumatica: Secondary | ICD-10-CM

## 2023-02-19 DIAGNOSIS — Z0000 Encounter for general adult medical examination without abnormal findings: Secondary | ICD-10-CM | POA: Diagnosis not present

## 2023-02-19 LAB — COMPREHENSIVE METABOLIC PANEL
ALT: 31 U/L (ref 0–35)
AST: 19 U/L (ref 0–37)
Albumin: 4.4 g/dL (ref 3.5–5.2)
Alkaline Phosphatase: 94 U/L (ref 39–117)
BUN: 19 mg/dL (ref 6–23)
CO2: 30 meq/L (ref 19–32)
Calcium: 10.1 mg/dL (ref 8.4–10.5)
Chloride: 104 meq/L (ref 96–112)
Creatinine, Ser: 0.81 mg/dL (ref 0.40–1.20)
GFR: 82.4 mL/min (ref >60.00)
Glucose, Bld: 158 mg/dL — ABNORMAL HIGH (ref 70–99)
Potassium: 4.4 meq/L (ref 3.5–5.1)
Sodium: 144 meq/L (ref 135–145)
Total Bilirubin: 0.3 mg/dL (ref 0.2–1.2)
Total Protein: 6.8 g/dL (ref 6.0–8.3)

## 2023-02-19 LAB — LIPID PANEL
Cholesterol: 230 mg/dL — ABNORMAL HIGH (ref 0–200)
HDL: 40.9 mg/dL (ref >39.00)
LDL Cholesterol: 143 mg/dL — ABNORMAL HIGH (ref 0–99)
NonHDL: 188.97
Total CHOL/HDL Ratio: 6
Triglycerides: 230 mg/dL — ABNORMAL HIGH (ref 0.0–149.0)
VLDL: 46 mg/dL — ABNORMAL HIGH (ref 0.0–40.0)

## 2023-02-19 LAB — TSH: TSH: 1.15 u[IU]/mL (ref 0.35–5.50)

## 2023-02-19 MED ORDER — GABAPENTIN 300 MG PO CAPS: 300.0000 mg | ORAL_CAPSULE | Freq: Three times a day (TID) | ORAL | 1 refills | Status: DC

## 2023-02-19 MED ORDER — METOPROLOL SUCCINATE ER 100 MG PO TB24: ORAL_TABLET | ORAL | 1 refills | Status: DC

## 2023-02-19 MED ORDER — ROSUVASTATIN CALCIUM 10 MG PO TABS: 10.0000 mg | ORAL_TABLET | Freq: Every day | ORAL | 1 refills | Status: DC

## 2023-02-19 MED ORDER — AMLODIPINE BESYLATE 5 MG PO TABS: 5.0000 mg | ORAL_TABLET | Freq: Every day | ORAL | 1 refills | Status: DC

## 2023-02-19 MED ORDER — FUROSEMIDE 20 MG PO TABS: ORAL_TABLET | ORAL | 1 refills | Status: DC

## 2023-02-19 NOTE — Patient Instructions (Signed)

## 2023-02-19 NOTE — Progress Notes (Signed)
Office Note 02/19/2023  CC:  Chief Complaint  Patient presents with   Annual Exam    Pt is not fasting.    Patient is a 52 y.o. female who is here for annual health maintenance exam and 76-month follow-up hyperlipidemia, hypertension, and chronic pain. A/P as of last visit: " mixed hyperlipidemia, LDL 98 and trigs 276 about 6 mo ago. We increased her rosuvastatin to 10 mg every day at that time. She is not fasting today so we will recheck lipids in 6 months.   #2 hypertension, well-controlled on amlodipine 5 mg a day, losartan 100 mg a day, and Toprol-XL 200 mg a day. Check electrolytes and creatinine today.   #3  chronic pain: Fibromyalgia plus polymyalgia rheumatica. Stable.  Continue as needed meloxicam and as needed Vicodin. Continue gabapentin 300 mg 3 times daily. She is followed by rheumatology.   #5 DM managed by endo (Dr. Ronnette Hila)."  INTERIM HX: Feeling well. Says DM well controlled.  Feels like swelling is well controlled with lasix40 qAM and 20 qPM.  Pain pretty well controlled with daily meloxicam. Adds vicodin to augment this a couple of times per week.  Home bp's 120s/80   PMP AWARE reviewed today: most recent rx for gabapentin 300 mg was filled 11/22/2022, # 270, rx by me. Most recent Vicodin 5/325 prescription filled to 224, #40, prescription by me. No red flags.  Past Medical History:  Diagnosis Date   Abdominal bloating    likely from diab gastroparesis.  Dr. Adela Lank started trial of reglan 03/2019.   Benign brain tumor (HCC)    Cystic lesion in cerebral aqueduct region with mild hydrocephalus-- stable MRI 02/2016.  Surveillance MRI 05/2017 --dilated cerebral aqueduct related to aqueductal stenosis and subsequent mild hydrocephalus (due to the 11 mm stable cystic lesion in cerebral aqueduct---?congenitial?.   Cataract    OU   Dysmenorrhea    vicodin occ during first 2 days of cycle.   Fibromyalgia    Gluten intolerance    pt reports she  underwent full GI w/u to r/o celiac dz   Hepatic steatosis    ultrasound 08/2017. Hx of very mild elevation of ALT.  Stable on u/s 06/2020   History of adenomatous polyp of colon 04/08/2019   recall Feb 2024   Hyperlipidemia, mixed    Hypertension    +white coat component   Hypertensive retinopathy of both eyes    Insomnia    Iron deficiency 01/2019   Hb 11.3. Hemoccults neg x 3 03/19/19. EGD and colonoscopy 04/08/19 showed NO cause for IDA.  Pt does have menorrhagia, though, so she'll see her GYN.  started FeSO4 325 qd approx 04/14/19.   Menorrhagia    resulting in IDA 2021   PMR (polymyalgia rheumatica) (HCC) 2022   question of; hx of elevated ESR-->better with prednisone but prednisone was contraindicated d/t eye issues/hyperglycemia-->rheum started following her 02/2021->plaquenil 04/2021   Proliferative diabetic retinopathy of both eyes (HCC)    steroid injections 10/2017--improved   Sensorineural hearing loss of left ear    Sudden left hearing loss summer 2016--no improvement with steroids 01/2015 so brain MRI done by Dr. Jenne Pane and it showed brain tumor that was determined to be benign.  Pt's hearing not bad enough for hearing aid as of 06/2016.   Type 2 diabetes with complication (HCC)    +microalbuminuria, diab retpthy, diabetic gastroparesis (gastric emptying study mildly abnl 03/2017).  Recommended lantus 08/2018 but pt declined. Mild microalbuminuria.   Uterine fibroid 2022  per pt report, pelvic u/s in GYN office    Past Surgical History:  Procedure Laterality Date   ANOSCOPY  05/12/2019   Procedure: normal exam, minimal hemorrhoid disease. Hyertrophied anal papila, benign appearing, posterior midline. Surgeon: Romie Levee MD   CHOLECYSTECTOMY  2000   COLONOSCOPY  04/08/2019   5 adenomas, recall 3 yrs; no cause for IDA found.  Hypertrophied anal papillae->bx showed low grade dysplasia; GI referred her to colorectal surgeon.   ESOPHAGOGASTRODUODENOSCOPY  04/08/2019   mild chronic  reactive gastritis. H pylori NEG.  No cause for IDA found.   GAS INSERTION Right 10/31/2018   Procedure: Insertion Of C3F8 Gas;  Surgeon: Rennis Chris, MD;  Location: [facility] OR;  Service: Ophthalmology;  Laterality: Right;   GASTRIC EMPTYING SCAN  04/20/2017   Mildly abnormal, particularly the 1st hour of emptying.   MEMBRANE PEEL Right 10/31/2018   Procedure: MEMBRANE PEEL;  Surgeon: Rennis Chris, MD;  Location: [facility] OR;  Service: Ophthalmology;  Laterality: Right;   PARS PLANA VITRECTOMY Right 04/12/2018   Procedure: Right PARS PLANA VITRECTOMY WITH 25 GAUGE with intravitreal antibiotics;  Surgeon: Rennis Chris, MD;  Location: [facility] OR;  Service: Ophthalmology;  Laterality: Right;   PARS PLANA VITRECTOMY Right 10/31/2018   Procedure: PARS PLANA VITRECTOMY WITH 25 GAUGE;  Surgeon: Rennis Chris, MD;  Location: [facility] OR;  Service: Ophthalmology;  Laterality: Right;   PHOTOCOAGULATION WITH LASER Right 10/31/2018   Procedure: Photocoagulation With Laser;  Surgeon: Rennis Chris, MD;  Location: [facility] OR;  Service: Ophthalmology;  Laterality: Right;   TRANSTHORACIC ECHOCARDIOGRAM  08/23/2020   Grd I DD, o/w normal.    Family History  Problem Relation Age of Onset   Brain cancer Mother    Diabetes Father    Diabetes Maternal Grandmother    Cataracts Maternal Grandmother    Cervical cancer Paternal Grandmother    Colon cancer Maternal Grandfather 70   Amblyopia Neg Hx    Blindness Neg Hx    Glaucoma Neg Hx    Macular degeneration Neg Hx    Retinal detachment Neg Hx    Strabismus Neg Hx    Retinitis pigmentosa Neg Hx    Esophageal cancer Neg Hx    Stomach cancer Neg Hx    Rectal cancer Neg Hx     Social History   Socioeconomic History   Marital status: Married    Spouse name: Not on file   Number of children: 0   Years of education: Not on file   Highest education level: Not on file  Occupational History   Occupation: unemployed  Tobacco Use   Smoking status: Never   Smokeless tobacco:  Never  Vaping Use   Vaping status: Never Used  Substance and Sexual Activity   Alcohol use: No   Drug use: No   Sexual activity: Yes  Other Topics Concern   Not on file  Social History Narrative   Married, no children..   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       Occup: Town Production designer, theatre/television/film for [employer].   No T/A/Ds.   Social Drivers of Corporate investment banker Strain: Not on file  Food Insecurity: Not on file  Transportation Needs: Not on file  Physical Activity: Not on file  Stress: Not on file  Social Connections: Not on file  Intimate Partner Violence: Not on file    Outpatient Medications Prior to Visit  Medication Sig Dispense Refill   Continuous Blood Gluc Sensor (FREESTYLE [place]  3 SENSOR) MISC by Does not apply route every 14 (fourteen) days.     FARXIGA 10 MG TABS tablet Take 10 mg by mouth daily.     HUMALOG KWIKPEN 100 UNIT/ML KwikPen Inject into the skin.     HYDROcodone-acetaminophen (NORCO/VICODIN) 5-325 MG tablet Take 1-2 tablets by mouth every 6 (six) hours as needed for moderate pain. 40 tablet 0   insulin aspart (NOVOLOG) 100 UNIT/ML injection Inject 10 Units into the skin 2 (two) times daily with a meal. Per endocrinologist     Insulin Glargine (BASAGLAR KWIKPEN) 100 UNIT/ML 10 units every morning and 24 units nightly     losartan (COZAAR) 100 MG tablet Take 1 tablet (100 mg total) by mouth daily. 30 tablet 0   meloxicam (MOBIC) 15 MG tablet Take 1 tablet daily as needed. 30 tablet 0   metFORMIN (GLUCOPHAGE) 1000 MG tablet TAKE 1 TABLET BY MOUTH TWICE DAILY WITH MEALS 180 tablet 0   metoCLOPramide (REGLAN) 5 MG tablet TAKE 1 TABLET BY MOUTH 4 TIMES DAILY BEFORE MEAL(S) AND AT BEDTIME 360 tablet 1   Multiple Vitamin (MULTIVITAMIN WITH MINERALS) TABS tablet Take 1 tablet by mouth daily.     NOVOLOG FLEXPEN 100 UNIT/ML FlexPen      pantoprazole (PROTONIX) 40 MG tablet TAKE 1 TABLET BY MOUTH ONCE DAILY . 90 tablet 1   promethazine  (PHENERGAN) 12.5 MG tablet TAKE 1 TO 2 TABLETS BY MOUTH EVERY 6 HOURS AS NEEDED FOR NAUSEA 30 tablet 0   verapamil (CALAN-SR) 240 MG CR tablet Take by mouth.     verapamil (CALAN-SR) 240 MG CR tablet TAKE 1 TABLET BY MOUTH ONCE DAILY AT BEDTIME 90 tablet 0   TRULICITY 1.5 MG/0.5ML SOPN Inject into the skin. (Patient not taking: Reported on 02/19/2023)     VICTOZA 18 MG/3ML SOPN INJECT 1.2MG  INTO THE SKIN DAILY (Patient not taking: Reported on 02/19/2023) 18 mL 0   amLODipine (NORVASC) 5 MG tablet Take 1 tablet (5 mg total) by mouth daily. 90 tablet 1   furosemide (LASIX) 20 MG tablet TAKE 2 TABLETS BY MOUTH IN THE MORNING AND 1 IN THE EVENING 270 tablet 1   gabapentin (NEURONTIN) 300 MG capsule Take 1 capsule (300 mg total) by mouth 3 (three) times daily. 270 capsule 1   hydroxychloroquine (PLAQUENIL) 200 MG tablet  (Patient not taking: Reported on 02/19/2023)     metoprolol succinate (TOPROL-XL) 100 MG 24 hr tablet Take 2 tablets by mouth once daily 180 tablet 1   rosuvastatin (CRESTOR) 10 MG tablet Take 1 tablet (10 mg total) by mouth daily. 30 tablet 0   No facility-administered medications prior to visit.    Allergies  Allergen Reactions   Gluten Meal Swelling   Lisinopril Cough   Pioglitazone Other (See Comments)    ELEVATED glucoses + worse chronic nausea    Review of Systems  Constitutional:  Negative for appetite change, chills, fatigue and fever.  HENT:  Negative for congestion, dental problem, ear pain and sore throat.   Eyes:  Negative for discharge, redness and visual disturbance.  Respiratory:  Negative for cough, chest tightness, shortness of breath and wheezing.   Cardiovascular:  Negative for chest pain, palpitations and leg swelling.  Gastrointestinal:  Negative for abdominal pain, blood in stool, diarrhea, nausea and vomiting.  Genitourinary:  Negative for difficulty urinating, dysuria, flank pain, frequency, hematuria and urgency.  Musculoskeletal:  Negative for  arthralgias, back pain, joint swelling, myalgias and neck stiffness.  Skin:  Negative for  pallor and rash.  Neurological:  Negative for dizziness, speech difficulty, weakness and headaches.  Hematological:  Negative for adenopathy. Does not bruise/bleed easily.  Psychiatric/Behavioral:  Negative for confusion and sleep disturbance. The patient is not nervous/anxious.     PE;    02/19/2023   10:47 AM 08/18/2022   10:23 AM 08/18/2022   10:13 AM  Vitals with BMI  Height 5\' 4"     Weight 176 lbs 10 oz  186 lbs 6 oz  BMI 30.3    Systolic 160 140 045  Diastolic 92 80 85  Pulse 85  77    Exam chaperoned by Cloe Motsinger, CMA  Gen: Alert, well appearing.  Patient is oriented to person, place, time, and situation. AFFECT: pleasant, lucid thought and speech. ENT: Ears: EACs clear, normal epithelium.  TMs with good light reflex and landmarks bilaterally.  Eyes: no injection, icteris, swelling, or exudate.  EOMI, PERRLA. Nose: no drainage or turbinate edema/swelling.  No injection or focal lesion.  Mouth: lips without lesion/swelling.  Oral mucosa pink and moist.  Dentition intact and without obvious caries or gingival swelling.  Oropharynx without erythema, exudate, or swelling.  Neck: supple/nontender.  No LAD, mass, or TM.  Carotid pulses 2+ bilaterally, without bruits. CV: RRR, no m/r/g.   LUNGS: CTA bilat, nonlabored resps, good aeration in all lung fields. ABD: soft, NT, ND, BS normal.  No hepatospenomegaly or mass.  No bruits. EXT: no clubbing, cyanosis, or edema.  Musculoskeletal: no joint swelling, erythema, warmth, or tenderness.  ROM of all joints intact. Skin - no sores or suspicious lesions or rashes or color changes Foot exam - no swelling, tenderness or skin or vascular lesions. Color and temperature is normal. Sensation is intact. Peripheral pulses are palpable. Toenails are normal.  Pertinent labs:  Lab Results  Component Value Date   TSH 1.68 02/08/2022   Lab Results   Component Value Date   WBC 10.3 02/08/2022   HGB 12.0 02/08/2022   HCT 35.9 02/08/2022   MCV 82.5 02/08/2022   PLT 410 (H) 02/08/2022   Lab Results  Component Value Date   CREATININE 0.68 08/18/2022   BUN 14 08/18/2022   NA 141 08/18/2022   K 3.8 08/18/2022   CL 108 08/18/2022   CO2 20 08/18/2022   Lab Results  Component Value Date   ALT 61 (H) 08/18/2022   AST 35 08/18/2022   ALKPHOS 98 01/31/2022   BILITOT 0.2 08/18/2022   Lab Results  Component Value Date   CHOL 184 02/08/2022   Lab Results  Component Value Date   HDL 47 (L) 02/08/2022   Lab Results  Component Value Date   LDLCALC 98 02/08/2022   Lab Results  Component Value Date   TRIG 276 (H) 02/08/2022   Lab Results  Component Value Date   CHOLHDL 3.9 02/08/2022   Lab Results  Component Value Date   HGBA1C 8.6 08/08/2022   ASSESSMENT AND PLAN:   No problem-specific Assessment & Plan notes found for this encounter.  #1 Health maintenance exam: Reviewed age and gender appropriate health maintenance issues (prudent diet, regular exercise, health risks of tobacco and excessive alcohol, use of seatbelts, fire alarms in home, use of sunscreen).  Also reviewed age and gender appropriate health screening as well as vaccine recommendations. Vaccines: flu-->UTD sept.  All up-to-date Labs: c-Met, TSH, lipid panel. Cervical ca screening: per GYN MD Breast ca screening: Up-to-date June 2023--->plans on getting this 04/2023--> [facility]. Colon ca screening: hx  multiple polyps, recall as of 03/2022.  She'll call GI and schedule.  2. mixed hyperlipidemia, LDL 98 and trigs 276 December 2023. We increased her rosuvastatin to 10 mg every day at that time. She is not fasting today so we will recheck lipids in 6 months.   #3 hypertension, well-controlled on amlodipine 5 mg a day, losartan 100 mg a day, and Toprol-XL 200 mg a day. Check electrolytes and creatinine today.   #4  chronic pain: Fibromyalgia  plus polymyalgia rheumatica. Stable.  Continue meloxicam 15 every day and as needed Vicodin (new rx not needed today). Continue gabapentin 300 mg 3 times daily. She is followed by rheumatology.   #5 DM managed by endo (Dr. Ronnette Hila).  An After Visit Summary was printed and given to the patient.  FOLLOW UP:  Return in about 6 months (around 08/20/2023) for routine chronic illness f/u.  Signed:  Santiago Bumpers, MD           02/19/2023

## 2023-02-20 ENCOUNTER — Telehealth: Payer: Self-pay | Admitting: Family Medicine

## 2023-02-20 DIAGNOSIS — E782 Mixed hyperlipidemia: Secondary | ICD-10-CM

## 2023-02-20 MED ORDER — ROSUVASTATIN CALCIUM 20 MG PO TABS: 20.0000 mg | ORAL_TABLET | Freq: Every day | ORAL | 2 refills | Status: DC

## 2023-02-20 NOTE — Telephone Encounter (Signed)
This was an orders only encounter.

## 2023-02-27 ENCOUNTER — Other Ambulatory Visit: Payer: Self-pay | Admitting: Family Medicine

## 2023-03-01 NOTE — Telephone Encounter (Signed)
 PHARMACY  IS CALLING IN TO SEE IF PATIENT  NEED'S TO  CONTINUE AMLODIPINE PHARMACY          PHARMACY   WILL LIKE A CALL BACK

## 2023-03-06 ENCOUNTER — Other Ambulatory Visit: Payer: Self-pay | Admitting: Family Medicine

## 2023-03-09 ENCOUNTER — Other Ambulatory Visit: Payer: Self-pay | Admitting: Family Medicine

## 2023-03-15 NOTE — Progress Notes (Signed)
[facility] - Clinic Note  03/16/2023     CHIEF COMPLAINT Patient presents for Retina Follow Up  HISTORY OF PRESENT ILLNESS: Gloria Gomez is a 53 y.o. female who presents to the clinic today for:  HPI     Retina Follow Up   Patient presents with  Diabetic Retinopathy.  In both eyes.  This started 4 weeks ago.  Duration of 4 weeks.  Since onset it is stable.  I, the attending physician,  performed the HPI with the patient and updated documentation appropriately.        Comments   Patient feels the vision in the right eye that is as strong as it was. The vision is not as clear and sharp. She is not using eye drops. Her blood sugar was 98.      Last edited by Rennis Chris, MD on 03/16/2023  5:10 PM.    Patient states she has had some blood pressure issues, it has been in the high 160's, she states she has been out of medication for about 3 weeks bc she couldn't get it refilled  Referring physician:   HISTORICAL INFORMATION:   Selected notes from the MEDICAL RECORD NUMBER Referred for DM exam   CURRENT MEDICATIONS: No current outpatient medications on file. (Ophthalmic Drugs)   No current facility-administered medications for this visit. (Ophthalmic Drugs)   Current Outpatient Medications (Other)  Medication Sig   amLODipine (NORVASC) 5 MG tablet Take 1 tablet (5 mg total) by mouth daily.   Continuous Blood Gluc Sensor (FREESTYLE LIBRE 3 SENSOR) MISC by Does not apply route every 14 (fourteen) days.   FARXIGA 10 MG TABS tablet Take 10 mg by mouth daily.   furosemide (LASIX) 20 MG tablet TAKE 2 TABLETS BY MOUTH IN THE MORNING AND 1 IN THE EVENING   gabapentin (NEURONTIN) 300 MG capsule Take 1 capsule (300 mg total) by mouth 3 (three) times daily.   HUMALOG KWIKPEN 100 UNIT/ML KwikPen Inject into the skin.   HYDROcodone-acetaminophen (NORCO/VICODIN) 5-325 MG tablet Take 1-2 tablets by mouth every 6 (six) hours as needed for moderate pain.   insulin  aspart (NOVOLOG) 100 UNIT/ML injection Inject 10 Units into the skin 2 (two) times daily with a meal. Per endocrinologist   Insulin Glargine (BASAGLAR KWIKPEN) 100 UNIT/ML 10 units every morning and 24 units nightly   losartan (COZAAR) 100 MG tablet Take 1 tablet by mouth once daily   meloxicam (MOBIC) 15 MG tablet Take 1 tablet daily as needed.   metFORMIN (GLUCOPHAGE) 1000 MG tablet TAKE 1 TABLET BY MOUTH TWICE DAILY WITH MEALS   metoCLOPramide (REGLAN) 5 MG tablet TAKE 1 TABLET BY MOUTH 4 TIMES DAILY BEFORE MEAL(S) AND AT BEDTIME   metoprolol succinate (TOPROL-XL) 100 MG 24 hr tablet Take 2 tablets by mouth once daily   Multiple Vitamin (MULTIVITAMIN WITH MINERALS) TABS tablet Take 1 tablet by mouth daily.   NOVOLOG FLEXPEN 100 UNIT/ML FlexPen    pantoprazole (PROTONIX) 40 MG tablet Take 1 tablet by mouth once daily   promethazine (PHENERGAN) 12.5 MG tablet TAKE 1 TO 2 TABLETS BY MOUTH EVERY 6 HOURS AS NEEDED FOR NAUSEA   rosuvastatin (CRESTOR) 20 MG tablet Take 1 tablet (20 mg total) by mouth daily.   TRULICITY 1.5 MG/0.5ML SOPN Inject into the skin.   verapamil (CALAN-SR) 240 MG CR tablet Take by mouth.   verapamil (CALAN-SR) 240 MG CR tablet TAKE 1 TABLET BY MOUTH ONCE DAILY AT BEDTIME   VICTOZA  18 MG/3ML SOPN INJECT 1.2MG  INTO THE SKIN DAILY   No current facility-administered medications for this visit. (Other)   REVIEW OF SYSTEMS: ROS   Positive for: Endocrine, Eyes Negative for: Constitutional, Gastrointestinal, Neurological, Skin, Genitourinary, Musculoskeletal, HENT, Cardiovascular, Respiratory, Psychiatric, Allergic/Imm, Heme/Lymph Last edited by Charlette Caffey, COT on 03/16/2023  1:08 PM.      ALLERGIES Allergies  Allergen Reactions   Gluten Meal Swelling   Lisinopril Cough   Pioglitazone Other (See Comments)    ELEVATED glucoses + worse chronic nausea   PAST MEDICAL HISTORY Past Medical History:  Diagnosis Date   Abdominal bloating    likely from diab  gastroparesis.  Dr. Adela Lank started trial of reglan 03/2019.   Benign brain tumor (HCC)    Cystic lesion in cerebral aqueduct region with mild hydrocephalus-- stable MRI 02/2016.  Surveillance MRI 05/2017 --dilated cerebral aqueduct related to aqueductal stenosis and subsequent mild hydrocephalus (due to the 11 mm stable cystic lesion in cerebral aqueduct---?congenitial?.   Cataract    OU   Dysmenorrhea    vicodin occ during first 2 days of cycle.   Fibromyalgia    Gluten intolerance    pt reports she underwent full GI w/u to r/o celiac dz   Hepatic steatosis    ultrasound 08/2017. Hx of very mild elevation of ALT.  Stable on u/s 06/2020   History of adenomatous polyp of colon 04/08/2019   recall Feb 2024   Hyperlipidemia, mixed    Hypertension    +white coat component   Hypertensive retinopathy of both eyes    Insomnia    Iron deficiency 01/2019   Hb 11.3. Hemoccults neg x 3 03/19/19. EGD and colonoscopy 04/08/19 showed NO cause for IDA.  Pt does have menorrhagia, though, so she'll see her GYN.  started FeSO4 325 qd approx 04/14/19.   Menorrhagia    resulting in IDA 2021   PMR (polymyalgia rheumatica) (HCC) 2022   question of; hx of elevated ESR-->better with prednisone but prednisone was contraindicated d/t eye issues/hyperglycemia-->rheum started following her 02/2021->plaquenil 04/2021   Proliferative diabetic retinopathy of both eyes (HCC)    steroid injections 10/2017--improved   Sensorineural hearing loss of left ear    Sudden left hearing loss summer 2016--no improvement with steroids 01/2015 so brain MRI done by Dr. Jenne Pane and it showed brain tumor that was determined to be benign.  Pt's hearing not bad enough for hearing aid as of 06/2016.   Type 2 diabetes with complication (HCC)    +microalbuminuria, diab retpthy, diabetic gastroparesis (gastric emptying study mildly abnl 03/2017).  Recommended lantus 08/2018 but pt declined. Mild microalbuminuria.   Uterine fibroid 2022   per pt  report, pelvic u/s in GYN office   Past Surgical History:  Procedure Laterality Date   ANOSCOPY  05/12/2019   Procedure: normal exam, minimal hemorrhoid disease. Hyertrophied anal papila, benign appearing, posterior midline. Surgeon: Romie Levee MD   CHOLECYSTECTOMY  2000   COLONOSCOPY  04/08/2019   5 adenomas, recall 3 yrs; no cause for IDA found.  Hypertrophied anal papillae->bx showed low grade dysplasia; GI referred her to colorectal surgeon.   ESOPHAGOGASTRODUODENOSCOPY  04/08/2019   mild chronic reactive gastritis. H pylori NEG.  No cause for IDA found.   GAS INSERTION Right 10/31/2018   Procedure: Insertion Of C3F8 Gas;  Surgeon: Rennis Chris, MD;  Location[facility] OR;  Service: Ophthalmology;  Laterality: Right;   GASTRIC EMPTYING SCAN  04/20/2017   Mildly abnormal, particularly the 1st hour of emptying.  MEMBRANE PEEL Right 10/31/2018   Procedure: MEMBRANE PEEL;  Surgeon: Rennis Chris, MD;  Location: [facility] OR;  Service: Ophthalmology;  Laterality: Right;   PARS PLANA VITRECTOMY Right 04/12/2018   Procedure: Right PARS PLANA VITRECTOMY WITH 25 GAUGE with intravitreal antibiotics;  Surgeon: Rennis Chris, MD;  Location: [facility] OR;  Service: Ophthalmology;  Laterality: Right;   PARS PLANA VITRECTOMY Right 10/31/2018   Procedure: PARS PLANA VITRECTOMY WITH 25 GAUGE;  Surgeon: Rennis Chris, MD;  Location: [facility] OR;  Service: Ophthalmology;  Laterality: Right;   PHOTOCOAGULATION WITH LASER Right 10/31/2018   Procedure: Photocoagulation With Laser;  Surgeon: Rennis Chris, MD;  Location: [facility] OR;  Service: Ophthalmology;  Laterality: Right;   TRANSTHORACIC ECHOCARDIOGRAM  08/23/2020   Grd I DD, o/w normal.   FAMILY HISTORY Family History  Problem Relation Age of Onset   Brain cancer Mother    Diabetes Father    Diabetes Maternal Grandmother    Cataracts Maternal Grandmother    Cervical cancer Paternal Grandmother    Colon cancer Maternal Grandfather 36   Amblyopia Neg Hx    Blindness Neg  Hx    Glaucoma Neg Hx    Macular degeneration Neg Hx    Retinal detachment Neg Hx    Strabismus Neg Hx    Retinitis pigmentosa Neg Hx    Esophageal cancer Neg Hx    Stomach cancer Neg Hx    Rectal cancer Neg Hx    SOCIAL HISTORY Social History   Tobacco Use   Smoking status: Never   Smokeless tobacco: Never  Vaping Use   Vaping status: Never Used  Substance Use Topics   Alcohol use: No   Drug use: No       OPHTHALMIC EXAM:  Base Eye Exam     Visual Acuity (Snellen - Linear)       Right Left   Dist cc 20/25 +2 20/25 -2   Dist ph cc NI     Correction: Glasses         Tonometry (Tonopen, 1:18 PM)       Right Left   Pressure 14 14         Pupils       Dark Light Shape React APD   Right 3 2 Round Brisk None   Left 3 2 Round Brisk None         Visual Fields       Left Right    Full Full         Extraocular Movement       Right Left    Full, Ortho Full, Ortho         Neuro/Psych     Oriented x3: Yes   Mood/Affect: Normal         Dilation     Both eyes: 1.0% Mydriacyl, 2.5% Phenylephrine @ 1:08 PM           Slit Lamp and Fundus Exam     Slit Lamp Exam       Right Left   Lids/Lashes Dermatochalasis - upper lid, mild Meibomian gland dysfunction, Telangiectasia Dermatochalasis - upper lid, Meibomian gland dysfunction, Telangiectasia   Conjunctiva/Sclera White and quiet White and quiet   Cornea Clear, well healed temporal cataract wounds Trace Punctate epithelial erosions, trace EBMD   Anterior Chamber Deep and quiet Deep and quiet   Iris Round and dilated, No NVI Round and dilated, No NVI   Lens PC IOL in good position, 1-2+ Posterior capsular opacification, PC  folds 2-3+ Nuclear sclerosis with mild brunescence, 2-3+ Cortical cataract, 1+ Posterior subcapsular cataract   Anterior Vitreous post vitrectomy, trace pigment Vitreous syneresis, vitreous condensations, no frank red heme, old white VH clearing and settled inferiorly --  improved         Fundus Exam       Right Left   Disc mild Pallor, Sharp rim, temporal PPA Pink and sharp, Compact, PPA   C/D Ratio 0.2 0.1   Macula Flat, good foveal reflex, mild cystic changes temporal macula -- persistent, focal MA and exudates ST mac -- improving, +focal laser changes Flat, good foveal reflex, trace cystic changes -- stably improved, scattered MA; trace ERM, light focal laser changes   Vessels attenuated, Tortuous attenuated, Tortuous   Periphery Attached, rare MA, scattered DBH greatest posteriorly, 360 PRP, good laser fill in 360 attached, scattered IRH; 360 PRP scars -- with good fill in changes           Refraction     Wearing Rx       Sphere Cylinder   Right -0.25 Sphere   Left -4.50 Sphere           IMAGING AND PROCEDURES  Imaging and Procedures for   OCT, Retina - OU - Both Eyes       Right Eye Quality was good. Central Foveal Thickness: 303. Progression has improved. Findings include no SRF, abnormal foveal contour, intraretinal hyper-reflective material, epiretinal membrane, intraretinal fluid (Mild interval improvement in IRF/cystic changes temporal macula and fovea ).   Left Eye Quality was good. Central Foveal Thickness: 271. Progression has improved. Findings include normal foveal contour, no IRF, no SRF, intraretinal hyper-reflective material (stable improvement in IRF/cystic changes temporal fovea, interval improvement in vitreous opacities).   Notes *Images captured and stored on drive  Diagnosis / Impression:  +DME OU OD: mild interval improvement in IRF/cystic changes temporal macula and fovea  OS: stable improvement in IRF/cystic changes temporal fovea, interval improvement in vitreous opacities  Clinical management:  See below  Abbreviations: NFP - Normal foveal profile. CME - cystoid macular edema. PED - pigment epithelial detachment. IRF - intraretinal fluid. SRF - subretinal fluid. EZ - ellipsoid zone. ERM - epiretinal  membrane. ORA - outer retinal atrophy. ORT - outer retinal tubulation. SRHM - subretinal hyper-reflective material       Intravitreal Injection, Pharmacologic Agent - OD - Right Eye       Time Out 03/16/2023. 1:24 PM. Confirmed correct patient, procedure, site, and patient consented.   Anesthesia Topical anesthesia was used. Anesthetic medications included Lidocaine 2%, Proparacaine 0.5%.   Procedure Preparation included 5% betadine to ocular surface, eyelid speculum. A (32g) needle was used.   Injection: 8 mg aflibercept 8 MG/0.07ML   Route: Intravitreal, Site: Right Eye   NDC: [id number], Lot: [id number], Expiration date: 05/28/2023, Waste: 0 mL   Post-op Post injection exam found visual acuity of at least counting fingers. The patient tolerated the procedure well. There were no complications. The patient received written and verbal post procedure care education. Post injection medications were not given.   Notes **SAMPLE MEDICATION ADMINISTERED**            ASSESSMENT/PLAN:   ICD-10-CM   1. Proliferative diabetic retinopathy of both eyes with macular edema associated with type 2 diabetes mellitus (HCC)  E11.3513 OCT, Retina - OU - Both Eyes    Intravitreal Injection, Pharmacologic Agent - OD - Right Eye    aflibercept (EYLEA HD) ophthalmic injection 8 mg  2. Current use of insulin (HCC)  Z79.4     3. Long term (current) use of oral hypoglycemic drugs  Z79.84     4. Long-term (current) use of injectable non-insulin antidiabetic drugs  Z79.85     5. Vitreous hemorrhage of left eye (HCC)  H43.12     6. Vitreous hemorrhage, right eye (HCC)  H43.11     7. Hypertensive retinopathy of both eyes  H35.033     8. Essential hypertension  I10     9. Right endophthalmia  H44.001     10. Combined forms of age-related cataract of left eye  H25.812     11. Pseudophakia of right eye  Z96.1      1-4.  Proliferative diabetic retinopathy w/ DME, OU - HbA1c 7.9  (10.08.24),8.6 (06.11.24), 7.1 (3.23.23), 8.6 (9.14.22), 7.8 (06.13.22) - s/p IVA OD #1 9.20.19, #2 (10.25.19), #3 (11.15.19), #4 (12.17.19), #5 (01.14.20), #6 (2.11.20), #7 (05.29.20), #8 (08.19.20), #9 (10.30.20), #10 (12.09.20), #11 (01.11.21), #12 (02.15.21), #13 (03.23.21), #14 (04.20.21), #15 (06.08.21), #16 (07.06.21) -- IVA resistance - s/p IVA OS #1 9.27.19, #2 (10.25.19), #3 (11.15.19), #4 (12.17.19), #5 (01.14.20), #6 (2.11.20), #7 (04.26.20), #8 (05.29.20), #9 (06.26.20), #10 (08.05.20), #11 (11.11.20), #12 (12.09.20), #13 (01.11.21), #14 (02.15.21), #15 (03.23.21), #16 (04.20.21), #17 (06.08.21) -- IVA resistance, #18 (09.27.21) ============================================================ - s/p IVE OD #1 (08.03.21) -- sample, #2 (09.10.21), #3 (10.08.21), #4 (11.23.21), #5 (12.21.21), #6 (01.19.22) sample, #7 (2.16.22), #8 (3.22.22), #9 (04.19.22), #10 (05.27.22), #11 (06.29.22), #12 (08.03.22), #13 (09.07.22), #14 (10.10.22), #15 (11.14.22), #16 (12.12.22), #17 (01.10.23) - sample, #18 (02.07.23), #19 (03.21.23), #20 (04.18.23), #21 (05.16.23) -- IVE resistance ============================================================= - s/p IVE OS #1 (10.25.21), #2 (11.23.21), #3 (12.21.21), #4 (3.22.22), #5 (06.29.22), #6 (08.03.22), #7 (09.07.22), #8 (10.10.22), #9 (11.14.22), #10 (02.07.23), #11 (05.16.23), #12 (08.15.23), #13 (11.14.23), #14 (02.06.24), #15 (05.06.24), #16 SAMPLE (07.01.24), #17 (09.26.24), #18 (12.20.24) ============================================================ - IVV OD #1 (06.16.23 -- sample), #2 (07.17.23), #3 (08.15.23), #4 (09.12.23), #5 (10.10.23), #6 (11.14.23), #7 (12.12.23), #8 (01.09.23), #9 (02.06.24), #10 (03.11.24), #11 (04.08.24), #12 (05.06.24), #13 (06.03.24), #14 (07.01.24), #15 (07.29.24), #16 (08.26.24)- IVV resistance ============================================================== - s/p IVE HD OD #1 (sample 09.26.24), #2 (10.25.24), #3 (11.21.24), #4  (12.20.24)  - S/P PRP OS (09.20.19), (5.19.20), (08.19.20), (04.28.21), (02.02.22)  - S/P PRP OD (9.27.19 and 11.21.19), fill-in (04.14.20) (09.03.20, surgery)  - S/P focal laser OS (07.06.21), OD (09.20.22)  - FA (9.20.19) shows +NVE OU and leaking MA and capillary nonperfusion  - repeat FA 11.15.19 shows NV regressing OU - pre-op: OD w/ VA down at 20/25, but there is some preretinal fibrosis / tractional membranes just superior to disc and mild central DME  - s/p 25g PPV+MP+10% C3F8 gas OD (09.03.20) -- ERM/PRF removal OD  - BCVA: stable at 20/25 OU             - fibrosis/ERM stably improved; retina attached - OCT shows OD: mild interval improvement in IRF/cystic changes temporal macula and fovea; OS: stable improvement in IRF/cystic changes temporal fovea, interval improvement in vitreous opacities - recommend IVE HD OD #5 (SAMPLE) 01.17.25 with follow up in 4 weeks **OS on ~q32m maintenance treatment schedule -- due again ~March**  - RBA of procedure discussed, questions answered - see procedure note  - Eylea informed consent form re-signed and scanned on 05.16.2023  - Vabysmo informed consent form signed on 06.16.23  - Eylea HD informed consent obtained, resigned, and scanned on 10.25.24  - Eylea HD approved for 2024 -- but no  funding for Good Days  - f/u 4 weeks, DFE, OCT, possible injection(s)  5. Vitreous hemorrhage OS -- stably improved  - recurrent VH, onset 09.22.21  - etiology: secondary to PDR as described above (no RT/RD on exam)  - s/p PRP OS (9.20.19), (05.19.20), (08.19.20), (04.28.21), (02.02.22) - s/p IVA OS on 4.26.20, 5.29.20, 6.26.20, 8.5.20,11.11.20, 12.06.20 and so on as above   - Vit condensations stably improved  - BCVA stable at 20/25  - IVEs as above  - f/u 4 weeks DFE, OCT  6. History of Endophthalmitis OD  - s/p IVA OU 04/09/2018  - s/p 25g PPV w/ intravitreal vanc, ceftaz and cefepime OD, 2.14.2020  - s/p intravitreal tap / vanc and ceflaz injections  (02.16.20)             - gram stain (2.14.20) shows G+ cocci, WBCs mostly PMNs;   - repeat gram stain from t/i (2.16.20) -- no organisms, just WBCs             - cultures from vitreous grew rare Staph warneri; cultures from t/i -- no growth             - doing well, BCVA 20/25             - inflammation/posterior debris resolved  - monitor  7. History of Vitreous Hemorrhage OD -- cleared from PPV x2 for endophthalmitis and ERM/preretinal fibrosis   - secondary to PDR  8,9. Hypertensive retinopathy OU  - discussed importance of tight BP control.  - monitor  10. Combined form age related cataract OS - The symptoms of cataract, surgical options, and treatments and risks were discussed with patient.   - discussed diagnosis and progression  11. Pseudophakia OD  - s/p CE/IOL OD (Dr. Laruth Bouchard, 12.11.20)  - beautiful surgery, doing well  Ophthalmic Meds Ordered this visit:  Meds ordered this encounter  Medications   aflibercept (EYLEA HD) ophthalmic injection 8 mg     Return in about 4 weeks (around 04/13/2023) for f/u PDR OU, DFE, OCT, Possible Injxn.  There are no Patient Instructions on file for this visit.  This document serves as a record of services personally performed by Karie Chimera, MD, PhD. It was created on their behalf by Berlin Hun COT, an ophthalmic technician. The creation of this record is the provider's dictation and/or activities during the visit.    Electronically signed by: Berlin Hun COT 01.16.25 2:13 AM  This document serves as a record of services personally performed by Karie Chimera, MD, PhD. It was created on their behalf by Glee Arvin. Manson Passey, OA an ophthalmic technician. The creation of this record is the provider's dictation and/or activities during the visit.    Electronically signed by: Glee Arvin. Manson Passey, OA 03/18/23 2:13 AM  Karie Chimera, M.D., Ph.D. Diseases & Surgery of the Retina and Vitreous [facility] 03/16/2023   I have reviewed the above documentation for accuracy and completeness, and I agree with the above. Karie Chimera, M.D., Ph.D. 03/18/23 2:27 AM   Abbreviations: M myopia (nearsighted); A astigmatism; H hyperopia (farsighted); P presbyopia; Mrx spectacle prescription;  CTL contact lenses; OD right eye; OS left eye; OU both eyes  XT exotropia; ET esotropia; PEK punctate epithelial keratitis; PEE punctate epithelial erosions; DES dry eye syndrome; MGD meibomian gland dysfunction; ATs artificial tears; PFAT's preservative free artificial tears; NSC nuclear sclerotic cataract; PSC posterior subcapsular cataract; ERM epi-retinal membrane; PVD posterior vitreous detachment;  RD retinal detachment; DM diabetes mellitus; DR diabetic retinopathy; NPDR non-proliferative diabetic retinopathy; PDR proliferative diabetic retinopathy; CSME clinically significant macular edema; DME diabetic macular edema; dbh dot blot hemorrhages; CWS cotton wool spot; POAG primary open angle glaucoma; C/D cup-to-disc ratio; HVF humphrey visual field; GVF goldmann visual field; OCT optical coherence tomography; IOP intraocular pressure; BRVO Branch retinal vein occlusion; CRVO central retinal vein occlusion; CRAO central retinal artery occlusion; BRAO branch retinal artery occlusion; RT retinal tear; SB scleral buckle; PPV pars plana vitrectomy; VH Vitreous hemorrhage; PRP panretinal laser photocoagulation; IVK intravitreal kenalog; VMT vitreomacular traction; MH Macular hole;  NVD neovascularization of the disc; NVE neovascularization elsewhere; AREDS age related eye disease study; ARMD age related macular degeneration; POAG primary open angle glaucoma; EBMD epithelial/anterior basement membrane dystrophy; ACIOL anterior chamber intraocular lens; IOL intraocular lens; PCIOL posterior chamber intraocular lens; Phaco/IOL phacoemulsification with intraocular lens placement; PRK photorefractive keratectomy; LASIK laser  assisted in situ keratomileusis; HTN hypertension; DM diabetes mellitus; COPD chronic obstructive pulmonary disease

## 2023-03-16 ENCOUNTER — Ambulatory Visit (INDEPENDENT_AMBULATORY_CARE_PROVIDER_SITE_OTHER): Payer: Medicare Other | Admitting: Ophthalmology

## 2023-03-16 ENCOUNTER — Encounter (INDEPENDENT_AMBULATORY_CARE_PROVIDER_SITE_OTHER): Payer: Self-pay | Admitting: Ophthalmology

## 2023-03-16 DIAGNOSIS — H4311 Vitreous hemorrhage, right eye: Secondary | ICD-10-CM

## 2023-03-16 DIAGNOSIS — E113513 Type 2 diabetes mellitus with proliferative diabetic retinopathy with macular edema, bilateral: Secondary | ICD-10-CM

## 2023-03-16 DIAGNOSIS — Z961 Presence of intraocular lens: Secondary | ICD-10-CM

## 2023-03-16 DIAGNOSIS — Z7985 Long-term (current) use of injectable non-insulin antidiabetic drugs: Secondary | ICD-10-CM

## 2023-03-16 DIAGNOSIS — I10 Essential (primary) hypertension: Secondary | ICD-10-CM

## 2023-03-16 DIAGNOSIS — H4312 Vitreous hemorrhage, left eye: Secondary | ICD-10-CM

## 2023-03-16 DIAGNOSIS — Z794 Long term (current) use of insulin: Secondary | ICD-10-CM

## 2023-03-16 DIAGNOSIS — H25812 Combined forms of age-related cataract, left eye: Secondary | ICD-10-CM

## 2023-03-16 DIAGNOSIS — H44001 Unspecified purulent endophthalmitis, right eye: Secondary | ICD-10-CM

## 2023-03-16 DIAGNOSIS — H35033 Hypertensive retinopathy, bilateral: Secondary | ICD-10-CM

## 2023-03-16 DIAGNOSIS — Z7984 Long term (current) use of oral hypoglycemic drugs: Secondary | ICD-10-CM | POA: Diagnosis not present

## 2023-03-16 DIAGNOSIS — H4313 Vitreous hemorrhage, bilateral: Secondary | ICD-10-CM

## 2023-03-16 MED ORDER — AFLIBERCEPT 8 MG/0.07ML IZ SOLN
8.0000 mg | INTRAVITREAL | Status: AC | PRN
  Administered 2023-03-16: 8 mg via INTRAVITREAL

## 2023-04-02 DIAGNOSIS — M353 Polymyalgia rheumatica: Secondary | ICD-10-CM | POA: Diagnosis not present

## 2023-04-02 DIAGNOSIS — R700 Elevated erythrocyte sedimentation rate: Secondary | ICD-10-CM | POA: Diagnosis not present

## 2023-04-02 DIAGNOSIS — M797 Fibromyalgia: Secondary | ICD-10-CM | POA: Diagnosis not present

## 2023-04-02 DIAGNOSIS — R21 Rash and other nonspecific skin eruption: Secondary | ICD-10-CM | POA: Diagnosis not present

## 2023-04-07 ENCOUNTER — Other Ambulatory Visit: Payer: Self-pay | Admitting: Family Medicine

## 2023-04-09 DIAGNOSIS — E1165 Type 2 diabetes mellitus with hyperglycemia: Secondary | ICD-10-CM | POA: Diagnosis not present

## 2023-04-09 LAB — HEMOGLOBIN A1C: Hemoglobin A1C: 6.5

## 2023-04-11 NOTE — Progress Notes (Signed)
[facility] - Clinic Note  04/13/2023     CHIEF COMPLAINT Patient presents for Retina Follow Up  HISTORY OF PRESENT ILLNESS: Gloria Gomez is a 53 y.o. female who presents to the clinic today for:  HPI     Retina Follow Up   Patient presents with  Diabetic Retinopathy.  In both eyes.  Severity is mild.  Duration of 4 weeks.  Since onset it is stable.  I, the attending physician,  performed the HPI with the patient and updated documentation appropriately.        Comments   4 week Retina eval. Patient states no vision changes      Last edited by Rennis Chris, MD on 04/13/2023  1:00 PM.     Patient states vision seems the same  Referring physician:   HISTORICAL INFORMATION:   Selected notes from the MEDICAL RECORD NUMBER Referred for DM exam   CURRENT MEDICATIONS: No current outpatient medications on file. (Ophthalmic Drugs)   No current facility-administered medications for this visit. (Ophthalmic Drugs)   Current Outpatient Medications (Other)  Medication Sig   amLODipine (NORVASC) 5 MG tablet Take 1 tablet (5 mg total) by mouth daily.   Continuous Blood Gluc Sensor (FREESTYLE LIBRE 3 SENSOR) MISC by Does not apply route every 14 (fourteen) days.   FARXIGA 10 MG TABS tablet Take 10 mg by mouth daily.   furosemide (LASIX) 20 MG tablet TAKE 2 TABLETS BY MOUTH IN THE MORNING AND 1 IN THE EVENING   gabapentin (NEURONTIN) 300 MG capsule Take 1 capsule (300 mg total) by mouth 3 (three) times daily.   HUMALOG KWIKPEN 100 UNIT/ML KwikPen Inject into the skin.   HYDROcodone-acetaminophen (NORCO/VICODIN) 5-325 MG tablet Take 1-2 tablets by mouth every 6 (six) hours as needed for moderate pain.   insulin aspart (NOVOLOG) 100 UNIT/ML injection Inject 10 Units into the skin 2 (two) times daily with a meal. Per endocrinologist   Insulin Glargine (BASAGLAR KWIKPEN) 100 UNIT/ML 10 units every morning and 24 units nightly   losartan (COZAAR) 100 MG tablet Take  1 tablet by mouth once daily   meloxicam (MOBIC) 15 MG tablet Take 1 tablet daily as needed.   metFORMIN (GLUCOPHAGE) 1000 MG tablet TAKE 1 TABLET BY MOUTH TWICE DAILY WITH MEALS   metoCLOPramide (REGLAN) 5 MG tablet TAKE 1 TABLET BY MOUTH 4 TIMES DAILY BEFORE MEAL(S) AND AT BEDTIME   metoprolol succinate (TOPROL-XL) 100 MG 24 hr tablet Take 2 tablets by mouth once daily   Multiple Vitamin (MULTIVITAMIN WITH MINERALS) TABS tablet Take 1 tablet by mouth daily.   NOVOLOG FLEXPEN 100 UNIT/ML FlexPen    pantoprazole (PROTONIX) 40 MG tablet Take 1 tablet by mouth once daily   promethazine (PHENERGAN) 12.5 MG tablet TAKE 1 TO 2 TABLETS BY MOUTH EVERY 6 HOURS AS NEEDED FOR NAUSEA   rosuvastatin (CRESTOR) 20 MG tablet Take 1 tablet (20 mg total) by mouth daily.   TRULICITY 1.5 MG/0.5ML SOPN Inject into the skin.   verapamil (CALAN-SR) 240 MG CR tablet Take by mouth.   verapamil (CALAN-SR) 240 MG CR tablet TAKE 1 TABLET BY MOUTH ONCE DAILY AT BEDTIME   VICTOZA 18 MG/3ML SOPN INJECT 1.2MG  INTO THE SKIN DAILY   No current facility-administered medications for this visit. (Other)   REVIEW OF SYSTEMS: ROS   Positive for: Endocrine, Eyes Negative for: Constitutional, Gastrointestinal, Neurological, Skin, Genitourinary, Musculoskeletal, HENT, Cardiovascular, Respiratory, Psychiatric, Allergic/Imm, Heme/Lymph Last edited by Lana Fish, COT on 04/13/2023  12:37 PM.       ALLERGIES Allergies  Allergen Reactions   Gluten Meal Swelling   Lisinopril Cough   Pioglitazone Other (See Comments)    ELEVATED glucoses + worse chronic nausea   PAST MEDICAL HISTORY Past Medical History:  Diagnosis Date   Abdominal bloating    likely from diab gastroparesis.  Dr. Adela Lank started trial of reglan 03/2019.   Benign brain tumor (HCC)    Cystic lesion in cerebral aqueduct region with mild hydrocephalus-- stable MRI 02/2016.  Surveillance MRI 05/2017 --dilated cerebral aqueduct related to aqueductal stenosis  and subsequent mild hydrocephalus (due to the 11 mm stable cystic lesion in cerebral aqueduct---?congenitial?.   Cataract    OU   Dysmenorrhea    vicodin occ during first 2 days of cycle.   Fibromyalgia    Gluten intolerance    pt reports she underwent full GI w/u to r/o celiac dz   Hepatic steatosis    ultrasound 08/2017. Hx of very mild elevation of ALT.  Stable on u/s 06/2020   History of adenomatous polyp of colon 04/08/2019   recall Feb 2024   Hyperlipidemia, mixed    Hypertension    +white coat component   Hypertensive retinopathy of both eyes    Insomnia    Iron deficiency 01/2019   Hb 11.3. Hemoccults neg x 3 03/19/19. EGD and colonoscopy 04/08/19 showed NO cause for IDA.  Pt does have menorrhagia, though, so she'll see her GYN.  started FeSO4 325 qd approx 04/14/19.   Menorrhagia    resulting in IDA 2021   PMR (polymyalgia rheumatica) (HCC) 2022   question of; hx of elevated ESR-->better with prednisone but prednisone was contraindicated d/t eye issues/hyperglycemia-->rheum started following her 02/2021->plaquenil 04/2021   Proliferative diabetic retinopathy of both eyes (HCC)    steroid injections 10/2017--improved   Sensorineural hearing loss of left ear    Sudden left hearing loss summer 2016--no improvement with steroids 01/2015 so brain MRI done by Dr. Jenne Pane and it showed brain tumor that was determined to be benign.  Pt's hearing not bad enough for hearing aid as of 06/2016.   Type 2 diabetes with complication (HCC)    +microalbuminuria, diab retpthy, diabetic gastroparesis (gastric emptying study mildly abnl 03/2017).  Recommended lantus 08/2018 but pt declined. Mild microalbuminuria.   Uterine fibroid 2022   per pt report, pelvic u/s in GYN office   Past Surgical History:  Procedure Laterality Date   ANOSCOPY  05/12/2019   Procedure: normal exam, minimal hemorrhoid disease. Hyertrophied anal papila, benign appearing, posterior midline. Surgeon: Romie Levee MD    CHOLECYSTECTOMY  2000   COLONOSCOPY  04/08/2019   5 adenomas, recall 3 yrs; no cause for IDA found.  Hypertrophied anal papillae->bx showed low grade dysplasia; GI referred her to colorectal surgeon.   ESOPHAGOGASTRODUODENOSCOPY  04/08/2019   mild chronic reactive gastritis. H pylori NEG.  No cause for IDA found.   GAS INSERTION Right 10/31/2018   Procedure: Insertion Of C3F8 Gas;  Surgeon: Rennis Chris, MD;  Location: [facility] OR;  Service: Ophthalmology;  Laterality: Right;   GASTRIC EMPTYING SCAN  04/20/2017   Mildly abnormal, particularly the 1st hour of emptying.   MEMBRANE PEEL Right 10/31/2018   Procedure: MEMBRANE PEEL;  Surgeon: Rennis Chris, MD;  Location: [facility] OR;  Service: Ophthalmology;  Laterality: Right;   PARS PLANA VITRECTOMY Right 04/12/2018   Procedure: Right PARS PLANA VITRECTOMY WITH 25 GAUGE with intravitreal antibiotics;  Surgeon: Rennis Chris, MD;  Location: [facility]  OR;  Service: Ophthalmology;  Laterality: Right;   PARS PLANA VITRECTOMY Right 10/31/2018   Procedure: PARS PLANA VITRECTOMY WITH 25 GAUGE;  Surgeon: Rennis Chris, MD;  Location: [facility] OR;  Service: Ophthalmology;  Laterality: Right;   PHOTOCOAGULATION WITH LASER Right 10/31/2018   Procedure: Photocoagulation With Laser;  Surgeon: Rennis Chris, MD;  Location: [facility] OR;  Service: Ophthalmology;  Laterality: Right;   TRANSTHORACIC ECHOCARDIOGRAM  08/23/2020   Grd I DD, o/w normal.   FAMILY HISTORY Family History  Problem Relation Age of Onset   Brain cancer Mother    Diabetes Father    Diabetes Maternal Grandmother    Cataracts Maternal Grandmother    Cervical cancer Paternal Grandmother    Colon cancer Maternal Grandfather 39   Amblyopia Neg Hx    Blindness Neg Hx    Glaucoma Neg Hx    Macular degeneration Neg Hx    Retinal detachment Neg Hx    Strabismus Neg Hx    Retinitis pigmentosa Neg Hx    Esophageal cancer Neg Hx    Stomach cancer Neg Hx    Rectal cancer Neg Hx    SOCIAL HISTORY Social History    Tobacco Use   Smoking status: Never   Smokeless tobacco: Never  Vaping Use   Vaping status: Never Used  Substance Use Topics   Alcohol use: No   Drug use: No       OPHTHALMIC EXAM:  Base Eye Exam     Visual Acuity (Snellen - Linear)       Right Left   Dist cc 20/20-2 20/20-1    Correction: Glasses         Tonometry (Tonopen, 12:42 PM)       Right Left   Pressure 10 12         Pupils       Dark Light Shape React APD   Right 3 2 Round Brisk None   Left 3 2 Round Brisk None         Visual Fields (Counting fingers)       Left Right    Full Full         Extraocular Movement       Right Left    Full, Ortho Full, Ortho         Neuro/Psych     Oriented x3: Yes   Mood/Affect: Normal         Dilation     Both eyes: 1.0% Mydriacyl, 2.5% Phenylephrine @ 12:41 PM           Slit Lamp and Fundus Exam     Slit Lamp Exam       Right Left   Lids/Lashes Dermatochalasis - upper lid, mild Meibomian gland dysfunction, Telangiectasia Dermatochalasis - upper lid, Meibomian gland dysfunction, Telangiectasia   Conjunctiva/Sclera White and quiet White and quiet   Cornea Clear, well healed temporal cataract wounds Trace Punctate epithelial erosions, trace EBMD   Anterior Chamber Deep and quiet Deep and quiet   Iris Round and dilated, No NVI Round and dilated, No NVI   Lens PC IOL in good position, 1-2+ Posterior capsular opacification, PC folds 2-3+ Nuclear sclerosis with mild brunescence, 2-3+ Cortical cataract, 1+ Posterior subcapsular cataract   Anterior Vitreous post vitrectomy, trace pigment Vitreous syneresis, vitreous condensations, no frank red heme, old white VH clearing and settled inferiorly -- improved         Fundus Exam       Right Left  Disc mild Pallor, Sharp rim, temporal PPA Pink and sharp, Compact, PPA   C/D Ratio 0.2 0.1   Macula Flat, good foveal reflex, mild cystic changes temporal macula -- persistent, focal MA and  exudates ST mac -- improved, +focal laser changes Flat, good foveal reflex, trace cystic changes -- stably improved, scattered MA; trace ERM, light focal laser changes   Vessels attenuated, Tortuous attenuated, Tortuous   Periphery Attached, rare MA, scattered DBH greatest posteriorly, 360 PRP, good laser fill in 360 attached, scattered IRH; 360 PRP scars -- with good fill in changes           Refraction     Wearing Rx       Sphere Cylinder   Right -0.25 Sphere   Left -4.50 Sphere           IMAGING AND PROCEDURES  Imaging and Procedures for   OCT, Retina - OU - Both Eyes       Right Eye Quality was good. Central Foveal Thickness: 303. Progression has improved. Findings include no SRF, abnormal foveal contour, intraretinal hyper-reflective material, epiretinal membrane, intraretinal fluid (persistent IRF/cystic changes temporal macula and fovea -- slightly improved).   Left Eye Quality was good. Central Foveal Thickness: 276. Progression has improved. Findings include normal foveal contour, no IRF, no SRF, intraretinal hyper-reflective material (stable improvement in IRF/cystic changes temporal fovea, interval improvement in vitreous opacities).   Notes *Images captured and stored on drive  Diagnosis / Impression:  +DME OU OD: persistent IRF/cystic changes temporal macula and fovea -- slightly improved  OS: stable improvement in IRF/cystic changes temporal fovea, interval improvement in vitreous opacities  Clinical management:  See below  Abbreviations: NFP - Normal foveal profile. CME - cystoid macular edema. PED - pigment epithelial detachment. IRF - intraretinal fluid. SRF - subretinal fluid. EZ - ellipsoid zone. ERM - epiretinal membrane. ORA - outer retinal atrophy. ORT - outer retinal tubulation. SRHM - subretinal hyper-reflective material       Intravitreal Injection, Pharmacologic Agent - OD - Right Eye       Time Out 04/13/2023. 1:10 PM. Confirmed correct  patient, procedure, site, and patient consented.   Anesthesia Topical anesthesia was used. Anesthetic medications included Lidocaine 2%, Proparacaine 0.5%.   Procedure Preparation included 5% betadine to ocular surface, eyelid speculum. A (32g) needle was used.   Injection: 8 mg aflibercept 8 MG/0.07ML (Patient supplied)   Route: Intravitreal, Site: Right Eye   NDC: [id number], Lot: [id number], Expiration date: 05/28/2023, Waste: 0 mL   Post-op Post injection exam found visual acuity of at least counting fingers. The patient tolerated the procedure well. There were no complications. The patient received written and verbal post procedure care education. Post injection medications were not given.   Notes **SAMPLE MEDICATION ADMINISTERED**            ASSESSMENT/PLAN:   ICD-10-CM   1. Proliferative diabetic retinopathy of both eyes with macular edema associated with type 2 diabetes mellitus (HCC)  E11.3513 OCT, Retina - OU - Both Eyes    Intravitreal Injection, Pharmacologic Agent - OD - Right Eye    aflibercept (EYLEA HD) ophthalmic injection 8 mg    2. Current use of insulin (HCC)  Z79.4     3. Long term (current) use of oral hypoglycemic drugs  Z79.84     4. Long-term (current) use of injectable non-insulin antidiabetic drugs  Z79.85     5. Vitreous hemorrhage of left eye (HCC)  H43.12     6.  Vitreous hemorrhage, right eye (HCC)  H43.11     7. Hypertensive retinopathy of both eyes  H35.033     8. Essential hypertension  I10     9. Right endophthalmia  H44.001     10. Combined forms of age-related cataract of left eye  H25.812     11. Pseudophakia of right eye  Z96.1      1-4.  Proliferative diabetic retinopathy w/ DME, OU - HbA1c 7.9 (10.08.24),8.6 (06.11.24), 7.1 (3.23.23), 8.6 (9.14.22), 7.8 (06.13.22) - s/p IVA OD #1 9.20.19, #2 (10.25.19), #3 (11.15.19), #4 (12.17.19), #5 (01.14.20), #6 (2.11.20), #7 (05.29.20), #8 (08.19.20), #9 (10.30.20), #10 (12.09.20),  #11 (01.11.21), #12 (02.15.21), #13 (03.23.21), #14 (04.20.21), #15 (06.08.21), #16 (07.06.21) -- IVA resistance - s/p IVA OS #1 9.27.19, #2 (10.25.19), #3 (11.15.19), #4 (12.17.19), #5 (01.14.20), #6 (2.11.20), #7 (04.26.20), #8 (05.29.20), #9 (06.26.20), #10 (08.05.20), #11 (11.11.20), #12 (12.09.20), #13 (01.11.21), #14 (02.15.21), #15 (03.23.21), #16 (04.20.21), #17 (06.08.21) -- IVA resistance, #18 (09.27.21) ============================================================ - s/p IVE OD #1 (08.03.21) -- sample, #2 (09.10.21), #3 (10.08.21), #4 (11.23.21), #5 (12.21.21), #6 (01.19.22) sample, #7 (2.16.22), #8 (3.22.22), #9 (04.19.22), #10 (05.27.22), #11 (06.29.22), #12 (08.03.22), #13 (09.07.22), #14 (10.10.22), #15 (11.14.22), #16 (12.12.22), #17 (01.10.23) - sample, #18 (02.07.23), #19 (03.21.23), #20 (04.18.23), #21 (05.16.23) -- IVE resistance ============================================================= - s/p IVE OS #1 (10.25.21), #2 (11.23.21), #3 (12.21.21), #4 (3.22.22), #5 (06.29.22), #6 (08.03.22), #7 (09.07.22), #8 (10.10.22), #9 (11.14.22), #10 (02.07.23), #11 (05.16.23), #12 (08.15.23), #13 (11.14.23), #14 (02.06.24), #15 (05.06.24), #16 SAMPLE (07.01.24), #17 (09.26.24), #18 (12.20.24) ============================================================ - IVV OD #1 (06.16.23 -- sample), #2 (07.17.23), #3 (08.15.23), #4 (09.12.23), #5 (10.10.23), #6 (11.14.23), #7 (12.12.23), #8 (01.09.23), #9 (02.06.24), #10 (03.11.24), #11 (04.08.24), #12 (05.06.24), #13 (06.03.24), #14 (07.01.24), #15 (07.29.24), #16 (08.26.24)- IVV resistance ============================================================== - s/p IVE HD OD #1 (sample 09.26.24), #2 (10.25.24), #3 (11.21.24), #4 (12.20.24), #5 (01.17.25 -- sample)  - S/P PRP OS (09.20.19), (5.19.20), (08.19.20), (04.28.21), (02.02.22)  - S/P PRP OD (9.27.19 and 11.21.19), fill-in (04.14.20) (09.03.20, surgery)  - S/P focal laser OS (07.06.21), OD (09.20.22)  - FA  (9.20.19) shows +NVE OU and leaking MA and capillary nonperfusion  - repeat FA 11.15.19 shows NV regressing OU - pre-op: OD w/ VA down at 20/25, but there is some preretinal fibrosis / tractional membranes just superior to disc and mild central DME  - s/p 25g PPV+MP+10% C3F8 gas OD (09.03.20) -- ERM/PRF removal OD  - BCVA: improved to 20/20 OU from 20/25 OU             - fibrosis/ERM stably improved; retina attached - OCT shows OD: mild interval improvement in IRF/cystic changes temporal macula and fovea; OS: stable improvement in IRF/cystic changes temporal fovea, interval improvement in vitreous opacities at 4 wks - recommend IVE HD OD #6 (SAMPLE) 02.14.25 with follow up in 4 weeks - [employer] funding unavailable **OS on ~q70m maintenance treatment schedule -- due again ~March**  - RBA of procedure discussed, questions answered - see procedure note  - Eylea informed consent form re-signed and scanned on 05.16.2023  - Vabysmo informed consent form signed on 06.16.23  - Eylea HD informed consent obtained, resigned, and scanned on 10.25.24  - Eylea HD approved for 2024 -- but no funding for [employer]  - f/u 4 weeks, DFE, OCT, possible injection(s)  5. Vitreous hemorrhage OS -- stably improved  - recurrent VH, onset 09.22.21  - etiology: secondary to PDR as described above (no RT/RD on exam)  -  s/p PRP OS (9.20.19), (05.19.20), (08.19.20), (04.28.21), (02.02.22) - s/p IVA OS on 4.26.20, 5.29.20, 6.26.20, 8.5.20,11.11.20, 12.06.20 and so on as above   - Vit condensations stably improved  - BCVA stable at 20/25  - IVEs as above  - f/u 4 weeks DFE, OCT  6. History of Endophthalmitis OD  - s/p IVA OU 04/09/2018  - s/p 25g PPV w/ intravitreal vanc, ceftaz and cefepime OD, 2.14.2020  - s/p intravitreal tap / vanc and ceflaz injections (02.16.20)             - gram stain (2.14.20) shows G+ cocci, WBCs mostly PMNs;   - repeat gram stain from t/i (2.16.20) -- no organisms, just WBCs             -  cultures from vitreous grew rare Staph warneri; cultures from t/i -- no growth             - doing well, BCVA 20/25             - inflammation/posterior debris resolved  - monitor  7. History of Vitreous Hemorrhage OD -- cleared from PPV x2 for endophthalmitis and ERM/preretinal fibrosis   - secondary to PDR  8,9. Hypertensive retinopathy OU  - discussed importance of tight BP control.  - monitor  10. Combined form age related cataract OS - The symptoms of cataract, surgical options, and treatments and risks were discussed with patient.   - discussed diagnosis and progression  11. Pseudophakia OD  - s/p CE/IOL OD (Dr. Laruth Bouchard, 12.11.20)  - beautiful surgery, doing well  Ophthalmic Meds Ordered this visit:  Meds ordered this encounter  Medications   aflibercept (EYLEA HD) ophthalmic injection 8 mg     Return in about 4 weeks (around 05/11/2023) for f/u PDR OU, DFE, OCT, Possible Injxn.  There are no Patient Instructions on file for this visit.  This document serves as a record of services personally performed by Karie Chimera, MD, PhD. It was created on their behalf by Berlin Hun COT, an ophthalmic technician. The creation of this record is the provider's dictation and/or activities during the visit.    Electronically signed by: Berlin Hun COT 02.12.25 11:53 PM  This document serves as a record of services personally performed by Karie Chimera, MD, PhD. It was created on their behalf by Glee Arvin. Manson Passey, OA an ophthalmic technician. The creation of this record is the provider's dictation and/or activities during the visit.    Electronically signed by: Glee Arvin. Manson Passey, OA 04/13/23 11:53 PM  Karie Chimera, M.D., Ph.D. Diseases & Surgery of the Retina and Vitreous [facility] 04/13/2023   I have reviewed the above documentation for accuracy and completeness, and I agree with the above. Karie Chimera, M.D., Ph.D. 04/13/23 11:54  PM   Abbreviations: M myopia (nearsighted); A astigmatism; H hyperopia (farsighted); P presbyopia; Mrx spectacle prescription;  CTL contact lenses; OD right eye; OS left eye; OU both eyes  XT exotropia; ET esotropia; PEK punctate epithelial keratitis; PEE punctate epithelial erosions; DES dry eye syndrome; MGD meibomian gland dysfunction; ATs artificial tears; PFAT's preservative free artificial tears; NSC nuclear sclerotic cataract; PSC posterior subcapsular cataract; ERM epi-retinal membrane; PVD posterior vitreous detachment; RD retinal detachment; DM diabetes mellitus; DR diabetic retinopathy; NPDR non-proliferative diabetic retinopathy; PDR proliferative diabetic retinopathy; CSME clinically significant macular edema; DME diabetic macular edema; dbh dot blot hemorrhages; CWS cotton wool spot; POAG primary open angle glaucoma; C/D cup-to-disc ratio;  HVF humphrey visual field; GVF goldmann visual field; OCT optical coherence tomography; IOP intraocular pressure; BRVO Branch retinal vein occlusion; CRVO central retinal vein occlusion; CRAO central retinal artery occlusion; BRAO branch retinal artery occlusion; RT retinal tear; SB scleral buckle; PPV pars plana vitrectomy; VH Vitreous hemorrhage; PRP panretinal laser photocoagulation; IVK intravitreal kenalog; VMT vitreomacular traction; MH Macular hole;  NVD neovascularization of the disc; NVE neovascularization elsewhere; AREDS age related eye disease study; ARMD age related macular degeneration; POAG primary open angle glaucoma; EBMD epithelial/anterior basement membrane dystrophy; ACIOL anterior chamber intraocular lens; IOL intraocular lens; PCIOL posterior chamber intraocular lens; Phaco/IOL phacoemulsification with intraocular lens placement; PRK photorefractive keratectomy; LASIK laser assisted in situ keratomileusis; HTN hypertension; DM diabetes mellitus; COPD chronic obstructive pulmonary disease

## 2023-04-13 ENCOUNTER — Encounter (INDEPENDENT_AMBULATORY_CARE_PROVIDER_SITE_OTHER): Payer: Self-pay | Admitting: Ophthalmology

## 2023-04-13 ENCOUNTER — Ambulatory Visit (INDEPENDENT_AMBULATORY_CARE_PROVIDER_SITE_OTHER): Payer: Medicare Other | Admitting: Ophthalmology

## 2023-04-13 DIAGNOSIS — I10 Essential (primary) hypertension: Secondary | ICD-10-CM

## 2023-04-13 DIAGNOSIS — H35033 Hypertensive retinopathy, bilateral: Secondary | ICD-10-CM

## 2023-04-13 DIAGNOSIS — H44001 Unspecified purulent endophthalmitis, right eye: Secondary | ICD-10-CM

## 2023-04-13 DIAGNOSIS — E113513 Type 2 diabetes mellitus with proliferative diabetic retinopathy with macular edema, bilateral: Secondary | ICD-10-CM

## 2023-04-13 DIAGNOSIS — Z961 Presence of intraocular lens: Secondary | ICD-10-CM

## 2023-04-13 DIAGNOSIS — Z794 Long term (current) use of insulin: Secondary | ICD-10-CM

## 2023-04-13 DIAGNOSIS — Z7985 Long-term (current) use of injectable non-insulin antidiabetic drugs: Secondary | ICD-10-CM | POA: Diagnosis not present

## 2023-04-13 DIAGNOSIS — H25812 Combined forms of age-related cataract, left eye: Secondary | ICD-10-CM

## 2023-04-13 DIAGNOSIS — H4311 Vitreous hemorrhage, right eye: Secondary | ICD-10-CM

## 2023-04-13 DIAGNOSIS — H4312 Vitreous hemorrhage, left eye: Secondary | ICD-10-CM

## 2023-04-13 DIAGNOSIS — Z7984 Long term (current) use of oral hypoglycemic drugs: Secondary | ICD-10-CM

## 2023-04-13 DIAGNOSIS — H4313 Vitreous hemorrhage, bilateral: Secondary | ICD-10-CM

## 2023-04-13 MED ORDER — AFLIBERCEPT 8 MG/0.07ML IZ SOLN
8.0000 mg | INTRAVITREAL | Status: AC | PRN
  Administered 2023-04-13: 8 mg via INTRAVITREAL

## 2023-04-16 DIAGNOSIS — I10 Essential (primary) hypertension: Secondary | ICD-10-CM | POA: Diagnosis not present

## 2023-04-16 DIAGNOSIS — E1165 Type 2 diabetes mellitus with hyperglycemia: Secondary | ICD-10-CM | POA: Diagnosis not present

## 2023-04-16 DIAGNOSIS — E785 Hyperlipidemia, unspecified: Secondary | ICD-10-CM | POA: Diagnosis not present

## 2023-04-24 ENCOUNTER — Other Ambulatory Visit: Payer: Self-pay | Admitting: Family Medicine

## 2023-05-02 NOTE — Progress Notes (Signed)
 [facility] - Clinic Note  05/11/2023     CHIEF COMPLAINT Patient presents for Retina Follow Up  HISTORY OF PRESENT ILLNESS: Gloria Gomez is a 53 y.o. female who presents to the clinic today for:  HPI     Retina Follow Up   Patient presents with  Diabetic Retinopathy.  In both eyes.  This started 4 weeks ago.  I, the attending physician,  performed the HPI with the patient and updated documentation appropriately.        Comments   Patient here for 4 weeks retina follow up for PDR OU. Patient states vision doing good. About the same. No eye pain. A1C 6.5 on March 7 th.      Last edited by Rennis Chris, MD on 05/11/2023  4:53 PM.    Patient states vision seems good. Last A1c was 6.5!  Referring physician:   HISTORICAL INFORMATION:   Selected notes from the MEDICAL RECORD NUMBER Referred for DM exam   CURRENT MEDICATIONS: No current outpatient medications on file. (Ophthalmic Drugs)   No current facility-administered medications for this visit. (Ophthalmic Drugs)   Current Outpatient Medications (Other)  Medication Sig   amLODipine (NORVASC) 5 MG tablet Take 1 tablet (5 mg total) by mouth daily.   Continuous Blood Gluc Sensor (FREESTYLE LIBRE 3 SENSOR) MISC by Does not apply route every 14 (fourteen) days.   FARXIGA 10 MG TABS tablet Take 10 mg by mouth daily.   furosemide (LASIX) 20 MG tablet TAKE 2 TABLETS BY MOUTH IN THE MORNING AND 1 IN THE EVENING   gabapentin (NEURONTIN) 300 MG capsule Take 1 capsule (300 mg total) by mouth 3 (three) times daily.   HUMALOG KWIKPEN 100 UNIT/ML KwikPen Inject into the skin.   HYDROcodone-acetaminophen (NORCO/VICODIN) 5-325 MG tablet Take 1-2 tablets by mouth every 6 (six) hours as needed for moderate pain.   insulin aspart (NOVOLOG) 100 UNIT/ML injection Inject 10 Units into the skin 2 (two) times daily with a meal. Per endocrinologist   Insulin Glargine (BASAGLAR KWIKPEN) 100 UNIT/ML 10 units every morning  and 24 units nightly   losartan (COZAAR) 100 MG tablet Take 1 tablet by mouth once daily   meloxicam (MOBIC) 15 MG tablet Take 1 tablet daily as needed.   metFORMIN (GLUCOPHAGE) 1000 MG tablet TAKE 1 TABLET BY MOUTH TWICE DAILY WITH MEALS   metoCLOPramide (REGLAN) 5 MG tablet TAKE 1 TABLET BY MOUTH 4 TIMES DAILY BEFORE MEAL(S) AND AT BEDTIME   metoprolol succinate (TOPROL-XL) 100 MG 24 hr tablet Take 2 tablets by mouth once daily   Multiple Vitamin (MULTIVITAMIN WITH MINERALS) TABS tablet Take 1 tablet by mouth daily.   NOVOLOG FLEXPEN 100 UNIT/ML FlexPen    pantoprazole (PROTONIX) 40 MG tablet Take 1 tablet by mouth once daily   promethazine (PHENERGAN) 12.5 MG tablet TAKE 1 TO 2 TABLETS BY MOUTH EVERY 6 HOURS AS NEEDED FOR NAUSEA   rosuvastatin (CRESTOR) 20 MG tablet Take 1 tablet (20 mg total) by mouth daily.   TRULICITY 1.5 MG/0.5ML SOPN Inject into the skin.   verapamil (CALAN-SR) 240 MG CR tablet Take by mouth.   verapamil (CALAN-SR) 240 MG CR tablet TAKE 1 TABLET BY MOUTH ONCE DAILY AT BEDTIME   VICTOZA 18 MG/3ML SOPN INJECT 1.2MG  INTO THE SKIN DAILY   No current facility-administered medications for this visit. (Other)   REVIEW OF SYSTEMS: ROS   Positive for: Endocrine, Eyes Negative for: Constitutional, Gastrointestinal, Neurological, Skin, Genitourinary, Musculoskeletal, HENT, Cardiovascular, Respiratory,  Psychiatric, Allergic/Imm, Heme/Lymph Last edited by Laddie Aquas, COA on 05/11/2023  2:15 PM.     ALLERGIES Allergies  Allergen Reactions   Gluten Meal Swelling   Lisinopril Cough   Pioglitazone Other (See Comments)    ELEVATED glucoses + worse chronic nausea   PAST MEDICAL HISTORY Past Medical History:  Diagnosis Date   Abdominal bloating    likely from diab gastroparesis.  Dr. Adela Lank started trial of reglan 03/2019.   Benign brain tumor (HCC)    Cystic lesion in cerebral aqueduct region with mild hydrocephalus-- stable MRI 02/2016.  Surveillance MRI 05/2017  --dilated cerebral aqueduct related to aqueductal stenosis and subsequent mild hydrocephalus (due to the 11 mm stable cystic lesion in cerebral aqueduct---?congenitial?.   Cataract    OU   Dysmenorrhea    vicodin occ during first 2 days of cycle.   Fibromyalgia    Gluten intolerance    pt reports she underwent full GI w/u to r/o celiac dz   Hepatic steatosis    ultrasound 08/2017. Hx of very mild elevation of ALT.  Stable on u/s 06/2020   History of adenomatous polyp of colon 04/08/2019   recall Feb 2024   Hyperlipidemia, mixed    Hypertension    +white coat component   Hypertensive retinopathy of both eyes    Insomnia    Iron deficiency 01/2019   Hb 11.3. Hemoccults neg x 3 03/19/19. EGD and colonoscopy 04/08/19 showed NO cause for IDA.  Pt does have menorrhagia, though, so she'll see her GYN.  started FeSO4 325 qd approx 04/14/19.   Menorrhagia    resulting in IDA 2021   PMR (polymyalgia rheumatica) (HCC) 2022   question of; hx of elevated ESR-->better with prednisone but prednisone was contraindicated d/t eye issues/hyperglycemia-->rheum started following her 02/2021->plaquenil 04/2021   Proliferative diabetic retinopathy of both eyes (HCC)    steroid injections 10/2017--improved   Sensorineural hearing loss of left ear    Sudden left hearing loss summer 2016--no improvement with steroids 01/2015 so brain MRI done by Dr. Jenne Pane and it showed brain tumor that was determined to be benign.  Pt's hearing not bad enough for hearing aid as of 06/2016.   Type 2 diabetes with complication (HCC)    +microalbuminuria, diab retpthy, diabetic gastroparesis (gastric emptying study mildly abnl 03/2017).  Recommended lantus 08/2018 but pt declined. Mild microalbuminuria.   Uterine fibroid 2022   per pt report, pelvic u/s in GYN office   Past Surgical History:  Procedure Laterality Date   ANOSCOPY  05/12/2019   Procedure: normal exam, minimal hemorrhoid disease. Hyertrophied anal papila, benign appearing,  posterior midline. Surgeon: Romie Levee MD   CHOLECYSTECTOMY  2000   COLONOSCOPY  04/08/2019   5 adenomas, recall 3 yrs; no cause for IDA found.  Hypertrophied anal papillae->bx showed low grade dysplasia; GI referred her to colorectal surgeon.   ESOPHAGOGASTRODUODENOSCOPY  04/08/2019   mild chronic reactive gastritis. H pylori NEG.  No cause for IDA found.   GAS INSERTION Right 10/31/2018   Procedure: Insertion Of C3F8 Gas;  Surgeon: Rennis Chris, MD;  Location: [facility] OR;  Service: Ophthalmology;  Laterality: Right;   GASTRIC EMPTYING SCAN  04/20/2017   Mildly abnormal, particularly the 1st hour of emptying.   MEMBRANE PEEL Right 10/31/2018   Procedure: MEMBRANE PEEL;  Surgeon: Rennis Chris, MD;  Location: [facility] OR;  Service: Ophthalmology;  Laterality: Right;   PARS PLANA VITRECTOMY Right 04/12/2018   Procedure: Right PARS PLANA VITRECTOMY WITH 25 GAUGE  with intravitreal antibiotics;  Surgeon: Rennis Chris, MD;  Location: [facility] OR;  Service: Ophthalmology;  Laterality: Right;   PARS PLANA VITRECTOMY Right 10/31/2018   Procedure: PARS PLANA VITRECTOMY WITH 25 GAUGE;  Surgeon: Rennis Chris, MD;  Location: [facility] OR;  Service: Ophthalmology;  Laterality: Right;   PHOTOCOAGULATION WITH LASER Right 10/31/2018   Procedure: Photocoagulation With Laser;  Surgeon: Rennis Chris, MD;  Location[facility] OR;  Service: Ophthalmology;  Laterality: Right;   TRANSTHORACIC ECHOCARDIOGRAM  08/23/2020   Grd I DD, o/w normal.   FAMILY HISTORY Family History  Problem Relation Age of Onset   Brain cancer Mother    Diabetes Father    Diabetes Maternal Grandmother    Cataracts Maternal Grandmother    Cervical cancer Paternal Grandmother    Colon cancer Maternal Grandfather 84   Amblyopia Neg Hx    Blindness Neg Hx    Glaucoma Neg Hx    Macular degeneration Neg Hx    Retinal detachment Neg Hx    Strabismus Neg Hx    Retinitis pigmentosa Neg Hx    Esophageal cancer Neg Hx    Stomach cancer Neg Hx    Rectal  cancer Neg Hx    SOCIAL HISTORY Social History   Tobacco Use   Smoking status: Never   Smokeless tobacco: Never  Vaping Use   Vaping status: Never Used  Substance Use Topics   Alcohol use: No   Drug use: No       OPHTHALMIC EXAM:  Base Eye Exam     Visual Acuity (Snellen - Linear)       Right Left   Dist cc 20/20 -1 20/25 +2    Correction: Glasses         Tonometry (Tonopen, 2:13 PM)       Right Left   Pressure 16 13         Pupils       Dark Light Shape React APD   Right 3 2 Round Brisk None   Left 3 2 Round Brisk None         Visual Fields (Counting fingers)       Left Right    Full Full         Extraocular Movement       Right Left    Full, Ortho Full, Ortho         Neuro/Psych     Oriented x3: Yes   Mood/Affect: Normal         Dilation     Both eyes: 1.0% Mydriacyl, 2.5% Phenylephrine @ 2:13 PM           Slit Lamp and Fundus Exam     Slit Lamp Exam       Right Left   Lids/Lashes Dermatochalasis - upper lid, mild Meibomian gland dysfunction, Telangiectasia Dermatochalasis - upper lid, Meibomian gland dysfunction, Telangiectasia   Conjunctiva/Sclera White and quiet White and quiet   Cornea Clear, well healed temporal cataract wounds Trace Punctate epithelial erosions, trace EBMD   Anterior Chamber Deep and quiet Deep and quiet   Iris Round and dilated, No NVI Round and dilated, No NVI   Lens PC IOL in good position, 1-2+ Posterior capsular opacification, PC folds 2-3+ Nuclear sclerosis with mild brunescence, 2-3+ Cortical cataract, 1+ Posterior subcapsular cataract   Anterior Vitreous post vitrectomy, trace pigment Vitreous syneresis, vitreous condensations, no frank red heme, old white VH clearing and settled inferiorly -- improved  Fundus Exam       Right Left   Disc mild Pallor, Sharp rim, temporal PPA Pink and sharp, Compact, PPA   C/D Ratio 0.2 0.1   Macula Flat, good foveal reflex, mild cystic changes  temporal macula -- persistent, focal MA and exudates ST mac -- improved, +focal laser changes Flat, good foveal reflex, trace cystic changes -- stably improved, scattered MA; trace ERM, light focal laser changes   Vessels attenuated, Tortuous attenuated, Tortuous   Periphery Attached, rare MA, scattered DBH greatest posteriorly, 360 PRP, good laser fill in 360 attached, scattered IRH; 360 PRP scars -- with good fill in changes           Refraction     Wearing Rx       Sphere Cylinder   Right -0.25 Sphere   Left -4.50 Sphere           IMAGING AND PROCEDURES  Imaging and Procedures for   OCT, Retina - OU - Both Eyes       Right Eye Quality was good. Central Foveal Thickness: 304. Progression has been stable. Findings include no SRF, abnormal foveal contour, intraretinal hyper-reflective material, epiretinal membrane, intraretinal fluid (persistent IRF/cystic changes temporal macula and fovea).   Left Eye Quality was good. Central Foveal Thickness: 274. Progression has been stable. Findings include normal foveal contour, no IRF, no SRF, intraretinal hyper-reflective material (stable improvement in IRF/cystic changes temporal fovea, stable improvement in vitreous opacities).   Notes *Images captured and stored on drive  Diagnosis / Impression:  +DME OU OD: persistent IRF/cystic changes temporal macula and fovea OS: stable improvement in IRF/cystic changes temporal fovea, stable improvement in vitreous opacities  Clinical management:  See below  Abbreviations: NFP - Normal foveal profile. CME - cystoid macular edema. PED - pigment epithelial detachment. IRF - intraretinal fluid. SRF - subretinal fluid. EZ - ellipsoid zone. ERM - epiretinal membrane. ORA - outer retinal atrophy. ORT - outer retinal tubulation. SRHM - subretinal hyper-reflective material       Intravitreal Injection, Pharmacologic Agent - OD - Right Eye       Time Out 05/11/2023. 3:30 PM. Confirmed  correct patient, procedure, site, and patient consented.   Anesthesia Topical anesthesia was used. Anesthetic medications included Lidocaine 2%, Proparacaine 0.5%.   Procedure Preparation included 5% betadine to ocular surface, eyelid speculum.   Injection: 8 mg aflibercept 8 MG/0.07ML (Patient supplied)   Route: Intravitreal, Site: Right Eye   NDC: 16109-604-54, Lot: [id number], Expiration date: 05/28/2023, Waste: 0 mL   Post-op Post injection exam found visual acuity of at least counting fingers. The patient tolerated the procedure well. There were no complications. The patient received written and verbal post procedure care education.   Notes **SAMPLE MEDICATION ADMINISTERED**      Intravitreal Injection, Pharmacologic Agent - OS - Left Eye       Time Out 05/11/2023. 3:31 PM. Confirmed correct patient, procedure, site, and patient consented.   Anesthesia Topical anesthesia was used. Anesthetic medications included Lidocaine 2%, Proparacaine 0.5%.   Procedure Preparation included 5% betadine to ocular surface, eyelid speculum. A (32g) needle was used.   Injection: 2 mg aflibercept 2 MG/0.05ML (Patient supplied)   Route: Intravitreal, Site: Left Eye   NDC: L6038910, Lot: [id number], Expiration date: 06/27/2023, Waste: 0 mL   Post-op Post injection exam found visual acuity of at least counting fingers. The patient tolerated the procedure well. There were no complications. The patient received written and verbal post procedure care education.  Notes **SAMPLE MEDICATION ADMINISTERED**            ASSESSMENT/PLAN:   ICD-10-CM   1. Proliferative diabetic retinopathy of both eyes with macular edema associated with type 2 diabetes mellitus (HCC)  E11.3513 OCT, Retina - OU - Both Eyes    Intravitreal Injection, Pharmacologic Agent - OD - Right Eye    Intravitreal Injection, Pharmacologic Agent - OS - Left Eye    aflibercept (EYLEA) SOLN 2 mg    aflibercept (EYLEA HD)  ophthalmic injection 8 mg    2. Current use of insulin (HCC)  Z79.4     3. Long term (current) use of oral hypoglycemic drugs  Z79.84     4. Long-term (current) use of injectable non-insulin antidiabetic drugs  Z79.85     5. Vitreous hemorrhage of left eye (HCC)  H43.12     6. Vitreous hemorrhage, right eye (HCC)  H43.11     7. Hypertensive retinopathy of both eyes  H35.033     8. Essential hypertension  I10     9. Right endophthalmia  H44.001     10. Combined forms of age-related cataract of left eye  H25.812     11. Pseudophakia of right eye  Z96.1      1-4.  Proliferative diabetic retinopathy w/ DME, OU - HbA1c 7.9 (10.08.24),8.6 (06.11.24), 7.1 (3.23.23), 8.6 (9.14.22), 7.8 (06.13.22) - s/p IVA OD #1 9.20.19, #2 (10.25.19), #3 (11.15.19), #4 (12.17.19), #5 (01.14.20), #6 (2.11.20), #7 (05.29.20), #8 (08.19.20), #9 (10.30.20), #10 (12.09.20), #11 (01.11.21), #12 (02.15.21), #13 (03.23.21), #14 (04.20.21), #15 (06.08.21), #16 (07.06.21) -- IVA resistance - s/p IVA OS #1 9.27.19, #2 (10.25.19), #3 (11.15.19), #4 (12.17.19), #5 (01.14.20), #6 (2.11.20), #7 (04.26.20), #8 (05.29.20), #9 (06.26.20), #10 (08.05.20), #11 (11.11.20), #12 (12.09.20), #13 (01.11.21), #14 (02.15.21), #15 (03.23.21), #16 (04.20.21), #17 (06.08.21) -- IVA resistance, #18 (09.27.21) ============================================================ - s/p IVE OD #1 (08.03.21) -- sample, #2 (09.10.21), #3 (10.08.21), #4 (11.23.21), #5 (12.21.21), #6 (01.19.22) sample, #7 (2.16.22), #8 (3.22.22), #9 (04.19.22), #10 (05.27.22), #11 (06.29.22), #12 (08.03.22), #13 (09.07.22), #14 (10.10.22), #15 (11.14.22), #16 (12.12.22), #17 (01.10.23) - sample, #18 (02.07.23), #19 (03.21.23), #20 (04.18.23), #21 (05.16.23) -- IVE resistance ============================================================= - s/p IVE OS #1 (10.25.21), #2 (11.23.21), #3 (12.21.21), #4 (3.22.22), #5 (06.29.22), #6 (08.03.22), #7 (09.07.22), #8 (10.10.22), #9  (11.14.22), #10 (02.07.23), #11 (05.16.23), #12 (08.15.23), #13 (11.14.23), #14 (02.06.24), #15 (05.06.24), #16 SAMPLE (07.01.24), #17 (09.26.24), #18 (12.20.24) ============================================================ - IVV OD #1 (06.16.23 -- sample), #2 (07.17.23), #3 (08.15.23), #4 (09.12.23), #5 (10.10.23), #6 (11.14.23), #7 (12.12.23), #8 (01.09.23), #9 (02.06.24), #10 (03.11.24), #11 (04.08.24), #12 (05.06.24), #13 (06.03.24), #14 (07.01.24), #15 (07.29.24), #16 (08.26.24)- IVV resistance ============================================================== - s/p IVE HD OD #1 (sample 09.26.24), #2 (10.25.24), #3 (11.21.24), #4 (12.20.24), #5 (01.17.25 -- sample) #6 (02.14.25 sample)  - S/P PRP OS (09.20.19), (5.19.20), (08.19.20), (04.28.21), (02.02.22)  - S/P PRP OD (9.27.19 and 11.21.19), fill-in (04.14.20) (09.03.20, surgery)  - S/P focal laser OS (07.06.21), OD (09.20.22)  - FA (9.20.19) shows +NVE OU and leaking MA and capillary nonperfusion  - repeat FA 11.15.19 shows NV regressing OU - pre-op: OD w/ VA down at 20/25, but there is some preretinal fibrosis / tractional membranes just superior to disc and mild central DME  - s/p 25g PPV+MP+10% C3F8 gas OD (09.03.20) -- ERM/PRF removal OD  - BCVA: OD 20/20 (stable) OS 20/25 (slightly decreased from 20/20)             - fibrosis/ERM stably improved; retina attached -  OCT shows OD: mild interval improvement in IRF/cystic changes temporal macula and fovea; OS: stable improvement in IRF/cystic changes temporal fovea, interval improvement in vitreous opacities at 4 wks - recommend IVE HD OD #7 (SAMPLE) and IVE OS #19 today, 03.14.25 with follow up in 4 weeks - [employer] funding unavailable **OS on ~q88m maintenance treatment schedule -- due again ~June**  - RBA of procedure discussed, questions answered - see procedure note  - Eylea informed consent form re-signed and scanned on 05.16.2023  - Vabysmo informed consent form signed on 06.16.23  -  Eylea HD informed consent obtained, resigned, and scanned on 10.25.24  - Eylea HD approved for 2024 -- but no funding for [employer]  - f/u 4 weeks, DFE, OCT, possible injection(s)  5. Vitreous hemorrhage OS -- stably improved  - recurrent VH, onset 09.22.21  - etiology: secondary to PDR as described above (no RT/RD on exam)  - s/p PRP OS (9.20.19), (05.19.20), (08.19.20), (04.28.21), (02.02.22) - s/p IVA OS on 4.26.20, 5.29.20, 6.26.20, 8.5.20,11.11.20, 12.06.20 and so on as above   - Vit condensations stably improved  - BCVA stable at 20/25  - IVEs as above  - f/u 4 weeks DFE, OCT  6. History of Endophthalmitis OD  - s/p IVA OU 04/09/2018  - s/p 25g PPV w/ intravitreal vanc, ceftaz and cefepime OD, 2.14.2020  - s/p intravitreal tap / vanc and ceflaz injections (02.16.20)             - gram stain (2.14.20) shows G+ cocci, WBCs mostly PMNs;   - repeat gram stain from t/i (2.16.20) -- no organisms, just WBCs             - cultures from vitreous grew rare Staph warneri; cultures from t/i -- no growth             - doing well, BCVA 20/25             - inflammation/posterior debris resolved  - monitor  7. History of Vitreous Hemorrhage OD -- cleared from PPV x2 for endophthalmitis and ERM/preretinal fibrosis   - secondary to PDR  8,9. Hypertensive retinopathy OU  - discussed importance of tight BP control.  - monitor  10. Combined form age related cataract OS - The symptoms of cataract, surgical options, and treatments and risks were discussed with patient.   - discussed diagnosis and progression  11. Pseudophakia OD  - s/p CE/IOL OD (Dr. Laruth Bouchard, 12.11.20)  - beautiful surgery, doing well  Ophthalmic Meds Ordered this visit:  Meds ordered this encounter  Medications   aflibercept (EYLEA) SOLN 2 mg   aflibercept (EYLEA HD) ophthalmic injection 8 mg     Return in about 4 weeks (around 06/08/2023) for PDR OU, Dilated Exam, OCT, Possible Injxn.  There are no Patient  Instructions on file for this visit.  This document serves as a record of services personally performed by Karie Chimera, MD, PhD. It was created on their behalf by Berlin Hun COT, an ophthalmic technician. The creation of this record is the provider's dictation and/or activities during the visit.    Electronically signed by: Berlin Hun COT 03.05.25 5:01 PM  This document serves as a record of services personally performed by Karie Chimera, MD, PhD. It was created on their behalf by Annalee Genta, COMT. The creation of this record is the provider's dictation and/or activities during the visit.  Electronically signed by: Annalee Genta, COMT 05/11/23 5:01 PM  Isaias Cowman.  Vanessa Barbara, M.D., Ph.D. Diseases & Surgery of the Retina and Vitreous [facility] 05/11/2023   I have reviewed the above documentation for accuracy and completeness, and I agree with the above. Karie Chimera, M.D., Ph.D. 05/11/23 5:01 PM    Abbreviations: M myopia (nearsighted); A astigmatism; H hyperopia (farsighted); P presbyopia; Mrx spectacle prescription;  CTL contact lenses; OD right eye; OS left eye; OU both eyes  XT exotropia; ET esotropia; PEK punctate epithelial keratitis; PEE punctate epithelial erosions; DES dry eye syndrome; MGD meibomian gland dysfunction; ATs artificial tears; PFAT's preservative free artificial tears; NSC nuclear sclerotic cataract; PSC posterior subcapsular cataract; ERM epi-retinal membrane; PVD posterior vitreous detachment; RD retinal detachment; DM diabetes mellitus; DR diabetic retinopathy; NPDR non-proliferative diabetic retinopathy; PDR proliferative diabetic retinopathy; CSME clinically significant macular edema; DME diabetic macular edema; dbh dot blot hemorrhages; CWS cotton wool spot; POAG primary open angle glaucoma; C/D cup-to-disc ratio; HVF humphrey visual field; GVF goldmann visual field; OCT optical coherence tomography; IOP intraocular  pressure; BRVO Branch retinal vein occlusion; CRVO central retinal vein occlusion; CRAO central retinal artery occlusion; BRAO branch retinal artery occlusion; RT retinal tear; SB scleral buckle; PPV pars plana vitrectomy; VH Vitreous hemorrhage; PRP panretinal laser photocoagulation; IVK intravitreal kenalog; VMT vitreomacular traction; MH Macular hole;  NVD neovascularization of the disc; NVE neovascularization elsewhere; AREDS age related eye disease study; ARMD age related macular degeneration; POAG primary open angle glaucoma; EBMD epithelial/anterior basement membrane dystrophy; ACIOL anterior chamber intraocular lens; IOL intraocular lens; PCIOL posterior chamber intraocular lens; Phaco/IOL phacoemulsification with intraocular lens placement; PRK photorefractive keratectomy; LASIK laser assisted in situ keratomileusis; HTN hypertension; DM diabetes mellitus; COPD chronic obstructive pulmonary disease

## 2023-05-11 ENCOUNTER — Ambulatory Visit (INDEPENDENT_AMBULATORY_CARE_PROVIDER_SITE_OTHER): Payer: Medicare Other | Admitting: Ophthalmology

## 2023-05-11 ENCOUNTER — Encounter (INDEPENDENT_AMBULATORY_CARE_PROVIDER_SITE_OTHER): Payer: Self-pay | Admitting: Ophthalmology

## 2023-05-11 DIAGNOSIS — Z7984 Long term (current) use of oral hypoglycemic drugs: Secondary | ICD-10-CM

## 2023-05-11 DIAGNOSIS — H4313 Vitreous hemorrhage, bilateral: Secondary | ICD-10-CM

## 2023-05-11 DIAGNOSIS — Z7985 Long-term (current) use of injectable non-insulin antidiabetic drugs: Secondary | ICD-10-CM

## 2023-05-11 DIAGNOSIS — Z794 Long term (current) use of insulin: Secondary | ICD-10-CM | POA: Diagnosis not present

## 2023-05-11 DIAGNOSIS — H4312 Vitreous hemorrhage, left eye: Secondary | ICD-10-CM

## 2023-05-11 DIAGNOSIS — H4311 Vitreous hemorrhage, right eye: Secondary | ICD-10-CM

## 2023-05-11 DIAGNOSIS — E113513 Type 2 diabetes mellitus with proliferative diabetic retinopathy with macular edema, bilateral: Secondary | ICD-10-CM | POA: Diagnosis not present

## 2023-05-11 DIAGNOSIS — Z961 Presence of intraocular lens: Secondary | ICD-10-CM

## 2023-05-11 DIAGNOSIS — H35033 Hypertensive retinopathy, bilateral: Secondary | ICD-10-CM

## 2023-05-11 DIAGNOSIS — I10 Essential (primary) hypertension: Secondary | ICD-10-CM

## 2023-05-11 DIAGNOSIS — H25812 Combined forms of age-related cataract, left eye: Secondary | ICD-10-CM

## 2023-05-11 DIAGNOSIS — H44001 Unspecified purulent endophthalmitis, right eye: Secondary | ICD-10-CM

## 2023-05-11 MED ORDER — AFLIBERCEPT 2MG/0.05ML IZ SOLN FOR KALEIDOSCOPE
2.0000 mg | INTRAVITREAL | Status: AC | PRN
  Administered 2023-05-11: 2 mg via INTRAVITREAL

## 2023-05-11 MED ORDER — AFLIBERCEPT 8 MG/0.07ML IZ SOLN
8.0000 mg | INTRAVITREAL | Status: AC | PRN
Start: 2023-05-11 — End: 2023-05-11
  Administered 2023-05-11: 8 mg via INTRAVITREAL

## 2023-06-06 ENCOUNTER — Other Ambulatory Visit: Payer: Self-pay | Admitting: Family Medicine

## 2023-06-07 NOTE — Progress Notes (Signed)
 [facility] - Clinic Note  06/08/2023     CHIEF COMPLAINT Patient presents for Retina Follow Up  HISTORY OF PRESENT ILLNESS: Gloria Gomez is a 53 y.o. female who presents to the clinic today for:  HPI     Retina Follow Up   Patient presents with  Diabetic Retinopathy.  In both eyes.  This started 4 weeks ago.  Duration of 4 weeks.  Since onset it is stable.  I, the attending physician,  performed the HPI with the patient and updated documentation appropriately.        Comments   4 week retina follow up and I'VE HD OD pt is reporting vision is not as sharp this week she has some floaters denies any flashes       Last edited by Rennis Chris, MD on 06/08/2023  3:10 PM.    Patient thinks her vision is pretty good, she has noticed a little bit less clarity in the left eye  Referring physician:   HISTORICAL INFORMATION:   Selected notes from the MEDICAL RECORD NUMBER Referred for DM exam   CURRENT MEDICATIONS: No current outpatient medications on file. (Ophthalmic Drugs)   No current facility-administered medications for this visit. (Ophthalmic Drugs)   Current Outpatient Medications (Other)  Medication Sig   amLODipine (NORVASC) 5 MG tablet Take 1 tablet (5 mg total) by mouth daily.   Continuous Blood Gluc Sensor (FREESTYLE LIBRE 3 SENSOR) MISC by Does not apply route every 14 (fourteen) days.   FARXIGA 10 MG TABS tablet Take 10 mg by mouth daily.   furosemide (LASIX) 20 MG tablet TAKE 2 TABLETS BY MOUTH IN THE MORNING AND 1 IN THE EVENING   gabapentin (NEURONTIN) 300 MG capsule Take 1 capsule (300 mg total) by mouth 3 (three) times daily.   HUMALOG KWIKPEN 100 UNIT/ML KwikPen Inject into the skin.   HYDROcodone-acetaminophen (NORCO/VICODIN) 5-325 MG tablet Take 1-2 tablets by mouth every 6 (six) hours as needed for moderate pain.   insulin aspart (NOVOLOG) 100 UNIT/ML injection Inject 10 Units into the skin 2 (two) times daily with a meal. Per  endocrinologist   Insulin Glargine (BASAGLAR KWIKPEN) 100 UNIT/ML 10 units every morning and 24 units nightly   losartan (COZAAR) 100 MG tablet Take 1 tablet by mouth once daily   meloxicam (MOBIC) 15 MG tablet Take 1 tablet daily as needed.   metFORMIN (GLUCOPHAGE) 1000 MG tablet TAKE 1 TABLET BY MOUTH TWICE DAILY WITH MEALS   metoCLOPramide (REGLAN) 5 MG tablet TAKE 1 TABLET BY MOUTH 4 TIMES DAILY BEFORE MEAL(S) AND AT BEDTIME   metoprolol succinate (TOPROL-XL) 100 MG 24 hr tablet Take 2 tablets by mouth once daily   Multiple Vitamin (MULTIVITAMIN WITH MINERALS) TABS tablet Take 1 tablet by mouth daily.   NOVOLOG FLEXPEN 100 UNIT/ML FlexPen    pantoprazole (PROTONIX) 40 MG tablet Take 1 tablet by mouth once daily   promethazine (PHENERGAN) 12.5 MG tablet TAKE 1 TO 2 TABLETS BY MOUTH EVERY 6 HOURS AS NEEDED FOR NAUSEA   rosuvastatin (CRESTOR) 20 MG tablet Take 1 tablet (20 mg total) by mouth daily.   TRULICITY 1.5 MG/0.5ML SOPN Inject into the skin.   verapamil (CALAN-SR) 240 MG CR tablet Take by mouth.   verapamil (CALAN-SR) 240 MG CR tablet TAKE 1 TABLET BY MOUTH ONCE DAILY AT BEDTIME   VICTOZA 18 MG/3ML SOPN INJECT 1.2MG  INTO THE SKIN DAILY   No current facility-administered medications for this visit. (Other)   REVIEW  OF SYSTEMS: ROS   Positive for: Endocrine, Eyes Negative for: Constitutional, Gastrointestinal, Neurological, Skin, Genitourinary, Musculoskeletal, HENT, Cardiovascular, Respiratory, Psychiatric, Allergic/Imm, Heme/Lymph Last edited by Etheleen Mayhew, COT on 06/08/2023  1:45 PM.     ALLERGIES Allergies  Allergen Reactions   Gluten Meal Swelling   Lisinopril Cough   Pioglitazone Other (See Comments)    ELEVATED glucoses + worse chronic nausea   PAST MEDICAL HISTORY Past Medical History:  Diagnosis Date   Abdominal bloating    likely from diab gastroparesis.  Dr. Adela Lank started trial of reglan 03/2019.   Benign brain tumor (HCC)    Cystic lesion in  cerebral aqueduct region with mild hydrocephalus-- stable MRI 02/2016.  Surveillance MRI 05/2017 --dilated cerebral aqueduct related to aqueductal stenosis and subsequent mild hydrocephalus (due to the 11 mm stable cystic lesion in cerebral aqueduct---?congenitial?.   Cataract    OU   Dysmenorrhea    vicodin occ during first 2 days of cycle.   Fibromyalgia    Gluten intolerance    pt reports she underwent full GI w/u to r/o celiac dz   Hepatic steatosis    ultrasound 08/2017. Hx of very mild elevation of ALT.  Stable on u/s 06/2020   History of adenomatous polyp of colon 04/08/2019   recall Feb 2024   Hyperlipidemia, mixed    Hypertension    +white coat component   Hypertensive retinopathy of both eyes    Insomnia    Iron deficiency 01/2019   Hb 11.3. Hemoccults neg x 3 03/19/19. EGD and colonoscopy 04/08/19 showed NO cause for IDA.  Pt does have menorrhagia, though, so she'll see her GYN.  started FeSO4 325 qd approx 04/14/19.   Menorrhagia    resulting in IDA 2021   PMR (polymyalgia rheumatica) (HCC) 2022   question of; hx of elevated ESR-->better with prednisone but prednisone was contraindicated d/t eye issues/hyperglycemia-->rheum started following her 02/2021->plaquenil 04/2021   Proliferative diabetic retinopathy of both eyes (HCC)    steroid injections 10/2017--improved   Sensorineural hearing loss of left ear    Sudden left hearing loss summer 2016--no improvement with steroids 01/2015 so brain MRI done by Dr. Jenne Pane and it showed brain tumor that was determined to be benign.  Pt's hearing not bad enough for hearing aid as of 06/2016.   Type 2 diabetes with complication (HCC)    +microalbuminuria, diab retpthy, diabetic gastroparesis (gastric emptying study mildly abnl 03/2017).  Recommended lantus 08/2018 but pt declined. Mild microalbuminuria.   Uterine fibroid 2022   per pt report, pelvic u/s in GYN office   Past Surgical History:  Procedure Laterality Date   ANOSCOPY  05/12/2019    Procedure: normal exam, minimal hemorrhoid disease. Hyertrophied anal papila, benign appearing, posterior midline. Surgeon: Romie Levee MD   CHOLECYSTECTOMY  2000   COLONOSCOPY  04/08/2019   5 adenomas, recall 3 yrs; no cause for IDA found.  Hypertrophied anal papillae->bx showed low grade dysplasia; GI referred her to colorectal surgeon.   ESOPHAGOGASTRODUODENOSCOPY  04/08/2019   mild chronic reactive gastritis. H pylori NEG.  No cause for IDA found.   GAS INSERTION Right 10/31/2018   Procedure: Insertion Of C3F8 Gas;  Surgeon: Rennis Chris, MD;  Location: [facility] OR;  Service: Ophthalmology;  Laterality: Right;   GASTRIC EMPTYING SCAN  04/20/2017   Mildly abnormal, particularly the 1st hour of emptying.   MEMBRANE PEEL Right 10/31/2018   Procedure: MEMBRANE PEEL;  Surgeon: Rennis Chris, MD;  Location: [facility] OR;  Service: Ophthalmology;  Laterality: Right;   PARS PLANA VITRECTOMY Right 04/12/2018   Procedure: Right PARS PLANA VITRECTOMY WITH 25 GAUGE with intravitreal antibiotics;  Surgeon: Rennis Chris, MD;  Location: [facility] OR;  Service: Ophthalmology;  Laterality: Right;   PARS PLANA VITRECTOMY Right 10/31/2018   Procedure: PARS PLANA VITRECTOMY WITH 25 GAUGE;  Surgeon: Rennis Chris, MD;  Location: [facility] OR;  Service: Ophthalmology;  Laterality: Right;   PHOTOCOAGULATION WITH LASER Right 10/31/2018   Procedure: Photocoagulation With Laser;  Surgeon: Rennis Chris, MD;  Location: [facility] OR;  Service: Ophthalmology;  Laterality: Right;   TRANSTHORACIC ECHOCARDIOGRAM  08/23/2020   Grd I DD, o/w normal.   FAMILY HISTORY Family History  Problem Relation Age of Onset   Brain cancer Mother    Diabetes Father    Diabetes Maternal Grandmother    Cataracts Maternal Grandmother    Cervical cancer Paternal Grandmother    Colon cancer Maternal Grandfather 106   Amblyopia Neg Hx    Blindness Neg Hx    Glaucoma Neg Hx    Macular degeneration Neg Hx    Retinal detachment Neg Hx    Strabismus Neg Hx     Retinitis pigmentosa Neg Hx    Esophageal cancer Neg Hx    Stomach cancer Neg Hx    Rectal cancer Neg Hx    SOCIAL HISTORY Social History   Tobacco Use   Smoking status: Never   Smokeless tobacco: Never  Vaping Use   Vaping status: Never Used  Substance Use Topics   Alcohol use: No   Drug use: No       OPHTHALMIC EXAM:  Base Eye Exam     Visual Acuity (Snellen - Linear)       Right Left   Dist cc 20/25 20/25 -2   Dist ph cc NI NI    Correction: Glasses         Tonometry (Tonopen, 1:50 PM)       Right Left   Pressure 13 15         Pupils       Pupils Dark Light Shape React APD   Right PERRL 3 2 Round Brisk None   Left PERRL 3 2 Round Brisk None         Visual Fields       Left Right    Full Full         Extraocular Movement       Right Left    Full, Ortho Full, Ortho         Neuro/Psych     Oriented x3: Yes   Mood/Affect: Normal         Dilation     Both eyes: 2.5% Phenylephrine @ 1:50 PM           Slit Lamp and Fundus Exam     Slit Lamp Exam       Right Left   Lids/Lashes Dermatochalasis - upper lid, mild Meibomian gland dysfunction, Telangiectasia Dermatochalasis - upper lid, Meibomian gland dysfunction, Telangiectasia   Conjunctiva/Sclera White and quiet White and quiet   Cornea Clear, well healed temporal cataract wounds Trace Punctate epithelial erosions, trace EBMD   Anterior Chamber Deep and quiet Deep and quiet   Iris Round and dilated, No NVI Round and dilated, No NVI   Lens PC IOL in good position, 1-2+ Posterior capsular opacification, PC folds 2-3+ Nuclear sclerosis with mild brunescence, 2-3+ Cortical cataract, 1+ Posterior subcapsular cataract   Anterior Vitreous post vitrectomy, trace pigment  Vitreous syneresis, vitreous condensations, no frank red heme, old white VH clearing and settled inferiorly -- improved         Fundus Exam       Right Left   Disc mild Pallor, Sharp rim, temporal PPA Pink and  sharp, Compact, PPA   C/D Ratio 0.2 0.1   Macula Flat, good foveal reflex, mild cystic changes temporal macula -- improved, focal MA and exudates ST mac -- improved, +focal laser changes Flat, good foveal reflex, trace cystic changes -- stably improved, scattered MA; trace ERM, light focal laser changes   Vessels attenuated, Tortuous attenuated, Tortuous   Periphery Attached, rare MA, scattered DBH greatest posteriorly, 360 PRP, good laser fill in 360 attached, scattered IRH; 360 PRP scars -- with good fill in changes           Refraction     Wearing Rx       Sphere Cylinder   Right -0.25 Sphere   Left -4.50 Sphere           IMAGING AND PROCEDURES  Imaging and Procedures for   OCT, Retina - OU - Both Eyes       Right Eye Quality was good. Central Foveal Thickness: 303. Progression has improved. Findings include no SRF, abnormal foveal contour, intraretinal hyper-reflective material, epiretinal membrane, intraretinal fluid (Mild interval improvement in IRF/cystic changes temporal macula and fovea).   Left Eye Quality was good. Central Foveal Thickness: 274. Progression has been stable. Findings include normal foveal contour, no IRF, no SRF, intraretinal hyper-reflective material (stable improvement in IRF/cystic changes temporal fovea, stable improvement in vitreous opacities).   Notes *Images captured and stored on drive  Diagnosis / Impression:  +DME OU OD: Mild interval improvement in IRF/cystic changes temporal macula and fovea OS: stable improvement in IRF/cystic changes temporal fovea, stable improvement in vitreous opacities  Clinical management:  See below  Abbreviations: NFP - Normal foveal profile. CME - cystoid macular edema. PED - pigment epithelial detachment. IRF - intraretinal fluid. SRF - subretinal fluid. EZ - ellipsoid zone. ERM - epiretinal membrane. ORA - outer retinal atrophy. ORT - outer retinal tubulation. SRHM - subretinal hyper-reflective  material       Intravitreal Injection, Pharmacologic Agent - OD - Right Eye       Time Out 06/08/2023. 2:42 PM. Confirmed correct patient, procedure, site, and patient consented.   Anesthesia Topical anesthesia was used. Anesthetic medications included Lidocaine 2%, Proparacaine 0.5%.   Procedure Preparation included 5% betadine to ocular surface, eyelid speculum. A 30 gauge needle was used.   Injection: 8 mg aflibercept 8 MG/0.07ML (Patient supplied)   Route: Intravitreal, Site: Right Eye   NDC: [id number], Lot: [id number], Expiration date: 12/28/2023, Waste: 0 mL   Post-op Post injection exam found visual acuity of at least counting fingers. The patient tolerated the procedure well. There were no complications. The patient received written and verbal post procedure care education.   Notes **SAMPLE MEDICATION ADMINISTERED**            ASSESSMENT/PLAN:   ICD-10-CM   1. Proliferative diabetic retinopathy of both eyes with macular edema associated with type 2 diabetes mellitus (HCC)  E11.3513 OCT, Retina - OU - Both Eyes    Intravitreal Injection, Pharmacologic Agent - OD - Right Eye    aflibercept (EYLEA HD) ophthalmic injection 8 mg    2. Current use of insulin (HCC)  Z79.4     3. Long term (current) use of oral hypoglycemic drugs  Z79.84  4. Long-term (current) use of injectable non-insulin antidiabetic drugs  Z79.85     5. Vitreous hemorrhage of left eye (HCC)  H43.12     6. Vitreous hemorrhage, right eye (HCC)  H43.11     7. Hypertensive retinopathy of both eyes  H35.033     8. Essential hypertension  I10     9. Right endophthalmia  H44.001     10. Combined forms of age-related cataract of left eye  H25.812     11. Pseudophakia of right eye  Z96.1       1-4.  Proliferative diabetic retinopathy w/ DME, OU - HbA1c 7.9 (10.08.24),8.6 (06.11.24), 7.1 (3.23.23), 8.6 (9.14.22), 7.8 (06.13.22) - s/p IVA OD #1 9.20.19, #2 (10.25.19), #3 (11.15.19), #4  (12.17.19), #5 (01.14.20), #6 (2.11.20), #7 (05.29.20), #8 (08.19.20), #9 (10.30.20), #10 (12.09.20), #11 (01.11.21), #12 (02.15.21), #13 (03.23.21), #14 (04.20.21), #15 (06.08.21), #16 (07.06.21) -- IVA resistance - s/p IVA OS #1 9.27.19, #2 (10.25.19), #3 (11.15.19), #4 (12.17.19), #5 (01.14.20), #6 (2.11.20), #7 (04.26.20), #8 (05.29.20), #9 (06.26.20), #10 (08.05.20), #11 (11.11.20), #12 (12.09.20), #13 (01.11.21), #14 (02.15.21), #15 (03.23.21), #16 (04.20.21), #17 (06.08.21) -- IVA resistance, #18 (09.27.21) ============================================================ - s/p IVE OD #1 (08.03.21) -- sample, #2 (09.10.21), #3 (10.08.21), #4 (11.23.21), #5 (12.21.21), #6 (01.19.22) sample, #7 (2.16.22), #8 (3.22.22), #9 (04.19.22), #10 (05.27.22), #11 (06.29.22), #12 (08.03.22), #13 (09.07.22), #14 (10.10.22), #15 (11.14.22), #16 (12.12.22), #17 (01.10.23) - sample, #18 (02.07.23), #19 (03.21.23), #20 (04.18.23), #21 (05.16.23) -- IVE resistance ============================================================= - s/p IVE OS #1 (10.25.21), #2 (11.23.21), #3 (12.21.21), #4 (3.22.22), #5 (06.29.22), #6 (08.03.22), #7 (09.07.22), #8 (10.10.22), #9 (11.14.22), #10 (02.07.23), #11 (05.16.23), #12 (08.15.23), #13 (11.14.23), #14 (02.06.24), #15 (05.06.24), #16 SAMPLE (07.01.24), #17 (09.26.24), #18 (12.20.24) #19(03.14.25) ============================================================ - IVV OD #1 (06.16.23 -- sample), #2 (07.17.23), #3 (08.15.23), #4 (09.12.23), #5 (10.10.23), #6 (11.14.23), #7 (12.12.23), #8 (01.09.23), #9 (02.06.24), #10 (03.11.24), #11 (04.08.24), #12 (05.06.24), #13 (06.03.24), #14 (07.01.24), #15 (07.29.24), #16 (08.26.24)- IVV resistance ============================================================== - s/p IVE HD OD #1 (sample 09.26.24), #2 (10.25.24), #3 (11.21.24), #4 (12.20.24), #5 (01.17.25 -- sample), #6 (02.14.25 sample), #7 (03.14.25 -- sample)   - S/P PRP OS (09.20.19), (5.19.20),  (08.19.20), (04.28.21), (02.02.22)  - S/P PRP OD (9.27.19 and 11.21.19), fill-in (04.14.20) (09.03.20, surgery)  - S/P focal laser OS (07.06.21), OD (09.20.22)  - FA (9.20.19) shows +NVE OU and leaking MA and capillary nonperfusion  - repeat FA 11.15.19 shows NV regressing OU - pre-op: OD w/ VA down at 20/25, but there is some preretinal fibrosis / tractional membranes just superior to disc and mild central DME  - s/p 25g PPV+MP+10% C3F8 gas OD (09.03.20) -- ERM/PRF removal OD  - BCVA: OD 20/20 (stable) OS 20/25 (slightly decreased from 20/20)             - fibrosis/ERM stably improved; retina attached - OCT shows OD: mild interval improvement in IRF/cystic changes temporal macula and fovea; OS: stable improvement in IRF/cystic changes temporal fovea, interval improvement in vitreous opacities at 4 wks - recommend IVE HD OD #9 (SAMPLE) today, 03.14.25 with follow up extended to 6 weeks - [employer] funding unavailable **OS on ~q34m maintenance treatment schedule -- due again ~June**  - RBA of procedure discussed, questions answered - see procedure note  - Eylea informed consent form re-signed and scanned on 05.16.2023  - Vabysmo informed consent form signed on 06.16.23  Gretta Cool HD informed consent obtained, resigned, and scanned on 10.25.24  - Eylea HD approved for 2024 --  but no funding for [employer]  - f/u 6 weeks, DFE, OCT, possible injection(s)  5. Vitreous hemorrhage OS -- stably improved  - recurrent VH, onset 09.22.21  - etiology: secondary to PDR as described above (no RT/RD on exam)  - s/p PRP OS (9.20.19), (05.19.20), (08.19.20), (04.28.21), (02.02.22) - s/p IVA OS on 4.26.20, 5.29.20, 6.26.20, 8.5.20,11.11.20, 12.06.20 and so on as above   - Vit condensations stably improved  - BCVA stable at 20/25  - IVEs as above  - f/u 4 weeks DFE, OCT  6. History of Endophthalmitis OD  - s/p IVA OU 04/09/2018  - s/p 25g PPV w/ intravitreal vanc, ceftaz and cefepime OD, 2.14.2020  - s/p  intravitreal tap / vanc and ceflaz injections (02.16.20)             - gram stain (2.14.20) shows G+ cocci, WBCs mostly PMNs;   - repeat gram stain from t/i (2.16.20) -- no organisms, just WBCs             - cultures from vitreous grew rare Staph warneri; cultures from t/i -- no growth             - doing well, BCVA 20/25             - inflammation/posterior debris resolved  - monitor  7. History of Vitreous Hemorrhage OD -- cleared from PPV x2 for endophthalmitis and ERM/preretinal fibrosis   - secondary to PDR  8,9. Hypertensive retinopathy OU  - discussed importance of tight BP control.  - monitor  10. Combined form age related cataract OS - The symptoms of cataract, surgical options, and treatments and risks were discussed with patient.   - discussed diagnosis and progression  11. Pseudophakia OD  - s/p CE/IOL OD (Dr. Laruth Bouchard, 12.11.20)  - beautiful surgery, doing well  Ophthalmic Meds Ordered this visit:  Meds ordered this encounter  Medications   aflibercept (EYLEA HD) ophthalmic injection 8 mg     Return in about 6 weeks (around 07/20/2023) for f/u PDR OU, DFE, OCT, Possible Injxn.  There are no Patient Instructions on file for this visit.  This document serves as a record of services personally performed by Karie Chimera, MD, PhD. It was created on their behalf by Berlin Hun COT, an ophthalmic technician. The creation of this record is the provider's dictation and/or activities during the visit.    Electronically signed by: Berlin Hun COT 04.10.25 3:12 PM  This document serves as a record of services personally performed by Karie Chimera, MD, PhD. It was created on their behalf by Glee Arvin. Manson Passey, OA an ophthalmic technician. The creation of this record is the provider's dictation and/or activities during the visit.    Electronically signed by: Glee Arvin. Manson Passey, OA 06/08/23 3:12 PM  Karie Chimera, M.D., Ph.D. Diseases & Surgery of the Retina  and Vitreous [facility] 06/08/2023   I have reviewed the above documentation for accuracy and completeness, and I agree with the above. Karie Chimera, M.D., Ph.D. 06/08/23 3:13 PM   Abbreviations: M myopia (nearsighted); A astigmatism; H hyperopia (farsighted); P presbyopia; Mrx spectacle prescription;  CTL contact lenses; OD right eye; OS left eye; OU both eyes  XT exotropia; ET esotropia; PEK punctate epithelial keratitis; PEE punctate epithelial erosions; DES dry eye syndrome; MGD meibomian gland dysfunction; ATs artificial tears; PFAT's preservative free artificial tears; NSC nuclear sclerotic cataract; PSC posterior subcapsular cataract; ERM epi-retinal membrane; PVD posterior  vitreous detachment; RD retinal detachment; DM diabetes mellitus; DR diabetic retinopathy; NPDR non-proliferative diabetic retinopathy; PDR proliferative diabetic retinopathy; CSME clinically significant macular edema; DME diabetic macular edema; dbh dot blot hemorrhages; CWS cotton wool spot; POAG primary open angle glaucoma; C/D cup-to-disc ratio; HVF humphrey visual field; GVF goldmann visual field; OCT optical coherence tomography; IOP intraocular pressure; BRVO Branch retinal vein occlusion; CRVO central retinal vein occlusion; CRAO central retinal artery occlusion; BRAO branch retinal artery occlusion; RT retinal tear; SB scleral buckle; PPV pars plana vitrectomy; VH Vitreous hemorrhage; PRP panretinal laser photocoagulation; IVK intravitreal kenalog; VMT vitreomacular traction; MH Macular hole;  NVD neovascularization of the disc; NVE neovascularization elsewhere; AREDS age related eye disease study; ARMD age related macular degeneration; POAG primary open angle glaucoma; EBMD epithelial/anterior basement membrane dystrophy; ACIOL anterior chamber intraocular lens; IOL intraocular lens; PCIOL posterior chamber intraocular lens; Phaco/IOL phacoemulsification with intraocular lens placement; PRK  photorefractive keratectomy; LASIK laser assisted in situ keratomileusis; HTN hypertension; DM diabetes mellitus; COPD chronic obstructive pulmonary disease

## 2023-06-08 ENCOUNTER — Ambulatory Visit (INDEPENDENT_AMBULATORY_CARE_PROVIDER_SITE_OTHER): Admitting: Ophthalmology

## 2023-06-08 ENCOUNTER — Encounter (INDEPENDENT_AMBULATORY_CARE_PROVIDER_SITE_OTHER): Payer: Self-pay | Admitting: Ophthalmology

## 2023-06-08 DIAGNOSIS — Z794 Long term (current) use of insulin: Secondary | ICD-10-CM

## 2023-06-08 DIAGNOSIS — Z7984 Long term (current) use of oral hypoglycemic drugs: Secondary | ICD-10-CM | POA: Diagnosis not present

## 2023-06-08 DIAGNOSIS — Z961 Presence of intraocular lens: Secondary | ICD-10-CM

## 2023-06-08 DIAGNOSIS — E113513 Type 2 diabetes mellitus with proliferative diabetic retinopathy with macular edema, bilateral: Secondary | ICD-10-CM

## 2023-06-08 DIAGNOSIS — H44001 Unspecified purulent endophthalmitis, right eye: Secondary | ICD-10-CM

## 2023-06-08 DIAGNOSIS — H4312 Vitreous hemorrhage, left eye: Secondary | ICD-10-CM

## 2023-06-08 DIAGNOSIS — H4313 Vitreous hemorrhage, bilateral: Secondary | ICD-10-CM

## 2023-06-08 DIAGNOSIS — Z7985 Long-term (current) use of injectable non-insulin antidiabetic drugs: Secondary | ICD-10-CM | POA: Diagnosis not present

## 2023-06-08 DIAGNOSIS — H35033 Hypertensive retinopathy, bilateral: Secondary | ICD-10-CM

## 2023-06-08 DIAGNOSIS — H4311 Vitreous hemorrhage, right eye: Secondary | ICD-10-CM

## 2023-06-08 DIAGNOSIS — I10 Essential (primary) hypertension: Secondary | ICD-10-CM

## 2023-06-08 DIAGNOSIS — H25812 Combined forms of age-related cataract, left eye: Secondary | ICD-10-CM

## 2023-06-08 MED ORDER — AFLIBERCEPT 8 MG/0.07ML IZ SOLN
8.0000 mg | INTRAVITREAL | Status: AC | PRN
  Administered 2023-06-08: 8 mg via INTRAVITREAL

## 2023-07-18 ENCOUNTER — Other Ambulatory Visit: Payer: Self-pay | Admitting: Family Medicine

## 2023-07-18 NOTE — Progress Notes (Signed)
 [facility] - Clinic Note  07/20/2023     CHIEF COMPLAINT Patient presents for Retina Follow Up  HISTORY OF PRESENT ILLNESS: Gloria Gomez is a 53 y.o. female who presents to the clinic today for:  HPI     Retina Follow Up   Patient presents with  Diabetic Retinopathy.  In both eyes.  This started 4 weeks ago.  Duration of 4 weeks.  Since onset it is stable.  I, the attending physician,  performed the HPI with the patient and updated documentation appropriately.        Comments   4 week retina follow up PDR OU and I'VE HD OD pt is reporting  vision changes little in  noticed she denies any flashes or floaters last reading 125      Last edited by Ronelle Coffee, MD on 07/20/2023  3:13 PM.    Patient feels the vision has decreased.  Referring physician:   HISTORICAL INFORMATION:   Selected notes from the MEDICAL RECORD NUMBER Referred for DM exam   CURRENT MEDICATIONS: No current outpatient medications on file. (Ophthalmic Drugs)   No current facility-administered medications for this visit. (Ophthalmic Drugs)   Current Outpatient Medications (Other)  Medication Sig   amLODipine  (NORVASC ) 5 MG tablet Take 1 tablet (5 mg total) by mouth daily.   Continuous Blood Gluc Sensor (FREESTYLE LIBRE 3 SENSOR) MISC by Does not apply route every 14 (fourteen) days.   FARXIGA 10 MG TABS tablet Take 10 mg by mouth daily.   furosemide  (LASIX ) 20 MG tablet TAKE 2 TABLETS BY MOUTH IN THE MORNING AND 1 IN THE EVENING   gabapentin  (NEURONTIN ) 300 MG capsule Take 1 capsule (300 mg total) by mouth 3 (three) times daily.   HUMALOG KWIKPEN 100 UNIT/ML KwikPen Inject into the skin.   HYDROcodone -acetaminophen  (NORCO/VICODIN) 5-325 MG tablet Take 1-2 tablets by mouth every 6 (six) hours as needed for moderate pain.   insulin aspart (NOVOLOG) 100 UNIT/ML injection Inject 10 Units into the skin 2 (two) times daily with a meal. Per endocrinologist   Insulin Glargine  (BASAGLAR  KWIKPEN) 100 UNIT/ML 10 units every morning and 24 units nightly   losartan  (COZAAR ) 100 MG tablet Take 1 tablet by mouth once daily   meloxicam  (MOBIC ) 15 MG tablet Take 1 tablet daily as needed.   metFORMIN  (GLUCOPHAGE ) 1000 MG tablet TAKE 1 TABLET BY MOUTH TWICE DAILY WITH MEALS   metoCLOPramide  (REGLAN ) 5 MG tablet TAKE 1 TABLET BY MOUTH 4 TIMES DAILY BEFORE MEAL(S) AND AT BEDTIME   metoprolol  succinate (TOPROL -XL) 100 MG 24 hr tablet Take 2 tablets by mouth once daily   Multiple Vitamin (MULTIVITAMIN WITH MINERALS) TABS tablet Take 1 tablet by mouth daily.   NOVOLOG FLEXPEN 100 UNIT/ML FlexPen    pantoprazole  (PROTONIX ) 40 MG tablet Take 1 tablet by mouth once daily   promethazine  (PHENERGAN ) 12.5 MG tablet TAKE 1 TO 2 TABLETS BY MOUTH EVERY 6 HOURS AS NEEDED FOR NAUSEA   rosuvastatin  (CRESTOR ) 20 MG tablet Take 1 tablet (20 mg total) by mouth daily.   TRULICITY 1.5 MG/0.5ML SOPN Inject into the skin.   verapamil  (CALAN -SR) 240 MG CR tablet Take by mouth.   verapamil  (CALAN -SR) 240 MG CR tablet TAKE 1 TABLET BY MOUTH ONCE DAILY AT BEDTIME   VICTOZA  18 MG/3ML SOPN INJECT 1.2MG  INTO THE SKIN DAILY   No current facility-administered medications for this visit. (Other)   REVIEW OF SYSTEMS:   ALLERGIES Allergies  Allergen Reactions  Gluten Meal Swelling   Lisinopril  Cough   Pioglitazone  Other (See Comments)    ELEVATED glucoses + worse chronic nausea   PAST MEDICAL HISTORY Past Medical History:  Diagnosis Date   Abdominal bloating    likely from diab gastroparesis.  Dr. General Kenner started trial of reglan  03/2019.   Benign brain tumor (HCC)    Cystic lesion in cerebral aqueduct region with mild hydrocephalus-- stable MRI 02/2016.  Surveillance MRI 05/2017 --dilated cerebral aqueduct related to aqueductal stenosis and subsequent mild hydrocephalus (due to the 11 mm stable cystic lesion in cerebral aqueduct---?congenitial?.   Cataract    OU   Dysmenorrhea    vicodin occ during first  2 days of cycle.   Fibromyalgia    Gluten intolerance    pt reports she underwent full GI w/u to r/o celiac dz   Hepatic steatosis    ultrasound 08/2017. Hx of very mild elevation of ALT.  Stable on u/s 06/2020   History of adenomatous polyp of colon 04/08/2019   recall Feb 2024   Hyperlipidemia, mixed    Hypertension    +white coat component   Hypertensive retinopathy of both eyes    Insomnia    Iron deficiency 01/2019   Hb 11.3. Hemoccults neg x 3 03/19/19. EGD and colonoscopy 04/08/19 showed NO cause for IDA.  Pt does have menorrhagia, though, so she'll see her GYN.  started FeSO4 325 qd approx 04/14/19.   Menorrhagia    resulting in IDA 2021   PMR (polymyalgia rheumatica) (HCC) 2022   question of; hx of elevated ESR-->better with prednisone  but prednisone  was contraindicated d/t eye issues/hyperglycemia-->rheum started following her 02/2021->plaquenil 04/2021   Proliferative diabetic retinopathy of both eyes (HCC)    steroid injections 10/2017--improved   Sensorineural hearing loss of left ear    Sudden left hearing loss summer 2016--no improvement with steroids 01/2015 so brain MRI done by Dr. Tellis Feathers and it showed brain tumor that was determined to be benign.  Pt's hearing not bad enough for hearing aid as of 06/2016.   Type 2 diabetes with complication (HCC)    +microalbuminuria, diab retpthy, diabetic gastroparesis (gastric emptying study mildly abnl 03/2017).  Recommended lantus  08/2018 but pt declined. Mild microalbuminuria.   Uterine fibroid 2022   per pt report, pelvic u/s in GYN office   Past Surgical History:  Procedure Laterality Date   ANOSCOPY  05/12/2019   Procedure: normal exam, minimal hemorrhoid disease. Hyertrophied anal papila, benign appearing, posterior midline. Surgeon: Joyce Nixon MD   CHOLECYSTECTOMY  2000   COLONOSCOPY  04/08/2019   5 adenomas, recall 3 yrs; no cause for IDA found.  Hypertrophied anal papillae->bx showed low grade dysplasia; GI referred her to  colorectal surgeon.   ESOPHAGOGASTRODUODENOSCOPY  04/08/2019   mild chronic reactive gastritis. H pylori NEG.  No cause for IDA found.   GAS INSERTION Right 10/31/2018   Procedure: Insertion Of C3F8 Gas;  Surgeon: Ronelle Coffee, MD;  Location: [facility] OR;  Service: Ophthalmology;  Laterality: Right;   GASTRIC EMPTYING SCAN  04/20/2017   Mildly abnormal, particularly the 1st hour of emptying.   MEMBRANE PEEL Right 10/31/2018   Procedure: MEMBRANE PEEL;  Surgeon: Ronelle Coffee, MD;  Location: [facility] OR;  Service: Ophthalmology;  Laterality: Right;   PARS PLANA VITRECTOMY Right 04/12/2018   Procedure: Right PARS PLANA VITRECTOMY WITH 25 GAUGE with intravitreal antibiotics;  Surgeon: Ronelle Coffee, MD;  Location: [facility] OR;  Service: Ophthalmology;  Laterality: Right;   PARS PLANA VITRECTOMY Right 10/31/2018  Procedure: PARS PLANA VITRECTOMY WITH 25 GAUGE;  Surgeon: Ronelle Coffee, MD;  Location[facility] OR;  Service: Ophthalmology;  Laterality: Right;   PHOTOCOAGULATION WITH LASER Right 10/31/2018   Procedure: Photocoagulation With Laser;  Surgeon: Ronelle Coffee, MD;  Location: [facility] OR;  Service: Ophthalmology;  Laterality: Right;   TRANSTHORACIC ECHOCARDIOGRAM  08/23/2020   Grd I DD, o/w normal.   FAMILY HISTORY Family History  Problem Relation Age of Onset   Brain cancer Mother    Diabetes Father    Diabetes Maternal Grandmother    Cataracts Maternal Grandmother    Cervical cancer Paternal Grandmother    Colon cancer Maternal Grandfather 52   Amblyopia Neg Hx    Blindness Neg Hx    Glaucoma Neg Hx    Macular degeneration Neg Hx    Retinal detachment Neg Hx    Strabismus Neg Hx    Retinitis pigmentosa Neg Hx    Esophageal cancer Neg Hx    Stomach cancer Neg Hx    Rectal cancer Neg Hx    SOCIAL HISTORY Social History   Tobacco Use   Smoking status: Never   Smokeless tobacco: Never  Vaping Use   Vaping status: Never Used  Substance Use Topics   Alcohol use: No   Drug use: No        OPHTHALMIC EXAM:  Base Eye Exam     Visual Acuity (Snellen - Linear)       Right Left   Dist cc 20/25 -3 20/30 -1   Dist ph cc NI NI         Tonometry (Applanation, 12:57 PM)       Right Left   Pressure 14 15         Pupils       Pupils Dark Light Shape React APD   Right PERRL 5 2 Round Brisk None   Left PERRL 5 2 Round Brisk None         Visual Fields       Left Right    Full Full         Extraocular Movement       Right Left    Full, Ortho Full, Ortho         Neuro/Psych     Oriented x3: Yes   Mood/Affect: Normal         Dilation     Both eyes: 2.5% Phenylephrine  @ 12:57 PM           Slit Lamp and Fundus Exam     Slit Lamp Exam       Right Left   Lids/Lashes Dermatochalasis - upper lid, mild Meibomian gland dysfunction, Telangiectasia Dermatochalasis - upper lid, Meibomian gland dysfunction, Telangiectasia   Conjunctiva/Sclera White and quiet White and quiet   Cornea Clear, well healed temporal cataract wounds Trace Punctate epithelial erosions, trace EBMD   Anterior Chamber Deep and quiet Deep and quiet   Iris Round and dilated, No NVI Round and dilated, No NVI   Lens PC IOL in good position, 1-2+ Posterior capsular opacification, PC folds 2-3+ Nuclear sclerosis with mild brunescence, 2-3+ Cortical cataract, 1+ Posterior subcapsular cataract   Anterior Vitreous post vitrectomy, trace pigment Vitreous syneresis, vitreous condensations, no frank red heme, old white VH clearing and settled inferiorly -- improved         Fundus Exam       Right Left   Disc mild Pallor, Sharp rim, temporal PPA Pink and sharp, Compact, PPA  C/D Ratio 0.2 0.1   Macula Flat, good foveal reflex, mild cystic changes central increased, focal MA and exudates ST mac -- improved, +focal laser changes Flat, good foveal reflex, trace cystic changes -- stably improved, scattered MA; trace ERM, light focal laser changes   Vessels attenuated, Tortuous  attenuated, Tortuous   Periphery Attached, rare MA, scattered DBH greatest posteriorly, 360 PRP, good laser fill in 360 attached, scattered IRH; 360 PRP scars -- with good fill in changes           Refraction     Wearing Rx       Sphere Cylinder   Right -0.25 Sphere   Left -4.50 Sphere           IMAGING AND PROCEDURES  Imaging and Procedures for   OCT, Retina - OU - Both Eyes        Right Eye Quality was good. Central Foveal Thickness: 364. Progression has worsened. Findings include no SRF, abnormal foveal contour, intraretinal hyper-reflective material, epiretinal membrane, intraretinal fluid (interval increase in IRF/cystic changes ).   Left Eye Quality was good. Central Foveal Thickness: 274. Progression has been stable. Findings include normal foveal contour, no IRF, no SRF, intraretinal hyper-reflective material (stable improvement in IRF/cystic changes temporal fovea, stable improvement in vitreous opacities).   Notes  *Images captured and stored on drive  Diagnosis / Impression:  +DME OU OD: interval increase in IRF/cystic changes  OS: stable improvement in IRF/cystic changes temporal fovea, stable improvement in vitreous opacities  Clinical management:  See below  Abbreviations: NFP - Normal foveal profile. CME - cystoid macular edema. PED - pigment epithelial detachment. IRF - intraretinal fluid. SRF - subretinal fluid. EZ - ellipsoid zone. ERM - epiretinal membrane. ORA - outer retinal atrophy. ORT - outer retinal tubulation. SRHM - subretinal hyper-reflective material       Intravitreal Injection, Pharmacologic Agent - OD - Right Eye        Time Out 07/20/2023. 1:30 PM. Confirmed correct patient, procedure, site, and patient consented.   Anesthesia Topical anesthesia was used. Anesthetic medications included Lidocaine  2%, Proparacaine  0.5%.   Procedure Preparation included 5% betadine to ocular surface, eyelid speculum. A (32g) needle was used.    Injection: 8 mg aflibercept  8 MG/0.07ML (Patient supplied)   Route: Intravitreal, Site: Right Eye   NDC: 16109-604-54, Lot: [id number], Expiration date: 01/27/2024, Waste: 0 mL   Post-op Post injection exam found visual acuity of at least counting fingers. The patient tolerated the procedure well. There were no complications. The patient received written and verbal post procedure care education.   Notes  **SAMPLE MEDICATION ADMINISTERED**             ASSESSMENT/PLAN:   ICD-10-CM   1. Proliferative diabetic retinopathy of both eyes with macular edema associated with type 2 diabetes mellitus (HCC)  E11.3513 OCT, Retina - OU - Both Eyes    Intravitreal Injection, Pharmacologic Agent - OD - Right Eye    aflibercept  (EYLEA  HD) ophthalmic injection 8 mg    2. Current use of insulin (HCC)  Z79.4     3. Long term (current) use of oral hypoglycemic drugs  Z79.84     4. Long-term (current) use of injectable non-insulin antidiabetic drugs  Z79.85     5. Vitreous hemorrhage of left eye (HCC)  H43.12     6. Vitreous hemorrhage, right eye (HCC)  H43.11     7. Hypertensive retinopathy of both eyes  H35.033     8. Essential  hypertension  I10     9. Right endophthalmia  H44.001     10. Combined forms of age-related cataract of left eye  H25.812     11. Pseudophakia of right eye  Z96.1       1-4.  Proliferative diabetic retinopathy w/ DME, OU - HbA1c 7.9 (10.08.24),8.6 (06.11.24), 7.1 (3.23.23), 8.6 (9.14.22), 7.8 (06.13.22) - s/p IVA OD #1 9.20.19, #2 (10.25.19), #3 (11.15.19), #4 (12.17.19), #5 (01.14.20), #6 (2.11.20), #7 (05.29.20), #8 (08.19.20), #9 (10.30.20), #10 (12.09.20), #11 (01.11.21), #12 (02.15.21), #13 (03.23.21), #14 (04.20.21), #15 (06.08.21), #16 (07.06.21) -- IVA resistance - s/p IVA OS #1 9.27.19, #2 (10.25.19), #3 (11.15.19), #4 (12.17.19), #5 (01.14.20), #6 (2.11.20), #7 (04.26.20), #8 (05.29.20), #9 (06.26.20), #10 (08.05.20), #11 (11.11.20), #12 (12.09.20),  #13 (01.11.21), #14 (02.15.21), #15 (03.23.21), #16 (04.20.21), #17 (06.08.21) -- IVA resistance, #18 (09.27.21) ===================== - s/p IVE OD #1 (08.03.21) -- sample, #2 (09.10.21), #3 (10.08.21), #4 (11.23.21), #5 (12.21.21), #6 (01.19.22) sample, #7 (2.16.22), #8 (3.22.22), #9 (04.19.22), #10 (05.27.22), #11 (06.29.22), #12 (08.03.22), #13 (09.07.22), #14 (10.10.22), #15 (11.14.22), #16 (12.12.22), #17 (01.10.23) - sample, #18 (02.07.23), #19 (03.21.23), #20 (04.18.23), #21 (05.16.23) -- IVE resistance ===================== - s/p IVE OS #1 (10.25.21), #2 (11.23.21), #3 (12.21.21), #4 (3.22.22), #5 (06.29.22), #6 (08.03.22), #7 (09.07.22), #8 (10.10.22), #9 (11.14.22), #10 (02.07.23), #11 (05.16.23), #12 (08.15.23), #13 (11.14.23), #14 (02.06.24), #15 (05.06.24), #16 SAMPLE (07.01.24), #17 (09.26.24), #18 (12.20.24) #19 (03.14.25) ===================== - IVV OD #1 (06.16.23 -- sample), #2 (07.17.23), #3 (08.15.23), #4 (09.12.23), #5 (10.10.23), #6 (11.14.23), #7 (12.12.23), #8 (01.09.23), #9 (02.06.24), #10 (03.11.24), #11 (04.08.24), #12 (05.06.24), #13 (06.03.24), #14 (07.01.24), #15 (07.29.24), #16 (08.26.24)- IVV resistance ====================== - s/p IVE HD OD #1 (sample 09.26.24), #2 (10.25.24), #3 (11.21.24), #4 (12.20.24), #5 (01.17.25 -- sample), #6 (02.14.25 sample), #7 (03.14.25 -- sample)  #9 (03.14.25) sample   - S/P PRP OS (09.20.19), (5.19.20), (08.19.20), (04.28.21), (02.02.22) - S/P PRP OD (9.27.19 and 11.21.19), fill-in (04.14.20) (09.03.20, surgery)  - S/P focal laser OS (07.06.21), OD (09.20.22) - FA (9.20.19) shows +NVE OU and leaking MA and capillary nonperfusion  - repeat FA 11.15.19 shows NV regressing OU - pre-op: OD w/ VA down at 20/25, but there is some preretinal fibrosis / tractional membranes just superior to disc and mild central DME - s/p 25g PPV+MP+10% C3F8 gas OD (09.03.20) -- ERM/PRF removal OD  - BCVA: OD 20/20 (stable) OS 20/25 (slightly decreased from  20/20)             - fibrosis/ERM stably improved; retina attached - OCT shows OD: OD: interval increase in IRF/cystic changes; OS: stable improvement in IRF/cystic changes temporal fovea, stable improvement in vitreous opacities at 6 wks - recommend IVE HD OD #10 (SAMPLE) today, 05.23.25 with follow up dec back to 4-5 weeks - [employer] funding unavailable **OS on ~q6m maintenance treatment schedule -- due again ~June**  - RBA of procedure discussed, questions answered - see procedure note  - Eylea  informed consent form re-signed and scanned on 05.16.2023  - Vabysmo  informed consent form signed on 06.16.23 - Eylea  HD informed consent obtained, resigned, and scanned on 10.25.24  - Eylea  HD approved for 2024 -- but no funding for [employer]  - f/u 4-5 weeks, DFE, OCT, possible injection(s)  5. Vitreous hemorrhage OS -- stably improved  - recurrent VH, onset 09.22.21  - etiology: secondary to PDR as described above (no RT/RD on exam)  - s/p PRP OS (9.20.19), (05.19.20), (08.19.20), (04.28.21), (02.02.22) - s/p IVA OS  on 4.26.20, 5.29.20, 6.26.20, 8.5.20,11.11.20, 12.06.20 and so on as above   - Vit condensations stably improved  - BCVA stable at 20/25  - IVEs as above  - f/u 4 weeks DFE, OCT  6. History of Endophthalmitis OD  - s/p IVA OU 04/09/2018  - s/p 25g PPV w/ intravitreal vanc, ceftaz and cefepime  OD, 2.14.2020  - s/p intravitreal tap / vanc and ceflaz injections (02.16.20)             - gram stain (2.14.20) shows G+ cocci, WBCs mostly PMNs;   - repeat gram stain from t/i (2.16.20) -- no organisms, just WBCs  - cultures from vitreous grew rare Staph warneri; cultures from t/i -- no growth             - doing well, BCVA 20/25             - inflammation/posterior debris resolved  - monitor  7. History of Vitreous Hemorrhage OD -- cleared from PPV x2 for endophthalmitis and ERM/preretinal fibrosis   - secondary to PDR  8,9. Hypertensive retinopathy OU  - discussed importance of  tight BP control.  - monitor  10. Combined form age related cataract OS - The symptoms of cataract, surgical options, and treatments and risks were discussed with patient.   - discussed diagnosis and progression  11. Pseudophakia OD  - s/p CE/IOL OD (Dr. Marvin Slot, 12.11.20)  - beautiful surgery, doing well  Ophthalmic Meds Ordered this visit:  Meds ordered this encounter  Medications   aflibercept  (EYLEA  HD) ophthalmic injection 8 mg     Return in about 4 weeks (around 08/17/2023) for f/u PDR OU, DFE, OCT, Possible, IVE HD, OD.  There are no Patient Instructions on file for this visit.  This document serves as a record of services personally performed by Jeanice Millard, MD, PhD. It was created on their behalf by Eller Gut COT, an ophthalmic technician. The creation of this record is the provider's dictation and/or activities during the visit.    Electronically signed by: Eller Gut COT 05.21.25 3:14 PM  This document serves as a record of services personally performed by Jeanice Millard, MD, PhD. It was created on their behalf by Olene Berne, COT an ophthalmic technician. The creation of this record is the provider's dictation and/or activities during the visit.    Electronically signed by:  Olene Berne, COT  07/20/23 3:14 PM  Jeanice Millard, M.D., Ph.D. Diseases & Surgery of the Retina and Vitreous [facility] 07/20/2023   I have reviewed the above documentation for accuracy and completeness, and I agree with the above. Jeanice Millard, M.D., Ph.D. 07/20/23 3:15 PM   Abbreviations: M myopia (nearsighted); A astigmatism; H hyperopia (farsighted); P presbyopia; Mrx spectacle prescription;  CTL contact lenses; OD right eye; OS left eye; OU both eyes  XT exotropia; ET esotropia; PEK punctate epithelial keratitis; PEE punctate epithelial erosions; DES dry eye syndrome; MGD meibomian gland dysfunction; ATs artificial tears;  PFAT's preservative free artificial tears; NSC nuclear sclerotic cataract; PSC posterior subcapsular cataract; ERM epi-retinal membrane; PVD posterior vitreous detachment; RD retinal detachment; DM diabetes mellitus; DR diabetic retinopathy; NPDR non-proliferative diabetic retinopathy; PDR proliferative diabetic retinopathy; CSME clinically significant macular edema; DME diabetic macular edema; dbh dot blot hemorrhages; CWS cotton wool spot; POAG primary open angle glaucoma; C/D cup-to-disc ratio; HVF humphrey visual field; GVF goldmann visual field; OCT optical coherence tomography; IOP intraocular pressure; BRVO Branch retinal vein  occlusion; CRVO central retinal vein occlusion; CRAO central retinal artery occlusion; BRAO branch retinal artery occlusion; RT retinal tear; SB scleral buckle; PPV pars plana vitrectomy; VH Vitreous hemorrhage; PRP panretinal laser photocoagulation; IVK intravitreal kenalog ; VMT vitreomacular traction; MH Macular hole;  NVD neovascularization of the disc; NVE neovascularization elsewhere; AREDS age related eye disease study; ARMD age related macular degeneration; POAG primary open angle glaucoma; EBMD epithelial/anterior basement membrane dystrophy; ACIOL anterior chamber intraocular lens; IOL intraocular lens; PCIOL posterior chamber intraocular lens; Phaco/IOL phacoemulsification with intraocular lens placement; PRK photorefractive keratectomy; LASIK laser assisted in situ keratomileusis; HTN hypertension; DM diabetes mellitus; COPD chronic obstructive pulmonary disease

## 2023-07-20 ENCOUNTER — Ambulatory Visit (INDEPENDENT_AMBULATORY_CARE_PROVIDER_SITE_OTHER): Admitting: Ophthalmology

## 2023-07-20 ENCOUNTER — Encounter (INDEPENDENT_AMBULATORY_CARE_PROVIDER_SITE_OTHER): Payer: Self-pay | Admitting: Ophthalmology

## 2023-07-20 DIAGNOSIS — I10 Essential (primary) hypertension: Secondary | ICD-10-CM

## 2023-07-20 DIAGNOSIS — E113513 Type 2 diabetes mellitus with proliferative diabetic retinopathy with macular edema, bilateral: Secondary | ICD-10-CM | POA: Diagnosis not present

## 2023-07-20 DIAGNOSIS — H44001 Unspecified purulent endophthalmitis, right eye: Secondary | ICD-10-CM

## 2023-07-20 DIAGNOSIS — Z7984 Long term (current) use of oral hypoglycemic drugs: Secondary | ICD-10-CM

## 2023-07-20 DIAGNOSIS — H4312 Vitreous hemorrhage, left eye: Secondary | ICD-10-CM

## 2023-07-20 DIAGNOSIS — H35033 Hypertensive retinopathy, bilateral: Secondary | ICD-10-CM

## 2023-07-20 DIAGNOSIS — Z794 Long term (current) use of insulin: Secondary | ICD-10-CM

## 2023-07-20 DIAGNOSIS — H25812 Combined forms of age-related cataract, left eye: Secondary | ICD-10-CM

## 2023-07-20 DIAGNOSIS — H4313 Vitreous hemorrhage, bilateral: Secondary | ICD-10-CM

## 2023-07-20 DIAGNOSIS — Z7985 Long-term (current) use of injectable non-insulin antidiabetic drugs: Secondary | ICD-10-CM | POA: Diagnosis not present

## 2023-07-20 DIAGNOSIS — H4311 Vitreous hemorrhage, right eye: Secondary | ICD-10-CM

## 2023-07-20 DIAGNOSIS — Z961 Presence of intraocular lens: Secondary | ICD-10-CM

## 2023-07-20 MED ORDER — AFLIBERCEPT 8 MG/0.07ML IZ SOLN
8.0000 mg | INTRAVITREAL | Status: AC | PRN
  Administered 2023-07-20: 8 mg via INTRAVITREAL

## 2023-07-29 DIAGNOSIS — S82409A Unspecified fracture of shaft of unspecified fibula, initial encounter for closed fracture: Secondary | ICD-10-CM

## 2023-07-29 HISTORY — DX: Unspecified fracture of shaft of unspecified fibula, initial encounter for closed fracture: S82.409A

## 2023-08-04 ENCOUNTER — Other Ambulatory Visit: Payer: Self-pay | Admitting: Family Medicine

## 2023-08-12 ENCOUNTER — Other Ambulatory Visit: Payer: Self-pay | Admitting: Family Medicine

## 2023-08-14 ENCOUNTER — Other Ambulatory Visit: Payer: Self-pay | Admitting: Family Medicine

## 2023-08-14 DIAGNOSIS — E1165 Type 2 diabetes mellitus with hyperglycemia: Secondary | ICD-10-CM | POA: Diagnosis not present

## 2023-08-17 NOTE — Patient Instructions (Signed)
   Please call [facility] office at [phone] to schedule your repeat colonoscopy at your earliest convenience.   It was very nice to see you today!

## 2023-08-20 ENCOUNTER — Encounter: Payer: Self-pay | Admitting: Family Medicine

## 2023-08-20 ENCOUNTER — Ambulatory Visit (INDEPENDENT_AMBULATORY_CARE_PROVIDER_SITE_OTHER): Payer: Medicare Other | Admitting: Family Medicine

## 2023-08-20 VITALS — BP 112/76 | HR 81 | Temp 98.3°F | Wt 169.4 lb

## 2023-08-20 DIAGNOSIS — M797 Fibromyalgia: Secondary | ICD-10-CM

## 2023-08-20 DIAGNOSIS — E782 Mixed hyperlipidemia: Secondary | ICD-10-CM

## 2023-08-20 DIAGNOSIS — S93491A Sprain of other ligament of right ankle, initial encounter: Secondary | ICD-10-CM

## 2023-08-20 DIAGNOSIS — E118 Type 2 diabetes mellitus with unspecified complications: Secondary | ICD-10-CM

## 2023-08-20 DIAGNOSIS — Z794 Long term (current) use of insulin: Secondary | ICD-10-CM

## 2023-08-20 DIAGNOSIS — I10 Essential (primary) hypertension: Secondary | ICD-10-CM

## 2023-08-20 DIAGNOSIS — G894 Chronic pain syndrome: Secondary | ICD-10-CM

## 2023-08-20 LAB — COMPREHENSIVE METABOLIC PANEL WITH GFR
ALT: 24 U/L (ref 0–35)
AST: 15 U/L (ref 0–37)
Albumin: 4.3 g/dL (ref 3.5–5.2)
Alkaline Phosphatase: 73 U/L (ref 39–117)
BUN: 27 mg/dL — ABNORMAL HIGH (ref 6–23)
CO2: 28 meq/L (ref 19–32)
Calcium: 9.8 mg/dL (ref 8.4–10.5)
Chloride: 101 meq/L (ref 96–112)
Creatinine, Ser: 1.12 mg/dL (ref 0.40–1.20)
GFR: 55.66 mL/min — ABNORMAL LOW (ref >60.00)
Glucose, Bld: 110 mg/dL — ABNORMAL HIGH (ref 70–99)
Potassium: 4.3 meq/L (ref 3.5–5.1)
Sodium: 140 meq/L (ref 135–145)
Total Bilirubin: 0.3 mg/dL (ref 0.2–1.2)
Total Protein: 6.8 g/dL (ref 6.0–8.3)

## 2023-08-20 LAB — LIPID PANEL
Cholesterol: 217 mg/dL — ABNORMAL HIGH (ref 0–200)
HDL: 41.1 mg/dL (ref >39.00)
LDL Cholesterol: 122 mg/dL — ABNORMAL HIGH (ref 0–99)
NonHDL: 176.26
Total CHOL/HDL Ratio: 5
Triglycerides: 272 mg/dL — ABNORMAL HIGH (ref 0.0–149.0)
VLDL: 54.4 mg/dL — ABNORMAL HIGH (ref 0.0–40.0)

## 2023-08-20 LAB — MICROALBUMIN / CREATININE URINE RATIO
Creatinine,U: 32.2 mg/dL
Microalb Creat Ratio: 186.1 mg/g — ABNORMAL HIGH (ref 0.0–30.0)
Microalb, Ur: 6 mg/dL — ABNORMAL HIGH (ref 0.0–1.9)

## 2023-08-20 MED ORDER — METFORMIN HCL 1000 MG PO TABS: 1000.0000 mg | ORAL_TABLET | Freq: Two times a day (BID) | ORAL | 3 refills | Status: AC

## 2023-08-20 MED ORDER — PROMETHAZINE HCL 12.5 MG PO TABS: ORAL_TABLET | ORAL | 3 refills | Status: AC

## 2023-08-20 MED ORDER — LOSARTAN POTASSIUM 100 MG PO TABS: 100.0000 mg | ORAL_TABLET | Freq: Every day | ORAL | 3 refills | Status: AC

## 2023-08-20 MED ORDER — ROSUVASTATIN CALCIUM 20 MG PO TABS: 20.0000 mg | ORAL_TABLET | Freq: Every day | ORAL | 3 refills | Status: DC

## 2023-08-20 MED ORDER — PANTOPRAZOLE SODIUM 40 MG PO TBEC: 40.0000 mg | DELAYED_RELEASE_TABLET | Freq: Every day | ORAL | 3 refills | Status: AC

## 2023-08-20 MED ORDER — METOPROLOL SUCCINATE ER 100 MG PO TB24: ORAL_TABLET | ORAL | 3 refills | Status: DC

## 2023-08-20 MED ORDER — VERAPAMIL HCL ER 240 MG PO TBCR: 240.0000 mg | EXTENDED_RELEASE_TABLET | Freq: Every day | ORAL | 3 refills | Status: DC

## 2023-08-20 MED ORDER — GABAPENTIN 300 MG PO CAPS: 300.0000 mg | ORAL_CAPSULE | Freq: Three times a day (TID) | ORAL | 3 refills | Status: DC

## 2023-08-20 NOTE — Progress Notes (Signed)
 OFFICE VISIT  08/20/2023  CC:  Chief Complaint  Patient presents with   Hypertension   Patient is a 53 y.o. female who presents for 61-month follow-up hyperlipidemia, hypertension, and chronic pain. A/P as of last visit:  mixed hyperlipidemia, LDL 98 and trigs 276 December 2023. We increased her rosuvastatin  to 10 mg every day at that time. She is not fasting today so we will recheck lipids in 6 months.   #3 hypertension, well-controlled on amlodipine  5 mg a day, losartan  100 mg a day, and Toprol -XL 200 mg a day. Check electrolytes and creatinine today.   #4  chronic pain: Fibromyalgia plus polymyalgia rheumatica. Stable.  Continue meloxicam  15 every day and as needed Vicodin (new rx not needed today). Continue gabapentin  300 mg 3 times daily. She is followed by rheumatology.  INTERIM HX: She describes some intermittent feeling of disequilibrium.  Often when she goes from sitting to standing or lying down to sitting up.  This led to a twisted right ankle a couple of days ago.  The ankle is swollen and she could hardly walk on it after.  She has been wearing a boot.  She takes 40 mg Lasix  in the morning and 20 mg in the evening. She does not feel like she has been hydrating as well lately.  Her chronic pain is occurring a bit more in right shoulder and left hip lately.  Diffuse myofascial pain pretty constant but stable. She takes meloxicam  15 mg daily and if she misses a day she feels much worse. She only occasionally takes Vicodin. PMP AWARE reviewed today: most recent rx for gabapentin  was filled 05/22/2023, # 270, rx by me. No red flags.  PMP AWARE reviewed today: most recent rx for Vicodin was filled to 03/31/22, # 40, rx by me. No red flags.  ROS as above, plus--> no fevers, no CP, no SOB, no wheezing, no cough,  no HAs, no rashes, no melena/hematochezia.  No polyuria or polydipsia.   No focal weakness, paresthesias, or tremors.  No acute vision or hearing abnormalities.  No  dysuria or unusual/new urinary urgency or frequency.  No recent changes in lower legs. No n/v/d or abd pain.  No palpitations.    Past Medical History:  Diagnosis Date   Abdominal bloating    likely from diab gastroparesis.  Dr. Leigh started trial of reglan  03/2019.   Benign brain tumor (HCC)    Cystic lesion in cerebral aqueduct region with mild hydrocephalus-- stable MRI 02/2016.  Surveillance MRI 05/2017 --dilated cerebral aqueduct related to aqueductal stenosis and subsequent mild hydrocephalus (due to the 11 mm stable cystic lesion in cerebral aqueduct---?congenitial?.   Bilateral lower extremity edema    Cataract    OU   Dysmenorrhea    vicodin occ during first 2 days of cycle.   Fibromyalgia    Gluten intolerance    pt reports she underwent full GI w/u to r/o celiac dz   Hepatic steatosis    ultrasound 08/2017. Hx of very mild elevation of ALT.  Stable on u/s 06/2020   History of adenomatous polyp of colon 04/08/2019   recall Feb 2024   Hyperlipidemia, mixed    Hypertension    +white coat component   Hypertensive retinopathy of both eyes    Insomnia    Iron deficiency 01/2019   Hb 11.3. Hemoccults neg x 3 03/19/19. EGD and colonoscopy 04/08/19 showed NO cause for IDA.  Pt does have menorrhagia, though, so she'll see her GYN.  started  FeSO4 325 qd approx 04/14/19.   Menorrhagia    resulting in IDA 2021   Orthostatic hypotension    PMR (polymyalgia rheumatica) (HCC) 2022   question of; hx of elevated ESR-->better with prednisone  but prednisone  was contraindicated d/t eye issues/hyperglycemia-->rheum started following her 02/2021->plaquenil 04/2021   Proliferative diabetic retinopathy of both eyes (HCC)    steroid injections 10/2017--improved   Sensorineural hearing loss of left ear    Sudden left hearing loss summer 2016--no improvement with steroids 01/2015 so brain MRI done by Dr. Carlie and it showed brain tumor that was determined to be benign.  Pt's hearing not bad enough for  hearing aid as of 06/2016.   Type 2 diabetes with complication (HCC)    +microalbuminuria, diab retpthy, diabetic gastroparesis (gastric emptying study mildly abnl 03/2017).  Recommended lantus  08/2018 but pt declined. Mild microalbuminuria.   Uterine fibroid 2022   per pt report, pelvic u/s in GYN office    Past Surgical History:  Procedure Laterality Date   ANOSCOPY  05/12/2019   Procedure: normal exam, minimal hemorrhoid disease. Hyertrophied anal papila, benign appearing, posterior midline. Surgeon: Bernarda Ned MD   CHOLECYSTECTOMY  2000   COLONOSCOPY  04/08/2019   5 adenomas, recall 3 yrs; no cause for IDA found.  Hypertrophied anal papillae->bx showed low grade dysplasia; GI referred her to colorectal surgeon.   ESOPHAGOGASTRODUODENOSCOPY  04/08/2019   mild chronic reactive gastritis. H pylori NEG.  No cause for IDA found.   GAS INSERTION Right 10/31/2018   Procedure: Insertion Of C3F8 Gas;  Surgeon: Valdemar Rogue, MD;  Location: [facility] OR;  Service: Ophthalmology;  Laterality: Right;   GASTRIC EMPTYING SCAN  04/20/2017   Mildly abnormal, particularly the 1st hour of emptying.   MEMBRANE PEEL Right 10/31/2018   Procedure: MEMBRANE PEEL;  Surgeon: Valdemar Rogue, MD;  Location: [facility] OR;  Service: Ophthalmology;  Laterality: Right;   PARS PLANA VITRECTOMY Right 04/12/2018   Procedure: Right PARS PLANA VITRECTOMY WITH 25 GAUGE with intravitreal antibiotics;  Surgeon: Valdemar Rogue, MD;  Location: [facility] OR;  Service: Ophthalmology;  Laterality: Right;   PARS PLANA VITRECTOMY Right 10/31/2018   Procedure: PARS PLANA VITRECTOMY WITH 25 GAUGE;  Surgeon: Valdemar Rogue, MD;  Location: [facility] OR;  Service: Ophthalmology;  Laterality: Right;   PHOTOCOAGULATION WITH LASER Right 10/31/2018   Procedure: Photocoagulation With Laser;  Surgeon: Valdemar Rogue, MD;  Location: [facility] OR;  Service: Ophthalmology;  Laterality: Right;   TRANSTHORACIC ECHOCARDIOGRAM  08/23/2020   Grd I DD, o/w normal.    Outpatient  Medications Prior to Visit  Medication Sig Dispense Refill   amLODipine  (NORVASC ) 5 MG tablet Take 1 tablet (5 mg total) by mouth daily. 90 tablet 1   Continuous Blood Gluc Sensor (FREESTYLE LIBRE 3 SENSOR) MISC by Does not apply route every 14 (fourteen) days.     FARXIGA 10 MG TABS tablet Take 10 mg by mouth daily.     furosemide  (LASIX ) 20 MG tablet TAKE 2 TABLETS BY MOUTH IN THE MORNING AND 1 IN THE EVENING 270 tablet 1   HUMALOG KWIKPEN 100 UNIT/ML KwikPen Inject into the skin.     HYDROcodone -acetaminophen  (NORCO/VICODIN) 5-325 MG tablet Take 1-2 tablets by mouth every 6 (six) hours as needed for moderate pain. 40 tablet 0   insulin aspart (NOVOLOG) 100 UNIT/ML injection Inject 10 Units into the skin 2 (two) times daily with a meal. Per endocrinologist     meloxicam  (MOBIC ) 15 MG tablet Take 1 tablet daily as needed. 30 tablet  0   metoCLOPramide  (REGLAN ) 5 MG tablet TAKE 1 TABLET BY MOUTH 4 TIMES DAILY BEFORE MEAL(S) AND AT BEDTIME 360 tablet 1   MOUNJARO 12.5 MG/0.5ML Pen Inject 12.5 mg into the skin once a week.     Multiple Vitamin (MULTIVITAMIN WITH MINERALS) TABS tablet Take 1 tablet by mouth daily.     NOVOLOG FLEXPEN 100 UNIT/ML FlexPen      verapamil  (CALAN -SR) 240 MG CR tablet Take by mouth.     promethazine  (PHENERGAN ) 12.5 MG tablet TAKE 1 TO 2 TABLETS BY MOUTH EVERY 6 HOURS AS NEEDED FOR NAUSEA 30 tablet 0   Insulin Glargine  (BASAGLAR KWIKPEN) 100 UNIT/ML 10 units every morning and 24 units nightly (Patient not taking: Reported on 08/20/2023)     gabapentin  (NEURONTIN ) 300 MG capsule TAKE 1 CAPSULE BY MOUTH THREE TIMES DAILY 90 capsule 0   losartan  (COZAAR ) 100 MG tablet Take 1 tablet by mouth once daily 30 tablet 0   metFORMIN  (GLUCOPHAGE ) 1000 MG tablet TAKE 1 TABLET BY MOUTH TWICE DAILY WITH MEALS 60 tablet 0   metoprolol  succinate (TOPROL -XL) 100 MG 24 hr tablet Take 2 tablets by mouth once daily 180 tablet 1   pantoprazole  (PROTONIX ) 40 MG tablet Take 1 tablet by mouth once  daily 90 tablet 0   rosuvastatin  (CRESTOR ) 20 MG tablet Take 1 tablet (20 mg total) by mouth daily. 30 tablet 2   TRULICITY 1.5 MG/0.5ML SOPN Inject into the skin. (Patient not taking: Reported on 08/20/2023)     verapamil  (CALAN -SR) 240 MG CR tablet TAKE 1 TABLET BY MOUTH ONCE DAILY AT BEDTIME 90 tablet 1   VICTOZA  18 MG/3ML SOPN INJECT 1.2MG  INTO THE SKIN DAILY (Patient not taking: Reported on 08/20/2023) 18 mL 0   No facility-administered medications prior to visit.    Allergies  Allergen Reactions   Gluten Meal Swelling   Lisinopril  Cough   Pioglitazone  Other (See Comments)    ELEVATED glucoses + worse chronic nausea    Review of Systems As per HPI  PE:    08/20/2023    1:06 PM 02/19/2023   11:36 AM 02/19/2023   10:47 AM  Vitals with BMI  Height   5' 4  Weight 169 lbs 6 oz  176 lbs 10 oz  BMI   30.3  Systolic 112 146 839  Diastolic 76 84 92  Pulse 81  85     Physical Exam  Gen: Alert, well appearing.  Patient is oriented to person, place, time, and situation. AFFECT: pleasant, lucid thought and speech. No significant pitting edema in the legs. She has moderate soft tissue swelling in the right ankle laterally. Significant tenderness to palpation over the lateral ankle including the malleolus and the base of the fifth metatarsal. She can to a few degrees of range of motion in flexion, extension, inversion, and eversion.  I did not test ankle stability due to pain. No ankle warmth, erythema, or bruising.  LABS:  Last CBC Lab Results  Component Value Date   WBC 10.3 02/08/2022   HGB 12.0 02/08/2022   HCT 35.9 02/08/2022   MCV 82.5 02/08/2022   MCH 27.6 02/08/2022   RDW 14.9 02/08/2022   PLT 410 (H) 02/08/2022   Last metabolic panel Lab Results  Component Value Date   GLUCOSE 110 (H) 08/20/2023   NA 140 08/20/2023   K 4.3 08/20/2023   CL 101 08/20/2023   CO2 28 08/20/2023   BUN 27 (H) 08/20/2023   CREATININE 1.12 08/20/2023  GFR 55.66 (L) 08/20/2023    CALCIUM  9.8 08/20/2023   PROT 6.8 08/20/2023   ALBUMIN 4.3 08/20/2023   BILITOT 0.3 08/20/2023   ALKPHOS 73 08/20/2023   AST 15 08/20/2023   ALT 24 08/20/2023   ANIONGAP 12 10/31/2018   Last lipids Lab Results  Component Value Date   CHOL 217 (H) 08/20/2023   HDL 41.10 08/20/2023   LDLCALC 122 (H) 08/20/2023   LDLDIRECT 77.0 07/19/2016   TRIG 272.0 (H) 08/20/2023   CHOLHDL 5 08/20/2023   Last hemoglobin A1c Lab Results  Component Value Date   HGBA1C 6.5 04/09/2023   Last thyroid  functions Lab Results  Component Value Date   TSH 1.15 02/19/2023   T3TOTAL 129 11/10/2020   Last vitamin B12 and Folate Lab Results  Component Value Date   VITAMINB12 967 01/29/2019   IMPRESSION AND PLAN:  #1 chronic pain syndrome.  Fibromyalgia stable on meloxicam  15 mg a day.  Continue gabapentin  300 mg 3 times daily. She takes Vicodin rarely.  Refill was not needed today. Monitor renal function today.  2.  Hypertension, well-controlled on amlodipine  5 mg daily, losartan  100 mg daily, and Toprol -XL 200 mg a day.  #3 mixed hyperlipidemia. LDL was 143 about 7 months ago, at which time we increased her rosuvastatin  to 20 mg a day. Fasting lipid panel and hepatic panel today.  #4 right ankle sprain. Limited MSK ultrasound today: No ankle effusion.  Peroneal tendons and extensor tendons intact and without fluid in sheath.  Anterior talofibular ligament with surrounding edematous changes but no clear disruption of fibers.  Recommended she continue walking boot for 1 more week and then start ankle lace up support as long as it is feeling improved. Will check ankle x-ray as well.  #5 orthostatic dizziness. Encouraged good hydration.  Compression hose recommended--prescription given today.  #6 DM managed by endo (Dr. Luana).   Patient reports great control lately without the use of any insulin.  Currently on Farxiga, Mounjaro, and metformin .  An After Visit Summary was printed and  given to the patient.  FOLLOW UP: Return in about 6 months (around 02/19/2024) for annual CPE (fasting).  Signed:  Gerlene Hockey, MD           08/20/2023

## 2023-08-21 ENCOUNTER — Other Ambulatory Visit: Payer: Self-pay

## 2023-08-21 ENCOUNTER — Ambulatory Visit: Payer: Self-pay | Admitting: Family Medicine

## 2023-08-21 DIAGNOSIS — E7800 Pure hypercholesterolemia, unspecified: Secondary | ICD-10-CM

## 2023-08-21 DIAGNOSIS — E785 Hyperlipidemia, unspecified: Secondary | ICD-10-CM | POA: Diagnosis not present

## 2023-08-21 DIAGNOSIS — E1165 Type 2 diabetes mellitus with hyperglycemia: Secondary | ICD-10-CM | POA: Diagnosis not present

## 2023-08-21 DIAGNOSIS — I10 Essential (primary) hypertension: Secondary | ICD-10-CM | POA: Diagnosis not present

## 2023-08-21 MED ORDER — ROSUVASTATIN CALCIUM 40 MG PO TABS: 40.0000 mg | ORAL_TABLET | Freq: Every day | ORAL | 2 refills | Status: AC

## 2023-08-23 ENCOUNTER — Ambulatory Visit

## 2023-08-23 DIAGNOSIS — S93491A Sprain of other ligament of right ankle, initial encounter: Secondary | ICD-10-CM | POA: Diagnosis not present

## 2023-08-23 NOTE — Progress Notes (Signed)
 [facility] - Clinic Note  08/24/2023     CHIEF COMPLAINT Patient presents for Retina Follow Up  HISTORY OF PRESENT ILLNESS: Karmin Kasprzak is a 53 y.o. female who presents to the clinic today for:  HPI     Retina Follow Up   Patient presents with  Diabetic Retinopathy.  In both eyes.  This started 4 weeks ago.  Duration of weeks.  Since onset it is stable.  I, the attending physician,  performed the HPI with the patient and updated documentation appropriately.        Comments   4 week retina follow up PDR OU and I'VE HD OD pt is reporting vision about the same she denies any flashes or floaters last reading 107       Last edited by Valdemar Rogue, MD on 08/24/2023  4:10 PM.     Patient fell and twister her ankle since she was here last  Referring physician:   HISTORICAL INFORMATION:   Selected notes from the MEDICAL RECORD NUMBER Referred for DM exam   CURRENT MEDICATIONS: No current outpatient medications on file. (Ophthalmic Drugs)   No current facility-administered medications for this visit. (Ophthalmic Drugs)   Current Outpatient Medications (Other)  Medication Sig   amLODipine  (NORVASC ) 5 MG tablet Take 1 tablet (5 mg total) by mouth daily.   Continuous Blood Gluc Sensor (FREESTYLE LIBRE 3 SENSOR) MISC by Does not apply route every 14 (fourteen) days.   FARXIGA 10 MG TABS tablet Take 10 mg by mouth daily.   furosemide  (LASIX ) 20 MG tablet TAKE 2 TABLETS BY MOUTH IN THE MORNING AND 1 IN THE EVENING   gabapentin  (NEURONTIN ) 300 MG capsule Take 1 capsule (300 mg total) by mouth 3 (three) times daily.   HUMALOG KWIKPEN 100 UNIT/ML KwikPen Inject into the skin.   HYDROcodone -acetaminophen  (NORCO/VICODIN) 5-325 MG tablet Take 1-2 tablets by mouth every 6 (six) hours as needed for moderate pain.   insulin aspart (NOVOLOG) 100 UNIT/ML injection Inject 10 Units into the skin 2 (two) times daily with a meal. Per endocrinologist   Insulin Glargine   (BASAGLAR KWIKPEN) 100 UNIT/ML 10 units every morning and 24 units nightly (Patient not taking: Reported on 08/20/2023)   losartan  (COZAAR ) 100 MG tablet Take 1 tablet (100 mg total) by mouth daily.   meloxicam  (MOBIC ) 15 MG tablet Take 1 tablet daily as needed.   metFORMIN  (GLUCOPHAGE ) 1000 MG tablet Take 1 tablet (1,000 mg total) by mouth 2 (two) times daily with a meal.   metoCLOPramide  (REGLAN ) 5 MG tablet TAKE 1 TABLET BY MOUTH 4 TIMES DAILY BEFORE MEAL(S) AND AT BEDTIME   metoprolol  succinate (TOPROL -XL) 100 MG 24 hr tablet Take 2 tablets by mouth once daily   MOUNJARO 12.5 MG/0.5ML Pen Inject 12.5 mg into the skin once a week.   Multiple Vitamin (MULTIVITAMIN WITH MINERALS) TABS tablet Take 1 tablet by mouth daily.   NOVOLOG FLEXPEN 100 UNIT/ML FlexPen    pantoprazole  (PROTONIX ) 40 MG tablet Take 1 tablet (40 mg total) by mouth daily.   promethazine  (PHENERGAN ) 12.5 MG tablet TAKE 1 TO 2 TABLETS BY MOUTH EVERY 6 HOURS AS NEEDED FOR NAUSEA   rosuvastatin  (CRESTOR ) 40 MG tablet Take 1 tablet (40 mg total) by mouth daily.   verapamil  (CALAN -SR) 240 MG CR tablet Take by mouth.   verapamil  (CALAN -SR) 240 MG CR tablet Take 1 tablet (240 mg total) by mouth at bedtime.   No current facility-administered medications for this visit. (Other)  REVIEW OF SYSTEMS: ROS   Positive for: Endocrine, Eyes Negative for: Constitutional, Gastrointestinal, Neurological, Skin, Genitourinary, Musculoskeletal, HENT, Cardiovascular, Respiratory, Psychiatric, Allergic/Imm, Heme/Lymph Last edited by Resa Delon ORN, COT on 08/24/2023 12:36 PM.      ALLERGIES Allergies  Allergen Reactions   Gluten Meal Swelling   Lisinopril  Cough   Pioglitazone  Other (See Comments)    ELEVATED glucoses + worse chronic nausea   PAST MEDICAL HISTORY Past Medical History:  Diagnosis Date   Abdominal bloating    likely from diab gastroparesis.  Dr. Leigh started trial of reglan  03/2019.   Benign brain tumor (HCC)     Cystic lesion in cerebral aqueduct region with mild hydrocephalus-- stable MRI 02/2016.  Surveillance MRI 05/2017 --dilated cerebral aqueduct related to aqueductal stenosis and subsequent mild hydrocephalus (due to the 11 mm stable cystic lesion in cerebral aqueduct---?congenitial?.   Bilateral lower extremity edema    Cataract    OU   Dysmenorrhea    vicodin occ during first 2 days of cycle.   Fibromyalgia    Fibula fracture 07/2023   distal, nondisplaced (Right)   Gluten intolerance    pt reports she underwent full GI w/u to r/o celiac dz   Hepatic steatosis    ultrasound 08/2017. Hx of very mild elevation of ALT.  Stable on u/s 06/2020   History of adenomatous polyp of colon 04/08/2019   recall Feb 2024   Hyperlipidemia, mixed    Hypertension    +white coat component   Hypertensive retinopathy of both eyes    Insomnia    Iron deficiency 01/2019   Hb 11.3. Hemoccults neg x 3 03/19/19. EGD and colonoscopy 04/08/19 showed NO cause for IDA.  Pt does have menorrhagia, though, so she'll see her GYN.  started FeSO4 325 qd approx 04/14/19.   Menorrhagia    resulting in IDA 2021   Orthostatic hypotension    PMR (polymyalgia rheumatica) (HCC) 2022   question of; hx of elevated ESR-->better with prednisone  but prednisone  was contraindicated d/t eye issues/hyperglycemia-->rheum started following her 02/2021->plaquenil 04/2021   Proliferative diabetic retinopathy of both eyes (HCC)    steroid injections 10/2017--improved   Sensorineural hearing loss of left ear    Sudden left hearing loss summer 2016--no improvement with steroids 01/2015 so brain MRI done by Dr. Carlie and it showed brain tumor that was determined to be benign.  Pt's hearing not bad enough for hearing aid as of 06/2016.   Type 2 diabetes with complication (HCC)    +microalbuminuria, diab retpthy, diabetic gastroparesis (gastric emptying study mildly abnl 03/2017).  Recommended lantus  08/2018 but pt declined. Mild microalbuminuria.    Uterine fibroid 2022   per pt report, pelvic u/s in GYN office   Past Surgical History:  Procedure Laterality Date   ANOSCOPY  05/12/2019   Procedure: normal exam, minimal hemorrhoid disease. Hyertrophied anal papila, benign appearing, posterior midline. Surgeon: Bernarda Ned MD   CHOLECYSTECTOMY  2000   COLONOSCOPY  04/08/2019   5 adenomas, recall 3 yrs; no cause for IDA found.  Hypertrophied anal papillae->bx showed low grade dysplasia; GI referred her to colorectal surgeon.   ESOPHAGOGASTRODUODENOSCOPY  04/08/2019   mild chronic reactive gastritis. H pylori NEG.  No cause for IDA found.   GAS INSERTION Right 10/31/2018   Procedure: Insertion Of C3F8 Gas;  Surgeon: Valdemar Rogue, MD;  Location: [facility] OR;  Service: Ophthalmology;  Laterality: Right;   GASTRIC EMPTYING SCAN  04/20/2017   Mildly abnormal, particularly the 1st hour of emptying.  MEMBRANE PEEL Right 10/31/2018   Procedure: MEMBRANE PEEL;  Surgeon: Valdemar Rogue, MD;  Location: [facility] OR;  Service: Ophthalmology;  Laterality: Right;   PARS PLANA VITRECTOMY Right 04/12/2018   Procedure: Right PARS PLANA VITRECTOMY WITH 25 GAUGE with intravitreal antibiotics;  Surgeon: Valdemar Rogue, MD;  Location: [facility] OR;  Service: Ophthalmology;  Laterality: Right;   PARS PLANA VITRECTOMY Right 10/31/2018   Procedure: PARS PLANA VITRECTOMY WITH 25 GAUGE;  Surgeon: Valdemar Rogue, MD;  Location: [facility] OR;  Service: Ophthalmology;  Laterality: Right;   PHOTOCOAGULATION WITH LASER Right 10/31/2018   Procedure: Photocoagulation With Laser;  Surgeon: Valdemar Rogue, MD;  Location: [facility] OR;  Service: Ophthalmology;  Laterality: Right;   TRANSTHORACIC ECHOCARDIOGRAM  08/23/2020   Grd I DD, o/w normal.   FAMILY HISTORY Family History  Problem Relation Age of Onset   Brain cancer Mother    Diabetes Father    Diabetes Maternal Grandmother    Cataracts Maternal Grandmother    Cervical cancer Paternal Grandmother    Colon cancer Maternal Grandfather 41    Amblyopia Neg Hx    Blindness Neg Hx    Glaucoma Neg Hx    Macular degeneration Neg Hx    Retinal detachment Neg Hx    Strabismus Neg Hx    Retinitis pigmentosa Neg Hx    Esophageal cancer Neg Hx    Stomach cancer Neg Hx    Rectal cancer Neg Hx    SOCIAL HISTORY Social History   Tobacco Use   Smoking status: Never   Smokeless tobacco: Never  Vaping Use   Vaping status: Never Used  Substance Use Topics   Alcohol use: No   Drug use: No       OPHTHALMIC EXAM:  Base Eye Exam     Visual Acuity (Snellen - Linear)       Right Left   Dist cc 20/25 -1 20/25 -1   Dist ph cc NI NI    Correction: Glasses         Tonometry (Tonopen, 12:41 PM)       Right Left   Pressure 14 15         Pupils       Pupils Dark Light Shape React APD   Right PERRL 3 2 Round Brisk None   Left PERRL 3 2 Round Brisk None         Visual Fields       Left Right    Full Full         Neuro/Psych     Oriented x3: Yes   Mood/Affect: Normal         Dilation     Both eyes: 2.5% Phenylephrine  @ 12:41 PM           Slit Lamp and Fundus Exam     Slit Lamp Exam       Right Left   Lids/Lashes Dermatochalasis - upper lid, mild Meibomian gland dysfunction, Telangiectasia Dermatochalasis - upper lid, Meibomian gland dysfunction, Telangiectasia   Conjunctiva/Sclera White and quiet White and quiet   Cornea Clear, well healed temporal cataract wounds Trace Punctate epithelial erosions, trace EBMD   Anterior Chamber Deep and quiet Deep and quiet   Iris Round and dilated, No NVI Round and dilated, No NVI   Lens PC IOL in good position, 1-2+ Posterior capsular opacification, PC folds 2-3+ Nuclear sclerosis with mild brunescence, 2-3+ Cortical cataract, 1+ Posterior subcapsular cataract   Anterior Vitreous post vitrectomy, trace pigment Vitreous syneresis,  vitreous condensations, no frank red heme, old white VH clearing and settled inferiorly -- improved         Fundus Exam        Right Left   Disc mild Pallor, Sharp rim, temporal PPA Pink and sharp, Compact, PPA   C/D Ratio 0.2 0.1   Macula Flat, good foveal reflex, mild cystic changes centrally -- slightly improved, focal MA and exudates ST mac -- improved, +focal laser changes Flat, good foveal reflex, trace cystic changes, scattered MA; trace ERM, light focal laser changes   Vessels attenuated, Tortuous attenuated, Tortuous   Periphery Attached, rare MA, scattered DBH greatest posteriorly, 360 PRP, good laser fill in 360 attached, scattered IRH; 360 PRP scars -- with good fill in changes           Refraction     Wearing Rx       Sphere Cylinder   Right -0.25 Sphere   Left -4.50 Sphere           IMAGING AND PROCEDURES  Imaging and Procedures for   OCT, Retina - OU - Both Eyes       Right Eye Quality was good. Central Foveal Thickness: 317. Progression has improved. Findings include no SRF, abnormal foveal contour, intraretinal hyper-reflective material, epiretinal membrane, intraretinal fluid (interval improvement in IRF/cystic changes ).   Left Eye Quality was good. Central Foveal Thickness: 274. Progression has worsened. Findings include normal foveal contour, no IRF, no SRF, intraretinal hyper-reflective material (Mild interval increase in IRF/cystic changes temporal fovea, stable improvement in vitreous opacities).   Notes *Images captured and stored on drive  Diagnosis / Impression:  +DME OU OD: interval improvement in IRF/cystic changes  OS: Mild interval increase in IRF/cystic changes temporal fovea,  stable improvement in vitreous opacities  Clinical management:  See below  Abbreviations: NFP - Normal foveal profile. CME - cystoid macular edema. PED - pigment epithelial detachment. IRF - intraretinal fluid. SRF - subretinal fluid. EZ - ellipsoid zone. ERM - epiretinal membrane. ORA - outer retinal atrophy. ORT - outer retinal tubulation. SRHM - subretinal hyper-reflective  material       Intravitreal Injection, Pharmacologic Agent - OD - Right Eye       Time Out 08/24/2023. 1:36 PM. Confirmed correct patient, procedure, site, and patient consented.   Anesthesia Topical anesthesia was used. Anesthetic medications included Lidocaine  2%, Proparacaine  0.5%.   Procedure Preparation included 5% betadine to ocular surface, eyelid speculum. A (32g) needle was used.   Injection: 8 mg aflibercept  8 MG/0.07ML (Patient supplied)   Route: Intravitreal, Site: Right Eye   NDC: R7037034, Lot: [id number], Expiration date: 12/27/2023, Waste: 0 mL   Post-op Post injection exam found visual acuity of at least counting fingers. The patient tolerated the procedure well. There were no complications. The patient received written and verbal post procedure care education.   Notes **SAMPLE MEDICATION ADMINISTERED**     Intravitreal Injection, Pharmacologic Agent - OS - Left Eye       Time Out 08/24/2023. 1:36 PM. Confirmed correct patient, procedure, site, and patient consented.   Anesthesia Topical anesthesia was used. Anesthetic medications included Lidocaine  2%, Proparacaine  0.5%.   Procedure Preparation included 5% betadine to ocular surface, eyelid speculum. A (32g) needle was used.   Injection: 6 mg faricimab -svoa 6 MG/0.05ML (Patient supplied)   Route: Intravitreal, Site: Left Eye   NDC: 49757-903-98, Lot: [id number], Expiration date: 01/27/2024, Waste: 0 mL   Post-op Post injection exam found visual acuity of at least  counting fingers. The patient tolerated the procedure well. There were no complications. The patient received written and verbal post procedure care education.   Notes **PAP MEDICATION ADMINISTERED**            ASSESSMENT/PLAN:   ICD-10-CM   1. Proliferative diabetic retinopathy of both eyes with macular edema associated with type 2 diabetes mellitus (HCC)  E11.3513 OCT, Retina - OU - Both Eyes    Intravitreal Injection,  Pharmacologic Agent - OD - Right Eye    Intravitreal Injection, Pharmacologic Agent - OS - Left Eye    faricimab -svoa (VABYSMO ) 6mg /0.63mL intravitreal injection    aflibercept  (EYLEA  HD) ophthalmic injection 8 mg    2. Current use of insulin (HCC)  Z79.4     3. Long term (current) use of oral hypoglycemic drugs  Z79.84     4. Long-term (current) use of injectable non-insulin antidiabetic drugs  Z79.85     5. Vitreous hemorrhage of left eye (HCC)  H43.12     6. Vitreous hemorrhage, right eye (HCC)  H43.11     7. Hypertensive retinopathy of both eyes  H35.033     8. Essential hypertension  I10     9. Right endophthalmia  H44.001     10. Combined forms of age-related cataract of left eye  H25.812     11. Pseudophakia of right eye  Z96.1      1-4.  Proliferative diabetic retinopathy w/ DME, OU - HbA1c 7.9 (10.08.24),8.6 (06.11.24), 7.1 (3.23.23), 8.6 (9.14.22), 7.8 (06.13.22) - s/p IVA OD #1 9.20.19, #2 (10.25.19), #3 (11.15.19), #4 (12.17.19), #5 (01.14.20), #6 (2.11.20), #7 (05.29.20), #8 (08.19.20), #9 (10.30.20), #10 (12.09.20), #11 (01.11.21), #12 (02.15.21), #13 (03.23.21), #14 (04.20.21), #15 (06.08.21), #16 (07.06.21) -- IVA resistance - s/p IVA OS #1 9.27.19, #2 (10.25.19), #3 (11.15.19), #4 (12.17.19), #5 (01.14.20), #6 (2.11.20), #7 (04.26.20), #8 (05.29.20), #9 (06.26.20), #10 (08.05.20), #11 (11.11.20), #12 (12.09.20), #13 (01.11.21), #14 (02.15.21), #15 (03.23.21), #16 (04.20.21), #17 (06.08.21) -- IVA resistance, #18 (09.27.21) ===================== - s/p IVE OD #1 (08.03.21) -- sample, #2 (09.10.21), #3 (10.08.21), #4 (11.23.21), #5 (12.21.21), #6 (01.19.22) sample, #7 (2.16.22), #8 (3.22.22), #9 (04.19.22), #10 (05.27.22), #11 (06.29.22), #12 (08.03.22), #13 (09.07.22), #14 (10.10.22), #15 (11.14.22), #16 (12.12.22), #17 (01.10.23) - sample, #18 (02.07.23), #19 (03.21.23), #20 (04.18.23), #21 (05.16.23) -- IVE resistance ===================== - s/p IVE OS #1 (10.25.21),  #2 (11.23.21), #3 (12.21.21), #4 (3.22.22), #5 (06.29.22), #6 (08.03.22), #7 (09.07.22), #8 (10.10.22), #9 (11.14.22), #10 (02.07.23), #11 (05.16.23), #12 (08.15.23), #13 (11.14.23), #14 (02.06.24), #15 (05.06.24), #16 SAMPLE (07.01.24), #17 (09.26.24), #18 (12.20.24) #19 (03.14.25) ===================== - IVV OD #1 (06.16.23 -- sample), #2 (07.17.23), #3 (08.15.23), #4 (09.12.23), #5 (10.10.23), #6 (11.14.23), #7 (12.12.23), #8 (01.09.23), #9 (02.06.24), #10 (03.11.24), #11 (04.08.24), #12 (05.06.24), #13 (06.03.24), #14 (07.01.24), #15 (07.29.24), #16 (08.26.24)- IVV resistance ====================== - s/p IVE HD OD #1 (sample 09.26.24), #2 (10.25.24), #3 (11.21.24), #4 (12.20.24), #5 (01.17.25 -- sample), #6 (02.14.25 sample), #7 (03.14.25 -- sample)  #9 (04.11.25 -- sample) #10 (05.23.25 - sample)    - S/P PRP OS (09.20.19), (5.19.20), (08.19.20), (04.28.21), (02.02.22) - S/P PRP OD (9.27.19 and 11.21.19), fill-in (04.14.20) (09.03.20, surgery)  - S/P focal laser OS (07.06.21), OD (09.20.22) - FA (9.20.19) shows +NVE OU and leaking MA and capillary nonperfusion  - repeat FA 11.15.19 shows NV regressing OU - pre-op: OD w/ VA down at 20/25, but there is some preretinal fibrosis / tractional membranes just superior to disc and mild central DME - s/p 25g PPV+MP+10% C3F8  gas OD (09.03.20) -- ERM/PRF removal OD  - BCVA: OD 20/25 (decreased) OS 20/25 (stable)             - fibrosis/ERM stably improved; retina attached - OCT shows OD: interval improvement in IRF/cystic changes; OS: Mild interval increase in IRF/cystic changes temporal fovea,  stable improvement in vitreous opacities at 6 wks - recommend IVE HD OD #11 (SAMPLE) and IVV OS (PAP) today, 06.27.25 with follow up in 5 weeks again - [employer] funding unavailable **OS on ~q58m maintenance treatment schedule -- due again ~September**  - RBA of procedure discussed, questions answered - see procedure note  - Eylea  informed consent form re-signed  and scanned on 05.16.2023  - Vabysmo  informed consent form signed on 06.16.23 - Eylea  HD informed consent obtained, resigned, and scanned on 10.25.24  - Eylea  HD approved for 2024 -- but no funding for [employer]  - f/u 5 weeks, DFE, OCT, possible injection(s)  5. Vitreous hemorrhage OS -- stably improved  - recurrent VH, onset 09.22.21  - etiology: secondary to PDR as described above (no RT/RD on exam)  - s/p PRP OS (9.20.19), (05.19.20), (08.19.20), (04.28.21), (02.02.22) - s/p IVA OS on 4.26.20, 5.29.20, 6.26.20, 8.5.20,11.11.20, 12.06.20 and so on as above   - Vit condensations stably improved  - BCVA stable at 20/25  - IVEs as above  - f/u 4 weeks DFE, OCT  6. History of Endophthalmitis OD  - s/p IVA OU 04/09/2018  - s/p 25g PPV w/ intravitreal vanc, ceftaz and cefepime  OD, 2.14.2020  - s/p intravitreal tap / vanc and ceflaz injections (02.16.20)             - gram stain (2.14.20) shows G+ cocci, WBCs mostly PMNs;   - repeat gram stain from t/i (2.16.20) -- no organisms, just WBCs  - cultures from vitreous grew rare Staph warneri; cultures from t/i -- no growth             - doing well, BCVA 20/25             - inflammation/posterior debris resolved  - monitor  7. History of Vitreous Hemorrhage OD -- cleared from PPV x2 for endophthalmitis and ERM/preretinal fibrosis   - secondary to PDR  8,9. Hypertensive retinopathy OU  - discussed importance of tight BP control.  - monitor  10. Combined form age related cataract OS - The symptoms of cataract, surgical options, and treatments and risks were discussed with patient.   - discussed diagnosis and progression  11. Pseudophakia OD  - s/p CE/IOL OD (Dr. CANDIE Gaudy, 12.11.20)  - beautiful surgery, doing well  Ophthalmic Meds Ordered this visit:  Meds ordered this encounter  Medications   faricimab -svoa (VABYSMO ) 6mg /0.41mL intravitreal injection   aflibercept  (EYLEA  HD) ophthalmic injection 8 mg     Return in about 5 weeks  (around 09/28/2023) for f/u PDR OU, DFE, OCT, Possible Injxn.  There are no Patient Instructions on file for this visit.  This document serves as a record of services personally performed by Redell JUDITHANN Hans, MD, PhD. It was created on their behalf by Delon Newness COT, an ophthalmic technician. The creation of this record is the provider's dictation and/or activities during the visit.    Electronically signed by: Delon Newness COT 06.26.25 8:39 PM  This document serves as a record of services personally performed by Redell JUDITHANN Hans, MD, PhD. It was created on their behalf by Alan PARAS. Delores, OA an ophthalmic technician. The creation  of this record is the provider's dictation and/or activities during the visit.    Electronically signed by: Alan PARAS. Delores, OA 08/26/23 8:39 PM  Redell JUDITHANN Hans, M.D., Ph.D. Diseases & Surgery of the Retina and Vitreous [facility] 08/24/2023   I have reviewed the above documentation for accuracy and completeness, and I agree with the above. Redell JUDITHANN Hans, M.D., Ph.D. 08/26/23 8:47 PM    Abbreviations: M myopia (nearsighted); A astigmatism; H hyperopia (farsighted); P presbyopia; Mrx spectacle prescription;  CTL contact lenses; OD right eye; OS left eye; OU both eyes  XT exotropia; ET esotropia; PEK punctate epithelial keratitis; PEE punctate epithelial erosions; DES dry eye syndrome; MGD meibomian gland dysfunction; ATs artificial tears; PFAT's preservative free artificial tears; NSC nuclear sclerotic cataract; PSC posterior subcapsular cataract; ERM epi-retinal membrane; PVD posterior vitreous detachment; RD retinal detachment; DM diabetes mellitus; DR diabetic retinopathy; NPDR non-proliferative diabetic retinopathy; PDR proliferative diabetic retinopathy; CSME clinically significant macular edema; DME diabetic macular edema; dbh dot blot hemorrhages; CWS cotton wool spot; POAG primary open angle glaucoma; C/D cup-to-disc ratio;  HVF humphrey visual field; GVF goldmann visual field; OCT optical coherence tomography; IOP intraocular pressure; BRVO Branch retinal vein occlusion; CRVO central retinal vein occlusion; CRAO central retinal artery occlusion; BRAO branch retinal artery occlusion; RT retinal tear; SB scleral buckle; PPV pars plana vitrectomy; VH Vitreous hemorrhage; PRP panretinal laser photocoagulation; IVK intravitreal kenalog ; VMT vitreomacular traction; MH Macular hole;  NVD neovascularization of the disc; NVE neovascularization elsewhere; AREDS age related eye disease study; ARMD age related macular degeneration; POAG primary open angle glaucoma; EBMD epithelial/anterior basement membrane dystrophy; ACIOL anterior chamber intraocular lens; IOL intraocular lens; PCIOL posterior chamber intraocular lens; Phaco/IOL phacoemulsification with intraocular lens placement; PRK photorefractive keratectomy; LASIK laser assisted in situ keratomileusis; HTN hypertension; DM diabetes mellitus; COPD chronic obstructive pulmonary disease

## 2023-08-24 ENCOUNTER — Encounter: Payer: Self-pay | Admitting: Family Medicine

## 2023-08-24 ENCOUNTER — Ambulatory Visit (INDEPENDENT_AMBULATORY_CARE_PROVIDER_SITE_OTHER): Admitting: Ophthalmology

## 2023-08-24 DIAGNOSIS — E113513 Type 2 diabetes mellitus with proliferative diabetic retinopathy with macular edema, bilateral: Secondary | ICD-10-CM

## 2023-08-24 DIAGNOSIS — Z794 Long term (current) use of insulin: Secondary | ICD-10-CM | POA: Diagnosis not present

## 2023-08-24 DIAGNOSIS — Z961 Presence of intraocular lens: Secondary | ICD-10-CM

## 2023-08-24 DIAGNOSIS — I10 Essential (primary) hypertension: Secondary | ICD-10-CM

## 2023-08-24 DIAGNOSIS — H35033 Hypertensive retinopathy, bilateral: Secondary | ICD-10-CM

## 2023-08-24 DIAGNOSIS — Z7984 Long term (current) use of oral hypoglycemic drugs: Secondary | ICD-10-CM | POA: Diagnosis not present

## 2023-08-24 DIAGNOSIS — H4313 Vitreous hemorrhage, bilateral: Secondary | ICD-10-CM

## 2023-08-24 DIAGNOSIS — H4312 Vitreous hemorrhage, left eye: Secondary | ICD-10-CM

## 2023-08-24 DIAGNOSIS — H4311 Vitreous hemorrhage, right eye: Secondary | ICD-10-CM

## 2023-08-24 DIAGNOSIS — Z7985 Long-term (current) use of injectable non-insulin antidiabetic drugs: Secondary | ICD-10-CM

## 2023-08-24 DIAGNOSIS — H44001 Unspecified purulent endophthalmitis, right eye: Secondary | ICD-10-CM

## 2023-08-24 DIAGNOSIS — H25812 Combined forms of age-related cataract, left eye: Secondary | ICD-10-CM

## 2023-08-24 MED ORDER — FARICIMAB-SVOA 6 MG/0.05ML IZ SOLN
6.0000 mg | INTRAVITREAL | Status: AC | PRN
  Administered 2023-08-24: 6 mg via INTRAVITREAL

## 2023-08-24 MED ORDER — AFLIBERCEPT 8 MG/0.07ML IZ SOLN
8.0000 mg | INTRAVITREAL | Status: AC | PRN
  Administered 2023-08-24: 8 mg via INTRAVITREAL

## 2023-09-14 ENCOUNTER — Other Ambulatory Visit: Payer: Self-pay | Admitting: Family Medicine

## 2023-09-24 NOTE — Progress Notes (Addendum)
 [facility] - Clinic Note  09/28/2023     CHIEF COMPLAINT Patient presents for Retina Follow Up  HISTORY OF PRESENT ILLNESS: Gloria Gomez is a 53 y.o. female who presents to the clinic today for:  HPI     Retina Follow Up   Patient presents with  Diabetic Retinopathy.  In both eyes.  This started 5 weeks ago.  I, the attending physician,  performed the HPI with the patient and updated documentation appropriately.        Comments   Patient here for 5 weeks retina follow up for PDR OU. Patient states vision doing good. About the same. No eye pain.       Last edited by Valdemar Rogue, MD on 10/03/2023  1:39 AM.      Patient states  Referring physician:   HISTORICAL INFORMATION:   Selected notes from the MEDICAL RECORD NUMBER Referred for DM exam   CURRENT MEDICATIONS: No current outpatient medications on file. (Ophthalmic Drugs)   No current facility-administered medications for this visit. (Ophthalmic Drugs)   Current Outpatient Medications (Other)  Medication Sig   Continuous Blood Gluc Sensor (FREESTYLE LIBRE 3 SENSOR) MISC by Does not apply route every 14 (fourteen) days.   FARXIGA 10 MG TABS tablet Take 10 mg by mouth daily.   furosemide  (LASIX ) 20 MG tablet TAKE 2 TABLETS BY MOUTH IN THE MORNING AND 1 IN THE EVENING   gabapentin  (NEURONTIN ) 300 MG capsule TAKE 1 CAPSULE BY MOUTH THREE TIMES DAILY   HYDROcodone -acetaminophen  (NORCO/VICODIN) 5-325 MG tablet Take 1-2 tablets by mouth every 6 (six) hours as needed for moderate pain (pain score 4-6).   losartan  (COZAAR ) 100 MG tablet Take 1 tablet (100 mg total) by mouth daily.   meloxicam  (MOBIC ) 15 MG tablet Take 1 tablet daily as needed.   metFORMIN  (GLUCOPHAGE ) 1000 MG tablet Take 1 tablet (1,000 mg total) by mouth 2 (two) times daily with a meal.   metoCLOPramide  (REGLAN ) 5 MG tablet TAKE 1 TABLET BY MOUTH 4 TIMES DAILY BEFORE MEAL(S) AND AT BEDTIME   metoprolol  succinate (TOPROL -XL) 100 MG  24 hr tablet Take 2 tablets by mouth once daily   MOUNJARO 12.5 MG/0.5ML Pen Inject 12.5 mg into the skin once a week.   Multiple Vitamin (MULTIVITAMIN WITH MINERALS) TABS tablet Take 1 tablet by mouth daily.   NOVOLOG FLEXPEN 100 UNIT/ML FlexPen    pantoprazole  (PROTONIX ) 40 MG tablet Take 1 tablet (40 mg total) by mouth daily.   promethazine  (PHENERGAN ) 12.5 MG tablet TAKE 1 TO 2 TABLETS BY MOUTH EVERY 6 HOURS AS NEEDED FOR NAUSEA   verapamil  (CALAN -SR) 240 MG CR tablet Take by mouth.   verapamil  (CALAN -SR) 240 MG CR tablet Take 1 tablet (240 mg total) by mouth at bedtime.   amLODipine  (NORVASC ) 5 MG tablet Take 1 tablet (5 mg total) by mouth daily. (Patient not taking: Reported on 09/28/2023)   HUMALOG KWIKPEN 100 UNIT/ML KwikPen Inject into the skin. (Patient not taking: Reported on 09/28/2023)   insulin aspart (NOVOLOG) 100 UNIT/ML injection Inject 10 Units into the skin 2 (two) times daily with a meal. Per endocrinologist (Patient not taking: Reported on 09/28/2023)   Insulin Glargine  (BASAGLAR KWIKPEN) 100 UNIT/ML 10 units every morning and 24 units nightly (Patient not taking: Reported on 09/28/2023)   rosuvastatin  (CRESTOR ) 40 MG tablet Take 1 tablet (40 mg total) by mouth daily. (Patient not taking: Reported on 09/28/2023)   No current facility-administered medications for this visit. (Other)  REVIEW OF SYSTEMS: ROS   Positive for: Endocrine, Eyes Negative for: Constitutional, Gastrointestinal, Neurological, Skin, Genitourinary, Musculoskeletal, HENT, Cardiovascular, Respiratory, Psychiatric, Allergic/Imm, Heme/Lymph Last edited by Orval Asberry RAMAN, COA on 09/28/2023  1:15 PM.       ALLERGIES Allergies  Allergen Reactions   Gluten Meal Swelling   Lisinopril  Cough   Pioglitazone  Other (See Comments)    ELEVATED glucoses + worse chronic nausea   PAST MEDICAL HISTORY Past Medical History:  Diagnosis Date   Abdominal bloating    likely from diab gastroparesis.  Dr. Leigh started  trial of reglan  03/2019.   Benign brain tumor (HCC)    Cystic lesion in cerebral aqueduct region with mild hydrocephalus-- stable MRI 02/2016.  Surveillance MRI 05/2017 --dilated cerebral aqueduct related to aqueductal stenosis and subsequent mild hydrocephalus (due to the 11 mm stable cystic lesion in cerebral aqueduct---?congenitial?.   Bilateral lower extremity edema    Cataract    OU   Dysmenorrhea    vicodin occ during first 2 days of cycle.   Fibromyalgia    Fibula fracture 07/2023   distal, nondisplaced (Right)   Gluten intolerance    pt reports she underwent full GI w/u to r/o celiac dz   Hepatic steatosis    ultrasound 08/2017. Hx of very mild elevation of ALT.  Stable on u/s 06/2020   History of adenomatous polyp of colon 04/08/2019   recall Feb 2024   Hyperlipidemia, mixed    Hypertension    +white coat component   Hypertensive retinopathy of both eyes    Insomnia    Iron deficiency 01/2019   Hb 11.3. Hemoccults neg x 3 03/19/19. EGD and colonoscopy 04/08/19 showed NO cause for IDA.  Pt does have menorrhagia, though, so she'll see her GYN.  started FeSO4 325 qd approx 04/14/19.   Menorrhagia    resulting in IDA 2021   Orthostatic hypotension    PMR (polymyalgia rheumatica) (HCC) 2022   question of; hx of elevated ESR-->better with prednisone  but prednisone  was contraindicated d/t eye issues/hyperglycemia-->rheum started following her 02/2021->plaquenil 04/2021   Proliferative diabetic retinopathy of both eyes (HCC)    steroid injections 10/2017--improved   Sensorineural hearing loss of left ear    Sudden left hearing loss summer 2016--no improvement with steroids 01/2015 so brain MRI done by Dr. Carlie and it showed brain tumor that was determined to be benign.  Pt's hearing not bad enough for hearing aid as of 06/2016.   Type 2 diabetes with complication (HCC)    +microalbuminuria, diab retpthy, diabetic gastroparesis (gastric emptying study mildly abnl 03/2017).  Recommended lantus   08/2018 but pt declined. Mild microalbuminuria.   Uterine fibroid 2022   per pt report, pelvic u/s in GYN office   Past Surgical History:  Procedure Laterality Date   ANOSCOPY  05/12/2019   Procedure: normal exam, minimal hemorrhoid disease. Hyertrophied anal papila, benign appearing, posterior midline. Surgeon: Bernarda Ned MD   CHOLECYSTECTOMY  2000   COLONOSCOPY  04/08/2019   5 adenomas, recall 3 yrs; no cause for IDA found.  Hypertrophied anal papillae->bx showed low grade dysplasia; GI referred her to colorectal surgeon.   ESOPHAGOGASTRODUODENOSCOPY  04/08/2019   mild chronic reactive gastritis. H pylori NEG.  No cause for IDA found.   GAS INSERTION Right 10/31/2018   Procedure: Insertion Of C3F8 Gas;  Surgeon: Valdemar Rogue, MD;  Location: [facility] OR;  Service: Ophthalmology;  Laterality: Right;   GASTRIC EMPTYING SCAN  04/20/2017   Mildly abnormal, particularly the 1st hour  of emptying.   MEMBRANE PEEL Right 10/31/2018   Procedure: MEMBRANE PEEL;  Surgeon: Valdemar Rogue, MD;  Location: [facility] OR;  Service: Ophthalmology;  Laterality: Right;   PARS PLANA VITRECTOMY Right 04/12/2018   Procedure: Right PARS PLANA VITRECTOMY WITH 25 GAUGE with intravitreal antibiotics;  Surgeon: Valdemar Rogue, MD;  Location: [facility] OR;  Service: Ophthalmology;  Laterality: Right;   PARS PLANA VITRECTOMY Right 10/31/2018   Procedure: PARS PLANA VITRECTOMY WITH 25 GAUGE;  Surgeon: Valdemar Rogue, MD;  Location: [facility] OR;  Service: Ophthalmology;  Laterality: Right;   PHOTOCOAGULATION WITH LASER Right 10/31/2018   Procedure: Photocoagulation With Laser;  Surgeon: Valdemar Rogue, MD;  Location: [facility] OR;  Service: Ophthalmology;  Laterality: Right;   TRANSTHORACIC ECHOCARDIOGRAM  08/23/2020   Grd I DD, o/w normal.   FAMILY HISTORY Family History  Problem Relation Age of Onset   Brain cancer Mother    Diabetes Father    Diabetes Maternal Grandmother    Cataracts Maternal Grandmother    Cervical cancer Paternal  Grandmother    Colon cancer Maternal Grandfather 75   Amblyopia Neg Hx    Blindness Neg Hx    Glaucoma Neg Hx    Macular degeneration Neg Hx    Retinal detachment Neg Hx    Strabismus Neg Hx    Retinitis pigmentosa Neg Hx    Esophageal cancer Neg Hx    Stomach cancer Neg Hx    Rectal cancer Neg Hx    SOCIAL HISTORY Social History   Tobacco Use   Smoking status: Never   Smokeless tobacco: Never  Vaping Use   Vaping status: Never Used  Substance Use Topics   Alcohol use: No   Drug use: No       OPHTHALMIC EXAM:  Base Eye Exam     Visual Acuity (Snellen - Linear)       Right Left   Dist cc 20/25 +2 20/20 -1    Correction: Glasses         Tonometry (Tonopen, 1:13 PM)       Right Left   Pressure 18 15         Pupils       Dark Light Shape React APD   Right 3 2 Round Brisk None   Left 3 2 Round Brisk None         Visual Fields (Counting fingers)       Left Right    Full Full         Extraocular Movement       Right Left    Full, Ortho Full, Ortho         Neuro/Psych     Oriented x3: Yes   Mood/Affect: Normal         Dilation     Both eyes: 1.0% Mydriacyl , 2.5% Phenylephrine  @ 1:13 PM           Slit Lamp and Fundus Exam     Slit Lamp Exam       Right Left   Lids/Lashes Dermatochalasis - upper lid, mild Meibomian gland dysfunction, Telangiectasia Dermatochalasis - upper lid, Meibomian gland dysfunction, Telangiectasia   Conjunctiva/Sclera White and quiet White and quiet   Cornea Clear, well healed temporal cataract wounds Trace Punctate epithelial erosions, trace EBMD   Anterior Chamber Deep and quiet Deep and quiet   Iris Round and dilated, No NVI Round and dilated, No NVI   Lens PC IOL in good position, 1-2+ Posterior capsular opacification, PC folds  2-3+ Nuclear sclerosis with mild brunescence, 2-3+ Cortical cataract, 1+ Posterior subcapsular cataract   Anterior Vitreous post vitrectomy, trace pigment Vitreous syneresis,  vitreous condensations, no frank red heme, old white VH clearing and settled inferiorly -- improved         Fundus Exam       Right Left   Disc mild Pallor, Sharp rim, temporal PPA Pink and sharp, Compact, PPA   C/D Ratio 0.2 0.1   Macula Flat, good foveal reflex, mild cystic changes centrally, focal MA and exudates ST mac -- improved, +focal laser changes Flat, good foveal reflex, trace cystic changes--improved, scattered MA; trace ERM, light focal laser changes   Vessels attenuated, Tortuous attenuated, Tortuous   Periphery Attached, rare MA, scattered DBH greatest posteriorly, 360 PRP, good laser fill in 360 attached, scattered IRH; 360 PRP scars -- with good fill in changes           Refraction     Wearing Rx       Sphere Cylinder   Right -0.25 Sphere   Left -4.50 Sphere           IMAGING AND PROCEDURES  Imaging and Procedures for   OCT, Retina - OU - Both Eyes       Right Eye Quality was good. Central Foveal Thickness: 321. Progression has been stable. Findings include no SRF, abnormal foveal contour, intraretinal hyper-reflective material, epiretinal membrane, intraretinal fluid (Persistent IRF/cystic changes temporal fovea.).   Left Eye Quality was good. Central Foveal Thickness: 269. Progression has improved. Findings include normal foveal contour, no IRF, no SRF, intraretinal hyper-reflective material (Interval improvement in IRF/cystic changes temporal fovea, stable improvement in vitreous opacities).   Notes *Images captured and stored on drive  Diagnosis / Impression:  +DME OU OD: Persistent IRF/cystic changes temporal fovea. OS: Interval improvement in IRF/cystic changes temporal fovea,  stable improvement in vitreous opacities  Clinical management:  See below  Abbreviations: NFP - Normal foveal profile. CME - cystoid macular edema. PED - pigment epithelial detachment. IRF - intraretinal fluid. SRF - subretinal fluid. EZ - ellipsoid zone. ERM -  epiretinal membrane. ORA - outer retinal atrophy. ORT - outer retinal tubulation. SRHM - subretinal hyper-reflective material       Intravitreal Injection, Pharmacologic Agent - OD - Right Eye       Time Out 09/28/2023. 2:10 PM. Confirmed correct patient, procedure, site, and patient consented.   Anesthesia Topical anesthesia was used. Anesthetic medications included Lidocaine  2%, Proparacaine  0.5%.   Procedure Preparation included 5% betadine to ocular surface, eyelid speculum. A (32g) needle was used.   Injection: 8 mg aflibercept  8 MG/0.07ML (Patient supplied)   Route: Intravitreal, Site: Right Eye   NDC: R7037034, Lot: [id number], Expiration date: 01/27/2024, Waste: 0 mL   Post-op Post injection exam found visual acuity of at least counting fingers. The patient tolerated the procedure well. There were no complications. The patient received written and verbal post procedure care education.   Notes **SAMPLE MEDICATION ADMINISTERED**            ASSESSMENT/PLAN:   ICD-10-CM   1. Proliferative diabetic retinopathy of both eyes with macular edema associated with type 2 diabetes mellitus (HCC)  E11.3513 OCT, Retina - OU - Both Eyes    Intravitreal Injection, Pharmacologic Agent - OD - Right Eye    aflibercept  (EYLEA  HD) ophthalmic injection 8 mg    CANCELED: Intravitreal Injection, Pharmacologic Agent - OS - Left Eye    2. Current use of insulin (HCC)  Z79.4     3. Long term (current) use of oral hypoglycemic drugs  Z79.84     4. Long-term (current) use of injectable non-insulin antidiabetic drugs  Z79.85     5. Vitreous hemorrhage of left eye (HCC)  H43.12     6. Vitreous hemorrhage, right eye (HCC)  H43.11     7. Hypertensive retinopathy of both eyes  H35.033     8. Essential hypertension  I10     9. Right endophthalmia  H44.001     10. Combined forms of age-related cataract of left eye  H25.812     11. Pseudophakia of right eye  Z96.1       1-4.   Proliferative diabetic retinopathy w/ DME, OU - HbA1c 7.9 (10.08.24),8.6 (06.11.24), 7.1 (3.23.23), 8.6 (9.14.22), 7.8 (06.13.22) - s/p IVA OD #1 9.20.19, #2 (10.25.19), #3 (11.15.19), #4 (12.17.19), #5 (01.14.20), #6 (2.11.20), #7 (05.29.20), #8 (08.19.20), #9 (10.30.20), #10 (12.09.20), #11 (01.11.21), #12 (02.15.21), #13 (03.23.21), #14 (04.20.21), #15 (06.08.21), #16 (07.06.21) -- IVA resistance - s/p IVA OS #1 9.27.19, #2 (10.25.19), #3 (11.15.19), #4 (12.17.19), #5 (01.14.20), #6 (2.11.20), #7 (04.26.20), #8 (05.29.20), #9 (06.26.20), #10 (08.05.20), #11 (11.11.20), #12 (12.09.20), #13 (01.11.21), #14 (02.15.21), #15 (03.23.21), #16 (04.20.21), #17 (06.08.21) -- IVA resistance, #18 (09.27.21) ===================== - s/p IVE OD #1 (08.03.21) -- sample, #2 (09.10.21), #3 (10.08.21), #4 (11.23.21), #5 (12.21.21), #6 (01.19.22) sample, #7 (2.16.22), #8 (3.22.22), #9 (04.19.22), #10 (05.27.22), #11 (06.29.22), #12 (08.03.22), #13 (09.07.22), #14 (10.10.22), #15 (11.14.22), #16 (12.12.22), #17 (01.10.23) - sample, #18 (02.07.23), #19 (03.21.23), #20 (04.18.23), #21 (05.16.23) -- IVE resistance ===================== - s/p IVE OS #1 (10.25.21), #2 (11.23.21), #3 (12.21.21), #4 (3.22.22), #5 (06.29.22), #6 (08.03.22), #7 (09.07.22), #8 (10.10.22), #9 (11.14.22), #10 (02.07.23), #11 (05.16.23), #12 (08.15.23), #13 (11.14.23), #14 (02.06.24), #15 (05.06.24), #16 SAMPLE (07.01.24), #17 (09.26.24), #18 (12.20.24) #19 (03.14.25) ===================== - IVV OS #1 (PAP med-06.27.25)~q35months. ===================== - IVV OD #1 (06.16.23 -- sample), #2 (07.17.23), #3 (08.15.23), #4 (09.12.23), #5 (10.10.23), #6 (11.14.23), #7 (12.12.23), #8 (01.09.23), #9 (02.06.24), #10 (03.11.24), #11 (04.08.24), #12 (05.06.24), #13 (06.03.24), #14 (07.01.24), #15 (07.29.24), #16 (08.26.24)- IVV resistance ====================== - s/p IVE HD OD #1 (sample 09.26.24), #2 (10.25.24), #3 (11.21.24), #4 (12.20.24), #5 (01.17.25 --  sample), #6 (02.14.25 sample), #7 (03.14.25 -- sample)  #9 (04.11.25 -- sample) #10 (05.23.25 - sample), #11 (06.27.25 -- SAMPLE)  - S/P PRP OS (09.20.19), (5.19.20), (08.19.20), (04.28.21), (02.02.22) - S/P PRP OD (9.27.19 and 11.21.19), fill-in (04.14.20) (09.03.20, surgery)  - S/P focal laser OS (07.06.21), OD (09.20.22) - FA (9.20.19) shows +NVE OU and leaking MA and capillary nonperfusion  - repeat FA 11.15.19 shows NV regressing OU - pre-op: OD w/ VA down at 20/25, but there is some preretinal fibrosis / tractional membranes just superior to disc and mild central DME - s/p 25g PPV+MP+10% C3F8 gas OD (09.03.20) -- ERM/PRF removal OD  - BCVA: OD 20/25 (stable) OS 20/20 (stable)             - fibrosis/ERM stably improved; retina attached - OCT shows OD: Persistent IRF/cystic changes temporal fovea; OS: Interval improvement in IRF/cystic changes temporal fovea,  stable improvement in vitreous opacities at 5 wks - recommend IVE HD OD #12 (SAMPLE) and will hold IVV OS (PAP) today, 08.01.25 with follow up in 5 weeks again - [employer] funding unavailable **OS on ~q3m maintenance treatment schedule -- due again ~September**  - RBA of procedure discussed, questions answered - see procedure note  - Eylea  informed  consent form re-signed and scanned on 05.16.2023  - Vabysmo  informed consent form signed on 06.16.23 - Eylea  HD informed consent obtained, resigned, and scanned on 10.25.24  - Eylea  HD approved for 2024 -- but no funding for [employer]  - f/u 5 weeks, DFE, OCT, possible injection(s)  5. Vitreous hemorrhage OS -- stably improved  - recurrent VH, onset 09.22.21  - etiology: secondary to PDR as described above (no RT/RD on exam)  - s/p PRP OS (9.20.19), (05.19.20), (08.19.20), (04.28.21), (02.02.22) - s/p IVA OS on 4.26.20, 5.29.20, 6.26.20, 8.5.20,11.11.20, 12.06.20 and so on as above   - Vit condensations stably improved  - BCVA stable at 20/25  - IVEs as above  - f/u 5 weeks DFE,  OCT  6. History of Endophthalmitis OD  - s/p IVA OU 04/09/2018  - s/p 25g PPV w/ intravitreal vanc, ceftaz and cefepime  OD, 2.14.2020  - s/p intravitreal tap / vanc and ceflaz injections (02.16.20)             - gram stain (2.14.20) shows G+ cocci, WBCs mostly PMNs;   - repeat gram stain from t/i (2.16.20) -- no organisms, just WBCs  - cultures from vitreous grew rare Staph warneri; cultures from t/i -- no growth             - doing well, BCVA 20/25             - inflammation/posterior debris resolved  - monitor  7. History of Vitreous Hemorrhage OD -- cleared from PPV x2 for endophthalmitis and ERM/preretinal fibrosis   - secondary to PDR  8,9. Hypertensive retinopathy OU  - discussed importance of tight BP control.  - monitor  10. Combined form age related cataract OS - The symptoms of cataract, surgical options, and treatments and risks were discussed with patient.   - discussed diagnosis and progression  11. Pseudophakia OD  - s/p CE/IOL OD (Dr. CANDIE Gaudy, 12.11.20)  - beautiful surgery, doing well  Ophthalmic Meds Ordered this visit:  Meds ordered this encounter  Medications   aflibercept  (EYLEA  HD) ophthalmic injection 8 mg     Return in about 5 weeks (around 11/02/2023) for PDR OU, DFE, OCT, Possible Injxn.  There are no Patient Instructions on file for this visit.  This document serves as a record of services personally performed by Redell JUDITHANN Hans, MD, PhD. It was created on their behalf by Avelina Pereyra, COA an ophthalmic technician. The creation of this record is the provider's dictation and/or activities during the visit.   Electronically signed by: Avelina GORMAN Pereyra, COT  10/03/23  1:49 AM    This document serves as a record of services personally performed by Redell JUDITHANN Hans, MD, PhD. It was created on their behalf by Almetta Pesa, an ophthalmic technician. The creation of this record is the provider's dictation and/or activities during the visit.     Electronically signed by: Almetta Pesa, OA, 10/03/23  1:49 AM    Redell JUDITHANN Hans, M.D., Ph.D. Diseases & Surgery of the Retina and Vitreous [facility]  I have reviewed the above documentation for accuracy and completeness, and I agree with the above. Redell JUDITHANN Hans, M.D., Ph.D. 10/03/23 1:50 AM    Abbreviations: M myopia (nearsighted); A astigmatism; H hyperopia (farsighted); P presbyopia; Mrx spectacle prescription;  CTL contact lenses; OD right eye; OS left eye; OU both eyes  XT exotropia; ET esotropia; PEK punctate epithelial keratitis; PEE punctate epithelial erosions; DES dry eye  syndrome; MGD meibomian gland dysfunction; ATs artificial tears; PFAT's preservative free artificial tears; NSC nuclear sclerotic cataract; PSC posterior subcapsular cataract; ERM epi-retinal membrane; PVD posterior vitreous detachment; RD retinal detachment; DM diabetes mellitus; DR diabetic retinopathy; NPDR non-proliferative diabetic retinopathy; PDR proliferative diabetic retinopathy; CSME clinically significant macular edema; DME diabetic macular edema; dbh dot blot hemorrhages; CWS cotton wool spot; POAG primary open angle glaucoma; C/D cup-to-disc ratio; HVF humphrey visual field; GVF goldmann visual field; OCT optical coherence tomography; IOP intraocular pressure; BRVO Branch retinal vein occlusion; CRVO central retinal vein occlusion; CRAO central retinal artery occlusion; BRAO branch retinal artery occlusion; RT retinal tear; SB scleral buckle; PPV pars plana vitrectomy; VH Vitreous hemorrhage; PRP panretinal laser photocoagulation; IVK intravitreal kenalog ; VMT vitreomacular traction; MH Macular hole;  NVD neovascularization of the disc; NVE neovascularization elsewhere; AREDS age related eye disease study; ARMD age related macular degeneration; POAG primary open angle glaucoma; EBMD epithelial/anterior basement membrane dystrophy; ACIOL anterior chamber intraocular lens; IOL  intraocular lens; PCIOL posterior chamber intraocular lens; Phaco/IOL phacoemulsification with intraocular lens placement; PRK photorefractive keratectomy; LASIK laser assisted in situ keratomileusis; HTN hypertension; DM diabetes mellitus; COPD chronic obstructive pulmonary disease

## 2023-09-26 ENCOUNTER — Other Ambulatory Visit: Payer: Self-pay | Admitting: Family Medicine

## 2023-09-26 MED ORDER — HYDROCODONE-ACETAMINOPHEN 5-325 MG PO TABS: 1.0000 | ORAL_TABLET | Freq: Four times a day (QID) | ORAL | 0 refills | Status: DC | PRN

## 2023-09-26 NOTE — Telephone Encounter (Signed)
 Copied from CRM [id number]. Topic: Clinical - Medication Refill >> Sep 26, 2023  8:35 AM Viola F wrote: Medication: HYDROcodone -acetaminophen  (NORCO/VICODIN) 5-325 MG tablet [[id number]]  Has the patient contacted their pharmacy? Yes (Agent: If no, request that the patient contact the pharmacy for the refill. If patient does not wish to contact the pharmacy document the reason why and proceed with request.) (Agent: If yes, when and what did the pharmacy advise?)  This is the patient's preferred pharmacy:  [facility] [address], [place] - [address] [place] [address] Phone: [phone] Fax: [phone]  Is this the correct pharmacy for this prescription? Yes If no, delete pharmacy and type the correct one.   Has the prescription been filled recently? Yes  Is the patient out of the medication? No, patient has 2 tablets left   Has the patient been seen for an appointment in the last year OR does the patient have an upcoming appointment? Yes  Can we respond through MyChart? Yes  Agent: Please be advised that Rx refills may take up to 3 business days. We ask that you follow-up with your pharmacy.

## 2023-09-28 ENCOUNTER — Ambulatory Visit (INDEPENDENT_AMBULATORY_CARE_PROVIDER_SITE_OTHER): Admitting: Ophthalmology

## 2023-09-28 ENCOUNTER — Encounter (INDEPENDENT_AMBULATORY_CARE_PROVIDER_SITE_OTHER): Payer: Self-pay | Admitting: Ophthalmology

## 2023-09-28 DIAGNOSIS — H44001 Unspecified purulent endophthalmitis, right eye: Secondary | ICD-10-CM

## 2023-09-28 DIAGNOSIS — H35033 Hypertensive retinopathy, bilateral: Secondary | ICD-10-CM

## 2023-09-28 DIAGNOSIS — H4312 Vitreous hemorrhage, left eye: Secondary | ICD-10-CM

## 2023-09-28 DIAGNOSIS — Z961 Presence of intraocular lens: Secondary | ICD-10-CM

## 2023-09-28 DIAGNOSIS — Z7984 Long term (current) use of oral hypoglycemic drugs: Secondary | ICD-10-CM | POA: Diagnosis not present

## 2023-09-28 DIAGNOSIS — H4313 Vitreous hemorrhage, bilateral: Secondary | ICD-10-CM

## 2023-09-28 DIAGNOSIS — Z7985 Long-term (current) use of injectable non-insulin antidiabetic drugs: Secondary | ICD-10-CM | POA: Diagnosis not present

## 2023-09-28 DIAGNOSIS — H25812 Combined forms of age-related cataract, left eye: Secondary | ICD-10-CM

## 2023-09-28 DIAGNOSIS — Z794 Long term (current) use of insulin: Secondary | ICD-10-CM

## 2023-09-28 DIAGNOSIS — E113513 Type 2 diabetes mellitus with proliferative diabetic retinopathy with macular edema, bilateral: Secondary | ICD-10-CM | POA: Diagnosis not present

## 2023-09-28 DIAGNOSIS — H4311 Vitreous hemorrhage, right eye: Secondary | ICD-10-CM

## 2023-09-28 DIAGNOSIS — I10 Essential (primary) hypertension: Secondary | ICD-10-CM

## 2023-10-02 DIAGNOSIS — M353 Polymyalgia rheumatica: Secondary | ICD-10-CM | POA: Diagnosis not present

## 2023-10-02 DIAGNOSIS — R21 Rash and other nonspecific skin eruption: Secondary | ICD-10-CM | POA: Diagnosis not present

## 2023-10-02 DIAGNOSIS — R700 Elevated erythrocyte sedimentation rate: Secondary | ICD-10-CM | POA: Diagnosis not present

## 2023-10-02 DIAGNOSIS — M797 Fibromyalgia: Secondary | ICD-10-CM | POA: Diagnosis not present

## 2023-10-03 ENCOUNTER — Encounter (INDEPENDENT_AMBULATORY_CARE_PROVIDER_SITE_OTHER): Payer: Self-pay | Admitting: Ophthalmology

## 2023-10-03 MED ORDER — AFLIBERCEPT 8 MG/0.07ML IZ SOLN
8.0000 mg | INTRAVITREAL | Status: AC | PRN
  Administered 2023-10-03: 8 mg via INTRAVITREAL

## 2023-10-10 DIAGNOSIS — Z01419 Encounter for gynecological examination (general) (routine) without abnormal findings: Secondary | ICD-10-CM | POA: Diagnosis not present

## 2023-10-10 DIAGNOSIS — Z124 Encounter for screening for malignant neoplasm of cervix: Secondary | ICD-10-CM | POA: Diagnosis not present

## 2023-10-10 DIAGNOSIS — Z1151 Encounter for screening for human papillomavirus (HPV): Secondary | ICD-10-CM | POA: Diagnosis not present

## 2023-10-10 DIAGNOSIS — L292 Pruritus vulvae: Secondary | ICD-10-CM | POA: Diagnosis not present

## 2023-10-30 DIAGNOSIS — D251 Intramural leiomyoma of uterus: Secondary | ICD-10-CM | POA: Diagnosis not present

## 2023-10-31 NOTE — Progress Notes (Signed)
 [facility] - Clinic Note  11/02/2023     CHIEF COMPLAINT Patient presents for Retina Follow Up  HISTORY OF PRESENT ILLNESS: Gloria Gomez is a 53 y.o. female who presents to the clinic today for:  HPI     Retina Follow Up   Patient presents with  Diabetic Retinopathy.  In both eyes.  This started 5 weeks ago.  I, the attending physician,  performed the HPI with the patient and updated documentation appropriately.        Comments   Patient here for 5 weeks retina follow up for PDR OU. Patient states vision is ok. OD can tell when medicine is wearing down. No eye pain.      Last edited by Valdemar Rogue, MD on 11/03/2023  2:32 AM.    Pt states she had a fall, fractured right foot. VA is stable.   Referring physician:   HISTORICAL INFORMATION:   Selected notes from the MEDICAL RECORD NUMBER Referred for DM exam   CURRENT MEDICATIONS: No current outpatient medications on file. (Ophthalmic Drugs)   No current facility-administered medications for this visit. (Ophthalmic Drugs)   Current Outpatient Medications (Other)  Medication Sig   Continuous Blood Gluc Sensor (FREESTYLE LIBRE 3 SENSOR) MISC by Does not apply route every 14 (fourteen) days.   FARXIGA 10 MG TABS tablet Take 10 mg by mouth daily.   furosemide  (LASIX ) 20 MG tablet TAKE 2 TABLETS BY MOUTH IN THE MORNING AND 1 IN THE EVENING   gabapentin  (NEURONTIN ) 300 MG capsule TAKE 1 CAPSULE BY MOUTH THREE TIMES DAILY   HYDROcodone -acetaminophen  (NORCO/VICODIN) 5-325 MG tablet Take 1-2 tablets by mouth every 6 (six) hours as needed for moderate pain (pain score 4-6).   losartan  (COZAAR ) 100 MG tablet Take 1 tablet (100 mg total) by mouth daily.   meloxicam  (MOBIC ) 15 MG tablet Take 1 tablet daily as needed.   metFORMIN  (GLUCOPHAGE ) 1000 MG tablet Take 1 tablet (1,000 mg total) by mouth 2 (two) times daily with a meal.   metoCLOPramide  (REGLAN ) 5 MG tablet TAKE 1 TABLET BY MOUTH 4 TIMES DAILY BEFORE  MEAL(S) AND AT BEDTIME   metoprolol  succinate (TOPROL -XL) 100 MG 24 hr tablet Take 2 tablets by mouth once daily   MOUNJARO 12.5 MG/0.5ML Pen Inject 12.5 mg into the skin once a week.   Multiple Vitamin (MULTIVITAMIN WITH MINERALS) TABS tablet Take 1 tablet by mouth daily.   NOVOLOG FLEXPEN 100 UNIT/ML FlexPen    pantoprazole  (PROTONIX ) 40 MG tablet Take 1 tablet (40 mg total) by mouth daily.   promethazine  (PHENERGAN ) 12.5 MG tablet TAKE 1 TO 2 TABLETS BY MOUTH EVERY 6 HOURS AS NEEDED FOR NAUSEA   verapamil  (CALAN -SR) 240 MG CR tablet Take by mouth.   verapamil  (CALAN -SR) 240 MG CR tablet Take 1 tablet (240 mg total) by mouth at bedtime.   amLODipine  (NORVASC ) 5 MG tablet Take 1 tablet (5 mg total) by mouth daily. (Patient not taking: Reported on 11/02/2023)   HUMALOG KWIKPEN 100 UNIT/ML KwikPen Inject into the skin. (Patient not taking: Reported on 11/02/2023)   insulin aspart (NOVOLOG) 100 UNIT/ML injection Inject 10 Units into the skin 2 (two) times daily with a meal. Per endocrinologist (Patient not taking: Reported on 11/02/2023)   Insulin Glargine  (BASAGLAR KWIKPEN) 100 UNIT/ML 10 units every morning and 24 units nightly (Patient not taking: Reported on 11/02/2023)   rosuvastatin  (CRESTOR ) 40 MG tablet Take 1 tablet (40 mg total) by mouth daily. (Patient not taking:  Reported on 11/02/2023)   No current facility-administered medications for this visit. (Other)   REVIEW OF SYSTEMS: ROS   Positive for: Endocrine, Eyes Negative for: Constitutional, Gastrointestinal, Neurological, Skin, Genitourinary, Musculoskeletal, HENT, Cardiovascular, Respiratory, Psychiatric, Allergic/Imm, Heme/Lymph Last edited by Orval Asberry RAMAN, COA on 11/02/2023  1:02 PM.     ALLERGIES Allergies  Allergen Reactions   Gluten Meal Swelling   Lisinopril  Cough   Pioglitazone  Other (See Comments)    ELEVATED glucoses + worse chronic nausea   PAST MEDICAL HISTORY Past Medical History:  Diagnosis Date   Abdominal  bloating    likely from diab gastroparesis.  Dr. Leigh started trial of reglan  03/2019.   Benign brain tumor (HCC)    Cystic lesion in cerebral aqueduct region with mild hydrocephalus-- stable MRI 02/2016.  Surveillance MRI 05/2017 --dilated cerebral aqueduct related to aqueductal stenosis and subsequent mild hydrocephalus (due to the 11 mm stable cystic lesion in cerebral aqueduct---?congenitial?.   Bilateral lower extremity edema    Cataract    OU   Dysmenorrhea    vicodin occ during first 2 days of cycle.   Fibromyalgia    Fibula fracture 07/2023   distal, nondisplaced (Right)   Gluten intolerance    pt reports she underwent full GI w/u to r/o celiac dz   Hepatic steatosis    ultrasound 08/2017. Hx of very mild elevation of ALT.  Stable on u/s 06/2020   History of adenomatous polyp of colon 04/08/2019   recall Feb 2024   Hyperlipidemia, mixed    Hypertension    +white coat component   Hypertensive retinopathy of both eyes    Insomnia    Iron deficiency 01/2019   Hb 11.3. Hemoccults neg x 3 03/19/19. EGD and colonoscopy 04/08/19 showed NO cause for IDA.  Pt does have menorrhagia, though, so she'll see her GYN.  started FeSO4 325 qd approx 04/14/19.   Menorrhagia    resulting in IDA 2021   Orthostatic hypotension    PMR (polymyalgia rheumatica) (HCC) 2022   question of; hx of elevated ESR-->better with prednisone  but prednisone  was contraindicated d/t eye issues/hyperglycemia-->rheum started following her 02/2021->plaquenil 04/2021   Proliferative diabetic retinopathy of both eyes (HCC)    steroid injections 10/2017--improved   Sensorineural hearing loss of left ear    Sudden left hearing loss summer 2016--no improvement with steroids 01/2015 so brain MRI done by Dr. Carlie and it showed brain tumor that was determined to be benign.  Pt's hearing not bad enough for hearing aid as of 06/2016.   Type 2 diabetes with complication (HCC)    +microalbuminuria, diab retpthy, diabetic  gastroparesis (gastric emptying study mildly abnl 03/2017).  Recommended lantus  08/2018 but pt declined. Mild microalbuminuria.   Uterine fibroid 2022   per pt report, pelvic u/s in GYN office   Past Surgical History:  Procedure Laterality Date   ANOSCOPY  05/12/2019   Procedure: normal exam, minimal hemorrhoid disease. Hyertrophied anal papila, benign appearing, posterior midline. Surgeon: Bernarda Ned MD   CHOLECYSTECTOMY  2000   COLONOSCOPY  04/08/2019   5 adenomas, recall 3 yrs; no cause for IDA found.  Hypertrophied anal papillae->bx showed low grade dysplasia; GI referred her to colorectal surgeon.   ESOPHAGOGASTRODUODENOSCOPY  04/08/2019   mild chronic reactive gastritis. H pylori NEG.  No cause for IDA found.   GAS INSERTION Right 10/31/2018   Procedure: Insertion Of C3F8 Gas;  Surgeon: Valdemar Rogue, MD;  Location: [facility] OR;  Service: Ophthalmology;  Laterality: Right;  GASTRIC EMPTYING SCAN  04/20/2017   Mildly abnormal, particularly the 1st hour of emptying.   MEMBRANE PEEL Right 10/31/2018   Procedure: MEMBRANE PEEL;  Surgeon: Valdemar Rogue, MD;  Location: [facility] OR;  Service: Ophthalmology;  Laterality: Right;   PARS PLANA VITRECTOMY Right 04/12/2018   Procedure: Right PARS PLANA VITRECTOMY WITH 25 GAUGE with intravitreal antibiotics;  Surgeon: Valdemar Rogue, MD;  Location: [facility] OR;  Service: Ophthalmology;  Laterality: Right;   PARS PLANA VITRECTOMY Right 10/31/2018   Procedure: PARS PLANA VITRECTOMY WITH 25 GAUGE;  Surgeon: Valdemar Rogue, MD;  Location: [facility] OR;  Service: Ophthalmology;  Laterality: Right;   PHOTOCOAGULATION WITH LASER Right 10/31/2018   Procedure: Photocoagulation With Laser;  Surgeon: Valdemar Rogue, MD;  Location: [facility] OR;  Service: Ophthalmology;  Laterality: Right;   TRANSTHORACIC ECHOCARDIOGRAM  08/23/2020   Grd I DD, o/w normal.   FAMILY HISTORY Family History  Problem Relation Age of Onset   Brain cancer Mother    Diabetes Father    Diabetes Maternal  Grandmother    Cataracts Maternal Grandmother    Cervical cancer Paternal Grandmother    Colon cancer Maternal Grandfather 77   Amblyopia Neg Hx    Blindness Neg Hx    Glaucoma Neg Hx    Macular degeneration Neg Hx    Retinal detachment Neg Hx    Strabismus Neg Hx    Retinitis pigmentosa Neg Hx    Esophageal cancer Neg Hx    Stomach cancer Neg Hx    Rectal cancer Neg Hx    SOCIAL HISTORY Social History   Tobacco Use   Smoking status: Never   Smokeless tobacco: Never  Vaping Use   Vaping status: Never Used  Substance Use Topics   Alcohol use: No   Drug use: No       OPHTHALMIC EXAM:  Base Eye Exam     Visual Acuity (Snellen - Linear)       Right Left   Dist cc 20/25 -2 20/25         Tonometry (Tonopen, 1:00 PM)       Right Left   Pressure 14 17         Pupils       Dark Light Shape React APD   Right 3 2 Round Brisk None   Left 3 2 Round Brisk None         Visual Fields (Counting fingers)       Left Right    Full Full         Extraocular Movement       Right Left    Full, Ortho Full, Ortho         Neuro/Psych     Oriented x3: Yes   Mood/Affect: Normal         Dilation     Both eyes: 1.0% Mydriacyl , 2.5% Phenylephrine  @ 1:00 PM           Slit Lamp and Fundus Exam     Slit Lamp Exam       Right Left   Lids/Lashes Dermatochalasis - upper lid, mild Meibomian gland dysfunction, Telangiectasia Dermatochalasis - upper lid, Meibomian gland dysfunction, Telangiectasia   Conjunctiva/Sclera White and quiet White and quiet   Cornea Clear, well healed temporal cataract wounds Trace Punctate epithelial erosions, trace EBMD   Anterior Chamber Deep and quiet Deep and quiet   Iris Round and dilated, No NVI Round and dilated, No NVI   Lens PC IOL in good  position, 1-2+ Posterior capsular opacification, PC folds 2-3+ Nuclear sclerosis with mild brunescence, 2-3+ Cortical cataract, 1+ Posterior subcapsular cataract   Anterior Vitreous  post vitrectomy, trace pigment Vitreous syneresis, vitreous condensations, no frank red heme, old white VH clearing and settled inferiorly -- resolved         Fundus Exam       Right Left   Disc mild Pallor, Sharp rim, temporal PPA Pink and sharp, Compact, PPA   C/D Ratio 0.2 0.1   Macula Flat, good foveal reflex, mild cystic changes centrally--slightly improved, focal MA and exudates ST mac -- improved, +focal laser changes Flat, good foveal reflex, trace cystic changes--improved, scattered MA; trace ERM, light focal laser changes   Vessels attenuated, Tortuous attenuated, Tortuous   Periphery Attached, rare MA, scattered DBH greatest posteriorly, 360 PRP, good laser fill in 360 attached, scattered IRH; 360 PRP scars -- with good fill in changes           Refraction     Wearing Rx       Sphere Cylinder   Right -0.25 Sphere   Left -4.50 Sphere           IMAGING AND PROCEDURES  Imaging and Procedures for   OCT, Retina - OU - Both Eyes       Right Eye Quality was good. Central Foveal Thickness: 310. Progression has improved. Findings include no SRF, abnormal foveal contour, intraretinal hyper-reflective material, epiretinal membrane, intraretinal fluid (Persistent IRF/cystic changes temporal fovea--slightly improved).   Left Eye Quality was good. Central Foveal Thickness: 269. Progression has been stable. Findings include normal foveal contour, no IRF, no SRF, intraretinal hyper-reflective material (Stable improvement in IRF/cystic changes temporal fovea, stable improvement in vitreous opacities).   Notes *Images captured and stored on drive  Diagnosis / Impression:  +DME OU OD: Persistent IRF/cystic changes temporal fovea--slightly improved OS: Stable improvement in IRF/cystic changes temporal fovea,  stable improvement in vitreous opacities  Clinical management:  See below  Abbreviations: NFP - Normal foveal profile. CME - cystoid macular edema. PED - pigment  epithelial detachment. IRF - intraretinal fluid. SRF - subretinal fluid. EZ - ellipsoid zone. ERM - epiretinal membrane. ORA - outer retinal atrophy. ORT - outer retinal tubulation. SRHM - subretinal hyper-reflective material       Intravitreal Injection, Pharmacologic Agent - OD - Right Eye       Time Out 11/02/2023. 1:23 PM. Confirmed correct patient, procedure, site, and patient consented.   Anesthesia Topical anesthesia was used. Anesthetic medications included Lidocaine  2%, Proparacaine  0.5%.   Procedure Preparation included 5% betadine to ocular surface, eyelid speculum. A supplied (32g) needle was used.   Injection: 6 mg faricimab -svoa 6 MG/0.05ML (Patient supplied)   Route: Intravitreal, Site: Right Eye   NDC: 49757-903-93, Lot: [id number], Expiration date: 07/26/2024, Waste: 0 mL   Post-op Post injection exam found visual acuity of at least counting fingers. The patient tolerated the procedure well. There were no complications. The patient received written and verbal post procedure care education.   Notes **PAP MEDICATION ADMINISTERED**           ASSESSMENT/PLAN:   ICD-10-CM   1. Proliferative diabetic retinopathy of both eyes with macular edema associated with type 2 diabetes mellitus (HCC)  E11.3513 OCT, Retina - OU - Both Eyes    Intravitreal Injection, Pharmacologic Agent - OD - Right Eye    faricimab -svoa (VABYSMO ) 6mg /0.32mL intravitreal injection    2. Current use of insulin (HCC)  Z79.4  3. Long term (current) use of oral hypoglycemic drugs  Z79.84     4. Long-term (current) use of injectable non-insulin antidiabetic drugs  Z79.85     5. Vitreous hemorrhage of left eye (HCC)  H43.12     6. Vitreous hemorrhage, right eye (HCC)  H43.11     7. Hypertensive retinopathy of both eyes  H35.033     8. Essential hypertension  I10     9. Right endophthalmia  H44.001     10. Combined forms of age-related cataract of left eye  H25.812     11. Pseudophakia  of right eye  Z96.1      1-4.  Proliferative diabetic retinopathy w/ DME, OU - HbA1c 7.9 (10.08.24),8.6 (06.11.24), 7.1 (3.23.23), 8.6 (9.14.22), 7.8 (06.13.22) - s/p IVA OD #1 9.20.19, #2 (10.25.19), #3 (11.15.19), #4 (12.17.19), #5 (01.14.20), #6 (2.11.20), #7 (05.29.20), #8 (08.19.20), #9 (10.30.20), #10 (12.09.20), #11 (01.11.21), #12 (02.15.21), #13 (03.23.21), #14 (04.20.21), #15 (06.08.21), #16 (07.06.21) -- IVA resistance - s/p IVA OS #1 9.27.19, #2 (10.25.19), #3 (11.15.19), #4 (12.17.19), #5 (01.14.20), #6 (2.11.20), #7 (04.26.20), #8 (05.29.20), #9 (06.26.20), #10 (08.05.20), #11 (11.11.20), #12 (12.09.20), #13 (01.11.21), #14 (02.15.21), #15 (03.23.21), #16 (04.20.21), #17 (06.08.21) -- IVA resistance, #18 (09.27.21) ===================== - s/p IVE OD #1 (08.03.21) -- sample, #2 (09.10.21), #3 (10.08.21), #4 (11.23.21), #5 (12.21.21), #6 (01.19.22) sample, #7 (2.16.22), #8 (3.22.22), #9 (04.19.22), #10 (05.27.22), #11 (06.29.22), #12 (08.03.22), #13 (09.07.22), #14 (10.10.22), #15 (11.14.22), #16 (12.12.22), #17 (01.10.23) - sample, #18 (02.07.23), #19 (03.21.23), #20 (04.18.23), #21 (05.16.23) -- IVE resistance ===================== - s/p IVE OS #1 (10.25.21), #2 (11.23.21), #3 (12.21.21), #4 (3.22.22), #5 (06.29.22), #6 (08.03.22), #7 (09.07.22), #8 (10.10.22), #9 (11.14.22), #10 (02.07.23), #11 (05.16.23), #12 (08.15.23), #13 (11.14.23), #14 (02.06.24), #15 (05.06.24), #16 SAMPLE (07.01.24), #17 (09.26.24), #18 (12.20.24) #19 (03.14.25) ===================== - IVV OS #1 (PAP med-06.27.25)~q12months. ===================== - IVV OD #1 (06.16.23 -- sample), #2 (07.17.23), #3 (08.15.23), #4 (09.12.23), #5 (10.10.23), #6 (11.14.23), #7 (12.12.23), #8 (01.09.23), #9 (02.06.24), #10 (03.11.24), #11 (04.08.24), #12 (05.06.24), #13 (06.03.24), #14 (07.01.24), #15 (07.29.24), #16 (08.26.24)- IVV resistance ====================== - s/p IVE HD OD #1 (sample 09.26.24), #2 (10.25.24), #3 (11.21.24),  #4 (12.20.24), #5 (01.17.25 -- sample), #6 (02.14.25 sample), #7 (03.14.25 -- sample)  #9 (04.11.25 -- sample) #10 (05.23.25 - sample), #11 (06.27.25 -- SAMPLE) #12(08.01.25) sample   - S/P PRP OS (09.20.19), (5.19.20), (08.19.20), (04.28.21), (02.02.22) - S/P PRP OD (9.27.19 and 11.21.19), fill-in (04.14.20) (09.03.20, surgery)  - S/P focal laser OS (07.06.21), OD (09.20.22) - FA (9.20.19) shows +NVE OU and leaking MA and capillary nonperfusion  - repeat FA 11.15.19 shows NV regressing OU - pre-op: OD w/ VA down at 20/25, but there is some preretinal fibrosis / tractional membranes just superior to disc and mild central DME - s/p 25g PPV+MP+10% C3F8 gas OD (09.03.20) -- ERM/PRF removal OD  - BCVA: OD 20/25 (stable) OS 20/25             - fibrosis/ERM stably improved; retina attached - OCT shows OD: Persistent IRF/cystic changes temporal fovea--slightly improved; OS: Stable improvement in IRF/cystic changes temporal fovea,  stable improvement in vitreous opacities at 5 wks - recommend IVV OD #17 (PAP) and will hold IVV OS again today, 09.05.25 with follow up in 5 weeks again - [employer] funding unavailable **OS on ~q92m maintenance treatment schedule -- due again ~October**  - RBA of procedure discussed, questions answered - see procedure note  - Eylea  informed consent form re-signed and  scanned on 05.16.2023  - Vabysmo  informed consent form signed on 06.16.23 - Eylea  HD informed consent obtained, resigned, and scanned on 10.25.24  - Eylea  HD approved for 2024 -- but no funding for [employer]  - f/u 5 weeks, DFE, OCT, possible injection(s)  5. Vitreous hemorrhage OS -- stably improved  - recurrent VH, onset 09.22.21  - etiology: secondary to PDR as described above (no RT/RD on exam)  - s/p PRP OS (9.20.19), (05.19.20), (08.19.20), (04.28.21), (02.02.22) - s/p IVA OS on 4.26.20, 5.29.20, 6.26.20, 8.5.20,11.11.20, 12.06.20 and so on as above   - Vit condensations stably improved  - BCVA stable  at 20/25  - IVEs as above  - f/u 5 weeks DFE, OCT  6. History of Endophthalmitis OD  - s/p IVA OU 04/09/2018  - s/p 25g PPV w/ intravitreal vanc, ceftaz and cefepime  OD, 2.14.2020  - s/p intravitreal tap / vanc and ceflaz injections (02.16.20)             - gram stain (2.14.20) shows G+ cocci, WBCs mostly PMNs;   - repeat gram stain from t/i (2.16.20) -- no organisms, just WBCs  - cultures from vitreous grew rare Staph warneri; cultures from t/i -- no growth             - doing well, BCVA 20/25             - inflammation/posterior debris resolved  - monitor  7. History of Vitreous Hemorrhage OD -- cleared from PPV x2 for endophthalmitis and ERM/preretinal fibrosis   - secondary to PDR  8,9. Hypertensive retinopathy OU  - discussed importance of tight BP control.  - monitor  10. Combined form age related cataract OS - The symptoms of cataract, surgical options, and treatments and risks were discussed with patient.   - discussed diagnosis and progression  11. Pseudophakia OD  - s/p CE/IOL OD (Dr. CANDIE Gaudy, 12.11.20)  - beautiful surgery, doing well  Ophthalmic Meds Ordered this visit:  Meds ordered this encounter  Medications   faricimab -svoa (VABYSMO ) 6mg /0.67mL intravitreal injection     Return in about 5 weeks (around 12/07/2023) for PDR OU, DFE, OCT, Possible Injxn.  There are no Patient Instructions on file for this visit.  This document serves as a record of services personally performed by Redell JUDITHANN Hans, MD, PhD. It was created on their behalf by Delon Newness COT, an ophthalmic technician. The creation of this record is the provider's dictation and/or activities during the visit.    Electronically signed by: Delon Newness COT 09.03.2025 2:34 AM  Redell JUDITHANN Hans, M.D., Ph.D. Diseases & Surgery of the Retina and Vitreous [facility] 11/02/2023   I have reviewed the above documentation for accuracy and completeness, and I agree  with the above. Redell JUDITHANN Hans, M.D., Ph.D. 11/03/23 2:37 AM   Abbreviations: M myopia (nearsighted); A astigmatism; H hyperopia (farsighted); P presbyopia; Mrx spectacle prescription;  CTL contact lenses; OD right eye; OS left eye; OU both eyes  XT exotropia; ET esotropia; PEK punctate epithelial keratitis; PEE punctate epithelial erosions; DES dry eye syndrome; MGD meibomian gland dysfunction; ATs artificial tears; PFAT's preservative free artificial tears; NSC nuclear sclerotic cataract; PSC posterior subcapsular cataract; ERM epi-retinal membrane; PVD posterior vitreous detachment; RD retinal detachment; DM diabetes mellitus; DR diabetic retinopathy; NPDR non-proliferative diabetic retinopathy; PDR proliferative diabetic retinopathy; CSME clinically significant macular edema; DME diabetic macular edema; dbh dot blot hemorrhages; CWS cotton wool spot; POAG primary open angle glaucoma;  C/D cup-to-disc ratio; HVF humphrey visual field; GVF goldmann visual field; OCT optical coherence tomography; IOP intraocular pressure; BRVO Branch retinal vein occlusion; CRVO central retinal vein occlusion; CRAO central retinal artery occlusion; BRAO branch retinal artery occlusion; RT retinal tear; SB scleral buckle; PPV pars plana vitrectomy; VH Vitreous hemorrhage; PRP panretinal laser photocoagulation; IVK intravitreal kenalog ; VMT vitreomacular traction; MH Macular hole;  NVD neovascularization of the disc; NVE neovascularization elsewhere; AREDS age related eye disease study; ARMD age related macular degeneration; POAG primary open angle glaucoma; EBMD epithelial/anterior basement membrane dystrophy; ACIOL anterior chamber intraocular lens; IOL intraocular lens; PCIOL posterior chamber intraocular lens; Phaco/IOL phacoemulsification with intraocular lens placement; PRK photorefractive keratectomy; LASIK laser assisted in situ keratomileusis; HTN hypertension; DM diabetes mellitus; COPD chronic obstructive pulmonary  disease

## 2023-11-02 ENCOUNTER — Ambulatory Visit (INDEPENDENT_AMBULATORY_CARE_PROVIDER_SITE_OTHER): Admitting: Ophthalmology

## 2023-11-02 ENCOUNTER — Encounter (INDEPENDENT_AMBULATORY_CARE_PROVIDER_SITE_OTHER): Payer: Self-pay | Admitting: Ophthalmology

## 2023-11-02 DIAGNOSIS — Z7984 Long term (current) use of oral hypoglycemic drugs: Secondary | ICD-10-CM

## 2023-11-02 DIAGNOSIS — I10 Essential (primary) hypertension: Secondary | ICD-10-CM

## 2023-11-02 DIAGNOSIS — H4312 Vitreous hemorrhage, left eye: Secondary | ICD-10-CM

## 2023-11-02 DIAGNOSIS — Z794 Long term (current) use of insulin: Secondary | ICD-10-CM

## 2023-11-02 DIAGNOSIS — E113513 Type 2 diabetes mellitus with proliferative diabetic retinopathy with macular edema, bilateral: Secondary | ICD-10-CM

## 2023-11-02 DIAGNOSIS — H4311 Vitreous hemorrhage, right eye: Secondary | ICD-10-CM

## 2023-11-02 DIAGNOSIS — Z7985 Long-term (current) use of injectable non-insulin antidiabetic drugs: Secondary | ICD-10-CM

## 2023-11-02 DIAGNOSIS — H4313 Vitreous hemorrhage, bilateral: Secondary | ICD-10-CM

## 2023-11-02 DIAGNOSIS — H35033 Hypertensive retinopathy, bilateral: Secondary | ICD-10-CM

## 2023-11-02 DIAGNOSIS — Z961 Presence of intraocular lens: Secondary | ICD-10-CM

## 2023-11-02 DIAGNOSIS — H25812 Combined forms of age-related cataract, left eye: Secondary | ICD-10-CM

## 2023-11-02 DIAGNOSIS — H44001 Unspecified purulent endophthalmitis, right eye: Secondary | ICD-10-CM

## 2023-11-03 ENCOUNTER — Encounter (INDEPENDENT_AMBULATORY_CARE_PROVIDER_SITE_OTHER): Payer: Self-pay | Admitting: Ophthalmology

## 2023-11-03 MED ORDER — FARICIMAB-SVOA 6 MG/0.05ML IZ SOSY
6.0000 mg | PREFILLED_SYRINGE | INTRAVITREAL | Status: AC | PRN
  Administered 2023-11-03: 6 mg via INTRAVITREAL

## 2023-11-21 DIAGNOSIS — N179 Acute kidney failure, unspecified: Secondary | ICD-10-CM | POA: Diagnosis not present

## 2023-11-21 DIAGNOSIS — R7401 Elevation of levels of liver transaminase levels: Secondary | ICD-10-CM | POA: Diagnosis not present

## 2023-11-21 DIAGNOSIS — M797 Fibromyalgia: Secondary | ICD-10-CM | POA: Diagnosis not present

## 2023-11-21 DIAGNOSIS — E1143 Type 2 diabetes mellitus with diabetic autonomic (poly)neuropathy: Secondary | ICD-10-CM | POA: Diagnosis not present

## 2023-11-21 DIAGNOSIS — R5383 Other fatigue: Secondary | ICD-10-CM | POA: Diagnosis not present

## 2023-11-21 DIAGNOSIS — T447X5A Adverse effect of beta-adrenoreceptor antagonists, initial encounter: Secondary | ICD-10-CM | POA: Diagnosis not present

## 2023-11-21 DIAGNOSIS — K7200 Acute and subacute hepatic failure without coma: Secondary | ICD-10-CM | POA: Diagnosis not present

## 2023-11-21 DIAGNOSIS — I10 Essential (primary) hypertension: Secondary | ICD-10-CM | POA: Diagnosis not present

## 2023-11-21 DIAGNOSIS — J690 Pneumonitis due to inhalation of food and vomit: Secondary | ICD-10-CM | POA: Diagnosis not present

## 2023-11-21 DIAGNOSIS — J9601 Acute respiratory failure with hypoxia: Secondary | ICD-10-CM | POA: Diagnosis not present

## 2023-11-21 DIAGNOSIS — E785 Hyperlipidemia, unspecified: Secondary | ICD-10-CM | POA: Diagnosis not present

## 2023-11-21 DIAGNOSIS — T465X1A Poisoning by other antihypertensive drugs, accidental (unintentional), initial encounter: Secondary | ICD-10-CM | POA: Diagnosis not present

## 2023-11-21 DIAGNOSIS — K3184 Gastroparesis: Secondary | ICD-10-CM | POA: Diagnosis not present

## 2023-11-21 DIAGNOSIS — R531 Weakness: Secondary | ICD-10-CM | POA: Diagnosis not present

## 2023-11-21 DIAGNOSIS — G4700 Insomnia, unspecified: Secondary | ICD-10-CM | POA: Diagnosis not present

## 2023-11-21 DIAGNOSIS — R579 Shock, unspecified: Secondary | ICD-10-CM | POA: Diagnosis not present

## 2023-11-21 DIAGNOSIS — Z833 Family history of diabetes mellitus: Secondary | ICD-10-CM | POA: Diagnosis not present

## 2023-11-21 DIAGNOSIS — T447X1A Poisoning by beta-adrenoreceptor antagonists, accidental (unintentional), initial encounter: Secondary | ICD-10-CM | POA: Diagnosis not present

## 2023-11-21 DIAGNOSIS — E11319 Type 2 diabetes mellitus with unspecified diabetic retinopathy without macular edema: Secondary | ICD-10-CM | POA: Diagnosis not present

## 2023-11-21 DIAGNOSIS — E875 Hyperkalemia: Secondary | ICD-10-CM | POA: Diagnosis not present

## 2023-11-21 DIAGNOSIS — Z9049 Acquired absence of other specified parts of digestive tract: Secondary | ICD-10-CM | POA: Diagnosis not present

## 2023-11-21 DIAGNOSIS — E1165 Type 2 diabetes mellitus with hyperglycemia: Secondary | ICD-10-CM | POA: Diagnosis not present

## 2023-11-21 DIAGNOSIS — G894 Chronic pain syndrome: Secondary | ICD-10-CM | POA: Diagnosis not present

## 2023-11-21 DIAGNOSIS — E872 Acidosis, unspecified: Secondary | ICD-10-CM | POA: Diagnosis not present

## 2023-11-21 DIAGNOSIS — K760 Fatty (change of) liver, not elsewhere classified: Secondary | ICD-10-CM | POA: Diagnosis not present

## 2023-11-21 DIAGNOSIS — Z791 Long term (current) use of non-steroidal anti-inflammatories (NSAID): Secondary | ICD-10-CM | POA: Diagnosis not present

## 2023-11-21 DIAGNOSIS — T461X1A Poisoning by calcium-channel blockers, accidental (unintentional), initial encounter: Secondary | ICD-10-CM | POA: Diagnosis not present

## 2023-11-21 DIAGNOSIS — T461X5A Adverse effect of calcium-channel blockers, initial encounter: Secondary | ICD-10-CM | POA: Diagnosis not present

## 2023-11-21 DIAGNOSIS — K9041 Non-celiac gluten sensitivity: Secondary | ICD-10-CM | POA: Diagnosis not present

## 2023-11-21 DIAGNOSIS — Z7984 Long term (current) use of oral hypoglycemic drugs: Secondary | ICD-10-CM | POA: Diagnosis not present

## 2023-11-21 DIAGNOSIS — Z7985 Long-term (current) use of injectable non-insulin antidiabetic drugs: Secondary | ICD-10-CM | POA: Diagnosis not present

## 2023-11-21 NOTE — H&P (Signed)
 PULMONARY / CRITICAL CARE ADMISSION [facility] Date of Encounter: 11/21/2023   Assessment / Plan   Accidental overdose of antihypertensive Active Hospital Problems   *Accidental overdose of antihypertensive    Problem Addressed (not all active problems may be addressed below)  Plan     Accidental Overdose on ToprolXL and Verapamil  CR  HTN HLD  Normally takes Verapamil  240mg  CR once daily. Was refilling her pill container for the week (every Tuesday night) and believes she accidentally took her night-time BP meds twice. Reportedly after she had gone to bed, her husband awoke to her gurgling/vomiting and extremely drowsy.  Received glucagon, atropine , calcium  and fluids with EMS. Required epi gtt with EMS and was briefly on epi in ED but remains off with MAP >65 and HR >60 after receiving addtl fluids, calcium , and glucagon   Epi weaned for MAP 60-65  BP elevated now Forreston episode with bicarb admin for K shift Trend lactic acid   Suspected Aspiration Event Aspiration Pneumonitis   2L O2 requirement for comfort. No cough, respiratory distress. Holding abx for now but reconsider if fevers, increased O2 requirements  Aggressive pulmonary hygiene   Uncontrolled T2DM  Hyperglycemia, POA Diabetic Retinopathy  Diabetic Gastroparesis    CCM glycemic protocol Check A1c   Chronic Pain Syndrome  Chronic Prescription Opiate Use  Fibromyalgia  Insomnia   Resume home medications when appropriate   Acute Transamintis suspect 2/2 shock liver Hepatic Steatosis  History of Gastritis   CCM SUP  Trend hepatic function for resolution   AKI  Trend renal indices  Strict Is/Os  Avoid renal insults        Prophylaxis Prophylaxis:    DVT: SCDs   GI:  PPI  Ethics:   No Order   Disposition ICU   Discussed plan of care with MD   Billing [id number]       Subjective   Patient ID:  Gloria Gomez is a 53 y.o. (DOB 1970/12/20) female   ADMITTING DIAGNOSIS:  Shock (*) [R57.9] Accidental drug overdose, initial encounter [T50.901A] Adverse effect of calcium -channel blocker, initial encounter [T46.1X5A] Acute hypoxemic respiratory failure (*) [J96.01] Accidental overdose of antihypertensive [T46.5X1A] Adverse effect of beta-blocker, initial encounter [T44.7X5A] ADMITTING PHYSICIAN: Alm Dasen, MD  PRIMARY CARE PHYSICIAN:No primary care provider on file.  HPI:   53F PMHx including but not limited to HTN, HLD, DM, Chronic Pain Syndrome, Fibromyalgia, Chronic Prescription Opiate Use, T2DM.   ED Course  Patient brought in by EMS to [facility] ED early 09/24. Normally takes Verapamil  240mg  CR once daily and Toprol  XL100mg  twice daily. Was refilling her pill container for the week las tonight (every Tuesday night) and believes she accidentally took her night-time BP meds twice. Reportedly after she had gone to bed, her husband awoke to her gurgling/vomiting and extremely drowsy. Received glucagon, atropine , calcium  and fluids with EMS. Required epi gtt with EMS and was briefly on epi in ED but remains off with MAP >65 and HR >60 after receiving addtl fluids, calcium , and glucagon. No complaints voiced in ED. Very pleasant--mainly frustrated and the fact that she is spending her birthday in the ER after a silly mix-up. Admit to ICU for close monitoring.   Patient Active Problem List   Diagnosis Date Noted  . Accidental overdose of antihypertensive 11/21/2023  . Type II or unspecified type diabetes mellitus without mention of complication, uncontrolled 12/02/2007    Class: Chronic    Type 2 Diabetes Mellitus - Uncomplicated, Uncontrolled - dx  2008    . Other and unspecified hyperlipidemia 06/20/2007    Class: Chronic    Hyperlipidemia     Past Medical History:  Diagnosis Date  . Diabetes mellitus  (*)    Past Surgical History:  Procedure Laterality Date  . Cholecystectomy     Social History[1] Family History  Problem Relation Age of Onset  .  Cancer Paternal Grandmother        cervical  . Diabetes Father   . Ovarian cancer Mother   . Cancer Mother        brain tumur   Allergies[2]   Immunization History  Administered Date(s) Administered  . Influenza Tri 12/29/2011  . Pneumococcal Conjugate 7 (Prevnar) 02/27/2009    Past Medical History, Past Surgery History, Social History, and Family History, Allergies, and Immunizations were reviewed and updated.    REVIEW OF SYSTEMS: .Pertinent items are noted in HPI.   HOME MEDICATIONS:  Prior to Admission medications  Medication Sig Start Date End Date Taking? Authorizing Provider  dapagliflozin (FARXIGA) 10 mg tablet Take one tablet (10 mg dose) by mouth every morning.   Yes Historical Provider, MD  gabapentin  (NEURONTIN ) 300 mg capsule Take one capsule (300 mg dose) by mouth 3 (three) times a day as needed (PAIN).   Yes Historical Provider, MD  HYDROcodone -acetaminophen  (NORCO) 5-325 mg per tablet Take one tablet by mouth every 6 (six) hours as needed for Pain (MODERATE PAIN). Max Daily Amount: 4 tablets   Yes Historical Provider, MD  losartan  potassium (COZAAR ) 100 mg tablet Take one tablet (100 mg dose) by mouth every morning.   Yes Historical Provider, MD  meloxicam  (MOBIC ) 15 mg tablet Take one tablet (15 mg dose) by mouth daily as needed for Pain.   Yes Historical Provider, MD  metformin  (GLUCOPHAGE ) 1000 MG tablet Take one tablet (1,000 mg dose) by mouth 2 (two) times daily.   Yes Historical Provider, MD  METFORMIN  HCL PO Take by mouth.    Historical Provider, MD  pantoprazole  sodium (PROTONIX ) 40 mg tablet Take one tablet (40 mg dose) by mouth every morning.   Yes Historical Provider, MD  promethazine  (PHENERGAN ) 12.5 MG tablet Take one tablet (12.5 mg dose) by mouth every 6 (six) hours as needed for Nausea.   Yes Historical Provider, MD  rosuvastatin  calcium  (CRESTOR ) 20 mg tablet Take one tablet (20 mg dose) by mouth every other day.   Yes Historical Provider, MD   tirzepatide [facility]) 12.5 mg/0.5 mL SOAJ injection Inject 0.5 mLs (12.5 mg dose) into the skin once a week. TUESDAY   Yes Historical Provider, MD  tranexamic Acid (LYSTEDA) 650 MG TABS Take 2 pills 3x daily for 5d once a month with period 07/23/12   Shona LELON Fireman, NP  verapamil  (CALAN -SR) 240 mg ER tablet Take one tablet (240 mg dose) by mouth at bedtime.   Yes Historical Provider, MD   Scheduled Meds: . insulin regular (HUMULIN R,NOVOLIN R)  2-10 Units Subcutaneous Q6H SCH  . pantoprazole   40 mg IntraVENous Daily  . verapamil   120 mg Oral HS   Continuous Infusions:   PRN Meds:dextrose , 15-30 g of glucose, PRN  Or dextrose , 12.5 g, PRN  Or dextrose , 25 g, PRN  Or glucagon, 1 mg, PRN ondansetron , 4 mg, Q6H PRN       Objective   Vital signs in last 24 hours: Intake/Output:  Temp:  [97.6 F (36.4 C)-98 F (36.7 C)] 98 F (36.7 C) Heart Rate:  [55-98] 78 Resp:  [  14-41] 14 BP: (72-136)/(39-100) 134/100 SpO2:  [91 %-100 %] 93 % Weight: 78.8 kg (173 lb 11.6 oz)  RESPIRATORY/VENTILATOR DATA O2 Device: Nasal cannula  O2 Flow Rate (L/min): 2 L/min  O2 Device: Nasal cannula O2 Flow Rate (L/min): 2 L/min SpO2: 93 %  HEMODYNAMICS    Intake/Output Summary (Last 24 hours) at 11/21/2023 1124 Last data filed at 11/21/2023 0334 Gross per 24 hour  Intake 1100.01 ml  Output --  Net 1100.01 ml       PHYSICAL EXAM  CONST: Middle age female. No acute distress. Extremely pleasant.  HEENT/NEURO: PERRL. Normocephalic, atraumatic. No focal deficits. PULM: Clear bilateral. No wheezing, rales, rhonchi. 2L O2 requirement.  CV: S1, S2. No murmurs, gallups, rubs.  MSK: No appreciable peripheral edema. Well perfused.   Recent: Labs: Recent Labs    Units 11/21/23 0206  WBC thou/mcL 23.7*  HGB gm/dL 88.6  HCT % 63.7  PLT thou/mcL 367                                        Recent Labs    Units 11/21/23 0857 11/21/23 0522 11/21/23 0206  NA mmol/L 140  --  140  K mmol/L  6.2*  --  4.7  CL mmol/L 109*  --  109*  CO2 mmol/L 19*  --  13*  BUN mg/dL 49*  --  53*  CREATININE mg/dL 8.79*  --  8.47*  GLUCOSE mg/dL 94 767* 700*  CALCIUM  mg/dL 89.9  --  9.9  PHOS mg/dL  --   --  5.4*  AST U/L  --   --  53*  ALT U/L  --   --  43*  ALKPHOS U/L  --   --  87  BILITOT mg/dL  --   --  0.3  ALBUMIN gm/dL  --   --  3.9     No results found for this or any previous visit (from the past 48 hours).  New Microbiological Data in Last 24 hrs: Microbiology Results     None       New Radiographic Data in Last 72 Hours: XR Chest Ap Portable Result Date: 11/21/2023 AP chest radiograph: 11/21/2023 2:12 AM History: 53 years  old Female with Weakness. Comparison: None available Findings: The cardiomediastinal silhouette is mildly enlarged. There is fluid at the midline chest and left chest. There is elevation of the right hemidiaphragm. No pneumothorax is seen. No acute airspace opacities are seen. No discrete pleural effusion is apparent.   Impression: No acute airspace opacities are seen. Electronically Signed by: Rock Croft, MD on 11/21/2023 2:13 AM   RECORD REVIEW old medical records.   Patient's care requires close monitoring of vital signs, hemodynamics, respiratory and/or cardiac monitoring, review of multiple databases, multi organ system assessment, discussion with family, other specialists and medical decision making of high complexity.   Complex Evaluation and Management was necessary to treat and/or prevent  deterioration of the following conditions- as above  High complexity decision making was required to treat this illness and prevent further deterioration of the patient's condition.  While this patient is not critically ill, the co-morbid states involved are high risk for deterioration or require advanced diagnostics     Activities at the patient's bedside, on the floor/unit, coordinating care, and discussing patient's status with other medical  staff/patient/family did not include any separate billable procedures or overlap with critical  care and/or procedural services delivered by me or other providers of my team.    ELECTRONICALLY SIGNED Nancyann CHRISTELLA Duet, MD 11/21/2023 11:24 AM         [1] Social History Socioeconomic History  . Marital status: Married  Tobacco Use  . Smoking status: Never  Substance and Sexual Activity  . Alcohol use: No  . Drug use: No  . Sexual activity: Yes    Partners: Male    Birth control/protection: None  [2] Allergies Allergen Reactions  . Gluten Swelling  . Pioglitazone  Unknown  . Lisinopril  Cough

## 2023-11-22 NOTE — Nursing Note (Signed)
 Received report from [facility] in ICU.  Pt to transfer to 4 general bed 40 for continued medical treatment

## 2023-11-22 NOTE — Nursing Note (Signed)
 Transferred pt to [id number] via wheelchair on portable tele monitor. Personal belongings, including a large green bag and a cell phone, were moved with pt.

## 2023-11-22 NOTE — H&P (Signed)
 PULMONARY / CRITICAL CARE ADMISSION [facility] Date of Encounter: 11/22/2023   Assessment / Plan   Accidental overdose of antihypertensive Active Hospital Problems   *Accidental overdose of antihypertensive    Problem Addressed (not all active problems may be addressed below)  Plan     Accidental Overdose on ToprolXL and Verapamil  CR  HTN HLD  Normally takes Verapamil  240mg  CR once daily. Was refilling her pill container for the week (every Tuesday night) and believes she accidentally took her night-time BP meds twice. Reportedly after she had gone to bed, her husband awoke to her gurgling/vomiting and extremely drowsy.  Received glucagon, atropine , calcium  and fluids with EMS. Required epi gtt with EMS and was briefly on epi in ED but remains off with MAP >65 and HR >60 after receiving addtl fluids, calcium , and glucagon   Epi weaned for MAP 60-65  BP elevated now, resumed half dose Verapamil  yesterday, will increase to routine for tonight Brady episode with bicarb admin for K shift, self limited and has done well Trend lactic acid, now normal    Suspected Aspiration Event Aspiration Pneumonitis   2L O2 requirement for comfort. No cough, respiratory distress. Holding abx for now but reconsider if fevers, increased O2 requirements  Aggressive pulmonary hygiene   Uncontrolled T2DM  Hyperglycemia, POA Diabetic Retinopathy  Diabetic Gastroparesis    CCM glycemic protocol Check A1c   Chronic Pain Syndrome  Chronic Prescription Opiate Use  Fibromyalgia  Insomnia   Resuming home meds as able  Acute Transamintis suspect 2/2 shock liver Hepatic Steatosis  History of Gastritis   CCM SUP  Trend hepatic function for resolution   AKI  Trend renal indices  Strict Is/Os  Avoid renal insults        Prophylaxis Prophylaxis:    DVT: SCDs   GI:  PPI  Ethics:   No Order   Disposition Can move to floor with NICS   Discussed plan of care with MD   Billing  [id number]       Subjective   Patient ID:  Gloria Gomez is a 53 y.o. (DOB 12-13-1970) female   ADMITTING DIAGNOSIS: Shock [R57.9] Accidental drug overdose, initial encounter [T50.901A] Adverse effect of calcium -channel blocker, initial encounter [T46.1X5A] Acute hypoxemic respiratory failure [J96.01] Accidental overdose of antihypertensive [T46.5X1A] Adverse effect of beta-blocker, initial encounter [T44.7X5A] ADMITTING PHYSICIAN: Alm Dasen, MD  PRIMARY CARE PHYSICIAN:No primary care provider on file.  HPI:   89F PMHx including but not limited to HTN, HLD, DM, Chronic Pain Syndrome, Fibromyalgia, Chronic Prescription Opiate Use, T2DM.   ED Course  Patient brought in by EMS to [facility] ED early 09/24. Normally takes Verapamil  240mg  CR once daily and Toprol  XL100mg  twice daily. Was refilling her pill container for the week las tonight (every Tuesday night) and believes she accidentally took her night-time BP meds twice. Reportedly after she had gone to bed, her husband awoke to her gurgling/vomiting and extremely drowsy. Received glucagon, atropine , calcium  and fluids with EMS. Required epi gtt with EMS and was briefly on epi in ED but remains off with MAP >65 and HR >60 after receiving addtl fluids, calcium , and glucagon. No complaints voiced in ED. Very pleasant--mainly frustrated and the fact that she is spending her birthday in the ER after a silly mix-up. Admit to ICU for close monitoring.   Patient Active Problem List   Diagnosis Date Noted  . Accidental overdose of antihypertensive 11/21/2023  . Type II or unspecified type diabetes mellitus without  mention of complication, uncontrolled 12/02/2007    Class: Chronic    Type 2 Diabetes Mellitus - Uncomplicated, Uncontrolled - dx 2008    . Other and unspecified hyperlipidemia 06/20/2007    Class: Chronic    Hyperlipidemia     Past Medical History:  Diagnosis Date  . Diabetes mellitus  (*)    Past Surgical History:   Procedure Laterality Date  . Cholecystectomy     Social History[1] Family History  Problem Relation Age of Onset  . Cancer Paternal Grandmother        cervical  . Diabetes Father   . Ovarian cancer Mother   . Cancer Mother        brain tumur   Allergies[2]   Immunization History  Administered Date(s) Administered  . Influenza Tri 12/29/2011  . Pneumococcal Conjugate 7 (Prevnar) 02/27/2009    Past Medical History, Past Surgery History, Social History, and Family History, Allergies, and Immunizations were reviewed and updated.    REVIEW OF SYSTEMS: .Pertinent items are noted in HPI.   HOME MEDICATIONS:  Prior to Admission medications  Medication Sig Start Date End Date Taking? Authorizing Provider  dapagliflozin (FARXIGA) 10 mg tablet Take one tablet (10 mg dose) by mouth every morning.   Yes Historical Provider, MD  gabapentin  (NEURONTIN ) 300 mg capsule Take one capsule (300 mg dose) by mouth 3 (three) times a day as needed (PAIN).   Yes Historical Provider, MD  HYDROcodone -acetaminophen  (NORCO) 5-325 mg per tablet Take one tablet by mouth every 6 (six) hours as needed for Pain (MODERATE PAIN). Max Daily Amount: 4 tablets   Yes Historical Provider, MD  losartan  potassium (COZAAR ) 100 mg tablet Take one tablet (100 mg dose) by mouth every morning.   Yes Historical Provider, MD  meloxicam  (MOBIC ) 15 mg tablet Take one tablet (15 mg dose) by mouth daily as needed for Pain.   Yes Historical Provider, MD  metformin  (GLUCOPHAGE ) 1000 MG tablet Take one tablet (1,000 mg dose) by mouth 2 (two) times daily.   Yes Historical Provider, MD  METFORMIN  HCL PO Take by mouth.    Historical Provider, MD  pantoprazole  sodium (PROTONIX ) 40 mg tablet Take one tablet (40 mg dose) by mouth every morning.   Yes Historical Provider, MD  promethazine  (PHENERGAN ) 12.5 MG tablet Take one tablet (12.5 mg dose) by mouth every 6 (six) hours as needed for Nausea.   Yes Historical Provider, MD  rosuvastatin   calcium  (CRESTOR ) 20 mg tablet Take one tablet (20 mg dose) by mouth every other day.   Yes Historical Provider, MD  tirzepatide [facility]) 12.5 mg/0.5 mL SOAJ injection Inject 0.5 mLs (12.5 mg dose) into the skin once a week. TUESDAY   Yes Historical Provider, MD  tranexamic Acid (LYSTEDA) 650 MG TABS Take 2 pills 3x daily for 5d once a month with period 07/23/12   Shona LELON Fireman, NP  verapamil  (CALAN -SR) 240 mg ER tablet Take one tablet (240 mg dose) by mouth at bedtime.   Yes Historical Provider, MD   Scheduled Meds: . insulin regular (HUMULIN R,NOVOLIN R)  2-10 Units Subcutaneous Q6H SCH  . pantoprazole   40 mg IntraVENous Daily  . verapamil   240 mg Oral HS   Continuous Infusions:   PRN Meds:dextrose , 15-30 g of glucose, PRN  Or dextrose , 12.5 g, PRN  Or dextrose , 25 g, PRN  Or glucagon, 1 mg, PRN ondansetron , 4 mg, Q6H PRN       Objective   Vital signs in last 24 hours:  Intake/Output:  Temp:  [98.1 F (36.7 C)] 98.1 F (36.7 C) Heart Rate:  [78-95] 95 Resp:  [14-34] 14 BP: (124-153)/(58-109) 148/68 SpO2:  [87 %-96 %] 95 % Weight: 78.8 kg (173 lb 11.6 oz)  RESPIRATORY/VENTILATOR DATA O2 Device: Nasal cannula  O2 Flow Rate (L/min): 2 L/min  O2 Device: Nasal cannula O2 Flow Rate (L/min): 1 L/min SpO2: 95 %  HEMODYNAMICS    Intake/Output Summary (Last 24 hours) at 11/22/2023 0837 Last data filed at 11/22/2023 0513 Gross per 24 hour  Intake 720 ml  Output 1650 ml  Net -930 ml       PHYSICAL EXAM  CONST: Middle age female. No acute distress. Extremely pleasant.  HEENT/NEURO: PERRL. Normocephalic, atraumatic. No focal deficits. PULM: Clear bilateral. No wheezing, rales, rhonchi. 2L O2 requirement.  CV: S1, S2. No murmurs, gallups, rubs.  MSK: No appreciable peripheral edema. Well perfused.   Recent: Labs: Recent Labs    Units 11/22/23 0037 11/21/23 0206  WBC thou/mcL 11.3* 23.7*  HGB gm/dL 89.8* 88.6  HCT % 67.9* 36.2  PLT thou/mcL 311 367                                         Recent Labs    Units 11/22/23 0641 11/22/23 0037 11/22/23 0019 11/21/23 1713 11/21/23 1444 11/21/23 1309 11/21/23 0857 11/21/23 0522 11/21/23 0206  NA mmol/L  --  142  --   --  141  --  140  --  140  K mmol/L  --  4.4  --   --  5.5*  --  6.2*  --  4.7  CL mmol/L  --  108  --   --  108  --  109*  --  109*  CO2 mmol/L  --  22  --   --  19*  --  19*  --  13*  BUN mg/dL  --  29*  --   --  40*  --  49*  --  53*  CREATININE mg/dL  --  8.99  --   --  8.99  --  1.20*  --  1.52*  GLUCOSE mg/dL 889* 94 888*   < > 872*   < > 94   < > 299*  CALCIUM  mg/dL  --  9.4  --   --  9.5  --  10.0  --  9.9  PHOS mg/dL  --  3.4  --   --   --   --   --   --  5.4*  AST U/L  --  21  --   --   --   --   --   --  53*  ALT U/L  --  40  --   --   --   --   --   --  43*  ALKPHOS U/L  --  82  --   --   --   --   --   --  87  BILITOT mg/dL  --  0.2  --   --   --   --   --   --  0.3  ALBUMIN gm/dL  --  3.7  --   --   --   --   --   --  3.9   < > = values in this interval not displayed.     No results found  for this or any previous visit (from the past 48 hours).  New Microbiological Data in Last 24 hrs: Microbiology Results     None       New Radiographic Data in Last 72 Hours: XR Chest Ap Portable Result Date: 11/21/2023 AP chest radiograph: 11/21/2023 2:12 AM History: 53 years  old Female with Weakness. Comparison: None available Findings: The cardiomediastinal silhouette is mildly enlarged. There is fluid at the midline chest and left chest. There is elevation of the right hemidiaphragm. No pneumothorax is seen. No acute airspace opacities are seen. No discrete pleural effusion is apparent.   Impression: No acute airspace opacities are seen. Electronically Signed by: Rock Croft, MD on 11/21/2023 2:13 AM   RECORD REVIEW old medical records.   Patient's care requires close monitoring of vital signs, hemodynamics, respiratory and/or cardiac monitoring, review of  multiple databases, multi organ system assessment, discussion with family, other specialists and medical decision making of high complexity.   Complex Evaluation and Management was necessary to treat and/or prevent  deterioration of the following conditions- as above  High complexity decision making was required to treat this illness and prevent further deterioration of the patient's condition.  While this patient is not critically ill, the co-morbid states involved are high risk for deterioration or require advanced diagnostics     Activities at the patient's bedside, on the floor/unit, coordinating care, and discussing patient's status with other medical staff/patient/family did not include any separate billable procedures or overlap with critical care and/or procedural services delivered by me or other providers of my team.    ELECTRONICALLY SIGNED Nancyann CHRISTELLA Duet, MD 11/22/2023 8:37 AM          [1] Social History Socioeconomic History  . Marital status: Married  Tobacco Use  . Smoking status: Never  Substance and Sexual Activity  . Alcohol use: No  . Drug use: No  . Sexual activity: Yes    Partners: Male    Birth control/protection: None  [2] Allergies Allergen Reactions  . Gluten Swelling  . Pioglitazone  Unknown  . Lisinopril  Cough

## 2023-11-23 NOTE — Care Plan (Signed)
  Problem: Discharge Planning Goal: Knowledge of medical problems (What is my main problem?) Outcome: Progressing Goal: Knowledge of self care (What do I need to do when I go home?) Outcome: Progressing Goal: Knowledge of treatment plan (Why is it important for me to do this?) Outcome: Progressing Goal: Knowledge of medication management Outcome: Progressing   Problem: Injury Risk, Abnormal Glucose Level Goal: Glucose level within specified parameters Outcome: Progressing   Problem: Sensory Perception - Impaired Goal: Absence of physical injury Outcome: Progressing   Problem: Fall Prevention Goal: Absence of falls Outcome: Progressing   Problem: Pressure Injury Goal: Pressure injury healing Outcome: Progressing Goal: Absence of new pressure injury Outcome: Progressing   Problem: Tissue Perfusion, Cardiopulmonary - Altered Goal: Blood pressure within specified parameters Outcome: Progressing

## 2023-11-23 NOTE — Care Plan (Signed)
  Problem: Discharge Planning Goal: Knowledge of medical problems (What is my main problem?) 11/23/2023 1451 by Conda Bring, LPN Outcome: Adequate for Discharge 11/23/2023 1340 by Conda Bring, LPN Outcome: Progressing Goal: Knowledge of self care (What do I need to do when I go home?) 11/23/2023 1451 by Conda Bring, LPN Outcome: Adequate for Discharge 11/23/2023 1340 by Conda Bring, LPN Outcome: Progressing Goal: Knowledge of treatment plan (Why is it important for me to do this?) 11/23/2023 1451 by Conda Bring, LPN Outcome: Adequate for Discharge 11/23/2023 1340 by Conda Bring, LPN Outcome: Progressing Goal: Knowledge of medication management 11/23/2023 1451 by Conda Bring, LPN Outcome: Adequate for Discharge 11/23/2023 1340 by Conda Bring, LPN Outcome: Progressing   Problem: Injury Risk, Abnormal Glucose Level Goal: Glucose level within specified parameters 11/23/2023 1451 by Conda Bring, LPN Outcome: Adequate for Discharge 11/23/2023 1340 by Conda Bring, LPN Outcome: Progressing   Problem: Sensory Perception - Impaired Goal: Absence of physical injury 11/23/2023 1451 by Conda Bring, LPN Outcome: Adequate for Discharge 11/23/2023 1340 by Conda Bring, LPN Outcome: Progressing   Problem: Fall Prevention Goal: Absence of falls 11/23/2023 1451 by Conda Bring, LPN Outcome: Adequate for Discharge 11/23/2023 1340 by Conda Bring, LPN Outcome: Progressing   Problem: Pressure Injury Goal: Pressure injury healing 11/23/2023 1451 by Conda Bring, LPN Outcome: Adequate for Discharge 11/23/2023 1340 by Conda Bring, LPN Outcome: Progressing Goal: Absence of new pressure injury 11/23/2023 1451 by Conda Bring, LPN Outcome: Adequate for Discharge 11/23/2023 1340 by Conda Bring, LPN Outcome: Progressing   Problem: Tissue Perfusion, Cardiopulmonary - Altered Goal: Blood pressure within specified parameters 11/23/2023 1451 by Conda Bring, LPN Outcome: Adequate for Discharge 11/23/2023 1340 by Conda Bring,  LPN Outcome: Progressing

## 2023-11-25 NOTE — Progress Notes (Signed)
 CARE COORDINATOR CONTACT WITH PATIENT/CAREGIVER AFTER HOSPITAL DISCHARGE   I have been unable to reach patient for hospital follow up. Left a voicemail and sent MyChart message.   Hello Gloria Gomez, this is Misty,RN from [facility]. I tried reaching you to check in on your progress after your recent visit, but I was unable to get in touch. A MyChart message has been sent. You can reach me at [phone] if you have any questions. Unfortunately, you cannot respond to this message at this time.

## 2023-11-26 ENCOUNTER — Telehealth: Payer: Self-pay

## 2023-11-26 ENCOUNTER — Ambulatory Visit: Admitting: Family Medicine

## 2023-11-26 ENCOUNTER — Encounter: Payer: Self-pay | Admitting: Family Medicine

## 2023-11-26 VITALS — BP 122/78 | HR 95 | Temp 97.6°F | Ht 64.0 in | Wt 161.4 lb

## 2023-11-26 DIAGNOSIS — I10 Essential (primary) hypertension: Secondary | ICD-10-CM

## 2023-11-26 DIAGNOSIS — I951 Orthostatic hypotension: Secondary | ICD-10-CM

## 2023-11-26 DIAGNOSIS — T465X1A Poisoning by other antihypertensive drugs, accidental (unintentional), initial encounter: Secondary | ICD-10-CM | POA: Diagnosis not present

## 2023-11-26 NOTE — Progress Notes (Signed)
 11/26/2023  CC:  Chief Complaint  Patient presents with   Hospitalization Follow-up    Patient is a 53 y.o.  female who presents for  hospital follow up. Dates hospitalized: 9/24 to 11/22/2023. Days since d/c from hospital: 4 days Patient was discharged from hospital to home. Reason for admission to hospital: Accidental overdose of antihypertensive.  Acute hypoxic respiratory failure, shock.   I have reviewed patient's discharge summary plus pertinent specific notes, labs, and imaging from the hospitalization.  She accidentally took her nighttime blood pressure medications twice. She required epinephrine  but was stabilized pretty quickly. She was observed in the ICU.  Currently:  Feeling very well.  No dizziness.  Eating and drinking well. Home blood pressures have consistently been normal.  Her typical pulse somewhere around 80-100.  Medication reconciliation was done today and patient is taking meds as recommended by discharging hospitalist/specialist.   Discharge medication list: Farxiga 10 mg a day, gabapentin  300 mg 3 times daily as needed, Vicodin every 6 hours as needed, losartan  100 mg a day, meloxicam  15 mg a day as needed metformin  1000 mg twice daily, pantoprazole  40 mg daily, Phenergan  12.5 mg every 6 hours as needed, rosuvastatin  20 mg every other day, Mounjaro 12.5 mg weekly, and verapamil  ER 240 mg nightly.  ROS as above, plus--> no fevers, no CP, no SOB, no wheezing, no cough, no dizziness, no HAs, no rashes, no melena/hematochezia.  No polyuria or polydipsia.  No myalgias or arthralgias.  No focal weakness, paresthesias, or tremors.  No acute vision or hearing abnormalities.  No dysuria or unusual/new urinary urgency or frequency.  No recent changes in lower legs. No n/v/d or abd pain.  No palpitations.    PMH:  Past Medical History:  Diagnosis Date   Abdominal bloating    likely from diab gastroparesis.  Dr. Leigh started trial of reglan  03/2019.   Benign brain  tumor (HCC)    Cystic lesion in cerebral aqueduct region with mild hydrocephalus-- stable MRI 02/2016.  Surveillance MRI 05/2017 --dilated cerebral aqueduct related to aqueductal stenosis and subsequent mild hydrocephalus (due to the 11 mm stable cystic lesion in cerebral aqueduct---?congenitial?.   Bilateral lower extremity edema    Cataract    OU   Dysmenorrhea    vicodin occ during first 2 days of cycle.   Fibromyalgia    Fibula fracture 07/2023   distal, nondisplaced (Right)   Gluten intolerance    pt reports she underwent full GI w/u to r/o celiac dz   Hepatic steatosis    ultrasound 08/2017. Hx of very mild elevation of ALT.  Stable on u/s 06/2020   History of adenomatous polyp of colon 04/08/2019   recall Feb 2024   Hyperlipidemia, mixed    Hypertension    +white coat component   Hypertensive retinopathy of both eyes    Insomnia    Iron deficiency 01/2019   Hb 11.3. Hemoccults neg x 3 03/19/19. EGD and colonoscopy 04/08/19 showed NO cause for IDA.  Pt does have menorrhagia, though, so she'll see her GYN.  started FeSO4 325 qd approx 04/14/19.   Menorrhagia    resulting in IDA 2021   Orthostatic hypotension    PMR (polymyalgia rheumatica) 2022   question of; hx of elevated ESR-->better with prednisone  but prednisone  was contraindicated d/t eye issues/hyperglycemia-->rheum started following her 02/2021->plaquenil 04/2021   Proliferative diabetic retinopathy of both eyes (HCC)    steroid injections 10/2017--improved   Sensorineural hearing loss of left ear  Sudden left hearing loss summer 2016--no improvement with steroids 01/2015 so brain MRI done by Dr. Carlie and it showed brain tumor that was determined to be benign.  Pt's hearing not bad enough for hearing aid as of 06/2016.   Type 2 diabetes with complication (HCC)    +microalbuminuria, diab retpthy, diabetic gastroparesis (gastric emptying study mildly abnl 03/2017).  Recommended lantus  08/2018 but pt declined. Mild microalbuminuria.    Uterine fibroid 2022   per pt report, pelvic u/s in GYN office    PSH:  Past Surgical History:  Procedure Laterality Date   ANOSCOPY  05/12/2019   Procedure: normal exam, minimal hemorrhoid disease. Hyertrophied anal papila, benign appearing, posterior midline. Surgeon: Bernarda Ned MD   CHOLECYSTECTOMY  2000   COLONOSCOPY  04/08/2019   5 adenomas, recall 3 yrs; no cause for IDA found.  Hypertrophied anal papillae->bx showed low grade dysplasia; GI referred her to colorectal surgeon.   ESOPHAGOGASTRODUODENOSCOPY  04/08/2019   mild chronic reactive gastritis. H pylori NEG.  No cause for IDA found.   GAS INSERTION Right 10/31/2018   Procedure: Insertion Of C3F8 Gas;  Surgeon: Valdemar Rogue, MD;  Location: [facility] OR;  Service: Ophthalmology;  Laterality: Right;   GASTRIC EMPTYING SCAN  04/20/2017   Mildly abnormal, particularly the 1st hour of emptying.   MEMBRANE PEEL Right 10/31/2018   Procedure: MEMBRANE PEEL;  Surgeon: Valdemar Rogue, MD;  Location: [facility] OR;  Service: Ophthalmology;  Laterality: Right;   PARS PLANA VITRECTOMY Right 04/12/2018   Procedure: Right PARS PLANA VITRECTOMY WITH 25 GAUGE with intravitreal antibiotics;  Surgeon: Valdemar Rogue, MD;  Location: [facility] OR;  Service: Ophthalmology;  Laterality: Right;   PARS PLANA VITRECTOMY Right 10/31/2018   Procedure: PARS PLANA VITRECTOMY WITH 25 GAUGE;  Surgeon: Valdemar Rogue, MD;  Location: [facility] OR;  Service: Ophthalmology;  Laterality: Right;   PHOTOCOAGULATION WITH LASER Right 10/31/2018   Procedure: Photocoagulation With Laser;  Surgeon: Valdemar Rogue, MD;  Location: [facility] OR;  Service: Ophthalmology;  Laterality: Right;   TRANSTHORACIC ECHOCARDIOGRAM  08/23/2020   Grd I DD, o/w normal.    MEDS:  Outpatient Medications Prior to Visit  Medication Sig Dispense Refill   FARXIGA 10 MG TABS tablet Take 10 mg by mouth daily.     furosemide  (LASIX ) 20 MG tablet TAKE 2 TABLETS BY MOUTH IN THE MORNING AND 1 IN THE EVENING 270 tablet 1    gabapentin  (NEURONTIN ) 300 MG capsule TAKE 1 CAPSULE BY MOUTH THREE TIMES DAILY (Patient taking differently: Take 300 mg by mouth 3 (three) times daily. Patient taking once a day) 90 capsule 0   losartan  (COZAAR ) 100 MG tablet Take 1 tablet (100 mg total) by mouth daily. 90 tablet 3   meloxicam  (MOBIC ) 15 MG tablet Take 1 tablet daily as needed. 30 tablet 0   metFORMIN  (GLUCOPHAGE ) 1000 MG tablet Take 1 tablet (1,000 mg total) by mouth 2 (two) times daily with a meal. 180 tablet 3   MOUNJARO 12.5 MG/0.5ML Pen Inject 12.5 mg into the skin once a week.     pantoprazole  (PROTONIX ) 40 MG tablet Take 1 tablet (40 mg total) by mouth daily. 90 tablet 3   verapamil  (CALAN -SR) 240 MG CR tablet Take 1 tablet (240 mg total) by mouth at bedtime. (Patient taking differently: Take 240 mg by mouth at bedtime. Patient is taking 120 mg qhs) 90 tablet 3   amLODipine  (NORVASC ) 5 MG tablet Take 1 tablet (5 mg total) by mouth daily. (Patient not taking: Reported on  11/26/2023) 90 tablet 1   Continuous Blood Gluc Sensor (FREESTYLE LIBRE 3 SENSOR) MISC by Does not apply route every 14 (fourteen) days. (Patient not taking: Reported on 11/26/2023)     HUMALOG KWIKPEN 100 UNIT/ML KwikPen Inject into the skin. (Patient not taking: Reported on 11/26/2023)     HYDROcodone -acetaminophen  (NORCO/VICODIN) 5-325 MG tablet Take 1-2 tablets by mouth every 6 (six) hours as needed for moderate pain (pain score 4-6). (Patient not taking: Reported on 11/26/2023) 40 tablet 0   insulin aspart (NOVOLOG) 100 UNIT/ML injection Inject 10 Units into the skin 2 (two) times daily with a meal. Per endocrinologist (Patient not taking: Reported on 11/26/2023)     Insulin Glargine  (BASAGLAR KWIKPEN) 100 UNIT/ML 10 units every morning and 24 units nightly (Patient not taking: Reported on 11/26/2023)     metoCLOPramide  (REGLAN ) 5 MG tablet TAKE 1 TABLET BY MOUTH 4 TIMES DAILY BEFORE MEAL(S) AND AT BEDTIME (Patient not taking: Reported on 11/26/2023) 360 tablet 1    metoprolol  succinate (TOPROL -XL) 100 MG 24 hr tablet Take 2 tablets by mouth once daily (Patient not taking: Reported on 11/26/2023) 180 tablet 3   Multiple Vitamin (MULTIVITAMIN WITH MINERALS) TABS tablet Take 1 tablet by mouth daily. (Patient not taking: Reported on 11/26/2023)     NOVOLOG FLEXPEN 100 UNIT/ML FlexPen  (Patient not taking: Reported on 11/26/2023)     promethazine  (PHENERGAN ) 12.5 MG tablet TAKE 1 TO 2 TABLETS BY MOUTH EVERY 6 HOURS AS NEEDED FOR NAUSEA (Patient not taking: Reported on 11/26/2023) 30 tablet 3   rosuvastatin  (CRESTOR ) 40 MG tablet Take 1 tablet (40 mg total) by mouth daily. (Patient not taking: Reported on 11/26/2023) 30 tablet 2   verapamil  (CALAN -SR) 240 MG CR tablet Take by mouth.     No facility-administered medications prior to visit.    Physical Exam     11/26/2023    3:42 PM 08/20/2023    1:06 PM 02/19/2023   11:36 AM  Vitals with BMI  Height 5' 4    Weight 161 lbs 6 oz 169 lbs 6 oz   BMI 27.69    Systolic 122 112 853  Diastolic 78 76 84  Pulse 95 81      Pertinent labs/imaging Day of discharge white blood cell count 11.3, hemoglobin 10.1, platelets 311. Day of discharge metabolic panel: Sodium 142, potassium 4.4, chloride 108, bicarb 22, BUN 29, creatinine 1.0, glucose 110, calcium  9.4, phosphorus 3.4, AST 21, ALT 40, alkaline phosphatase 82, total bilirubin 0.2, albumin 3.7  ASSESSMENT/PLAN:  #1 accidental overdose of antihypertensive. Looking back, she feels like she may have been having some dizziness that we possibly may have incorrectly attributed to glucose fluctuations in the past. Blood pressure control has been good on verapamil  SR 240 mg nightly and losartan  100 mg a day since discharge home.  FOLLOW UP: Keep follow-up scheduled for December 23.  Signed:  Gerlene Hockey, MD           11/26/2023

## 2023-11-26 NOTE — Transitions of Care (Post Inpatient/ED Visit) (Signed)
   11/26/2023  Name: Gloria Gomez MRN: [id number] DOB: 14-Apr-1970  Today's TOC FU Call Status: Today's TOC FU Call Status:: Unsuccessful Call (1st Attempt) Unsuccessful Call (1st Attempt) Date: 11/26/23  Attempted to reach the patient regarding the most recent Inpatient/ED visit.  Follow Up Plan: Additional outreach attempts will be made to reach the patient to complete the Transitions of Care (Post Inpatient/ED visit) call.   Alan Ee, RN, BSN, CEN [employer]- Transition of Care Team.  [facility] [phone]

## 2023-11-27 ENCOUNTER — Telehealth: Payer: Self-pay

## 2023-11-27 DIAGNOSIS — E1165 Type 2 diabetes mellitus with hyperglycemia: Secondary | ICD-10-CM | POA: Diagnosis not present

## 2023-11-27 LAB — BASIC METABOLIC PANEL WITH GFR
BUN: 24 — AB (ref 4–21)
CO2: 21 (ref 13–22)
Chloride: 104 (ref 99–108)
Creatinine: 1 (ref 0.5–1.1)
Glucose: 128
Potassium: 5 meq/L (ref 3.5–5.1)
Sodium: 141 (ref 137–147)

## 2023-11-27 LAB — MICROALBUMIN / CREATININE URINE RATIO: Microalb Creat Ratio: 320

## 2023-11-27 LAB — HEPATIC FUNCTION PANEL
ALT: 27 U/L (ref 7–35)
AST: 18 (ref 13–35)
Alkaline Phosphatase: 95 (ref 25–125)

## 2023-11-27 LAB — PROTEIN / CREATININE RATIO, URINE
Albumin, U: 430.3
Creatinine, Urine: 134.3

## 2023-11-27 LAB — COMPREHENSIVE METABOLIC PANEL WITH GFR
Albumin: 4.6 (ref 3.5–5.0)
Calcium: 10.2 (ref 8.7–10.7)
Globulin: 2.4
eGFR: 65

## 2023-11-27 LAB — HEMOGLOBIN A1C: Hemoglobin A1C: 6.9

## 2023-11-27 NOTE — Transitions of Care (Post Inpatient/ED Visit) (Signed)
   11/27/2023  Name: Knox Holdman MRN: [id number] DOB: 1970-07-28  Today's TOC FU Call Status: Today's TOC FU Call Status:: Unsuccessful Call (2nd Attempt) Unsuccessful Call (2nd Attempt) Date: 11/27/23  Attempted to reach the patient regarding the most recent Inpatient/ED visit.  Follow Up Plan: Additional outreach attempts will be made to reach the patient to complete the Transitions of Care (Post Inpatient/ED visit) call.   Alan Ee, RN, BSN, CEN [employer]- Transition of Care Team.  [facility] [phone]

## 2023-11-28 ENCOUNTER — Telehealth: Payer: Self-pay

## 2023-11-28 NOTE — Transitions of Care (Post Inpatient/ED Visit) (Signed)
 11/28/2023  Name: Gloria Gomez MRN: [id number] DOB: [birth year]  Today's TOC FU Call Status: Today's TOC FU Call Status:: Successful TOC FU Call Completed TOC FU Call Complete Date: 11/28/23 Patient's Name and Date of Birth confirmed.  Transition Care Management Follow-up Telephone Call How have you been since you were released from the hospital?: Better (getting stronger each day.) Any questions or concerns?: No  Items Reviewed: Did you receive and understand the discharge instructions provided?: Yes Medications obtained,verified, and reconciled?: Yes (Medications Reviewed) Any new allergies since your discharge?: No Dietary orders reviewed?: No Do you have support at home?: Yes People in Home [RPT]: spouse Name of Support/Comfort Primary Source: husband  Medications Reviewed Today: Medications Reviewed Today     Reviewed by Rumalda Alan PENNER, RN (Registered Nurse) on 11/28/23 at 1102  Med List Status: <None>   Medication Order Taking? Sig Documenting Provider Last Dose Status Informant  Continuous Blood Gluc Sensor (FREESTYLE LIBRE 3 SENSOR) MISC [id number] Yes by Does not apply route every 14 (fourteen) days. [provider]  Active   FARXIGA 10 MG TABS tablet [id number] Yes Take 10 mg by mouth daily. [provider]  Active   furosemide  (LASIX ) 20 MG tablet [id number] Yes TAKE 2 TABLETS BY MOUTH IN THE MORNING AND 1 IN THE EVENING  Patient taking differently: TAKE 2 TABLETS BY MOUTH IN THE MORNING AND 1 IN THE EVENING   McGowen, Aleene DEL, MD  Active   gabapentin  (NEURONTIN ) 300 MG capsule [id number] Yes TAKE 1 CAPSULE BY MOUTH THREE TIMES DAILY  Patient taking differently: Take 300 mg by mouth 3 (three) times daily. Patient taking once a day   McGowen, Philip H, MD  Active   HUMALOG KWIKPEN 100 UNIT/ML KwikPen [id number] Yes Inject into the skin. [provider]  Active   HYDROcodone -acetaminophen  (NORCO/VICODIN) 5-325 MG tablet [id number] Yes Take 1-2  tablets by mouth every 6 (six) hours as needed for moderate pain (pain score 4-6). McGowen, Philip H, MD  Active   insulin aspart (NOVOLOG) 100 UNIT/ML injection [id number] Yes Inject 10 Units into the skin 2 (two) times daily with a meal. Per endocrinologist [provider]  Active   Insulin Glargine  [facility]) 100 UNIT/ML [id number] Yes 10 units every morning and 24 units nightly Averneni, Madhavi, MD  Active   losartan  (COZAAR ) 100 MG tablet [id number] Yes Take 1 tablet (100 mg total) by mouth daily. McGowen, Philip H, MD  Active   meloxicam  (MOBIC ) 15 MG tablet [id number] Yes Take 1 tablet daily as needed. Christopher Savannah, PA-C  Active   metFORMIN  (GLUCOPHAGE ) 1000 MG tablet [id number] Yes Take 1 tablet (1,000 mg total) by mouth 2 (two) times daily with a meal. McGowen, Aleene DEL, MD  Active   metoCLOPramide  (REGLAN ) 5 MG tablet [id number]  TAKE 1 TABLET BY MOUTH 4 TIMES DAILY BEFORE MEAL(S) AND AT BEDTIME  Patient not taking: Reported on 11/28/2023   McGowen, Philip H, MD  Active   MOUNJARO 12.5 MG/0.5ML Pen [id number] Yes Inject 12.5 mg into the skin once a week. [provider]  Active   Multiple Vitamin (MULTIVITAMIN WITH MINERALS) TABS tablet [id number]  Take 1 tablet by mouth daily.  Patient not taking: Reported on 11/28/2023   [provider]  Active Self  NOVOLOG FLEXPEN 100 UNIT/ML FlexPen [id number]    Patient not taking: Reported on 11/28/2023   [provider]  Active   pantoprazole  (PROTONIX ) 40 MG tablet [id number] Yes Take 1 tablet (40 mg total) by  mouth daily. McGowen, Philip H, MD  Active   promethazine  (PHENERGAN ) 12.5 MG tablet [id number] Yes TAKE 1 TO 2 TABLETS BY MOUTH EVERY 6 HOURS AS NEEDED FOR NAUSEA  Patient taking differently: TAKE 1 TO 2 TABLETS BY MOUTH EVERY 6 HOURS AS NEEDED FOR NAUSEA   McGowen, Aleene DEL, MD  Active   rosuvastatin  (CRESTOR ) 40 MG tablet [id number] Yes Take 1 tablet (40 mg total) by mouth daily.  Patient taking  differently: Take 1 tablet (40 mg total) by mouth daily.   McGowen, Philip H, MD  Active   verapamil  (CALAN -SR) 240 MG CR tablet [id number] Yes Take by mouth. [provider]  Active             Home Care and Equipment/Supplies: Were Home Health Services Ordered?: No Any new equipment or medical supplies ordered?: No  Functional Questionnaire: Do you need assistance with bathing/showering or dressing?: Yes (husband) Do you need assistance with meal preparation?: No Do you need assistance with eating?: No Do you have difficulty maintaining continence: No Do you need assistance with getting out of bed/getting out of a chair/moving?: No Do you have difficulty managing or taking your medications?: No  Follow up appointments reviewed: PCP Follow-up appointment confirmed?: Yes Date of PCP follow-up appointment?: 11/26/23 Follow-up Provider: PCP Specialist Hospital Follow-up appointment confirmed?: Yes Date of Specialist follow-up appointment?: 12/07/23 Follow-Up Specialty Provider:: eye Do you need transportation to your follow-up appointment?: Yes (husband drives to appointments) Do you understand care options if your condition(s) worsen?: Yes-patient verbalized understanding  SDOH Interventions Today    Flowsheet Row Most Recent Value  SDOH Interventions   Food Insecurity Interventions Intervention Not Indicated  Housing Interventions Intervention Not Indicated  Transportation Interventions Intervention Not Indicated  Utilities Interventions Intervention Not Indicated   Today's Vitals   11/28/23 1110 11/28/23 1114  BP: 135/80   Pulse: 90   Weight: 161 lb (73 kg)   PainSc: 4  4     Goals Addressed             This Visit's Progress    [facility] Transitions of Care (TOC) Care Plan       Problems:  Recent Hospitalization for treatment of unintentional overdose of medications.  Goal:  Over the next 30 days, the patient will not experience hospital  readmission  Interventions:  Transitions of Care: Doctor Visits  - discussed the importance of doctor visits Reviewed all medications and patient is taking medications correctly at this time Reviewed Vital signs Reviewed use of a medication dispensing machine to help with remembering a person has already taken a medication. Reviewed and offered 30 day TOC program and patient has consented.  Scheduled next appointment and provided my contact information. Reviewed importance of notifying MD for any changes in condition.   Patient Self Care Activities:  Attend all scheduled provider appointments Call pharmacy for medication refills 3-7 days in advance of running out of medications Call provider office for new concerns or questions  Notify RN Care Manager of TOC call rescheduling needs Participate in Transition of Care Program/Attend TOC scheduled calls Take medications as prescribed    Plan:  Telephone follow up appointment with care management team member scheduled for:  12/05/2023 at 1100 The patient has been provided with contact information for the care management team and has been advised to call with any health related questions or concerns.        This note sent to PCP.   Alan Ee, RN, BSN, CEN  [facility]- Transition of Care Team.  [facility] [phone]

## 2023-11-29 DIAGNOSIS — Z1231 Encounter for screening mammogram for malignant neoplasm of breast: Secondary | ICD-10-CM | POA: Diagnosis not present

## 2023-12-03 NOTE — Progress Notes (Shared)
 [facility] - Clinic Note  12/07/2023     CHIEF COMPLAINT Patient presents for No chief complaint on file.  HISTORY OF PRESENT ILLNESS: Gloria Gomez is a 53 y.o. female who presents to the clinic today for:   Pt states she had a fall, fractured right foot. VA is stable.   Referring physician:   HISTORICAL INFORMATION:   Selected notes from the MEDICAL RECORD NUMBER Referred for DM exam   CURRENT MEDICATIONS: No current outpatient medications on file. (Ophthalmic Drugs)   No current facility-administered medications for this visit. (Ophthalmic Drugs)   Current Outpatient Medications (Other)  Medication Sig   Continuous Blood Gluc Sensor (FREESTYLE LIBRE 3 SENSOR) MISC by Does not apply route every 14 (fourteen) days.   FARXIGA 10 MG TABS tablet Take 10 mg by mouth daily.   furosemide  (LASIX ) 20 MG tablet TAKE 2 TABLETS BY MOUTH IN THE MORNING AND 1 IN THE EVENING (Patient taking differently: TAKE 2 TABLETS BY MOUTH IN THE MORNING AND 1 IN THE EVENING)   gabapentin  (NEURONTIN ) 300 MG capsule TAKE 1 CAPSULE BY MOUTH THREE TIMES DAILY (Patient taking differently: Take 300 mg by mouth 3 (three) times daily. Patient taking once a day)   HUMALOG KWIKPEN 100 UNIT/ML KwikPen Inject into the skin.   HYDROcodone -acetaminophen  (NORCO/VICODIN) 5-325 MG tablet Take 1-2 tablets by mouth every 6 (six) hours as needed for moderate pain (pain score 4-6).   insulin aspart (NOVOLOG) 100 UNIT/ML injection Inject 10 Units into the skin 2 (two) times daily with a meal. Per endocrinologist   Insulin Glargine  (BASAGLAR KWIKPEN) 100 UNIT/ML 10 units every morning and 24 units nightly   losartan  (COZAAR ) 100 MG tablet Take 1 tablet (100 mg total) by mouth daily.   meloxicam  (MOBIC ) 15 MG tablet Take 1 tablet daily as needed.   metFORMIN  (GLUCOPHAGE ) 1000 MG tablet Take 1 tablet (1,000 mg total) by mouth 2 (two) times daily with a meal.   metoCLOPramide  (REGLAN ) 5 MG tablet TAKE 1  TABLET BY MOUTH 4 TIMES DAILY BEFORE MEAL(S) AND AT BEDTIME (Patient not taking: Reported on 11/28/2023)   MOUNJARO 12.5 MG/0.5ML Pen Inject 12.5 mg into the skin once a week.   Multiple Vitamin (MULTIVITAMIN WITH MINERALS) TABS tablet Take 1 tablet by mouth daily. (Patient not taking: Reported on 11/28/2023)   NOVOLOG FLEXPEN 100 UNIT/ML FlexPen  (Patient not taking: Reported on 11/28/2023)   pantoprazole  (PROTONIX ) 40 MG tablet Take 1 tablet (40 mg total) by mouth daily.   promethazine  (PHENERGAN ) 12.5 MG tablet TAKE 1 TO 2 TABLETS BY MOUTH EVERY 6 HOURS AS NEEDED FOR NAUSEA (Patient taking differently: TAKE 1 TO 2 TABLETS BY MOUTH EVERY 6 HOURS AS NEEDED FOR NAUSEA)   rosuvastatin  (CRESTOR ) 40 MG tablet Take 1 tablet (40 mg total) by mouth daily. (Patient taking differently: Take 1 tablet (40 mg total) by mouth daily.)   verapamil  (CALAN -SR) 240 MG CR tablet Take by mouth.   No current facility-administered medications for this visit. (Other)   REVIEW OF SYSTEMS:   ALLERGIES Allergies  Allergen Reactions   Gluten Meal Swelling   Lisinopril  Cough   Pioglitazone  Other (See Comments)    ELEVATED glucoses + worse chronic nausea   PAST MEDICAL HISTORY Past Medical History:  Diagnosis Date   Abdominal bloating    likely from diab gastroparesis.  Dr. Leigh started trial of reglan  03/2019.   Benign brain tumor (HCC)    Cystic lesion in cerebral aqueduct region with mild  hydrocephalus-- stable MRI 02/2016.  Surveillance MRI 05/2017 --dilated cerebral aqueduct related to aqueductal stenosis and subsequent mild hydrocephalus (due to the 11 mm stable cystic lesion in cerebral aqueduct---?congenitial?.   Bilateral lower extremity edema    Cataract    OU   Dysmenorrhea    vicodin occ during first 2 days of cycle.   Fibromyalgia    Fibula fracture 07/2023   distal, nondisplaced (Right)   Gluten intolerance    pt reports she underwent full GI w/u to r/o celiac dz   Hepatic steatosis     ultrasound 08/2017. Hx of very mild elevation of ALT.  Stable on u/s 06/2020   History of adenomatous polyp of colon 04/08/2019   recall Feb 2024   Hyperlipidemia, mixed    Hypertension    +white coat component   Hypertensive retinopathy of both eyes    Insomnia    Iron deficiency 01/2019   Hb 11.3. Hemoccults neg x 3 03/19/19. EGD and colonoscopy 04/08/19 showed NO cause for IDA.  Pt does have menorrhagia, though, so she'll see her GYN.  started FeSO4 325 qd approx 04/14/19.   Menorrhagia    resulting in IDA 2021   Orthostatic hypotension    PMR (polymyalgia rheumatica) 2022   question of; hx of elevated ESR-->better with prednisone  but prednisone  was contraindicated d/t eye issues/hyperglycemia-->rheum started following her 02/2021->plaquenil 04/2021   Proliferative diabetic retinopathy of both eyes (HCC)    steroid injections 10/2017--improved   Sensorineural hearing loss of left ear    Sudden left hearing loss summer 2016--no improvement with steroids 01/2015 so brain MRI done by Dr. Carlie and it showed brain tumor that was determined to be benign.  Pt's hearing not bad enough for hearing aid as of 06/2016.   Type 2 diabetes with complication (HCC)    +microalbuminuria, diab retpthy, diabetic gastroparesis (gastric emptying study mildly abnl 03/2017).  Recommended lantus  08/2018 but pt declined. Mild microalbuminuria.   Uterine fibroid 2022   per pt report, pelvic u/s in GYN office   Past Surgical History:  Procedure Laterality Date   ANOSCOPY  05/12/2019   Procedure: normal exam, minimal hemorrhoid disease. Hyertrophied anal papila, benign appearing, posterior midline. Surgeon: Bernarda Ned MD   CHOLECYSTECTOMY  2000   COLONOSCOPY  04/08/2019   5 adenomas, recall 3 yrs; no cause for IDA found.  Hypertrophied anal papillae->bx showed low grade dysplasia; GI referred her to colorectal surgeon.   ESOPHAGOGASTRODUODENOSCOPY  04/08/2019   mild chronic reactive gastritis. H pylori NEG.  No cause  for IDA found.   GAS INSERTION Right 10/31/2018   Procedure: Insertion Of C3F8 Gas;  Surgeon: Valdemar Rogue, MD;  Location: [facility] OR;  Service: Ophthalmology;  Laterality: Right;   GASTRIC EMPTYING SCAN  04/20/2017   Mildly abnormal, particularly the 1st hour of emptying.   MEMBRANE PEEL Right 10/31/2018   Procedure: MEMBRANE PEEL;  Surgeon: Valdemar Rogue, MD;  Location: [facility] OR;  Service: Ophthalmology;  Laterality: Right;   PARS PLANA VITRECTOMY Right 04/12/2018   Procedure: Right PARS PLANA VITRECTOMY WITH 25 GAUGE with intravitreal antibiotics;  Surgeon: Valdemar Rogue, MD;  Location: [facility] OR;  Service: Ophthalmology;  Laterality: Right;   PARS PLANA VITRECTOMY Right 10/31/2018   Procedure: PARS PLANA VITRECTOMY WITH 25 GAUGE;  Surgeon: Valdemar Rogue, MD;  Location: [facility] OR;  Service: Ophthalmology;  Laterality: Right;   PHOTOCOAGULATION WITH LASER Right 10/31/2018   Procedure: Photocoagulation With Laser;  Surgeon: Valdemar Rogue, MD;  Location: [facility] OR;  Service: Ophthalmology;  Laterality: Right;   TRANSTHORACIC ECHOCARDIOGRAM  08/23/2020   Grd I DD, o/w normal.   FAMILY HISTORY Family History  Problem Relation Age of Onset   Brain cancer Mother    Diabetes Father    Diabetes Maternal Grandmother    Cataracts Maternal Grandmother    Cervical cancer Paternal Grandmother    Colon cancer Maternal Grandfather 9   Amblyopia Neg Hx    Blindness Neg Hx    Glaucoma Neg Hx    Macular degeneration Neg Hx    Retinal detachment Neg Hx    Strabismus Neg Hx    Retinitis pigmentosa Neg Hx    Esophageal cancer Neg Hx    Stomach cancer Neg Hx    Rectal cancer Neg Hx    SOCIAL HISTORY Social History   Tobacco Use   Smoking status: Never   Smokeless tobacco: Never  Vaping Use   Vaping status: Never Used  Substance Use Topics   Alcohol use: No   Drug use: No       OPHTHALMIC EXAM:  Not recorded    IMAGING AND PROCEDURES  Imaging and Procedures for           ASSESSMENT/PLAN: No  diagnosis found.  1-4.  Proliferative diabetic retinopathy w/ DME, OU - HbA1c 7.9 (10.08.24),8.6 (06.11.24), 7.1 (3.23.23), 8.6 (9.14.22), 7.8 (06.13.22) - s/p IVA OD #1 9.20.19, #2 (10.25.19), #3 (11.15.19), #4 (12.17.19), #5 (01.14.20), #6 (2.11.20), #7 (05.29.20), #8 (08.19.20), #9 (10.30.20), #10 (12.09.20), #11 (01.11.21), #12 (02.15.21), #13 (03.23.21), #14 (04.20.21), #15 (06.08.21), #16 (07.06.21) -- IVA resistance - s/p IVA OS #1 9.27.19, #2 (10.25.19), #3 (11.15.19), #4 (12.17.19), #5 (01.14.20), #6 (2.11.20), #7 (04.26.20), #8 (05.29.20), #9 (06.26.20), #10 (08.05.20), #11 (11.11.20), #12 (12.09.20), #13 (01.11.21), #14 (02.15.21), #15 (03.23.21), #16 (04.20.21), #17 (06.08.21) -- IVA resistance, #18 (09.27.21) ===================== - s/p IVE OD #1 (08.03.21) -- sample, #2 (09.10.21), #3 (10.08.21), #4 (11.23.21), #5 (12.21.21), #6 (01.19.22) sample, #7 (2.16.22), #8 (3.22.22), #9 (04.19.22), #10 (05.27.22), #11 (06.29.22), #12 (08.03.22), #13 (09.07.22), #14 (10.10.22), #15 (11.14.22), #16 (12.12.22), #17 (01.10.23) - sample, #18 (02.07.23), #19 (03.21.23), #20 (04.18.23), #21 (05.16.23) -- IVE resistance ===================== - s/p IVE OS #1 (10.25.21), #2 (11.23.21), #3 (12.21.21), #4 (3.22.22), #5 (06.29.22), #6 (08.03.22), #7 (09.07.22), #8 (10.10.22), #9 (11.14.22), #10 (02.07.23), #11 (05.16.23), #12 (08.15.23), #13 (11.14.23), #14 (02.06.24), #15 (05.06.24), #16 SAMPLE (07.01.24), #17 (09.26.24), #18 (12.20.24) #19 (03.14.25) ===================== - IVV OS #1 (PAP med-06.27.25)~q20months. ===================== - IVV OD #1 (06.16.23 -- sample), #2 (07.17.23), #3 (08.15.23), #4 (09.12.23), #5 (10.10.23), #6 (11.14.23), #7 (12.12.23), #8 (01.09.23), #9 (02.06.24), #10 (03.11.24), #11 (04.08.24), #12 (05.06.24), #13 (06.03.24), #14 (07.01.24), #15 (07.29.24), #16 (08.26.24)- IVV resistance #17(09.05.2025) PAP  ====================== - s/p IVE HD OD #1 (sample 09.26.24), #2 (10.25.24), #3  (11.21.24), #4 (12.20.24), #5 (01.17.25 -- sample), #6 (02.14.25 sample), #7 (03.14.25 -- sample)  #9 (04.11.25 -- sample) #10 (05.23.25 - sample), #11 (06.27.25 -- SAMPLE) #12(08.01.25) sample   - S/P PRP OS (09.20.19), (5.19.20), (08.19.20), (04.28.21), (02.02.22) - S/P PRP OD (9.27.19 and 11.21.19), fill-in (04.14.20) (09.03.20, surgery)  - S/P focal laser OS (07.06.21), OD (09.20.22) - FA (9.20.19) shows +NVE OU and leaking MA and capillary nonperfusion  - repeat FA 11.15.19 shows NV regressing OU - pre-op: OD w/ VA down at 20/25, but there is some preretinal fibrosis / tractional membranes just superior to disc and mild central DME - s/p 25g PPV+MP+10% C3F8 gas OD (09.03.20) -- ERM/PRF removal OD  - BCVA: OD 20/25 (stable) OS 20/25             -  fibrosis/ERM stably improved; retina attached - OCT shows OD: Persistent IRF/cystic changes temporal fovea--slightly improved; OS: Stable improvement in IRF/cystic changes temporal fovea,  stable improvement in vitreous opacities at 5 wks - recommend IVV OD #18 (PAP) and will hold IVV OS again today, 10.10.25 with follow up in 5 weeks again - [employer] funding unavailable **OS on ~q73m maintenance treatment schedule -- due again ~October**  - RBA of procedure discussed, questions answered - see procedure note  - [facility]  informed consent form re-signed and scanned on 05.16.2023  - [facility]  informed consent form signed on 06.16.23 - [facility]  HD informed consent obtained, resigned, and scanned on 10.25.24  - [facility]  HD approved for 2024 -- but no funding for [employer]  - f/u 5 weeks, DFE, OCT, possible injection(s)  5. Vitreous hemorrhage OS -- stably improved  - recurrent VH, onset 09.22.21  - etiology: secondary to PDR as described above (no RT/RD on exam)  - s/p PRP OS (9.20.19), (05.19.20), (08.19.20), (04.28.21), (02.02.22) - s/p IVA OS on 4.26.20, 5.29.20, 6.26.20, 8.5.20,11.11.20, 12.06.20 and so on as above   - Vit condensations stably improved  -  BCVA stable at 20/25  - IVEs as above  - f/u 5 weeks DFE, OCT  6. History of Endophthalmitis OD  - s/p IVA OU 04/09/2018  - s/p 25g PPV w/ intravitreal vanc, ceftaz and cefepime  OD, 2.14.2020  - s/p intravitreal tap / vanc and ceflaz injections (02.16.20)             - gram stain (2.14.20) shows G+ cocci, WBCs mostly PMNs;   - repeat gram stain from t/i (2.16.20) -- no organisms, just WBCs  - cultures from vitreous grew rare Staph warneri; cultures from t/i -- no growth             - doing well, BCVA 20/25             - inflammation/posterior debris resolved  - monitor  7. History of Vitreous Hemorrhage OD -- cleared from PPV x2 for endophthalmitis and ERM/preretinal fibrosis   - secondary to PDR  8,9. Hypertensive retinopathy OU  - discussed importance of tight BP control.  - monitor  10. Combined form age related cataract OS - The symptoms of cataract, surgical options, and treatments and risks were discussed with patient.   - discussed diagnosis and progression  11. Pseudophakia OD  - s/p CE/IOL OD (Dr. CANDIE Gaudy, 12.11.20)  - beautiful surgery, doing well  Ophthalmic Meds Ordered this visit:  No orders of the defined types were placed in this encounter.    No follow-ups on file.  There are no Patient Instructions on file for this visit.  This document serves as a record of services personally performed by Redell JUDITHANN Hans, MD, PhD. It was created on their behalf by Delon Newness COT, an ophthalmic technician. The creation of this record is the provider's dictation and/or activities during the visit.    Electronically signed by: Delon Newness COT 10.06.25  10:44 AM   Abbreviations: M myopia (nearsighted); A astigmatism; H hyperopia (farsighted); P presbyopia; Mrx spectacle prescription;  CTL contact lenses; OD right eye; OS left eye; OU both eyes  XT exotropia; ET esotropia; PEK punctate epithelial keratitis; PEE punctate epithelial erosions; DES dry eye  syndrome; MGD meibomian gland dysfunction; ATs artificial tears; PFAT's preservative free artificial tears; NSC nuclear sclerotic cataract; PSC posterior subcapsular cataract; ERM epi-retinal membrane; PVD posterior vitreous detachment; RD retinal detachment; DM diabetes mellitus; DR diabetic retinopathy; NPDR  non-proliferative diabetic retinopathy; PDR proliferative diabetic retinopathy; CSME clinically significant macular edema; DME diabetic macular edema; dbh dot blot hemorrhages; CWS cotton wool spot; POAG primary open angle glaucoma; C/D cup-to-disc ratio; HVF humphrey visual field; GVF goldmann visual field; OCT optical coherence tomography; IOP intraocular pressure; BRVO Branch retinal vein occlusion; CRVO central retinal vein occlusion; CRAO central retinal artery occlusion; BRAO branch retinal artery occlusion; RT retinal tear; SB scleral buckle; PPV pars plana vitrectomy; VH Vitreous hemorrhage; PRP panretinal laser photocoagulation; IVK intravitreal kenalog ; VMT vitreomacular traction; MH Macular hole;  NVD neovascularization of the disc; NVE neovascularization elsewhere; AREDS age related eye disease study; ARMD age related macular degeneration; POAG primary open angle glaucoma; EBMD epithelial/anterior basement membrane dystrophy; ACIOL anterior chamber intraocular lens; IOL intraocular lens; PCIOL posterior chamber intraocular lens; Phaco/IOL phacoemulsification with intraocular lens placement; PRK photorefractive keratectomy; LASIK laser assisted in situ keratomileusis; HTN hypertension; DM diabetes mellitus; COPD chronic obstructive pulmonary disease

## 2023-12-05 ENCOUNTER — Telehealth: Payer: Self-pay

## 2023-12-05 DIAGNOSIS — I10 Essential (primary) hypertension: Secondary | ICD-10-CM | POA: Diagnosis not present

## 2023-12-05 DIAGNOSIS — N183 Chronic kidney disease, stage 3 unspecified: Secondary | ICD-10-CM | POA: Diagnosis not present

## 2023-12-05 DIAGNOSIS — E785 Hyperlipidemia, unspecified: Secondary | ICD-10-CM | POA: Diagnosis not present

## 2023-12-05 DIAGNOSIS — E1165 Type 2 diabetes mellitus with hyperglycemia: Secondary | ICD-10-CM | POA: Diagnosis not present

## 2023-12-07 ENCOUNTER — Encounter (INDEPENDENT_AMBULATORY_CARE_PROVIDER_SITE_OTHER): Admitting: Ophthalmology

## 2023-12-07 DIAGNOSIS — Z7985 Long-term (current) use of injectable non-insulin antidiabetic drugs: Secondary | ICD-10-CM

## 2023-12-07 DIAGNOSIS — I10 Essential (primary) hypertension: Secondary | ICD-10-CM

## 2023-12-07 DIAGNOSIS — Z7984 Long term (current) use of oral hypoglycemic drugs: Secondary | ICD-10-CM

## 2023-12-07 DIAGNOSIS — E113513 Type 2 diabetes mellitus with proliferative diabetic retinopathy with macular edema, bilateral: Secondary | ICD-10-CM

## 2023-12-07 DIAGNOSIS — H4311 Vitreous hemorrhage, right eye: Secondary | ICD-10-CM

## 2023-12-07 DIAGNOSIS — H4312 Vitreous hemorrhage, left eye: Secondary | ICD-10-CM

## 2023-12-07 DIAGNOSIS — H44001 Unspecified purulent endophthalmitis, right eye: Secondary | ICD-10-CM

## 2023-12-07 DIAGNOSIS — H25812 Combined forms of age-related cataract, left eye: Secondary | ICD-10-CM

## 2023-12-07 DIAGNOSIS — Z961 Presence of intraocular lens: Secondary | ICD-10-CM

## 2023-12-07 DIAGNOSIS — H35033 Hypertensive retinopathy, bilateral: Secondary | ICD-10-CM

## 2023-12-07 DIAGNOSIS — Z794 Long term (current) use of insulin: Secondary | ICD-10-CM

## 2023-12-13 NOTE — Progress Notes (Signed)
 [facility] - Clinic Note  12/17/2023     CHIEF COMPLAINT Patient presents for Retina Follow Up  HISTORY OF PRESENT ILLNESS: Gloria Gomez is a 53 y.o. female who presents to the clinic today for:  HPI     Retina Follow Up   Patient presents with  Diabetic Retinopathy.  In both eyes.  Severity is moderate.  Duration of 11 weeks.  Since onset it is stable.  I, the attending physician,  performed the HPI with the patient and updated documentation appropriately.        Comments   Pt here for 11+ week (delayed from 5) f/u for PDR OU. Pt states she's been in the hospital due to taking two extra blood pressure pills. She was in intensive care. She's doing better. Vision isnt as strong. Pts meds have been adjusted, no longer on insulin. Just taking Monjauro, Trujeo, Farxiga. A1C was 6.9.      Last edited by Valdemar Rogue, MD on 12/17/2023 11:46 AM.     Pt states the vision seems about the same.   Referring physician:   HISTORICAL INFORMATION:   Selected notes from the MEDICAL RECORD NUMBER Referred for DM exam   CURRENT MEDICATIONS: No current outpatient medications on file. (Ophthalmic Drugs)   No current facility-administered medications for this visit. (Ophthalmic Drugs)   Current Outpatient Medications (Other)  Medication Sig   Continuous Blood Gluc Sensor (FREESTYLE LIBRE 3 SENSOR) MISC by Does not apply route every 14 (fourteen) days.   FARXIGA 10 MG TABS tablet Take 10 mg by mouth daily.   furosemide  (LASIX ) 20 MG tablet TAKE 2 TABLETS BY MOUTH IN THE MORNING AND 1 IN THE EVENING   gabapentin  (NEURONTIN ) 300 MG capsule TAKE 1 CAPSULE BY MOUTH THREE TIMES DAILY (Patient taking differently: Take 300 mg by mouth 3 (three) times daily. Patient taking once a day)   HUMALOG KWIKPEN 100 UNIT/ML KwikPen Inject into the skin.   HYDROcodone -acetaminophen  (NORCO/VICODIN) 5-325 MG tablet Take 1-2 tablets by mouth every 6 (six) hours as needed for moderate  pain (pain score 4-6).   losartan  (COZAAR ) 100 MG tablet Take 1 tablet (100 mg total) by mouth daily.   meloxicam  (MOBIC ) 15 MG tablet Take 1 tablet daily as needed.   metFORMIN  (GLUCOPHAGE ) 1000 MG tablet Take 1 tablet (1,000 mg total) by mouth 2 (two) times daily with a meal.   MOUNJARO 12.5 MG/0.5ML Pen Inject 12.5 mg into the skin once a week.   pantoprazole  (PROTONIX ) 40 MG tablet Take 1 tablet (40 mg total) by mouth daily.   promethazine  (PHENERGAN ) 12.5 MG tablet TAKE 1 TO 2 TABLETS BY MOUTH EVERY 6 HOURS AS NEEDED FOR NAUSEA (Patient taking differently: TAKE 1 TO 2 TABLETS BY MOUTH EVERY 6 HOURS AS NEEDED FOR NAUSEA)   rosuvastatin  (CRESTOR ) 40 MG tablet Take 1 tablet (40 mg total) by mouth daily. (Patient taking differently: Take 1 tablet (40 mg total) by mouth daily.)   verapamil  (CALAN -SR) 240 MG CR tablet Take by mouth.   insulin aspart (NOVOLOG) 100 UNIT/ML injection Inject 10 Units into the skin 2 (two) times daily with a meal. Per endocrinologist (Patient not taking: Reported on 12/17/2023)   Insulin Glargine  (BASAGLAR KWIKPEN) 100 UNIT/ML 10 units every morning and 24 units nightly (Patient not taking: Reported on 12/17/2023)   metoCLOPramide  (REGLAN ) 5 MG tablet TAKE 1 TABLET BY MOUTH 4 TIMES DAILY BEFORE MEAL(S) AND AT BEDTIME (Patient not taking: Reported on 12/17/2023)   Multiple  Vitamin (MULTIVITAMIN WITH MINERALS) TABS tablet Take 1 tablet by mouth daily. (Patient not taking: Reported on 12/17/2023)   NOVOLOG FLEXPEN 100 UNIT/ML FlexPen  (Patient not taking: Reported on 12/17/2023)   No current facility-administered medications for this visit. (Other)   REVIEW OF SYSTEMS: ROS   Positive for: Endocrine, Eyes Negative for: Constitutional, Gastrointestinal, Neurological, Skin, Genitourinary, Musculoskeletal, HENT, Cardiovascular, Respiratory, Psychiatric, Allergic/Imm, Heme/Lymph Last edited by Antonetta Almetta BRAVO, COT on 12/17/2023  9:36 AM.      ALLERGIES Allergies   Allergen Reactions   Gluten Meal Swelling   Lisinopril  Cough   Pioglitazone  Other (See Comments)    ELEVATED glucoses + worse chronic nausea   PAST MEDICAL HISTORY Past Medical History:  Diagnosis Date   Abdominal bloating    likely from diab gastroparesis.  Dr. Leigh started trial of reglan  03/2019.   Benign brain tumor (HCC)    Cystic lesion in cerebral aqueduct region with mild hydrocephalus-- stable MRI 02/2016.  Surveillance MRI 05/2017 --dilated cerebral aqueduct related to aqueductal stenosis and subsequent mild hydrocephalus (due to the 11 mm stable cystic lesion in cerebral aqueduct---?congenitial?.   Bilateral lower extremity edema    Cataract    OU   Dysmenorrhea    vicodin occ during first 2 days of cycle.   Fibromyalgia    Fibula fracture 07/2023   distal, nondisplaced (Right)   Gluten intolerance    pt reports she underwent full GI w/u to r/o celiac dz   Hepatic steatosis    ultrasound 08/2017. Hx of very mild elevation of ALT.  Stable on u/s 06/2020   History of adenomatous polyp of colon 04/08/2019   recall Feb 2024   Hyperlipidemia, mixed    Hypertension    +white coat component   Hypertensive retinopathy of both eyes    Insomnia    Iron deficiency 01/2019   Hb 11.3. Hemoccults neg x 3 03/19/19. EGD and colonoscopy 04/08/19 showed NO cause for IDA.  Pt does have menorrhagia, though, so she'll see her GYN.  started FeSO4 325 qd approx 04/14/19.   Menorrhagia    resulting in IDA 2021   Orthostatic hypotension    PMR (polymyalgia rheumatica) 2022   question of; hx of elevated ESR-->better with prednisone  but prednisone  was contraindicated d/t eye issues/hyperglycemia-->rheum started following her 02/2021->plaquenil 04/2021   Proliferative diabetic retinopathy of both eyes (HCC)    steroid injections 10/2017--improved   Sensorineural hearing loss of left ear    Sudden left hearing loss summer 2016--no improvement with steroids 01/2015 so brain MRI done by Dr. Carlie  and it showed brain tumor that was determined to be benign.  Pt's hearing not bad enough for hearing aid as of 06/2016.   Type 2 diabetes with complication (HCC)    +microalbuminuria, diab retpthy, diabetic gastroparesis (gastric emptying study mildly abnl 03/2017).  Recommended lantus  08/2018 but pt declined. Mild microalbuminuria.   Uterine fibroid 2022   per pt report, pelvic u/s in GYN office   Past Surgical History:  Procedure Laterality Date   ANOSCOPY  05/12/2019   Procedure: normal exam, minimal hemorrhoid disease. Hyertrophied anal papila, benign appearing, posterior midline. Surgeon: Bernarda Ned MD   CHOLECYSTECTOMY  2000   COLONOSCOPY  04/08/2019   5 adenomas, recall 3 yrs; no cause for IDA found.  Hypertrophied anal papillae->bx showed low grade dysplasia; GI referred her to colorectal surgeon.   ESOPHAGOGASTRODUODENOSCOPY  04/08/2019   mild chronic reactive gastritis. H pylori NEG.  No cause for IDA found.  GAS INSERTION Right 10/31/2018   Procedure: Insertion Of C3F8 Gas;  Surgeon: Valdemar Rogue, MD;  Location: [facility] OR;  Service: Ophthalmology;  Laterality: Right;   GASTRIC EMPTYING SCAN  04/20/2017   Mildly abnormal, particularly the 1st hour of emptying.   MEMBRANE PEEL Right 10/31/2018   Procedure: MEMBRANE PEEL;  Surgeon: Valdemar Rogue, MD;  Location: [facility] OR;  Service: Ophthalmology;  Laterality: Right;   PARS PLANA VITRECTOMY Right 04/12/2018   Procedure: Right PARS PLANA VITRECTOMY WITH 25 GAUGE with intravitreal antibiotics;  Surgeon: Valdemar Rogue, MD;  Location: [facility] OR;  Service: Ophthalmology;  Laterality: Right;   PARS PLANA VITRECTOMY Right 10/31/2018   Procedure: PARS PLANA VITRECTOMY WITH 25 GAUGE;  Surgeon: Valdemar Rogue, MD;  Location: [facility] OR;  Service: Ophthalmology;  Laterality: Right;   PHOTOCOAGULATION WITH LASER Right 10/31/2018   Procedure: Photocoagulation With Laser;  Surgeon: Valdemar Rogue, MD;  Location: [facility] OR;  Service: Ophthalmology;  Laterality: Right;    TRANSTHORACIC ECHOCARDIOGRAM  08/23/2020   Grd I DD, o/w normal.   FAMILY HISTORY Family History  Problem Relation Age of Onset   Brain cancer Mother    Diabetes Father    Diabetes Maternal Grandmother    Cataracts Maternal Grandmother    Cervical cancer Paternal Grandmother    Colon cancer Maternal Grandfather 71   Amblyopia Neg Hx    Blindness Neg Hx    Glaucoma Neg Hx    Macular degeneration Neg Hx    Retinal detachment Neg Hx    Strabismus Neg Hx    Retinitis pigmentosa Neg Hx    Esophageal cancer Neg Hx    Stomach cancer Neg Hx    Rectal cancer Neg Hx    SOCIAL HISTORY Social History   Tobacco Use   Smoking status: Never   Smokeless tobacco: Never  Vaping Use   Vaping status: Never Used  Substance Use Topics   Alcohol use: No   Drug use: No       OPHTHALMIC EXAM:  Base Eye Exam     Visual Acuity (Snellen - Linear)       Right Left   Dist Blaine 20/25 -2    Dist cc  20/40   Dist ph  NI    Dist ph cc  20/30    Correction: Glasses         Tonometry (Tonopen, 9:50 AM)       Right Left   Pressure 13 14         Pupils       Pupils Dark Light Shape React APD   Right PERRL 3 2 Round Brisk None   Left PERRL 3 2 Round Brisk None         Visual Fields (Counting fingers)       Left Right    Full Full         Extraocular Movement       Right Left    Full, Ortho Full, Ortho         Neuro/Psych     Oriented x3: Yes   Mood/Affect: Normal         Dilation     Both eyes: 1.0% Mydriacyl , 2.5% Phenylephrine  @ 9:51 AM           Slit Lamp and Fundus Exam     Slit Lamp Exam       Right Left   Lids/Lashes Dermatochalasis - upper lid, mild Meibomian gland dysfunction, Telangiectasia Dermatochalasis - upper lid, Meibomian gland  dysfunction, Telangiectasia   Conjunctiva/Sclera White and quiet White and quiet   Cornea Clear, well healed temporal cataract wounds Trace Punctate epithelial erosions, trace EBMD   Anterior Chamber  Deep and quiet Deep and quiet   Iris Round and dilated, No NVI Round and dilated, No NVI   Lens PC IOL in good position, 1-2+ Posterior capsular opacification, PC folds 2-3+ Nuclear sclerosis with mild brunescence, 2-3+ Cortical cataract, 1+ Posterior subcapsular cataract   Anterior Vitreous post vitrectomy, trace pigment Vitreous syneresis, vitreous condensations, no frank red heme, old white VH scattered         Fundus Exam       Right Left   Disc mild Pallor, Sharp rim, temporal PPA Pink and sharp, Compact, PPA   C/D Ratio 0.2 0.1   Macula Flat, good foveal reflex, mild cystic changes centrally--slightly increased, focal MA and exudates ST mac -- improved, +focal laser changes Flat, blunted foveal reflex, trace cystic changes, scattered MA; trace ERM, light focal laser changes   Vessels attenuated, Tortuous attenuated, Tortuous   Periphery Attached, rare MA, scattered DBH greatest posteriorly, 360 PRP, good laser fill in 360 attached, scattered IRH; 360 PRP scars -- with good fill in changes           Refraction     Wearing Rx       Sphere Cylinder   Right -0.25 Sphere   Left -4.50 Sphere           IMAGING AND PROCEDURES  Imaging and Procedures for   OCT, Retina - OU - Both Eyes       Right Eye Quality was good. Central Foveal Thickness: 342. Progression has worsened. Findings include no SRF, abnormal foveal contour, intraretinal hyper-reflective material, epiretinal membrane, intraretinal fluid (Persistent IRF/cystic changes temporal fovea--slightly increased).   Left Eye Quality was good. Central Foveal Thickness: 314. Progression has worsened. Findings include normal foveal contour, no SRF, intraretinal hyper-reflective material (Interval redevelopment in IRF/cystic changes temporal fovea, stable improvement in vitreous opacities).   Notes *Images captured and stored on drive  Diagnosis / Impression:  +DME OU OD: Persistent IRF/cystic changes temporal  fovea--slightly increased OS: Interval redevelopment in IRF/cystic changes temporal fovea,  stable improvement in vitreous opacities  Clinical management:  See below  Abbreviations: NFP - Normal foveal profile. CME - cystoid macular edema. PED - pigment epithelial detachment. IRF - intraretinal fluid. SRF - subretinal fluid. EZ - ellipsoid zone. ERM - epiretinal membrane. ORA - outer retinal atrophy. ORT - outer retinal tubulation. SRHM - subretinal hyper-reflective material       Intravitreal Injection, Pharmacologic Agent - OD - Right Eye       Time Out 12/17/2023. 10:22 AM. Confirmed correct patient, procedure, site, and patient consented.   Anesthesia Topical anesthesia was used. Anesthetic medications included Lidocaine  2%, Proparacaine  0.5%.   Procedure Preparation included 5% betadine to ocular surface, eyelid speculum. A supplied (32g) needle was used.   Injection: 6 mg faricimab -svoa 6 MG/0.05ML (Patient supplied)   Route: Intravitreal, Site: Right Eye   NDC: 49757-903-93, Lot: [id number], Expiration date: 11/26/2024, Waste: 0 mL   Post-op Post injection exam found visual acuity of at least counting fingers. The patient tolerated the procedure well. There were no complications. The patient received written and verbal post procedure care education.   Notes **PAP MEDICATION ADMINISTERED**     Intravitreal Injection, Pharmacologic Agent - OS - Left Eye       Time Out 12/17/2023. 10:23 AM. Confirmed correct patient, procedure,  site, and patient consented.   Anesthesia Topical anesthesia was used. Anesthetic medications included Lidocaine  2%, Proparacaine  0.5%.   Procedure Preparation included 5% betadine to ocular surface, eyelid speculum. A (32g) needle was used.   Injection: 6 mg faricimab -svoa 6 MG/0.05ML (Patient supplied)   Route: Intravitreal, Site: Left Eye   NDC: 49757-903-93, Lot: [id number], Expiration date: 11/26/2024, Waste: 0 mL   Post-op Post  injection exam found visual acuity of at least counting fingers. The patient tolerated the procedure well. There were no complications. The patient received written and verbal post procedure care education.   Notes **PAP MEDICATION ADMINISTERED**            ASSESSMENT/PLAN:   ICD-10-CM   1. Proliferative diabetic retinopathy of both eyes with macular edema associated with type 2 diabetes mellitus (HCC)  E11.3513 OCT, Retina - OU - Both Eyes    Intravitreal Injection, Pharmacologic Agent - OD - Right Eye    Intravitreal Injection, Pharmacologic Agent - OS - Left Eye    faricimab -svoa (VABYSMO ) 6mg /0.5mL intravitreal injection    faricimab -svoa (VABYSMO ) 6mg /0.43mL intravitreal injection    2. Long term (current) use of oral hypoglycemic drugs  Z79.84     3. Long-term (current) use of injectable non-insulin antidiabetic drugs  Z79.85     4. Vitreous hemorrhage of left eye (HCC)  H43.12     5. Vitreous hemorrhage, right eye (HCC)  H43.11     6. Hypertensive retinopathy of both eyes  H35.033     7. Essential hypertension  I10     8. Right endophthalmia  H44.001     9. Combined forms of age-related cataract of left eye  H25.812     10. Pseudophakia of right eye  Z96.1      **Delayed f/u - 6+ wks instead of 5 due to hospitalization**  1-4.  Proliferative diabetic retinopathy w/ DME, OU - HbA1c 6.9 (10.13.25), 7.1 (09.24.25), 7.9 (10.08.24), 8.6 (06.11.24) - s/p IVA OD #1 9.20.19, #2 (10.25.19), #3 (11.15.19), #4 (12.17.19), #5 (01.14.20), #6 (2.11.20), #7 (05.29.20), #8 (08.19.20), #9 (10.30.20), #10 (12.09.20), #11 (01.11.21), #12 (02.15.21), #13 (03.23.21), #14 (04.20.21), #15 (06.08.21), #16 (07.06.21) -- IVA resistance - s/p IVA OS #1 9.27.19, #2 (10.25.19), #3 (11.15.19), #4 (12.17.19), #5 (01.14.20), #6 (2.11.20), #7 (04.26.20), #8 (05.29.20), #9 (06.26.20), #10 (08.05.20), #11 (11.11.20), #12 (12.09.20), #13 (01.11.21), #14 (02.15.21), #15 (03.23.21), #16 (04.20.21), #17  (06.08.21) -- IVA resistance, #18 (09.27.21) ===================== - s/p IVE OD #1 (08.03.21) -- sample, #2 (09.10.21), #3 (10.08.21), #4 (11.23.21), #5 (12.21.21), #6 (01.19.22) sample, #7 (2.16.22), #8 (3.22.22), #9 (04.19.22), #10 (05.27.22), #11 (06.29.22), #12 (08.03.22), #13 (09.07.22), #14 (10.10.22), #15 (11.14.22), #16 (12.12.22), #17 (01.10.23) - sample, #18 (02.07.23), #19 (03.21.23), #20 (04.18.23), #21 (05.16.23) -- IVE resistance ===================== - s/p IVE OS #1 (10.25.21), #2 (11.23.21), #3 (12.21.21), #4 (3.22.22), #5 (06.29.22), #6 (08.03.22), #7 (09.07.22), #8 (10.10.22), #9 (11.14.22), #10 (02.07.23), #11 (05.16.23), #12 (08.15.23), #13 (11.14.23), #14 (02.06.24), #15 (05.06.24), #16 SAMPLE (07.01.24), #17 (09.26.24), #18 (12.20.24) #19 (03.14.25) ===================== - IVV OS #1 (PAP med-06.27.25) ~q3-8months. ===================== - IVV OD #1 (06.16.23 -- sample), #2 (07.17.23), #3 (08.15.23), #4 (09.12.23), #5 (10.10.23), #6 (11.14.23), #7 (12.12.23), #8 (01.09.23), #9 (02.06.24), #10 (03.11.24), #11 (04.08.24), #12 (05.06.24), #13 (06.03.24), #14 (07.01.24), #15 (07.29.24), #16 (08.26.24), #17 (09.05.25)- IVV resistance ====================== - s/p IVE HD OD #1 (sample 09.26.24), #2 (10.25.24), #3 (11.21.24), #4 (12.20.24), #5 (01.17.25 -- sample), #6 (02.14.25 sample), #7 (03.14.25 -- sample)  #9 (04.11.25 -- sample) #10 (05.23.25 - sample), #  11 (06.27.25 -- SAMPLE) #12 (08.01.25) sample  - S/P PRP OS (09.20.19), (5.19.20), (08.19.20), (04.28.21), (02.02.22) - S/P PRP OD (9.27.19 and 11.21.19), fill-in (04.14.20) (09.03.20, surgery)  - S/P focal laser OS (07.06.21), OD (09.20.22) - FA (9.20.19) shows +NVE OU and leaking MA and capillary nonperfusion  - repeat FA 11.15.19 shows NV regressing OU - pre-op: OD w/ VA down at 20/25, but there is some preretinal fibrosis / tractional membranes just superior to disc and mild central DME - s/p 25g PPV+MP+10% C3F8 gas OD  (09.03.20) -- ERM/PRF removal OD  - BCVA: OD 20/25 (stable) OS 20/30 from 20/25             - fibrosis/ERM stably improved; retina attached - OCT shows OD: Persistent IRF/cystic changes temporal fovea--slightly increased; OS: Interval redevelopment in IRF/cystic changes temporal fovea, stable improvement in vitreous opacities at 6+ wks - recommend IVV OD #18 (PAP) and IVV OS #2 today, 10.20.2025 with follow up in 4-5 weeks again - [employer] funding unavailable **OS on ~q100m maintenance treatment schedule -- due again ~Feb 2026**  - RBA of procedure discussed, questions answered - see procedure note  - Eylea  informed consent form re-signed and scanned on 05.16.2023  - Vabysmo  informed consent form signed on 06.16.23 - Eylea  HD informed consent obtained, resigned, and scanned on 10.25.24  - Eylea  HD approved for 2024 -- but no funding for [employer]  - f/u 5 weeks, DFE, OCT, possible injection(s)  5. Vitreous hemorrhage OS -- stably improved  - recurrent VH, onset 09.22.21 - etiology: secondary to PDR as described above (no RT/RD on exam)  - s/p PRP OS (9.20.19), (05.19.20), (08.19.20), (04.28.21), (02.02.22) - s/p IVA OS on 4.26.20, 5.29.20, 6.26.20, 8.5.20,11.11.20, 12.06.20 and so on as above   - Vit condensations stably improved  - BCVA stable at 20/25  - IVEs as above  - f/u 5 weeks DFE, OCT  6. History of Endophthalmitis OD  - s/p IVA OU 04/09/2018 - s/p 25g PPV w/ intravitreal vanc, ceftaz and cefepime  OD, 2.14.2020  - s/p intravitreal tap / vanc and ceflaz injections (02.16.20)             - gram stain (2.14.20) shows G+ cocci, WBCs mostly PMNs;   - repeat gram stain from t/i (2.16.20) -- no organisms, just WBCs  - cultures from vitreous grew rare Staph warneri; cultures from t/i -- no growth             - doing well, BCVA 20/25             - inflammation/posterior debris resolved  - monitor  7. History of Vitreous Hemorrhage OD -- cleared from PPV x2 for endophthalmitis and  ERM/preretinal fibrosis   - secondary to PDR  8,9. Hypertensive retinopathy OU  - discussed importance of tight BP control.  - monitor  10. Combined form age related cataract OS - The symptoms of cataract, surgical options, and treatments and risks were discussed with patient.   - discussed diagnosis and progression  11. Pseudophakia OD  - s/p CE/IOL OD (Dr. CANDIE Gaudy, 12.11.20)  - beautiful surgery, doing well  Ophthalmic Meds Ordered this visit:  Meds ordered this encounter  Medications   faricimab -svoa (VABYSMO ) 6mg /0.48mL intravitreal injection   faricimab -svoa (VABYSMO ) 6mg /0.67mL intravitreal injection     Return in about 5 weeks (around 01/21/2024) for f/u, PDR, DFE, OCT, Possible, IVV, PAP medication, OU.  There are no Patient Instructions on file for this visit.  This document  serves as a record of services personally performed by Redell JUDITHANN Hans, MD, PhD. It was created on their behalf by Avelina Pereyra, COA an ophthalmic technician. The creation of this record is the provider's dictation and/or activities during the visit.   Electronically signed by: Avelina GORMAN Pereyra, COT  12/17/23  11:47 AM   This document serves as a record of services personally performed by Redell JUDITHANN Hans, MD, PhD. It was created on their behalf by Wanda GEANNIE Keens, COT an ophthalmic technician. The creation of this record is the provider's dictation and/or activities during the visit.    Electronically signed by:  Wanda GEANNIE Keens, COT  12/17/23 11:47 AM  Redell JUDITHANN Hans, M.D., Ph.D. Diseases & Surgery of the Retina and Vitreous [facility]  I have reviewed the above documentation for accuracy and completeness, and I agree with the above. Redell JUDITHANN Hans, M.D., Ph.D. 12/17/23 11:51 AM   Abbreviations: M myopia (nearsighted); A astigmatism; H hyperopia (farsighted); P presbyopia; Mrx spectacle prescription;  CTL contact lenses; OD right eye; OS left eye; OU both eyes   XT exotropia; ET esotropia; PEK punctate epithelial keratitis; PEE punctate epithelial erosions; DES dry eye syndrome; MGD meibomian gland dysfunction; ATs artificial tears; PFAT's preservative free artificial tears; NSC nuclear sclerotic cataract; PSC posterior subcapsular cataract; ERM epi-retinal membrane; PVD posterior vitreous detachment; RD retinal detachment; DM diabetes mellitus; DR diabetic retinopathy; NPDR non-proliferative diabetic retinopathy; PDR proliferative diabetic retinopathy; CSME clinically significant macular edema; DME diabetic macular edema; dbh dot blot hemorrhages; CWS cotton wool spot; POAG primary open angle glaucoma; C/D cup-to-disc ratio; HVF humphrey visual field; GVF goldmann visual field; OCT optical coherence tomography; IOP intraocular pressure; BRVO Branch retinal vein occlusion; CRVO central retinal vein occlusion; CRAO central retinal artery occlusion; BRAO branch retinal artery occlusion; RT retinal tear; SB scleral buckle; PPV pars plana vitrectomy; VH Vitreous hemorrhage; PRP panretinal laser photocoagulation; IVK intravitreal kenalog ; VMT vitreomacular traction; MH Macular hole;  NVD neovascularization of the disc; NVE neovascularization elsewhere; AREDS age related eye disease study; ARMD age related macular degeneration; POAG primary open angle glaucoma; EBMD epithelial/anterior basement membrane dystrophy; ACIOL anterior chamber intraocular lens; IOL intraocular lens; PCIOL posterior chamber intraocular lens; Phaco/IOL phacoemulsification with intraocular lens placement; PRK photorefractive keratectomy; LASIK laser assisted in situ keratomileusis; HTN hypertension; DM diabetes mellitus; COPD chronic obstructive pulmonary disease

## 2023-12-17 ENCOUNTER — Ambulatory Visit (INDEPENDENT_AMBULATORY_CARE_PROVIDER_SITE_OTHER): Admitting: Ophthalmology

## 2023-12-17 ENCOUNTER — Encounter (INDEPENDENT_AMBULATORY_CARE_PROVIDER_SITE_OTHER): Payer: Self-pay | Admitting: Ophthalmology

## 2023-12-17 DIAGNOSIS — H4313 Vitreous hemorrhage, bilateral: Secondary | ICD-10-CM

## 2023-12-17 DIAGNOSIS — Z961 Presence of intraocular lens: Secondary | ICD-10-CM

## 2023-12-17 DIAGNOSIS — E113513 Type 2 diabetes mellitus with proliferative diabetic retinopathy with macular edema, bilateral: Secondary | ICD-10-CM

## 2023-12-17 DIAGNOSIS — H25812 Combined forms of age-related cataract, left eye: Secondary | ICD-10-CM

## 2023-12-17 DIAGNOSIS — Z7984 Long term (current) use of oral hypoglycemic drugs: Secondary | ICD-10-CM

## 2023-12-17 DIAGNOSIS — Z7985 Long-term (current) use of injectable non-insulin antidiabetic drugs: Secondary | ICD-10-CM | POA: Diagnosis not present

## 2023-12-17 DIAGNOSIS — I10 Essential (primary) hypertension: Secondary | ICD-10-CM

## 2023-12-17 DIAGNOSIS — H35033 Hypertensive retinopathy, bilateral: Secondary | ICD-10-CM

## 2023-12-17 DIAGNOSIS — H4312 Vitreous hemorrhage, left eye: Secondary | ICD-10-CM

## 2023-12-17 DIAGNOSIS — H44001 Unspecified purulent endophthalmitis, right eye: Secondary | ICD-10-CM

## 2023-12-17 DIAGNOSIS — H4311 Vitreous hemorrhage, right eye: Secondary | ICD-10-CM

## 2023-12-17 DIAGNOSIS — Z794 Long term (current) use of insulin: Secondary | ICD-10-CM

## 2023-12-17 MED ORDER — FARICIMAB-SVOA 6 MG/0.05ML IZ SOSY
6.0000 mg | PREFILLED_SYRINGE | INTRAVITREAL | Status: AC | PRN
  Administered 2023-12-17: 6 mg via INTRAVITREAL

## 2024-01-07 NOTE — Progress Notes (Signed)
 [facility] - Clinic Note  01/21/2024     CHIEF COMPLAINT Patient presents for Retina Follow Up  HISTORY OF PRESENT ILLNESS: Gloria Gomez is a 53 y.o. female who presents to the clinic today for:  HPI     Retina Follow Up   Patient presents with  Diabetic Retinopathy.  In both eyes.  Severity is moderate.  Duration of 5 weeks.  Since onset it is stable.  I, the attending physician,  performed the HPI with the patient and updated documentation appropriately.        Comments   Pt here for 5 wk ret f/u PDR OU. Pt states VA is stable. A1C in September 7.1.       Last edited by Valdemar Rogue, MD on 01/21/2024  1:52 PM.     Pt states the vision seems about the same.   Referring physician:   HISTORICAL INFORMATION:   Selected notes from the MEDICAL RECORD NUMBER Referred for DM exam   CURRENT MEDICATIONS: No current outpatient medications on file. (Ophthalmic Drugs)   No current facility-administered medications for this visit. (Ophthalmic Drugs)   Current Outpatient Medications (Other)  Medication Sig   Continuous Blood Gluc Sensor (FREESTYLE LIBRE 3 SENSOR) MISC by Does not apply route every 14 (fourteen) days.   FARXIGA 10 MG TABS tablet Take 10 mg by mouth daily.   furosemide  (LASIX ) 20 MG tablet TAKE 2 TABLETS BY MOUTH IN THE MORNING AND 1 IN THE EVENING   gabapentin  (NEURONTIN ) 300 MG capsule TAKE 1 CAPSULE BY MOUTH THREE TIMES DAILY (Patient taking differently: Take 300 mg by mouth 3 (three) times daily. Patient taking once a day)   HUMALOG KWIKPEN 100 UNIT/ML KwikPen Inject into the skin.   HYDROcodone -acetaminophen  (NORCO/VICODIN) 5-325 MG tablet Take 1-2 tablets by mouth every 6 (six) hours as needed for moderate pain (pain score 4-6).   losartan  (COZAAR ) 100 MG tablet Take 1 tablet (100 mg total) by mouth daily.   meloxicam  (MOBIC ) 15 MG tablet Take 1 tablet daily as needed.   metFORMIN  (GLUCOPHAGE ) 1000 MG tablet Take 1 tablet (1,000 mg  total) by mouth 2 (two) times daily with a meal.   MOUNJARO 12.5 MG/0.5ML Pen Inject 12.5 mg into the skin once a week.   pantoprazole  (PROTONIX ) 40 MG tablet Take 1 tablet (40 mg total) by mouth daily.   verapamil  (CALAN -SR) 240 MG CR tablet Take by mouth.   insulin aspart (NOVOLOG) 100 UNIT/ML injection Inject 10 Units into the skin 2 (two) times daily with a meal. Per endocrinologist (Patient not taking: Reported on 01/21/2024)   Insulin Glargine  (BASAGLAR KWIKPEN) 100 UNIT/ML 10 units every morning and 24 units nightly (Patient not taking: Reported on 01/21/2024)   metoCLOPramide  (REGLAN ) 5 MG tablet TAKE 1 TABLET BY MOUTH 4 TIMES DAILY BEFORE MEAL(S) AND AT BEDTIME (Patient not taking: Reported on 01/21/2024)   Multiple Vitamin (MULTIVITAMIN WITH MINERALS) TABS tablet Take 1 tablet by mouth daily. (Patient not taking: Reported on 01/21/2024)   NOVOLOG FLEXPEN 100 UNIT/ML FlexPen  (Patient not taking: Reported on 01/21/2024)   promethazine  (PHENERGAN ) 12.5 MG tablet TAKE 1 TO 2 TABLETS BY MOUTH EVERY 6 HOURS AS NEEDED FOR NAUSEA (Patient not taking: Reported on 01/21/2024)   rosuvastatin  (CRESTOR ) 40 MG tablet Take 1 tablet (40 mg total) by mouth daily. (Patient not taking: Reported on 01/21/2024)   No current facility-administered medications for this visit. (Other)   REVIEW OF SYSTEMS: ROS   Positive for: Endocrine,  Eyes Negative for: Constitutional, Gastrointestinal, Neurological, Skin, Genitourinary, Musculoskeletal, HENT, Cardiovascular, Respiratory, Psychiatric, Allergic/Imm, Heme/Lymph Last edited by Antonetta Almetta BRAVO, COT on 01/21/2024  1:10 PM.       ALLERGIES Allergies  Allergen Reactions   Gluten Meal Swelling   Lisinopril  Cough   Pioglitazone  Other (See Comments)    ELEVATED glucoses + worse chronic nausea   PAST MEDICAL HISTORY Past Medical History:  Diagnosis Date   Abdominal bloating    likely from diab gastroparesis.  Dr. Leigh started trial of reglan   03/2019.   Benign brain tumor (HCC)    Cystic lesion in cerebral aqueduct region with mild hydrocephalus-- stable MRI 02/2016.  Surveillance MRI 05/2017 --dilated cerebral aqueduct related to aqueductal stenosis and subsequent mild hydrocephalus (due to the 11 mm stable cystic lesion in cerebral aqueduct---?congenitial?.   Bilateral lower extremity edema    Cataract    OU   Dysmenorrhea    vicodin occ during first 2 days of cycle.   Fibromyalgia    Fibula fracture 07/2023   distal, nondisplaced (Right)   Gluten intolerance    pt reports she underwent full GI w/u to r/o celiac dz   Hepatic steatosis    ultrasound 08/2017. Hx of very mild elevation of ALT.  Stable on u/s 06/2020   History of adenomatous polyp of colon 04/08/2019   recall Feb 2024   Hyperlipidemia, mixed    Hypertension    +white coat component   Hypertensive retinopathy of both eyes    Insomnia    Iron deficiency 01/2019   Hb 11.3. Hemoccults neg x 3 03/19/19. EGD and colonoscopy 04/08/19 showed NO cause for IDA.  Pt does have menorrhagia, though, so she'll see her GYN.  started FeSO4 325 qd approx 04/14/19.   Menorrhagia    resulting in IDA 2021   Orthostatic hypotension    PMR (polymyalgia rheumatica) 2022   question of; hx of elevated ESR-->better with prednisone  but prednisone  was contraindicated d/t eye issues/hyperglycemia-->rheum started following her 02/2021->plaquenil 04/2021   Proliferative diabetic retinopathy of both eyes (HCC)    steroid injections 10/2017--improved   Sensorineural hearing loss of left ear    Sudden left hearing loss summer 2016--no improvement with steroids 01/2015 so brain MRI done by Dr. Carlie and it showed brain tumor that was determined to be benign.  Pt's hearing not bad enough for hearing aid as of 06/2016.   Type 2 diabetes with complication (HCC)    +microalbuminuria, diab retpthy, diabetic gastroparesis (gastric emptying study mildly abnl 03/2017).  Recommended lantus  08/2018 but pt declined.  Mild microalbuminuria.   Uterine fibroid 2022   per pt report, pelvic u/s in GYN office   Past Surgical History:  Procedure Laterality Date   ANOSCOPY  05/12/2019   Procedure: normal exam, minimal hemorrhoid disease. Hyertrophied anal papila, benign appearing, posterior midline. Surgeon: Bernarda Ned MD   CHOLECYSTECTOMY  2000   COLONOSCOPY  04/08/2019   5 adenomas, recall 3 yrs; no cause for IDA found.  Hypertrophied anal papillae->bx showed low grade dysplasia; GI referred her to colorectal surgeon.   ESOPHAGOGASTRODUODENOSCOPY  04/08/2019   mild chronic reactive gastritis. H pylori NEG.  No cause for IDA found.   GAS INSERTION Right 10/31/2018   Procedure: Insertion Of C3F8 Gas;  Surgeon: Valdemar Rogue, MD;  Location: [facility] OR;  Service: Ophthalmology;  Laterality: Right;   GASTRIC EMPTYING SCAN  04/20/2017   Mildly abnormal, particularly the 1st hour of emptying.   MEMBRANE PEEL Right 10/31/2018  Procedure: MEMBRANE PEEL;  Surgeon: Valdemar Rogue, MD;  Location: [facility] OR;  Service: Ophthalmology;  Laterality: Right;   PARS PLANA VITRECTOMY Right 04/12/2018   Procedure: Right PARS PLANA VITRECTOMY WITH 25 GAUGE with intravitreal antibiotics;  Surgeon: Valdemar Rogue, MD;  Location: [facility] OR;  Service: Ophthalmology;  Laterality: Right;   PARS PLANA VITRECTOMY Right 10/31/2018   Procedure: PARS PLANA VITRECTOMY WITH 25 GAUGE;  Surgeon: Valdemar Rogue, MD;  Location: [facility] OR;  Service: Ophthalmology;  Laterality: Right;   PHOTOCOAGULATION WITH LASER Right 10/31/2018   Procedure: Photocoagulation With Laser;  Surgeon: Valdemar Rogue, MD;  Location: [facility] OR;  Service: Ophthalmology;  Laterality: Right;   TRANSTHORACIC ECHOCARDIOGRAM  08/23/2020   Grd I DD, o/w normal.   FAMILY HISTORY Family History  Problem Relation Age of Onset   Brain cancer Mother    Diabetes Father    Diabetes Maternal Grandmother    Cataracts Maternal Grandmother    Cervical cancer Paternal Grandmother    Colon cancer  Maternal Grandfather 69   Amblyopia Neg Hx    Blindness Neg Hx    Glaucoma Neg Hx    Macular degeneration Neg Hx    Retinal detachment Neg Hx    Strabismus Neg Hx    Retinitis pigmentosa Neg Hx    Esophageal cancer Neg Hx    Stomach cancer Neg Hx    Rectal cancer Neg Hx    SOCIAL HISTORY Social History   Tobacco Use   Smoking status: Never   Smokeless tobacco: Never  Vaping Use   Vaping status: Never Used  Substance Use Topics   Alcohol use: No   Drug use: No       OPHTHALMIC EXAM:  Base Eye Exam     Visual Acuity (Snellen - Linear)       Right Left   Dist [place] 20/25    Dist cc  20/30 +2   Dist ph cc  NI         Tonometry (Tonopen, 1:18 PM)       Right Left   Pressure 16 15         Pupils       Pupils Dark Light Shape React APD   Right PERRL 3 2 Round Brisk None   Left PERRL 3 2 Round Brisk None         Visual Fields (Counting fingers)       Left Right    Full Full         Extraocular Movement       Right Left    Full, Ortho Full, Ortho         Neuro/Psych     Oriented x3: Yes   Mood/Affect: Normal         Dilation     Both eyes: 1.0% Mydriacyl , 2.5% Phenylephrine  @ 1:19 PM           Slit Lamp and Fundus Exam     Slit Lamp Exam       Right Left   Lids/Lashes Dermatochalasis - upper lid, mild Meibomian gland dysfunction, Telangiectasia Dermatochalasis - upper lid, Meibomian gland dysfunction, Telangiectasia   Conjunctiva/Sclera White and quiet White and quiet   Cornea Clear, well healed temporal cataract wounds Trace Punctate epithelial erosions, trace EBMD   Anterior Chamber Deep and quiet Deep and quiet   Iris Round and dilated, No NVI Round and dilated, No NVI   Lens PC IOL in good position, 1-2+ Posterior capsular opacification, PC folds  2-3+ Nuclear sclerosis with mild brunescence, 2-3+ Cortical cataract, 1+ Posterior subcapsular cataract   Anterior Vitreous post vitrectomy, trace pigment Vitreous syneresis,  vitreous condensations, no frank red heme, old white VH scattered         Fundus Exam       Right Left   Disc mild Pallor, Sharp rim, temporal PPA Pink and sharp, Compact, PPA   C/D Ratio 0.2 0.1   Macula Flat, good foveal reflex, mild cystic changes centrally--slightly improved, focal MA and exudates ST mac -- improved, +focal laser changes Flat, blunted foveal reflex, trace cystic changes- improved, scattered MA; trace ERM, light focal laser changes   Vessels attenuated, Tortuous attenuated, Tortuous   Periphery Attached, rare MA, scattered DBH greatest posteriorly, 360 PRP, good laser fill in 360 attached, scattered IRH; 360 PRP scars -- with good fill in changes           Refraction     Wearing Rx       Sphere Cylinder   Right -0.25 Sphere   Left -4.50 Sphere           IMAGING AND PROCEDURES  Imaging and Procedures for   OCT, Retina - OU - Both Eyes       Right Eye Quality was good. Central Foveal Thickness: 356. Progression has improved. Findings include no SRF, abnormal foveal contour, intraretinal hyper-reflective material, epiretinal membrane, intraretinal fluid (Persistent IRF/cystic changes temporal fovea--slightly improved).   Left Eye Quality was good. Central Foveal Thickness: 267. Progression has improved. Findings include normal foveal contour, no SRF, intraretinal hyper-reflective material (Interval resolution of IRF/cystic changes temporal fovea, stable improvement in vitreous opacities).   Notes *Images captured and stored on drive  Diagnosis / Impression:  +DME OU OD: Persistent IRF/cystic changes temporal fovea--slightly improved OS: Interval resolution of IRF/cystic changes temporal fovea, stable improvement in vitreous opacities  Clinical management:  See below  Abbreviations: NFP - Normal foveal profile. CME - cystoid macular edema. PED - pigment epithelial detachment. IRF - intraretinal fluid. SRF - subretinal fluid. EZ - ellipsoid zone.  ERM - epiretinal membrane. ORA - outer retinal atrophy. ORT - outer retinal tubulation. SRHM - subretinal hyper-reflective material       Intravitreal Injection, Pharmacologic Agent - OD - Right Eye       Time Out 01/21/2024. 1:35 PM. Confirmed correct patient, procedure, site, and patient consented.   Anesthesia Topical anesthesia was used. Anesthetic medications included Lidocaine  2%, Proparacaine  0.5%.   Procedure Preparation included 5% betadine to ocular surface, eyelid speculum. A supplied (32g) needle was used.   Injection: 6 mg faricimab -svoa 6 MG/0.05ML (Patient supplied)   Route: Intravitreal, Site: Right Eye   NDC: 49757-903-93, Lot: [id number], Expiration date: 07/27/2024, Waste: 0 mL   Post-op Post injection exam found visual acuity of at least counting fingers. The patient tolerated the procedure well. There were no complications. The patient received written and verbal post procedure care education.   Notes **PAP MEDICATION ADMINISTERED**           ASSESSMENT/PLAN:   ICD-10-CM   1. Proliferative diabetic retinopathy of both eyes with macular edema associated with type 2 diabetes mellitus (HCC)  E11.3513 OCT, Retina - OU - Both Eyes    Intravitreal Injection, Pharmacologic Agent - OD - Right Eye    faricimab -svoa (VABYSMO ) 6mg /0.23mL intravitreal injection    2. Long term (current) use of oral hypoglycemic drugs  Z79.84     3. Long-term (current) use of injectable non-insulin antidiabetic drugs  Z79.85  4. Vitreous hemorrhage of left eye (HCC)  H43.12     5. Vitreous hemorrhage, right eye (HCC)  H43.11     6. Hypertensive retinopathy of both eyes  H35.033     7. Essential hypertension  I10     8. Right endophthalmia  H44.001     9. Combined forms of age-related cataract of left eye  H25.812     10. Pseudophakia of right eye  Z96.1     11. Current use of insulin (HCC)  Z79.4      1-4.  Proliferative diabetic retinopathy w/ DME, OU - HbA1c 6.9  (10.13.25), 7.1 (09.24.25), 7.9 (10.08.24) - s/p IVA OD #1 9.20.19, #2 (10.25.19), #3 (11.15.19), #4 (12.17.19), #5 (01.14.20), #6 (2.11.20), #7 (05.29.20), #8 (08.19.20), #9 (10.30.20), #10 (12.09.20), #11 (01.11.21), #12 (02.15.21), #13 (03.23.21), #14 (04.20.21), #15 (06.08.21), #16 (07.06.21) -- IVA resistance - s/p IVA OS #1 9.27.19, #2 (10.25.19), #3 (11.15.19), #4 (12.17.19), #5 (01.14.20), #6 (2.11.20), #7 (04.26.20), #8 (05.29.20), #9 (06.26.20), #10 (08.05.20), #11 (11.11.20), #12 (12.09.20), #13 (01.11.21), #14 (02.15.21), #15 (03.23.21), #16 (04.20.21), #17 (06.08.21)  -- IVA resistance, #18 (09.27.21) ===================== - s/p IVE OD #1 (08.03.21) -- sample, #2 (09.10.21), #3 (10.08.21), #4 (11.23.21), #5 (12.21.21), #6 (01.19.22) sample, #7 (2.16.22), #8 (3.22.22), #9 (04.19.22), #10 (05.27.22), #11 (06.29.22), #12 (08.03.22), #13 (09.07.22), #14 (10.10.22), #15 (11.14.22), #16 (12.12.22), #17 (01.10.23) - sample, #18 (02.07.23), #19 (03.21.23), #20 (04.18.23), #21 (05.16.23)  - s/p IVE OS #1 (10.25.21), #2 (11.23.21), #3 (12.21.21), #4 (3.22.22), #5 (06.29.22), #6 (08.03.22), #7 (09.07.22), #8 (10.10.22), #9 (11.14.22), #10 (02.07.23), #11 (05.16.23), #12 (08.15.23), #13 (11.14.23), #14 (02.06.24), #15 (05.06.24), #16 SAMPLE (07.01.24), #17 (09.26.24), #18 (12.20.24) #19 (03.14.25) ===================== - IVV OS #1 (PAP med-06.27.25), #2 (10.20.25)~q3-68months. - IVV OD #1 (06.16.23 sample), #2 (07.17.23), #3 (08.15.23), #4 (09.12.23), #5 (10.10.23), #6 (11.14.23), #7 (12.12.23), #8 (01.09.23), #9 (02.06.24), #10 (03.11.24), #11 (04.08.24), #12 (05.06.24), #13 (06.03.24), #14 (07.01.24), #15 (07.29.24), #16 (08.26.24), #17 (09.05.25), #18 (10.20.25--PAP) - IVV resistance ====================== - s/p IVE HD OD #1 (sample 09.26.24), #2 (10.25.24), #3 (11.21.24), #4 (12.20.24), #5 (01.17.25 -- sample), #6 (02.14.25 sample), #7 (03.14.25 sample)  #9 (04.11.25 sample) #10 (05.23.25  sample),  #11 (06.27.25 SAMPLE) #12 (08.01.25 sample)  - S/P PRP OS (09.20.19), (5.19.20), (08.19.20), (04.28.21), (02.02.22) - S/P PRP OD (9.27.19 and 11.21.19), fill-in (04.14.20) (09.03.20, surgery)  - S/P focal laser OS (07.06.21), OD (09.20.22) - FA (9.20.19) shows +NVE OU and leaking MA and capillary nonperfusion  - repeat FA 11.15.19 shows NV regressing OU - pre-op: OD w/ VA down at 20/25, but there is some preretinal fibrosis / tractional membranes just superior to disc and mild central DME - s/p 25g PPV+MP+10% C3F8 gas OD (09.03.20) -- ERM/PRF removal OD  - BCVA: OD 20/25 (stable) OS 20/30 - stable             - fibrosis/ERM stably improved; retina attached - OCT shows OD: Persistent IRF/cystic changes temporal fovea--slightly improved; OS: Interval resolution of IRF/cystic changes temporal fovea, stable improvement in vitreous opacities at 5 wks - recommend IVV OD #19 (PAP) and hold IVV OS today, with follow up in 6 weeks - [employer] funding unavailable **OS on ~q36m maintenance treatment schedule -- due again ~Jan/Feb 2026**  - RBA of procedure discussed, questions answered - see procedure note - Eylea  informed consent form re-signed and scanned on 05.16.2023  - Vabysmo  informed consent form signed on 11.24.25 (OU) - Eylea  HD informed consent obtained, resigned, and scanned on 10.25.24  -  Eylea  HD approved for 2024 -- but no funding for [employer]  - f/u 6 weeks, DFE, OCT, possible injection(s)  5. Vitreous hemorrhage OS -- stably improved  - recurrent VH, onset 09.22.21 - etiology: secondary to PDR as described above (no RT/RD on exam) - s/p PRP OS (9.20.19), (05.19.20), (08.19.20), (04.28.21), (02.02.22) - s/p IVA OS on 4.26.20, 5.29.20, 6.26.20, 8.5.20,11.11.20, 12.06.20 and so on as above   - Vit condensations stably improved  - BCVA stable at 20/25  - IVEs as above  - f/u 5 weeks DFE, OCT  6. History of Endophthalmitis OD  - s/p IVA OU 04/09/2018 - s/p 25g PPV w/ intravitreal  vanc, ceftaz and cefepime  OD, 2.14.2020  - s/p intravitreal tap / vanc and ceflaz injections (02.16.20)             - gram stain (2.14.20) shows G+ cocci, WBCs mostly PMNs;   - repeat gram stain from t/i (2.16.20) -- no organisms, just WBCs  - cultures from vitreous grew rare Staph warneri; cultures from t/i -- no growth             - doing well, BCVA 20/25             - inflammation/posterior debris resolved  - monitor  7. History of Vitreous Hemorrhage OD -- cleared from PPV x2 for endophthalmitis and ERM/preretinal fibrosis   - secondary to PDR  8,9. Hypertensive retinopathy OU  - discussed importance of tight BP control.  - monitor  10. Combined form age related cataract OS - The symptoms of cataract, surgical options, and treatments and risks were discussed with patient.   - discussed diagnosis and progression  11. Pseudophakia OD  - s/p CE/IOL OD (Dr. CANDIE Gaudy, 12.11.20)  - beautiful surgery, doing well  Ophthalmic Meds Ordered this visit:  Meds ordered this encounter  Medications   faricimab -svoa (VABYSMO ) 6mg /0.46mL intravitreal injection     Return in about 6 weeks (around 03/03/2024) for f/u, PDR, DFE, OCT, Possible, IVV, PAP medication, OD.  There are no Patient Instructions on file for this visit.  This document serves as a record of services personally performed by Redell JUDITHANN Hans, MD, PhD. It was created on their behalf by Avelina Pereyra, COA an ophthalmic technician. The creation of this record is the provider's dictation and/or activities during the visit.   Electronically signed by: Avelina GORMAN Pereyra, COT  01/21/24  1:56 PM   This document serves as a record of services personally performed by Redell JUDITHANN Hans, MD, PhD. It was created on their behalf by Wanda GEANNIE Keens, COT an ophthalmic technician. The creation of this record is the provider's dictation and/or activities during the visit.    Electronically signed by:  Wanda GEANNIE Keens, COT  01/21/24 1:56  PM  Redell JUDITHANN Hans, M.D., Ph.D. Diseases & Surgery of the Retina and Vitreous [facility]  I have reviewed the above documentation for accuracy and completeness, and I agree with the above. Redell JUDITHANN Hans, M.D., Ph.D. 01/21/24 1:57 PM   Abbreviations: M myopia (nearsighted); A astigmatism; H hyperopia (farsighted); P presbyopia; Mrx spectacle prescription;  CTL contact lenses; OD right eye; OS left eye; OU both eyes  XT exotropia; ET esotropia; PEK punctate epithelial keratitis; PEE punctate epithelial erosions; DES dry eye syndrome; MGD meibomian gland dysfunction; ATs artificial tears; PFAT's preservative free artificial tears; NSC nuclear sclerotic cataract; PSC posterior subcapsular cataract; ERM epi-retinal membrane; PVD posterior vitreous detachment; RD retinal detachment;  DM diabetes mellitus; DR diabetic retinopathy; NPDR non-proliferative diabetic retinopathy; PDR proliferative diabetic retinopathy; CSME clinically significant macular edema; DME diabetic macular edema; dbh dot blot hemorrhages; CWS cotton wool spot; POAG primary open angle glaucoma; C/D cup-to-disc ratio; HVF humphrey visual field; GVF goldmann visual field; OCT optical coherence tomography; IOP intraocular pressure; BRVO Branch retinal vein occlusion; CRVO central retinal vein occlusion; CRAO central retinal artery occlusion; BRAO branch retinal artery occlusion; RT retinal tear; SB scleral buckle; PPV pars plana vitrectomy; VH Vitreous hemorrhage; PRP panretinal laser photocoagulation; IVK intravitreal kenalog ; VMT vitreomacular traction; MH Macular hole;  NVD neovascularization of the disc; NVE neovascularization elsewhere; AREDS age related eye disease study; ARMD age related macular degeneration; POAG primary open angle glaucoma; EBMD epithelial/anterior basement membrane dystrophy; ACIOL anterior chamber intraocular lens; IOL intraocular lens; PCIOL posterior chamber intraocular lens; Phaco/IOL  phacoemulsification with intraocular lens placement; PRK photorefractive keratectomy; LASIK laser assisted in situ keratomileusis; HTN hypertension; DM diabetes mellitus; COPD chronic obstructive pulmonary disease

## 2024-01-16 ENCOUNTER — Ambulatory Visit

## 2024-01-16 NOTE — Progress Notes (Incomplete)
 SABRA

## 2024-01-21 ENCOUNTER — Ambulatory Visit (INDEPENDENT_AMBULATORY_CARE_PROVIDER_SITE_OTHER): Admitting: Ophthalmology

## 2024-01-21 ENCOUNTER — Encounter (INDEPENDENT_AMBULATORY_CARE_PROVIDER_SITE_OTHER): Payer: Self-pay | Admitting: Ophthalmology

## 2024-01-21 DIAGNOSIS — Z7985 Long-term (current) use of injectable non-insulin antidiabetic drugs: Secondary | ICD-10-CM | POA: Diagnosis not present

## 2024-01-21 DIAGNOSIS — H4311 Vitreous hemorrhage, right eye: Secondary | ICD-10-CM

## 2024-01-21 DIAGNOSIS — Z7984 Long term (current) use of oral hypoglycemic drugs: Secondary | ICD-10-CM | POA: Diagnosis not present

## 2024-01-21 DIAGNOSIS — Z794 Long term (current) use of insulin: Secondary | ICD-10-CM

## 2024-01-21 DIAGNOSIS — H44001 Unspecified purulent endophthalmitis, right eye: Secondary | ICD-10-CM

## 2024-01-21 DIAGNOSIS — H4312 Vitreous hemorrhage, left eye: Secondary | ICD-10-CM

## 2024-01-21 DIAGNOSIS — Z961 Presence of intraocular lens: Secondary | ICD-10-CM

## 2024-01-21 DIAGNOSIS — H4313 Vitreous hemorrhage, bilateral: Secondary | ICD-10-CM

## 2024-01-21 DIAGNOSIS — I10 Essential (primary) hypertension: Secondary | ICD-10-CM

## 2024-01-21 DIAGNOSIS — H25812 Combined forms of age-related cataract, left eye: Secondary | ICD-10-CM

## 2024-01-21 DIAGNOSIS — E113513 Type 2 diabetes mellitus with proliferative diabetic retinopathy with macular edema, bilateral: Secondary | ICD-10-CM

## 2024-01-21 DIAGNOSIS — H35033 Hypertensive retinopathy, bilateral: Secondary | ICD-10-CM

## 2024-01-21 MED ORDER — FARICIMAB-SVOA 6 MG/0.05ML IZ SOSY
6.0000 mg | PREFILLED_SYRINGE | INTRAVITREAL | Status: AC | PRN
  Administered 2024-01-21: 6 mg via INTRAVITREAL

## 2024-02-19 ENCOUNTER — Encounter: Admitting: Family Medicine

## 2024-02-19 NOTE — Progress Notes (Signed)
 No Show

## 2024-02-19 NOTE — Progress Notes (Shared)
 " [facility] - Clinic Note  03/04/2024     CHIEF COMPLAINT Patient presents for No chief complaint on file.  HISTORY OF PRESENT ILLNESS: Gloria Gomez is a 53 y.o. female who presents to the clinic today for:    Pt states the vision seems about the same.   Referring physician:   HISTORICAL INFORMATION:   Selected notes from the MEDICAL RECORD NUMBER Referred for DM exam   CURRENT MEDICATIONS: No current outpatient medications on file. (Ophthalmic Drugs)   No current facility-administered medications for this visit. (Ophthalmic Drugs)   Current Outpatient Medications (Other)  Medication Sig   Continuous Blood Gluc Sensor (FREESTYLE LIBRE 3 SENSOR) MISC by Does not apply route every 14 (fourteen) days.   FARXIGA 10 MG TABS tablet Take 10 mg by mouth daily.   furosemide  (LASIX ) 20 MG tablet TAKE 2 TABLETS BY MOUTH IN THE MORNING AND 1 IN THE EVENING   gabapentin  (NEURONTIN ) 300 MG capsule TAKE 1 CAPSULE BY MOUTH THREE TIMES DAILY (Patient taking differently: Take 300 mg by mouth 3 (three) times daily. Patient taking once a day)   HUMALOG KWIKPEN 100 UNIT/ML KwikPen Inject into the skin.   HYDROcodone -acetaminophen  (NORCO/VICODIN) 5-325 MG tablet Take 1-2 tablets by mouth every 6 (six) hours as needed for moderate pain (pain score 4-6).   insulin aspart (NOVOLOG) 100 UNIT/ML injection Inject 10 Units into the skin 2 (two) times daily with a meal. Per endocrinologist (Patient not taking: Reported on 01/21/2024)   Insulin Glargine  (BASAGLAR KWIKPEN) 100 UNIT/ML 10 units every morning and 24 units nightly (Patient not taking: Reported on 01/21/2024)   losartan  (COZAAR ) 100 MG tablet Take 1 tablet (100 mg total) by mouth daily.   meloxicam  (MOBIC ) 15 MG tablet Take 1 tablet daily as needed.   metFORMIN  (GLUCOPHAGE ) 1000 MG tablet Take 1 tablet (1,000 mg total) by mouth 2 (two) times daily with a meal.   metoCLOPramide  (REGLAN ) 5 MG tablet TAKE 1 TABLET BY MOUTH 4  TIMES DAILY BEFORE MEAL(S) AND AT BEDTIME (Patient not taking: Reported on 01/21/2024)   MOUNJARO 12.5 MG/0.5ML Pen Inject 12.5 mg into the skin once a week.   Multiple Vitamin (MULTIVITAMIN WITH MINERALS) TABS tablet Take 1 tablet by mouth daily. (Patient not taking: Reported on 01/21/2024)   NOVOLOG FLEXPEN 100 UNIT/ML FlexPen  (Patient not taking: Reported on 01/21/2024)   pantoprazole  (PROTONIX ) 40 MG tablet Take 1 tablet (40 mg total) by mouth daily.   promethazine  (PHENERGAN ) 12.5 MG tablet TAKE 1 TO 2 TABLETS BY MOUTH EVERY 6 HOURS AS NEEDED FOR NAUSEA (Patient not taking: Reported on 01/21/2024)   rosuvastatin  (CRESTOR ) 40 MG tablet Take 1 tablet (40 mg total) by mouth daily. (Patient not taking: Reported on 01/21/2024)   verapamil  (CALAN -SR) 240 MG CR tablet Take by mouth.   No current facility-administered medications for this visit. (Other)   REVIEW OF SYSTEMS:     ALLERGIES Allergies  Allergen Reactions   Gluten Meal Swelling   Lisinopril  Cough   Pioglitazone  Other (See Comments)    ELEVATED glucoses + worse chronic nausea   PAST MEDICAL HISTORY Past Medical History:  Diagnosis Date   Abdominal bloating    likely from diab gastroparesis.  Dr. Leigh started trial of reglan  03/2019.   Benign brain tumor (HCC)    Cystic lesion in cerebral aqueduct region with mild hydrocephalus-- stable MRI 02/2016.  Surveillance MRI 05/2017 --dilated cerebral aqueduct related to aqueductal stenosis and subsequent mild hydrocephalus (due to  the 11 mm stable cystic lesion in cerebral aqueduct---?congenitial?.   Bilateral lower extremity edema    Cataract    OU   Dysmenorrhea    vicodin occ during first 2 days of cycle.   Fibromyalgia    Fibula fracture 07/2023   distal, nondisplaced (Right)   Gluten intolerance    pt reports she underwent full GI w/u to r/o celiac dz   Hepatic steatosis    ultrasound 08/2017. Hx of very mild elevation of ALT.  Stable on u/s 06/2020   History of  adenomatous polyp of colon 04/08/2019   recall Feb 2024   Hyperlipidemia, mixed    Hypertension    +white coat component   Hypertensive retinopathy of both eyes    Insomnia    Iron deficiency 01/2019   Hb 11.3. Hemoccults neg x 3 03/19/19. EGD and colonoscopy 04/08/19 showed NO cause for IDA.  Pt does have menorrhagia, though, so she'll see her GYN.  started FeSO4 325 qd approx 04/14/19.   Menorrhagia    resulting in IDA 2021   Orthostatic hypotension    PMR (polymyalgia rheumatica) 2022   question of; hx of elevated ESR-->better with prednisone  but prednisone  was contraindicated d/t eye issues/hyperglycemia-->rheum started following her 02/2021->plaquenil 04/2021   Proliferative diabetic retinopathy of both eyes (HCC)    steroid injections 10/2017--improved   Sensorineural hearing loss of left ear    Sudden left hearing loss summer 2016--no improvement with steroids 01/2015 so brain MRI done by Dr. Carlie and it showed brain tumor that was determined to be benign.  Pt's hearing not bad enough for hearing aid as of 06/2016.   Type 2 diabetes with complication (HCC)    +microalbuminuria, diab retpthy, diabetic gastroparesis (gastric emptying study mildly abnl 03/2017).  Recommended lantus  08/2018 but pt declined. Mild microalbuminuria.   Uterine fibroid 2022   per pt report, pelvic u/s in GYN office   Past Surgical History:  Procedure Laterality Date   ANOSCOPY  05/12/2019   Procedure: normal exam, minimal hemorrhoid disease. Hyertrophied anal papila, benign appearing, posterior midline. Surgeon: Bernarda Ned MD   CHOLECYSTECTOMY  2000   COLONOSCOPY  04/08/2019   5 adenomas, recall 3 yrs; no cause for IDA found.  Hypertrophied anal papillae->bx showed low grade dysplasia; GI referred her to colorectal surgeon.   ESOPHAGOGASTRODUODENOSCOPY  04/08/2019   mild chronic reactive gastritis. H pylori NEG.  No cause for IDA found.   GAS INSERTION Right 10/31/2018   Procedure: Insertion Of C3F8 Gas;   Surgeon: Valdemar Rogue, MD;  Location: [facility] OR;  Service: Ophthalmology;  Laterality: Right;   GASTRIC EMPTYING SCAN  04/20/2017   Mildly abnormal, particularly the 1st hour of emptying.   MEMBRANE PEEL Right 10/31/2018   Procedure: MEMBRANE PEEL;  Surgeon: Valdemar Rogue, MD;  Location: [facility] OR;  Service: Ophthalmology;  Laterality: Right;   PARS PLANA VITRECTOMY Right 04/12/2018   Procedure: Right PARS PLANA VITRECTOMY WITH 25 GAUGE with intravitreal antibiotics;  Surgeon: Valdemar Rogue, MD;  Location: [facility] OR;  Service: Ophthalmology;  Laterality: Right;   PARS PLANA VITRECTOMY Right 10/31/2018   Procedure: PARS PLANA VITRECTOMY WITH 25 GAUGE;  Surgeon: Valdemar Rogue, MD;  Location: [facility] OR;  Service: Ophthalmology;  Laterality: Right;   PHOTOCOAGULATION WITH LASER Right 10/31/2018   Procedure: Photocoagulation With Laser;  Surgeon: Valdemar Rogue, MD;  Location: [facility] OR;  Service: Ophthalmology;  Laterality: Right;   TRANSTHORACIC ECHOCARDIOGRAM  08/23/2020   Grd I DD, o/w normal.   FAMILY HISTORY Family  History  Problem Relation Age of Onset   Brain cancer Mother    Diabetes Father    Diabetes Maternal Grandmother    Cataracts Maternal Grandmother    Cervical cancer Paternal Grandmother    Colon cancer Maternal Grandfather 64   Amblyopia Neg Hx    Blindness Neg Hx    Glaucoma Neg Hx    Macular degeneration Neg Hx    Retinal detachment Neg Hx    Strabismus Neg Hx    Retinitis pigmentosa Neg Hx    Esophageal cancer Neg Hx    Stomach cancer Neg Hx    Rectal cancer Neg Hx    SOCIAL HISTORY Social History   Tobacco Use   Smoking status: Never   Smokeless tobacco: Never  Vaping Use   Vaping status: Never Used  Substance Use Topics   Alcohol use: No   Drug use: No       OPHTHALMIC EXAM:  Not recorded    IMAGING AND PROCEDURES  Imaging and Procedures for           ASSESSMENT/PLAN: No diagnosis found.  1-4.  Proliferative diabetic retinopathy w/ DME, OU - HbA1c 6.9  (10.13.25), 7.1 (09.24.25), 7.9 (10.08.24) - s/p IVA OD #1 9.20.19, #2 (10.25.19), #3 (11.15.19), #4 (12.17.19), #5 (01.14.20), #6 (2.11.20), #7 (05.29.20), #8 (08.19.20), #9 (10.30.20), #10 (12.09.20), #11 (01.11.21), #12 (02.15.21), #13 (03.23.21), #14 (04.20.21), #15 (06.08.21), #16 (07.06.21) -- IVA resistance - s/p IVA OS #1 9.27.19, #2 (10.25.19), #3 (11.15.19), #4 (12.17.19), #5 (01.14.20), #6 (2.11.20), #7 (04.26.20), #8 (05.29.20), #9 (06.26.20), #10 (08.05.20), #11 (11.11.20), #12 (12.09.20), #13 (01.11.21), #14 (02.15.21), #15 (03.23.21), #16 (04.20.21), #17 (06.08.21)  -- IVA resistance, #18 (09.27.21) ===================== - s/p IVE OD #1 (08.03.21) -- sample, #2 (09.10.21), #3 (10.08.21), #4 (11.23.21), #5 (12.21.21), #6 (01.19.22) sample, #7 (2.16.22), #8 (3.22.22), #9 (04.19.22), #10 (05.27.22), #11 (06.29.22), #12 (08.03.22), #13 (09.07.22), #14 (10.10.22), #15 (11.14.22), #16 (12.12.22), #17 (01.10.23) - sample, #18 (02.07.23), #19 (03.21.23), #20 (04.18.23), #21 (05.16.23)  - s/p IVE OS #1 (10.25.21), #2 (11.23.21), #3 (12.21.21), #4 (3.22.22), #5 (06.29.22), #6 (08.03.22), #7 (09.07.22), #8 (10.10.22), #9 (11.14.22), #10 (02.07.23), #11 (05.16.23), #12 (08.15.23), #13 (11.14.23), #14 (02.06.24), #15 (05.06.24), #16 SAMPLE (07.01.24), #17 (09.26.24), #18 (12.20.24) #19 (03.14.25) ===================== - IVV OS #1 (PAP med-06.27.25), #2 (10.20.25)~q3-50months. - IVV OD #1 (06.16.23 sample), #2 (07.17.23), #3 (08.15.23), #4 (09.12.23), #5 (10.10.23), #6 (11.14.23), #7 (12.12.23), #8 (01.09.23), #9 (02.06.24), #10 (03.11.24), #11 (04.08.24), #12 (05.06.24), #13 (06.03.24), #14 (07.01.24), #15 (07.29.24), #16 (08.26.24), #17 (09.05.25), #18 (10.20.25--PAP), #19 (11.24.25 PAP) - IVV resistance ====================== - s/p IVE HD OD #1 (sample 09.26.24), #2 (10.25.24), #3 (11.21.24), #4 (12.20.24), #5 (01.17.25 -- sample), #6 (02.14.25 sample), #7 (03.14.25 sample)  #9 (04.11.25 sample) #10  (05.23.25  sample), #11 (06.27.25 SAMPLE) #12 (08.01.25 sample)  - S/P PRP OS (09.20.19), (5.19.20), (08.19.20), (04.28.21), (02.02.22) - S/P PRP OD (9.27.19 and 11.21.19), fill-in (04.14.20) (09.03.20, surgery)  - S/P focal laser OS (07.06.21), OD (09.20.22) - FA (9.20.19) shows +NVE OU and leaking MA and capillary nonperfusion  - repeat FA 11.15.19 shows NV regressing OU - pre-op: OD w/ VA down at 20/25, but there is some preretinal fibrosis / tractional membranes just superior to disc and mild central DME - s/p 25g PPV+MP+10% C3F8 gas OD (09.03.20) -- ERM/PRF removal OD  - BCVA: OD 20/25 (stable) OS 20/30 - stable             - fibrosis/ERM stably improved; retina attached - OCT shows  OD: Persistent IRF/cystic changes temporal fovea--slightly improved; OS: Interval resolution of IRF/cystic changes temporal fovea, stable improvement in vitreous opacities at 5 wks - recommend IVV OD #20 (PAP) and hold IVV OS today 01.06.26, with follow up in 6 weeks - [employer] funding unavailable **OS on ~q89m maintenance treatment schedule -- due again ~Jan/Feb 2026**  - RBA of procedure discussed, questions answered - see procedure note - [facility]  informed consent form re-signed and scanned on 05.16.2023  - [facility]  informed consent form signed on 11.24.25 (OU) - [facility]  HD informed consent obtained, resigned, and scanned on 10.25.24  - [facility]  HD approved for 2024 -- but no funding for [employer]  - f/u 6 weeks, DFE, OCT, possible injection(s)  5. Vitreous hemorrhage OS -- stably improved  - recurrent VH, onset 09.22.21 - etiology: secondary to PDR as described above (no RT/RD on exam) - s/p PRP OS (9.20.19), (05.19.20), (08.19.20), (04.28.21), (02.02.22) - s/p IVA OS on 4.26.20, 5.29.20, 6.26.20, 8.5.20,11.11.20, 12.06.20 and so on as above   - Vit condensations stably improved  - BCVA stable at 20/25  - IVEs as above  - f/u 5 weeks DFE, OCT  6. History of Endophthalmitis OD  - s/p IVA OU 04/09/2018 - s/p  25g PPV w/ intravitreal vanc, ceftaz and cefepime  OD, 2.14.2020  - s/p intravitreal tap / vanc and ceflaz injections (02.16.20)             - gram stain (2.14.20) shows G+ cocci, WBCs mostly PMNs;   - repeat gram stain from t/i (2.16.20) -- no organisms, just WBCs  - cultures from vitreous grew rare Staph warneri; cultures from t/i -- no growth             - doing well, BCVA 20/25             - inflammation/posterior debris resolved  - monitor  7. History of Vitreous Hemorrhage OD -- cleared from PPV x2 for endophthalmitis and ERM/preretinal fibrosis   - secondary to PDR  8,9. Hypertensive retinopathy OU  - discussed importance of tight BP control.  - monitor  10. Combined form age related cataract OS - The symptoms of cataract, surgical options, and treatments and risks were discussed with patient.   - discussed diagnosis and progression  11. Pseudophakia OD  - s/p CE/IOL OD (Dr. CANDIE Gaudy, 12.11.20)  - beautiful surgery, doing well  Ophthalmic Meds Ordered this visit:  No orders of the defined types were placed in this encounter.    No follow-ups on file.  There are no Patient Instructions on file for this visit.   This document serves as a record of services personally performed by Redell JUDITHANN Hans, MD, PhD. It was created on their behalf by Wanda GEANNIE Keens, COT an ophthalmic technician. The creation of this record is the provider's dictation and/or activities during the visit.    Electronically signed by:  Wanda GEANNIE Keens, COT  02/19/2024 8:16 AM  Redell JUDITHANN Hans, M.D., Ph.D. Diseases & Surgery of the Retina and Vitreous [facility]   Abbreviations: M myopia (nearsighted); A astigmatism; H hyperopia (farsighted); P presbyopia; Mrx spectacle prescription;  CTL contact lenses; OD right eye; OS left eye; OU both eyes  XT exotropia; ET esotropia; PEK punctate epithelial keratitis; PEE punctate epithelial erosions; DES dry eye syndrome; MGD  meibomian gland dysfunction; ATs artificial tears; PFAT's preservative free artificial tears; NSC nuclear sclerotic cataract; PSC posterior subcapsular cataract; ERM epi-retinal membrane; PVD posterior vitreous detachment;  RD retinal detachment; DM diabetes mellitus; DR diabetic retinopathy; NPDR non-proliferative diabetic retinopathy; PDR proliferative diabetic retinopathy; CSME clinically significant macular edema; DME diabetic macular edema; dbh dot blot hemorrhages; CWS cotton wool spot; POAG primary open angle glaucoma; C/D cup-to-disc ratio; HVF humphrey visual field; GVF goldmann visual field; OCT optical coherence tomography; IOP intraocular pressure; BRVO Branch retinal vein occlusion; CRVO central retinal vein occlusion; CRAO central retinal artery occlusion; BRAO branch retinal artery occlusion; RT retinal tear; SB scleral buckle; PPV pars plana vitrectomy; VH Vitreous hemorrhage; PRP panretinal laser photocoagulation; IVK intravitreal kenalog ; VMT vitreomacular traction; MH Macular hole;  NVD neovascularization of the disc; NVE neovascularization elsewhere; AREDS age related eye disease study; ARMD age related macular degeneration; POAG primary open angle glaucoma; EBMD epithelial/anterior basement membrane dystrophy; ACIOL anterior chamber intraocular lens; IOL intraocular lens; PCIOL posterior chamber intraocular lens; Phaco/IOL phacoemulsification with intraocular lens placement; PRK photorefractive keratectomy; LASIK laser assisted in situ keratomileusis; HTN hypertension; DM diabetes mellitus; COPD chronic obstructive pulmonary disease "

## 2024-02-25 ENCOUNTER — Encounter: Payer: Self-pay | Admitting: Family Medicine

## 2024-03-04 ENCOUNTER — Encounter (INDEPENDENT_AMBULATORY_CARE_PROVIDER_SITE_OTHER): Admitting: Ophthalmology

## 2024-03-04 DIAGNOSIS — H35033 Hypertensive retinopathy, bilateral: Secondary | ICD-10-CM

## 2024-03-04 DIAGNOSIS — H4312 Vitreous hemorrhage, left eye: Secondary | ICD-10-CM

## 2024-03-04 DIAGNOSIS — I10 Essential (primary) hypertension: Secondary | ICD-10-CM

## 2024-03-04 DIAGNOSIS — H44001 Unspecified purulent endophthalmitis, right eye: Secondary | ICD-10-CM

## 2024-03-04 DIAGNOSIS — H4311 Vitreous hemorrhage, right eye: Secondary | ICD-10-CM

## 2024-03-04 DIAGNOSIS — Z7985 Long-term (current) use of injectable non-insulin antidiabetic drugs: Secondary | ICD-10-CM

## 2024-03-04 DIAGNOSIS — Z961 Presence of intraocular lens: Secondary | ICD-10-CM

## 2024-03-04 DIAGNOSIS — H25812 Combined forms of age-related cataract, left eye: Secondary | ICD-10-CM

## 2024-03-04 DIAGNOSIS — Z7984 Long term (current) use of oral hypoglycemic drugs: Secondary | ICD-10-CM

## 2024-03-04 DIAGNOSIS — E113513 Type 2 diabetes mellitus with proliferative diabetic retinopathy with macular edema, bilateral: Secondary | ICD-10-CM

## 2024-03-05 ENCOUNTER — Encounter (INDEPENDENT_AMBULATORY_CARE_PROVIDER_SITE_OTHER): Payer: Self-pay | Admitting: Ophthalmology

## 2024-03-05 ENCOUNTER — Ambulatory Visit (INDEPENDENT_AMBULATORY_CARE_PROVIDER_SITE_OTHER): Admitting: Ophthalmology

## 2024-03-05 DIAGNOSIS — Z961 Presence of intraocular lens: Secondary | ICD-10-CM

## 2024-03-05 DIAGNOSIS — Z7985 Long-term (current) use of injectable non-insulin antidiabetic drugs: Secondary | ICD-10-CM | POA: Diagnosis not present

## 2024-03-05 DIAGNOSIS — I10 Essential (primary) hypertension: Secondary | ICD-10-CM | POA: Diagnosis not present

## 2024-03-05 DIAGNOSIS — E113513 Type 2 diabetes mellitus with proliferative diabetic retinopathy with macular edema, bilateral: Secondary | ICD-10-CM

## 2024-03-05 DIAGNOSIS — H4312 Vitreous hemorrhage, left eye: Secondary | ICD-10-CM

## 2024-03-05 DIAGNOSIS — Z7984 Long term (current) use of oral hypoglycemic drugs: Secondary | ICD-10-CM

## 2024-03-05 DIAGNOSIS — H35033 Hypertensive retinopathy, bilateral: Secondary | ICD-10-CM | POA: Diagnosis not present

## 2024-03-05 DIAGNOSIS — H4311 Vitreous hemorrhage, right eye: Secondary | ICD-10-CM

## 2024-03-05 DIAGNOSIS — H4313 Vitreous hemorrhage, bilateral: Secondary | ICD-10-CM

## 2024-03-05 DIAGNOSIS — H44001 Unspecified purulent endophthalmitis, right eye: Secondary | ICD-10-CM

## 2024-03-05 DIAGNOSIS — H25812 Combined forms of age-related cataract, left eye: Secondary | ICD-10-CM

## 2024-03-05 LAB — HM DIABETES EYE EXAM

## 2024-03-05 NOTE — Progress Notes (Signed)
 " [facility] - Clinic Note  03/05/2024     CHIEF COMPLAINT Patient presents for Retina Follow Up  HISTORY OF PRESENT ILLNESS: Gloria Gomez is a 54 y.o. female who presents to the clinic today for:  HPI     Retina Follow Up   Patient presents with  Diabetic Retinopathy.  In both eyes.  This started 6 weeks ago.  Duration of 6 weeks.  Since onset it is stable.  I, the attending physician,  performed the HPI with the patient and updated documentation appropriately.        Comments   6 week retina follow up PDR OU and IVV pap pt is reporting  vision not as sharp  noticed she denies any flashes her last reading 132 last A1C 6.7      Last edited by Valdemar Rogue, MD on 03/06/2024  4:41 PM.      Referring physician:   HISTORICAL INFORMATION:   Selected notes from the MEDICAL RECORD NUMBER Referred for DM exam   CURRENT MEDICATIONS: No current outpatient medications on file. (Ophthalmic Drugs)   No current facility-administered medications for this visit. (Ophthalmic Drugs)   Current Outpatient Medications (Other)  Medication Sig   Continuous Blood Gluc Sensor (FREESTYLE LIBRE 3 SENSOR) MISC by Does not apply route every 14 (fourteen) days.   FARXIGA 10 MG TABS tablet Take 10 mg by mouth daily.   furosemide  (LASIX ) 20 MG tablet TAKE 2 TABLETS BY MOUTH IN THE MORNING AND 1 IN THE EVENING   gabapentin  (NEURONTIN ) 300 MG capsule TAKE 1 CAPSULE BY MOUTH THREE TIMES DAILY (Patient taking differently: Take 300 mg by mouth 3 (three) times daily. Patient taking once a day)   HUMALOG KWIKPEN 100 UNIT/ML KwikPen Inject into the skin.   HYDROcodone -acetaminophen  (NORCO/VICODIN) 5-325 MG tablet Take 1-2 tablets by mouth every 6 (six) hours as needed for moderate pain (pain score 4-6).   insulin aspart (NOVOLOG) 100 UNIT/ML injection Inject 10 Units into the skin 2 (two) times daily with a meal. Per endocrinologist   Insulin Glargine  (BASAGLAR KWIKPEN) 100 UNIT/ML 10  units every morning and 24 units nightly   losartan  (COZAAR ) 100 MG tablet Take 1 tablet (100 mg total) by mouth daily.   meloxicam  (MOBIC ) 15 MG tablet Take 1 tablet daily as needed.   metFORMIN  (GLUCOPHAGE ) 1000 MG tablet Take 1 tablet (1,000 mg total) by mouth 2 (two) times daily with a meal.   metoCLOPramide  (REGLAN ) 5 MG tablet TAKE 1 TABLET BY MOUTH 4 TIMES DAILY BEFORE MEAL(S) AND AT BEDTIME   MOUNJARO 12.5 MG/0.5ML Pen Inject 12.5 mg into the skin once a week.   Multiple Vitamin (MULTIVITAMIN WITH MINERALS) TABS tablet Take 1 tablet by mouth daily.   NOVOLOG FLEXPEN 100 UNIT/ML FlexPen    pantoprazole  (PROTONIX ) 40 MG tablet Take 1 tablet (40 mg total) by mouth daily.   promethazine  (PHENERGAN ) 12.5 MG tablet TAKE 1 TO 2 TABLETS BY MOUTH EVERY 6 HOURS AS NEEDED FOR NAUSEA   rosuvastatin  (CRESTOR ) 40 MG tablet Take 1 tablet (40 mg total) by mouth daily.   verapamil  (CALAN -SR) 240 MG CR tablet Take by mouth.   No current facility-administered medications for this visit. (Other)   REVIEW OF SYSTEMS: ROS   Positive for: Endocrine, Eyes Negative for: Constitutional, Gastrointestinal, Neurological, Skin, Genitourinary, Musculoskeletal, HENT, Cardiovascular, Respiratory, Psychiatric, Allergic/Imm, Heme/Lymph Last edited by Resa Delon ORN, COT on 03/05/2024  1:26 PM.     ALLERGIES Allergies  Allergen Reactions  Gluten Meal Swelling   Lisinopril  Cough   Pioglitazone  Other (See Comments)    ELEVATED glucoses + worse chronic nausea   PAST MEDICAL HISTORY Past Medical History:  Diagnosis Date   Abdominal bloating    likely from diab gastroparesis.  Dr. Leigh started trial of reglan  03/2019.   Benign brain tumor (HCC)    Cystic lesion in cerebral aqueduct region with mild hydrocephalus-- stable MRI 02/2016.  Surveillance MRI 05/2017 --dilated cerebral aqueduct related to aqueductal stenosis and subsequent mild hydrocephalus (due to the 11 mm stable cystic lesion in cerebral  aqueduct---?congenitial?.   Bilateral lower extremity edema    Cataract    OU   Dysmenorrhea    vicodin occ during first 2 days of cycle.   Fibromyalgia    Fibula fracture 07/2023   distal, nondisplaced (Right)   Gluten intolerance    pt reports she underwent full GI w/u to r/o celiac dz   Hepatic steatosis    ultrasound 08/2017. Hx of very mild elevation of ALT.  Stable on u/s 06/2020   History of adenomatous polyp of colon 04/08/2019   recall Feb 2024   Hyperlipidemia, mixed    Hypertension    +white coat component   Hypertensive retinopathy of both eyes    Insomnia    Iron deficiency 01/2019   Hb 11.3. Hemoccults neg x 3 03/19/19. EGD and colonoscopy 04/08/19 showed NO cause for IDA.  Pt does have menorrhagia, though, so she'll see her GYN.  started FeSO4 325 qd approx 04/14/19.   Menorrhagia    resulting in IDA 2021   Orthostatic hypotension    PMR (polymyalgia rheumatica) 2022   question of; hx of elevated ESR-->better with prednisone  but prednisone  was contraindicated d/t eye issues/hyperglycemia-->rheum started following her 02/2021->plaquenil 04/2021   Proliferative diabetic retinopathy of both eyes (HCC)    steroid injections 10/2017--improved   Sensorineural hearing loss of left ear    Sudden left hearing loss summer 2016--no improvement with steroids 01/2015 so brain MRI done by Dr. Carlie and it showed brain tumor that was determined to be benign.  Pt's hearing not bad enough for hearing aid as of 06/2016.   Type 2 diabetes with complication (HCC)    +microalbuminuria, diab retpthy, diabetic gastroparesis (gastric emptying study mildly abnl 03/2017).  Recommended lantus  08/2018 but pt declined. Mild microalbuminuria.   Uterine fibroid 2022   per pt report, pelvic u/s in GYN office   Past Surgical History:  Procedure Laterality Date   ANOSCOPY  05/12/2019   Procedure: normal exam, minimal hemorrhoid disease. Hyertrophied anal papila, benign appearing, posterior midline. Surgeon:  Bernarda Ned MD   CHOLECYSTECTOMY  2000   COLONOSCOPY  04/08/2019   5 adenomas, recall 3 yrs; no cause for IDA found.  Hypertrophied anal papillae->bx showed low grade dysplasia; GI referred her to colorectal surgeon.   ESOPHAGOGASTRODUODENOSCOPY  04/08/2019   mild chronic reactive gastritis. H pylori NEG.  No cause for IDA found.   GAS INSERTION Right 10/31/2018   Procedure: Insertion Of C3F8 Gas;  Surgeon: Valdemar Rogue, MD;  Location: [facility] OR;  Service: Ophthalmology;  Laterality: Right;   GASTRIC EMPTYING SCAN  04/20/2017   Mildly abnormal, particularly the 1st hour of emptying.   MEMBRANE PEEL Right 10/31/2018   Procedure: MEMBRANE PEEL;  Surgeon: Valdemar Rogue, MD;  Location: [facility] OR;  Service: Ophthalmology;  Laterality: Right;   PARS PLANA VITRECTOMY Right 04/12/2018   Procedure: Right PARS PLANA VITRECTOMY WITH 25 GAUGE with intravitreal antibiotics;  Surgeon:  Valdemar Rogue, MD;  Location: [facility] OR;  Service: Ophthalmology;  Laterality: Right;   PARS PLANA VITRECTOMY Right 10/31/2018   Procedure: PARS PLANA VITRECTOMY WITH 25 GAUGE;  Surgeon: Valdemar Rogue, MD;  Location: [facility] OR;  Service: Ophthalmology;  Laterality: Right;   PHOTOCOAGULATION WITH LASER Right 10/31/2018   Procedure: Photocoagulation With Laser;  Surgeon: Valdemar Rogue, MD;  Location: [facility] OR;  Service: Ophthalmology;  Laterality: Right;   TRANSTHORACIC ECHOCARDIOGRAM  08/23/2020   Grd I DD, o/w normal.   FAMILY HISTORY Family History  Problem Relation Age of Onset   Brain cancer Mother    Diabetes Father    Diabetes Maternal Grandmother    Cataracts Maternal Grandmother    Cervical cancer Paternal Grandmother    Colon cancer Maternal Grandfather 50   Amblyopia Neg Hx    Blindness Neg Hx    Glaucoma Neg Hx    Macular degeneration Neg Hx    Retinal detachment Neg Hx    Strabismus Neg Hx    Retinitis pigmentosa Neg Hx    Esophageal cancer Neg Hx    Stomach cancer Neg Hx    Rectal cancer Neg Hx    SOCIAL  HISTORY Social History   Tobacco Use   Smoking status: Never   Smokeless tobacco: Never  Vaping Use   Vaping status: Never Used  Substance Use Topics   Alcohol use: No   Drug use: No       OPHTHALMIC EXAM:  Base Eye Exam     Visual Acuity (Snellen - Linear)       Right Left   Dist [place] 20/30 -2    Dist cc  20/30 -2   Dist ph Dante NI    Dist ph cc  NI    Correction: Glasses         Tonometry (Tonopen, 1:37 PM)       Right Left   Pressure 17 18         Pupils       Pupils Dark Light Shape React APD   Right PERRL 3 2 Round Brisk None   Left PERRL 3 2 Round Brisk None         Visual Fields       Left Right    Full Full         Extraocular Movement       Right Left    Full, Ortho Full, Ortho         Neuro/Psych     Oriented x3: Yes   Mood/Affect: Normal         Dilation     Both eyes: 2.5% Phenylephrine  @ 1:37 PM           Slit Lamp and Fundus Exam     Slit Lamp Exam       Right Left   Lids/Lashes Dermatochalasis - upper lid, mild Meibomian gland dysfunction, Telangiectasia Dermatochalasis - upper lid, Meibomian gland dysfunction, Telangiectasia   Conjunctiva/Sclera White and quiet White and quiet   Cornea Clear, well healed temporal cataract wounds Trace Punctate epithelial erosions, trace EBMD   Anterior Chamber Deep and quiet Deep and quiet   Iris Round and dilated, No NVI Round and dilated, No NVI   Lens PC IOL in good position, 1-2+ Posterior capsular opacification, PC folds 2-3+ Nuclear sclerosis with mild brunescence, 2-3+ Cortical cataract, 1+ Posterior subcapsular cataract   Anterior Vitreous post vitrectomy, trace pigment Vitreous syneresis, vitreous condensations, no frank red heme, old white  VH scattered         Fundus Exam       Right Left   Disc mild Pallor, Sharp rim, temporal PPA Pink and sharp, Compact, PPA   C/D Ratio 0.2 0.1   Macula Flat, good foveal reflex, mild cystic changes centrally--slightly  improved, focal MA and exudates ST mac -- improved, +focal laser changes Flat, blunted foveal reflex, trace cystic changes- improved, scattered MA; trace ERM, light focal laser changes   Vessels attenuated, Tortuous attenuated, Tortuous   Periphery Attached, rare MA, scattered DBH greatest posteriorly, 360 PRP, good laser fill in 360 attached, scattered IRH; 360 PRP scars -- with good fill in changes           Refraction     Wearing Rx       Sphere Cylinder   Right -0.25 Sphere   Left -4.50 Sphere           IMAGING AND PROCEDURES  Imaging and Procedures for   OCT, Retina - OU - Both Eyes       Right Eye Quality was good. Central Foveal Thickness: 340. Progression has improved. Findings include no SRF, abnormal foveal contour, intraretinal hyper-reflective material, epiretinal membrane, intraretinal fluid (Persistent IRF/cystic changes temporal fovea--slightly improved).   Left Eye Quality was good. Central Foveal Thickness: 276. Progression has been stable. Findings include normal foveal contour, no SRF, intraretinal hyper-reflective material (Stable resolution of IRF/cystic changes temporal fovea, stable improvement in vitreous opacities).   Notes *Images captured and stored on drive  Diagnosis / Impression:  +DME OU OD: Persistent IRF/cystic changes temporal fovea--slightly improved OS: Stable resolution of IRF/cystic changes temporal fovea, stable improvement in vitreous opacities  Clinical management:  See below  Abbreviations: NFP - Normal foveal profile. CME - cystoid macular edema. PED - pigment epithelial detachment. IRF - intraretinal fluid. SRF - subretinal fluid. EZ - ellipsoid zone. ERM - epiretinal membrane. ORA - outer retinal atrophy. ORT - outer retinal tubulation. SRHM - subretinal hyper-reflective material       Intravitreal Injection, Pharmacologic Agent - OD - Right Eye       Time Out 03/05/2024. 2:36 PM. Confirmed correct patient, procedure,  site, and patient consented.   Anesthesia Topical anesthesia was used. Anesthetic medications included Lidocaine  2%, Proparacaine  0.5%.   Procedure Preparation included 5% betadine to ocular surface, eyelid speculum. A supplied (32g) needle was used.   Injection: 6 mg faricimab -svoa 6 MG/0.05ML (Patient supplied)   Route: Intravitreal, Site: Right Eye   NDC: 49757-903-93, Lot: [id number], Expiration date: 11/26/2024, Waste: 0 mL   Post-op Post injection exam found visual acuity of at least counting fingers. The patient tolerated the procedure well. There were no complications. The patient received written and verbal post procedure care education.   Notes **PAP MEDICATION ADMINISTERED**           ASSESSMENT/PLAN:   ICD-10-CM   1. Proliferative diabetic retinopathy of both eyes with macular edema associated with type 2 diabetes mellitus (HCC)  E11.3513 OCT, Retina - OU - Both Eyes    Intravitreal Injection, Pharmacologic Agent - OD - Right Eye    faricimab -svoa (VABYSMO ) 6mg /0.42mL intravitreal injection    2. Long term (current) use of oral hypoglycemic drugs  Z79.84     3. Long-term (current) use of injectable non-insulin antidiabetic drugs  Z79.85     4. Vitreous hemorrhage of left eye (HCC)  H43.12     5. Vitreous hemorrhage, right eye (HCC)  H43.11     6.  Hypertensive retinopathy of both eyes  H35.033     7. Essential hypertension  I10     8. Right endophthalmia  H44.001     9. Combined forms of age-related cataract of left eye  H25.812     10. Pseudophakia of right eye  Z96.1      1-4.  Proliferative diabetic retinopathy w/ DME, OU - HbA1c 6.9 (10.13.25), 7.1 (09.24.25), 7.9 (10.08.24) - s/p IVA OD #1 9.20.19, #2 (10.25.19), #3 (11.15.19), #4 (12.17.19), #5 (01.14.20), #6 (2.11.20), #7 (05.29.20), #8 (08.19.20), #9 (10.30.20), #10 (12.09.20), #11 (01.11.21), #12 (02.15.21), #13 (03.23.21), #14 (04.20.21), #15 (06.08.21), #16 (07.06.21) -- IVA resistance - s/p IVA  OS #1 9.27.19, #2 (10.25.19), #3 (11.15.19), #4 (12.17.19), #5 (01.14.20), #6 (2.11.20), #7 (04.26.20), #8 (05.29.20), #9 (06.26.20), #10 (08.05.20), #11 (11.11.20), #12 (12.09.20), #13 (01.11.21), #14 (02.15.21), #15 (03.23.21), #16 (04.20.21), #17 (06.08.21)  -- IVA resistance, #18 (09.27.21) ===================== - s/p IVE OD #1 (08.03.21) -- sample, #2 (09.10.21), #3 (10.08.21), #4 (11.23.21), #5 (12.21.21), #6 (01.19.22) sample, #7 (2.16.22), #8 (3.22.22), #9 (04.19.22), #10 (05.27.22), #11 (06.29.22), #12 (08.03.22), #13 (09.07.22), #14 (10.10.22), #15 (11.14.22), #16 (12.12.22), #17 (01.10.23) - sample, #18 (02.07.23), #19 (03.21.23), #20 (04.18.23), #21 (05.16.23)  - s/p IVE OS #1 (10.25.21), #2 (11.23.21), #3 (12.21.21), #4 (3.22.22), #5 (06.29.22), #6 (08.03.22), #7 (09.07.22), #8 (10.10.22), #9 (11.14.22), #10 (02.07.23), #11 (05.16.23), #12 (08.15.23), #13 (11.14.23), #14 (02.06.24), #15 (05.06.24), #16 SAMPLE (07.01.24), #17 (09.26.24), #18 (12.20.24) #19 (03.14.25) ===================== - IVV OS #1 (PAP med-06.27.25), #2 (10.20.25)~q3-83months. - IVV OD #1 (06.16.23 sample), #2 (07.17.23), #3 (08.15.23), #4 (09.12.23), #5 (10.10.23), #6 (11.14.23), #7 (12.12.23), #8 (01.09.23), #9 (02.06.24), #10 (03.11.24), #11 (04.08.24), #12 (05.06.24), #13 (06.03.24), #14 (07.01.24), #15 (07.29.24), #16 (08.26.24), #17 (09.05.25), #18 (10.20.25--PAP), #19 (11.24.25 PAP) ====================== - s/p IVE HD OD #1 (sample 09.26.24), #2 (10.25.24), #3 (11.21.24), #4 (12.20.24), #5 (01.17.25 -- sample), #6 (02.14.25 sample), #7 (03.14.25 sample)  #9 (04.11.25 sample) #10 (05.23.25  sample), #11 (06.27.25 SAMPLE) #12 (08.01.25 sample)  - S/P PRP OS (09.20.19), (5.19.20), (08.19.20), (04.28.21), (02.02.22) - S/P PRP OD (9.27.19 and 11.21.19), fill-in (04.14.20) (09.03.20, surgery)  - S/P focal laser OS (07.06.21), OD (09.20.22) - FA (9.20.19) shows +NVE OU and leaking MA and capillary nonperfusion  - repeat  FA 11.15.19 shows NV regressing OU - pre-op: OD w/ VA down at 20/25, but there is some preretinal fibrosis / tractional membranes just superior to disc and mild central DME - s/p 25g PPV+MP+10% C3F8 gas OD (09.03.20) -- ERM/PRF removal OD  - BCVA: OD 20/30 from 20/25; OS 20/30 - stable             - fibrosis/ERM stably improved; retina attached - OCT shows OD: Persistent IRF/cystic changes temporal fovea--slightly improved; OS: Stable resolution of IRF/cystic changes temporal fovea, stable improvement in vitreous opacities at 6+ wks - recommend IVV OD #20 (PAP) and hold IVV OS today 01.07.26, with follow up in 6 weeks  -**OS on ~q22m maintenance treatment schedule -- due again ~Jan/Feb 2026**  - RBA of procedure discussed, questions answered - see procedure note - Eylea  informed consent form re-signed and scanned on 05.16.2023  - Vabysmo  informed consent form signed on 11.24.25 (OU) - [facility]ylea  HD informed consent obtained, resigned, and scanned on 10.25.24  - Eylea  HD approved for 2024 -- but no funding for Good Days  - f/u 6 weeks, DFE, OCT, possible injection(s)  5. Vitreous hemorrhage OS -- stably improved  - recurrent VH, onset 09.22.21 - etiology: secondary  to PDR as described above (no RT/RD on exam) - s/p PRP OS (9.20.19), (05.19.20), (08.19.20), (04.28.21), (02.02.22) - s/p IVA OS on 4.26.20, 5.29.20, 6.26.20, 8.5.20,11.11.20, 12.06.20 and so on as above   - Vit condensations stably improved  - BCVA stable at 20/25  - f/u 6 weeks DFE, OCT  6. History of Endophthalmitis OD  - s/p IVA OU 04/09/2018 - s/p 25g PPV w/ intravitreal vanc, ceftaz and cefepime  OD, 2.14.2020  - s/p intravitreal tap / vanc and ceflaz injections (02.16.20)             - gram stain (2.14.20) shows G+ cocci, WBCs mostly PMNs;   - repeat gram stain from t/i (2.16.20) -- no organisms, just WBCs  - cultures from vitreous grew rare Staph warneri; cultures from t/i -- no growth             - doing well, BCVA 20/25              - inflammation/posterior debris resolved  - monitor  7. History of Vitreous Hemorrhage OD -- cleared from PPV x2 for endophthalmitis and ERM/preretinal fibrosis   - secondary to PDR  8,9. Hypertensive retinopathy OU  - discussed importance of tight BP control.  - monitor  10. Combined form age related cataract OS - The symptoms of cataract, surgical options, and treatments and risks were discussed with patient.   - discussed diagnosis and progression  11. Pseudophakia OD  - s/p CE/IOL OD (Dr. CANDIE Gaudy, 12.11.20)  - beautiful surgery, doing well  Ophthalmic Meds Ordered this visit:  Meds ordered this encounter  Medications   faricimab -svoa (VABYSMO ) 6mg /0.17mL intravitreal injection     Return in about 6 weeks (around 04/16/2024) for PDR OU, DFE, OCT, poss IVV OU PAP.  There are no Patient Instructions on file for this visit.   This document serves as a record of services personally performed by Redell JUDITHANN Hans, MD, PhD. It was created on their behalf by Wanda GEANNIE Keens, COT an ophthalmic technician. The creation of this record is the provider's dictation and/or activities during the visit.    Electronically signed by:  Wanda GEANNIE Keens, COT  03/06/2024 4:42 PM  This document serves as a record of services personally performed by Redell JUDITHANN Hans, MD, PhD. It was created on their behalf by Almetta Pesa, an ophthalmic technician. The creation of this record is the provider's dictation and/or activities during the visit.    Electronically signed by: Almetta Pesa, OA, 03/06/2024  4:42 PM  Redell JUDITHANN Hans, M.D., Ph.D. Diseases & Surgery of the Retina and Vitreous [facility]  I have reviewed the above documentation for accuracy and completeness, and I agree with the above. Redell JUDITHANN Hans, M.D., Ph.D. 03/06/2024 4:44 PM   Abbreviations: M myopia (nearsighted); A astigmatism; H hyperopia (farsighted); P presbyopia; Mrx spectacle  prescription;  CTL contact lenses; OD right eye; OS left eye; OU both eyes  XT exotropia; ET esotropia; PEK punctate epithelial keratitis; PEE punctate epithelial erosions; DES dry eye syndrome; MGD meibomian gland dysfunction; ATs artificial tears; PFAT's preservative free artificial tears; NSC nuclear sclerotic cataract; PSC posterior subcapsular cataract; ERM epi-retinal membrane; PVD posterior vitreous detachment; RD retinal detachment; DM diabetes mellitus; DR diabetic retinopathy; NPDR non-proliferative diabetic retinopathy; PDR proliferative diabetic retinopathy; CSME clinically significant macular edema; DME diabetic macular edema; dbh dot blot hemorrhages; CWS cotton wool spot; POAG primary open angle glaucoma; C/D cup-to-disc ratio; HVF humphrey visual field; GVF goldmann visual  field; OCT optical coherence tomography; IOP intraocular pressure; BRVO Branch retinal vein occlusion; CRVO central retinal vein occlusion; CRAO central retinal artery occlusion; BRAO branch retinal artery occlusion; RT retinal tear; SB scleral buckle; PPV pars plana vitrectomy; VH Vitreous hemorrhage; PRP panretinal laser photocoagulation; IVK intravitreal kenalog ; VMT vitreomacular traction; MH Macular hole;  NVD neovascularization of the disc; NVE neovascularization elsewhere; AREDS age related eye disease study; ARMD age related macular degeneration; POAG primary open angle glaucoma; EBMD epithelial/anterior basement membrane dystrophy; ACIOL anterior chamber intraocular lens; IOL intraocular lens; PCIOL posterior chamber intraocular lens; Phaco/IOL phacoemulsification with intraocular lens placement; PRK photorefractive keratectomy; LASIK laser assisted in situ keratomileusis; HTN hypertension; DM diabetes mellitus; COPD chronic obstructive pulmonary disease "

## 2024-03-06 ENCOUNTER — Encounter (INDEPENDENT_AMBULATORY_CARE_PROVIDER_SITE_OTHER): Payer: Self-pay | Admitting: Ophthalmology

## 2024-03-06 MED ORDER — FARICIMAB-SVOA 6 MG/0.05ML IZ SOSY
6.0000 mg | PREFILLED_SYRINGE | INTRAVITREAL | Status: AC | PRN
  Administered 2024-03-06: 6 mg via INTRAVITREAL

## 2024-03-18 ENCOUNTER — Other Ambulatory Visit: Payer: Self-pay | Admitting: Family Medicine

## 2024-03-20 NOTE — Patient Instructions (Signed)
   It was very nice to see you today!   Please contact [facility] to schedule your repeat colonoscopy.   PLEASE NOTE:  If labs were collected or images ordered, we will inform you of  results once we have received them and reviewed. We will contact you either by echart message, or telephone call.  Please give ample time to the testing facility, and our office to run,  receive and review results. Please do not call inquiring of results, even if you can see them in your chart. We will contact you as soon as we are able. If it has been over 1 week since the test was completed, and you have not yet heard from us , then please call us .     - echart message- for normal results that have been seen by the patient already.   - telephone call: abnormal results or if patient has not viewed results in their echart.   If a referral to a specialist was entered for you, please call us  in 2 weeks if you have not heard from the specialist office to schedule.    Please try these tips to maintain a healthy lifestyle:  Eat most of your calories during the day when you are active. Eliminate processed foods including packaged sweets (pies, cakes, cookies), reduce intake of potatoes, white bread, white pasta, and white rice. Look for whole grain options, oat flour or almond flour.  Each meal should contain half fruits/vegetables, one quarter protein, and one quarter carbs (no bigger than a computer mouse).  Cut down on sweet beverages. This includes juice, soda, and sweet tea. Also watch fruit intake, though this is a healthier sweet option, it still contains natural sugar! Limit to 3 servings daily.  Drink at least 1 glass of water  with each meal and aim for at least 8 glasses per day  Exercise at least 150 minutes every week.

## 2024-03-21 ENCOUNTER — Encounter: Payer: Self-pay | Admitting: Family Medicine

## 2024-03-21 ENCOUNTER — Ambulatory Visit: Admitting: Family Medicine

## 2024-03-21 VITALS — BP 111/67 | HR 88 | Temp 96.9°F | Ht 64.25 in | Wt 158.0 lb

## 2024-03-21 DIAGNOSIS — Z7984 Long term (current) use of oral hypoglycemic drugs: Secondary | ICD-10-CM | POA: Diagnosis not present

## 2024-03-21 DIAGNOSIS — G894 Chronic pain syndrome: Secondary | ICD-10-CM

## 2024-03-21 DIAGNOSIS — Z0000 Encounter for general adult medical examination without abnormal findings: Secondary | ICD-10-CM

## 2024-03-21 DIAGNOSIS — E113593 Type 2 diabetes mellitus with proliferative diabetic retinopathy without macular edema, bilateral: Secondary | ICD-10-CM

## 2024-03-21 DIAGNOSIS — E7800 Pure hypercholesterolemia, unspecified: Secondary | ICD-10-CM | POA: Diagnosis not present

## 2024-03-21 DIAGNOSIS — M797 Fibromyalgia: Secondary | ICD-10-CM | POA: Diagnosis not present

## 2024-03-21 DIAGNOSIS — I10 Essential (primary) hypertension: Secondary | ICD-10-CM | POA: Diagnosis not present

## 2024-03-21 MED ORDER — HYDROCODONE-ACETAMINOPHEN 5-325 MG PO TABS: 1.0000 | ORAL_TABLET | Freq: Four times a day (QID) | ORAL | 0 refills | Status: AC | PRN

## 2024-03-21 NOTE — Progress Notes (Signed)
 "     Office Note 03/21/2024  CC:  Chief Complaint  Patient presents with   Annual Exam    Pt is fasting; reports recent mammogram (2025); recs requested   Patient is a 54 y.o. female who is here for annual health maintenance exam and 31-month follow-up hyperlipidemia, hypertension, and chronic pain. A/P as of last visit: #1 chronic pain syndrome.  Fibromyalgia stable on meloxicam  15 mg a day.  Continue gabapentin  300 mg 3 times daily. She takes Vicodin rarely.  Refill was not needed today. Monitor renal function today.   2.  Hypertension, well-controlled on amlodipine  5 mg daily, losartan  100 mg daily, and Toprol -XL 200 mg a day.   #3 mixed hyperlipidemia. LDL was 143 about 7 months ago, at which time we increased her rosuvastatin  to 20 mg a day. Fasting lipid panel and hepatic panel today.   #4 right ankle sprain. Limited MSK ultrasound today: No ankle effusion.  Peroneal tendons and extensor tendons intact and without fluid in sheath.  Anterior talofibular ligament with surrounding edematous changes but no clear disruption of fibers.  Recommended she continue walking boot for 1 more week and then start ankle lace up support as long as it is feeling improved. Will check ankle x-ray as well.   #5 orthostatic dizziness. Encouraged good hydration.  Compression hose recommended--prescription given today.   #6 DM managed by endo (Dr. Luana).   Patient reports great control lately without the use of any insulin.  Currently on Farxiga, Mounjaro, and metformin .  INTERIM HX: Gloria Gomez is doing very well. She has been able to get off her insulin. Mounjaro has helped well for weight loss. She is on 12.5 mg weekly as well as metformin  1000 mg twice daily and Farxiga 10 mg daily.  She has some scattered musculoskeletal pain/myofascial pain that has been well-controlled. She takes gabapentin  200 mg every night. She occasionally takes 1/2-1 Vicodin tab for severe pain, which typically is about  2-3 times a month. PMP AWARE reviewed today: most recent rx for Vicodin 5/325 was filled 09/26/2023, # 40, rx by me.  Notes recent gabapentin  prescription was filled 08/20/2023, #90, prescription by me. No red flags.  She has been able to decrease her Lasix  to 40 mg total daily dose, down from 60 mg.  She takes black cohosh for menopausal symptoms and this has been helping.  Past Medical History:  Diagnosis Date   Abdominal bloating    likely from diab gastroparesis.  Dr. Leigh started trial of reglan  03/2019.   Benign brain tumor (HCC)    Cystic lesion in cerebral aqueduct region with mild hydrocephalus-- stable MRI 02/2016.  Surveillance MRI 05/2017 --dilated cerebral aqueduct related to aqueductal stenosis and subsequent mild hydrocephalus (due to the 11 mm stable cystic lesion in cerebral aqueduct---?congenitial?.   Bilateral lower extremity edema    Cataract    OU   Dysmenorrhea    vicodin occ during first 2 days of cycle.   Fibromyalgia    Fibula fracture 07/2023   distal, nondisplaced (Right)   Gluten intolerance    pt reports she underwent full GI w/u to r/o celiac dz   Hepatic steatosis    ultrasound 08/2017. Hx of very mild elevation of ALT.  Stable on u/s 06/2020   History of adenomatous polyp of colon 04/08/2019   recall Feb 2024   Hyperlipidemia, mixed    Hypertension    +white coat component   Hypertensive retinopathy of both eyes    Insomnia  Iron deficiency 01/2019   Hb 11.3. Hemoccults neg x 3 03/19/19. EGD and colonoscopy 04/08/19 showed NO cause for IDA.  Pt does have menorrhagia, though, so she'll see her GYN.  started FeSO4 325 qd approx 04/14/19.   Menorrhagia    resulting in IDA 2021   Orthostatic hypotension    PMR (polymyalgia rheumatica) 2022   question of; hx of elevated ESR-->better with prednisone  but prednisone  was contraindicated d/t eye issues/hyperglycemia-->rheum started following her 02/2021->plaquenil 04/2021   Proliferative diabetic retinopathy  of both eyes (HCC)    steroid injections 10/2017--improved   Sensorineural hearing loss of left ear    Sudden left hearing loss summer 2016--no improvement with steroids 01/2015 so brain MRI done by Dr. Carlie and it showed brain tumor that was determined to be benign.  Pt's hearing not bad enough for hearing aid as of 06/2016.   Type 2 diabetes with complication (HCC)    +microalbuminuria, diab retpthy, diabetic gastroparesis (gastric emptying study mildly abnl 03/2017).  Recommended lantus  08/2018 but pt declined. Mild microalbuminuria.   Uterine fibroid 2022   per pt report, pelvic u/s in GYN office    Past Surgical History:  Procedure Laterality Date   ANOSCOPY  05/12/2019   Procedure: normal exam, minimal hemorrhoid disease. Hyertrophied anal papila, benign appearing, posterior midline. Surgeon: Bernarda Ned MD   CHOLECYSTECTOMY  2000   COLONOSCOPY  04/08/2019   5 adenomas, recall 3 yrs; no cause for IDA found.  Hypertrophied anal papillae->bx showed low grade dysplasia; GI referred her to colorectal surgeon.   ESOPHAGOGASTRODUODENOSCOPY  04/08/2019   mild chronic reactive gastritis. H pylori NEG.  No cause for IDA found.   GAS INSERTION Right 10/31/2018   Procedure: Insertion Of C3F8 Gas;  Surgeon: Valdemar Rogue, MD;  Location: [facility] OR;  Service: Ophthalmology;  Laterality: Right;   GASTRIC EMPTYING SCAN  04/20/2017   Mildly abnormal, particularly the 1st hour of emptying.   MEMBRANE PEEL Right 10/31/2018   Procedure: MEMBRANE PEEL;  Surgeon: Valdemar Rogue, MD;  Location: [facility] OR;  Service: Ophthalmology;  Laterality: Right;   PARS PLANA VITRECTOMY Right 04/12/2018   Procedure: Right PARS PLANA VITRECTOMY WITH 25 GAUGE with intravitreal antibiotics;  Surgeon: Valdemar Rogue, MD;  Location: [facility] OR;  Service: Ophthalmology;  Laterality: Right;   PARS PLANA VITRECTOMY Right 10/31/2018   Procedure: PARS PLANA VITRECTOMY WITH 25 GAUGE;  Surgeon: Valdemar Rogue, MD;  Location: [facility] OR;  Service:  Ophthalmology;  Laterality: Right;   PHOTOCOAGULATION WITH LASER Right 10/31/2018   Procedure: Photocoagulation With Laser;  Surgeon: Valdemar Rogue, MD;  Location: [facility] OR;  Service: Ophthalmology;  Laterality: Right;   TRANSTHORACIC ECHOCARDIOGRAM  08/23/2020   Grd I DD, o/w normal.    Family History  Problem Relation Age of Onset   Brain cancer Mother    Diabetes Father    Diabetes Maternal Grandmother    Cataracts Maternal Grandmother    Cervical cancer Paternal Grandmother    Colon cancer Maternal Grandfather 57   Amblyopia Neg Hx    Blindness Neg Hx    Glaucoma Neg Hx    Macular degeneration Neg Hx    Retinal detachment Neg Hx    Strabismus Neg Hx    Retinitis pigmentosa Neg Hx    Esophageal cancer Neg Hx    Stomach cancer Neg Hx    Rectal cancer Neg Hx     Social History   Socioeconomic History   Marital status: Married    Spouse name: Not on file  Number of children: 0   Years of education: Not on file   Highest education level: Not on file  Occupational History   Occupation: unemployed  Tobacco Use   Smoking status: Never   Smokeless tobacco: Never  Vaping Use   Vaping status: Never Used  Substance and Sexual Activity   Alcohol use: No   Drug use: No   Sexual activity: Yes  Other Topics Concern   Not on file  Social History Narrative   Married, no children..   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       Occup: Town Production Designer, Theatre/television/film for [employer].   No T/A/Ds.   Social Drivers of Health   Tobacco Use: Low Risk (03/21/2024)   Patient History    Smoking Tobacco Use: Never    Smokeless Tobacco Use: Never    Passive Exposure: Not on file  Financial Resource Strain: Not on file  Food Insecurity: No Food Insecurity (11/28/2023)   Epic    Worried About Programme Researcher, Broadcasting/film/video in the Last Year: Never true    Ran Out of Food in the Last Year: Never true  Transportation Needs: No Transportation Needs (11/28/2023)   Epic    Lack of  Transportation (Medical): No    Lack of Transportation (Non-Medical): No  Physical Activity: Not on file  Stress: Stress Concern Present (11/21/2023)   Received from [facility] of Occupational Health - Occupational Stress Questionnaire    Do you feel stress - tense, restless, nervous, or anxious, or unable to sleep at night because your mind is troubled all the time - these days?: To some extent  Social Connections: Not on file  Intimate Partner Violence: Not At Risk (11/28/2023)   Epic    Fear of Current or Ex-Partner: No    Emotionally Abused: No    Physically Abused: No    Sexually Abused: No  Depression (PHQ2-9): Low Risk (03/21/2024)   Depression (PHQ2-9)    PHQ-2 Score: 1  Alcohol Screen: Not on file  Housing: Unknown (11/28/2023)   Epic    Unable to Pay for Housing in the Last Year: No    Number of Times Moved in the Last Year: Not on file    Homeless in the Last Year: No  Utilities: Not At Risk (11/28/2023)   Epic    Threatened with loss of utilities: No  Health Literacy: Not on file    Outpatient Medications Prior to Visit  Medication Sig Dispense Refill   BLACK COHOSH EXTRACT PO Take 1 tablet by mouth daily.     Continuous Blood Gluc Sensor (FREESTYLE LIBRE 3 SENSOR) MISC by Does not apply route every 14 (fourteen) days.     FARXIGA 10 MG TABS tablet Take 10 mg by mouth daily.     furosemide  (LASIX ) 20 MG tablet TAKE 2 TABLETS BY MOUTH IN THE MORNING AND 1 IN THE EVENING (Patient taking differently: Take 40 mg by mouth daily. TAKE 1 TABLET BY MOUTH IN THE MORNING AND 1 IN THE EVENING) 60 tablet 0   gabapentin  (NEURONTIN ) 100 MG capsule Take 200 mg by mouth at bedtime.     losartan  (COZAAR ) 100 MG tablet Take 1 tablet (100 mg total) by mouth daily. 90 tablet 3   metFORMIN  (GLUCOPHAGE ) 1000 MG tablet Take 1 tablet (1,000 mg total) by mouth 2 (two) times daily with a meal. 180 tablet 3   MOUNJARO 12.5 MG/0.5ML Pen Inject 12.5 mg into the skin  once a  week.     Multiple Vitamin (MULTIVITAMIN WITH MINERALS) TABS tablet Take 1 tablet by mouth daily.     pantoprazole  (PROTONIX ) 40 MG tablet Take 1 tablet (40 mg total) by mouth daily. 90 tablet 3   promethazine  (PHENERGAN ) 12.5 MG tablet TAKE 1 TO 2 TABLETS BY MOUTH EVERY 6 HOURS AS NEEDED FOR NAUSEA 30 tablet 3   rosuvastatin  (CRESTOR ) 40 MG tablet Take 1 tablet (40 mg total) by mouth daily. 30 tablet 2   verapamil  (CALAN -SR) 240 MG CR tablet Take by mouth.     gabapentin  (NEURONTIN ) 300 MG capsule TAKE 1 CAPSULE BY MOUTH THREE TIMES DAILY (Patient taking differently: Take 300 mg by mouth 3 (three) times daily. Patient taking once a day) 90 capsule 0   HYDROcodone -acetaminophen  (NORCO/VICODIN) 5-325 MG tablet Take 1-2 tablets by mouth every 6 (six) hours as needed for moderate pain (pain score 4-6). 40 tablet 0   HUMALOG KWIKPEN 100 UNIT/ML KwikPen Inject into the skin.     insulin aspart (NOVOLOG) 100 UNIT/ML injection Inject 10 Units into the skin 2 (two) times daily with a meal. Per endocrinologist     Insulin Glargine  (BASAGLAR KWIKPEN) 100 UNIT/ML 10 units every morning and 24 units nightly     meloxicam  (MOBIC ) 15 MG tablet Take 1 tablet daily as needed. 30 tablet 0   metoCLOPramide  (REGLAN ) 5 MG tablet TAKE 1 TABLET BY MOUTH 4 TIMES DAILY BEFORE MEAL(S) AND AT BEDTIME 360 tablet 1   NOVOLOG FLEXPEN 100 UNIT/ML FlexPen      No facility-administered medications prior to visit.    Allergies[1]  Review of Systems  Constitutional:  Negative for appetite change, chills, fatigue and fever.  HENT:  Negative for congestion, dental problem, ear pain and sore throat.   Eyes:  Negative for discharge, redness and visual disturbance.  Respiratory:  Negative for cough, chest tightness, shortness of breath and wheezing.   Cardiovascular:  Negative for chest pain, palpitations and leg swelling.  Gastrointestinal:  Negative for abdominal pain, blood in stool, diarrhea, nausea and vomiting.   Genitourinary:  Negative for difficulty urinating, dysuria, flank pain, frequency, hematuria and urgency.  Musculoskeletal:  Negative for arthralgias, back pain, joint swelling, myalgias and neck stiffness.  Skin:  Negative for pallor and rash.  Neurological:  Negative for dizziness, speech difficulty, weakness and headaches.  Hematological:  Negative for adenopathy. Does not bruise/bleed easily.  Psychiatric/Behavioral:  Negative for confusion and sleep disturbance. The patient is not nervous/anxious.     PE;    03/21/2024    1:13 PM 01/16/2024    2:30 PM 11/28/2023   11:10 AM  Vitals with BMI  Height 5' 4.25 5' 4   Weight 158 lbs 161 lbs 161 lbs  BMI 26.91 27.62 27.62  Systolic 111  135  Diastolic 67  80  Pulse 88  90    Gen: Alert, well appearing.  Patient is oriented to person, place, time, and situation. AFFECT: pleasant, lucid thought and speech. ENT: Ears: EACs clear, normal epithelium.  TMs with good light reflex and landmarks bilaterally.  Eyes: no injection, icteris, swelling, or exudate.  EOMI, PERRLA. Nose: no drainage or turbinate edema/swelling.  No injection or focal lesion.  Mouth: lips without lesion/swelling.  Oral mucosa pink and moist.  Dentition intact and without obvious caries or gingival swelling.  Oropharynx without erythema, exudate, or swelling.  Neck: supple/nontender.  No LAD, mass, or TM.  Carotid pulses 2+ bilaterally, without bruits. CV: RRR, no m/r/g.  LUNGS: CTA bilat, nonlabored resps, good aeration in all lung fields. ABD: soft, NT, ND, BS normal.  No hepatospenomegaly or mass.  No bruits. EXT: no clubbing, cyanosis, or edema.  Left lower leg is subtly larger than the right (her baseline) Musculoskeletal: no joint swelling, erythema, warmth, or tenderness.  ROM of all joints intact. Skin - no sores or suspicious lesions or rashes or color changes  Pertinent labs:  Lab Results  Component Value Date   TSH 1.15 02/19/2023   Lab Results   Component Value Date   WBC 10.3 02/08/2022   HGB 12.0 02/08/2022   HCT 35.9 02/08/2022   MCV 82.5 02/08/2022   PLT 410 (H) 02/08/2022   Lab Results  Component Value Date   CREATININE 1.0 11/27/2023   BUN 24 (A) 11/27/2023   NA 141 11/27/2023   K 5.0 11/27/2023   CL 104 11/27/2023   CO2 21 11/27/2023   Lab Results  Component Value Date   ALT 27 11/27/2023   AST 18 11/27/2023   ALKPHOS 95 11/27/2023   BILITOT 0.3 08/20/2023   Lab Results  Component Value Date   CHOL 217 (H) 08/20/2023   Lab Results  Component Value Date   HDL 41.10 08/20/2023   Lab Results  Component Value Date   LDLCALC 122 (H) 08/20/2023   Lab Results  Component Value Date   TRIG 272.0 (H) 08/20/2023   Lab Results  Component Value Date   CHOLHDL 5 08/20/2023   Lab Results  Component Value Date   HGBA1C 6.9 11/27/2023   ASSESSMENT AND PLAN:   #1 Health maintenance exam: Reviewed age and gender appropriate health maintenance issues (prudent diet, regular exercise, health risks of tobacco and excessive alcohol, use of seatbelts, fire alarms in home, use of sunscreen).  Also reviewed age and gender appropriate health screening as well as vaccine recommendations. Vaccines:  All up-to-date. Labs: c-Met, TSH, lipid panel, cbc, urine microalbumin/creatinine. Cervical ca screening: per GYN MD Breast ca screening: UTD via [facility]. Cerv ca scr:  [facility].  Will get records. Colon ca screening: hx multiple polyps, recall as of 03/2022.  She'll call GI and schedule.  Contact info given to pt today.  #2 chronic pain syndrome.  Fibromyalgia stable on meloxicam  15 mg a day.  Continue gabapentin  300 mg 3 times daily. She takes Vicodin rarely.  New prescription for #40 today. Monitor renal function today.   2.  Hypertension, well-controlled on losartan  100 mg daily. Basic metabolic panel today.  #3 mixed hyperlipidemia. LDL was 122 about 7 months ago, at which time we  increased her rosuvastatin  to 40 mg a day. Fasting lipid panel and hepatic panel today.   #4 DM managed by endo (Dr. Luana).   Currently on Farxiga, Mounjaro, and metformin . Feet exam normal today. Urine microalbumin/creatinine today.  An After Visit Summary was printed and given to the patient.  FOLLOW UP:  Return in about 6 months (around 09/18/2024) for routine chronic illness f/u.  Signed:  Gerlene Hockey, MD           03/21/2024     [1]  Allergies Allergen Reactions   Gluten Meal Swelling   Lisinopril  Cough   Pioglitazone  Other (See Comments)    ELEVATED glucoses + worse chronic nausea   "

## 2024-03-22 LAB — COMPREHENSIVE METABOLIC PANEL WITH GFR
AG Ratio: 2 (calc) (ref 1.0–2.5)
ALT: 22 U/L (ref 6–29)
AST: 17 U/L (ref 10–35)
Albumin: 4.6 g/dL (ref 3.6–5.1)
Alkaline phosphatase (APISO): 76 U/L (ref 37–153)
BUN: 25 mg/dL (ref 7–25)
CO2: 26 mmol/L (ref 20–32)
Calcium: 10 mg/dL (ref 8.6–10.4)
Chloride: 106 mmol/L (ref 98–110)
Creat: 0.97 mg/dL (ref 0.50–1.03)
Globulin: 2.3 g/dL (ref 1.9–3.7)
Glucose, Bld: 94 mg/dL (ref 65–99)
Potassium: 4.9 mmol/L (ref 3.5–5.3)
Sodium: 143 mmol/L (ref 135–146)
Total Bilirubin: 0.2 mg/dL (ref 0.2–1.2)
Total Protein: 6.9 g/dL (ref 6.1–8.1)
eGFR: 69 mL/min/{1.73_m2}

## 2024-03-22 LAB — MICROALBUMIN / CREATININE URINE RATIO
Creatinine, Urine: 72 mg/dL (ref 20–275)
Microalb Creat Ratio: 208 mg/g{creat} — ABNORMAL HIGH
Microalb, Ur: 15 mg/dL

## 2024-03-22 LAB — CBC WITH DIFFERENTIAL/PLATELET
Absolute Lymphocytes: 2350 {cells}/uL (ref 850–3900)
Absolute Monocytes: 498 {cells}/uL (ref 200–950)
Basophils Absolute: 71 {cells}/uL (ref 0–200)
Basophils Relative: 0.8 %
Eosinophils Absolute: 303 {cells}/uL (ref 15–500)
Eosinophils Relative: 3.4 %
HCT: 38.5 % (ref 35.9–46.0)
Hemoglobin: 12.2 g/dL (ref 11.7–15.5)
MCH: 26.7 pg — ABNORMAL LOW (ref 27.0–33.0)
MCHC: 31.7 g/dL (ref 31.6–35.4)
MCV: 84.2 fL (ref 81.4–101.7)
MPV: 9.9 fL (ref 7.5–12.5)
Monocytes Relative: 5.6 %
Neutro Abs: 5678 {cells}/uL (ref 1500–7800)
Neutrophils Relative %: 63.8 %
Platelets: 414 10*3/uL — ABNORMAL HIGH (ref 140–400)
RBC: 4.57 Million/uL (ref 3.80–5.10)
RDW: 14.8 % (ref 11.0–15.0)
Total Lymphocyte: 26.4 %
WBC: 8.9 10*3/uL (ref 3.8–10.8)

## 2024-03-22 LAB — TSH: TSH: 1.46 m[IU]/L

## 2024-03-22 LAB — LIPID PANEL
Cholesterol: 231 mg/dL — ABNORMAL HIGH
HDL: 49 mg/dL — ABNORMAL LOW
LDL Cholesterol (Calc): 150 mg/dL — ABNORMAL HIGH
Non-HDL Cholesterol (Calc): 182 mg/dL — ABNORMAL HIGH
Total CHOL/HDL Ratio: 4.7 (calc)
Triglycerides: 182 mg/dL — ABNORMAL HIGH

## 2024-03-24 ENCOUNTER — Ambulatory Visit: Payer: Self-pay | Admitting: Family Medicine

## 2024-03-25 ENCOUNTER — Telehealth: Payer: Self-pay

## 2024-03-25 NOTE — Telephone Encounter (Signed)
LVM for pt to return call to further discuss

## 2024-03-25 NOTE — Telephone Encounter (Signed)
 Spoke with pt, advised the verbiage malignant neoplasm is another way of saying breast cancer which we are doing the mammogram to screen for. Pt voiced understanding.

## 2024-03-25 NOTE — Telephone Encounter (Signed)
 Copied from CRM #[id number]. Topic: General - Other >> Mar 21, 2024  4:01 PM Alfonso HERO wrote: Reason for CRM: patient calling to ask about the phrasing of her mammogram on the after visit summary. Asking for a call back

## 2024-04-03 ENCOUNTER — Other Ambulatory Visit: Payer: Self-pay | Admitting: Family Medicine

## 2024-04-09 ENCOUNTER — Ambulatory Visit

## 2024-04-16 ENCOUNTER — Encounter (INDEPENDENT_AMBULATORY_CARE_PROVIDER_SITE_OTHER): Admitting: Ophthalmology

## 2024-09-18 ENCOUNTER — Ambulatory Visit: Admitting: Family Medicine
